# Patient Record
Sex: Male | Born: 1935
Health system: Southern US, Community
[De-identification: ages and names within clinical notes are randomized; demographics above are authoritative.]

## PROBLEM LIST (undated history)

## (undated) DIAGNOSIS — I2699 Other pulmonary embolism without acute cor pulmonale: Secondary | ICD-10-CM

## (undated) DIAGNOSIS — I10 Essential (primary) hypertension: Secondary | ICD-10-CM

## (undated) DIAGNOSIS — J189 Pneumonia, unspecified organism: Secondary | ICD-10-CM

## (undated) DIAGNOSIS — M549 Dorsalgia, unspecified: Secondary | ICD-10-CM

## (undated) DIAGNOSIS — K829 Disease of gallbladder, unspecified: Secondary | ICD-10-CM

## (undated) DIAGNOSIS — K255 Chronic or unspecified gastric ulcer with perforation: Secondary | ICD-10-CM

## (undated) DIAGNOSIS — K5903 Drug induced constipation: Secondary | ICD-10-CM

## (undated) DIAGNOSIS — Z974 Presence of external hearing-aid: Secondary | ICD-10-CM

## (undated) DIAGNOSIS — E119 Type 2 diabetes mellitus without complications: Secondary | ICD-10-CM

## (undated) DIAGNOSIS — G8929 Other chronic pain: Secondary | ICD-10-CM

## (undated) DIAGNOSIS — N289 Disorder of kidney and ureter, unspecified: Secondary | ICD-10-CM

## (undated) DIAGNOSIS — G4730 Sleep apnea, unspecified: Secondary | ICD-10-CM

## (undated) DIAGNOSIS — Z972 Presence of dental prosthetic device (complete) (partial): Secondary | ICD-10-CM

## (undated) DIAGNOSIS — F4310 Post-traumatic stress disorder, unspecified: Secondary | ICD-10-CM

## (undated) DIAGNOSIS — E7800 Pure hypercholesterolemia, unspecified: Secondary | ICD-10-CM

## (undated) DIAGNOSIS — Z973 Presence of spectacles and contact lenses: Secondary | ICD-10-CM

## (undated) DIAGNOSIS — K219 Gastro-esophageal reflux disease without esophagitis: Secondary | ICD-10-CM

## (undated) DIAGNOSIS — K08109 Complete loss of teeth, unspecified cause, unspecified class: Secondary | ICD-10-CM

## (undated) DIAGNOSIS — T402X5A Adverse effect of other opioids, initial encounter: Secondary | ICD-10-CM

## (undated) DIAGNOSIS — M1990 Unspecified osteoarthritis, unspecified site: Secondary | ICD-10-CM

## (undated) HISTORY — DX: Essential (primary) hypertension: I10

## (undated) HISTORY — DX: Chronic or unspecified gastric ulcer with perforation: K25.5

## (undated) HISTORY — PX: OTHER SURGICAL HISTORY: SHX169

## (undated) HISTORY — PX: ANKLE SURGERY: SHX546

## (undated) HISTORY — PX: COLONOSCOPY: SHX174

## (undated) HISTORY — DX: Pure hypercholesterolemia, unspecified: E78.00

## (undated) HISTORY — PX: BACK SURGERY: SHX140

## (undated) HISTORY — PX: ROTATOR CUFF REPAIR: SHX139

---
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
## 1999-07-05 ENCOUNTER — Emergency Department (HOSPITAL_COMMUNITY): Admission: EM | Admit: 1999-07-05 | Discharge: 1999-07-05 | Payer: Self-pay | Admitting: Emergency Medicine

## 2001-02-20 ENCOUNTER — Ambulatory Visit (HOSPITAL_BASED_OUTPATIENT_CLINIC_OR_DEPARTMENT_OTHER): Admission: RE | Admit: 2001-02-20 | Discharge: 2001-02-21 | Payer: Self-pay | Admitting: Orthopedic Surgery

## 2005-02-10 ENCOUNTER — Encounter: Admission: RE | Admit: 2005-02-10 | Discharge: 2005-02-10 | Payer: Self-pay | Admitting: Family Medicine

## 2005-05-03 ENCOUNTER — Encounter: Admission: RE | Admit: 2005-05-03 | Discharge: 2005-05-03 | Payer: Self-pay | Admitting: Orthopedic Surgery

## 2005-05-11 ENCOUNTER — Encounter (INDEPENDENT_AMBULATORY_CARE_PROVIDER_SITE_OTHER): Payer: Self-pay | Admitting: Specialist

## 2005-05-11 ENCOUNTER — Ambulatory Visit (HOSPITAL_COMMUNITY): Admission: RE | Admit: 2005-05-11 | Discharge: 2005-05-12 | Payer: Self-pay | Admitting: Orthopedic Surgery

## 2007-11-03 ENCOUNTER — Emergency Department (HOSPITAL_COMMUNITY): Admission: EM | Admit: 2007-11-03 | Discharge: 2007-11-03 | Payer: Self-pay | Admitting: Emergency Medicine

## 2008-11-12 ENCOUNTER — Encounter: Admission: RE | Admit: 2008-11-12 | Discharge: 2008-11-12 | Payer: Self-pay | Admitting: Orthopedic Surgery

## 2008-11-13 ENCOUNTER — Ambulatory Visit (HOSPITAL_BASED_OUTPATIENT_CLINIC_OR_DEPARTMENT_OTHER): Admission: RE | Admit: 2008-11-13 | Discharge: 2008-11-14 | Payer: Self-pay | Admitting: Orthopedic Surgery

## 2010-12-09 LAB — POCT I-STAT, CHEM 8
BUN: 39 mg/dL — ABNORMAL HIGH (ref 6–23)
Chloride: 109 mEq/L (ref 96–112)
Creatinine, Ser: 1.3 mg/dL (ref 0.4–1.5)
Glucose, Bld: 88 mg/dL (ref 70–99)
Hemoglobin: 14.6 g/dL (ref 13.0–17.0)
Sodium: 140 mEq/L (ref 135–145)
TCO2: 24 mmol/L (ref 0–100)

## 2010-12-09 LAB — BASIC METABOLIC PANEL
BUN: 32 mg/dL — ABNORMAL HIGH (ref 6–23)
CO2: 24 mEq/L (ref 19–32)
Calcium: 9.1 mg/dL (ref 8.4–10.5)
Chloride: 105 mEq/L (ref 96–112)

## 2011-01-11 NOTE — Op Note (Signed)
NAMEKNOXX, BAETZ               ACCOUNT NO.:  [id number]   MEDICAL RECORD NO.:  [id number]          PATIENT TYPE:  AMB   LOCATION:  [place]                          FACILITY:  [facility]   PHYSICIAN:  Youlanda Mighty. Sypher, M.D. DATE OF BIRTH:  06/10/1932   DATE OF PROCEDURE:  11/13/2008  DATE OF DISCHARGE:                               OPERATIVE REPORT   PREOPERATIVE DIAGNOSES:  Chronic left shoulder pain due to a number of  pathologies including acromioclavicular degenerative arthritis, adhesive  capsulitis and bursitis, labral degenerative tear, partial-thickness  rotator cuff tear involving the supraspinatus tendon and rule out  bicipital tendinopathy.   POSTOPERATIVE DIAGNOSES:  1. Confirmed labral degenerative tearing with a superior labrum      anterior and posterior I type tear.  2. A 30% superior subscapularis degenerative tear.  3. A 50% mid substance watershed partial-thickness articular-surface      tendon avulsion tear of supraspinatus.  4. Adhesive capsulitis with tenodesis of subscapularis.  5. Florid subacromial bursitis with unfavorable acromial and      acromioclavicular anatomy.   OPERATION:  1. Examination of left shoulder under anesthesia.  2. Diagnostic arthroscopy followed by arthroscopic debridement of      labrum, subscapularis, supraspinatus and tenolysis of subscapularis      and anterior capsulectomy.  3. Arthroscopic subacromial decompression with bursectomy,      coracoacromial ligament relaxation, and acromioplasty.  4. Arthroscopic resection of distal clavicle.   OPERATIONS:  Youlanda Mighty. Sypher, MD   ASSISTANT:  Marily Lente Dasnoit, PA-C   ANESTHESIA:  General endotracheal supplemented by a left interscalene  block.   SUPERVISING ANESTHESIOLOGIST:  Dr. Glennon Mac.   INDICATIONS:  Jaidan Fagot is a 77 year old gentleman referred through  the courtesy of Dr. Juanita Craver for evaluation and management of left  shoulder pain.  He is status post decompression  of the right shoulder in  2002.  Mr. Garneau has a number of coexisting pathologies including  diabetes, probable sleep apnea, chronic degenerative arthritis,  hypertension, and atherosclerotic cardiovascular disease.  He has never  been a smoker.  He had progressive left shoulder pain.  Plain films and  MRI documented AC arthropathy, unfavorable AC contour, and reactive  changes of the greater tuberosity consistent with a rotator cuff tear.  The MRI confirmed a partial-thickness rotator cuff tear, subscapularis  tendinopathy, and bicipital tendinopathy.   Due to failed respond to nonoperative measures, he is brought to the  operating room at this time anticipating diagnostic arthroscopy,  debridement, subacromial decompression, distal clavicle resection, and  possible repair of the rotator cuff.   We informed Mr. Whitted that we would correct all identified  intraarticular pathology at the time of surgery.   He understood that we might proceed with an open rotator cuff repair if  a full-thickness tear was identified.   Questions are invited and answered in detail.   Preoperatively, he was seen for an anesthesia consult by Dr. Glennon Mac.  His multiple morbidities were assessed and an interscalene block was  placed without complication.   He was brought to room #8 of the [facility], placed in the  supine position upon operating table, and under Dr. Marisue Brooklyn direct  supervision general endotracheal anesthesia was induced.  He was  carefully positioned in the beach-chair position with the aid of a torso  and head holder designed for shoulder arthroscopy.  The entire left  upper extremity and forequarter were prepped with DuraPrep and draped  with impervious arthroscopy drapes.  Examination of shoulder under  anesthesia revealed stability.  His combined elevation was 160 degrees,  external rotation 75, and internal rotation of 50.  He had mild adhesive  capsulitis, most notable  with external rotation and abduction above 90  degrees.   The arthroscope was introduced with a switching stick from an anterior  approach with the scope being placed in the posterior viewing portal.  Diagnostic arthroscopy revealed abundant granulation tissue consistent  with adhesive capsulitis and considerable adhesions of the subscapularis  tendon.  The biceps had a partial tear.  The subscapularis had a 30%  superior degenerative tear that was retracted and fibular.  The  supraspinatus had a 50% deep surface watershed tear.  This was not full  thickness.  There was no significant pathology of the infraspinatus or  teres minor.   An anterior portal was created under direct vision followed by use of a  suction shaver to debride the labrum to a stable margin.  The biceps to  a stable margin of subscapularis to stable fiber and the deep surface of  supraspinatus.  There was not a significant degree of glenohumeral  degenerative arthritis noted.  An anterior capsulotomy was performed  with the cutting cautery followed by suction shaving of the redundant  tissues and hemostasis with bipolar cautery.  This allowed much freer  motion of the subscapularis and external rotation was recovered.  The  scope was removed from the glenohumeral joint and placed in the  subacromial space.  A florid bursitis was identified.  After extensive  bursal debridement, the anatomy of the coracoacromial arch was studied.  The capsule of the [facility] joint had been violated and was prominent abrading  the rotator cuff.  The capsule was removed with the cutting cautery  followed by debridement with a suction shaver and resection of the  distal 15 mm clavicle with suction bur.  The acromion was leveled to a  type 1 morphology and the coracoacromial ligament was relaxed.  Hemostasis was achieved with bipolar cautery.  After a thorough  bursectomy, the bursal side of the cuff was inspected.  There was no  sign of  full-thickness tear.   After hemostasis was achieved, the arthroscopic probe was removed.  Mr.  Benzing was placed in a sling, awakened from general anesthesia, and  transferred to recovery room with stable signs.   Due to his multiple pathologies, we will observe him overnight on  supplemental oxygen in the [place].  There were no apparent  complications.     Youlanda Mighty Sypher, M.D.  Electronically Signed    RVS/MEDQ  D:  11/13/2008  T:  11/14/2008  Job:  [id number]   cc:   Juanita Craver, M.D.

## 2011-01-14 NOTE — Op Note (Signed)
[name], [name]               ACCOUNT NO.:  [id number]   MEDICAL RECORD NO.:  [id number]          PATIENT TYPE:  OIB   LOCATION:  [id number]                         FACILITY:  [facility]   PHYSICIAN:  Tarri Glenn, M.D.  DATE OF BIRTH:  1932-04-06   DATE OF PROCEDURE:  05/11/2005  DATE OF DISCHARGE:                                 OPERATIVE REPORT   PREOPERATIVE DIAGNOSIS:  Lateral recess stenosis and herniated nucleus  pulposus L4-5 status post two prior back surgeries with central  decompression at L4-5.   POSTERIOR DIAGNOSIS:  Lateral recess stenosis and herniated nucleus pulposus  L4-5 status post two prior back surgeries with central decompression at L4-  5.   OPERATION:  Decompression lateral recess and excision of herniated nucleus  pulposus at L4-5 left.   SURGEON:  Tarri Glenn, M.D.   ASSISTANT:  Susa Day, M.D.   ANESTHESIA:  General.   INDICATION FOR PROCEDURE:  The two prior back surgeries were performed in  1980 and 1991. More recently, he has had severe back and left leg pain. A  myelogram CT scan has demonstrated a significant deformation of the column  at L4-5 on the left associated with suspected HNP at L4-5 on the CT scan.  Accordingly, he is here for the above-mentioned surgery.   PROCEDURE:  Prophylactic antibiotic, satisfactory general anesthesia, knee-  chest position on the Foley frame. Back was prepped with DuraPrep, draped  in a sterile field. I went through the old surgical incision and, based on  the CT scan where he had had central decompression with still a little  remnant of L4 remaining, I tagged with a Kocher clamp what we thought was  the residual spinous process of L4 and put another spine clamp at the mid  portion of my incision. A lateral x-ray was taken indicating that we were  indeed at the inferior margin of the previous central decompression at L4  and that the Kocher clamp in the mid portion of the incision was at the L4-5  disk space. I placed self-retaining McCullough retractors after dissecting  soft tissue adequately to either side and, working off the remaining  residual bone at L4 with curette and double-action rongeur, we worked down  the lateral recess of bone. We were able to eventually in several areas work  through the previous decompression margin with a 2 mm Kerrison rongeur and  began decompressing the lateral recess. Also used 1/4 and 1/2 inch  osteotomes for additional bone removal. We eventually located the L5 nerve  root. The major defect was right at the level of the interspace. After  removing enough lateral bone and packed all we could really get at because  we were at the level the pedicle, were able to identify the L5 nerve root,  free it up in the foramen, and locate beneath it a large boil-type appearing  disk fragment, perhaps even the disk itself. I was able to milk this out  with Penfield-4 and micro pituitary removing a considerable amount of disk  material which left the L5 nerve root lying free and the underlying  disk  appearing to be very firm. At this point, we were satisfied that we had  thoroughly decompressed the lateral recess, the L5 nerve root, and remove  the offending disk material. The wound was irrigated well with sterile  saline. There was no unusual bleeding and no dural tear. He was given 30 mg  of Toradol IV. I closed the fascia with interrupted #1 Vicryl, as well as  the deep subcutaneous tissue, superficial subcu tissue was infiltrated with  0.5% plain Marcaine, and continued the  closure with 2-0 Vicryl superficially and staples in the skin. Betadine and  Adaptic dressing were applied. He was gently placed on his PACU bed and  taken to the recovery room in satisfactory condition with no known  complications.           ______________________________  Tarri Glenn, M.D.     JA/MEDQ  D:  05/11/2005  T:  05/12/2005  Job:  [id number]

## 2011-01-14 NOTE — Op Note (Signed)
[place]  Patient:    [name], [name]                      MRN: [id number] Proc. Date: 02/20/01 Adm. Date:  [id number] Attending:  Isaac Laud                           Operative Report  PREOPERATIVE DIAGNOSIS:  Chronic pain, right shoulder with clinical examination consistent with internal derangement and early degenerative arthritis, right glenohumeral joint with chronic stage 2 impingement and acromioclavicular arthropathy.  POSTOPERATIVE DIAGNOSIS:  Chronic pain, right shoulder with clinical examination consistent with internal derangement and early degenerative arthritis, right glenohumeral joint with chronic stage 2 impingement and acromioclavicular arthropathy.  Identification of a large anterior labral tear impinging within the joint, grade 2 and 3 chondromalacia of the humeral head, grade 2 and 3 chondromalacia of glenoid fossa and intact rotator cuff and biceps tendon.  OPERATION PERFORMED: 1. Diagnostic arthroscopy, right glenohumeral joint followed by arthroscopic    debridement of extensive superior and inferior labral tear. 2. Subacromial decompression with acromioplasty, bursectomy and coracoacromial    ligament release. 3. Arthroscopic distal clavicle resection.  SURGEON:  Youlanda Mighty. Sypher, Brooke Bonito., M.D.  ASSISTANT:  Julian Reil, P.A.  ANESTHESIA:  General orotracheal.  SUPERVISING ANESTHESIOLOGIST:  Dr. Albertina Parr.  INDICATIONS FOR PROCEDURE:  The patient is a 77 year old man who presented for evaluation of chronic right shoulder pain.  Clinical examination revealed signs of impingement, acromioclavicular arthropathy and on plain films, he was noted to have glenohumeral joint degenerative change.  An MRI of the shoulder documented degenerative change in the glenohumeral joint and AC joints, a mass in the anterior glenohumeral joint adjacent to the superior labrum and biceps tendon consistent with a  possible cyst or degenerative labral tear. Arrangements were made for diagnostic arthroscopy at this time followed by appropriate intervention.  [name] was anticipating resection of the distal clavicle.  DESCRIPTION OF PROCEDURE:  [name] was brought to the operating room and placed in supine position on the operating table.  Following induction of general orotracheal anesthesia, he was carefully positioned in a beach chair position with the aid of torso and head holder designed for shoulder arthropathy.  The entire right upper extremity and forequarter were prepped with DuraPrep and draped in impervious arthroscopy drapes.  The arthroscope was introduced after distention of the shoulder joint with 20 cc of sterile saline.  The scope was introduced atraumatically through a posterior portal with blunt technique.  Diagnostic arthroscopy revealed the aforementioned grade 2 and 3 chondromalacia of the humeral head and glenoid. There is a large anterior labral tear extending to the biceps anchor to approximately 3 oclock adjacent to the subscapularis tendon.  There was no sign of a SLAP lesion.  The long head of the biceps was intact.  The deep surfaces of the supraspinatus and infraspinatus were visualized and found to be intact.  An anterior portal was created under direct vision and the labral tear fragments debrided to a stable rim.  The glenohumeral joint surfaces were evaluated and documented photographically.  Superior and inferior recesses were normal except for mild synovitis.  The scope was removed from the glenohumeral joint and placed in the subacromial space.  Florid bursitis was noted.  After a thorough bursectomy the morphology of the acromion was easily identified.  He had a type 3 acromion that was quite  prominent.  This was leveled to a type 1 morphology with a power bur brought in laterally and posteriorly.  The [facility] joint was easily visualized and the severe  degeneration of the meniscus and the articular surface of the distal clavicle documented photographically.  The distal 15 mm of clavicle was removed arthroscopically with a suction shaver brought in anteriorly and visualization from the posterior and lateral portal.  Hemostasis was achieved with bipolar cautery followed by further bursectomy and removal of all bony debris from the subacromial space.  Bleeding was controlled in the bursa with a cautery followed by thorough lavage of the subacromial space.  The arthroscopic equipment was removed and the portals repaired with interrupted sutures of 3-0 nylon.  0.25% Marcaine was infiltrated for postoperative analgesia.  [name] was awakened from anesthesia and transferred to the recovery room with stable vital signs.  He will be admitted to the [place] for observation of his vitals signs, glucose and appropriate analgesics overnight. DD:  02/20/01 TD:  02/20/01 Job: [id number]

## 2012-11-07 ENCOUNTER — Emergency Department (HOSPITAL_COMMUNITY): Payer: Medicare Other

## 2012-11-07 ENCOUNTER — Emergency Department (HOSPITAL_COMMUNITY)
Admission: EM | Admit: 2012-11-07 | Discharge: 2012-11-07 | Disposition: A | Discharge status: Home / Self Care | Payer: Medicare Other | Source: Home / Self Care | Attending: Emergency Medicine | Admitting: Emergency Medicine

## 2012-11-07 ENCOUNTER — Encounter (HOSPITAL_COMMUNITY): Payer: Self-pay | Admitting: *Deleted

## 2012-11-07 DIAGNOSIS — M549 Dorsalgia, unspecified: Secondary | ICD-10-CM | POA: Insufficient documentation

## 2012-11-07 DIAGNOSIS — Z79899 Other long term (current) drug therapy: Secondary | ICD-10-CM | POA: Insufficient documentation

## 2012-11-07 DIAGNOSIS — M25569 Pain in unspecified knee: Secondary | ICD-10-CM | POA: Insufficient documentation

## 2012-11-07 DIAGNOSIS — E119 Type 2 diabetes mellitus without complications: Secondary | ICD-10-CM | POA: Insufficient documentation

## 2012-11-07 DIAGNOSIS — M25552 Pain in left hip: Secondary | ICD-10-CM

## 2012-11-07 DIAGNOSIS — Z7982 Long term (current) use of aspirin: Secondary | ICD-10-CM | POA: Insufficient documentation

## 2012-11-07 DIAGNOSIS — G8929 Other chronic pain: Secondary | ICD-10-CM | POA: Insufficient documentation

## 2012-11-07 HISTORY — DX: Type 2 diabetes mellitus without complications: E11.9

## 2012-11-07 MED ORDER — HYDROMORPHONE HCL PF 1 MG/ML IJ SOLN
1.0000 mg | Freq: Once | INTRAMUSCULAR | Status: AC
  Administered 2012-11-07: 1 mg via INTRAMUSCULAR
  Filled 2012-11-07: qty 1

## 2012-11-07 MED ORDER — HYDROCODONE-ACETAMINOPHEN 5-325 MG PO TABS
2.0000 | ORAL_TABLET | Freq: Once | ORAL | Status: AC
  Administered 2012-11-07: 2 via ORAL
  Filled 2012-11-07: qty 2

## 2012-11-07 NOTE — ED Notes (Signed)
The pt is c/o back and hip pain chronically with more pain in the past 7 days.

## 2012-11-07 NOTE — ED Notes (Signed)
Pt. oob to the wheel chair with assist of 1.

## 2012-11-07 NOTE — ED Notes (Signed)
Pt presents lower back pain that is chronic in nature.  Pt went to [place] in February and was found to have bulging discs L4 and L5 and given Torodol for pain.  Pt states the pain has been worse for the past week and he can no longer tolerate the pain.  Denies any difficulty with bowel or bladder.  Will continue to monitor.

## 2012-11-07 NOTE — ED Provider Notes (Signed)
History     CSN: [id number]  Arrival date & time 11/07/12  0449   First MD Initiated Contact with Patient 11/07/12 [phone]      Chief Complaint  Patient presents with  . Back Pain    (Consider location/radiation/quality/duration/timing/severity/associated sxs/prior treatment) HPI Patient presents with hip pain.  The pain has been present for a long time but has become more pronounced in the past 7 days.  There is no new fever, vomiting, no diarrhea, no Incontinence, no nV weakness or dysesthesia.  The pain is sharp, radiates down the entire left leg.  The patient is not taking any narcotics for pain control.  He has been evaluated at the [facility] for this pain, and his current scheduled to see his orthopedist in 10 days. The patient has chronic pain in his back as well. The patient also denies any chest pain, dyspnea, confusion, disorientation, falling , weakness in an extremity.  Past Medical History  Diagnosis Date  . Diabetes mellitus without complication     History reviewed. No pertinent past surgical history.  No family history on file.  History  Substance Use Topics  . Smoking status: Never Smoker   . Smokeless tobacco: Not on file  . Alcohol Use: No      Review of Systems  Constitutional:       Per HPI, otherwise negative  HENT:       Per HPI, otherwise negative  Respiratory:       Per HPI, otherwise negative  Cardiovascular:       Per HPI, otherwise negative  Gastrointestinal: Negative for vomiting.  Endocrine:       Negative aside from HPI  Genitourinary:       Neg aside from HPI   Musculoskeletal:       Per HPI, otherwise negative  Skin: Negative.   Neurological: Negative for syncope.    Allergies  Review of patient's allergies indicates no known allergies.  Home Medications   Current Outpatient Rx  Name  Route  Sig  Dispense  Refill  . Ascorbic Acid (VITAMIN C) 1000 MG tablet   Oral   Take 1,000 mg by mouth daily.         Marland Kitchen aspirin EC 325 MG  tablet   Oral   Take 325 mg by mouth daily.         Marland Kitchen dextran 70-hypromellose (TEARS RENEWED) ophthalmic solution   Both Eyes   Place 1 drop into both eyes 2 (two) times daily.         . Ergocalciferol (VITAMIN D2) 2000 UNITS TABS   Oral   Take 1 tablet by mouth daily.         Marland Kitchen galantamine (RAZADYNE ER) 16 MG 24 hr capsule   Oral   Take 16 mg by mouth daily with breakfast.         . glyBURIDE (DIABETA) 5 MG tablet   Oral   Take 5-10 mg by mouth 2 (two) times daily with a meal. Take 2 tablets in the morning and 1 tablet in the evening         . hydrochlorothiazide (HYDRODIURIL) 25 MG tablet   Oral   Take 25 mg by mouth daily.         Marland Kitchen lisinopril (PRINIVIL,ZESTRIL) 20 MG tablet   Oral   Take 20 mg by mouth daily.         . metFORMIN (GLUCOPHAGE) 500 MG tablet   Oral   Take 500 mg  by mouth 2 (two) times daily with a meal.         . methocarbamol (ROBAXIN) 750 MG tablet   Oral   Take 750 mg by mouth 4 (four) times daily as needed (muscle relaxer).         . Multiple Vitamins-Minerals (ICAPS AREDS FORMULA PO)   Oral   Take 1 tablet by mouth 2 (two) times daily.         . Omega-3 Fatty Acids (FISH OIL) 1000 MG CAPS   Oral   Take 1 capsule by mouth daily.         . pioglitazone (ACTOS) 30 MG tablet   Oral   Take 30 mg by mouth daily.         . simvastatin (ZOCOR) 40 MG tablet   Oral   Take 40 mg by mouth every evening.         . traMADol (ULTRAM) 50 MG tablet   Oral   Take 50 mg by mouth every 8 (eight) hours as needed for pain.         . traZODone (DESYREL) 100 MG tablet   Oral   Take 100 mg by mouth at bedtime.         Marland Kitchen venlafaxine XR (EFFEXOR-XR) 150 MG 24 hr capsule   Oral   Take 150 mg by mouth daily.           BP 133/81  Pulse 146  Temp(Src) 97.7 F (36.5 C) (Oral)  Resp 17  SpO2 99%  Physical Exam  Nursing note and vitals reviewed. Constitutional: He is oriented to person, place, and time. He appears  well-developed. No distress.  HENT:  Head: Normocephalic and atraumatic.  Eyes: Conjunctivae and EOM are normal.  Cardiovascular: Normal rate and regular rhythm.   Pulmonary/Chest: Effort normal. No stridor. No respiratory distress.  Abdominal: He exhibits no distension.  Musculoskeletal: He exhibits no edema.       Legs: Neurological: He is alert and oriented to person, place, and time.  Skin: Skin is warm and dry.  Psychiatric: He has a normal mood and affect.    ED Course  Procedures (including critical care time)  Labs Reviewed - No data to display Dg Hip Complete Left  11/07/2012  *RADIOLOGY REPORT*  Clinical Data: Left hip pain  LEFT HIP - COMPLETE 2+ VIEW  Comparison: None.  Findings: Mild to moderate degenerative changes of the left hip. No displaced fracture.  No dislocation.  No aggressive osseous lesions.  IMPRESSION: Mild to moderate left hip DJD.  No acute osseous finding.   Original Report Authenticated By: Carlos Levering, M.D.      No diagnosis found.  The patient has paper printout of prior radiographic studies for review.   MDM  This pleasant elderly male presents with worsening left hip pain, prior studies demonstrating degenerative joint disease.  Today's evaluation demonstrates that the patient is neurovascularly intact, and although he is in pain, absent trauma, absent fever, absent warmth or overt signs of septic joint, the patient is stable for outpatient reevaluation by orthopedics.  The patient had analgesics provided here, was discharged with a short course of narcotics.  I arranged a follow-up visit for the patient in four hours with his orthopedists.    Carmin Muskrat, MD 11/07/12 [phone]

## 2012-11-09 ENCOUNTER — Encounter (HOSPITAL_COMMUNITY): Payer: Self-pay | Admitting: Nurse Practitioner

## 2012-11-09 ENCOUNTER — Inpatient Hospital Stay (HOSPITAL_COMMUNITY): Payer: Medicare Other

## 2012-11-09 ENCOUNTER — Inpatient Hospital Stay (HOSPITAL_COMMUNITY)
Admission: EM | Admit: 2012-11-09 | Discharge: 2012-11-21 | DRG: 490 | Disposition: A | Discharge status: Skilled Nursing Facility | Payer: Medicare Other | Attending: Internal Medicine | Admitting: Internal Medicine

## 2012-11-09 ENCOUNTER — Other Ambulatory Visit: Payer: Self-pay | Admitting: Orthopedic Surgery

## 2012-11-09 DIAGNOSIS — R404 Transient alteration of awareness: Secondary | ICD-10-CM | POA: Diagnosis present

## 2012-11-09 DIAGNOSIS — M5430 Sciatica, unspecified side: Secondary | ICD-10-CM | POA: Diagnosis present

## 2012-11-09 DIAGNOSIS — N179 Acute kidney failure, unspecified: Secondary | ICD-10-CM | POA: Diagnosis not present

## 2012-11-09 DIAGNOSIS — Z7982 Long term (current) use of aspirin: Secondary | ICD-10-CM

## 2012-11-09 DIAGNOSIS — E669 Obesity, unspecified: Secondary | ICD-10-CM | POA: Diagnosis present

## 2012-11-09 DIAGNOSIS — M5417 Radiculopathy, lumbosacral region: Secondary | ICD-10-CM

## 2012-11-09 DIAGNOSIS — R141 Gas pain: Secondary | ICD-10-CM | POA: Diagnosis present

## 2012-11-09 DIAGNOSIS — K255 Chronic or unspecified gastric ulcer with perforation: Secondary | ICD-10-CM

## 2012-11-09 DIAGNOSIS — R142 Eructation: Secondary | ICD-10-CM | POA: Diagnosis present

## 2012-11-09 DIAGNOSIS — G8929 Other chronic pain: Secondary | ICD-10-CM | POA: Diagnosis present

## 2012-11-09 DIAGNOSIS — I10 Essential (primary) hypertension: Secondary | ICD-10-CM

## 2012-11-09 DIAGNOSIS — M1610 Unilateral primary osteoarthritis, unspecified hip: Secondary | ICD-10-CM | POA: Diagnosis present

## 2012-11-09 DIAGNOSIS — M47817 Spondylosis without myelopathy or radiculopathy, lumbosacral region: Secondary | ICD-10-CM | POA: Diagnosis present

## 2012-11-09 DIAGNOSIS — F4310 Post-traumatic stress disorder, unspecified: Secondary | ICD-10-CM | POA: Diagnosis present

## 2012-11-09 DIAGNOSIS — S3421XA Injury of nerve root of lumbar spine, initial encounter: Secondary | ICD-10-CM

## 2012-11-09 DIAGNOSIS — Z6841 Body Mass Index (BMI) 40.0 and over, adult: Secondary | ICD-10-CM

## 2012-11-09 DIAGNOSIS — R400 Somnolence: Secondary | ICD-10-CM

## 2012-11-09 DIAGNOSIS — M48061 Spinal stenosis, lumbar region without neurogenic claudication: Secondary | ICD-10-CM

## 2012-11-09 DIAGNOSIS — F17200 Nicotine dependence, unspecified, uncomplicated: Secondary | ICD-10-CM | POA: Diagnosis present

## 2012-11-09 DIAGNOSIS — Z9889 Other specified postprocedural states: Secondary | ICD-10-CM

## 2012-11-09 DIAGNOSIS — IMO0002 Reserved for concepts with insufficient information to code with codable children: Secondary | ICD-10-CM | POA: Diagnosis present

## 2012-11-09 DIAGNOSIS — M169 Osteoarthritis of hip, unspecified: Secondary | ICD-10-CM | POA: Diagnosis present

## 2012-11-09 DIAGNOSIS — R944 Abnormal results of kidney function studies: Secondary | ICD-10-CM | POA: Diagnosis present

## 2012-11-09 DIAGNOSIS — I959 Hypotension, unspecified: Secondary | ICD-10-CM

## 2012-11-09 DIAGNOSIS — E119 Type 2 diabetes mellitus without complications: Secondary | ICD-10-CM

## 2012-11-09 DIAGNOSIS — Z79899 Other long term (current) drug therapy: Secondary | ICD-10-CM

## 2012-11-09 DIAGNOSIS — R7989 Other specified abnormal findings of blood chemistry: Secondary | ICD-10-CM

## 2012-11-09 DIAGNOSIS — D72829 Elevated white blood cell count, unspecified: Secondary | ICD-10-CM

## 2012-11-09 DIAGNOSIS — M5432 Sciatica, left side: Secondary | ICD-10-CM

## 2012-11-09 DIAGNOSIS — M549 Dorsalgia, unspecified: Secondary | ICD-10-CM

## 2012-11-09 DIAGNOSIS — K5900 Constipation, unspecified: Secondary | ICD-10-CM

## 2012-11-09 DIAGNOSIS — M5126 Other intervertebral disc displacement, lumbar region: Principal | ICD-10-CM

## 2012-11-09 DIAGNOSIS — R739 Hyperglycemia, unspecified: Secondary | ICD-10-CM

## 2012-11-09 HISTORY — DX: Post-traumatic stress disorder, unspecified: F43.10

## 2012-11-09 LAB — POCT I-STAT, CHEM 8
HCT: 43 % (ref 39.0–52.0)
Hemoglobin: 14.6 g/dL (ref 13.0–17.0)
Potassium: 3.9 mEq/L (ref 3.5–5.1)
Sodium: 139 mEq/L (ref 135–145)
TCO2: 29 mmol/L (ref 0–100)

## 2012-11-09 LAB — GLUCOSE, CAPILLARY
Glucose-Capillary: 78 mg/dL (ref 70–99)
Glucose-Capillary: 91 mg/dL (ref 70–99)

## 2012-11-09 MED ORDER — HYDROMORPHONE HCL PF 1 MG/ML IJ SOLN
0.5000 mg | INTRAMUSCULAR | Status: DC | PRN
  Administered 2012-11-09 – 2012-11-10 (×3): 0.5 mg via INTRAVENOUS
  Filled 2012-11-09 (×3): qty 1

## 2012-11-09 MED ORDER — ENOXAPARIN SODIUM 40 MG/0.4ML ~~LOC~~ SOLN: 40.0000 mg | SUBCUTANEOUS | Status: DC

## 2012-11-09 MED ORDER — LISINOPRIL 20 MG PO TABS
20.0000 mg | ORAL_TABLET | Freq: Every day | ORAL | Status: DC
  Administered 2012-11-10 – 2012-11-15 (×6): 20 mg via ORAL
  Filled 2012-11-09 (×6): qty 1

## 2012-11-09 MED ORDER — HYDROMORPHONE HCL PF 1 MG/ML IJ SOLN: 1.0000 mg | INTRAMUSCULAR | Status: DC | PRN

## 2012-11-09 MED ORDER — GALANTAMINE HYDROBROMIDE ER 16 MG PO CP24
16.0000 mg | ORAL_CAPSULE | Freq: Every day | ORAL | Status: DC
  Filled 2012-11-09: qty 1

## 2012-11-09 MED ORDER — INSULIN ASPART 100 UNIT/ML ~~LOC~~ SOLN
0.0000 [IU] | Freq: Three times a day (TID) | SUBCUTANEOUS | Status: DC
  Administered 2012-11-10: 3 [IU] via SUBCUTANEOUS
  Administered 2012-11-11: 4 [IU] via SUBCUTANEOUS
  Administered 2012-11-11: 3 [IU] via SUBCUTANEOUS
  Administered 2012-11-11: 4 [IU] via SUBCUTANEOUS
  Administered 2012-11-12: 3 [IU] via SUBCUTANEOUS
  Administered 2012-11-12: 5 [IU] via SUBCUTANEOUS
  Administered 2012-11-12: 3 [IU] via SUBCUTANEOUS

## 2012-11-09 MED ORDER — ONDANSETRON HCL 4 MG/2ML IJ SOLN
4.0000 mg | Freq: Once | INTRAMUSCULAR | Status: AC
  Administered 2012-11-09: 4 mg via INTRAVENOUS
  Filled 2012-11-09: qty 2

## 2012-11-09 MED ORDER — HYDROMORPHONE HCL PF 1 MG/ML IJ SOLN
1.0000 mg | Freq: Once | INTRAMUSCULAR | Status: AC
  Administered 2012-11-09: 1 mg via INTRAVENOUS
  Filled 2012-11-09: qty 1

## 2012-11-09 MED ORDER — SENNA 8.6 MG PO TABS
1.0000 | ORAL_TABLET | Freq: Two times a day (BID) | ORAL | Status: DC
  Administered 2012-11-09 – 2012-11-13 (×8): 8.6 mg via ORAL
  Filled 2012-11-09 (×8): qty 1

## 2012-11-09 MED ORDER — METHOCARBAMOL 500 MG PO TABS
750.0000 mg | ORAL_TABLET | Freq: Four times a day (QID) | ORAL | Status: DC | PRN
  Administered 2012-11-09 – 2012-11-13 (×9): 750 mg via ORAL
  Filled 2012-11-09: qty 1.5
  Filled 2012-11-09 (×5): qty 1
  Filled 2012-11-09: qty 1.5
  Filled 2012-11-09: qty 2
  Filled 2012-11-09 (×3): qty 1

## 2012-11-09 MED ORDER — VENLAFAXINE HCL ER 150 MG PO CP24
150.0000 mg | ORAL_CAPSULE | Freq: Every day | ORAL | Status: DC
  Administered 2012-11-09 – 2012-11-17 (×8): 150 mg via ORAL
  Filled 2012-11-09 (×9): qty 1

## 2012-11-09 MED ORDER — HYDROCODONE-ACETAMINOPHEN 10-325 MG PO TABS
2.0000 | ORAL_TABLET | ORAL | Status: DC | PRN
  Administered 2012-11-09 – 2012-11-11 (×6): 2 via ORAL
  Filled 2012-11-09 (×6): qty 2

## 2012-11-09 MED ORDER — PIOGLITAZONE HCL 30 MG PO TABS
30.0000 mg | ORAL_TABLET | Freq: Every day | ORAL | Status: DC
  Filled 2012-11-09: qty 1

## 2012-11-09 MED ORDER — HYDROCHLOROTHIAZIDE 25 MG PO TABS
25.0000 mg | ORAL_TABLET | Freq: Every day | ORAL | Status: DC
  Administered 2012-11-10 – 2012-11-15 (×6): 25 mg via ORAL
  Filled 2012-11-09 (×6): qty 1

## 2012-11-09 MED ORDER — INSULIN ASPART 100 UNIT/ML ~~LOC~~ SOLN
0.0000 [IU] | Freq: Every day | SUBCUTANEOUS | Status: DC
  Administered 2012-11-13: 2 [IU] via SUBCUTANEOUS

## 2012-11-09 MED ORDER — GALANTAMINE HYDROBROMIDE 4 MG PO TABS
8.0000 mg | ORAL_TABLET | Freq: Two times a day (BID) | ORAL | Status: DC
  Administered 2012-11-10 – 2012-11-17 (×15): 8 mg via ORAL
  Filled 2012-11-09 (×18): qty 2

## 2012-11-09 MED ORDER — TEARS RENEWED OP SOLN: 1.0000 [drp] | Freq: Two times a day (BID) | OPHTHALMIC | Status: DC

## 2012-11-09 MED ORDER — GLYBURIDE 5 MG PO TABS
10.0000 mg | ORAL_TABLET | Freq: Every day | ORAL | Status: DC
  Filled 2012-11-09 (×2): qty 2

## 2012-11-09 MED ORDER — TRAZODONE HCL 100 MG PO TABS
100.0000 mg | ORAL_TABLET | Freq: Every day | ORAL | Status: DC
  Administered 2012-11-09 – 2012-11-16 (×8): 100 mg via ORAL
  Filled 2012-11-09 (×9): qty 1

## 2012-11-09 MED ORDER — ENOXAPARIN SODIUM 80 MG/0.8ML ~~LOC~~ SOLN
70.0000 mg | SUBCUTANEOUS | Status: DC
  Administered 2012-11-09: 70 mg via SUBCUTANEOUS
  Administered 2012-11-10 – 2012-11-11 (×2): via SUBCUTANEOUS
  Administered 2012-11-12 – 2012-11-14 (×3): 70 mg via SUBCUTANEOUS
  Filled 2012-11-09 (×7): qty 0.8

## 2012-11-09 MED ORDER — SODIUM CHLORIDE 0.9 % IV SOLN
Freq: Once | INTRAVENOUS | Status: AC
  Administered 2012-11-09: 13:00:00 via INTRAVENOUS

## 2012-11-09 MED ORDER — ASPIRIN EC 325 MG PO TBEC
325.0000 mg | DELAYED_RELEASE_TABLET | Freq: Every day | ORAL | Status: DC
  Administered 2012-11-09 – 2012-11-17 (×9): 325 mg via ORAL
  Filled 2012-11-09 (×9): qty 1

## 2012-11-09 MED ORDER — SIMVASTATIN 40 MG PO TABS
40.0000 mg | ORAL_TABLET | Freq: Every day | ORAL | Status: DC
  Administered 2012-11-09 – 2012-11-17 (×8): 40 mg via ORAL
  Filled 2012-11-09 (×9): qty 1

## 2012-11-09 MED ORDER — ONDANSETRON HCL 4 MG PO TABS: 4.0000 mg | ORAL_TABLET | Freq: Four times a day (QID) | ORAL | Status: DC | PRN

## 2012-11-09 MED ORDER — ONDANSETRON HCL 4 MG/2ML IJ SOLN
4.0000 mg | Freq: Four times a day (QID) | INTRAMUSCULAR | Status: DC | PRN
  Filled 2012-11-09 (×2): qty 2

## 2012-11-09 MED ORDER — GLYBURIDE 5 MG PO TABS: 5.0000 mg | ORAL_TABLET | Freq: Every day | ORAL | Status: DC

## 2012-11-09 MED ORDER — ONDANSETRON HCL 4 MG/2ML IJ SOLN: 4.0000 mg | Freq: Three times a day (TID) | INTRAMUSCULAR | Status: DC | PRN

## 2012-11-09 MED ORDER — POLYVINYL ALCOHOL 1.4 % OP SOLN
1.0000 [drp] | Freq: Two times a day (BID) | OPHTHALMIC | Status: DC
  Administered 2012-11-09 – 2012-11-20 (×20): 1 [drp] via OPHTHALMIC
  Filled 2012-11-09: qty 15

## 2012-11-09 MED ORDER — GLYBURIDE 5 MG PO TABS
5.0000 mg | ORAL_TABLET | Freq: Every day | ORAL | Status: DC
  Administered 2012-11-09: 5 mg via ORAL
  Filled 2012-11-09 (×2): qty 1

## 2012-11-09 NOTE — Progress Notes (Signed)
Patient admitted to [place], transferred from ED on hospital bed, tolerated well, transported by Primary Nurse, BP=>143/73,P=>76,Temp=>97.5,Sats=>95% on 2L/[place], patient c/o pain 10/10 on scale of 1-10, Dr notified of patient's arrival to unit, patient in stable condition at this time

## 2012-11-09 NOTE — ED Notes (Signed)
[id number] Expected date:<BR> Expected time:<BR> Means of arrival:<BR> Comments:<BR> 80yo-m lower back pain

## 2012-11-09 NOTE — ED Notes (Signed)
Per EMS: Lives at home with wife. Prior hx of back surgeries x 3.  Here in ER last week with back pain and was referred to ortho MD; pt will go on 11/19/12.  Past week, pain is becoming more intense each day.  Now the pain is radiating to left leg and he cannot bear weight on left leg...thus, the reason calling 911. Pain is 10/10.  Hx of high blood pressure (210/98, 78, 18), DM and back surgeries.  Taking Toradol and Hydrocodone: last taken this morning.  Wife and son are en route to be with patient.

## 2012-11-09 NOTE — ED Notes (Signed)
Spoke with Hospitalist: keep pt KVO.

## 2012-11-09 NOTE — ED Notes (Signed)
Ysidro Evert, RN transporting pt to 1520.  RN called wife to let her know room number.
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
## 2012-11-09 NOTE — ED Provider Notes (Signed)
History     CSN: [id number]  Arrival date & time 11/09/12  1150   First MD Initiated Contact with Patient 11/09/12 1206      Chief Complaint  Patient presents with  . Back Pain    Radiating to left leg and feet  . Leg Pain    Left      HPI Per EMS: Lives at home with wife. Prior hx of back surgeries x 3. Here in ER last week with back pain and was referred to ortho MD; pt will go on 11/19/12. Past week, pain is becoming more intense each day. Now the pain is radiating to left leg and he cannot bear weight on left leg...thus, the reason calling 911. Pain is 10/10. Hx of high blood pressure (210/98, 78, 18), DM and back surgeries. Taking Toradol and Hydrocodone: last taken this morning. Wife and son are en route to be with patient.  Past Medical History  Diagnosis Date  . Diabetes mellitus without complication   . Post traumatic stress disorder (PTSD)     [place] War    Past Surgical History  Procedure Laterality Date  . Back surgery      x 3    History reviewed. No pertinent family history.  History  Substance Use Topics  . Smoking status: Never Smoker   . Smokeless tobacco: Current User  . Alcohol Use: No      Review of Systems All other systems reviewed and are negative Allergies  Review of patient's allergies indicates no known allergies.  Home Medications   Current Outpatient Rx  Name  Route  Sig  Dispense  Refill  . Ascorbic Acid (VITAMIN C) 1000 MG tablet   Oral   Take 1,000 mg by mouth daily.         Marland Kitchen aspirin 325 MG EC tablet   Oral   Take 325 mg by mouth daily.         Marland Kitchen dextran 70-hypromellose (TEARS RENEWED) ophthalmic solution   Both Eyes   Place 1 drop into both eyes 2 (two) times daily.         . Ergocalciferol (VITAMIN D2) 2000 UNITS TABS   Oral   Take 1 tablet by mouth at bedtime.          Marland Kitchen galantamine (RAZADYNE ER) 16 MG 24 hr capsule   Oral   Take 16 mg by mouth daily with breakfast.         . glyBURIDE (DIABETA) 5 MG  tablet   Oral   Take 5 mg by mouth daily with breakfast. Take 2 tablets in the morning and 1 tablet in the evening         . hydrochlorothiazide (HYDRODIURIL) 25 MG tablet   Oral   Take 25 mg by mouth daily.         Marland Kitchen HYDROcodone-acetaminophen (NORCO) 10-325 MG per tablet   Oral   Take 2 tablets by mouth every 4 (four) hours as needed for pain.         Marland Kitchen ketorolac (TORADOL) 10 MG tablet   Oral   Take 10 mg by mouth every 6 (six) hours. Scheduled.         Marland Kitchen lisinopril (PRINIVIL,ZESTRIL) 20 MG tablet   Oral   Take 20 mg by mouth daily.         . metFORMIN (GLUCOPHAGE) 500 MG tablet   Oral   Take 1,000 mg by mouth 2 (two) times daily with a meal.          .  methocarbamol (ROBAXIN) 750 MG tablet   Oral   Take 750 mg by mouth 4 (four) times daily as needed (muscle relaxer).         . Multiple Vitamins-Minerals (ICAPS AREDS FORMULA PO)   Oral   Take 1 tablet by mouth 2 (two) times daily.         . Omega-3 Fatty Acids (FISH OIL) 1000 MG CAPS   Oral   Take 4 capsules by mouth daily.          . pioglitazone (ACTOS) 30 MG tablet   Oral   Take 30 mg by mouth daily.         . simvastatin (ZOCOR) 40 MG tablet   Oral   Take 40 mg by mouth every evening.         . traMADol (ULTRAM) 50 MG tablet   Oral   Take 100 mg by mouth every 6 (six) hours as needed for pain.          . traZODone (DESYREL) 100 MG tablet   Oral   Take 100 mg by mouth at bedtime.         Marland Kitchen venlafaxine XR (EFFEXOR-XR) 150 MG 24 hr capsule   Oral   Take 150 mg by mouth daily after supper.          . vitamin B-12 (CYANOCOBALAMIN) 1000 MCG tablet   Oral   Take 1,000 mcg by mouth every 30 (thirty) days.           BP 153/85  Pulse 78  Temp(Src) 98.1 F (36.7 C) (Oral)  Resp 22  SpO2 94%  Physical Exam  Nursing note and vitals reviewed. Constitutional: He is oriented to person, place, and time. He appears well-developed and well-nourished. He appears distressed (From  pain).  HENT:  Head: Normocephalic and atraumatic.  Eyes: Pupils are equal, round, and reactive to light.  Neck: Normal range of motion.  Cardiovascular: Normal rate and intact distal pulses.   Pulmonary/Chest: No respiratory distress.  Abdominal: Normal appearance. He exhibits no distension.  Musculoskeletal:       Lumbar back: He exhibits pain and spasm.       Back:  Neurological: He is alert and oriented to person, place, and time. No cranial nerve deficit.  Skin: Skin is warm and dry. No rash noted.  Psychiatric: He has a normal mood and affect. His behavior is normal.    ED Course  Procedures (including critical care time)  Labs Reviewed  POCT I-STAT, CHEM 8 - Abnormal; Notable for the following:    BUN 38 (*)    Glucose, Bld 146 (*)    All other components within normal limits   No results found.   1. Sciatica, left   2. Hypertension       MDM  Discuss case with Dr. Rolena Infante who recommended admission to medicine with or without consult if needed.       Dot Lanes, MD 11/09/12 1350

## 2012-11-09 NOTE — ED Notes (Signed)
[name] [phone] (cell for wife): please call wife when pt is moved to inpatient room.

## 2012-11-10 DIAGNOSIS — E669 Obesity, unspecified: Secondary | ICD-10-CM

## 2012-11-10 DIAGNOSIS — I10 Essential (primary) hypertension: Secondary | ICD-10-CM

## 2012-11-10 LAB — BASIC METABOLIC PANEL
CO2: 30 mEq/L (ref 19–32)
Calcium: 9.2 mg/dL (ref 8.4–10.5)
GFR calc non Af Amer: 62 mL/min — ABNORMAL LOW (ref >90)
Glucose, Bld: 62 mg/dL — ABNORMAL LOW (ref 70–99)
Potassium: 4 mEq/L (ref 3.5–5.1)
Sodium: 139 mEq/L (ref 135–145)

## 2012-11-10 LAB — CBC
Hemoglobin: 13.3 g/dL (ref 13.0–17.0)
MCH: 28.9 pg (ref 26.0–34.0)
MCV: 90 fL (ref 78.0–100.0)
Platelets: 320 10*3/uL (ref 150–400)
RBC: 4.6 MIL/uL (ref 4.22–5.81)
WBC: 10.6 10*3/uL — ABNORMAL HIGH (ref 4.0–10.5)

## 2012-11-10 LAB — HEMOGLOBIN A1C: Mean Plasma Glucose: 151 mg/dL — ABNORMAL HIGH (ref <117)

## 2012-11-10 LAB — GLUCOSE, CAPILLARY
Glucose-Capillary: 88 mg/dL (ref 70–99)
Glucose-Capillary: 137 mg/dL — ABNORMAL HIGH (ref 70–99)

## 2012-11-10 MED ORDER — METHYLPREDNISOLONE 4 MG PO KIT
4.0000 mg | PACK | Freq: Three times a day (TID) | ORAL | Status: AC
  Administered 2012-11-11 (×3): 4 mg via ORAL

## 2012-11-10 MED ORDER — DOCUSATE SODIUM 100 MG PO CAPS
200.0000 mg | ORAL_CAPSULE | Freq: Two times a day (BID) | ORAL | Status: DC
  Administered 2012-11-10 – 2012-11-12 (×5): 200 mg via ORAL
  Filled 2012-11-10 (×6): qty 2

## 2012-11-10 MED ORDER — HYDROMORPHONE HCL PF 1 MG/ML IJ SOLN
1.0000 mg | INTRAMUSCULAR | Status: DC | PRN
  Administered 2012-11-10 – 2012-11-11 (×3): 1 mg via INTRAVENOUS
  Filled 2012-11-10 (×3): qty 1

## 2012-11-10 MED ORDER — BISACODYL 10 MG RE SUPP
10.0000 mg | Freq: Once | RECTAL | Status: AC
  Administered 2012-11-10: 10 mg via RECTAL
  Filled 2012-11-10: qty 1

## 2012-11-10 MED ORDER — GLYBURIDE 5 MG PO TABS
10.0000 mg | ORAL_TABLET | Freq: Every day | ORAL | Status: DC
  Administered 2012-11-11 – 2012-11-17 (×6): 10 mg via ORAL
  Filled 2012-11-10 (×10): qty 2

## 2012-11-10 MED ORDER — GLYBURIDE 5 MG PO TABS
5.0000 mg | ORAL_TABLET | Freq: Every day | ORAL | Status: DC
  Administered 2012-11-10 – 2012-11-16 (×7): 5 mg via ORAL
  Filled 2012-11-10 (×8): qty 1

## 2012-11-10 MED ORDER — PIOGLITAZONE HCL 30 MG PO TABS
30.0000 mg | ORAL_TABLET | Freq: Every day | ORAL | Status: DC
  Administered 2012-11-11 – 2012-11-17 (×7): 30 mg via ORAL
  Filled 2012-11-10 (×7): qty 1

## 2012-11-10 MED ORDER — MAGNESIUM CITRATE PO SOLN
1.0000 | Freq: Once | ORAL | Status: AC
  Administered 2012-11-10: 1 via ORAL

## 2012-11-10 MED ORDER — METHYLPREDNISOLONE 4 MG PO KIT
8.0000 mg | PACK | Freq: Every evening | ORAL | Status: AC
  Administered 2012-11-11: 8 mg via ORAL

## 2012-11-10 MED ORDER — METHYLPREDNISOLONE 4 MG PO KIT
4.0000 mg | PACK | ORAL | Status: AC
  Administered 2012-11-10: 4 mg via ORAL

## 2012-11-10 MED ORDER — GABAPENTIN 100 MG PO CAPS
100.0000 mg | ORAL_CAPSULE | Freq: Three times a day (TID) | ORAL | Status: DC
  Administered 2012-11-10 – 2012-11-11 (×4): 100 mg via ORAL
  Filled 2012-11-10 (×6): qty 1

## 2012-11-10 MED ORDER — METHYLPREDNISOLONE 4 MG PO KIT
8.0000 mg | PACK | Freq: Every evening | ORAL | Status: AC
  Administered 2012-11-10: 8 mg via ORAL

## 2012-11-10 MED ORDER — METHYLPREDNISOLONE 4 MG PO KIT
4.0000 mg | PACK | Freq: Four times a day (QID) | ORAL | Status: AC
  Administered 2012-11-12 – 2012-11-15 (×10): 4 mg via ORAL

## 2012-11-10 MED ORDER — NAPROXEN 500 MG PO TABS
500.0000 mg | ORAL_TABLET | Freq: Two times a day (BID) | ORAL | Status: DC
  Administered 2012-11-11 – 2012-11-15 (×8): 500 mg via ORAL
  Filled 2012-11-10 (×11): qty 1

## 2012-11-10 MED ORDER — METHYLPREDNISOLONE 4 MG PO KIT
8.0000 mg | PACK | Freq: Every morning | ORAL | Status: AC
  Administered 2012-11-10: 8 mg via ORAL
  Filled 2012-11-10: qty 21

## 2012-11-10 NOTE — Progress Notes (Signed)
[facility] PROGRESS NOTE  [name] [id number] DOB: 12-14-1931 DOA: 11/09/2012 PCP: No primary provider on file.  Assessment/Plan  Acute back pain, by exam, seems consistent with sciatica.  + SLR, palpation of gluteal origin of sciatic n. Recreates pain.   -  Start gabapentin -  Start medrol dose pack -  Continue narcotics - may need social worker consult if placement needed -  PT consult pending 1. DM- hypoglycemic this AM, but at risk for hyperglycemia while on steroids.  Hold home medications today and start SSI 2. Obesity- certainly contributing to his back pain 3. H/o back surgery- (Dr. Georgana Curio)  4. Constipation- continue senna.  Add colace and give magnesium citrate 5. HTN, BP mildly elevated, likely due to pain.  Control pain and continue current BP medications  Diet:  diabetic Access:  PIV IVF:  KVO Proph:  lovenox  Code Status: full code Family Communication: spoke with patient and wife Disposition Plan: pending improvement in pain.  PT evaluation   Consultants:  none  Procedures:  XR  Antibiotics:  none   HPI/Subjective:  Continues to have severe pain the left gluteal region that radiates down left leg, worse with lifting leg up.  Unable to sleep due to pain.  Denies CP, SOB, N/V/D.  + constipation, no BM x 5 days.    Objective: Filed Vitals:   11/09/12 1634 11/09/12 2200 11/10/12 0648 11/10/12 1007  BP: 145/71 164/92 117/73 159/73  Pulse: 82 85 82 92  Temp: 97.5 F (36.4 C) 98.2 F (36.8 C) 98 F (36.7 C)   TempSrc: Oral Oral Oral   Resp: 20 20 20    Height: 5\' 11"  (1.803 m)     Weight: 135.943 kg (299 lb 11.2 oz)     SpO2: 97% 96% 92% 96%    Intake/Output Summary (Last 24 hours) at 11/10/12 1031 Last data filed at 11/10/12 0436  Gross per 24 hour  Intake      0 ml  Output    750 ml  Net   -750 ml   Filed Weights   11/09/12 1634  Weight: 135.943 kg (299 lb 11.2 oz)    Exam:   General:  Obese CM, No acute  distress  HEENT:  NCAT, MMM  Cardiovascular:  RRR, nl S1, S2 no mrg, 2+ pulses, warm extremities  Respiratory:  CTAB, no increased WOB  Abdomen:   NABS, soft, NT, obese  MSK:   Normal tone and bulk, no LEE  Neuro:  Pain with palpation of the left gluteal region and over left SI joint that causes radiation down the left leg.  Pain increased with raising the left leg, but not increased with raising of the right leg.  No pain with palpation of the lumbar spinous processes or paraspinous muscles.    Data Reviewed: Basic Metabolic Panel:  Recent Labs Lab 11/09/12 1312 11/10/12 0603  NA 139 139  K 3.9 4.0  CL 104 101  CO2  --  30  GLUCOSE 146* 62*  BUN 38* 26*  CREATININE 1.30 1.11  CALCIUM  --  9.2   Liver Function Tests: No results found for this basename: AST, ALT, ALKPHOS, BILITOT, PROT, ALBUMIN,  in the last 168 hours No results found for this basename: LIPASE, AMYLASE,  in the last 168 hours No results found for this basename: AMMONIA,  in the last 168 hours CBC:  Recent Labs Lab 11/09/12 1312 11/10/12 0603  WBC  --  10.6*  HGB 14.6 13.3  HCT 43.0 41.4  MCV  --  90.0  PLT  --  320   Cardiac Enzymes: No results found for this basename: CKTOTAL, CKMB, CKMBINDEX, TROPONINI,  in the last 168 hours BNP (last 3 results) No results found for this basename: PROBNP,  in the last 8760 hours CBG:  Recent Labs Lab 11/09/12 1714 11/09/12 2121 11/10/12 0732  GLUCAP 78 91 88    No results found for this or any previous visit (from the past 240 hour(s)).   Studies: Dg Lumbar Spine 2-3 Views  11/09/2012  *RADIOLOGY REPORT*  Clinical Data: There are low back pain and left leg pain.  LUMBAR SPINE - 2-3 VIEW  Comparison: Radiograph dated 05/11/2005  Findings: There is no fracture or bone destruction.  Slight narrowing of the L4-5 disc space.  Evidence of previous surgery at L4-5 and L5-S1.  No spondylolisthesis.  IMPRESSION: No acute abnormality of the lumbar spine.   Postsurgical changes at L4-5 and L5-S1.  Disc space narrowing at L4-5.   Original Report Authenticated By: Lorriane Shire, M.D.     Scheduled Meds: . aspirin  325 mg Oral Daily  . docusate sodium  200 mg Oral BID  . enoxaparin (LOVENOX) injection  70 mg Subcutaneous Q24H  . gabapentin  100 mg Oral TID  . galantamine  8 mg Oral BID WC  . hydrochlorothiazide  25 mg Oral Daily  . insulin aspart  0-20 Units Subcutaneous TID WC  . insulin aspart  0-5 Units Subcutaneous QHS  . lisinopril  20 mg Oral Daily  . magnesium citrate  1 Bottle Oral Once  . methylPREDNISolone  4 mg Oral PC lunch  . methylPREDNISolone  4 mg Oral PC supper  . [START ON 11/11/2012] methylPREDNISolone  4 mg Oral 3 x daily with food  . [START ON 11/12/2012] methylPREDNISolone  4 mg Oral 4X daily taper  . methylPREDNISolone  8 mg Oral AC breakfast  . methylPREDNISolone  8 mg Oral Nightly  . [START ON 11/11/2012] methylPREDNISolone  8 mg Oral Nightly  . polyvinyl alcohol  1 drop Both Eyes BID  . senna  1 tablet Oral BID  . simvastatin  40 mg Oral q1800  . traZODone  100 mg Oral QHS  . venlafaxine XR  150 mg Oral QPC supper   Continuous Infusions:   Principal Problem:   Acute back pain Active Problems:   Diabetes   History of back surgery   Obesity    Time spent: 30 min    Davyd Podgorski, [facility] Pager [phone]. If 7PM-7AM, please contact night-coverage at [email or web address], password [facility] 11/10/2012, 10:31 AM  LOS: 1 day

## 2012-11-11 ENCOUNTER — Inpatient Hospital Stay (HOSPITAL_COMMUNITY): Payer: Medicare Other

## 2012-11-11 LAB — GLUCOSE, CAPILLARY
Glucose-Capillary: 184 mg/dL — ABNORMAL HIGH (ref 70–99)
Glucose-Capillary: 150 mg/dL — ABNORMAL HIGH (ref 70–99)
Glucose-Capillary: 173 mg/dL — ABNORMAL HIGH (ref 70–99)

## 2012-11-11 LAB — BASIC METABOLIC PANEL
CO2: 31 mEq/L (ref 19–32)
Chloride: 97 mEq/L (ref 96–112)
Creatinine, Ser: 0.96 mg/dL (ref 0.50–1.35)
Potassium: 4.4 mEq/L (ref 3.5–5.1)
Sodium: 135 mEq/L (ref 135–145)

## 2012-11-11 LAB — CBC
HCT: 43.2 % (ref 39.0–52.0)
Hemoglobin: 13.9 g/dL (ref 13.0–17.0)
MCV: 89.1 fL (ref 78.0–100.0)
RBC: 4.85 MIL/uL (ref 4.22–5.81)
WBC: 12.6 10*3/uL — ABNORMAL HIGH (ref 4.0–10.5)

## 2012-11-11 MED ORDER — OXYCODONE HCL 5 MG PO TABS
15.0000 mg | ORAL_TABLET | ORAL | Status: DC | PRN
  Administered 2012-11-11 – 2012-11-14 (×7): 15 mg via ORAL
  Filled 2012-11-11 (×2): qty 3
  Filled 2012-11-11: qty 2
  Filled 2012-11-11: qty 3
  Filled 2012-11-11: qty 1
  Filled 2012-11-11: qty 2
  Filled 2012-11-11: qty 3
  Filled 2012-11-11: qty 1
  Filled 2012-11-11: qty 3

## 2012-11-11 MED ORDER — HYDROMORPHONE HCL PF 1 MG/ML IJ SOLN: 1.0000 mg | INTRAMUSCULAR | Status: DC | PRN

## 2012-11-11 MED ORDER — GADOBENATE DIMEGLUMINE 529 MG/ML IV SOLN
20.0000 mL | Freq: Once | INTRAVENOUS | Status: AC | PRN
  Administered 2012-11-11: 20 mL via INTRAVENOUS

## 2012-11-11 MED ORDER — HYDRALAZINE HCL 20 MG/ML IJ SOLN
10.0000 mg | INTRAMUSCULAR | Status: DC | PRN
  Administered 2012-11-11 – 2012-11-12 (×2): 10 mg via INTRAVENOUS
  Filled 2012-11-11 (×2): qty 1

## 2012-11-11 MED ORDER — OXYCODONE-ACETAMINOPHEN 5-325 MG PO TABS
1.0000 | ORAL_TABLET | ORAL | Status: DC | PRN
  Administered 2012-11-11 – 2012-11-16 (×10): 1 via ORAL
  Filled 2012-11-11 (×10): qty 1

## 2012-11-11 MED ORDER — GABAPENTIN 100 MG PO CAPS
200.0000 mg | ORAL_CAPSULE | Freq: Three times a day (TID) | ORAL | Status: DC
  Administered 2012-11-11 – 2012-11-12 (×3): 200 mg via ORAL
  Filled 2012-11-11 (×5): qty 2

## 2012-11-11 MED ORDER — INSULIN ASPART 100 UNIT/ML ~~LOC~~ SOLN
2.0000 [IU] | Freq: Three times a day (TID) | SUBCUTANEOUS | Status: DC
  Administered 2012-11-12 – 2012-11-13 (×3): 2 [IU] via SUBCUTANEOUS

## 2012-11-11 MED ORDER — POLYETHYLENE GLYCOL 3350 17 G PO PACK
34.0000 g | PACK | Freq: Two times a day (BID) | ORAL | Status: DC
  Administered 2012-11-11 – 2012-11-12 (×2): 34 g via ORAL
  Filled 2012-11-11 (×3): qty 2

## 2012-11-11 MED ORDER — MAGNESIUM CITRATE PO SOLN
1.0000 | Freq: Once | ORAL | Status: AC
  Administered 2012-11-11: 1 via ORAL

## 2012-11-11 MED ORDER — BISACODYL 5 MG PO TBEC
10.0000 mg | DELAYED_RELEASE_TABLET | Freq: Every day | ORAL | Status: DC
  Administered 2012-11-11 – 2012-11-13 (×3): 10 mg via ORAL
  Filled 2012-11-11: qty 1
  Filled 2012-11-11 (×2): qty 2

## 2012-11-11 MED ORDER — HYDROMORPHONE HCL PF 2 MG/ML IJ SOLN
2.0000 mg | INTRAMUSCULAR | Status: DC | PRN
  Administered 2012-11-12 – 2012-11-16 (×14): 2 mg via INTRAVENOUS
  Filled 2012-11-11 (×14): qty 1

## 2012-11-11 NOTE — Progress Notes (Signed)
PT Cancellation Note  Patient Details Name: [name] MRN: [id number] DOB: 1932/01/23   Cancelled Treatment:    Reason Eval/Treat Not Completed: Pain limiting ability to participate;Other (comment) (pt attempted sidelying to sit but couldn't tolerate movement). Pt rating pain as "13/10". Will re-attempt tomorrow.    Blondell Reveal Kistler 11/11/2012, 1:38 PM [phone]

## 2012-11-11 NOTE — Progress Notes (Signed)
[facility] PROGRESS NOTE  [name] [id number] DOB: 1932/08/24 DOA: 11/09/2012 PCP: No primary provider on file.  Assessment/Plan  Acute back pain, initially consistent with sciatica, however, today he is having severe left lumbar pain that with palpation causes pain to radiate down leg.    Pain had mostly resolved all day yesterday and recently restarted this MA -  Continue naprosyn -  Incr gabapentin -  Cont medrol dose pack -  Continue narcotics -  MRI lumbar spine >> based on results, NSGY or IR for injection -  PT consult pending  1. DM- at risk for hyperglycemia on steroids -  Restart oral DM meds -  Continue SSI -  May need additional meal time coverage 2. Obesity- certainly contributing to his back pain 3. H/o back surgery- (Dr. Georgana Curio)  Constipation- partially relieved yesterday -  Magnesium citrate again this morning -  Add bisacodyl and BID miralax -  Continue senna, colace  4. HTN, BP mildly elevated, likely due to pain.  Control pain and continue current BP medications - cont hydralazine prn  Diet:  diabetic Access:  PIV IVF:  KVO Proph:  lovenox  Code Status: full code Family Communication: spoke with patient and wife Disposition Plan: pending improvement in pain.  PT evaluation   Consultants:  none  Procedures:  XR  Antibiotics:  none   HPI/Subjective:  Pain resolved yesterday during most of day and he felt well.  Pain returned 10/.10 this AM, but now originates from left lumbar back and radiates down left leg.  Denies CP, SOB, N/V/D.  Small BM this AM.     Objective: Filed Vitals:   11/10/12 1007 11/10/12 1511 11/10/12 2200 11/11/12 0600  BP: 159/73 154/81 191/104 162/72  Pulse: 92 85 93 93  Temp:  98.2 F (36.8 C) 98.2 F (36.8 C) 98.2 F (36.8 C)  TempSrc:  Oral Oral Oral  Resp:  20 20 20   Height:      Weight:      SpO2: 96% 92% 92% 92%    Intake/Output Summary (Last 24 hours) at 11/11/12 1125 Last data  filed at 11/10/12 1800  Gross per 24 hour  Intake    240 ml  Output      0 ml  Net    240 ml   Filed Weights   11/09/12 1634  Weight: 135.943 kg (299 lb 11.2 oz)    Exam:   General:  Obese CM, No acute distress  HEENT:  NCAT, MMM  Cardiovascular:  RRR, nl S1, S2 no mrg, 2+ pulses, warm extremities  Respiratory:  CTAB, no increased WOB  Abdomen:   NABS, soft, NT, obese  MSK:   Normal tone and bulk, no LEE Neuro:  Pain with palpation of the left L3-L4 lumbar region with some palpable scar? That causes pain to radiate down the left leg.  Pain with raising of the left leg, but not the right.  Strength and sensation intact BLE.    Data Reviewed: Basic Metabolic Panel:  Recent Labs Lab 11/09/12 1312 11/10/12 0603 11/11/12 0453  NA 139 139 135  K 3.9 4.0 4.4  CL 104 101 97  CO2  --  30 31  GLUCOSE 146* 62* 206*  BUN 38* 26* 23  CREATININE 1.30 1.11 0.96  CALCIUM  --  9.2 9.3   Liver Function Tests: No results found for this basename: AST, ALT, ALKPHOS, BILITOT, PROT, ALBUMIN,  in the last 168 hours No results found for  this basename: LIPASE, AMYLASE,  in the last 168 hours No results found for this basename: AMMONIA,  in the last 168 hours CBC:  Recent Labs Lab 11/09/12 1312 11/10/12 0603 11/11/12 0453  WBC  --  10.6* 12.6*  HGB 14.6 13.3 13.9  HCT 43.0 41.4 43.2  MCV  --  90.0 89.1  PLT  --  320 356   Cardiac Enzymes: No results found for this basename: CKTOTAL, CKMB, CKMBINDEX, TROPONINI,  in the last 168 hours BNP (last 3 results) No results found for this basename: PROBNP,  in the last 8760 hours CBG:  Recent Labs Lab 11/10/12 1128 11/10/12 1658 11/10/12 2206 11/11/12 0746 11/11/12 1103  GLUCAP 90 137* 191* 184* 150*    No results found for this or any previous visit (from the past 240 hour(s)).   Studies: Dg Lumbar Spine 2-3 Views  11/09/2012  *RADIOLOGY REPORT*  Clinical Data: There are low back pain and left leg pain.  LUMBAR SPINE -  2-3 VIEW  Comparison: Radiograph dated 05/11/2005  Findings: There is no fracture or bone destruction.  Slight narrowing of the L4-5 disc space.  Evidence of previous surgery at L4-5 and L5-S1.  No spondylolisthesis.  IMPRESSION: No acute abnormality of the lumbar spine.  Postsurgical changes at L4-5 and L5-S1.  Disc space narrowing at L4-5.   Original Report Authenticated By: Lorriane Shire, M.D.     Scheduled Meds: . aspirin  325 mg Oral Daily  . bisacodyl  10 mg Oral Daily  . docusate sodium  200 mg Oral BID  . enoxaparin (LOVENOX) injection  70 mg Subcutaneous Q24H  . gabapentin  200 mg Oral TID  . galantamine  8 mg Oral BID WC  . glyBURIDE  10 mg Oral Q breakfast  . glyBURIDE  5 mg Oral QHS  . hydrochlorothiazide  25 mg Oral Daily  . insulin aspart  0-20 Units Subcutaneous TID WC  . insulin aspart  0-5 Units Subcutaneous QHS  . lisinopril  20 mg Oral Daily  . magnesium citrate  1 Bottle Oral Once  . methylPREDNISolone  4 mg Oral 3 x daily with food  . [START ON 11/12/2012] methylPREDNISolone  4 mg Oral 4X daily taper  . methylPREDNISolone  8 mg Oral Nightly  . naproxen  500 mg Oral BID WC  . pioglitazone  30 mg Oral Daily  . polyethylene glycol  34 g Oral BID  . polyvinyl alcohol  1 drop Both Eyes BID  . senna  1 tablet Oral BID  . simvastatin  40 mg Oral q1800  . traZODone  100 mg Oral QHS  . venlafaxine XR  150 mg Oral QPC supper   Continuous Infusions:   Principal Problem:   Acute back pain Active Problems:   Diabetes   History of back surgery   Obesity    Time spent: 30 min    [name], [name] Pager [phone]. If 7PM-7AM, please contact night-coverage at [email or web address], password [facility] 11/11/2012, 11:25 AM  LOS: 2 days

## 2012-11-12 DIAGNOSIS — D72829 Elevated white blood cell count, unspecified: Secondary | ICD-10-CM

## 2012-11-12 DIAGNOSIS — I10 Essential (primary) hypertension: Secondary | ICD-10-CM

## 2012-11-12 DIAGNOSIS — R739 Hyperglycemia, unspecified: Secondary | ICD-10-CM

## 2012-11-12 DIAGNOSIS — K5900 Constipation, unspecified: Secondary | ICD-10-CM

## 2012-11-12 DIAGNOSIS — M5126 Other intervertebral disc displacement, lumbar region: Principal | ICD-10-CM

## 2012-11-12 DIAGNOSIS — IMO0002 Reserved for concepts with insufficient information to code with codable children: Secondary | ICD-10-CM

## 2012-11-12 DIAGNOSIS — S3421XA Injury of nerve root of lumbar spine, initial encounter: Secondary | ICD-10-CM

## 2012-11-12 DIAGNOSIS — M5417 Radiculopathy, lumbosacral region: Secondary | ICD-10-CM

## 2012-11-12 LAB — CBC
Hemoglobin: 14.2 g/dL (ref 13.0–17.0)
MCHC: 32.1 g/dL (ref 30.0–36.0)
Platelets: 338 10*3/uL (ref 150–400)
RBC: 4.95 MIL/uL (ref 4.22–5.81)

## 2012-11-12 LAB — GLUCOSE, CAPILLARY
Glucose-Capillary: 197 mg/dL — ABNORMAL HIGH (ref 70–99)
Glucose-Capillary: 142 mg/dL — ABNORMAL HIGH (ref 70–99)

## 2012-11-12 LAB — BASIC METABOLIC PANEL
Calcium: 9.2 mg/dL (ref 8.4–10.5)
GFR calc Af Amer: 89 mL/min — ABNORMAL LOW (ref >90)
GFR calc non Af Amer: 77 mL/min — ABNORMAL LOW (ref >90)
Potassium: 4.4 mEq/L (ref 3.5–5.1)
Sodium: 137 mEq/L (ref 135–145)

## 2012-11-12 LAB — PROTIME-INR
INR: 1.07 (ref 0.00–1.49)
Prothrombin Time: 13.8 seconds (ref 11.6–15.2)

## 2012-11-12 MED ORDER — PEG 3350-KCL-NA BICARB-NACL 420 G PO SOLR
4000.0000 mL | Freq: Once | ORAL | Status: AC
  Administered 2012-11-12: 4000 mL via ORAL

## 2012-11-12 MED ORDER — GABAPENTIN 300 MG PO CAPS
300.0000 mg | ORAL_CAPSULE | Freq: Three times a day (TID) | ORAL | Status: DC
  Administered 2012-11-12 – 2012-11-17 (×15): 300 mg via ORAL
  Filled 2012-11-12 (×17): qty 1

## 2012-11-12 MED ORDER — HYDRALAZINE HCL 10 MG PO TABS
10.0000 mg | ORAL_TABLET | Freq: Three times a day (TID) | ORAL | Status: DC
  Filled 2012-11-12 (×3): qty 1

## 2012-11-12 MED ORDER — HYDRALAZINE HCL 25 MG PO TABS
25.0000 mg | ORAL_TABLET | Freq: Three times a day (TID) | ORAL | Status: DC
  Administered 2012-11-12 – 2012-11-16 (×9): 25 mg via ORAL
  Filled 2012-11-12 (×15): qty 1

## 2012-11-12 NOTE — Progress Notes (Signed)
Pt had lax. With excellent results.  Large liquid stool noted, several times.

## 2012-11-12 NOTE — Progress Notes (Signed)
OT Cancellation Note  Patient Details Name: JIANNI WIST MRN: [id number] DOB: [birth year]   Cancelled Treatment:    Reason Eval/Treat Not Completed: Other (comment) (surgical consult)  Issa Luster, Thereasa Parkin 11/12/2012, 1:10 PM

## 2012-11-12 NOTE — Plan of Care (Signed)
Problem: Phase I Progression Outcomes Goal: Other Phase I Outcomes/Goals Outcome: Progressing Ortho md saw pt plans surgery later this week.

## 2012-11-12 NOTE — Progress Notes (Signed)
PT Cancellation Note  Patient Details Name: [name] MRN: [id number] DOB: [birth year]   Cancelled Treatment:    Reason Eval/Treat Not Completed: Pain limiting ability to participate;Other (comment)for surgical ortho consult.   Claretha Cooper 11/12/2012, 10:53 AM [phone]

## 2012-11-12 NOTE — Progress Notes (Addendum)
[facility] PROGRESS NOTE  [name] [id number] DOB: 05-23-1932 DOA: 11/09/2012 PCP: No primary provider on file.  Assessment/Plan  Acute back pain, initially consistent with sciatica, however, he developed severe left lumbar pain that with palpation causing pain to radiate down leg.  MRI demonstrates protrusion of disc causing compression of the left L5 n. Root consistent with patient's pain.  Spoke with radiologist this morning who states that patient would likely not benefit from n. Root block and recommends surgical consultation.  Pain still uncontrolled despite NSAIDS, gabapentin, steroids, and oral and IV narcotics and patient is barely able to move in bed without severe pain.   -  [place] consult placed, will see this afternoon -  Continue naprosyn -  Incr gabapentin -  Cont medrol dose pack -  Continue narcotics -  PT consult pending 2/2 pain control  DM- at risk for hyperglycemia on steroids, FS rising -  Continue oral antihyperglycemics -  Continue SSI -  Meal time coverage added  Obesity- contributing to his back pain.   Constipation- partially relieved yesterday -  golytely cleanout today, then will start aggressive miralax for maintenance -  Continue senna, colace   HTN, BP very elevated, likely due to pain and steroids.   -  Add hydralazine 25mg  TID - cont hydralazine prn  Leukocytosis, likely related to medrol dose pack.  Denies  Urinary and respiratory symptoms.  Diet:  diabetic Access:  PIV IVF:  KVO Proph:  lovenox  Code Status: full code Family Communication: spoke with patient and wife Disposition Plan: pending improvement in pain.     Consultants:  none  Procedures:  XR  Antibiotics:  none   HPI/Subjective:  Pain 10/10 yesterday, got somewhat better overnight, but still unable to bear weight and now pain is 10/10 again.  Had pellet like stool early yesterday and some liquidy stool afterwards, but still feels constipated.  Denies  CP, SOB, N/V/D.   Objective: Filed Vitals:   11/11/12 0600 11/11/12 2200 11/12/12 0600 11/12/12 0700  BP: 162/72 176/86 188/74 170/97  Pulse: 93 82 91 93  Temp: 98.2 F (36.8 C) 98.7 F (37.1 C) 98.6 F (37 C)   TempSrc: Oral Oral Oral   Resp: 20 20 20    Height:      Weight:      SpO2: 92% 94% 93% 95%    Intake/Output Summary (Last 24 hours) at 11/12/12 0945 Last data filed at 11/12/12 0516  Gross per 24 hour  Intake    240 ml  Output   1025 ml  Net   -785 ml   Filed Weights   11/09/12 1634  Weight: 135.943 kg (299 lb 11.2 oz)    Exam:   General:  Obese CM, No acute distress, writhing in bed from pain  HEENT:  NCAT, MMM  Cardiovascular:  RRR, nl S1, S2 no mrg, 2+ pulses, warm extremities   Respiratory:  CTAB, no increased WOB  Abdomen:   NABS, soft, NT, obese  MSK:   Normal tone and bulk, no LEE  Neuro:  Pain with palpation of the left L3-L4 lumbar region with some palpable scar that causes pain to radiate down the left leg.  Pain with raising of the left leg, but not the right.  Strength slightly decreased with 1st toe dorsiflexion of the left foot 5-/5 compared to 5/5 right.  Sensation mildly reduced he states in the big toe also left foot, otherwise intact    Data Reviewed: Basic Metabolic Panel:  Recent Labs Lab 11/09/12 1312 11/10/12 0603 11/11/12 0453 11/12/12 0440  NA 139 139 135 137  K 3.9 4.0 4.4 4.4  CL 104 101 97 98  CO2  --  30 31 30   GLUCOSE 146* 62* 206* 186*  BUN 38* 26* 23 27*  CREATININE 1.30 1.11 0.96 0.98  CALCIUM  --  9.2 9.3 9.2   Liver Function Tests: No results found for this basename: AST, ALT, ALKPHOS, BILITOT, PROT, ALBUMIN,  in the last 168 hours No results found for this basename: LIPASE, AMYLASE,  in the last 168 hours No results found for this basename: AMMONIA,  in the last 168 hours CBC:  Recent Labs Lab 11/09/12 1312 11/10/12 0603 11/11/12 0453 11/12/12 0440  WBC  --  10.6* 12.6* 13.7*  HGB 14.6 13.3 13.9  14.2  HCT 43.0 41.4 43.2 44.3  MCV  --  90.0 89.1 89.5  PLT  --  320 356 338   Cardiac Enzymes: No results found for this basename: CKTOTAL, CKMB, CKMBINDEX, TROPONINI,  in the last 168 hours BNP (last 3 results) No results found for this basename: PROBNP,  in the last 8760 hours CBG:  Recent Labs Lab 11/10/12 2206 11/11/12 0746 11/11/12 1103 11/11/12 1634 11/11/12 2057  GLUCAP 191* 184* 150* 173* 197*    No results found for this or any previous visit (from the past 240 hour(s)).   Studies: Mr Lumbar Spine W Wo Contrast  11/11/2012  *RADIOLOGY REPORT*  Clinical Data: Left hip and leg pain.  Difficulty walking.  Left- sided low back pain.  Prior lumbar surgery.  MRI LUMBAR SPINE WITHOUT AND WITH CONTRAST  Technique:  Multiplanar and multiecho pulse sequences of the lumbar spine were obtained without and with intravenous contrast.  Contrast: 7mL MULTIHANCE GADOBENATE DIMEGLUMINE 529 MG/ML IV SOLN  Comparison: Radiographs dated 11/09/2012 and CT myelogram dated 05/03/2005  Findings: Normal conus tip is at T12-L1.  Normal paraspinal soft tissues.  T11-12 and T12-L1:  Normal discs.  Hemangioma in L1.  Slight arthritis of the facet joints.  L1-2:  Normal disc.  Small Schmorl's node in the L2 vertebral body, unchanged since 05/03/2005.  L2-3:  Hypertrophy of the ligamenta flava facet joints congenitally [name] pedicles create a fairly severe spinal stenosis.  Tiny disc bulges into the neural foramina without neural impingement.  This is more severe than on the prior CT myelogram.  L3-4:  Hypertrophy of the facet joints and ligamenta flava.  Tiny bulges of the disc into the neural foramina with no neural impingement.  Slight narrowing of the spinal canal due to [name] pedicles.  L4-5: There is a focal disc extrusion into the left lateral recess behind the body of L5 which should compress the left L5 nerve. There is some enhancement around this disc fragment.  The patient has had prior surgery at  L4-5.  L5 S1:  Annular tear at the level of the left neural foramen.  No bulging of the disc.  IMPRESSION: 1.  Focal small disc extrusion at L4-5 extending inferiorly in the left lateral recess compressing the left L5 nerve. 2.  Fairly severe spinal stenosis at L2-3 due to congenital Doni Bacha pedicles and hypertrophy of the ligamenta flava and facet joints, increased since the prior CT myelogram of 05/03/2005.   Original Report Authenticated By: Lorriane Shire, M.D.     Scheduled Meds: . aspirin  325 mg Oral Daily  . bisacodyl  10 mg Oral Daily  . docusate sodium  200 mg Oral BID  .  enoxaparin (LOVENOX) injection  70 mg Subcutaneous Q24H  . gabapentin  200 mg Oral TID  . galantamine  8 mg Oral BID WC  . glyBURIDE  10 mg Oral Q breakfast  . glyBURIDE  5 mg Oral QHS  . hydrochlorothiazide  25 mg Oral Daily  . insulin aspart  0-20 Units Subcutaneous TID WC  . insulin aspart  0-5 Units Subcutaneous QHS  . insulin aspart  2 Units Subcutaneous TID WC  . lisinopril  20 mg Oral Daily  . methylPREDNISolone  4 mg Oral 4X daily taper  . naproxen  500 mg Oral BID WC  . pioglitazone  30 mg Oral Daily  . polyethylene glycol  34 g Oral BID  . polyvinyl alcohol  1 drop Both Eyes BID  . senna  1 tablet Oral BID  . simvastatin  40 mg Oral q1800  . traZODone  100 mg Oral QHS  . venlafaxine XR  150 mg Oral QPC supper   Continuous Infusions:   Principal Problem:   Acute back pain Active Problems:   Diabetes   History of back surgery   Obesity    Time spent: 30 min    Darwin Rothlisberger, [facility] Pager [phone]. If 7PM-7AM, please contact night-coverage at [email or web address], password [facility] 11/12/2012, 9:45 AM  LOS: 3 days
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
## 2012-11-13 DIAGNOSIS — R7309 Other abnormal glucose: Secondary | ICD-10-CM

## 2012-11-13 LAB — GLUCOSE, CAPILLARY
Glucose-Capillary: 104 mg/dL — ABNORMAL HIGH (ref 70–99)
Glucose-Capillary: 113 mg/dL — ABNORMAL HIGH (ref 70–99)
Glucose-Capillary: 88 mg/dL (ref 70–99)

## 2012-11-13 LAB — CBC
MCH: 28.6 pg (ref 26.0–34.0)
MCHC: 31.9 g/dL (ref 30.0–36.0)
Platelets: 346 10*3/uL (ref 150–400)
RBC: 4.93 MIL/uL (ref 4.22–5.81)
RDW: 14.5 % (ref 11.5–15.5)

## 2012-11-13 LAB — BASIC METABOLIC PANEL
Calcium: 9.3 mg/dL (ref 8.4–10.5)
GFR calc non Af Amer: 60 mL/min — ABNORMAL LOW (ref >90)
Glucose, Bld: 145 mg/dL — ABNORMAL HIGH (ref 70–99)

## 2012-11-13 LAB — SURGICAL PCR SCREEN: MRSA, PCR: NEGATIVE

## 2012-11-13 MED ORDER — INSULIN ASPART 100 UNIT/ML ~~LOC~~ SOLN
0.0000 [IU] | Freq: Three times a day (TID) | SUBCUTANEOUS | Status: DC
  Administered 2012-11-14 – 2012-11-15 (×2): 2 [IU] via SUBCUTANEOUS
  Administered 2012-11-16: 3 [IU] via SUBCUTANEOUS
  Administered 2012-11-16: 5 [IU] via SUBCUTANEOUS
  Administered 2012-11-16: 3 [IU] via SUBCUTANEOUS
  Administered 2012-11-17: 13:00:00 via SUBCUTANEOUS
  Administered 2012-11-17: 5 [IU] via SUBCUTANEOUS
  Administered 2012-11-17: 3 [IU] via SUBCUTANEOUS
  Administered 2012-11-20: 18:00:00 via SUBCUTANEOUS
  Administered 2012-11-21 (×2): 2 [IU] via SUBCUTANEOUS

## 2012-11-13 MED ORDER — BISACODYL 5 MG PO TBEC: 10.0000 mg | DELAYED_RELEASE_TABLET | Freq: Every day | ORAL | Status: DC | PRN

## 2012-11-13 MED ORDER — DOCUSATE SODIUM 100 MG PO CAPS
200.0000 mg | ORAL_CAPSULE | Freq: Two times a day (BID) | ORAL | Status: DC
  Administered 2012-11-14 – 2012-11-17 (×7): 200 mg via ORAL
  Filled 2012-11-13 (×8): qty 2

## 2012-11-13 MED ORDER — POLYETHYLENE GLYCOL 3350 17 G PO PACK
17.0000 g | PACK | Freq: Every day | ORAL | Status: DC
  Administered 2012-11-14 – 2012-11-17 (×4): 17 g via ORAL
  Filled 2012-11-13 (×4): qty 1

## 2012-11-13 MED ORDER — LABETALOL HCL 100 MG PO TABS
100.0000 mg | ORAL_TABLET | Freq: Three times a day (TID) | ORAL | Status: DC
  Administered 2012-11-14 – 2012-11-15 (×3): 100 mg via ORAL
  Filled 2012-11-13 (×10): qty 1

## 2012-11-13 MED ORDER — POLYETHYLENE GLYCOL 3350 17 G PO PACK: 17.0000 g | PACK | Freq: Every day | ORAL | Status: DC

## 2012-11-13 NOTE — Progress Notes (Signed)
PT Cancellation Note  Patient Details Name: [name] MRN: [id number] DOB: 01-18-1932   Cancelled Treatment:    Reason Eval/Treat Not Completed: Medical issues which prohibited therapy;Other (comment)Ortho consulted , recommends decompression. PT will sign off.   Claretha Cooper 11/13/2012, 7:06 AM [phone]

## 2012-11-13 NOTE — Progress Notes (Signed)
[facility] PROGRESS NOTE  [name] [id number] DOB: 1932-02-12 DOA: 11/09/2012 PCP: No primary provider on file.  Assessment/Plan  Acute back pain, initially consistent with sciatica, however, he developed severe left lumbar pain that with palpation causing pain to radiate down leg.  MRI demonstrates protrusion of disc causing compression of the left L5 n. Root consistent with patient's pain.  Pain still uncontrolled despite NSAIDS, gabapentin, steroids, and oral and IV narcotics and patient is barely able to move in bed without severe pain.   -  [place] planning to OR on 3/19 for microdiscectomy -  Continue naprosyn -  Continue gabapentin -  Cont medrol dose pack -  Continue narcotics -  PT consult pending 2/2 pain control  DM- at risk for hyperglycemia on steroids, FS trending down and currently 88 (did not eat well today) -  Continue oral antihyperglycemics -  Decrease SSI to mod dose -   D/c Meal time coverage   Obesity- contributing to his back pain.    Constipation- relieved after golytely -  Resume stool softeners on 3/19  HTN, BP very elevated, likely due to pain and steroids.  Minimal change despite addition of hydralazine.   -  Continue hydralazine 25mg  TID, lisinopril 20mg , HCTZ 25mg  -  Add labetalol 100mg  tid (first dose evening of 3/18) to control BP perioperatively - cont hydralazine prn -  Anticipate that many of these meds can be discontinued after steroids tapered off  Leukocytosis, likely related to medrol dose pack.  Denies  Urinary and respiratory symptoms.  Diet:  diabetic Access:  PIV IVF:  KVO Proph:  lovenox  Code Status: full code Family Communication: spoke with patient and wife Disposition Plan: pending improvement in pain.  To OR 3/19.  Pt and wife state that if he needs dispo to nursing home, they would like him to go to one near [place] [employer]?)   Consultants:  none  Procedures:  XR  Antibiotics:  none   HPI/Subjective:  Pain sligbut still unable to bear weight and now pain is 10/10 again.  Feels cleaned out after golytely yesterday.  Denies CP, SOB, N/V/C.  Objective: Filed Vitals:   11/12/12 2200 11/13/12 0600 11/13/12 1300 11/13/12 1334  BP: 162/82 116/60 193/96 148/64  Pulse: 97 82 91   Temp: 97.9 F (36.6 C) 98.5 F (36.9 C) 98 F (36.7 C)   TempSrc: Oral Oral Oral   Resp: 20 20 20    Height:      Weight:      SpO2: 93% 97% 91%     Intake/Output Summary (Last 24 hours) at 11/13/12 1852 Last data filed at 11/13/12 0630  Gross per 24 hour  Intake      0 ml  Output    900 ml  Net   -900 ml   Filed Weights   11/09/12 1634  Weight: 135.943 kg (299 lb 11.2 oz)    Exam:   General:  Obese CM, No acute distress, restless in bed  HEENT:  NCAT, MMM  Cardiovascular:  RRR, nl S1, S2 no mrg, 2+ pulses, warm extremities   Respiratory:  CTAB, no increased WOB  Abdomen:   NABS, soft, NT, obese  MSK:   Normal tone and bulk, no LEE  Neuro:  Less pain with palpation of the left L3-L4 lumbar region with some palpable scar that causes pain to radiate down the left leg.  Pain with raising of the left leg to about 30 degrees,  but not the right.  Strength slightly decreased with 1st toe dorsiflexion of the left foot 5-/5 compared to 5/5 right.  Sensation mildly reduced he states in the big toe also left foot, otherwise intact.    Data Reviewed: Basic Metabolic Panel:  Recent Labs Lab 11/09/12 1312 11/10/12 0603 11/11/12 0453 11/12/12 0440 11/13/12 0440  NA 139 139 135 137 136  K 3.9 4.0 4.4 4.4 4.3  CL 104 101 97 98 98  CO2  --  30 31 30 30   GLUCOSE 146* 62* 206* 186* 145*  BUN 38* 26* 23 27* 33*  CREATININE 1.30 1.11 0.96 0.98 1.13  CALCIUM  --  9.2 9.3 9.2 9.3   Liver Function Tests: No results found for this basename: AST, ALT, ALKPHOS, BILITOT, PROT, ALBUMIN,  in the last 168  hours No results found for this basename: LIPASE, AMYLASE,  in the last 168 hours No results found for this basename: AMMONIA,  in the last 168 hours CBC:  Recent Labs Lab 11/09/12 1312 11/10/12 0603 11/11/12 0453 11/12/12 0440 11/13/12 0440  WBC  --  10.6* 12.6* 13.7* 13.7*  HGB 14.6 13.3 13.9 14.2 14.1  HCT 43.0 41.4 43.2 44.3 44.2  MCV  --  90.0 89.1 89.5 89.7  PLT  --  320 356 338 346   Cardiac Enzymes: No results found for this basename: CKTOTAL, CKMB, CKMBINDEX, TROPONINI,  in the last 168 hours BNP (last 3 results) No results found for this basename: PROBNP,  in the last 8760 hours CBG:  Recent Labs Lab 11/12/12 1724 11/12/12 2131 11/13/12 0739 11/13/12 1155 11/13/12 1701  GLUCAP 136* 125* 104* 113* 88    Recent Results (from the past 240 hour(s))  SURGICAL PCR SCREEN     Status: Abnormal   Collection Time    11/13/12  3:17 PM      Result Value Range Status   MRSA, PCR NEGATIVE  NEGATIVE Final   Staphylococcus aureus POSITIVE (*) NEGATIVE Final   Comment:            The Xpert SA Assay (FDA     approved for NASAL specimens     in patients over 32 years of age),     is one component of     a comprehensive surveillance     program.  Test performance has     been validated by [employer] for patients greater     than or equal to 94 year old.     It is not intended     to diagnose infection nor to     guide or monitor treatment.     Studies: No results found.  Scheduled Meds: . aspirin  325 mg Oral Daily  . bisacodyl  10 mg Oral Daily  . enoxaparin (LOVENOX) injection  70 mg Subcutaneous Q24H  . gabapentin  300 mg Oral TID  . galantamine  8 mg Oral BID WC  . glyBURIDE  10 mg Oral Q breakfast  . glyBURIDE  5 mg Oral QHS  . hydrALAZINE  25 mg Oral Q8H  . hydrochlorothiazide  25 mg Oral Daily  . insulin aspart  0-20 Units Subcutaneous TID WC  . insulin aspart  0-5 Units Subcutaneous QHS  . insulin aspart  2 Units Subcutaneous TID WC  .  lisinopril  20 mg Oral Daily  . methylPREDNISolone  4 mg Oral 4X daily taper  . naproxen  500 mg Oral BID WC  . pioglitazone  30 mg Oral Daily  . polyvinyl alcohol  1 drop Both Eyes BID  . senna  1 tablet Oral BID  . simvastatin  40 mg Oral q1800  . traZODone  100 mg Oral QHS  . venlafaxine XR  150 mg Oral QPC supper   Continuous Infusions:   Principal Problem:   Acute back pain Active Problems:   Diabetes   History of back surgery   Obesity   Leukocytosis, unspecified   Lumbosacral radiculopathy at L5   Herniated lumbar intervertebral disc   Hyperglycemia   Essential hypertension, benign   Unspecified constipation    Time spent: 30 min    Indalecio Malmstrom, [facility] Pager [phone]. If 7PM-7AM, please contact night-coverage at [email or web address], password [facility] 11/13/2012, 6:52 PM  LOS: 4 days

## 2012-11-13 NOTE — Progress Notes (Signed)
Patient ID: [name], male   DOB: [birth year], 79 y.o.   MRN: [id number] Mr Wigton is an old patient of mine.  Dr Gladstone Lighter saw him in consultation and called me yesterday about surgery on him.  I talked with him and his wife'  He is miserable even at rest.  He has some numbness in lt great toe and slight weakness of foot dorsiflexion of his foot and great toe.  MRI of 3/16 is reviewed--he has a large HNP at L4-5 lt.  Will plan microdiscectomy tomorrow or 3/22 based on scheduling.  All this was discussed with him and his wife.l

## 2012-11-13 NOTE — Progress Notes (Signed)
OT Cancellation Note  Patient Details Name: [name] MRN: [id number] DOB: February 14, 1932   Cancelled Treatment:     Ortho is recommending decompression.  Will sign off.    Eamonn Sermeno 11/13/2012, 7:44 AM Lesle Chris, OTR/L [phone] 11/13/2012

## 2012-11-14 LAB — GLUCOSE, CAPILLARY
Glucose-Capillary: 139 mg/dL — ABNORMAL HIGH (ref 70–99)
Glucose-Capillary: 116 mg/dL — ABNORMAL HIGH (ref 70–99)

## 2012-11-14 LAB — CBC
MCH: 29 pg (ref 26.0–34.0)
MCHC: 32.2 g/dL (ref 30.0–36.0)
RDW: 14.4 % (ref 11.5–15.5)

## 2012-11-14 LAB — BASIC METABOLIC PANEL
BUN: 37 mg/dL — ABNORMAL HIGH (ref 6–23)
Creatinine, Ser: 1.29 mg/dL (ref 0.50–1.35)
GFR calc Af Amer: 60 mL/min — ABNORMAL LOW (ref >90)
GFR calc non Af Amer: 51 mL/min — ABNORMAL LOW (ref >90)

## 2012-11-14 NOTE — Progress Notes (Signed)
[facility] PROGRESS NOTE  SHYKEIM SOLE [id number] DOB: May 31, 1932 DOA: 11/09/2012 PCP: No primary provider on file.  Assessment/Plan: Acute back pain, initially consistent with sciatica, however, he developed severe left lumbar pain that with palpation causing pain to radiate down leg. MRI demonstrates protrusion of disc causing compression of the left L5 n. Root consistent with patient's pain. Pain still uncontrolled despite NSAIDS, gabapentin, steroids, and oral and IV narcotics and patient is barely able to move in bed without severe pain.  - [place] planning to OR on 3/20 for microdiscectomy - Continue naprosyn - Continue gabapentin - Cont medrol dose pack - Continue narcotics - PT consult pending 2/2 pain control   DM- - Continue oral antihyperglycemics - SSI decreased to mod dose - D/c Meal time coverage and currently blood sugars relatively well controlled.  Obesity- contributing to his back pain.   Constipation- relieved after golytely - Continue stool softeners   HTN, BP very elevated, likely due to pain and steroids. Minimal change despite addition of hydralazine.  - Continue hydralazine 25mg  TID, lisinopril 20mg , HCTZ 25mg  -  Continue labetalol - cont hydralazine prn - Anticipate that many of these meds can be discontinued after steroids tapered off   Leukocytosis, most likely related to medrol dose pack. No source of infection identified and patient remains afebrile. - Trending down off of antibiotics.   Code Status:full Family Communication: no family at bedside.  Disposition Plan: Pending specialist recommendations.   Consultants:  Ortho  Procedures:  Refer to above  Antibiotics:  none  HPI/Subjective: No new complaints.  Patient currently states that his pain is better controlled and he is better able to move his affected leg while he is on pain medication.  Objective: Filed Vitals:   11/14/12 0927 11/14/12 1020 11/14/12 1339 11/14/12  1541  BP: 100/59  142/71 160/83  Pulse: 97  83 91  Temp:   97.7 F (36.5 C)   TempSrc:   Oral   Resp:   18   Height:      Weight:  123.514 kg (272 lb 4.8 oz)    SpO2:   93%     Intake/Output Summary (Last 24 hours) at 11/14/12 1917 Last data filed at 11/14/12 1230  Gross per 24 hour  Intake    480 ml  Output    350 ml  Net    130 ml   Filed Weights   11/09/12 1634 11/14/12 1020  Weight: 135.943 kg (299 lb 11.2 oz) 123.514 kg (272 lb 4.8 oz)    Exam:   General:  Pt in NAD, Alert and Awake  Cardiovascular: RRR, No MRG  Respiratory: CTA BL, no wheezes  Abdomen: soft, obese, NT,   Musculoskeletal: no cyanosis or clubbing.   Data Reviewed: Basic Metabolic Panel:  Recent Labs Lab 11/10/12 0603 11/11/12 0453 11/12/12 0440 11/13/12 0440 11/14/12 0440  NA 139 135 137 136 135  K 4.0 4.4 4.4 4.3 4.4  CL 101 97 98 98 96  CO2 30 31 30 30 30   GLUCOSE 62* 206* 186* 145* 130*  BUN 26* 23 27* 33* 37*  CREATININE 1.11 0.96 0.98 1.13 1.29  CALCIUM 9.2 9.3 9.2 9.3 9.3   Liver Function Tests: No results found for this basename: AST, ALT, ALKPHOS, BILITOT, PROT, ALBUMIN,  in the last 168 hours No results found for this basename: LIPASE, AMYLASE,  in the last 168 hours No results found for this basename: AMMONIA,  in the last 168 hours CBC:  Recent  Labs Lab 11/10/12 0603 11/11/12 0453 11/12/12 0440 11/13/12 0440 11/14/12 0440  WBC 10.6* 12.6* 13.7* 13.7* 11.5*  HGB 13.3 13.9 14.2 14.1 14.0  HCT 41.4 43.2 44.3 44.2 43.5  MCV 90.0 89.1 89.5 89.7 90.2  PLT 320 356 338 346 321   Cardiac Enzymes: No results found for this basename: CKTOTAL, CKMB, CKMBINDEX, TROPONINI,  in the last 168 hours BNP (last 3 results) No results found for this basename: PROBNP,  in the last 8760 hours CBG:  Recent Labs Lab 11/13/12 1701 11/13/12 2137 11/14/12 0728 11/14/12 1131 11/14/12 1636  GLUCAP 88 218* 112* 139* 116*    Recent Results (from the past 240 hour(s))  SURGICAL  PCR SCREEN     Status: Abnormal   Collection Time    11/13/12  3:17 PM      Result Value Range Status   MRSA, PCR NEGATIVE  NEGATIVE Final   Staphylococcus aureus POSITIVE (*) NEGATIVE Final   Comment:            The Xpert SA Assay (FDA     approved for NASAL specimens     in patients over 29 years of age),     is one component of     a comprehensive surveillance     program.  Test performance has     been validated by [employer] for patients greater     than or equal to 79 year old.     It is not intended     to diagnose infection nor to     guide or monitor treatment.     Studies: No results found.  Scheduled Meds: . aspirin  325 mg Oral Daily  . docusate sodium  200 mg Oral BID  . enoxaparin (LOVENOX) injection  70 mg Subcutaneous Q24H  . gabapentin  300 mg Oral TID  . galantamine  8 mg Oral BID WC  . glyBURIDE  10 mg Oral Q breakfast  . glyBURIDE  5 mg Oral QHS  . hydrALAZINE  25 mg Oral Q8H  . hydrochlorothiazide  25 mg Oral Daily  . insulin aspart  0-15 Units Subcutaneous TID WC  . insulin aspart  0-5 Units Subcutaneous QHS  . labetalol  100 mg Oral TID  . lisinopril  20 mg Oral Daily  . methylPREDNISolone  4 mg Oral 4X daily taper  . naproxen  500 mg Oral BID WC  . pioglitazone  30 mg Oral Daily  . polyethylene glycol  17 g Oral Daily  . polyvinyl alcohol  1 drop Both Eyes BID  . simvastatin  40 mg Oral q1800  . traZODone  100 mg Oral QHS  . venlafaxine XR  150 mg Oral QPC supper   Continuous Infusions:   Principal Problem:   Acute back pain Active Problems:   Diabetes   History of back surgery   Obesity   Leukocytosis, unspecified   Lumbosacral radiculopathy at L5   Herniated lumbar intervertebral disc   Hyperglycemia   Essential hypertension, benign   Unspecified constipation    Time spent: > 35 minutes    Velvet Bathe  [facility] Pager [phone]. If 7PM-7AM, please contact night-coverage at [email or web address], password  [facility] 11/14/2012, 7:17 PM  LOS: 5 days

## 2012-11-14 NOTE — Care Management Note (Signed)
  Page 1 of 1   11/14/2012     6:49:13 PM   CARE MANAGEMENT NOTE 11/14/2012  Patient:  [name], [name]   Account Number:  [id number]  Date Initiated:  11/13/2012  Documentation initiated by:  Sherrin Daisy  Subjective/Objective Assessment:   dx herniated disc     Action/Plan:   Cm spoke with patient and spouse. Pt is from home. Plans uncertain untilr after surgery done. Possibility of snf rehab.CM will f/u after pt/ot eval post surgery   Anticipated DC Date:  11/17/2012   Anticipated DC Plan:  [place]  In-house referral  Clinical Social Worker      DC Planning Services  CM consult      Choice offered to / List presented to:             Status of service:  In process, will continue to follow Medicare Important Message given?   (If response is "NO", the following Medicare IM given date fields will be blank) Date Medicare IM given:   Date Additional Medicare IM given:    Discharge Disposition:    Per UR Regulation:  Reviewed for med. necessity/level of care/duration of stay  If discussed at Gap of Stay Meetings, dates discussed:    Comments:

## 2012-11-14 NOTE — Progress Notes (Signed)
Patient ID: [name], male   DOB: 05/03/1932, 79 y.o.   MRN: [id number] Microdiscectomy at L4-5 lt planned for 3/20 pm--discussed with him and his wife.

## 2012-11-15 ENCOUNTER — Inpatient Hospital Stay (HOSPITAL_COMMUNITY): Payer: Medicare Other

## 2012-11-15 ENCOUNTER — Encounter (HOSPITAL_COMMUNITY)
Admission: EM | Disposition: A | Discharge status: Skilled Nursing Facility | Payer: Self-pay | Source: Home / Self Care | Attending: Internal Medicine

## 2012-11-15 ENCOUNTER — Inpatient Hospital Stay (HOSPITAL_COMMUNITY): Payer: Medicare Other | Admitting: Anesthesiology

## 2012-11-15 ENCOUNTER — Encounter (HOSPITAL_COMMUNITY): Payer: Self-pay | Admitting: Anesthesiology

## 2012-11-15 DIAGNOSIS — R7989 Other specified abnormal findings of blood chemistry: Secondary | ICD-10-CM

## 2012-11-15 DIAGNOSIS — D72829 Elevated white blood cell count, unspecified: Secondary | ICD-10-CM

## 2012-11-15 DIAGNOSIS — R799 Abnormal finding of blood chemistry, unspecified: Secondary | ICD-10-CM

## 2012-11-15 HISTORY — PX: LUMBAR LAMINECTOMY/DECOMPRESSION MICRODISCECTOMY: SHX5026

## 2012-11-15 LAB — CBC
Hemoglobin: 13.2 g/dL (ref 13.0–17.0)
MCHC: 32.7 g/dL (ref 30.0–36.0)
WBC: 15.2 10*3/uL — ABNORMAL HIGH (ref 4.0–10.5)

## 2012-11-15 LAB — BASIC METABOLIC PANEL
BUN: 54 mg/dL — ABNORMAL HIGH (ref 6–23)
GFR calc Af Amer: 47 mL/min — ABNORMAL LOW (ref >90)
GFR calc non Af Amer: 41 mL/min — ABNORMAL LOW (ref >90)
Potassium: 4.9 mEq/L (ref 3.5–5.1)
Sodium: 132 mEq/L — ABNORMAL LOW (ref 135–145)

## 2012-11-15 LAB — GLUCOSE, CAPILLARY
Glucose-Capillary: 109 mg/dL — ABNORMAL HIGH (ref 70–99)
Glucose-Capillary: 137 mg/dL — ABNORMAL HIGH (ref 70–99)

## 2012-11-15 SURGERY — LUMBAR LAMINECTOMY/DECOMPRESSION MICRODISCECTOMY 1 LEVEL
Anesthesia: General | Site: Back | Laterality: Left | Wound class: Clean

## 2012-11-15 MED ORDER — DEXTROSE IN LACTATED RINGERS 5 % IV SOLN
INTRAVENOUS | Status: DC
  Administered 2012-11-16 – 2012-11-17 (×2): via INTRAVENOUS

## 2012-11-15 MED ORDER — FENTANYL CITRATE 0.05 MG/ML IJ SOLN
25.0000 ug | INTRAMUSCULAR | Status: DC | PRN
  Administered 2012-11-15: 50 ug via INTRAVENOUS

## 2012-11-15 MED ORDER — GLYCOPYRROLATE 0.2 MG/ML IJ SOLN
INTRAMUSCULAR | Status: DC | PRN
  Administered 2012-11-15: 0.6 mg via INTRAVENOUS

## 2012-11-15 MED ORDER — MENTHOL 3 MG MT LOZG: 1.0000 | LOZENGE | OROMUCOSAL | Status: DC | PRN

## 2012-11-15 MED ORDER — LACTATED RINGERS IV BOLUS (SEPSIS): 500.0000 mL | Freq: Once | INTRAVENOUS | Status: DC

## 2012-11-15 MED ORDER — PROMETHAZINE HCL 25 MG/ML IJ SOLN: 6.2500 mg | INTRAMUSCULAR | Status: DC | PRN

## 2012-11-15 MED ORDER — SODIUM CHLORIDE 0.9 % IV BOLUS (SEPSIS)
500.0000 mL | Freq: Once | INTRAVENOUS | Status: AC
  Administered 2012-11-15: 500 mL via INTRAVENOUS

## 2012-11-15 MED ORDER — PHENYLEPHRINE HCL 10 MG/ML IJ SOLN
INTRAMUSCULAR | Status: DC | PRN
  Administered 2012-11-15 (×3): 120 ug via INTRAVENOUS

## 2012-11-15 MED ORDER — THROMBIN 5000 UNITS EX SOLR
CUTANEOUS | Status: DC | PRN
  Administered 2012-11-15: 10000 [IU] via TOPICAL

## 2012-11-15 MED ORDER — SODIUM CHLORIDE 0.45 % IV BOLUS: 1000.0000 mL | Freq: Once | INTRAVENOUS | Status: DC

## 2012-11-15 MED ORDER — LACTATED RINGERS IV SOLN: INTRAVENOUS | Status: DC

## 2012-11-15 MED ORDER — SODIUM CHLORIDE 0.9 % IV SOLN
INTRAVENOUS | Status: DC
  Administered 2012-11-15: 20:00:00 via INTRAVENOUS
  Administered 2012-11-16: 75 mL/h via INTRAVENOUS
  Administered 2012-11-16 – 2012-11-17 (×2): via INTRAVENOUS

## 2012-11-15 MED ORDER — PROPOFOL 10 MG/ML IV BOLUS
INTRAVENOUS | Status: DC | PRN
  Administered 2012-11-15: 100 mg via INTRAVENOUS
  Administered 2012-11-15: 30 mg via INTRAVENOUS

## 2012-11-15 MED ORDER — ONDANSETRON HCL 4 MG PO TABS: 4.0000 mg | ORAL_TABLET | Freq: Four times a day (QID) | ORAL | Status: DC | PRN

## 2012-11-15 MED ORDER — LACTATED RINGERS IV SOLN
INTRAVENOUS | Status: DC
  Administered 2012-11-15: 19:00:00 via INTRAVENOUS

## 2012-11-15 MED ORDER — SODIUM CHLORIDE 0.9 % IV SOLN: 250.0000 mL | INTRAVENOUS | Status: DC

## 2012-11-15 MED ORDER — DEXTROSE 5 % IV SOLN
3.0000 g | Freq: Once | INTRAVENOUS | Status: AC
  Administered 2012-11-15: 3 g via INTRAVENOUS
  Filled 2012-11-15: qty 3000

## 2012-11-15 MED ORDER — ONDANSETRON HCL 4 MG/2ML IJ SOLN
4.0000 mg | INTRAMUSCULAR | Status: DC | PRN
  Administered 2012-11-17 – 2012-11-19 (×2): 4 mg via INTRAVENOUS

## 2012-11-15 MED ORDER — METOCLOPRAMIDE HCL 10 MG PO TABS: 5.0000 mg | ORAL_TABLET | Freq: Three times a day (TID) | ORAL | Status: DC | PRN

## 2012-11-15 MED ORDER — METOCLOPRAMIDE HCL 5 MG/ML IJ SOLN: 5.0000 mg | Freq: Three times a day (TID) | INTRAMUSCULAR | Status: DC | PRN

## 2012-11-15 MED ORDER — ONDANSETRON HCL 4 MG/2ML IJ SOLN
INTRAMUSCULAR | Status: DC | PRN
  Administered 2012-11-15: 4 mg via INTRAVENOUS

## 2012-11-15 MED ORDER — SODIUM CHLORIDE 0.9 % IJ SOLN: 3.0000 mL | Freq: Two times a day (BID) | INTRAMUSCULAR | Status: DC

## 2012-11-15 MED ORDER — CISATRACURIUM BESYLATE (PF) 10 MG/5ML IV SOLN
INTRAVENOUS | Status: DC | PRN
  Administered 2012-11-15: 4 mg via INTRAVENOUS
  Administered 2012-11-15: 2 mg via INTRAVENOUS

## 2012-11-15 MED ORDER — EPHEDRINE SULFATE 50 MG/ML IJ SOLN
INTRAMUSCULAR | Status: DC | PRN
  Administered 2012-11-15 (×2): 5 mg via INTRAVENOUS

## 2012-11-15 MED ORDER — CEFAZOLIN SODIUM-DEXTROSE 2-3 GM-% IV SOLR
2.0000 g | Freq: Four times a day (QID) | INTRAVENOUS | Status: AC
  Administered 2012-11-16 (×2): 2 g via INTRAVENOUS
  Filled 2012-11-15 (×2): qty 50

## 2012-11-15 MED ORDER — NEOSTIGMINE METHYLSULFATE 1 MG/ML IJ SOLN
INTRAMUSCULAR | Status: DC | PRN
  Administered 2012-11-15: 4 mg via INTRAVENOUS

## 2012-11-15 MED ORDER — SODIUM CHLORIDE 0.9 % IJ SOLN: 3.0000 mL | INTRAMUSCULAR | Status: DC | PRN

## 2012-11-15 MED ORDER — PHENYLEPHRINE HCL 10 MG/ML IJ SOLN
10.0000 mg | INTRAVENOUS | Status: DC | PRN
  Administered 2012-11-15: 20 ug/min via INTRAVENOUS

## 2012-11-15 MED ORDER — ONDANSETRON HCL 4 MG/2ML IJ SOLN: 4.0000 mg | Freq: Four times a day (QID) | INTRAMUSCULAR | Status: DC | PRN

## 2012-11-15 MED ORDER — LACTATED RINGERS IV SOLN
INTRAVENOUS | Status: DC | PRN
  Administered 2012-11-15 (×2): via INTRAVENOUS

## 2012-11-15 MED ORDER — PHENOL 1.4 % MT LIQD: 1.0000 | OROMUCOSAL | Status: DC | PRN

## 2012-11-15 MED ORDER — 0.9 % SODIUM CHLORIDE (POUR BTL) OPTIME
TOPICAL | Status: DC | PRN
  Administered 2012-11-15: 1000 mL

## 2012-11-15 MED ORDER — SUCCINYLCHOLINE CHLORIDE 20 MG/ML IJ SOLN
INTRAMUSCULAR | Status: DC | PRN
  Administered 2012-11-15: 100 mg via INTRAVENOUS

## 2012-11-15 SURGICAL SUPPLY — 49 items
APL SKNCLS STERI-STRIP NONHPOA (GAUZE/BANDAGES/DRESSINGS) ×1
BAG SPEC THK2 15X12 ZIP CLS (MISCELLANEOUS)
BAG ZIPLOCK 12X15 (MISCELLANEOUS) ×1 IMPLANT
BENZOIN TINCTURE PRP APPL 2/3 (GAUZE/BANDAGES/DRESSINGS) ×2 IMPLANT
BUR EGG ELITE 4.0 (BURR) ×1 IMPLANT
CLEANER TIP ELECTROSURG 2X2 (MISCELLANEOUS) ×2 IMPLANT
CLOTH BEACON ORANGE TIMEOUT ST (SAFETY) ×2 IMPLANT
CONT SPEC 4OZ CLIKSEAL STRL BL (MISCELLANEOUS) ×2 IMPLANT
DRAIN PENROSE 18X1/4 LTX STRL (WOUND CARE) IMPLANT
DRAPE MICROSCOPE LEICA (MISCELLANEOUS) ×2 IMPLANT
DRAPE POUCH INSTRU U-SHP 10X18 (DRAPES) ×2 IMPLANT
DRAPE SURG 17X11 SM STRL (DRAPES) ×2 IMPLANT
DRSG ADAPTIC 3X8 NADH LF (GAUZE/BANDAGES/DRESSINGS) ×2 IMPLANT
DRSG EMULSION OIL 3X16 NADH (GAUZE/BANDAGES/DRESSINGS) ×1 IMPLANT
DRSG PAD ABDOMINAL 8X10 ST (GAUZE/BANDAGES/DRESSINGS) ×2 IMPLANT
DURAPREP 26ML APPLICATOR (WOUND CARE) ×2 IMPLANT
ELECT BLADE TIP CTD 4 INCH (ELECTRODE) ×2 IMPLANT
ELECT REM PT RETURN 9FT ADLT (ELECTROSURGICAL) ×2
ELECTRODE REM PT RTRN 9FT ADLT (ELECTROSURGICAL) ×1 IMPLANT
GLOVE BIO SURGEON STRL SZ8 (GLOVE) ×2 IMPLANT
GLOVE BIOGEL PI IND STRL 8 (GLOVE) IMPLANT
GLOVE BIOGEL PI INDICATOR 8 (GLOVE) ×1
GLOVE ECLIPSE 8.0 STRL XLNG CF (GLOVE) ×3 IMPLANT
GLOVE INDICATOR 8.0 STRL GRN (GLOVE) ×3 IMPLANT
GLOVE SURG SS PI 8.5 STRL IVOR (GLOVE) ×2
GLOVE SURG SS PI 8.5 STRL STRW (GLOVE) IMPLANT
GOWN STRL REIN XL XLG (GOWN DISPOSABLE) ×4 IMPLANT
KIT BASIN OR (CUSTOM PROCEDURE TRAY) ×2 IMPLANT
KIT POSITIONING SURG ANDREWS (MISCELLANEOUS) ×2 IMPLANT
MANIFOLD NEPTUNE II (INSTRUMENTS) ×2 IMPLANT
NDL SPNL 18GX3.5 QUINCKE PK (NEEDLE) ×3 IMPLANT
NEEDLE SPNL 18GX3.5 QUINCKE PK (NEEDLE) ×6 IMPLANT
NS IRRIG 1000ML POUR BTL (IV SOLUTION) ×2 IMPLANT
PATTIES SURGICAL .5 X.5 (GAUZE/BANDAGES/DRESSINGS) IMPLANT
PATTIES SURGICAL .75X.75 (GAUZE/BANDAGES/DRESSINGS) IMPLANT
PATTIES SURGICAL 1X1 (DISPOSABLE) IMPLANT
POSITIONER SURGICAL ARM (MISCELLANEOUS) ×2 IMPLANT
SPONGE GAUZE 4X4 12PLY (GAUZE/BANDAGES/DRESSINGS) ×1 IMPLANT
SPONGE LAP 4X18 X RAY DECT (DISPOSABLE) ×1 IMPLANT
SPONGE SURGIFOAM ABS GEL 100 (HEMOSTASIS) ×2 IMPLANT
STAPLER VISISTAT 35W (STAPLE) ×1 IMPLANT
SUT VIC AB 1 CT1 27 (SUTURE) ×4
SUT VIC AB 1 CT1 27XBRD ANTBC (SUTURE) ×2 IMPLANT
SUT VIC AB 2-0 CT1 27 (SUTURE) ×4
SUT VIC AB 2-0 CT1 27XBRD (SUTURE) ×2 IMPLANT
TAPE CLOTH SURG 6X10 WHT LF (GAUZE/BANDAGES/DRESSINGS) ×1 IMPLANT
TOWEL OR 17X26 10 PK STRL BLUE (TOWEL DISPOSABLE) ×4 IMPLANT
TRAY LAMINECTOMY (CUSTOM PROCEDURE TRAY) ×2 IMPLANT
WATER STERILE IRR 1500ML POUR (IV SOLUTION) ×1 IMPLANT

## 2012-11-15 NOTE — Brief Op Note (Signed)
11/09/2012 - 11/15/2012  6:35 PM  PATIENT:  [name]  79 y.o. male  PRE-OPERATIVE DIAGNOSIS:  Herniated disc  POST-OPERATIVE DIAGNOSIS:  herniated disc  PROCEDURE:  Procedure(s) with comments: MICRODISCECTOMY L4 - L5 on the LEFT  1 LEVEL (Left) - REPEAT DECOMPRESSION LAMINECTOMY L4-L5 LEFT/INSPECTION L4-L5 DISC  SURGEON:  Surgeon(s) and Role:    * Tobi Bastos, MD - Assisting    * [name], MD - Primary  PHYSICIAN ASSISTANT:   ASSISTANTS: nurse   ANESTHESIA:   general  EBL:  Total I/O In: 1900 [I.V.:1850; IV Piggyback:50] Out: 480 [Urine:430; Blood:50]  BLOOD ADMINISTERED:none  DRAINS: none   LOCAL MEDICATIONS USED:  NONE  SPECIMEN:  No Specimen  DISPOSITION OF SPECIMEN:  N/A  COUNTS:  YES  TOURNIQUET:  * No tourniquets in log *none  DICTATION: .Other Dictation: Dictation Number [id number]  PLAN OF CARE: Admit to inpatient   PATIENT DISPOSITION:  PACU - hemodynamically stable.   Delay start of Pharmacological VTE agent (>24hrs) due to surgical blood loss or risk of bleeding: yes

## 2012-11-15 NOTE — Progress Notes (Signed)
Discussed in long length of stay meeting.

## 2012-11-15 NOTE — Op Note (Signed)
Talked with Dr Tonita Cong, aware of change in care for pt and ok with this

## 2012-11-15 NOTE — Anesthesia Postprocedure Evaluation (Signed)
Anesthesia Post Note  Patient: [name]  Procedure(s) Performed: Procedure(s) (LRB): MICRODISCECTOMY L4 - L5 on the LEFT  1 LEVEL (Left)  Anesthesia type: General  Patient location: PACU  Post pain: Pain level controlled  Post assessment: Post-op Vital signs reviewed  Last Vitals:  Filed Vitals:   11/15/12 1946  BP:   Pulse:   Temp: 36.6 C  Resp:     Post vital signs: Reviewed  Level of consciousness: sedated  Complications: No apparent anesthesia complications

## 2012-11-15 NOTE — Progress Notes (Signed)
[facility] PROGRESS NOTE  [name] [id number] DOB: [birth year] DOA: 11/09/2012 PCP: No primary provider on file.  Assessment/Plan: Acute back pain, initially consistent with sciatica, however, he developed severe left lumbar pain that with palpation causing pain to radiate down leg. MRI demonstrates protrusion of disc causing compression of the left L5 n. Root consistent with patient's pain. Pain better controlled on NSAIDS, gabapentin, steroids, and oral and IV narcotics and patient. - [place] planning to OR on 3/20 for microdiscectomy - Continue naprosyn - Continue gabapentin - Cont medrol dose pack - Continue narcotics - PT has signed off   - Sensation intact at lower extremities  DM- - Continue oral antihyperglycemics - SSI decreased to mod dose - Blood sugars relatively well controlled at this juncture.  Obesity- contributing to his back pain.   Constipation- relieved after golytely - Continue stool softeners   HTN, BP very elevated, likely due to pain and steroids. Minimal change despite addition of hydralazine.  - Will hold lisinopril and hctz due to soft blood pressures this am. -  Continue labetalol - cont hydralazine prn - Anticipate that many of these meds can be discontinued after steroids tapered off   Leukocytosis, most likely related to medrol dose pack. No source of infection identified and patient remains afebrile. - Most likely due to the prednisone - appears non toxic and is afebrile  Elevated creatinine - will hold nephrotoxic agents (lisinopril and NSAID) - reassess next am.   Code Status:full Family Communication: no family at bedside.  Disposition Plan: Pending specialist recommendations.   Consultants:  Ortho  Procedures:  Refer to above  Antibiotics:  none  HPI/Subjective: No new complaints.  No acute issues reported overnight.  Objective: Filed Vitals:   11/14/12 1339 11/14/12 1541 11/14/12 2200 11/15/12 0600  BP:  142/71 160/83 144/66 96/44  Pulse: 83 91 91 87  Temp: 97.7 F (36.5 C)  97.8 F (36.6 C) 98.5 F (36.9 C)  TempSrc: Oral  Oral Oral  Resp: 18  18 18   Height:      Weight:      SpO2: 93%  94% 91%    Intake/Output Summary (Last 24 hours) at 11/15/12 1139 Last data filed at 11/15/12 0908  Gross per 24 hour  Intake    240 ml  Output   1030 ml  Net   -790 ml   Filed Weights   11/09/12 1634 11/14/12 1020  Weight: 135.943 kg (299 lb 11.2 oz) 123.514 kg (272 lb 4.8 oz)    Exam:   General:  Pt in NAD, Alert and Awake  Cardiovascular: RRR, No MRG  Respiratory: CTA BL, no wheezes  Abdomen: soft, obese, NT,   Musculoskeletal: no cyanosis or clubbing.   Data Reviewed: Basic Metabolic Panel:  Recent Labs Lab 11/11/12 0453 11/12/12 0440 11/13/12 0440 11/14/12 0440 11/15/12 0440  NA 135 137 136 135 132*  K 4.4 4.4 4.3 4.4 4.9  CL 97 98 98 96 95*  CO2 31 30 30 30 28   GLUCOSE 206* 186* 145* 130* 168*  BUN 23 27* 33* 37* 54*  CREATININE 0.96 0.98 1.13 1.29 1.56*  CALCIUM 9.3 9.2 9.3 9.3 9.0   Liver Function Tests: No results found for this basename: AST, ALT, ALKPHOS, BILITOT, PROT, ALBUMIN,  in the last 168 hours No results found for this basename: LIPASE, AMYLASE,  in the last 168 hours No results found for this basename: AMMONIA,  in the last 168 hours CBC:  Recent Labs Lab 11/11/12  [id number] 11/12/12 0440 11/13/12 0440 11/14/12 0440 11/15/12 0440  WBC 12.6* 13.7* 13.7* 11.5* 15.2*  HGB 13.9 14.2 14.1 14.0 13.2  HCT 43.2 44.3 44.2 43.5 40.4  MCV 89.1 89.5 89.7 90.2 89.4  PLT 356 338 346 321 286   Cardiac Enzymes: No results found for this basename: CKTOTAL, CKMB, CKMBINDEX, TROPONINI,  in the last 168 hours BNP (last 3 results) No results found for this basename: PROBNP,  in the last 8760 hours CBG:  Recent Labs Lab 11/14/12 0728 11/14/12 1131 11/14/12 1636 11/14/12 2112 11/15/12 0745  GLUCAP 112* 139* 116* 182* 137*    Recent Results (from the  past 240 hour(s))  SURGICAL PCR SCREEN     Status: Abnormal   Collection Time    11/13/12  3:17 PM      Result Value Range Status   MRSA, PCR NEGATIVE  NEGATIVE Final   Staphylococcus aureus POSITIVE (*) NEGATIVE Final   Comment:            The Xpert SA Assay (FDA     approved for NASAL specimens     in patients over 71 years of age),     is one component of     a comprehensive surveillance     program.  Test performance has     been validated by [employer] for patients greater     than or equal to 51 year old.     It is not intended     to diagnose infection nor to     guide or monitor treatment.     Studies: No results found.  Scheduled Meds: . aspirin  325 mg Oral Daily  . docusate sodium  200 mg Oral BID  . enoxaparin (LOVENOX) injection  70 mg Subcutaneous Q24H  . gabapentin  300 mg Oral TID  . galantamine  8 mg Oral BID WC  . glyBURIDE  10 mg Oral Q breakfast  . glyBURIDE  5 mg Oral QHS  . hydrALAZINE  25 mg Oral Q8H  . hydrochlorothiazide  25 mg Oral Daily  . insulin aspart  0-15 Units Subcutaneous TID WC  . insulin aspart  0-5 Units Subcutaneous QHS  . labetalol  100 mg Oral TID  . lisinopril  20 mg Oral Daily  . naproxen  500 mg Oral BID WC  . pioglitazone  30 mg Oral Daily  . polyethylene glycol  17 g Oral Daily  . polyvinyl alcohol  1 drop Both Eyes BID  . simvastatin  40 mg Oral q1800  . sodium chloride  500 mL Intravenous Once  . traZODone  100 mg Oral QHS  . venlafaxine XR  150 mg Oral QPC supper   Continuous Infusions: . sodium chloride      Principal Problem:   Acute back pain Active Problems:   Diabetes   History of back surgery   Obesity   Leukocytosis, unspecified   Lumbosacral radiculopathy at L5   Herniated lumbar intervertebral disc   Hyperglycemia   Essential hypertension, benign   Unspecified constipation    Time spent: > 35 minutes    Velvet Bathe  [facility] Pager [phone]. If 7PM-7AM, please contact  night-coverage at [email or web address], password [facility] 11/15/2012, 11:39 AM  LOS: 6 days

## 2012-11-15 NOTE — H&P (Signed)
  He has had no health changes since yesterday.  Will proceed with surgery as planned and cover him with antibiotics

## 2012-11-15 NOTE — Preoperative (Signed)
Beta Blockers   Reason not to administer Beta Blockers:Not Applicable 

## 2012-11-15 NOTE — Transfer of Care (Signed)
Immediate Anesthesia Transfer of Care Note  Patient: [name]  Procedure(s) Performed: Procedure(s) with comments: MICRODISCECTOMY L4 - L5 on the LEFT  1 LEVEL (Left) - REPEAT DECOMPRESSION LAMINECTOMY L4-L5 LEFT/INSPECTION L4-L5 DISC  Patient Location: PACU  Anesthesia Type:General  Level of Consciousness: awake and sedated  Airway & Oxygen Therapy: Patient Spontanous Breathing and Patient connected to face mask oxygen  Post-op Assessment: Report given to PACU RN, Post -op Vital signs reviewed and stable and Patient moving all extremities  Post vital signs: Reviewed and stable  Complications: No apparent anesthesia complications

## 2012-11-15 NOTE — Anesthesia Preprocedure Evaluation (Addendum)
Anesthesia Evaluation  Patient identified by MRN, date of birth, ID band Patient awake    Reviewed: Allergy & Precautions, H&P , NPO status , Patient's Chart, lab work & pertinent test results  Airway Mallampati: III TM Distance: >3 FB Neck ROM: Full    Dental  (+) Edentulous Upper and Edentulous Lower   Pulmonary neg pulmonary ROS,  breath sounds clear to auscultation  Pulmonary exam normal       Cardiovascular hypertension, Pt. on medications and Pt. on home beta blockers negative cardio ROS  Rhythm:Regular Rate:Normal     Neuro/Psych PSYCHIATRIC DISORDERS  Neuromuscular disease negative psych ROS   GI/Hepatic negative GI ROS, Neg liver ROS,   Endo/Other  diabetes, Well Controlled, Type 2, Oral Hypoglycemic AgentsMorbid obesity  Renal/GU negative Renal ROS  negative genitourinary   Musculoskeletal negative musculoskeletal ROS (+)   Abdominal (+) + obese,   Peds  Hematology negative hematology ROS (+)   Anesthesia Other Findings Patient appeared a bit agitated, moving about bed at time of exam. Oriented X 3.   Reproductive/Obstetrics negative OB ROS                        Anesthesia Physical Anesthesia Plan  ASA: III  Anesthesia Plan: General   Post-op Pain Management:    Induction: Intravenous  Airway Management Planned: Oral ETT  Additional Equipment:   Intra-op Plan:   Post-operative Plan: Extubation in OR  Informed Consent: I have reviewed the patients History and Physical, chart, labs and discussed the procedure including the risks, benefits and alternatives for the proposed anesthesia with the patient or authorized representative who has indicated his/her understanding and acceptance.   Dental advisory given  Plan Discussed with: CRNA  Anesthesia Plan Comments:         Anesthesia Quick Evaluation

## 2012-11-16 ENCOUNTER — Encounter (HOSPITAL_COMMUNITY): Payer: Self-pay | Admitting: Orthopedic Surgery

## 2012-11-16 DIAGNOSIS — I959 Hypotension, unspecified: Secondary | ICD-10-CM

## 2012-11-16 DIAGNOSIS — K5900 Constipation, unspecified: Secondary | ICD-10-CM

## 2012-11-16 LAB — GLUCOSE, CAPILLARY
Glucose-Capillary: 193 mg/dL — ABNORMAL HIGH (ref 70–99)
Glucose-Capillary: 209 mg/dL — ABNORMAL HIGH (ref 70–99)
Glucose-Capillary: 163 mg/dL — ABNORMAL HIGH (ref 70–99)

## 2012-11-16 LAB — CBC
HCT: 39.4 % (ref 39.0–52.0)
Hemoglobin: 12.6 g/dL — ABNORMAL LOW (ref 13.0–17.0)
MCH: 29 pg (ref 26.0–34.0)
MCHC: 32 g/dL (ref 30.0–36.0)
MCV: 90.8 fL (ref 78.0–100.0)

## 2012-11-16 LAB — BASIC METABOLIC PANEL
CO2: 29 mEq/L (ref 19–32)
Calcium: 8.8 mg/dL (ref 8.4–10.5)
GFR calc non Af Amer: 44 mL/min — ABNORMAL LOW (ref >90)
Sodium: 134 mEq/L — ABNORMAL LOW (ref 135–145)

## 2012-11-16 NOTE — Op Note (Signed)
NAMESIGMUND, MINNIEFIELD               ACCOUNT NO.:  [id number]  MEDICAL RECORD NO.:  [id number]  LOCATION:  [id number]                         FACILITY:  [facility]  PHYSICIAN:  Tarri Glenn, M.D.  DATE OF BIRTH:  [birth year]  DATE OF PROCEDURE:  11/15/2012 DATE OF DISCHARGE:                              OPERATIVE REPORT   PREOPERATIVE DIAGNOSES: 1. Recurrent HNP, L4-5, left. 2. Spondylosis of L4-5, left.  POSTOPERATIVE DIAGNOSIS:  Spondylosis with compression of L5 nerve root, L4-5 left.  OPERATION:  Re-exploration of L4-5 interspace with lateral decompression and decompression of the L5 nerve root.  SURGEON:  Tarri Glenn, M.D.  ASSISTANT:  Kipp Brood. Gladstone Lighter, M.D.  ANESTHESIA:  General.  PATHOLOGY AND JUSTIFICATION FOR PROCEDURE:  He has had 3 prior back operations.  He was admitted at this time with severe left leg pain and some numbness in his left great toe, and some slight weakness in his great toe and foot.  The MRI with gadolinium has demonstrated what appears to be recurrent disk herniation L4-5 on the left central and to the left.  Because of failure to respond to bedrest and medications, he is here for the above-mentioned surgery.  PROCEDURE:  Prophylactic antibiotics, satisfied general anesthesia, knee- chest position on the Wellington frame.  Back was prepped with DuraPrep, draped in sterile field.  A time-out performed.  Three needles were placed on the lower lumbar spine on lateral x-ray indicating the L4-5 interspace.  Based on this, I made an incision at this level, excising the prior surgical scar.  He had some residual spinous process of L3 and using that as a landmark, we began dissecting soft tissue off it and the lamina of L3 working distally.  It appeared he had basically decompressive laminectomy at L4-5 and accordingly we found the lateral margin of bone.  Working most of the time with the microscope and using curettes, 2 and 3-mm Kerrison rongeurs carefully  removed the soft tissue working to the midline but after placing the self-retaining McCullough retractors.  The inferior portion of the decompression, I was able to find the interface between bone and previous surgery, and it was a small curette, detached some of the interface soft tissue and then brought in a 2-mm Kerrison rongeur working on the lateral perimeter of the bone and patiently decompressing the spinal canal.  On the way, we took 2 localizing lateral x-rays in the operating room.  The last x-ray clearly demarcated that with a hockey-stick was into the neural foramen for the L5 nerve root.  Apparently at this time, we had thoroughly decompressed the L5 nerve root and the dura on the lateral side.  We were able to move the dura and nerve root medially and it appeared that what was visualized on the MRI even with gadolinium this mainly a combination of ligamentum flavum and reactive fibrous tissue.  The disk appeared to be flat.  From a practical standpoint, we had thoroughly decompressed the dura in the L5 nerve root and the foramen superiorly, it was also patent to hockey-stick, but we felt like we completed the operation.  We irrigated the wound well with sterile saline and placed Gelfoam soaked in thrombin  over the laminectomy defect.  I then closed the wound in layers with interrupted #1 Vicryl in the fascia and deep subcutaneous tissue, 2-0 Vicryl in the subcutaneous tissue, and staples in the skin. Betadine, Adaptic, dry sterile dressing were applied.  He was taken to the recovery room in satisfactory condition with no known complications. Estimated blood loss was 50 mL.  No blood replacement.          ______________________________ Tarri Glenn, M.D.     JA/MEDQ  D:  11/15/2012  T:  11/16/2012  Job:  [id number]

## 2012-11-16 NOTE — Plan of Care (Signed)
Problem: Phase II Progression Outcomes Goal: Progress activity as tolerated unless otherwise ordered Outcome: Not Progressing Limited by pain

## 2012-11-16 NOTE — Clinical Social Work Psychosocial (Signed)
Clinical Social Work Department BRIEF PSYCHOSOCIAL ASSESSMENT 11/16/2012  Patient:  [name], [name]     Account Number:  [id number]     Admit date:  11/09/2012  Clinical Social Worker:  Lanell Matar  Date/Time:  11/16/2012 12:06 PM  Referred by:  Physician  Date Referred:  11/16/2012 Referred for  SNF Placement   Other Referral:   Interview type:  Family Other interview type:   chart review, discussion with wife.    PSYCHOSOCIAL DATA Living Status:  WIFE Admitted from facility:   Level of care:   Primary support name:  [name] Primary support relationship to patient:  SPOUSE Degree of support available:   good    CURRENT CONCERNS Current Concerns  Post-Acute Placement   Other Concerns:    SOCIAL WORK ASSESSMENT / PLAN CSW met with wife at the bedside to check in and discuss possible need for SNF. Wife states Pt wasnt walking at home prior to admission. She feels she cannot handle Pt at home if not still walking.  CSW explained SNF process and provided SNF list. Wife most interested in [facility] as Pt's sister was there in the past and they both liked it.   Assessment/plan status:  Psychosocial Support/Ongoing Assessment of Needs Other assessment/ plan:   SNF placement   Information/referral to community resources:   SNF list provided.    PATIENT'S/FAMILY'S RESPONSE TO PLAN OF CARE: CSW to begin SNF search. Wife agrees. CSW to follow and assist with SNF.    Loren Racer, LCSW ICU/Stepdown Clinical Social Worker [facility] Cell [phone] Hours 8am-1200pm M-F

## 2012-11-16 NOTE — Evaluation (Signed)
Physical Therapy Evaluation Patient Details Name: RAYNALD PARIS MRN: [id number] DOB: 1932-08-27 Today's Date: 11/16/2012 Time: [id number] PT Time Calculation (min): 22 min  PT Assessment / Plan / Recommendation Clinical Impression  Pt s/p lumbar decompression presents with severe pain on attempts to move limiting functional mobility    PT Assessment  Patient needs continued PT services    Follow Up Recommendations  SNF    Does the patient have the potential to tolerate intense rehabilitation      Barriers to Discharge Inaccessible home environment (13 stairs to bedroom)      Equipment Recommendations       Recommendations for Other Services OT consult   Frequency Min 5X/week    Precautions / Restrictions Precautions Precautions: Back Restrictions Weight Bearing Restrictions: No   Pertinent Vitals/Pain 10/10; premedicated, RN aware      Mobility  Bed Mobility Bed Mobility: Rolling Right Rolling Right:  (rolled once with supervision but doesn't consistently follow) Details for Bed Mobility Assistance: Attempting to cue pt for safer more appropriate movement but pt with following minimally and thrashing 2* ain Transfers Transfers: Not assessed Ambulation/Gait Ambulation/Gait Assistance: Not tested (comment)    Exercises     PT Diagnosis: Difficulty walking;Acute pain  PT Problem List: Decreased strength;Decreased range of motion;Decreased activity tolerance;Decreased mobility;Pain;Obesity;Decreased knowledge of use of DME PT Treatment Interventions: DME instruction;Gait training;Stair training;Functional mobility training;Therapeutic activities;Therapeutic exercise;Patient/family education   PT Goals Acute Rehab PT Goals PT Goal Formulation: Patient unable to participate in goal setting Time For Goal Achievement: 11/27/12 Potential to Achieve Goals: Fair Pt will Roll Supine to Right Side: with supervision PT Goal: Rolling Supine to Right Side - Progress: Goal  set today Pt will Roll Supine to Left Side: with supervision PT Goal: Rolling Supine to Left Side - Progress: Goal set today Pt will go Supine/Side to Sit: with supervision PT Goal: Supine/Side to Sit - Progress: Goal set today Pt will Sit at Edge of Bed: with supervision PT Goal: Sit at Edge Of Bed - Progress: Goal set today Pt will go Sit to Supine/Side: with supervision PT Goal: Sit to Supine/Side - Progress: Goal set today Pt will go Sit to Stand: with supervision PT Goal: Sit to Stand - Progress: Goal set today Pt will go Stand to Sit: with supervision PT Goal: Stand to Sit - Progress: Goal set today Pt will Ambulate: 16 - 50 feet;with supervision;with rolling walker PT Goal: Ambulate - Progress: Goal set today Pt will Go Up / Down Stairs: Flight;with min assist;with least restrictive assistive device PT Goal: Up/Down Stairs - Progress: Goal set today  Visit Information  Last PT Received On: 11/16/12 Assistance Needed: +2 PT/OT Co-Evaluation/Treatment: Yes    Subjective Data  Subjective: My shoulder hurts, my side hurts, my legs hurt.................   Prior Functioning  Home Living Lives With: Spouse Available Help at Discharge: Family Type of Home: House Home Access: Stairs to enter [employer] of Steps: 13 Entrance Stairs-Rails: Right Home Layout: Two level Bathroom Shower/Tub: Chiropodist: [place]: Walker - rolling;Straight cane;Bedside commode/3-in-1 Communication Communication: No difficulties    Cognition  Cognition Overall Cognitive Status: Impaired Area of Impairment: Following commands;Safety/judgement Arousal/Alertness:  (eyes closed--but followed some commands) Behavior During Session:  (initially lethargic then restless due to pain) Following Commands: Follows one step commands inconsistently Safety/Judgement: Decreased awareness of safety precautions;Decreased safety judgement for tasks assessed     Extremity/Trunk Assessment Right Upper Extremity Assessment RUE ROM/Strength/Tone: Within functional levels (lifted beyond  90 (as cued).  c/o R shoulder pain with rollin) Left Upper Extremity Assessment LUE ROM/Strength/Tone: Within functional levels Right Lower Extremity Assessment RLE ROM/Strength/Tone: [facility] for tasks assessed Left Lower Extremity Assessment LLE ROM/Strength/Tone: WFL for tasks assessed   Balance    End of Session PT - End of Session Activity Tolerance: Patient limited by pain Patient left: in bed;with call bell/phone within reach;with nursing in room Nurse Communication: Patient requests pain meds;Mobility status  GP     Fernando Torry 11/16/2012, 1:16 PM

## 2012-11-16 NOTE — Progress Notes (Signed)
Patient ID: [name], male   DOB: [birth year], 79 y.o.   MRN: [id number] POD 1--early this am he had basically no pain but good bit of back pain only this pm.  I explained to his wife that the absence of his severe pre-op leg pain indicated that  His nerve had been well decompressed.  His blood pressure has been low so he has been on the stepdown unit for now.  This is being monitored by medicine. down unit

## 2012-11-16 NOTE — Progress Notes (Signed)
[facility] PROGRESS NOTE  WAYMOND STOLTENBERG [id number] DOB: 03/11/1932 DOA: 11/09/2012 PCP: No primary provider on file.  Assessment/Plan: Acute back pain, initially consistent with sciatica, however, he developed severe left lumbar pain that with palpation causing pain to radiate down leg. MRI demonstrates protrusion of disc causing compression of the left L5 n. Root consistent with patient's pain. Pain better controlled on NSAIDS, gabapentin, steroids, and oral and IV narcotics and patient. - [place] to OR on 3/20 for microdiscectomy with L4-L5 interspace with  - Pain control deferred to surgeons.  DM- - Continue oral antihyperglycemics - SSI decreased to mod dose - Blood sugars relatively well controlled at this juncture.  Obesity- contributing to his back pain.   Constipation- relieved after golytely - Continue stool softener dulcolax and colace  Hypotension  - During operation developed hypotension requiring pressors. - Will discontinue hydralazine, B blocker and lisinopril - reassess and watch in ICU throughout the course of the day - Will leave hydralazine 10 mg IV PRN for elevated Blood pressures  Leukocytosis, most likely related to medrol dose pack. No source of infection identified and patient remains afebrile. - Most likely due to the prednisone and recent operation - appears non toxic and is afebrile  Elevated creatinine - will hold nephrotoxic agents (lisinopril and NSAID) - Order bmp for today and next am.   Code Status:full Family Communication: Wife at bedside.  Disposition Plan: Pending specialist recommendations and continued improvement in condition.   Consultants:  Ortho: Dr. Shellia Carwin  Procedures:  Refer to above  Antibiotics:  none  HPI/Subjective: Patient was transferred to icu due to lower blood pressures.  At this juncture patient has no complaints.  Blood pressures reportedly improved.  Objective: Filed Vitals:   11/16/12  0315 11/16/12 0400 11/16/12 0546 11/16/12 0600  BP:  115/51 131/51   Pulse: 83 113  95  Temp:  98.6 F (37 C)    TempSrc:  Oral    Resp: 13 18  20   Height:      Weight:      SpO2: 93% 98%  96%    Intake/Output Summary (Last 24 hours) at 11/16/12 0841 Last data filed at 11/16/12 0600  Gross per 24 hour  Intake 5488.42 ml  Output   1680 ml  Net 3808.42 ml   Filed Weights   11/09/12 1634 11/14/12 1020 11/15/12 2100  Weight: 135.943 kg (299 lb 11.2 oz) 123.514 kg (272 lb 4.8 oz) 133.1 kg (293 lb 6.9 oz)    Exam:   General:  Pt in NAD, Alert and Awake  Cardiovascular: RRR, No MRG  Respiratory: CTA BL, no wheezes  Abdomen: soft, obese, NT,   Musculoskeletal: no cyanosis or clubbing.   Data Reviewed: Basic Metabolic Panel:  Recent Labs Lab 11/11/12 0453 11/12/12 0440 11/13/12 0440 11/14/12 0440 11/15/12 0440  NA 135 137 136 135 132*  K 4.4 4.4 4.3 4.4 4.9  CL 97 98 98 96 95*  CO2 31 30 30 30 28   GLUCOSE 206* 186* 145* 130* 168*  BUN 23 27* 33* 37* 54*  CREATININE 0.96 0.98 1.13 1.29 1.56*  CALCIUM 9.3 9.2 9.3 9.3 9.0   Liver Function Tests: No results found for this basename: AST, ALT, ALKPHOS, BILITOT, PROT, ALBUMIN,  in the last 168 hours No results found for this basename: LIPASE, AMYLASE,  in the last 168 hours No results found for this basename: AMMONIA,  in the last 168 hours CBC:  Recent Labs Lab 11/12/12 0440 11/13/12  [id number] 11/14/12 0440 11/15/12 0440 11/16/12 0333  WBC 13.7* 13.7* 11.5* 15.2* 17.4*  HGB 14.2 14.1 14.0 13.2 12.6*  HCT 44.3 44.2 43.5 40.4 39.4  MCV 89.5 89.7 90.2 89.4 90.8  PLT 338 346 321 286 274   Cardiac Enzymes: No results found for this basename: CKTOTAL, CKMB, CKMBINDEX, TROPONINI,  in the last 168 hours BNP (last 3 results) No results found for this basename: PROBNP,  in the last 8760 hours CBG:  Recent Labs Lab 11/15/12 0745 11/15/12 1231 11/15/12 1838 11/15/12 2128 11/16/12 0746  GLUCAP 137* 109* 118*  137* 193*    Recent Results (from the past 240 hour(s))  SURGICAL PCR SCREEN     Status: Abnormal   Collection Time    11/13/12  3:17 PM      Result Value Range Status   MRSA, PCR NEGATIVE  NEGATIVE Final   Staphylococcus aureus POSITIVE (*) NEGATIVE Final   Comment:            The Xpert SA Assay (FDA     approved for NASAL specimens     in patients over 15 years of age),     is one component of     a comprehensive surveillance     program.  Test performance has     been validated by [employer] for patients greater     than or equal to 56 year old.     It is not intended     to diagnose infection nor to     guide or monitor treatment.     Studies: Dg Spine Portable 1 View  11/15/2012  *RADIOLOGY REPORT*  Clinical Data: Lumbar laminectomy at L4-L5.  PORTABLE SPINE - 1 VIEW  Comparison: 11/15/2012  Findings: Surgical instrument is posterior to the L5 vertebral body.  Endplate degenerative changes in the lower lumbar spine. Alignment of the lumbar spine is stable.  IMPRESSION: Surgical marking along the posterior L5 vertebral body.   Original Report Authenticated By: Markus Daft, M.D.    Dg Spine Portable 1 View  11/15/2012  *RADIOLOGY REPORT*  Clinical Data: Intraoperative localization for spine surgery.  PORTABLE SPINE - 1 VIEW  Comparison: Earlier film, same date.  Findings: There is a surgical instrument marking the L4-5 level.  IMPRESSION: L4-5 marked intraoperatively.   Original Report Authenticated By: Marijo Sanes, M.D.    Dg Spine Portable 1 View  11/15/2012  *RADIOLOGY REPORT*  Clinical Data: Back pain.  PORTABLE SPINE - 1 VIEW  Comparison: 11/11/2012.  Findings: Five lumbar type vertebral bodies are present.  Three needles have been inserted from a posterior approach.  The  upper needle tip lies roughly 4 cm posterior to the spinous process of L4.    The middle needle is directed roughly parallel to the L5-S1 interspace.  The lower needle is directed towards the midbody of  S1.  IMPRESSION: Intraoperative spine radiograph as described.   Original Report Authenticated By: Rolla Flatten, M.D.     Scheduled Meds: . aspirin  325 mg Oral Daily  . docusate sodium  200 mg Oral BID  . gabapentin  300 mg Oral TID  . galantamine  8 mg Oral BID WC  . glyBURIDE  10 mg Oral Q breakfast  . glyBURIDE  5 mg Oral QHS  . hydrALAZINE  25 mg Oral Q8H  . insulin aspart  0-15 Units Subcutaneous TID WC  . insulin aspart  0-5 Units Subcutaneous QHS  . labetalol  100  mg Oral TID  . lactated ringers  500 mL Intravenous Once  . lactated ringers  500 mL Intravenous Once  . pioglitazone  30 mg Oral Daily  . polyethylene glycol  17 g Oral Daily  . polyvinyl alcohol  1 drop Both Eyes BID  . simvastatin  40 mg Oral q1800  . sodium chloride  1,000 mL Intravenous Once  . sodium chloride  3 mL Intravenous Q12H  . traZODone  100 mg Oral QHS  . venlafaxine XR  150 mg Oral QPC supper   Continuous Infusions: . sodium chloride 75 mL/hr at 11/16/12 0239  . sodium chloride    . dextrose 5% lactated ringers 100 mL/hr at 11/16/12 0044  . lactated ringers      Principal Problem:   Acute back pain Active Problems:   Diabetes   History of back surgery   Obesity   Leukocytosis, unspecified   Lumbosacral radiculopathy at L5   Herniated lumbar intervertebral disc   Hyperglycemia   Essential hypertension, benign   Unspecified constipation   Elevated serum creatinine    Time spent: > 35 minutes    Punam Broussard, [place] Pager [phone]. If 7PM-7AM, please contact night-coverage at [email or web address], password [facility] 11/16/2012, 8:41 AM  LOS: 7 days

## 2012-11-16 NOTE — Progress Notes (Signed)
CARE MANAGEMENT NOTE 11/16/2012  Patient:  [name], [name]   Account Number:  [id number]  Date Initiated:  11/13/2012  Documentation initiated by:  Sherrin Daisy  Subjective/Objective Assessment:   dx herniated disc     Action/Plan:   Cm spoke with patient and spouse. Pt is from home. Plans uncertain untilr after surgery done. Possibility of snf rehab.CM will f/u after pt/ot eval post surgery   Anticipated DC Date:  11/19/2012   Anticipated DC Plan:  SKILLED NURSING FACILITY  In-house referral  Clinical Social Worker      DC Planning Services  CM consult      Choice offered to / List presented to:             Status of service:  In process, will continue to follow Medicare Important Message given?   (If response is "NO", the following Medicare IM given date fields will be blank) Date Medicare IM given:   Date Additional Medicare IM given:    Discharge Disposition:    Per UR Regulation:  Reviewed for med. necessity/level of care/duration of stay  If discussed at [place] of Stay Meetings, dates discussed:   11/15/2012    Comments:  [id number] Rosana Hoes, RN, BSN, CCM:  CHART REVIEWED AND UPDATED.  Next chart review due on [id number]. pt had microdisctectomy-become hypotensive post op.  Did discuss hhc with the wife no known provider will give list. NO DISCHARGE NEEDS PRESENT AT THIS TIME. CASE MANAGEMENT [phone]

## 2012-11-16 NOTE — Evaluation (Signed)
Occupational Therapy Evaluation Patient Details Name: [name] MRN: [id number] DOB: 02-Feb-1932 Today's Date: 11/16/2012 Time: [id number] OT Time Calculation (min): 19 min  OT Assessment / Plan / Recommendation Clinical Impression  This 79 year old man was admitted for back pain.  He is s/p L 4-5 decompression.  Pt has had several back surgeries in the past--last one 4-5 years ago.  He will benefit from skilled OT to safely increase participation with adls and mobility to use toilet.      OT Assessment  Patient needs continued OT Services    Follow Up Recommendations  SNF    Barriers to Discharge Inaccessible home environment    Equipment Recommendations  None recommended by OT    Recommendations for Other Services    Frequency  Min 2X/week    Precautions / Restrictions Precautions Precautions: Back Restrictions Weight Bearing Restrictions: No   Pertinent Vitals/Pain 10/10 with movement.  Premedicated and pt repositioned--only tolerated on back     ADL  Transfers/Ambulation Related to ADLs: Pt was able to roll to L side.  Attempted supine to sit but pt was in too much pain:  back, legs, and R shoulder.  Pt moved on and off back, twisting:  unable to follow precautions.  Pt was premedicated, called RN for more medication ADL Comments: Pt is limited by pain:  UB total A x 1 and LB total A x 2.  Pt's UEs WFLs--had difficulty following cues to keep below 90 degrees.     OT Diagnosis: Generalized weakness  OT Problem List: Decreased strength;Decreased activity tolerance;Pain;Decreased cognition;Decreased safety awareness;Decreased knowledge of precautions;Cardiopulmonary status limiting activity OT Treatment Interventions: Self-care/ADL training;DME and/or AE instruction;Patient/family education;Therapeutic activities;Cognitive remediation/compensation   OT Goals Acute Rehab OT Goals OT Goal Formulation: Patient unable to participate in goal setting Time For Goal Achievement:  11/30/12 Potential to Achieve Goals: Good ADL Goals Pt Will Transfer to Toilet: Stand pivot transfer;3-in-1;with 2+ total assist (pt 60%) ADL Goal: Toilet Transfer - Progress: Goal set today Miscellaneous OT Goals Miscellaneous OT Goal #1: Pt will complete ub adls from supported sitting with supervision OT Goal: Miscellaneous Goal #1 - Progress: Goal set today Miscellaneous OT Goal #2: pt will complete LB adls with AE, sit to stand with A x 2 Pt 60% OT Goal: Miscellaneous Goal #2 - Progress: Goal set today Miscellaneous OT Goal #3: pt will complete bed mobility following back precautions with total A x 2 pt 60% in prep for toileting/adls OT Goal: Miscellaneous Goal #3 - Progress: Goal set today  Visit Information  Last OT Received On: 11/16/12 Assistance Needed: +2 PT/OT Co-Evaluation/Treatment: Yes    Subjective Data  Subjective: That's all I can do Patient Stated Goal: none stated.  Agreeable to OT/PT   Prior Functioning     Home Living Lives With: Spouse Available Help at Discharge: Family Type of Home: House Home Access: Stairs to enter [employer] of Steps: 13 Entrance Stairs-Rails: Right Home Layout: Two level (has only living room downstairs) Bathroom Shower/Tub: Chiropodist: [employer] Home Adaptive Equipment: Walker - rolling;Straight cane;Bedside commode/3-in-1 Communication Communication: No difficulties         Vision/Perception Vision - History Patient Visual Report:  (eyes closed a lot during session)   Cognition  Cognition Overall Cognitive Status: Impaired Area of Impairment: Following commands;Safety/judgement Arousal/Alertness:  (eyes closed--but followed some commands) Behavior During Session:  (initially lethargic then restless due to pain) Following Commands: Follows one step commands inconsistently Safety/Judgement: Decreased awareness of safety precautions;Decreased  safety judgement for tasks assessed     Extremity/Trunk Assessment Right Upper Extremity Assessment RUE ROM/Strength/Tone: Within functional levels (lifted beyond 90 (as cued).  c/o R shoulder pain with rollin) Left Upper Extremity Assessment LUE ROM/Strength/Tone: Within functional levels     Mobility Bed Mobility Bed Mobility: Rolling Right Rolling Right:  (rolled once with supervision but doesn't consistently follow)     Exercise     Balance     End of Session OT - End of Session Activity Tolerance: Patient limited by pain Patient left: in bed;with call bell/phone within reach;with nursing in room Nurse Communication: Patient requests pain meds  GO     [name] 11/16/2012, 12:53 PM Lesle Chris, OTR/L [phone] 11/16/2012

## 2012-11-17 ENCOUNTER — Inpatient Hospital Stay (HOSPITAL_COMMUNITY): Payer: Medicare Other

## 2012-11-17 ENCOUNTER — Inpatient Hospital Stay (HOSPITAL_COMMUNITY): Payer: Medicare Other | Admitting: Anesthesiology

## 2012-11-17 ENCOUNTER — Encounter (HOSPITAL_COMMUNITY): Payer: Self-pay | Admitting: Anesthesiology

## 2012-11-17 ENCOUNTER — Encounter (HOSPITAL_COMMUNITY)
Admission: EM | Disposition: A | Discharge status: Skilled Nursing Facility | Payer: Self-pay | Source: Home / Self Care | Attending: Internal Medicine

## 2012-11-17 DIAGNOSIS — K251 Acute gastric ulcer with perforation: Secondary | ICD-10-CM

## 2012-11-17 DIAGNOSIS — R400 Somnolence: Secondary | ICD-10-CM

## 2012-11-17 DIAGNOSIS — R404 Transient alteration of awareness: Secondary | ICD-10-CM

## 2012-11-17 HISTORY — PX: LAPAROSCOPY: SHX197

## 2012-11-17 LAB — BASIC METABOLIC PANEL
Chloride: 99 mEq/L (ref 96–112)
GFR calc Af Amer: 57 mL/min — ABNORMAL LOW (ref >90)
Potassium: 5 mEq/L (ref 3.5–5.1)
Sodium: 132 mEq/L — ABNORMAL LOW (ref 135–145)

## 2012-11-17 LAB — CBC
HCT: 37.1 % — ABNORMAL LOW (ref 39.0–52.0)
Platelets: 279 10*3/uL (ref 150–400)
RDW: 14.7 % (ref 11.5–15.5)
WBC: 23.7 10*3/uL — ABNORMAL HIGH (ref 4.0–10.5)

## 2012-11-17 LAB — GLUCOSE, CAPILLARY
Glucose-Capillary: 216 mg/dL — ABNORMAL HIGH (ref 70–99)
Glucose-Capillary: 180 mg/dL — ABNORMAL HIGH (ref 70–99)
Glucose-Capillary: 248 mg/dL — ABNORMAL HIGH (ref 70–99)

## 2012-11-17 SURGERY — LAPAROSCOPY, DIAGNOSTIC
Anesthesia: General | Wound class: Dirty or Infected

## 2012-11-17 MED ORDER — VANCOMYCIN HCL 10 G IV SOLR
1750.0000 mg | INTRAVENOUS | Status: DC
  Administered 2012-11-17: 1750 mg via INTRAVENOUS
  Filled 2012-11-17: qty 1750

## 2012-11-17 MED ORDER — LIDOCAINE HCL (CARDIAC) 20 MG/ML IV SOLN
INTRAVENOUS | Status: DC | PRN
  Administered 2012-11-17: 50 mg via INTRAVENOUS

## 2012-11-17 MED ORDER — LACTATED RINGERS IR SOLN
Status: DC | PRN
  Administered 2012-11-17: 3000 mL

## 2012-11-17 MED ORDER — IOHEXOL 300 MG/ML  SOLN
100.0000 mL | Freq: Once | INTRAMUSCULAR | Status: AC | PRN
  Administered 2012-11-17: 100 mL via INTRAVENOUS

## 2012-11-17 MED ORDER — TISSEEL VH 10 ML EX KIT
PACK | CUTANEOUS | Status: AC
  Filled 2012-11-17: qty 1

## 2012-11-17 MED ORDER — EPHEDRINE SULFATE 50 MG/ML IJ SOLN
INTRAMUSCULAR | Status: DC | PRN
  Administered 2012-11-17 (×2): 10 mg via INTRAVENOUS

## 2012-11-17 MED ORDER — LIDOCAINE-EPINEPHRINE 1 %-1:100000 IJ SOLN
INTRAMUSCULAR | Status: AC
  Filled 2012-11-17: qty 1

## 2012-11-17 MED ORDER — ONDANSETRON HCL 4 MG/2ML IJ SOLN
INTRAMUSCULAR | Status: DC | PRN
  Administered 2012-11-17: 4 mg via INTRAVENOUS

## 2012-11-17 MED ORDER — HYDROCODONE-ACETAMINOPHEN 10-325 MG PO TABS: 1.0000 | ORAL_TABLET | ORAL | Status: DC | PRN

## 2012-11-17 MED ORDER — SODIUM CHLORIDE 0.9 % IV SOLN
80.0000 mg | INTRAVENOUS | Status: DC
  Administered 2012-11-17: 80 mg via INTRAVENOUS
  Filled 2012-11-17: qty 80

## 2012-11-17 MED ORDER — LACTATED RINGERS IV SOLN
INTRAVENOUS | Status: DC | PRN
  Administered 2012-11-17 (×3): via INTRAVENOUS

## 2012-11-17 MED ORDER — LACTATED RINGERS IV SOLN
INTRAVENOUS | Status: DC
  Administered 2012-11-17 – 2012-11-18 (×2): via INTRAVENOUS

## 2012-11-17 MED ORDER — SODIUM CHLORIDE 0.9 % IV SOLN
8.0000 mg/h | INTRAVENOUS | Status: DC
  Administered 2012-11-17 – 2012-11-21 (×8): 8 mg/h via INTRAVENOUS
  Filled 2012-11-17 (×16): qty 80

## 2012-11-17 MED ORDER — NEOSTIGMINE METHYLSULFATE 1 MG/ML IJ SOLN
INTRAMUSCULAR | Status: DC | PRN
  Administered 2012-11-17: 4 mg via INTRAVENOUS

## 2012-11-17 MED ORDER — ROCURONIUM BROMIDE 100 MG/10ML IV SOLN
INTRAVENOUS | Status: DC | PRN
  Administered 2012-11-17: 30 mg via INTRAVENOUS
  Administered 2012-11-17 (×3): 10 mg via INTRAVENOUS

## 2012-11-17 MED ORDER — PROPOFOL 10 MG/ML IV BOLUS
INTRAVENOUS | Status: DC | PRN
  Administered 2012-11-17: 150 mg via INTRAVENOUS

## 2012-11-17 MED ORDER — PIPERACILLIN-TAZOBACTAM 3.375 G IVPB
3.3750 g | Freq: Three times a day (TID) | INTRAVENOUS | Status: DC
  Administered 2012-11-17 – 2012-11-21 (×11): 3.375 g via INTRAVENOUS
  Filled 2012-11-17 (×13): qty 50

## 2012-11-17 MED ORDER — MORPHINE SULFATE 2 MG/ML IJ SOLN
1.0000 mg | INTRAMUSCULAR | Status: DC | PRN
  Administered 2012-11-18: 2 mg via INTRAVENOUS
  Administered 2012-11-18: 4 mg via INTRAVENOUS
  Administered 2012-11-18 (×6): 2 mg via INTRAVENOUS
  Administered 2012-11-19: 4 mg via INTRAVENOUS
  Administered 2012-11-19: 2 mg via INTRAVENOUS
  Administered 2012-11-19: 4 mg via INTRAVENOUS
  Administered 2012-11-20 (×6): 2 mg via INTRAVENOUS
  Administered 2012-11-21: 4 mg via INTRAVENOUS
  Administered 2012-11-21: 2 mg via INTRAVENOUS
  Administered 2012-11-21: 4 mg via INTRAVENOUS
  Filled 2012-11-17 (×4): qty 1
  Filled 2012-11-17 (×2): qty 2
  Filled 2012-11-17: qty 1
  Filled 2012-11-17: qty 2
  Filled 2012-11-17 (×3): qty 1
  Filled 2012-11-17 (×3): qty 2
  Filled 2012-11-17: qty 1
  Filled 2012-11-17: qty 2
  Filled 2012-11-17 (×3): qty 1

## 2012-11-17 MED ORDER — SUCCINYLCHOLINE CHLORIDE 20 MG/ML IJ SOLN
INTRAMUSCULAR | Status: DC | PRN
  Administered 2012-11-17: 140 mg via INTRAVENOUS

## 2012-11-17 MED ORDER — LIDOCAINE-EPINEPHRINE 1 %-1:100000 IJ SOLN
INTRAMUSCULAR | Status: DC | PRN
  Administered 2012-11-17: 30 mL

## 2012-11-17 MED ORDER — TISSEEL VH 10 ML EX KIT
PACK | CUTANEOUS | Status: DC | PRN
  Administered 2012-11-17 (×2): 10 mL

## 2012-11-17 MED ORDER — GLYCOPYRROLATE 0.2 MG/ML IJ SOLN
INTRAMUSCULAR | Status: DC | PRN
  Administered 2012-11-17: .7 mg via INTRAVENOUS

## 2012-11-17 MED ORDER — FENTANYL CITRATE 0.05 MG/ML IJ SOLN: 25.0000 ug | INTRAMUSCULAR | Status: DC | PRN

## 2012-11-17 MED ORDER — PHENYLEPHRINE HCL 10 MG/ML IJ SOLN
INTRAMUSCULAR | Status: DC | PRN
  Administered 2012-11-17: 40 ug via INTRAVENOUS
  Administered 2012-11-17: 80 ug via INTRAVENOUS

## 2012-11-17 MED ORDER — SODIUM CHLORIDE 0.9 % IV SOLN
80.0000 mg | INTRAVENOUS | Status: DC
  Filled 2012-11-17: qty 80

## 2012-11-17 MED ORDER — PHENYLEPHRINE HCL 10 MG/ML IJ SOLN
10.0000 mg | INTRAVENOUS | Status: DC | PRN
  Administered 2012-11-17: 10 ug/min via INTRAVENOUS

## 2012-11-17 MED ORDER — BUPIVACAINE HCL (PF) 0.25 % IJ SOLN
INTRAMUSCULAR | Status: AC
  Filled 2012-11-17: qty 30

## 2012-11-17 MED ORDER — FENTANYL CITRATE 0.05 MG/ML IJ SOLN
INTRAMUSCULAR | Status: DC | PRN
  Administered 2012-11-17 (×5): 50 ug via INTRAVENOUS

## 2012-11-17 MED ORDER — LACTATED RINGERS IV SOLN: INTRAVENOUS | Status: DC

## 2012-11-17 MED ORDER — BUPIVACAINE HCL 0.25 % IJ SOLN
INTRAMUSCULAR | Status: DC | PRN
  Administered 2012-11-17: 30 mL

## 2012-11-17 MED ORDER — LACTATED RINGERS IV SOLN
INTRAVENOUS | Status: DC
  Administered 2012-11-17: 23:00:00 via INTRAVENOUS

## 2012-11-17 SURGICAL SUPPLY — 72 items
ADH SKN CLS APL DERMABOND .7 (GAUZE/BANDAGES/DRESSINGS)
APL SKNCLS STERI-STRIP NONHPOA (GAUZE/BANDAGES/DRESSINGS) ×1
APPLIER CLIP 5 13 M/L LIGAMAX5 (MISCELLANEOUS)
APPLIER CLIP ROT 10 11.4 M/L (STAPLE)
APR CLP MED LRG 11.4X10 (STAPLE)
APR CLP MED LRG 5 ANG JAW (MISCELLANEOUS)
BENZOIN TINCTURE PRP APPL 2/3 (GAUZE/BANDAGES/DRESSINGS) ×1 IMPLANT
BLADE EXTENDED COATED 6.5IN (ELECTRODE) IMPLANT
BLADE HEX COATED 2.75 (ELECTRODE) IMPLANT
BLADE SURG SZ10 CARB STEEL (BLADE) IMPLANT
CANISTER SUCTION 2500CC (MISCELLANEOUS) ×2 IMPLANT
CANNULA ENDOPATH XCEL 11M (ENDOMECHANICALS) IMPLANT
CLIP APPLIE 5 13 M/L LIGAMAX5 (MISCELLANEOUS) IMPLANT
CLIP APPLIE ROT 10 11.4 M/L (STAPLE) IMPLANT
CLOTH BEACON ORANGE TIMEOUT ST (SAFETY) ×2 IMPLANT
CLSR STERI-STRIP ANTIMIC 1/2X4 (GAUZE/BANDAGES/DRESSINGS) ×1 IMPLANT
COVER MAYO STAND STRL (DRAPES) ×2 IMPLANT
DECANTER SPIKE VIAL GLASS SM (MISCELLANEOUS) ×2 IMPLANT
DERMABOND ADVANCED (GAUZE/BANDAGES/DRESSINGS)
DERMABOND ADVANCED .7 DNX12 (GAUZE/BANDAGES/DRESSINGS) IMPLANT
DRAIN CHANNEL 19F RND (DRAIN) ×1 IMPLANT
DRAPE LAPAROSCOPIC ABDOMINAL (DRAPES) ×2 IMPLANT
DRAPE WARM FLUID 44X44 (DRAPE) IMPLANT
ELECT REM PT RETURN 9FT ADLT (ELECTROSURGICAL) ×2
ELECTRODE REM PT RTRN 9FT ADLT (ELECTROSURGICAL) ×1 IMPLANT
EVACUATOR SILICONE 100CC (DRAIN) ×1 IMPLANT
FILTER SMOKE EVAC LAPAROSHD (FILTER) IMPLANT
GLOVE BIO SURGEON STRL SZ7 (GLOVE) ×2 IMPLANT
GLOVE BIOGEL M 7.0 STRL (GLOVE) ×2 IMPLANT
GLOVE BIOGEL PI IND STRL 7.0 (GLOVE) ×1 IMPLANT
GLOVE BIOGEL PI INDICATOR 7.0 (GLOVE) ×1
GLOVE EUDERMIC 7 POWDERFREE (GLOVE) ×1 IMPLANT
GLOVE SURG SS PI 7.5 STRL IVOR (GLOVE) ×4 IMPLANT
GOWN PREVENTION PLUS LG XLONG (DISPOSABLE) ×2 IMPLANT
GOWN STRL NON-REIN LRG LVL3 (GOWN DISPOSABLE) ×2 IMPLANT
GOWN STRL REIN XL XLG (GOWN DISPOSABLE) ×4 IMPLANT
KIT BASIN OR (CUSTOM PROCEDURE TRAY) ×2 IMPLANT
LIGASURE IMPACT 36 18CM CVD LR (INSTRUMENTS) IMPLANT
NS IRRIG 1000ML POUR BTL (IV SOLUTION) ×2 IMPLANT
PENCIL BUTTON HOLSTER BLD 10FT (ELECTRODE) IMPLANT
SCALPEL HARMONIC ACE (MISCELLANEOUS) IMPLANT
SCISSORS LAP 5X45 EPIX DISP (ENDOMECHANICALS) ×1 IMPLANT
SET IRRIG TUBING LAPAROSCOPIC (IRRIGATION / IRRIGATOR) IMPLANT
SLEEVE XCEL OPT CAN 5 100 (ENDOMECHANICALS) ×1 IMPLANT
SOLUTION ANTI FOG 6CC (MISCELLANEOUS) ×2 IMPLANT
SPONGE GAUZE 4X4 12PLY (GAUZE/BANDAGES/DRESSINGS) ×2 IMPLANT
SPONGE LAP 18X18 X RAY DECT (DISPOSABLE) IMPLANT
STAPLER VISISTAT 35W (STAPLE) IMPLANT
STRIP CLOSURE SKIN 1/2X4 (GAUZE/BANDAGES/DRESSINGS) IMPLANT
SUCTION POOLE TIP (SUCTIONS) IMPLANT
SUT PDS AB 1 TP1 96 (SUTURE) IMPLANT
SUT PROLENE 2 0 KS (SUTURE) IMPLANT
SUT PROLENE 2 0 SH DA (SUTURE) IMPLANT
SUT SILK 2 0 (SUTURE)
SUT SILK 2 0 SH CR/8 (SUTURE) IMPLANT
SUT SILK 2-0 18XBRD TIE 12 (SUTURE) IMPLANT
SUT SILK 3 0 (SUTURE)
SUT SILK 3 0 SH CR/8 (SUTURE) IMPLANT
SUT SILK 3-0 18XBRD TIE 12 (SUTURE) IMPLANT
SYS LAPSCP GELPORT 120MM (MISCELLANEOUS)
SYSTEM LAPSCP GELPORT 120MM (MISCELLANEOUS) IMPLANT
TAPE CLOTH SURG 4X10 WHT LF (GAUZE/BANDAGES/DRESSINGS) ×1 IMPLANT
TOWEL OR 17X26 10 PK STRL BLUE (TOWEL DISPOSABLE) ×2 IMPLANT
TRAY FOLEY CATH 14FRSI W/METER (CATHETERS) ×2 IMPLANT
TRAY LAP CHOLE (CUSTOM PROCEDURE TRAY) ×2 IMPLANT
TROCAR BLADELESS OPT 5 75 (ENDOMECHANICALS) ×2 IMPLANT
TROCAR XCEL 12X100 BLDLESS (ENDOMECHANICALS) IMPLANT
TROCAR XCEL BLUNT TIP 100MML (ENDOMECHANICALS) IMPLANT
TROCAR XCEL NON-BLD 11X100MML (ENDOMECHANICALS) ×1 IMPLANT
TUBING INSUFFLATION 10FT LAP (TUBING) ×2 IMPLANT
YANKAUER SUCT BULB TIP 10FT TU (MISCELLANEOUS) IMPLANT
YANKAUER SUCT BULB TIP NO VENT (SUCTIONS) IMPLANT

## 2012-11-17 NOTE — Progress Notes (Addendum)
Was paged by @ ~ 1500 nurse reporting that patient has had hematemesis x 2.  Zofran administered by nursing.  May be related to stress ulcer.  Will place on protonix 80 mg IV.  Obtain CT of abdomen to further characterize and monitor for any abnormalities.  [name], [facility] PROGRESS NOTE  [name] [id number] DOB: 01-Dec-1931 DOA: 11/09/2012 PCP: No primary provider on file.  Assessment/Plan: Acute back pain MRI demonstrates protrusion of disc causing compression of the left L5 n. Root consistent with patient's pain. - [place] toook to OR on 3/20 for microdiscectomy with L4-L5 interspace with  - Pain control deferred to surgeons.  Currently patient overly sedated which will affect his participation with Physical therapy.  Discussed with ortho and plans underway to decrease pain regimen.  DM- - Continue oral antihyperglycemics - SSI decreased to mod dose and currently blood sugars well controlled on this  Obesity- contributing to his back pain.   Constipation- relieved after golytely - Continue stool softener dulcolax and colace  Hypotension  - During operation developed hypotension requiring pressors. - Will discontinue hydralazine, B blocker and lisinopril - reassess and watch in ICU throughout the course of the day - Will leave hydralazine 10 mg IV PRN for elevated Blood pressures - Once blood pressures steady within a 24 hour period will plan on transitioning out of the ICU - continue sodium chloride infusion.  Leukocytosis, most likely related to medrol dose pack. No source of infection identified and patient remains afebrile. - Most likely due to the prednisone and recent operation - Is afebrile - Will cover with vanc and zosyn at this time given that patient meets sirs criteria and has had hypotension.  Elevated creatinine - will hold nephrotoxic agents (lisinopril and NSAID) - improved with IVF's and holding nephrotoxic agents  Somnolence - At  this juncture will plan on scanning patient's head with CT scan to further evaluate - suspect this is like due to sedation with dilaudid but last dose was yesterday 3/21 ~ 6 pm per my discussion with nursing.  Code Status:full Family Communication: Wife at bedside.  Disposition Plan: Pending specialist recommendations and continued improvement in condition.   Consultants:  Ortho: Dr. Shellia Carwin  Procedures:  Refer to above  Antibiotics:  none  HPI/Subjective: Patient was transferred to icu due to lower blood pressures.  Has been very somnolent this AM.  Objective: Filed Vitals:   11/17/12 0039 11/17/12 0253 11/17/12 0331 11/17/12 0400  BP: 131/44  130/52   Pulse:    107  Temp:    98.7 F (37.1 C)  TempSrc:    Oral  Resp:    24  Height:      Weight:  136.7 kg (301 lb 5.9 oz)    SpO2:    94%    Intake/Output Summary (Last 24 hours) at 11/17/12 1005 Last data filed at 11/17/12 0600  Gross per 24 hour  Intake   3225 ml  Output    300 ml  Net   2925 ml   Filed Weights   11/14/12 1020 11/15/12 2100 11/17/12 0253  Weight: 123.514 kg (272 lb 4.8 oz) 133.1 kg (293 lb 6.9 oz) 136.7 kg (301 lb 5.9 oz)    Exam:   General:  Pt laying in bed supine, somnolent and difficult to arouse   Cardiovascular: RRR, No MRG  Respiratory: CTA BL, no wheezes  Abdomen: soft, obese, NT,   Musculoskeletal: no cyanosis or clubbing.   Data  Reviewed: Basic Metabolic Panel:  Recent Labs Lab 11/13/12 0440 11/14/12 0440 11/15/12 0440 11/16/12 0945 11/17/12 0327  NA 136 135 132* 134* 132*  K 4.3 4.4 4.9 4.7 5.0  CL 98 96 95* 98 99  CO2 30 30 28 29 26   GLUCOSE 145* 130* 168* 184* 192*  BUN 33* 37* 54* 45* 48*  CREATININE 1.13 1.29 1.56* 1.46* 1.34  CALCIUM 9.3 9.3 9.0 8.8 8.9   Liver Function Tests: No results found for this basename: AST, ALT, ALKPHOS, BILITOT, PROT, ALBUMIN,  in the last 168 hours No results found for this basename: LIPASE, AMYLASE,  in the last 168  hours No results found for this basename: AMMONIA,  in the last 168 hours CBC:  Recent Labs Lab 11/13/12 0440 11/14/12 0440 11/15/12 0440 11/16/12 0333 11/17/12 0327  WBC 13.7* 11.5* 15.2* 17.4* 23.7*  HGB 14.1 14.0 13.2 12.6* 11.8*  HCT 44.2 43.5 40.4 39.4 37.1*  MCV 89.7 90.2 89.4 90.8 90.9  PLT 346 321 286 274 279   Cardiac Enzymes: No results found for this basename: CKTOTAL, CKMB, CKMBINDEX, TROPONINI,  in the last 168 hours BNP (last 3 results) No results found for this basename: PROBNP,  in the last 8760 hours CBG:  Recent Labs Lab 11/15/12 2128 11/16/12 0746 11/16/12 1214 11/16/12 1732 11/16/12 2200  GLUCAP 137* 193* 209* 163* 131*    Recent Results (from the past 240 hour(s))  SURGICAL PCR SCREEN     Status: Abnormal   Collection Time    11/13/12  3:17 PM      Result Value Range Status   MRSA, PCR NEGATIVE  NEGATIVE Final   Staphylococcus aureus POSITIVE (*) NEGATIVE Final   Comment:            The Xpert SA Assay (FDA     approved for NASAL specimens     in patients over 54 years of age),     is one component of     a comprehensive surveillance     program.  Test performance has     been validated by [employer] for patients greater     than or equal to 51 year old.     It is not intended     to diagnose infection nor to     guide or monitor treatment.     Studies: Dg Spine Portable 1 View  11/15/2012  *RADIOLOGY REPORT*  Clinical Data: Lumbar laminectomy at L4-L5.  PORTABLE SPINE - 1 VIEW  Comparison: 11/15/2012  Findings: Surgical instrument is posterior to the L5 vertebral body.  Endplate degenerative changes in the lower lumbar spine. Alignment of the lumbar spine is stable.  IMPRESSION: Surgical marking along the posterior L5 vertebral body.   Original Report Authenticated By: Markus Daft, M.D.    Dg Spine Portable 1 View  11/15/2012  *RADIOLOGY REPORT*  Clinical Data: Intraoperative localization for spine surgery.  PORTABLE SPINE - 1 VIEW   Comparison: Earlier film, same date.  Findings: There is a surgical instrument marking the L4-5 level.  IMPRESSION: L4-5 marked intraoperatively.   Original Report Authenticated By: Marijo Sanes, M.D.    Dg Spine Portable 1 View  11/15/2012  *RADIOLOGY REPORT*  Clinical Data: Back pain.  PORTABLE SPINE - 1 VIEW  Comparison: 11/11/2012.  Findings: Five lumbar type vertebral bodies are present.  Three needles have been inserted from a posterior approach.  The  upper needle tip lies roughly 4 cm posterior to the spinous  process of L4.    The middle needle is directed roughly parallel to the L5-S1 interspace.  The lower needle is directed towards the midbody of S1.  IMPRESSION: Intraoperative spine radiograph as described.   Original Report Authenticated By: Rolla Flatten, M.D.     Scheduled Meds: . aspirin  325 mg Oral Daily  . docusate sodium  200 mg Oral BID  . gabapentin  300 mg Oral TID  . galantamine  8 mg Oral BID WC  . glyBURIDE  10 mg Oral Q breakfast  . glyBURIDE  5 mg Oral QHS  . insulin aspart  0-15 Units Subcutaneous TID WC  . insulin aspart  0-5 Units Subcutaneous QHS  . lactated ringers  500 mL Intravenous Once  . lactated ringers  500 mL Intravenous Once  . pioglitazone  30 mg Oral Daily  . polyethylene glycol  17 g Oral Daily  . polyvinyl alcohol  1 drop Both Eyes BID  . simvastatin  40 mg Oral q1800  . sodium chloride  1,000 mL Intravenous Once  . sodium chloride  3 mL Intravenous Q12H  . traZODone  100 mg Oral QHS  . venlafaxine XR  150 mg Oral QPC supper   Continuous Infusions: . sodium chloride 75 mL/hr at 11/17/12 0117  . sodium chloride    . dextrose 5% lactated ringers 100 mL/hr at 11/17/12 0117  . lactated ringers      Principal Problem:   Acute back pain Active Problems:   Diabetes   History of back surgery   Obesity   Leukocytosis, unspecified   Lumbosacral radiculopathy at L5   Herniated lumbar intervertebral disc   Hyperglycemia   Essential  hypertension, benign   Unspecified constipation   Elevated serum creatinine   Hypotension, unspecified    Time spent: > 35 minutes    Velvet Bathe  [facility] Pager [phone]. If 7PM-7AM, please contact night-coverage at [email or web address], password [email or web address] 11/17/2012, 10:05 AM  LOS: 8 days

## 2012-11-17 NOTE — Brief Op Note (Signed)
11/09/2012 - 11/17/2012  9:15 PM  PATIENT:  Doreene Eland  79 y.o. male  PRE-OPERATIVE DIAGNOSIS:  free air  POST-OPERATIVE DIAGNOSIS:  Free Air  PROCEDURE:  Procedure(s) with comments: LAPAROSCOPY DIAGNOSTIC (N/A) - Repair of gastric perforation  SURGEON:  Surgeon(s) and Role:    * [name], DO - Primary  PHYSICIAN ASSISTANT:   ASSISTANTS: [place]   ANESTHESIA:   general  EBL:  Total I/O In: 2000 [I.V.:2000] Out: 100 [Blood:100]  BLOOD ADMINISTERED:none  DRAINS: (41F) Jackson-Pratt drain(s) with closed bulb suction in the GE junction   LOCAL MEDICATIONS USED:  MARCAINE    and LIDOCAINE   SPECIMEN:  No Specimen  DISPOSITION OF SPECIMEN:  N/A  COUNTS:  YES  TOURNIQUET:  * No tourniquets in log *  DICTATION: .Other Dictation: Dictation Number [id number]  PLAN OF CARE: Admit to inpatient   PATIENT DISPOSITION:  ICU - extubated and stable.   Delay start of Pharmacological VTE agent (>24hrs) due to surgical blood loss or risk of bleeding: yes

## 2012-11-17 NOTE — Anesthesia Preprocedure Evaluation (Addendum)
Anesthesia Evaluation  Patient identified by MRN, date of birth, ID band Patient awake    Reviewed: Allergy & Precautions, H&P , NPO status , Patient's Chart, lab work & pertinent test results  Airway Mallampati: III TM Distance: >3 FB Neck ROM: full    Dental no notable dental hx. (+) Edentulous Upper and Edentulous Lower   Pulmonary neg pulmonary ROS,  breath sounds clear to auscultation  Pulmonary exam normal       Cardiovascular hypertension, Pt. on medications Rhythm:regular Rate:Normal     Neuro/Psych negative neurological ROS  negative psych ROS   GI/Hepatic negative GI ROS, Neg liver ROS,   Endo/Other  diabetes, Well Controlled, Type 2, Oral Hypoglycemic AgentsMorbid obesity  Renal/GU negative Renal ROSElevated BUN and CRT 1.46  negative genitourinary   Musculoskeletal   Abdominal (+) + obese,   Peds  Hematology negative hematology ROS (+)   Anesthesia Other Findings   Reproductive/Obstetrics negative OB ROS                          Anesthesia Physical Anesthesia Plan  ASA: III  Anesthesia Plan: General   Post-op Pain Management:    Induction: Intravenous  Airway Management Planned: Oral ETT  Additional Equipment:   Intra-op Plan:   Post-operative Plan: Extubation in OR  Informed Consent: I have reviewed the patients History and Physical, chart, labs and discussed the procedure including the risks, benefits and alternatives for the proposed anesthesia with the patient or authorized representative who has indicated his/her understanding and acceptance.   Dental Advisory Given  Plan Discussed with: CRNA and Surgeon  Anesthesia Plan Comments:         Anesthesia Quick Evaluation

## 2012-11-17 NOTE — Transfer of Care (Signed)
Immediate Anesthesia Transfer of Care Note  Patient: [name]  Procedure(s) Performed: Procedure(s) with comments: LAPAROSCOPY DIAGNOSTIC (N/A) - Repair of gastric perforation  Patient Location: PACU  Anesthesia Type:General  Level of Consciousness: sedated, patient cooperative and responds to stimulation  Airway & Oxygen Therapy: Patient Spontanous Breathing and Patient connected to face mask oxygen  Post-op Assessment: Report given to PACU RN, Post -op Vital signs reviewed and stable and Patient moving all extremities X 4  Post vital signs: Reviewed and stable  Complications: No apparent anesthesia complications

## 2012-11-17 NOTE — Progress Notes (Signed)
   Subjective: 2 Days Post-Op Procedure(s) (LRB): MICRODISCECTOMY L4 - L5 on the LEFT  1 LEVEL (Left)   Patient is resting comfortably while in the room. He appears to be quite sedated, and difficult to wake. Sat down with the wife and had a long discussion regarding the procedure and about her concerns.   Objective:   VITALS:   Filed Vitals:   11/17/12 0400  BP:   Pulse: 107  Temp: 98.7 F (37.1 C)  Resp: 24    LABS  Recent Labs  11/15/12 0440 11/16/12 0333 11/17/12 0327  HGB 13.2 12.6* 11.8*  HCT 40.4 39.4 37.1*  WBC 15.2* 17.4* 23.7*  PLT 286 274 279     Recent Labs  11/15/12 0440 11/16/12 0945 11/17/12 0327  NA 132* 134* 132*  K 4.9 4.7 5.0  BUN 54* 45* 48*  CREATININE 1.56* 1.46* 1.34  GLUCOSE 168* 184* 192*     Assessment/Plan: 2 Days Post-Op Procedure(s) (LRB): MICRODISCECTOMY L4 - L5 on the LEFT  1 LEVEL (Left)  Change pain medications, discussion had with nurse about using the least possible to control pain, but need to allow the pt to wake some. Up with therapy if able Discussed with Dr. Wendee Beavers, he would like the pt to stay in Stepdown until he has a 24 hr period of stable BP, which hasn't occurred yet.    [place] Pugh Ilayda Toda   PAC  11/17/2012, 9:49 AM

## 2012-11-17 NOTE — Patient Care Conference (Signed)
Patient vomited small amount of bright red blood x 2. Patient complaining of upper abdominal pain .Dr. Wendee Beavers notified and new orders received  will continue to monitor.

## 2012-11-17 NOTE — Progress Notes (Signed)
Physical Therapy Treatment Patient Details Name: KOHLMAN BIANCA MRN: [id number] DOB: 28-Feb-1932 Today's Date: 11/17/2012 Time: [id number] PT Time Calculation (min): 17 min  PT Assessment / Plan / Recommendation Comments on Treatment Session  Pt able to get at EOB with +2 assist, however pt VERY impulsive with all mobility and unable to follow any commands for safety.  Deferred any ambulation due to pt had been throwing up blood earlier and was to get a CT scan later.  Will check on pt tomorrow.     Follow Up Recommendations  SNF     Does the patient have the potential to tolerate intense rehabilitation     Barriers to Discharge        Equipment Recommendations  Rolling walker with 5" wheels (bari walker)    Recommendations for Other Services    Frequency Min 5X/week   Plan Discharge plan remains appropriate    Precautions / Restrictions Precautions Precautions: Back Precaution Comments: Provided max cues for back precautions, however pt unable to follow any of them.  Restrictions Weight Bearing Restrictions: No   Pertinent Vitals/Pain Unable to state pain    Mobility  Bed Mobility Bed Mobility: Rolling Right;Right Sidelying to Sit;Sit to Sidelying Right Rolling Right: 1: +2 Total assist Rolling Right: Patient Percentage: 60% Right Sidelying to Sit: 1: +2 Total assist;With rails Right Sidelying to Sit: Patient Percentage: 40% Sit to Sidelying Right: 1: +2 Total assist Sit to Sidelying Right: Patient Percentage: 60% Details for Bed Mobility Assistance: MAX cues for log rolling on right side to maintain back precautions, however pt VERY impulsive with bed mobility and required +2 for safety as pt was close to slipping off EOB.  also max cues for getting back into bed.  Transfers Transfers: Sit to Stand;Stand to Sit Sit to Stand: 1: +2 Total assist;From bed;With upper extremity assist Sit to Stand: Patient Percentage: 50% Stand to Sit: 1: +2 Total assist;To bed Stand to  Sit: Patient Percentage: 50% Details for Transfer Assistance: Pt did "mini" stands in order to scoot to [facility] once sitting at EOB.  Pt unable to perform scooting without actually standing.   Ambulation/Gait Ambulation/Gait Assistance: Not tested (comment) Ambulation/Gait Assistance Details: Deferred amb due to pt had been vomiting blood earlier and was going to CT scan soon.     Exercises     PT Diagnosis:    PT Problem List:   PT Treatment Interventions:     PT Goals Acute Rehab PT Goals PT Goal Formulation: Patient unable to participate in goal setting Time For Goal Achievement: 11/27/12 Potential to Achieve Goals: Fair Pt will Roll Supine to Right Side: with supervision PT Goal: Rolling Supine to Right Side - Progress: Progressing toward goal Pt will go Supine/Side to Sit: with supervision PT Goal: Supine/Side to Sit - Progress: Progressing toward goal Pt will Sit at Edge of Bed: with supervision PT Goal: Sit at Edge Of Bed - Progress: Progressing toward goal Pt will go Sit to Supine/Side: with supervision PT Goal: Sit to Supine/Side - Progress: Progressing toward goal Pt will go Sit to Stand: with supervision PT Goal: Sit to Stand - Progress: Progressing toward goal Pt will go Stand to Sit: with supervision PT Goal: Stand to Sit - Progress: Progressing toward goal  Visit Information  Last PT Received On: 11/17/12 Assistance Needed: +3 or more (if amb, pt very impulsive)    Subjective Data  Subjective: Pt mostly non-verbal or unable to understand.  Very "fidgety"    Cognition  Cognition Overall Cognitive Status: Impaired Area of Impairment: Following commands;Safety/judgement;Awareness of errors;Awareness of deficits;Memory Arousal/Alertness: Lethargic Behavior During Session: Restless Memory: Decreased recall of precautions Following Commands: Follows one step commands inconsistently Safety/Judgement: Decreased awareness of safety precautions;Decreased safety judgement  for tasks assessed;Impulsive Awareness of Errors: Assistance required to identify errors made Cognition - Other Comments: Pt extremely restless and impulsive during session.     Balance  Balance Balance Assessed: Yes Static Sitting Balance Static Sitting - Balance Support: Bilateral upper extremity supported;Feet supported Static Sitting - Level of Assistance: 4: Min assist;3: Mod assist;2: Max assist Static Sitting - Comment/# of Minutes: Pt required varying levels of assist to sit at EOB.  Noted pt to be very restless and kept swinging arms.    End of Session PT - End of Session Equipment Utilized During Treatment: Oxygen Activity Tolerance: Patient limited by pain;Treatment limited secondary to medical complications (Comment) Patient left: in bed;with call bell/phone within reach;with family/visitor present Nurse Communication: Mobility status (RN in room)   GP     Denice Bors 11/17/2012, 4:32 PM

## 2012-11-17 NOTE — Progress Notes (Signed)
Provided Pt and wife with bed offers.  Bernita Raisin, [place] [employer] [phone]

## 2012-11-17 NOTE — Preoperative (Signed)
Beta Blockers   Reason not to administer Beta Blockers:Not Applicable 

## 2012-11-17 NOTE — Anesthesia Postprocedure Evaluation (Signed)
  Anesthesia Post-op Note  Patient: [name]  Procedure(s) Performed: Procedure(s) (LRB): LAPAROSCOPY DIAGNOSTIC (N/A)  Patient Location: PACU  Anesthesia Type: General  Level of Consciousness: awake and alert   Airway and Oxygen Therapy: Patient Spontanous Breathing  Post-op Pain: mild  Post-op Assessment: Post-op Vital signs reviewed, Patient's Cardiovascular Status Stable, Respiratory Function Stable, Patent Airway and No signs of Nausea or vomiting  Last Vitals:  Filed Vitals:   11/17/12 2200  BP:   Pulse:   Temp: 36.7 C  Resp:     Post-op Vital Signs: stable   Complications: No apparent anesthesia complications

## 2012-11-17 NOTE — Progress Notes (Signed)
ANTIBIOTIC CONSULT NOTE - INITIAL  Pharmacy Consult for Vancomycin/Zosyn Indication: rule out sepsis  No Known Allergies  Patient Measurements: Height: 5\' 11"  (180.3 cm) Weight: 301 lb 5.9 oz (136.7 kg) IBW/kg (Calculated) : 75.3  Vital Signs: Temp: 99.1 F (37.3 C) (03/22 0800) Temp src: Oral (03/22 0800) BP: 130/52 mmHg (03/22 0331) Pulse Rate: 107 (03/22 0400) Intake/Output from previous day: 03/21 0701 - 03/22 0700 In: 3750 [I.V.:3750] Out: 500 [Urine:500] Intake/Output from this shift:    Labs:  Recent Labs  11/15/12 0440 11/16/12 0333 11/16/12 0945 11/17/12 0327  WBC 15.2* 17.4*  --  23.7*  HGB 13.2 12.6*  --  11.8*  PLT 286 274  --  279  CREATININE 1.56*  --  1.46* 1.34   Estimated Creatinine Clearance: 64.2 ml/min (by C-G formula based on Cr of 1.34). No results found for this basename: VANCOTROUGH, Corlis Leak, VANCORANDOM, GENTTROUGH, GENTPEAK, GENTRANDOM, TOBRATROUGH, TOBRAPEAK, TOBRARND, AMIKACINPEAK, AMIKACINTROU, AMIKACIN,  in the last 72 hours   Microbiology: Recent Results (from the past 720 hour(s))  SURGICAL PCR SCREEN     Status: Abnormal   Collection Time    11/13/12  3:17 PM      Result Value Range Status   MRSA, PCR NEGATIVE  NEGATIVE Final   Staphylococcus aureus POSITIVE (*) NEGATIVE Final   Comment:            The Xpert SA Assay (FDA     approved for NASAL specimens     in patients over 77 years of age),     is one component of     a comprehensive surveillance     program.  Test performance has     been validated by [employer] for patients greater     than or equal to 77 year old.     It is not intended     to diagnose infection nor to     guide or monitor treatment.    Medical History: Past Medical History  Diagnosis Date  . Diabetes mellitus without complication   . Post traumatic stress disorder (PTSD)     [place] War    Assessment: 79 YOM s/p microdiskectomy 3/20 developed leukocytosis most likely 2/2 prednisone;  however, given patient's recent hypotension will empirically cover vancomycin and zosyn for r/o sepsis.  Antibiotic dosing will be adjusted for obese patient with moderate renal insufficiency.  CrCl~46 ml/min (normalized), ~64 ml/min (CG).  Goal of Therapy:  Vancomycin trough level 15-20 mcg/ml  Plan:  Vancomycin 1750 mg IV q24h. Zosyn 3.375g IV q8h (4 hour infusion time).  Hershal Coria 11/17/2012,11:04 AM
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
## 2012-11-18 DIAGNOSIS — M48061 Spinal stenosis, lumbar region without neurogenic claudication: Secondary | ICD-10-CM

## 2012-11-18 DIAGNOSIS — K255 Chronic or unspecified gastric ulcer with perforation: Secondary | ICD-10-CM

## 2012-11-18 LAB — BASIC METABOLIC PANEL
BUN: 46 mg/dL — ABNORMAL HIGH (ref 6–23)
CO2: 26 mEq/L (ref 19–32)
Calcium: 8.9 mg/dL (ref 8.4–10.5)
Chloride: 103 mEq/L (ref 96–112)
Creatinine, Ser: 1.05 mg/dL (ref 0.50–1.35)
Glucose, Bld: 185 mg/dL — ABNORMAL HIGH (ref 70–99)

## 2012-11-18 LAB — GLUCOSE, CAPILLARY
Glucose-Capillary: 166 mg/dL — ABNORMAL HIGH (ref 70–99)
Glucose-Capillary: 190 mg/dL — ABNORMAL HIGH (ref 70–99)
Glucose-Capillary: 208 mg/dL — ABNORMAL HIGH (ref 70–99)
Glucose-Capillary: 87 mg/dL (ref 70–99)
Glucose-Capillary: 88 mg/dL (ref 70–99)

## 2012-11-18 LAB — CBC
MCHC: 33.1 g/dL (ref 30.0–36.0)
RDW: 14.8 % (ref 11.5–15.5)

## 2012-11-18 MED ORDER — LABETALOL HCL 100 MG PO TABS
100.0000 mg | ORAL_TABLET | Freq: Three times a day (TID) | ORAL | Status: DC
  Filled 2012-11-18: qty 1

## 2012-11-18 MED ORDER — DEXTROSE-NACL 5-0.9 % IV SOLN
INTRAVENOUS | Status: DC
  Administered 2012-11-18: 100 mL via INTRAVENOUS
  Administered 2012-11-18: 100 mL/h via INTRAVENOUS

## 2012-11-18 MED ORDER — BIOTENE DRY MOUTH MT LIQD
15.0000 mL | Freq: Two times a day (BID) | OROMUCOSAL | Status: DC
  Administered 2012-11-18 – 2012-11-21 (×7): 15 mL via OROMUCOSAL

## 2012-11-18 MED ORDER — CHLORHEXIDINE GLUCONATE 0.12 % MT SOLN
15.0000 mL | Freq: Two times a day (BID) | OROMUCOSAL | Status: DC
  Administered 2012-11-18 – 2012-11-21 (×5): 15 mL via OROMUCOSAL
  Filled 2012-11-18 (×5): qty 15

## 2012-11-18 MED ORDER — LABETALOL HCL 5 MG/ML IV SOLN
10.0000 mg | Freq: Once | INTRAVENOUS | Status: AC
  Administered 2012-11-18: 10 mg via INTRAVENOUS
  Filled 2012-11-18: qty 4

## 2012-11-18 NOTE — Op Note (Signed)
NAMECRISTEN, [name]               ACCOUNT NO.:  [id number]  MEDICAL RECORD NO.:  [id number]  LOCATION:  [id number]                         FACILITY:  [facility]  PHYSICIAN:  [name], MD       DATE OF BIRTH:  [birth year]  DATE OF PROCEDURE:  11/17/2012 DATE OF DISCHARGE:                              OPERATIVE REPORT   PREOPERATIVE DIAGNOSIS:  Perforated ulcer.  POSTOPERATIVE DIAGNOSIS:  Perforated ulcer.  PROCEDURE:  Diagnostic laparoscopy with laparoscopic abdominal washout and laparoscopic repair of gastric perforation and drain placement and EGD. Same.  SURGEON:  [name], MD  ASSISTANT:  [name]. [name], M.D.  ANESTHESIA:  General endotracheal anesthesia.  FLUIDS:  2.5 L crystalloid.  ESTIMATED BLOOD LOSS:  100 mL.  DRAINS:  A 19-French Blake drain placed over the perforation near the GE junction.  SPECIMENS:  None.  COMPLICATIONS:  None.  FINDINGS:  Proximal anterior gastric perforation with primary repair and Tisseel placement and drain placement.  EGD performed, which did not show any other obvious inflammatory change in the stomach, and leak test was negative, although the visualization of the mucosa was limited due to the amount of intraluminal blood.  INDICATION FOR PROCEDURE:  [name] is a 79 year old male with acute onset of abdominal pain and bloating today while he was in the hospital 2 days after spinal diskectomy.  CT scan with free air and upper abdominal tenderness, and hematemesis.  OPERATIVE DETAILS:  [name] was seen and evaluated in the intensive care unit.  Risks and benefits of procedure were discussed with the patient and his son who is power-of-attorney and his wife were present. Informed consent was obtained.  He was taken to the operating room, placed on table in supine position.  General endotracheal anesthesia was obtained and Foley catheter was placed.  His abdomen was prepped and draped in standard surgical fashion.  I made an  incision through the left rectus sheath and with Hasson technique entered the abdomen and a 12-mm balloon port was placed.  Pneumoperitoneum was obtained.  The laparoscope was introduced and he had some murky greenish-yellow fluid throughout his abdomen, which was mainly located in the upper abdomen. He had some small amount of white fibrinous exudate around the gallbladder as well, but most of this was concentrated up under the left lobe of the liver.  I placed a 5-mm left lateral trocar and a 10 mm left subcostal trocar, a right rectus 5 mm port was placed as well and Nathanson liver retractor was placed through the epigastrium and the left lobe of the liver was retracted.  The upper part of the stomach was adhered to the diaphragm and some omentum, which was in that area with the increasing amount of the fibrinous exudate and I took this down using blunt dissection.  This revealed a small probably 4-5 mm anterior gastric perforation up near the cardia of the stomach with leakage of dark gastric fluid and evidence of old blood.  After I explored the area and did not find any other areas of perforation, I laparoscopically closed the perforation with 3 interrupted 2-0 silk stitches and this stopped the leakage of the gastric fluid.  While Dr. Dalbert [name] filled the abdomen with water, I performed EGD and a well-lubricated fiberoptic endoscope was passed through the esophagus without difficulty into the stomach.  He had evidence of dark fluid, but this was some old blood. It was not a large amount of thick clot, but there was some clot in the area of the perforation and I washed out the stomach as much as I could, but this was limited as well.  I passed the scope in the duodenum, which had some of this dark fluid as well, but it appeared soft and there was no blood.  I did not see any evidence of ulcer, but again the visualization of the duodenum was fairly limited endoscopically.  I did not  see any ulcers in the pre-pyloric region or in the rest of the body of the stomach except for the area of concern.  There was no obvious tumor, but again visualization was fairly limited and the plan would be for repeat endoscopy in 4-8 weeks after the patient resolves from this acute episode to document healing of the ulcer and to rule out any malignancy.  The air was suctioned from the stomach and the scope was removed.  Then, I scrubbed back in and washed out the abdomen with several liters of sterile saline solution.  I then applied Tisseel fibrin glue over the perforation and glued it nearby tongue of omentum, which had already initially been covering the perforation, sealed this back over the sutures, and placed a 19-French Blake drain in the area as well and exited this through one of our 5 mm trocar sites in the left upper quadrant.  The drain was sutured in place with a nylon drain stitch and Nathanson liver retractor was removed under direct visualization.  The 12 mm camera port was closed with 0 Vicryl and figure-of-eight sutures in open fashion and the abdomen was re- insufflated with carbon dioxide gas and the abdominal closure was noted to be adequate.  The 11 mm left upper quadrant trocar site ended up over the rib and I did not close this and final trocars were removed after visualizing that our abdominal wall closure was adequate without any evidence of bowel injury upon closure.  The wounds were injected with 20 mL of 1% lidocaine with epinephrine and 0.25% Marcaine in a 50:50 mixture and the skin edges were approximated with 4-0 Monocryl subcuticular suture.  Skin was washed and dried and benzoin and Steri- Strips were applied.  All sponge, needle, and instrument counts were correct at the end of the case.  The patient tolerated the procedure well without apparent complications.          ______________________________ [name], MD     BL/MEDQ  D:  11/17/2012   T:  11/18/2012  Job:  [id number]

## 2012-11-18 NOTE — Progress Notes (Signed)
[facility] PROGRESS NOTE  GERGORY REALI [id number] DOB: 1932-06-27 DOA: 11/09/2012 PCP: No primary provider on file.  Assessment/Plan: Acute back pain MRI on 3/16 demonstrated protrusion of disc causing compression of the left L5 n. Root consistent with patient's pain. - [place] to OR on 3/20 for microdiscectomy with L4-L5 interspace with  - Pain control per surgery, following adjustment of medication regimen pt more alert today.   -PT to follow Perforated gastric Ulcer -s/p laparoscopic abdominal washout and laparoscopic repair of gastric perforation and drain placement and EGD per surgery on 3/22  DM- - pt NPO secondary to above and with BG down to 70 today -change IVF to D5NS, follow - will continue SSI, continue to hold off oral hypoglycemic agents and follow   Obesity- contributing to his back pain.   Constipation- relieved after golytely - Continue stool softener dulcolax and colace  Hypotension  - During operation on 3/21 developed hypotension requiring pressors. -  hydralazine, B blocker and lisinopril dc'ed on 3/22 - continue sodium chloride infusion. -resolved, BP stable so far today  Leukocytosis,  - Most likely secondary to perforated gastric ulcer, and may be prednisone and recent 3/21 surgery were factors as well - continue zosyn -wbc beginning to trend down, follow and recheck ARF -resolved -continue holding nephrotoxic agents (lisinopril and NSAID)  -on IVF  Somnolence - resolved was likely secondary to narcotics and infection Code Status:full Family Communication: Wife at bedside.  Disposition Plan: Pending ortho and surgery recommendations   Consultants:  Ortho: Dr. Shellia Carwin  Procedures:  Refer to above  Antibiotics:  none  HPI/Subjective: states he feels much better today, denies nausea, denies abdominal pain, reports his back pain is controlled Objective: Filed Vitals:   11/18/12 0600 11/18/12 0700 11/18/12 0800  11/18/12 0900  BP: 149/63 128/62 123/56 135/62  Pulse: 98 99 95 94  Temp:   99.1 F (37.3 C)   TempSrc:   Axillary   Resp: 20 20 16 16   Height:      Weight:      SpO2: 99% 99% 100% 100%    Intake/Output Summary (Last 24 hours) at 11/18/12 0916 Last data filed at 11/18/12 0900  Gross per 24 hour  Intake 5204.9 ml  Output   2045 ml  Net 3159.9 ml   Filed Weights   11/15/12 2100 11/17/12 0253 11/18/12 0000  Weight: 133.1 kg (293 lb 6.9 oz) 136.7 kg (301 lb 5.9 oz) 140.6 kg (309 lb 15.5 oz)    Exam:   General:  Pt is alert and orientedx3 with NG in place  Cardiovascular: RRR, No MRG  Respiratory: CTA BL, no wheezes  Abdomen: soft, obese, decreased BS, incisional wounds with -dressing clean and dry   Extremities: no cyanosis or clubbing.   Data Reviewed: Basic Metabolic Panel:  Recent Labs Lab 11/14/12 0440 11/15/12 0440 11/16/12 0945 11/17/12 0327 11/18/12 0349  NA 135 132* 134* 132* 135  K 4.4 4.9 4.7 5.0 4.5  CL 96 95* 98 99 103  CO2 30 28 29 26 26   GLUCOSE 130* 168* 184* 192* 185*  BUN 37* 54* 45* 48* 46*  CREATININE 1.29 1.56* 1.46* 1.34 1.05  CALCIUM 9.3 9.0 8.8 8.9 8.9   Liver Function Tests: No results found for this basename: AST, ALT, ALKPHOS, BILITOT, PROT, ALBUMIN,  in the last 168 hours No results found for this basename: LIPASE, AMYLASE,  in the last 168 hours No results found for this basename: AMMONIA,  in the last  168 hours CBC:  Recent Labs Lab 11/14/12 0440 11/15/12 0440 11/16/12 0333 11/17/12 0327 11/18/12 0349  WBC 11.5* 15.2* 17.4* 23.7* 17.9*  HGB 14.0 13.2 12.6* 11.8* 10.0*  HCT 43.5 40.4 39.4 37.1* 30.2*  MCV 90.2 89.4 90.8 90.9 89.9  PLT 321 286 274 279 269   Cardiac Enzymes: No results found for this basename: CKTOTAL, CKMB, CKMBINDEX, TROPONINI,  in the last 168 hours BNP (last 3 results) No results found for this basename: PROBNP,  in the last 8760 hours CBG:  Recent Labs Lab 11/17/12 1929 11/17/12 2151  11/18/12 0023 11/18/12 0339 11/18/12 0738  GLUCAP 190* 248* 208* 166* 88    Recent Results (from the past 240 hour(s))  SURGICAL PCR SCREEN     Status: Abnormal   Collection Time    11/13/12  3:17 PM      Result Value Range Status   MRSA, PCR NEGATIVE  NEGATIVE Final   Staphylococcus aureus POSITIVE (*) NEGATIVE Final   Comment:            The Xpert SA Assay (FDA     approved for NASAL specimens     in patients over 65 years of age),     is one component of     a comprehensive surveillance     program.  Test performance has     been validated by [employer] for patients greater     than or equal to 37 year old.     It is not intended     to diagnose infection nor to     guide or monitor treatment.     Studies: Dg Abd 1 View  11/17/2012  *RADIOLOGY REPORT*  Clinical Data: Abdominal pain post lumbar surgery.  ABDOMEN - 1 VIEW  Comparison: 11/15/2012 and earlier studies  Findings: There is gaseous distention of the stomach.  Small bowel and colon decompressed.  Skin staples project over the lower lumbar spine.  IMPRESSION:  Gaseous dilatation of the stomach.   Original Report Authenticated By: D. Wallace Going, MD    Ct Abdomen Pelvis W Contrast  11/17/2012  *RADIOLOGY REPORT*  Clinical Data: 79 year old male with hematemesis, abdominal pelvic pain and altered mental status. Recent lumbar surgery.  CT ABDOMEN AND PELVIS WITH CONTRAST  Technique:  Multidetector CT imaging of the abdomen and pelvis was performed following the standard protocol during bolus administration of intravenous contrast.  Contrast: 186mL OMNIPAQUE IOHEXOL 300 MG/ML  SOLN  Comparison: 11/17/2012 and prior radiographs.  Findings: A large amount of pneumoperitoneum is identified compatible with bowel perforation.  The source of this free air is difficult to determine but most of the free air is located within the upper abdomen. Gastric distention is noted. No other bowel abnormalities are identified.  The appendix  is normal.  Moderate bibasilar atelectasis noted. A tiny amount of ascites is present.  The liver, gallbladder, spleen, adrenal glands, pancreas and kidneys are unremarkable except for bilateral renal atrophy and a probable left renal cyst.  There is no evidence of enlarged lymph nodes, biliary dilatation or abdominal aortic aneurysm.  The bladder is unremarkable.  Postoperative changes and gas posterior to the lower lumbar spine is not unexpected given recent surgery. Degenerative disc disease and spondylosis within the lumbar spine identified.  No acute or suspicious bony abnormalities are identified.  IMPRESSION: Large amount of pneumoperitoneum compatible with bowel perforation. The source of this free air is not identified but this free air is located  primarily in the upper abdomen suggesting possible gastric perforation.  Postoperative changes and gas along the lower lumbar spine - not unexpected given recent surgery.  Moderate bibasilar atelectasis.  Tiny amount of ascites.  Critical Value/emergent results were called by telephone at the time of interpretation on 11/17/2012 at 5:25 p.m. to Dr. Wendee Beavers, who verbally acknowledged these results.   Original Report Authenticated By: Margarette Canada, M.D.     Scheduled Meds: . antiseptic oral rinse  15 mL Mouth Rinse q12n4p  . chlorhexidine  15 mL Mouth Rinse BID  . insulin aspart  0-15 Units Subcutaneous TID WC  . insulin aspart  0-5 Units Subcutaneous QHS  . piperacillin-tazobactam (ZOSYN)  IV  3.375 g Intravenous Q8H  . polyvinyl alcohol  1 drop Both Eyes BID   Continuous Infusions: . lactated ringers 125 mL/hr at 11/18/12 0600  . pantoprozole (PROTONIX) infusion 8 mg/hr (11/17/12 2349)    Principal Problem:   Acute back pain Active Problems:   Diabetes   History of back surgery   Obesity   Leukocytosis, unspecified   Lumbosacral radiculopathy at L5   Herniated lumbar intervertebral disc   Hyperglycemia   Essential hypertension, benign    Unspecified constipation   Elevated serum creatinine   Hypotension, unspecified   Somnolence    Time spent: > 35 minutes    [facility] Pager [phone]. If 7PM-7AM, please contact night-coverage at [email or web address], password [facility] 11/18/2012, 9:16 AM  LOS: 9 days

## 2012-11-18 NOTE — Progress Notes (Signed)
[name]  MRN: [id number] DOB/Age: Jul 29, 1932 79 y.o. Physician: Rada Hay Procedure: Procedure(s) (LRB): LAPAROSCOPY DIAGNOSTIC (N/A)     Subjective: No c/o back pain. PO from exploratory lap yesterday with NG tube in place  Vital Signs Temp:  [97.7 F (36.5 C)-99.1 F (37.3 C)] 99.1 F (37.3 C) (03/23 0800) Pulse Rate:  [25-114] 94 (03/23 0900) Resp:  [16-29] 16 (03/23 0900) BP: (100-167)/(50-93) 135/62 mmHg (03/23 0900) SpO2:  [64 %-100 %] 100 % (03/23 0900) Weight:  [140.6 kg (309 lb 15.5 oz)] 140.6 kg (309 lb 15.5 oz) (03/23 0000)  Lab Results  Recent Labs  11/17/12 0327 11/18/12 0349  WBC 23.7* 17.9*  HGB 11.8* 10.0*  HCT 37.1* 30.2*  PLT 279 269   BMET  Recent Labs  11/17/12 0327 11/18/12 0349  NA 132* 135  K 5.0 4.5  CL 99 103  CO2 26 26  GLUCOSE 192* 185*  BUN 48* 46*  CREATININE 1.34 1.05  CALCIUM 8.9 8.9   INR  Date Value Range Status  11/12/2012 1.07  0.00 - 1.49 Final     Exam Moves feet well and LE        Plan Ok from our standpoint to be OOB as tol. Continue other care per Surgery. Appreciate their assistance with [name], [name] for Dr.Kevin Supple 11/18/2012, 9:55 AM

## 2012-11-18 NOTE — Progress Notes (Signed)
Patient ID: [name], male   DOB: Apr 26, 1932, 79 y.o.   MRN: [id number] 1 Day Post-Op  Subjective: Feels  "fine", much better than before surgery.  Minimal abd pain  Objective: Vital signs in last 24 hours: Temp:  [97.7 F (36.5 C)-99.1 F (37.3 C)] 99 F (37.2 C) (03/23 0400) Pulse Rate:  [25-114] 98 (03/23 0600) Resp:  [16-29] 20 (03/23 0600) BP: (100-167)/(50-93) 149/63 mmHg (03/23 0600) SpO2:  [64 %-100 %] 99 % (03/23 0600) Weight:  [309 lb 15.5 oz (140.6 kg)] 309 lb 15.5 oz (140.6 kg) (03/23 0000) Last BM Date: 11/17/12  Intake/Output from previous day: 03/22 0701 - 03/23 0700 In: 5024.9 [I.V.:4425; IV Piggyback:599.9] Out: 1895 [Urine:1400; Emesis/NG output:353; Drains:42; Blood:100] Intake/Output this shift:    General appearance: alert, cooperative and moderately obese Resp: clear to auscultation bilaterally GI: normal findings: soft, non-tender and JP with serous drainage Incision/Wound: Dressings clean and dry  Lab Results:   Recent Labs  11/17/12 0327 11/18/12 0349  WBC 23.7* 17.9*  HGB 11.8* 10.0*  HCT 37.1* 30.2*  PLT 279 269   BMET  Recent Labs  11/17/12 0327 11/18/12 0349  NA 132* 135  K 5.0 4.5  CL 99 103  CO2 26 26  GLUCOSE 192* 185*  BUN 48* 46*  CREATININE 1.34 1.05  CALCIUM 8.9 8.9     Studies/Results: Dg Abd 1 View  11/17/2012  *RADIOLOGY REPORT*  Clinical Data: Abdominal pain post lumbar surgery.  ABDOMEN - 1 VIEW  Comparison: 11/15/2012 and earlier studies  Findings: There is gaseous distention of the stomach.  Small bowel and colon decompressed.  Skin staples project over the lower lumbar spine.  IMPRESSION:  Gaseous dilatation of the stomach.   Original Report Authenticated By: D. Wallace Going, MD    Ct Abdomen Pelvis W Contrast  11/17/2012  *RADIOLOGY REPORT*  Clinical Data: 79 year old male with hematemesis, abdominal pelvic pain and altered mental status. Recent lumbar surgery.  CT ABDOMEN AND PELVIS WITH CONTRAST   Technique:  Multidetector CT imaging of the abdomen and pelvis was performed following the standard protocol during bolus administration of intravenous contrast.  Contrast: 155mL OMNIPAQUE IOHEXOL 300 MG/ML  SOLN  Comparison: 11/17/2012 and prior radiographs.  Findings: A large amount of pneumoperitoneum is identified compatible with bowel perforation.  The source of this free air is difficult to determine but most of the free air is located within the upper abdomen. Gastric distention is noted. No other bowel abnormalities are identified.  The appendix is normal.  Moderate bibasilar atelectasis noted. A tiny amount of ascites is present.  The liver, gallbladder, spleen, adrenal glands, pancreas and kidneys are unremarkable except for bilateral renal atrophy and a probable left renal cyst.  There is no evidence of enlarged lymph nodes, biliary dilatation or abdominal aortic aneurysm.  The bladder is unremarkable.  Postoperative changes and gas posterior to the lower lumbar spine is not unexpected given recent surgery. Degenerative disc disease and spondylosis within the lumbar spine identified.  No acute or suspicious bony abnormalities are identified.  IMPRESSION: Large amount of pneumoperitoneum compatible with bowel perforation. The source of this free air is not identified but this free air is located primarily in the upper abdomen suggesting possible gastric perforation.  Postoperative changes and gas along the lower lumbar spine - not unexpected given recent surgery.  Moderate bibasilar atelectasis.  Tiny amount of ascites.  Critical Value/emergent results were called by telephone at the time of interpretation on 11/17/2012 at 5:25 p.m.  to Dr. Wendee Beavers, who verbally acknowledged these results.   Original Report Authenticated By: Margarette Canada, M.D.     Anti-infectives: Anti-infectives   Start     Dose/Rate Route Frequency Ordered Stop   11/17/12 1200  piperacillin-tazobactam (ZOSYN) IVPB 3.375 g     3.375  g 12.5 mL/hr over 240 Minutes Intravenous Every 8 hours 11/17/12 1025     11/17/12 1200  vancomycin (VANCOCIN) 1,750 mg in sodium chloride 0.9 % 500 mL IVPB  Status:  Discontinued     1,750 mg 250 mL/hr over 120 Minutes Intravenous Every 24 hours 11/17/12 1027 11/17/12 2327   11/16/12 0000  ceFAZolin (ANCEF) IVPB 2 g/50 mL premix     2 g 100 mL/hr over 30 Minutes Intravenous Every 6 hours 11/15/12 2116 11/16/12 0611   11/15/12 1545  ceFAZolin (ANCEF) 3 g in dextrose 5 % 50 mL IVPB     3 g 160 mL/hr over 30 Minutes Intravenous  Once 11/15/12 1542 11/15/12 1612      Assessment/Plan: s/p Procedure(s): LAPAROSCOPIC CLOSURE PERFORATED GASTRIC ULCER Doing well today Continue NG, antibiotics OOB as allowed by ortho    LOS: 9 days    Kyrstyn Greear T 11/18/2012

## 2012-11-18 NOTE — Progress Notes (Signed)
Dr. Lilyan Punt informed of Nasogastric tube putting out bloody drainage with blood clots. Irrigated to almost clear with 63ml NS for irrigation. Protonix infusion started. Will continue to closely observe.

## 2012-11-18 NOTE — Progress Notes (Addendum)
Physical Therapy Treatment Patient Details Name: [name] MRN: [id number] DOB: October 29, 1931 Today's Date: 11/18/2012 Time: [id number] PT Time Calculation (min): 37 min  PT Assessment / Plan / Recommendation Comments on Treatment Session  Note that pt is s/p ex lap yesterday and now with JP drain and NG tube placement.  Pt able to ambulate in hallway today with +2 assist for safety and line management.  Tolerated well, however did c/o fatigue quickly. Ambulated on 4L O2 with SaO2 in 90's throughout session.     Follow Up Recommendations  SNF     Does the patient have the potential to tolerate intense rehabilitation     Barriers to Discharge        Equipment Recommendations  Rolling walker with 5" wheels    Recommendations for Other Services    Frequency Min 5X/week   Plan Discharge plan remains appropriate    Precautions / Restrictions Precautions Precautions: Back Precaution Comments: Provided handout with max cues for pt to follow during mobility.  Also educated wife/family on back precautions.  Restrictions Weight Bearing Restrictions: No   Pertinent Vitals/Pain 4/10 pain in abdomen    Mobility  Bed Mobility Bed Mobility: Rolling Right;Right Sidelying to Sit;Sit to Sidelying Right Rolling Right: 4: Min assist Right Sidelying to Sit: 1: +2 Total assist;With rails Right Sidelying to Sit: Patient Percentage: 50% Details for Bed Mobility Assistance: Continue to provide cues for log rolling technique to get to EOB.  Requires assist for LEs off of bed and for trunk to attain sitting position.  Transfers Transfers: Sit to Stand;Stand to Sit Sit to Stand: 1: +2 Total assist;From bed;With upper extremity assist Sit to Stand: Patient Percentage: 60% Stand to Sit: 1: +2 Total assist;To bed Stand to Sit: Patient Percentage: 60% Details for Transfer Assistance: Assist to rise, steady and ensure controlled descent with cues for hand placement and safety.    Ambulation/Gait Ambulation/Gait Assistance: 1: +2 Total assist Ambulation/Gait: Patient Percentage: 60% Ambulation Distance (Feet): 35 Feet Assistive device: Rolling walker (bari walker) Ambulation/Gait Assistance Details: Noted pts LEs to be somewhat "wobbly" initially, however improved as he continued ambulating.  cues for maintaining position inside of RW and for upright posture.   Gait Pattern: Step-through pattern;Decreased stride length;Antalgic;Decreased weight shift to left Gait velocity: decreased    Exercises     PT Diagnosis:    PT Problem List:   PT Treatment Interventions:     PT Goals Acute Rehab PT Goals PT Goal Formulation: Patient unable to participate in goal setting Time For Goal Achievement: 11/27/12 Potential to Achieve Goals: Good Pt will Roll Supine to Right Side: with supervision PT Goal: Rolling Supine to Right Side - Progress: Progressing toward goal Pt will go Supine/Side to Sit: with supervision PT Goal: Supine/Side to Sit - Progress: Progressing toward goal Pt will Sit at Edge of Bed: with supervision PT Goal: Sit at Edge Of Bed - Progress: Met Pt will go Sit to Stand: with supervision PT Goal: Sit to Stand - Progress: Progressing toward goal Pt will go Stand to Sit: with supervision PT Goal: Stand to Sit - Progress: Progressing toward goal Pt will Ambulate: 16 - 50 feet;with supervision;with rolling walker PT Goal: Ambulate - Progress: Progressing toward goal  Visit Information  Last PT Received On: 11/18/12 Assistance Needed: +2    Subjective Data  Subjective: I feel okay (while ambulating)   Cognition  Cognition Overall Cognitive Status: Appears within functional limits for tasks assessed/performed Arousal/Alertness: Lethargic Orientation Level: Appears intact  for tasks assessed Behavior During Session: [facility] for tasks performed Cognition - Other Comments: Pt much more cognitively intact today vs yesterday.  also was not as restless.      Balance     End of Session PT - End of Session Equipment Utilized During Treatment: Oxygen Activity Tolerance: Patient limited by pain Patient left: in chair;with call bell/phone within reach;with nursing in room;with family/visitor present Nurse Communication: Mobility status   GP     Denice Bors 11/18/2012, 12:33 PM

## 2012-11-19 ENCOUNTER — Encounter (HOSPITAL_COMMUNITY): Payer: Self-pay | Admitting: General Surgery

## 2012-11-19 LAB — BASIC METABOLIC PANEL
BUN: 29 mg/dL — ABNORMAL HIGH (ref 6–23)
CO2: 29 mEq/L (ref 19–32)
Chloride: 106 mEq/L (ref 96–112)
GFR calc non Af Amer: 76 mL/min — ABNORMAL LOW (ref >90)
Glucose, Bld: 107 mg/dL — ABNORMAL HIGH (ref 70–99)
Potassium: 4.5 mEq/L (ref 3.5–5.1)

## 2012-11-19 LAB — GLUCOSE, CAPILLARY: Glucose-Capillary: 100 mg/dL — ABNORMAL HIGH (ref 70–99)

## 2012-11-19 LAB — CBC
HCT: 28.4 % — ABNORMAL LOW (ref 39.0–52.0)
Hemoglobin: 9.3 g/dL — ABNORMAL LOW (ref 13.0–17.0)
MCHC: 32.7 g/dL (ref 30.0–36.0)
RBC: 3.14 MIL/uL — ABNORMAL LOW (ref 4.22–5.81)

## 2012-11-19 MED ORDER — DEXTROSE-NACL 5-0.9 % IV SOLN
INTRAVENOUS | Status: DC
  Administered 2012-11-19 – 2012-11-20 (×3): via INTRAVENOUS
  Administered 2012-11-21: 1000 mL via INTRAVENOUS

## 2012-11-19 MED ORDER — LORAZEPAM 2 MG/ML IJ SOLN
0.5000 mg | Freq: Once | INTRAMUSCULAR | Status: AC
  Administered 2012-11-19: 0.5 mg via INTRAVENOUS
  Filled 2012-11-19: qty 1

## 2012-11-19 NOTE — Progress Notes (Signed)
Awaiting bowel function Continue NG tube for now - may have some ice chips  Imogene Burn. Georgette Dover, MD, [facility]  11/19/2012 8:26 AM

## 2012-11-19 NOTE — Progress Notes (Addendum)
[facility] PROGRESS NOTE  [name] [id number] DOB: [birth year] DOA: 11/09/2012 PCP: No primary provider on file.  Assessment/Plan: Acute back pain MRI on 3/16 demonstrated protrusion of disc causing compression of the left L5 n. Root consistent with patient's pain. - [place] to OR on 3/20 for microdiscectomy with L4-L5 interspace with  - Pain control per surgery, following adjustment of medication regimen pt more alert today.   -PT to follow Perforated gastric Ulcer -s/p laparoscopic abdominal washout and laparoscopic repair of gastric perforation and drain placement and EGD per surgery on 3/22  DM- - pt NPO secondary to above and with BG down to 70 3/23 -continue IVF to D5NS, follow - will continue SSI, continue to hold off oral hypoglycemic agents and follow -blood glucose stable so far today 3/24  Obesity- contributing to his back pain.   Constipation- relieved after golytely - Continue stool softener dulcolax and colace  Hypotension  - During operation on 3/21 developed hypotension requiring pressors. - hydralazine, B blocker and lisinopril dc'ed on 3/22 - continue sodium chloride infusion. -Episode of hypotension this a.m., but now improved, continue holding antihypertensives and monitor  Leukocytosis, - Most likely secondary to perforated gastric ulcer, and may be prednisone and recent 3/21 surgery were factors as well - continue zosyn -wbc continuing to trend down, follow and recheck ARF -resolved -continue holding nephrotoxic agents (lisinopril and NSAID)  -on IVF  Somnolence - resolved was likely secondary to narcotics and infection Code Status:full Family Communication: Wife at bedside.  Disposition Plan: transfer to step down status  Consultants:  Ortho: Dr. Shellia Carwin  Procedures:  Refer to above  Antibiotics:  none  HPI/Subjective: complaining of back and abdominal pain today-states not feeling as well today as he did  yesterday. Objective: Filed Vitals:   11/19/12 0400 11/19/12 0500 11/19/12 0700 11/19/12 0800  BP: 119/82 144/59 123/48 114/45  Pulse: 93 99 90 88  Temp: 98.5 F (36.9 C)   98.2 F (36.8 C)  TempSrc: Oral   Axillary  Resp:   19 14  Height:      Weight:  140.2 kg (309 lb 1.4 oz)    SpO2: 99% 98% 100% 99%    Intake/Output Summary (Last 24 hours) at 11/19/12 0854 Last data filed at 11/19/12 0600  Gross per 24 hour  Intake 3022.5 ml  Output   2635 ml  Net  387.5 ml   Filed Weights   11/17/12 0253 11/18/12 0000 11/19/12 0500  Weight: 136.7 kg (301 lb 5.9 oz) 140.6 kg (309 lb 15.5 oz) 140.2 kg (309 lb 1.4 oz)    Exam:   General:  Pt is alert and orientedx3 with NG in place  Cardiovascular: RRR, No MRG  Respiratory: CTA BL, no wheezes  Abdomen: soft, obese, decreased BS, incisional wounds with -dressing clean and dry, clear drainage with yellowish tint from JP   Extremities: no cyanosis or clubbing.   Data Reviewed: Basic Metabolic Panel:  Recent Labs Lab 11/15/12 0440 11/16/12 0945 11/17/12 0327 11/18/12 0349 11/19/12 0327  NA 132* 134* 132* 135 139  K 4.9 4.7 5.0 4.5 4.5  CL 95* 98 99 103 106  CO2 28 29 26 26 29   GLUCOSE 168* 184* 192* 185* 107*  BUN 54* 45* 48* 46* 29*  CREATININE 1.56* 1.46* 1.34 1.05 0.99  CALCIUM 9.0 8.8 8.9 8.9 9.0   Liver Function Tests: No results found for this basename: AST, ALT, ALKPHOS, BILITOT, PROT, ALBUMIN,  in the last 168 hours  No results found for this basename: LIPASE, AMYLASE,  in the last 168 hours No results found for this basename: AMMONIA,  in the last 168 hours CBC:  Recent Labs Lab 11/15/12 0440 11/16/12 0333 11/17/12 0327 11/18/12 0349 11/19/12 0327  WBC 15.2* 17.4* 23.7* 17.9* 11.4*  HGB 13.2 12.6* 11.8* 10.0* 9.3*  HCT 40.4 39.4 37.1* 30.2* 28.4*  MCV 89.4 90.8 90.9 89.9 90.4  PLT 286 274 279 269 276   Cardiac Enzymes: No results found for this basename: CKTOTAL, CKMB, CKMBINDEX, TROPONINI,  in the  last 168 hours BNP (last 3 results) No results found for this basename: PROBNP,  in the last 8760 hours CBG:  Recent Labs Lab 11/18/12 1552 11/18/12 1913 11/19/12 0015 11/19/12 0439 11/19/12 0734  GLUCAP 87 85 96 100* 96    Recent Results (from the past 240 hour(s))  SURGICAL PCR SCREEN     Status: Abnormal   Collection Time    11/13/12  3:17 PM      Result Value Range Status   MRSA, PCR NEGATIVE  NEGATIVE Final   Staphylococcus aureus POSITIVE (*) NEGATIVE Final   Comment:            The Xpert SA Assay (FDA     approved for NASAL specimens     in patients over 32 years of age),     is one component of     a comprehensive surveillance     program.  Test performance has     been validated by [employer] for patients greater     than or equal to 63 year old.     It is not intended     to diagnose infection nor to     guide or monitor treatment.     Studies: Dg Abd 1 View  11/17/2012  *RADIOLOGY REPORT*  Clinical Data: Abdominal pain post lumbar surgery.  ABDOMEN - 1 VIEW  Comparison: 11/15/2012 and earlier studies  Findings: There is gaseous distention of the stomach.  Small bowel and colon decompressed.  Skin staples project over the lower lumbar spine.  IMPRESSION:  Gaseous dilatation of the stomach.   Original Report Authenticated By: D. Wallace Going, MD    Ct Abdomen Pelvis W Contrast  11/17/2012  *RADIOLOGY REPORT*  Clinical Data: 79 year old male with hematemesis, abdominal pelvic pain and altered mental status. Recent lumbar surgery.  CT ABDOMEN AND PELVIS WITH CONTRAST  Technique:  Multidetector CT imaging of the abdomen and pelvis was performed following the standard protocol during bolus administration of intravenous contrast.  Contrast: 165mL OMNIPAQUE IOHEXOL 300 MG/ML  SOLN  Comparison: 11/17/2012 and prior radiographs.  Findings: A large amount of pneumoperitoneum is identified compatible with bowel perforation.  The source of this free air is difficult to  determine but most of the free air is located within the upper abdomen. Gastric distention is noted. No other bowel abnormalities are identified.  The appendix is normal.  Moderate bibasilar atelectasis noted. A tiny amount of ascites is present.  The liver, gallbladder, spleen, adrenal glands, pancreas and kidneys are unremarkable except for bilateral renal atrophy and a probable left renal cyst.  There is no evidence of enlarged lymph nodes, biliary dilatation or abdominal aortic aneurysm.  The bladder is unremarkable.  Postoperative changes and gas posterior to the lower lumbar spine is not unexpected given recent surgery. Degenerative disc disease and spondylosis within the lumbar spine identified.  No acute or suspicious bony abnormalities are identified.  IMPRESSION: Large amount of pneumoperitoneum compatible with bowel perforation. The source of this free air is not identified but this free air is located primarily in the upper abdomen suggesting possible gastric perforation.  Postoperative changes and gas along the lower lumbar spine - not unexpected given recent surgery.  Moderate bibasilar atelectasis.  Tiny amount of ascites.  Critical Value/emergent results were called by telephone at the time of interpretation on 11/17/2012 at 5:25 p.m. to Dr. Wendee Beavers, who verbally acknowledged these results.   Original Report Authenticated By: Margarette Canada, M.D.     Scheduled Meds: . antiseptic oral rinse  15 mL Mouth Rinse q12n4p  . chlorhexidine  15 mL Mouth Rinse BID  . insulin aspart  0-15 Units Subcutaneous TID WC  . insulin aspart  0-5 Units Subcutaneous QHS  . piperacillin-tazobactam (ZOSYN)  IV  3.375 g Intravenous Q8H  . polyvinyl alcohol  1 drop Both Eyes BID   Continuous Infusions: . dextrose 5 % and 0.9% NaCl 100 mL (11/18/12 2159)  . pantoprozole (PROTONIX) infusion 8 mg/hr (11/19/12 0851)    Principal Problem:   Acute back pain Active Problems:   Diabetes   History of back surgery    Obesity   Leukocytosis, unspecified   Lumbosacral radiculopathy at L5   Herniated lumbar intervertebral disc   Hyperglycemia   Essential hypertension, benign   Unspecified constipation   Elevated serum creatinine   Hypotension, unspecified   Somnolence    Time spent: 25 minutes    [facility] Pager [phone]. If 7PM-7AM, please contact night-coverage at [email or web address], password [facility] 11/19/2012, 8:54 AM  LOS: 10 days

## 2012-11-19 NOTE — Progress Notes (Signed)
Physical Therapy Treatment Patient Details Name: [name] MRN: [id number] DOB: 1932/05/07 Today's Date: 11/19/2012 Time: [id number] PT Time Calculation (min): 23 min  PT Assessment / Plan / Recommendation Comments on Treatment Session  Pt continues to progress with amb, however still has difficulty with bed mobility.  Also continue to provide MAX cues for back precautions.     Follow Up Recommendations  SNF     Does the patient have the potential to tolerate intense rehabilitation     Barriers to Discharge        Equipment Recommendations  Rolling walker with 5" wheels    Recommendations for Other Services    Frequency Min 5X/week   Plan Discharge plan remains appropriate    Precautions / Restrictions Precautions Precautions: Back Precaution Comments: Provided handout with max cues for pt to follow during mobility.  Also educated wife/family on back precautions.    Pertinent Vitals/Pain 4/10    Mobility  Bed Mobility Bed Mobility: Right Sidelying to Sit;Sit to Sidelying Right Rolling Right:  (Pt already on right side. ) Right Sidelying to Sit: 1: +2 Total assist;With rails Right Sidelying to Sit: Patient Percentage: 60% Sit to Sidelying Right: 1: +2 Total assist Sit to Sidelying Right: Patient Percentage: 50% Details for Bed Mobility Assistance: Pt able to bring LEs out of bed in sidelying, however requires assist for trunk into sitting position.  Also requires assist for trunk and LEs into bed.  MAX cues for technique and safety.  Noted pt to get very SOB with bed mobility with increased respirations.  Cues for pursed lip breathing and slow deep breathign.  Transfers Transfers: Sit to Stand;Stand to Sit Sit to Stand: 1: +2 Total assist;From bed;With upper extremity assist Sit to Stand: Patient Percentage: 60% Stand to Sit: 1: +2 Total assist;To bed Stand to Sit: Patient Percentage: 60% Details for Transfer Assistance: Assist to rise and steady.  Pt has to stand  momentarily once in standing due to some mild dizziness.  Ambulation/Gait Ambulation/Gait Assistance: 1: +2 Total assist Ambulation/Gait: Patient Percentage: 70% Ambulation Distance (Feet): 70 Feet Assistive device: Rolling walker (bari) Ambulation/Gait Assistance Details: Cues for upright posture, maintaining position inside RW and for pursed lip breathing.  Ambulated on 1L O2 with SaO2 in low 90's throughout.  Gait Pattern: Step-through pattern;Decreased stride length;Antalgic;Decreased weight shift to left Gait velocity: decreased    Exercises     PT Diagnosis:    PT Problem List:   PT Treatment Interventions:     PT Goals Acute Rehab PT Goals PT Goal Formulation: Patient unable to participate in goal setting Time For Goal Achievement: 11/27/12 Potential to Achieve Goals: Good Pt will Roll Supine to Right Side: with supervision PT Goal: Rolling Supine to Right Side - Progress: Progressing toward goal Pt will Roll Supine to Left Side: with supervision PT Goal: Rolling Supine to Left Side - Progress: Progressing toward goal Pt will go Supine/Side to Sit: with supervision PT Goal: Supine/Side to Sit - Progress: Progressing toward goal Pt will Sit at Edge of Bed: with supervision PT Goal: Sit at Edge Of Bed - Progress: Met Pt will go Sit to Supine/Side: with supervision PT Goal: Sit to Supine/Side - Progress: Progressing toward goal Pt will go Sit to Stand: with supervision PT Goal: Sit to Stand - Progress: Progressing toward goal Pt will go Stand to Sit: with supervision PT Goal: Stand to Sit - Progress: Progressing toward goal Pt will Ambulate: 16 - 50 feet;with supervision;with rolling walker PT Goal:  Ambulate - Progress: Progressing toward goal  Visit Information  Last PT Received On: 11/19/12 Assistance Needed: +2    Subjective Data  Subjective: I'll get up and walk.    Cognition  Cognition Overall Cognitive Status: Impaired Area of Impairment: Memory;Following  commands Arousal/Alertness: Lethargic Orientation Level: Appears intact for tasks assessed Behavior During Session: Restless Memory: Decreased recall of precautions Following Commands: Follows one step commands inconsistently Safety/Judgement: Decreased awareness of safety precautions;Decreased safety judgement for tasks assessed;Impulsive Safety/Judgement - Other Comments: Pt does well with amb, however is very impulsive and unsafe with bed mobility.     Balance     End of Session PT - End of Session Equipment Utilized During Treatment: Oxygen Activity Tolerance: Patient tolerated treatment well Patient left: in bed;with call bell/phone within reach;with family/visitor present Nurse Communication: Mobility status   GP     Denice Bors 11/19/2012, 4:10 PM

## 2012-11-19 NOTE — Progress Notes (Signed)
[id number] Rosana Hoes, RN, BSN, CCM:  CHART REVIEWED AND UPDATED.  Next chart review due on [id number]. NO DISCHARGE NEEDS PRESENT AT THIS TIME. CASE MANAGEMENT [phone]

## 2012-11-19 NOTE — Progress Notes (Signed)
2 Days Post-Op  Subjective: Appears fairly comfortable., incisions look good, drainage from the JP is clear.  Back and stomach are tender.  No real complaints.  Objective: Vital signs in last 24 hours: Temp:  [98.5 F (36.9 C)-99.3 F (37.4 C)] 98.5 F (36.9 C) (03/24 0400) Pulse Rate:  [90-103] 90 (03/24 0700) Resp:  [15-23] 19 (03/24 0700) BP: (86-154)/(41-85) 123/48 mmHg (03/24 0700) SpO2:  [91 %-100 %] 100 % (03/24 0700) Weight:  [309 lb 1.4 oz (140.2 kg)] 309 lb 1.4 oz (140.2 kg) (03/24 0500) Last BM Date: 11/17/12  Afebrile, BP is improving, 1 episode of hypotension, 800 ml from NG yesterday, labs are stable,   Intake/Output from previous day: 03/23 0701 - 03/24 0700 In: 3172.5 [I.V.:2975; NG/GT:60; IV Piggyback:137.5] Out: 2785 [Urine:1875; Emesis/NG output:800; Drains:110] Intake/Output this shift:    General appearance: alert, cooperative and no distress GI: NO bowels sounds, no flatus, incisions ok, drainage is clear.  Ecchymosis left side and back.  Lab Results:   Recent Labs  11/18/12 0349 11/19/12 0327  WBC 17.9* 11.4*  HGB 10.0* 9.3*  HCT 30.2* 28.4*  PLT 269 276    BMET  Recent Labs  11/18/12 0349 11/19/12 0327  NA 135 139  K 4.5 4.5  CL 103 106  CO2 26 29  GLUCOSE 185* 107*  BUN 46* 29*  CREATININE 1.05 0.99  CALCIUM 8.9 9.0   PT/INR No results found for this basename: LABPROT, INR,  in the last 72 hours  No results found for this basename: AST, ALT, ALKPHOS, BILITOT, PROT, ALBUMIN,  in the last 168 hours   Lipase  No results found for this basename: lipase     Studies/Results: Dg Abd 1 View  11/17/2012  *RADIOLOGY REPORT*  Clinical Data: Abdominal pain post lumbar surgery.  ABDOMEN - 1 VIEW  Comparison: 11/15/2012 and earlier studies  Findings: There is gaseous distention of the stomach.  Small bowel and colon decompressed.  Skin staples project over the lower lumbar spine.  IMPRESSION:  Gaseous dilatation of the stomach.   Original  Report Authenticated By: D. Wallace Going, MD    Ct Abdomen Pelvis W Contrast  11/17/2012  *RADIOLOGY REPORT*  Clinical Data: 79 year old male with hematemesis, abdominal pelvic pain and altered mental status. Recent lumbar surgery.  CT ABDOMEN AND PELVIS WITH CONTRAST  Technique:  Multidetector CT imaging of the abdomen and pelvis was performed following the standard protocol during bolus administration of intravenous contrast.  Contrast: 148mL OMNIPAQUE IOHEXOL 300 MG/ML  SOLN  Comparison: 11/17/2012 and prior radiographs.  Findings: A large amount of pneumoperitoneum is identified compatible with bowel perforation.  The source of this free air is difficult to determine but most of the free air is located within the upper abdomen. Gastric distention is noted. No other bowel abnormalities are identified.  The appendix is normal.  Moderate bibasilar atelectasis noted. A tiny amount of ascites is present.  The liver, gallbladder, spleen, adrenal glands, pancreas and kidneys are unremarkable except for bilateral renal atrophy and a probable left renal cyst.  There is no evidence of enlarged lymph nodes, biliary dilatation or abdominal aortic aneurysm.  The bladder is unremarkable.  Postoperative changes and gas posterior to the lower lumbar spine is not unexpected given recent surgery. Degenerative disc disease and spondylosis within the lumbar spine identified.  No acute or suspicious bony abnormalities are identified.  IMPRESSION: Large amount of pneumoperitoneum compatible with bowel perforation. The source of this free air is not identified but  this free air is located primarily in the upper abdomen suggesting possible gastric perforation.  Postoperative changes and gas along the lower lumbar spine - not unexpected given recent surgery.  Moderate bibasilar atelectasis.  Tiny amount of ascites.  Critical Value/emergent results were called by telephone at the time of interpretation on 11/17/2012 at 5:25 p.m. to Dr.  Wendee Beavers, who verbally acknowledged these results.   Original Report Authenticated By: Margarette Canada, M.D.     Medications: . antiseptic oral rinse  15 mL Mouth Rinse q12n4p  . chlorhexidine  15 mL Mouth Rinse BID  . insulin aspart  0-15 Units Subcutaneous TID WC  . insulin aspart  0-5 Units Subcutaneous QHS  . piperacillin-tazobactam (ZOSYN)  IV  3.375 g Intravenous Q8H  . polyvinyl alcohol  1 drop Both Eyes BID   . dextrose 5 % and 0.9% NaCl 100 mL (11/18/12 2159)  . pantoprozole (PROTONIX) infusion 8 mg/hr (11/18/12 2154)   Prior to Admission medications   Medication Sig Start Date End Date Taking? Authorizing Provider  Ascorbic Acid (VITAMIN C) 1000 MG tablet Take 1,000 mg by mouth daily.   Yes Historical Provider, MD  aspirin 325 MG EC tablet Take 325 mg by mouth daily.   Yes Historical Provider, MD  dextran 70-hypromellose (TEARS RENEWED) ophthalmic solution Place 1 drop into both eyes 2 (two) times daily.   Yes Historical Provider, MD  Ergocalciferol (VITAMIN D2) 2000 UNITS TABS Take 1 tablet by mouth at bedtime.    Yes Historical Provider, MD  galantamine (RAZADYNE ER) 16 MG 24 hr capsule Take 16 mg by mouth daily with breakfast.   Yes Historical Provider, MD  glyBURIDE (DIABETA) 5 MG tablet Take 5 mg by mouth daily with breakfast. Take 2 tablets in the morning and 1 tablet in the evening   Yes Historical Provider, MD  hydrochlorothiazide (HYDRODIURIL) 25 MG tablet Take 25 mg by mouth daily.   Yes Historical Provider, MD  HYDROcodone-acetaminophen (NORCO) 10-325 MG per tablet Take 2 tablets by mouth every 4 (four) hours as needed for pain.   Yes Historical Provider, MD  ketorolac (TORADOL) 10 MG tablet Take 10 mg by mouth every 6 (six) hours. Scheduled.   Yes Historical Provider, MD  lisinopril (PRINIVIL,ZESTRIL) 20 MG tablet Take 20 mg by mouth daily.   Yes Historical Provider, MD  metFORMIN (GLUCOPHAGE) 500 MG tablet Take 1,000 mg by mouth 2 (two) times daily with a meal.    Yes  Historical Provider, MD  methocarbamol (ROBAXIN) 750 MG tablet Take 750 mg by mouth 4 (four) times daily as needed (muscle relaxer).   Yes Historical Provider, MD  Multiple Vitamins-Minerals (ICAPS AREDS FORMULA PO) Take 1 tablet by mouth 2 (two) times daily.   Yes Historical Provider, MD  Omega-3 Fatty Acids (FISH OIL) 1000 MG CAPS Take 4 capsules by mouth daily.    Yes Historical Provider, MD  pioglitazone (ACTOS) 30 MG tablet Take 30 mg by mouth daily.   Yes Historical Provider, MD  simvastatin (ZOCOR) 40 MG tablet Take 40 mg by mouth every evening.   Yes Historical Provider, MD  traMADol (ULTRAM) 50 MG tablet Take 100 mg by mouth every 6 (six) hours as needed for pain.    Yes Historical Provider, MD  traZODone (DESYREL) 100 MG tablet Take 100 mg by mouth at bedtime.   Yes Historical Provider, MD  venlafaxine XR (EFFEXOR-XR) 150 MG 24 hr capsule Take 150 mg by mouth daily after supper.    Yes Historical Provider, MD  vitamin B-12 (CYANOCOBALAMIN) 1000 MCG tablet Take 1,000 mcg by mouth every 30 (thirty) days.   Yes Historical Provider, MD    Assessment/Plan Spondylosis with compression of L5 nerve root, L4-5 left.  S/p Re-exploration of L4-5 interspace with lateral decompression  nd decompression of the L5 nerve root. 11/16/2012, [name], MD Dressing changed by DR. Aplington this AM.  And looks good.  LAPAROSCOPY DIAGNOSTIC;  Repair of gastric perforation, drain placement,  11/17/2012, [name], DO. POD 2 AODM OBESITY Body mass index is 43.1 ARF- resolving Hypertension  PTSD ([place])  Plan;  Appears stable this AM, 1 episode of hypotension recorded this AM. I would continue NG suction for now, and keep NPO except for a few ice chips. Mobilize, and IS.  Check H pylori, and defer placement to Medicine attending.      LOS: 10 days    Quinci Gavidia 11/19/2012

## 2012-11-19 NOTE — Clinical Social Work Placement (Signed)
Clinical Social Work Department CLINICAL SOCIAL WORK PLACEMENT NOTE 11/19/2012  Patient:  [name], [name]  Account Number:  [id number] Admit date:  11/09/2012  Clinical Social Worker:  Lanell Matar  Date/time:  11/19/2012 11:57 AM  Clinical Social Work is seeking post-discharge placement for this patient at the following level of care:   SKILLED NURSING   (*CSW will update this form in Epic as items are completed)   11/16/2012  Patient/family provided with [facility] Department of Clinical Social Work's list of facilities offering this level of care within the geographic area requested by the patient (or if unable, by the patient's family).  11/16/2012  Patient/family informed of their freedom to choose among providers that offer the needed level of care, that participate in Medicare, Medicaid or managed care program needed by the patient, have an available bed and are willing to accept the patient.  11/16/2012  Patient/family informed of [facility]' ownership interest in [facility], as well as of the fact that they are under no obligation to receive care at this facility.  PASARR submitted to EDS on  PASARR number received from [facility] on   FL2 transmitted to all facilities in geographic area requested by pt/family on  11/16/2012 FL2 transmitted to all facilities within larger geographic area on   Patient informed that his/her managed care company has contracts with or will negotiate with  certain facilities, including the following:     Patient/family informed of bed offers received:  11/17/2012 Patient chooses bed at [facility] Physician recommends and patient chooses bed at    Patient to be transferred to  on   Patient to be transferred to facility by   The following physician request were entered in Epic:   Additional Comments: WIFE AND PT ACCEPT BED AT [place], LCSW ICU/Stepdown Clinical Social Worker [facility] Cell [phone] Hours 8am-1200pm M-F

## 2012-11-20 ENCOUNTER — Other Ambulatory Visit: Payer: Self-pay

## 2012-11-20 ENCOUNTER — Inpatient Hospital Stay (HOSPITAL_COMMUNITY): Payer: Medicare Other

## 2012-11-20 LAB — GLUCOSE, CAPILLARY
Glucose-Capillary: 118 mg/dL — ABNORMAL HIGH (ref 70–99)
Glucose-Capillary: 129 mg/dL — ABNORMAL HIGH (ref 70–99)

## 2012-11-20 LAB — BASIC METABOLIC PANEL
BUN: 19 mg/dL (ref 6–23)
CO2: 27 mEq/L (ref 19–32)
Calcium: 8.9 mg/dL (ref 8.4–10.5)
GFR calc non Af Amer: 80 mL/min — ABNORMAL LOW (ref >90)
Glucose, Bld: 124 mg/dL — ABNORMAL HIGH (ref 70–99)

## 2012-11-20 LAB — CBC
MCH: 29.1 pg (ref 26.0–34.0)
MCHC: 32.1 g/dL (ref 30.0–36.0)
MCV: 90.8 fL (ref 78.0–100.0)
Platelets: 293 10*3/uL (ref 150–400)
RDW: 14.5 % (ref 11.5–15.5)

## 2012-11-20 MED ORDER — IOHEXOL 300 MG/ML  SOLN
50.0000 mL | Freq: Once | INTRAMUSCULAR | Status: AC | PRN
  Administered 2012-11-20: 150 mL via ORAL

## 2012-11-20 MED ORDER — TRAZODONE HCL 50 MG PO TABS
50.0000 mg | ORAL_TABLET | Freq: Once | ORAL | Status: AC
  Administered 2012-11-20: 50 mg via ORAL
  Filled 2012-11-20: qty 1

## 2012-11-20 NOTE — Progress Notes (Signed)
ANTIBIOTIC CONSULT NOTE - FOLLOW UP  Pharmacy Consult for Zosyn Indication: r/o sepsis  No Known Allergies  Patient Measurements: Height: 5\' 11"  (180.3 cm) Weight: 310 lb 3 oz (140.7 kg) IBW/kg (Calculated) : 75.3  Vital Signs: Temp: 98.1 F (36.7 C) (03/25 0800) Temp src: Oral (03/25 0800) BP: 126/54 mmHg (03/25 0800) Pulse Rate: 73 (03/25 0800) Intake/Output from previous day: 03/24 0701 - 03/25 0700 In: 900 [I.V.:850; IV Piggyback:50] Out: [id number] [Urine:895; Emesis/NG output:150; Drains:40]  Labs:  Recent Labs  11/18/12 0349 11/19/12 0327 11/20/12 0340  WBC 17.9* 11.4* 8.0  HGB 10.0* 9.3* 9.5*  PLT 269 276 293  CREATININE 1.05 0.99 0.88   Estimated Creatinine Clearance: 99.3 ml/min (by C-G formula based on Cr of 0.88).   Anti-infectives   Start     Dose/Rate Route Frequency Ordered Stop   11/17/12 1200  piperacillin-tazobactam (ZOSYN) IVPB 3.375 g     3.375 g 12.5 mL/hr over 240 Minutes Intravenous Every 8 hours 11/17/12 1025     11/17/12 1200  vancomycin (VANCOCIN) 1,750 mg in sodium chloride 0.9 % 500 mL IVPB  Status:  Discontinued     1,750 mg 250 mL/hr over 120 Minutes Intravenous Every 24 hours 11/17/12 1027 11/17/12 2327   11/16/12 0000  ceFAZolin (ANCEF) IVPB 2 g/50 mL premix     2 g 100 mL/hr over 30 Minutes Intravenous Every 6 hours 11/15/12 2116 11/16/12 0611   11/15/12 1545  ceFAZolin (ANCEF) 3 g in dextrose 5 % 50 mL IVPB     3 g 160 mL/hr over 30 Minutes Intravenous  Once 11/15/12 1542 11/15/12 1612      Assessment: 3 YOM s/p microdiskectomy 3/20 developed leukocytosis most likely 2/2 prednisone; however, given patient's recent hypotension, empirically started on vancomycin and zosyn for r/o sepsis per pharmacy dosing. Day #4 Zosyn (Vanc d/c) and dosing remains appropriate. SCr improving, CrCl ~ 70 ml/min (normalized) WBC has decreased to WNL  Goal of Therapy:  Appropriate abx dosing, eradication of infection.   Plan:  Continue Zosyn  3.375g IV Q8H infused over 4hrs. Follow up renal function and cultures as available.  Gretta Arab PharmD, BCPS Pager [phone] 11/20/2012 11:43 AM

## 2012-11-20 NOTE — Progress Notes (Signed)
EKG was completed. Results called to [facility]. Hr stable and EKG indicated SR w/PAC.

## 2012-11-20 NOTE — Progress Notes (Signed)
Physical Therapy Treatment Patient Details Name: [name] MRN: [id number] DOB: [birth year] Today's Date: 11/20/2012 Time: [id number] PT Time Calculation (min): 21 min  PT Assessment / Plan / Recommendation Comments on Treatment Session  Pt progressing with amb, however requires assist for bed mobility.  Noted pt on 3L O2 when PT/OT arrived today.  Sats at 98% prior to amb, ambulated on 3L initially, but decreased to 1L with SaO2 at 94%.      Follow Up Recommendations  SNF     Does the patient have the potential to tolerate intense rehabilitation     Barriers to Discharge        Equipment Recommendations  Rolling walker with 5" wheels    Recommendations for Other Services    Frequency Min 5X/week   Plan Discharge plan remains appropriate    Precautions / Restrictions Precautions Precautions: Back Precaution Comments: Continue to provide education on back precautions.  Restrictions Weight Bearing Restrictions: No   Pertinent Vitals/Pain     Mobility  Bed Mobility Bed Mobility: Right Sidelying to Sit Right Sidelying to Sit: 3: Mod assist Details for Bed Mobility Assistance: Pt continues to be able to bring LEs off EOB, however requires assist for trunk to get into sitting with cues for technique and ensuring safety of lines.  Transfers Transfers: Sit to Stand;Stand to Sit Sit to Stand: 4: Min assist;With upper extremity assist;From bed Stand to Sit: 4: Min assist;With upper extremity assist;With armrests;To chair/3-in-1 Details for Transfer Assistance: Some assist to steady with cues for hand placement and safety when sitting/standing. Ambulation/Gait Ambulation/Gait Assistance: 1: +2 Total assist Ambulation/Gait: Patient Percentage: 70% Ambulation Distance (Feet): 120 Feet Assistive device: Rolling walker Ambulation/Gait Assistance Details: Cues for upright posture, maintaining position inside of RW (esp with turns) as pt tends to let RW get too far ahead of him with  amb.  Gait Pattern: Step-through pattern;Decreased stride length;Antalgic;Decreased weight shift to left Gait velocity: decreased    Exercises     PT Diagnosis:    PT Problem List:   PT Treatment Interventions:     PT Goals Acute Rehab PT Goals PT Goal Formulation: Patient unable to participate in goal setting Time For Goal Achievement: 11/27/12 Potential to Achieve Goals: Good Pt will go Supine/Side to Sit: with supervision PT Goal: Supine/Side to Sit - Progress: Progressing toward goal Pt will go Sit to Stand: with supervision PT Goal: Sit to Stand - Progress: Progressing toward goal Pt will go Stand to Sit: with supervision PT Goal: Stand to Sit - Progress: Progressing toward goal Pt will Ambulate: 16 - 50 feet;with supervision;with rolling walker PT Goal: Ambulate - Progress: Progressing toward goal  Visit Information  Last PT Received On: 11/20/12 Assistance Needed: +2 (for equipment)    Subjective Data  Subjective: Pt nodded head that he wanted to get up with therapy.    Cognition  Cognition Overall Cognitive Status: Impaired Area of Impairment: Memory;Safety/judgement;Following commands Arousal/Alertness: Lethargic Orientation Level: Appears intact for tasks assessed Behavior During Session: Lethargic Memory: Decreased recall of precautions Safety/Judgement: Decreased awareness of safety precautions;Decreased safety judgement for tasks assessed Safety/Judgement - Other Comments: Some issues with safety getting to EOB and ensuring safety of lines.  Awareness of Errors: Assistance required to identify errors made    Balance  Balance Balance Assessed: Yes Static Sitting Balance Static Sitting - Balance Support: Bilateral upper extremity supported;Feet supported Dynamic Sitting Balance Dynamic Sitting - Balance Support: No upper extremity supported;Feet supported;During functional activity Dynamic Sitting - Level of  Assistance: 5: Stand by assistance Dynamic  Sitting Balance - Compensations: Pt able to sit at EOB to don socks   End of Session PT - End of Session Equipment Utilized During Treatment: Oxygen Activity Tolerance: Patient tolerated treatment well Patient left: in chair;with call bell/phone within reach;with family/visitor present Nurse Communication: Mobility status   GP     Denice Bors 11/20/2012, 11:32 AM

## 2012-11-20 NOTE — Progress Notes (Signed)
Removed NGt from rt nare- with no difficulty.

## 2012-11-20 NOTE — Clinical Social Work Note (Signed)
CSW informed [facility] rep that Pt would like bed at that SNF when ready to d/c. CSW to follow and assist with SNF placement.   Loren Racer, LCSW ICU/Stepdown Clinical Social Worker [facility] Cell [phone] Hours 8am-1200pm M-F

## 2012-11-20 NOTE — Progress Notes (Signed)
Contacted Will, PA for instructions on route of oral solution during the UGI. Per Will, PA ok for pt to drink solution for testing. Information relayed to [place] in radiology.

## 2012-11-20 NOTE — Progress Notes (Signed)
Occupational Therapy Treatment Patient Details Name: [name] MRN: [id number] DOB: [birth year] Today's Date: 11/20/2012 Time: [id number] OT Time Calculation (min): 21 min  OT Assessment / Plan / Recommendation Comments on Treatment Session Pt improving since last OT therapy session.  Still needs mod facilitation to maintain back precautions and perform bed mobility.  Currently only able to recall 1/3 back precautions as well.  Will continue to benefit from SNF for follow-up rehab.    Follow Up Recommendations  SNF       Equipment Recommendations  None recommended by OT       Frequency Min 2X/week   Plan Discharge plan remains appropriate    Precautions / Restrictions Precautions Precautions: Back Precaution Comments: Continue to provide education on back precautions.  Restrictions Weight Bearing Restrictions: No   Pertinent Vitals/Pain HR 98-105, O2 96% or better on room air, pain not reported during session    ADL  Lower Body Dressing: Performed;Supervision/safety (to donn gripper socks with large sockaide) Where Assessed - Lower Body Dressing: Unsupported sitting Toilet Transfer: Simulated;+2 Total assistance Toilet Transfer: Patient Percentage: 70% Toilet Transfer Method: Other (comment) (ambulate with RW) Toilet Transfer Equipment:  (wide RW to bedside chair) Toileting - Clothing Manipulation and Hygiene: Simulated;+2 Total assistance Toileting - Clothing Manipulation and Hygiene: Patient Percentage: 70% Where Assessed - Camera operator Manipulation and Hygiene: Other (comment) (sit to stand from bedside chair) Transfers/Ambulation Related to ADLs: Pt able to perform mobility with min assist using the wide RW for support.  ADL Comments: Pt able to state 1/3 back precautions with mod questioning cues.  Able to utilize sockaide for donning socks and says he is familiar with the reacher because he already has one.  Still with limited endurance overall.     OT  Goals Acute Rehab OT Goals Time For Goal Achievement: 12/04/12 Potential to Achieve Goals: Good ADL Goals Pt Will Transfer to Toilet: with supervision;Extra wide 3-in-1;with DME ADL Goal: Toilet Transfer - Progress: Updated due to goal met Pt Will Perform Toileting - Clothing Manipulation: with supervision;Sitting on 3-in-1 or toilet;Standing;with adaptive equipment ADL Goal: Toileting - Clothing Manipulation - Progress: Goal set today Pt Will Perform Toileting - Hygiene: with supervision;Sit to stand from 3-in-1/toilet ADL Goal: Toileting - Hygiene - Progress: Goal set today Miscellaneous OT Goals OT Goal: Miscellaneous Goal #1 - Progress: Met Miscellaneous OT Goal #2: Pt will perform LB dressing and bathing with supervision sit to stand and AE. OT Goal: Miscellaneous Goal #2 - Progress: Updated due to goal met Miscellaneous OT Goal #3: pt will complete bed mobility following back precautions with min assist in preparation for toileting/adls OT Goal: Miscellaneous Goal #3 - Progress: Updated due to goal met  Visit Information  Last OT Received On: 11/20/12 Assistance Needed: +2    Subjective Data  Subjective: how far are we going?  I think I'm ready to turn around now. Patient Stated Goal: Pt did not state.      Cognition  Cognition Overall Cognitive Status: Impaired Area of Impairment: Memory Arousal/Alertness: Lethargic Orientation Level: Appears intact for tasks assessed Behavior During Session: [facility] for tasks performed Memory: Decreased recall of precautions Following Commands: Follows one step commands consistently Safety/Judgement: Decreased awareness of safety precautions;Decreased safety judgement for tasks assessed Safety/Judgement - Other Comments: Some issues with safety getting to EOB and ensuring safety of lines.  Awareness of Errors: Assistance required to identify errors made    Mobility  Bed Mobility Bed Mobility: Right Sidelying to Sit Right Sidelying to  Sit: 3: Mod assist Details for Bed Mobility Assistance: Pt continues to be able to bring LEs off EOB, however requires assist for trunk to get into sitting with cues for technique and ensuring safety of lines.  Transfers Transfers: Sit to Stand Sit to Stand: 4: Min assist;With upper extremity assist;From bed Stand to Sit: 4: Min assist;With upper extremity assist;With armrests;To chair/3-in-1 Details for Transfer Assistance: Pt needs cueing for hand placement with sit to stand.    Exercises      Balance Balance Balance Assessed: Yes Static Sitting Balance Static Sitting - Balance Support: Bilateral upper extremity supported;Feet supported Static Sitting - Level of Assistance: 5: Stand by assistance Dynamic Sitting Balance Dynamic Sitting - Balance Support: No upper extremity supported;Feet supported;During functional activity Dynamic Sitting - Level of Assistance: 5: Stand by assistance Dynamic Sitting Balance - Compensations: Pt able to sit at EOB to don socks    End of Session OT - End of Session Activity Tolerance: Patient limited by fatigue;Patient limited by pain Patient left: in bed;with call bell/phone within reach;with nursing in room Nurse Communication: Other (comment) (Pt requested ice chips.)     Floreine Kingdon OTR/L Pager number [phone] 11/20/2012, 12:02 PM

## 2012-11-20 NOTE — Progress Notes (Signed)
3 Days Post-Op  Subjective: Alert and no real complaints.  Objective: Vital signs in last 24 hours: Temp:  [98.2 F (36.8 C)-99.3 F (37.4 C)] 99.3 F (37.4 C) (03/25 0410) Pulse Rate:  [72-91] 76 (03/25 0645) Resp:  [14-23] 14 (03/25 0645) BP: (114-175)/(35-96) 124/84 mmHg (03/25 0645) SpO2:  [92 %-100 %] 100 % (03/25 0645) Weight:  [310 lb 3 oz (140.7 kg)] 310 lb 3 oz (140.7 kg) (03/25 0410) Last BM Date: 11/17/12  40 ml thru drain, Tm 99.1, labs OK WBC improving, 150 ml from NG, Pt reports a BM this Am.   Intake/Output from previous day: 03/24 0701 - 03/25 0700 In: 900 [I.V.:850; IV Piggyback:50] Out: [id number] [Urine:895; Emesis/NG output:150; Drains:40] Intake/Output this shift:    General appearance: alert, cooperative and no distress Resp: Rales in bases right more than left GI: soft, +BS, incisions look good.  Lab Results:   Recent Labs  11/19/12 0327 11/20/12 0340  WBC 11.4* 8.0  HGB 9.3* 9.5*  HCT 28.4* 29.6*  PLT 276 293    BMET  Recent Labs  11/19/12 0327 11/20/12 0340  NA 139 141  K 4.5 3.8  CL 106 107  CO2 29 27  GLUCOSE 107* 124*  BUN 29* 19  CREATININE 0.99 0.88  CALCIUM 9.0 8.9   PT/INR No results found for this basename: LABPROT, INR,  in the last 72 hours  No results found for this basename: AST, ALT, ALKPHOS, BILITOT, PROT, ALBUMIN,  in the last 168 hours   Lipase  No results found for this basename: lipase     Studies/Results: No results found.  Medications: . antiseptic oral rinse  15 mL Mouth Rinse q12n4p  . chlorhexidine  15 mL Mouth Rinse BID  . insulin aspart  0-15 Units Subcutaneous TID WC  . insulin aspart  0-5 Units Subcutaneous QHS  . piperacillin-tazobactam (ZOSYN)  IV  3.375 g Intravenous Q8H  . polyvinyl alcohol  1 drop Both Eyes BID    Assessment/Plan Spondylosis with compression of L5 nerve root, L4-5 left. S/p Re-exploration of L4-5 interspace with lateral decompression nd decompression of the L5 nerve  root. 11/16/2012, Justin Sinning, MD  Dressing changed by DR. Aplington this AM. And looks good.  LAPAROSCOPY DIAGNOSTIC; Repair of gastric perforation, drain placement, 11/17/2012, Justin Hook, DO.  POD 3 AODM  OBESITY Body mass index is 43.1  ARF- resolving  Hypertension  PTSD ([place])   Plan:  I would continue to ambulate/mobilize.  I will talk with Dr. Georgette Dover, and decide on UGI with gastrografin, today or tomorrow to look for leak.   LOS: 11 days    [name] 11/20/2012

## 2012-11-20 NOTE — Progress Notes (Signed)
Doing quite well Gastrografin swallow today - if no leak, will remove NG tube and start clears  Imogene Burn. Georgette Dover, MD, [facility]  11/20/2012 1:23 PM

## 2012-11-20 NOTE — Progress Notes (Signed)
[facility] PROGRESS NOTE  SAVAGE ARTRIP [id number] DOB: [birth year] DOA: 11/09/2012 PCP: No primary provider on file.  Assessment/Plan: Acute back pain MRI on 3/16 demonstrated protrusion of disc causing compression of the left L5 n. Root consistent with patient's pain. - [place] took to OR on 3/20 for microdiscectomy with L4-L5 interspace with  - Pain control per surgery.  - PT to follow  Perforated gastric Ulcer -s/p laparoscopic abdominal washout and laparoscopic repair of gastric perforation and drain placement and EGD per surgery on 3/22 - General surgery to evaluate for leak. Will f/u with their recommendations.  DM- - pt NPO secondary to perforated gastric ulcer. -continue IVF to D5NS, follow - will continue SSI, continue to hold off oral hypoglycemic agents and follow -blood glucose stable so far today 3/25  Obesity- contributing to his back pain.   Constipation- relieved after golytely - Continue stool softener dulcolax and colace  Hypotension  - During operation on 3/21 developed hypotension requiring pressors. - hydralazine, B blocker and lisinopril dc'ed on 3/22 - continue sodium chloride infusion. -No episodes of hypotension reported.  Currently improved.  Leukocytosis, - Most likely secondary to perforated gastric ulcer, and may be prednisone and recent 3/21 surgery were factors as well - On zosyn -wbc continuing to trend down and currently within normal limits.  ARF -resolved -continue holding nephrotoxic agents (lisinopril and NSAID)  -on IVF  Somnolence - resolved was likely secondary to narcotics and infection  Code Status:full Family Communication: Wife at bedside.  Disposition Plan: Once blood pressures improved and clinical status steady will transfer to floor once ok with surgeons.  Consultants:  Ortho: Dr. Shellia Carwin  General surgery: Dr. Georgette Dover  Procedures:  Refer to above  Antibiotics:  none  HPI/Subjective: Patient  States he is bruised up but that is his only complaint. No abdominal discomfort or leg pain reported today.  Objective: Filed Vitals:   11/20/12 1200 11/20/12 1400 11/20/12 1500 11/20/12 1700  BP: 165/79 156/81  126/72  Pulse: 87 90  79  Temp: 98.6 F (37 C)  98.2 F (36.8 C)   TempSrc: Oral  Oral   Resp:  21  19  Height:      Weight:      SpO2: 100% 99%  98%    Intake/Output Summary (Last 24 hours) at 11/20/12 1915 Last data filed at 11/20/12 1745  Gross per 24 hour  Intake 2288.34 ml  Output   1120 ml  Net 1168.34 ml   Filed Weights   11/18/12 0000 11/19/12 0500 11/20/12 0410  Weight: 140.6 kg (309 lb 15.5 oz) 140.2 kg (309 lb 1.4 oz) 140.7 kg (310 lb 3 oz)    Exam:   General:  Pt is alert and orientedx3 with NG in place  Cardiovascular: RRR, No MRG  Respiratory: CTA BL, no wheezes  Abdomen: soft, obese, dressing in place,  Extremities: no cyanosis or clubbing.   Data Reviewed: Basic Metabolic Panel:  Recent Labs Lab 11/16/12 0945 11/17/12 0327 11/18/12 0349 11/19/12 0327 11/20/12 0340  NA 134* 132* 135 139 141  K 4.7 5.0 4.5 4.5 3.8  CL 98 99 103 106 107  CO2 29 26 26 29 27   GLUCOSE 184* 192* 185* 107* 124*  BUN 45* 48* 46* 29* 19  CREATININE 1.46* 1.34 1.05 0.99 0.88  CALCIUM 8.8 8.9 8.9 9.0 8.9   Liver Function Tests: No results found for this basename: AST, ALT, ALKPHOS, BILITOT, PROT, ALBUMIN,  in the last 168 hours No  results found for this basename: LIPASE, AMYLASE,  in the last 168 hours No results found for this basename: AMMONIA,  in the last 168 hours CBC:  Recent Labs Lab 11/16/12 0333 11/17/12 0327 11/18/12 0349 11/19/12 0327 11/20/12 0340  WBC 17.4* 23.7* 17.9* 11.4* 8.0  HGB 12.6* 11.8* 10.0* 9.3* 9.5*  HCT 39.4 37.1* 30.2* 28.4* 29.6*  MCV 90.8 90.9 89.9 90.4 90.8  PLT 274 279 269 276 293   Cardiac Enzymes: No results found for this basename: CKTOTAL, CKMB, CKMBINDEX, TROPONINI,  in the last 168 hours BNP (last 3  results) No results found for this basename: PROBNP,  in the last 8760 hours CBG:  Recent Labs Lab 11/19/12 2350 11/20/12 0353 11/20/12 0733 11/20/12 1146 11/20/12 1553  GLUCAP 118* 109* 106* 128* 147*    Recent Results (from the past 240 hour(s))  SURGICAL PCR SCREEN     Status: Abnormal   Collection Time    11/13/12  3:17 PM      Result Value Range Status   MRSA, PCR NEGATIVE  NEGATIVE Final   Staphylococcus aureus POSITIVE (*) NEGATIVE Final   Comment:            The Xpert SA Assay (FDA     approved for NASAL specimens     in patients over 57 years of age),     is one component of     a comprehensive surveillance     program.  Test performance has     been validated by [employer] for patients greater     than or equal to 33 year old.     It is not intended     to diagnose infection nor to     guide or monitor treatment.     Studies: Dg Ugi W/water Sol Cm  11/20/2012  *RADIOLOGY REPORT*  Clinical Data:  History of previous gastric perforation.  History of previous repair.  UPPER GI SERIES WITH KUB  Technique: Limited upper GI series was performed with  120 ml of Omnipaque-300.  Fluoroscopy Time: 1.13 minutes  Comparison:  CT 11/17/2012.  Findings: On the scout images changes of degenerative spondylosis are seen.  There is an enteric tube in place with its distal portion in the fundus area.  The patient drink the contrast. Esophagus appears patent.  No aspiration occurred.  Peristalsis appeared normal.  Stomach was filled with contrast.  The patient was examined in supine, oblique, and decubitus positions.  There is no evidence of leakage of contrast from the gastrointestinal tract. The contrast in the distal stomach progressed through the pylorus into the duodenum.  IMPRESSION: I was unable to demonstrate any evidence of current  perforation or leakage of contrast from the gastrointestinal tract.   Original Report Authenticated By: Shanon Brow Call     Scheduled Meds: .  antiseptic oral rinse  15 mL Mouth Rinse q12n4p  . chlorhexidine  15 mL Mouth Rinse BID  . insulin aspart  0-15 Units Subcutaneous TID WC  . insulin aspart  0-5 Units Subcutaneous QHS  . piperacillin-tazobactam (ZOSYN)  IV  3.375 g Intravenous Q8H  . polyvinyl alcohol  1 drop Both Eyes BID   Continuous Infusions: . dextrose 5 % and 0.9% NaCl 100 mL/hr at 11/20/12 1734  . pantoprozole (PROTONIX) infusion 8 mg/hr (11/20/12 1845)    Principal Problem:   Acute back pain Active Problems:   Diabetes   History of back surgery   Obesity   Leukocytosis,  unspecified   Lumbosacral radiculopathy at L5   Herniated lumbar intervertebral disc   Hyperglycemia   Essential hypertension, benign   Unspecified constipation   Elevated serum creatinine   Hypotension, unspecified   Somnolence    Time spent: 30 minutes    Velvet Bathe  [facility] Pager [phone] If 7PM-7AM, please contact night-coverage at [email or web address], password [facility] 11/20/2012, 7:15 PM  LOS: 11 days

## 2012-11-21 ENCOUNTER — Inpatient Hospital Stay
Admission: AD | Admit: 2012-11-21 | Discharge: 2012-11-29 | Disposition: A | Discharge status: Home Health | Payer: Medicare Other | Source: Ambulatory Visit | Attending: Internal Medicine | Admitting: Internal Medicine

## 2012-11-21 DIAGNOSIS — K265 Chronic or unspecified duodenal ulcer with perforation: Secondary | ICD-10-CM | POA: Diagnosis present

## 2012-11-21 DIAGNOSIS — B9681 Helicobacter pylori [H. pylori] as the cause of diseases classified elsewhere: Secondary | ICD-10-CM | POA: Diagnosis present

## 2012-11-21 DIAGNOSIS — E119 Type 2 diabetes mellitus without complications: Secondary | ICD-10-CM

## 2012-11-21 DIAGNOSIS — M5430 Sciatica, unspecified side: Secondary | ICD-10-CM

## 2012-11-21 LAB — CBC
HCT: 31.9 % — ABNORMAL LOW (ref 39.0–52.0)
Hemoglobin: 10.3 g/dL — ABNORMAL LOW (ref 13.0–17.0)
MCV: 90.1 fL (ref 78.0–100.0)
Platelets: 343 10*3/uL (ref 150–400)
RBC: 3.54 MIL/uL — ABNORMAL LOW (ref 4.22–5.81)
WBC: 8.4 10*3/uL (ref 4.0–10.5)

## 2012-11-21 LAB — GLUCOSE, CAPILLARY
Glucose-Capillary: 109 mg/dL — ABNORMAL HIGH (ref 70–99)
Glucose-Capillary: 121 mg/dL — ABNORMAL HIGH (ref 70–99)
Glucose-Capillary: 186 mg/dL — ABNORMAL HIGH (ref 70–99)
Glucose-Capillary: 142 mg/dL — ABNORMAL HIGH (ref 70–99)

## 2012-11-21 MED ORDER — LORAZEPAM 2 MG/ML IJ SOLN
INTRAMUSCULAR | Status: AC
  Administered 2012-11-21: 1 mg via INTRAVENOUS
  Filled 2012-11-21: qty 1

## 2012-11-21 MED ORDER — HYDRALAZINE HCL 20 MG/ML IJ SOLN: 10.0000 mg | Freq: Four times a day (QID) | INTRAMUSCULAR | Status: DC | PRN

## 2012-11-21 MED ORDER — FUROSEMIDE 10 MG/ML IJ SOLN: 20.0000 mg | Freq: Every day | INTRAMUSCULAR | Status: DC

## 2012-11-21 MED ORDER — FUROSEMIDE 10 MG/ML IJ SOLN
INTRAMUSCULAR | Status: AC
  Filled 2012-11-21: qty 4

## 2012-11-21 MED ORDER — PANTOPRAZOLE SODIUM 40 MG IV SOLR: 40.0000 mg | Freq: Two times a day (BID) | INTRAVENOUS | Status: DC

## 2012-11-21 MED ORDER — FUROSEMIDE 20 MG PO TABS: 20.0000 mg | ORAL_TABLET | Freq: Two times a day (BID) | ORAL | Status: DC

## 2012-11-21 MED ORDER — FUROSEMIDE 10 MG/ML IJ SOLN
20.0000 mg | Freq: Two times a day (BID) | INTRAMUSCULAR | Status: DC
  Administered 2012-11-21 (×2): 20 mg via INTRAVENOUS
  Filled 2012-11-21: qty 2

## 2012-11-21 MED ORDER — METOPROLOL TARTRATE 1 MG/ML IV SOLN
INTRAVENOUS | Status: AC
  Filled 2012-11-21: qty 5

## 2012-11-21 MED ORDER — MORPHINE SULFATE 2 MG/ML IJ SOLN: 1.0000 mg | INTRAMUSCULAR | Status: DC | PRN

## 2012-11-21 MED ORDER — LORAZEPAM 2 MG/ML IJ SOLN: 1.0000 mg | Freq: Once | INTRAMUSCULAR | Status: AC

## 2012-11-21 MED ORDER — INSULIN ASPART 100 UNIT/ML ~~LOC~~ SOLN
0.0000 [IU] | Freq: Three times a day (TID) | SUBCUTANEOUS | Status: DC
Start: 2012-11-21 — End: 2016-07-29

## 2012-11-21 MED ORDER — MORPHINE SULFATE 4 MG/ML IJ SOLN
INTRAMUSCULAR | Status: AC
  Administered 2012-11-21: 4 mg
  Filled 2012-11-21: qty 1

## 2012-11-21 MED ORDER — METOPROLOL TARTRATE 1 MG/ML IV SOLN
5.0000 mg | Freq: Once | INTRAVENOUS | Status: AC
  Administered 2012-11-21: 5 mg via INTRAVENOUS

## 2012-11-21 NOTE — Progress Notes (Addendum)
Report given to [facility] RN with [facility] transport. Pt set up for transfer to [facility], left at 2020. VSS upon exit.

## 2012-11-21 NOTE — Progress Notes (Signed)
Imogene Burn. Georgette Dover, MD, [facility]  11/21/2012 1:39 PM

## 2012-11-21 NOTE — Progress Notes (Signed)
[facility] PROGRESS NOTE  PRESTEN BEH [id number] DOB: 01/27/1932 DOA: 11/09/2012 PCP: No primary provider on file.  Assessment/Plan: Herniated disc: /back pain MRI on 3/16 demonstrated protrusion of disc causing compression of the left L5 n. Root consistent with patient's pain. - s/p OR on 3/20 for microdiscectomy with L4-L5  with decompression laminectomy - PT following  Perforated gastric Ulcer -s/p laparoscopic abdominal washout and laparoscopic repair of gastric perforation and drain placement and EGD per surgery on 3/22 - s/p upper Gi series 3/25, demonstrated no leak, started clears last pm -per [facility]  HTN: malignant with volume overload -cut down IVF -lasix 20mg  Q12 -IV hydralazine PRN  DM- - pt NPO secondary to perforated gastric ulcer. - cut down iVF - will continue SSI, continue to hold off oral hypoglycemic agents and follow - blood glucose stable   Obesity - contributing to his back pain.   ARF -resolved -continue holding nephrotoxic agents (lisinopril and NSAID)   Somnolence - resolved was likely secondary to narcotics and infection  Code Status:full Family Communication: d/w pt only at bedside Disposition Plan: Tx to floor once BP better  Consultants:  Ortho: Dr. Shellia Carwin  General surgery: Dr. Georgette Dover  Procedures:  Refer to above  Antibiotics:  Zosyn 3/22  HPI/Subjective: Back pain bearable, no SOB, drank apple juice last pm  Objective: Filed Vitals:   11/20/12 2000 11/21/12 0000 11/21/12 0400 11/21/12 0452  BP: 150/66 178/94  179/82  Pulse: 79 86  83  Temp: 98 F (36.7 C) 98.1 F (36.7 C) 98.5 F (36.9 C)   TempSrc: Oral Oral Oral   Resp: 22 23    Height:      Weight:      SpO2: 99% 98%  96%    Intake/Output Summary (Last 24 hours) at 11/21/12 0725 Last data filed at 11/21/12 0451  Gross per 24 hour  Intake 3738.34 ml  Output   1160 ml  Net 2578.34 ml   Filed Weights   11/18/12 0000 11/19/12 0500 11/20/12  0410  Weight: 140.6 kg (309 lb 15.5 oz) 140.2 kg (309 lb 1.4 oz) 140.7 kg (310 lb 3 oz)    Exam:   General:  Pt is alert and orientedx3   Cardiovascular: RRR, No MRG  Respiratory: faint basilar crackles  Abdomen: soft, obese, dressing in place,  Extremities: no cyanosis or clubbing.   Data Reviewed: Basic Metabolic Panel:  Recent Labs Lab 11/16/12 0945 11/17/12 0327 11/18/12 0349 11/19/12 0327 11/20/12 0340  NA 134* 132* 135 139 141  K 4.7 5.0 4.5 4.5 3.8  CL 98 99 103 106 107  CO2 29 26 26 29 27   GLUCOSE 184* 192* 185* 107* 124*  BUN 45* 48* 46* 29* 19  CREATININE 1.46* 1.34 1.05 0.99 0.88  CALCIUM 8.8 8.9 8.9 9.0 8.9   Liver Function Tests: No results found for this basename: AST, ALT, ALKPHOS, BILITOT, PROT, ALBUMIN,  in the last 168 hours No results found for this basename: LIPASE, AMYLASE,  in the last 168 hours No results found for this basename: AMMONIA,  in the last 168 hours CBC:  Recent Labs Lab 11/17/12 0327 11/18/12 0349 11/19/12 0327 11/20/12 0340 11/21/12 0335  WBC 23.7* 17.9* 11.4* 8.0 8.4  HGB 11.8* 10.0* 9.3* 9.5* 10.3*  HCT 37.1* 30.2* 28.4* 29.6* 31.9*  MCV 90.9 89.9 90.4 90.8 90.1  PLT 279 269 276 293 343   Cardiac Enzymes: No results found for this basename: CKTOTAL, CKMB, CKMBINDEX, TROPONINI,  in the  last 168 hours BNP (last 3 results) No results found for this basename: PROBNP,  in the last 8760 hours CBG:  Recent Labs Lab 11/20/12 0733 11/20/12 1146 11/20/12 1553 11/20/12 2016 11/20/12 2226  GLUCAP 106* 128* 147* 129* 126*    Recent Results (from the past 240 hour(s))  SURGICAL PCR SCREEN     Status: Abnormal   Collection Time    11/13/12  3:17 PM      Result Value Range Status   MRSA, PCR NEGATIVE  NEGATIVE Final   Staphylococcus aureus POSITIVE (*) NEGATIVE Final   Comment:            The Xpert SA Assay (FDA     approved for NASAL specimens     in patients over 77 years of age),     is one component of     a  comprehensive surveillance     program.  Test performance has     been validated by [employer] for patients greater     than or equal to 47 year old.     It is not intended     to diagnose infection nor to     guide or monitor treatment.     Studies: Dg Ugi W/water Sol Cm  11/20/2012  *RADIOLOGY REPORT*  Clinical Data:  History of previous gastric perforation.  History of previous repair.  UPPER GI SERIES WITH KUB  Technique: Limited upper GI series was performed with  120 ml of Omnipaque-300.  Fluoroscopy Time: 1.13 minutes  Comparison:  CT 11/17/2012.  Findings: On the scout images changes of degenerative spondylosis are seen.  There is an enteric tube in place with its distal portion in the fundus area.  The patient drink the contrast. Esophagus appears patent.  No aspiration occurred.  Peristalsis appeared normal.  Stomach was filled with contrast.  The patient was examined in supine, oblique, and decubitus positions.  There is no evidence of leakage of contrast from the gastrointestinal tract. The contrast in the distal stomach progressed through the pylorus into the duodenum.  IMPRESSION: I was unable to demonstrate any evidence of current  perforation or leakage of contrast from the gastrointestinal tract.   Original Report Authenticated By: Shanon Brow Call     Scheduled Meds: . antiseptic oral rinse  15 mL Mouth Rinse q12n4p  . chlorhexidine  15 mL Mouth Rinse BID  . furosemide  20 mg Intravenous BID  . insulin aspart  0-15 Units Subcutaneous TID WC  . insulin aspart  0-5 Units Subcutaneous QHS  . piperacillin-tazobactam (ZOSYN)  IV  3.375 g Intravenous Q8H  . polyvinyl alcohol  1 drop Both Eyes BID   Continuous Infusions: . dextrose 5 % and 0.9% NaCl 1,000 mL (11/21/12 0224)  . pantoprozole (PROTONIX) infusion 8 mg/hr (11/21/12 [id number])    Principal Problem:   Acute back pain Active Problems:   Diabetes   History of back surgery   Obesity   Leukocytosis, unspecified    Lumbosacral radiculopathy at L5   Herniated lumbar intervertebral disc   Hyperglycemia   Essential hypertension, benign   Unspecified constipation   Elevated serum creatinine   Hypotension, unspecified   Somnolence    Time spent: 30 minutes    Bessie,Yessenia Maillet  [facility] Pager [phone] If 7PM-7AM, please contact night-coverage at [email or web address], password [facility] 11/21/2012, 7:25 AM  LOS: 12 days

## 2012-11-21 NOTE — Discharge Summary (Signed)
Physician Discharge Summary  MINATO DELANUEZ [id number] DOB: [birth year] DOA: 11/09/2012  PCP: No primary provider on file.  Admit date: 11/09/2012 Discharge date: 11/21/2012  Time spent: 45 minutes  Recommendations for Outpatient Follow-up:  1. [facility], Dr.Layton in 1-2 weeks 2. Dr.Scott Marlou Sa, Orthopedics in 2 weeks  Discharge Diagnoses:    Herniated L4L5 disc  Severe low back pain  Perforated gastric ulcer   Diabetes   History of back surgery   Obesity   Leukocytosis, unspecified   Lumbosacral radiculopathy at L5   Herniated lumbar intervertebral disc   Hyperglycemia   Essential hypertension, benign   Unspecified constipation   Elevated serum creatinine   Hypotension, unspecified   Somnolence   Discharge Condition: stable  Diet recommendation: full liquid diet, advance slowly as tolerated  Filed Weights   11/18/12 0000 11/19/12 0500 11/20/12 0410  Weight: 140.6 kg (309 lb 15.5 oz) 140.2 kg (309 lb 1.4 oz) 140.7 kg (310 lb 3 oz)    History of present illness:  [name] is a 79 y.o. male who has a history of back surgery x 3 (one done in 04/2005-Decompression lateral recess and excision of herniated nucleus pulposus at L4-5 left.) Jeffery Bean.  He has been having progressive back pain for several months. [facility]- found to have bulging disc at L4/L5.  He then presented to the ER on 3/12 with back and hip pain. X ray then found mild to moderate left hip DJD. He was d/c'd to follow up with ortho on 3/24. He presents back to the ER today with increasing pain. Pain starts in lower back and radiates down his left leg to above his ankle. ER doctor spoke with orthopedic Dr. Rolena Infante who did not think any intervention was needed for his hip due to his age.  No BM x 4 days. Pain is a 10/10- pain medications help but patient says he is unable to walk at home due to the pain. His wife has been caring for him but is unable to do so as there  are 13 steps in their house.    Hospital Course:   1. Herniated disc: /back pain:  MRI on 3/16 demonstrated protrusion of disc causing compression of the left L5 n.root consistent with patient's pain, this was uncontrolled despite aggressive medical management and finally underwent surgery.  - s/p OR on 3/20 for microdiscectomy with L4-L5 with decompression laminectomy  - PT following, needs ongoing physical rehabilitation  2. Perforated gastric Ulcer  -s/p laparoscopic abdominal washout and laparoscopic repair of gastric perforation and drain placement and EGD per surgery on 3/22  - s/p upper Gi series 3/25, demonstrated no leak, started clears last pm and advanced to full liquids per [facility] -per [facility]   3. HTN: malignant with volume overload  -cut down IVF  -lasix 20mg  Q12  -IV hydralazine PRN   4. DM- - starting PO  - cut down iVF  - will continue SSI, continue to hold off oral hypoglycemic agents and follow - blood glucose stable   5. ARF  -resolved  -continue holding nephrotoxic agents (lisinopril and NSAID)   6. Somnolence  - resolved was likely secondary to narcotics and infection   Code Status:full   Procedures: 1. PROCEDURE: Dr.James Applington, Orthopedics, on 11/15/12 MICRODISCECTOMY L4 - L5 on the LEFT 1 LEVEL (Left) - REPEAT DECOMPRESSION LAMINECTOMY L4-L5 LEFT/INSPECTION L4-L5 DISC  2. PROCEDURE: Diagnostic laparoscopy with laparoscopic abdominal washout  and laparoscopic repair  of gastric perforation and drain placement and  EGD. By Melanie Crazier, 11/17/12   Consultations:  [facility]-Dr.LAyton  ORtho Dr.Applington  Discharge Exam: Filed Vitals:   11/21/12 0452 11/21/12 0747 11/21/12 0800 11/21/12 0900  BP: 179/82 145/77  162/145  Pulse: 83 84  86  Temp:   98.5 F (36.9 C)   TempSrc:   Oral   Resp:  21  23  Height:      Weight:      SpO2: 96% 98%  96%    General: AAOx3 Cardiovascular: S1S2/RRR Respiratory: faint basilar crackles  Discharge  Instructions  Discharge Orders   Future Orders Complete By Expires     Discharge instructions  As directed     Comments:      Full liquid diet, advance slowly as tolerated    Increase activity slowly  As directed         Medication List    STOP taking these medications       aspirin 325 MG EC tablet     Fish Oil 1000 MG Caps     glyBURIDE 5 MG tablet  Commonly known as:  DIABETA     ICAPS AREDS FORMULA PO     ketorolac 10 MG tablet  Commonly known as:  TORADOL     lisinopril 20 MG tablet  Commonly known as:  PRINIVIL,ZESTRIL     metFORMIN 500 MG tablet  Commonly known as:  GLUCOPHAGE     methocarbamol 750 MG tablet  Commonly known as:  ROBAXIN     pioglitazone 30 MG tablet  Commonly known as:  ACTOS     traMADol 50 MG tablet  Commonly known as:  ULTRAM      TAKE these medications       dextran 70-hypromellose ophthalmic solution  Place 1 drop into both eyes 2 (two) times daily.     furosemide 20 MG tablet  Commonly known as:  LASIX  Take 1 tablet (20 mg total) by mouth 2 (two) times daily.     galantamine 16 MG 24 hr capsule  Commonly known as:  RAZADYNE ER  Take 16 mg by mouth daily with breakfast.     hydrochlorothiazide 25 MG tablet  Commonly known as:  HYDRODIURIL  Take 25 mg by mouth daily.     HYDROcodone-acetaminophen 10-325 MG per tablet  Commonly known as:  NORCO  Take 2 tablets by mouth every 4 (four) hours as needed for pain.     insulin aspart 100 UNIT/ML injection  Commonly known as:  novoLOG  Inject 0-15 Units into the skin 3 (three) times daily with meals. Per sensitive sliding scale     morphine 2 MG/ML injection  Inject 0.5 mLs (1 mg total) into the vein every 3 (three) hours as needed.     pantoprazole 40 MG injection  Commonly known as:  PROTONIX  Inject 40 mg into the vein every 12 (twelve) hours.     simvastatin 40 MG tablet  Commonly known as:  ZOCOR  Take 40 mg by mouth every evening.     traZODone 100 MG tablet   Commonly known as:  DESYREL  Take 100 mg by mouth at bedtime.     venlafaxine XR 150 MG 24 hr capsule  Commonly known as:  EFFEXOR-XR  Take 150 mg by mouth daily after supper.     vitamin B-12 1000 MCG tablet  Commonly known as:  CYANOCOBALAMIN  Take 1,000 mcg by mouth every 30 (thirty) days.  vitamin C 1000 MG tablet  Take 1,000 mg by mouth daily.     Vitamin D2 2000 UNITS Tabs  Take 1 tablet by mouth at bedtime.           Follow-up Information   Follow up with Meredith Pel, MD. Schedule an appointment as soon as possible for a visit in 2 weeks. (As needed)    Contact information:   [place] [address] [phone]       Follow up with LAYTON, Clio, DO. Schedule an appointment as soon as possible for a visit in 2 weeks. (after discharge from [facility] if you have a problem.  They can call us if there is an issue while you are in [facility].)    Contact information:   [address] [place] [address] [phone]        The results of significant diagnostics from this hospitalization (including imaging, microbiology, ancillary and laboratory) are listed below for reference.    Significant Diagnostic Studies: Dg Lumbar Spine 2-3 Views  11/09/2012  *RADIOLOGY REPORT*  Clinical Data: There are low back pain and left leg pain.  LUMBAR SPINE - 2-3 VIEW  Comparison: Radiograph dated 05/11/2005  Findings: There is no fracture or bone destruction.  Slight narrowing of the L4-5 disc space.  Evidence of previous surgery at L4-5 and L5-S1.  No spondylolisthesis.  IMPRESSION: No acute abnormality of the lumbar spine.  Postsurgical changes at L4-5 and L5-S1.  Disc space narrowing at L4-5.   Original Report Authenticated By: Lorriane Shire, M.D.    Dg Hip Complete Left  11/07/2012  *RADIOLOGY REPORT*  Clinical Data: Left hip pain  LEFT HIP - COMPLETE 2+ VIEW  Comparison: None.  Findings: Mild to moderate  degenerative changes of the left hip. No displaced fracture.  No dislocation.  No aggressive osseous lesions.  IMPRESSION: Mild to moderate left hip DJD.  No acute osseous finding.   Original Report Authenticated By: Carlos Levering, M.D.    Dg Abd 1 View  11/17/2012  *RADIOLOGY REPORT*  Clinical Data: Abdominal pain post lumbar surgery.  ABDOMEN - 1 VIEW  Comparison: 11/15/2012 and earlier studies  Findings: There is gaseous distention of the stomach.  Small bowel and colon decompressed.  Skin staples project over the lower lumbar spine.  IMPRESSION:  Gaseous dilatation of the stomach.   Original Report Authenticated By: D. Wallace Going, MD    Mr Lumbar Spine W Wo Contrast  11/11/2012  *RADIOLOGY REPORT*  Clinical Data: Left hip and leg pain.  Difficulty walking.  Left- sided low back pain.  Prior lumbar surgery.  MRI LUMBAR SPINE WITHOUT AND WITH CONTRAST  Technique:  Multiplanar and multiecho pulse sequences of the lumbar spine were obtained without and with intravenous contrast.  Contrast: 47mL MULTIHANCE GADOBENATE DIMEGLUMINE 529 MG/ML IV SOLN  Comparison: Radiographs dated 11/09/2012 and CT myelogram dated 05/03/2005  Findings: Normal conus tip is at T12-L1.  Normal paraspinal soft tissues.  T11-12 and T12-L1:  Normal discs.  Hemangioma in L1.  Slight arthritis of the facet joints.  L1-2:  Normal disc.  Small Schmorl's node in the L2 vertebral body, unchanged since 05/03/2005.  L2-3:  Hypertrophy of the ligamenta flava facet joints congenitally short pedicles create a fairly severe spinal stenosis.  Tiny disc bulges into the neural foramina without neural impingement.  This is more severe than on the prior CT myelogram.  L3-4:  Hypertrophy of the facet joints  and ligamenta flava.  Tiny bulges of the disc into the neural foramina with no neural impingement.  Slight narrowing of the spinal canal due to short pedicles.  L4-5: There is a focal disc extrusion into the left lateral recess behind the body of  L5 which should compress the left L5 nerve. There is some enhancement around this disc fragment.  The patient has had prior surgery at L4-5.  L5 S1:  Annular tear at the level of the left neural foramen.  No bulging of the disc.  IMPRESSION: 1.  Focal small disc extrusion at L4-5 extending inferiorly in the left lateral recess compressing the left L5 nerve. 2.  Fairly severe spinal stenosis at L2-3 due to congenital short pedicles and hypertrophy of the ligamenta flava and facet joints, increased since the prior CT myelogram of 05/03/2005.   Original Report Authenticated By: Lorriane Shire, M.D.    Ct Abdomen Pelvis W Contrast  11/17/2012  *RADIOLOGY REPORT*  Clinical Data: 79 year old male with hematemesis, abdominal pelvic pain and altered mental status. Recent lumbar surgery.  CT ABDOMEN AND PELVIS WITH CONTRAST  Technique:  Multidetector CT imaging of the abdomen and pelvis was performed following the standard protocol during bolus administration of intravenous contrast.  Contrast: 13mL OMNIPAQUE IOHEXOL 300 MG/ML  SOLN  Comparison: 11/17/2012 and prior radiographs.  Findings: A large amount of pneumoperitoneum is identified compatible with bowel perforation.  The source of this free air is difficult to determine but most of the free air is located within the upper abdomen. Gastric distention is noted. No other bowel abnormalities are identified.  The appendix is normal.  Moderate bibasilar atelectasis noted. A tiny amount of ascites is present.  The liver, gallbladder, spleen, adrenal glands, pancreas and kidneys are unremarkable except for bilateral renal atrophy and a probable left renal cyst.  There is no evidence of enlarged lymph nodes, biliary dilatation or abdominal aortic aneurysm.  The bladder is unremarkable.  Postoperative changes and gas posterior to the lower lumbar spine is not unexpected given recent surgery. Degenerative disc disease and spondylosis within the lumbar spine identified.  No  acute or suspicious bony abnormalities are identified.  IMPRESSION: Large amount of pneumoperitoneum compatible with bowel perforation. The source of this free air is not identified but this free air is located primarily in the upper abdomen suggesting possible gastric perforation.  Postoperative changes and gas along the lower lumbar spine - not unexpected given recent surgery.  Moderate bibasilar atelectasis.  Tiny amount of ascites.  Critical Value/emergent results were called by telephone at the time of interpretation on 11/17/2012 at 5:25 p.m. to Dr. Wendee Beavers, who verbally acknowledged these results.   Original Report Authenticated By: Margarette Canada, M.D.    Dg Spine Portable 1 View  11/15/2012  *RADIOLOGY REPORT*  Clinical Data: Lumbar laminectomy at L4-L5.  PORTABLE SPINE - 1 VIEW  Comparison: 11/15/2012  Findings: Surgical instrument is posterior to the L5 vertebral body.  Endplate degenerative changes in the lower lumbar spine. Alignment of the lumbar spine is stable.  IMPRESSION: Surgical marking along the posterior L5 vertebral body.   Original Report Authenticated By: Markus Daft, M.D.    Dg Spine Portable 1 View  11/15/2012  *RADIOLOGY REPORT*  Clinical Data: Intraoperative localization for spine surgery.  PORTABLE SPINE - 1 VIEW  Comparison: Earlier film, same date.  Findings: There is a surgical instrument marking the L4-5 level.  IMPRESSION: L4-5 marked intraoperatively.   Original Report Authenticated By: Marijo Sanes, M.D.  Dg Spine Portable 1 View  11/15/2012  *RADIOLOGY REPORT*  Clinical Data: Back pain.  PORTABLE SPINE - 1 VIEW  Comparison: 11/11/2012.  Findings: Five lumbar type vertebral bodies are present.  Three needles have been inserted from a posterior approach.  The  upper needle tip lies roughly 4 cm posterior to the spinous process of L4.    The middle needle is directed roughly parallel to the L5-S1 interspace.  The lower needle is directed towards the midbody of S1.  IMPRESSION:  Intraoperative spine radiograph as described.   Original Report Authenticated By: Rolla Flatten, M.D.    Dg Duanne Limerick W/water Sol Cm  11/20/2012  *RADIOLOGY REPORT*  Clinical Data:  History of previous gastric perforation.  History of previous repair.  UPPER GI SERIES WITH KUB  Technique: Limited upper GI series was performed with  120 ml of Omnipaque-300.  Fluoroscopy Time: 1.13 minutes  Comparison:  CT 11/17/2012.  Findings: On the scout images changes of degenerative spondylosis are seen.  There is an enteric tube in place with its distal portion in the fundus area.  The patient drink the contrast. Esophagus appears patent.  No aspiration occurred.  Peristalsis appeared normal.  Stomach was filled with contrast.  The patient was examined in supine, oblique, and decubitus positions.  There is no evidence of leakage of contrast from the gastrointestinal tract. The contrast in the distal stomach progressed through the pylorus into the duodenum.  IMPRESSION: I was unable to demonstrate any evidence of current  perforation or leakage of contrast from the gastrointestinal tract.   Original Report Authenticated By: Shanon Brow Call     Microbiology: Recent Results (from the past 240 hour(s))  SURGICAL PCR SCREEN     Status: Abnormal   Collection Time    11/13/12  3:17 PM      Result Value Range Status   MRSA, PCR NEGATIVE  NEGATIVE Final   Staphylococcus aureus POSITIVE (*) NEGATIVE Final   Comment:            The Xpert SA Assay (FDA     approved for NASAL specimens     in patients over 102 years of age),     is one component of     a comprehensive surveillance     program.  Test performance has     been validated by [employer] for patients greater     than or equal to 79 year old.     It is not intended     to diagnose infection nor to     guide or monitor treatment.     Labs: Basic Metabolic Panel:  Recent Labs Lab 11/16/12 0945 11/17/12 0327 11/18/12 0349 11/19/12 0327 11/20/12 0340  NA  134* 132* 135 139 141  K 4.7 5.0 4.5 4.5 3.8  CL 98 99 103 106 107  CO2 29 26 26 29 27   GLUCOSE 184* 192* 185* 107* 124*  BUN 45* 48* 46* 29* 19  CREATININE 1.46* 1.34 1.05 0.99 0.88  CALCIUM 8.8 8.9 8.9 9.0 8.9   Liver Function Tests: No results found for this basename: AST, ALT, ALKPHOS, BILITOT, PROT, ALBUMIN,  in the last 168 hours No results found for this basename: LIPASE, AMYLASE,  in the last 168 hours No results found for this basename: AMMONIA,  in the last 168 hours CBC:  Recent Labs Lab 11/17/12 0327 11/18/12 0349 11/19/12 0327 11/20/12 0340 11/21/12 0335  WBC 23.7* 17.9* 11.4* 8.0 8.4  HGB 11.8* 10.0* 9.3* 9.5* 10.3*  HCT 37.1* 30.2* 28.4* 29.6* 31.9*  MCV 90.9 89.9 90.4 90.8 90.1  PLT 279 269 276 293 343   Cardiac Enzymes: No results found for this basename: CKTOTAL, CKMB, CKMBINDEX, TROPONINI,  in the last 168 hours BNP: BNP (last 3 results) No results found for this basename: PROBNP,  in the last 8760 hours CBG:  Recent Labs Lab 11/20/12 1553 11/20/12 2016 11/20/12 2226 11/21/12 0735 11/21/12 1122  GLUCAP 147* 129* 126* 109* 121*       Signed:  Coda,Naudia Crosley  [facility] 11/21/2012, 12:22 PM

## 2012-11-21 NOTE — Progress Notes (Addendum)
4 Days Post-Op  Subjective: Major complaint is his back and trouble sleeping.  Had some apple juice last PM, 1st liquid since surgery.  Objective: Vital signs in last 24 hours: Temp:  [98 F (36.7 C)-98.6 F (37 C)] 98.5 F (36.9 C) (03/26 0400) Pulse Rate:  [79-100] 84 (03/26 0747) Resp:  [19-23] 21 (03/26 0747) BP: (116-179)/(52-94) 145/77 mmHg (03/26 0747) SpO2:  [95 %-100 %] 98 % (03/26 0747) Last BM Date: 11/20/12 35 ml thru the drain. Afebrile, BP still up some labs stable. Clears Intake/Output from previous day: 03/25 0701 - 03/26 0700 In: 4558.3 [I.V.:4278.3; NG/GT:30; IV Piggyback:250] Out: 1160 [Urine:800; Emesis/NG output:325; Drains:35] Intake/Output this shift: Total I/O In: -  Out: 100 [Urine:100]  General appearance: alert, cooperative and no distress Resp: clear to auscultation bilaterally and few rales each base. GI: soft, + BS, minimal drainage from JP, incisions look great.  Lab Results:   Recent Labs  11/20/12 0340 11/21/12 0335  WBC 8.0 8.4  HGB 9.5* 10.3*  HCT 29.6* 31.9*  PLT 293 343    BMET  Recent Labs  11/19/12 0327 11/20/12 0340  NA 139 141  K 4.5 3.8  CL 106 107  CO2 29 27  GLUCOSE 107* 124*  BUN 29* 19  CREATININE 0.99 0.88  CALCIUM 9.0 8.9   PT/INR No results found for this basename: LABPROT, INR,  in the last 72 hours  No results found for this basename: AST, ALT, ALKPHOS, BILITOT, PROT, ALBUMIN,  in the last 168 hours   Lipase  No results found for this basename: lipase     Studies/Results: Dg Ugi W/water Sol Cm  11/20/2012  *RADIOLOGY REPORT*  Clinical Data:  History of previous gastric perforation.  History of previous repair.  UPPER GI SERIES WITH KUB  Technique: Limited upper GI series was performed with  120 ml of Omnipaque-300.  Fluoroscopy Time: 1.13 minutes  Comparison:  CT 11/17/2012.  Findings: On the scout images changes of degenerative spondylosis are seen.  There is an enteric tube in place with its  distal portion in the fundus area.  The patient drink the contrast. Esophagus appears patent.  No aspiration occurred.  Peristalsis appeared normal.  Stomach was filled with contrast.  The patient was examined in supine, oblique, and decubitus positions.  There is no evidence of leakage of contrast from the gastrointestinal tract. The contrast in the distal stomach progressed through the pylorus into the duodenum.  IMPRESSION: I was unable to demonstrate any evidence of current  perforation or leakage of contrast from the gastrointestinal tract.   Original Report Authenticated By: Shanon Brow Call     Medications: . antiseptic oral rinse  15 mL Mouth Rinse q12n4p  . chlorhexidine  15 mL Mouth Rinse BID  . furosemide  20 mg Intravenous BID  . insulin aspart  0-15 Units Subcutaneous TID WC  . insulin aspart  0-5 Units Subcutaneous QHS  . piperacillin-tazobactam (ZOSYN)  IV  3.375 g Intravenous Q8H  . polyvinyl alcohol  1 drop Both Eyes BID    Assessment/Plan Spondylosis with compression of L5 nerve root, L4-5 left. S/p Re-exploration of L4-5 interspace with lateral decompression nd decompression of the L5 nerve root. 11/16/2012, [name], MD  Dressing changed by DR. Aplington this AM. And looks good.  LAPAROSCOPY DIAGNOSTIC; Repair of gastric perforation, drain placement, 11/17/2012, [name], DO.  POD 3  AODM  OBESITY Body mass index is 43.1  ARF- resolving  Hypertension  PTSD ([place])  PLan;   I will leave him on clears for now, but if he does well you can advance him to full liquids at lunch or supper.  He is to go to [facility] today.  I will put follow up in [facility]. They can call our office to see in [facility] if needed.  We  Would leave drain in until he is taking a regular diet, with no change in drainage.  LOS: 12 days    [name] 11/21/2012

## 2012-11-21 NOTE — Progress Notes (Signed)
Patient had episode of resp distress. Patient desat to 83% nurse at bedside assist patient to side of bed in sitting position, started nonrebreather per charge nurse assistance and patient sats improved . Patient having difficulty with bed positioning removed icu bed and  Patient in reg bed. More comfortable now on  at [place] will wean through night. sats are 98%

## 2012-11-21 NOTE — Progress Notes (Signed)
PT Cancellation Note  Patient Details Name: [name] MRN: [id number] DOB: [birth year]   Cancelled Treatment:    Reason Eval/Treat Not Completed: Medical issues which prohibited therapy Checked with pt at 28 AM and pt did not want to attempt OOB. This PM, appears pt is being discharged. If not dc'd, will f/u in AM.   Claretha Cooper 11/21/2012, 4:13 PM Tresa Endo PT [phone]

## 2012-11-21 NOTE — Progress Notes (Signed)
Would leave him on liquids for now.  Liquids x 1 week. Will remove drain tomorrow, if no more output. Disposition per primary team.  Imogene Burn. Georgette Dover, MD, [facility]  11/21/2012 12:36 PM

## 2012-11-21 NOTE — Plan of Care (Signed)
Problem: Phase I Progression Outcomes Goal: Voiding-avoid urinary catheter unless indicated Outcome: Completed/Met Date Met:  11/21/12 Patient is uses urinal

## 2012-11-22 ENCOUNTER — Other Ambulatory Visit (HOSPITAL_COMMUNITY): Payer: Medicare Other

## 2012-11-22 LAB — CK: Total CK: 135 U/L (ref 7–232)

## 2012-11-22 LAB — COMPREHENSIVE METABOLIC PANEL
ALT: 55 U/L — ABNORMAL HIGH (ref 0–53)
AST: 44 U/L — ABNORMAL HIGH (ref 0–37)
Albumin: 2.5 g/dL — ABNORMAL LOW (ref 3.5–5.2)
CO2: 29 mEq/L (ref 19–32)
Calcium: 9.1 mg/dL (ref 8.4–10.5)
Chloride: 95 mEq/L — ABNORMAL LOW (ref 96–112)
GFR calc non Af Amer: 54 mL/min — ABNORMAL LOW (ref >90)
Sodium: 139 mEq/L (ref 135–145)
Total Bilirubin: 0.6 mg/dL (ref 0.3–1.2)

## 2012-11-22 LAB — URINALYSIS, ROUTINE W REFLEX MICROSCOPIC
Leukocytes, UA: NEGATIVE
Nitrite: NEGATIVE
Specific Gravity, Urine: 1.026 (ref 1.005–1.030)
pH: 5.5 (ref 5.0–8.0)

## 2012-11-22 LAB — CBC WITH DIFFERENTIAL/PLATELET
Basophils Absolute: 0 10*3/uL (ref 0.0–0.1)
Basophils Relative: 0 % (ref 0–1)
Lymphocytes Relative: 11 % — ABNORMAL LOW (ref 12–46)
MCHC: 32.3 g/dL (ref 30.0–36.0)
Neutro Abs: 11.4 10*3/uL — ABNORMAL HIGH (ref 1.7–7.7)
Neutrophils Relative %: 75 % (ref 43–77)
Platelets: 463 10*3/uL — ABNORMAL HIGH (ref 150–400)
RDW: 14.1 % (ref 11.5–15.5)
WBC: 15.2 10*3/uL — ABNORMAL HIGH (ref 4.0–10.5)

## 2012-11-22 LAB — HEMOGLOBIN A1C
Hgb A1c MFr Bld: 6.8 % — ABNORMAL HIGH (ref <5.7)
Hgb A1c MFr Bld: 6.8 % — ABNORMAL HIGH (ref <5.7)
Mean Plasma Glucose: 148 mg/dL — ABNORMAL HIGH (ref <117)
Mean Plasma Glucose: 148 mg/dL — ABNORMAL HIGH (ref <117)

## 2012-11-22 LAB — PRO B NATRIURETIC PEPTIDE: Pro B Natriuretic peptide (BNP): 2557 pg/mL — ABNORMAL HIGH (ref 0–450)

## 2012-11-22 LAB — URINE MICROSCOPIC-ADD ON

## 2012-11-23 LAB — TSH: TSH: 1.76 u[IU]/mL (ref 0.350–4.500)

## 2012-11-23 NOTE — Progress Notes (Signed)
  Subjective: Pt doing well, having no severe pain.  Drain increased output since ambulating more.  They sent it for culture, but it appears serous.  >264mL output.  Tolerating diet, ambulating well.    Objective: PE: Gen:  Alert, NAD, pleasant Abd: Soft, NT/ND, +BS, no HSM, incisions C/D/I, drain with serous drainage   Lab Results:   Recent Labs  11/21/12 0335 11/22/12 1734  WBC 8.4 15.2*  HGB 10.3* 12.4*  HCT 31.9* 38.4*  PLT 343 463*   BMET  Recent Labs  11/22/12 1734  NA 139  K 3.2*  CL 95*  CO2 29  GLUCOSE 162*  BUN 15  CREATININE 1.23  CALCIUM 9.1   PT/INR No results found for this basename: LABPROT, INR,  in the last 72 hours CMP     Component Value Date/Time   NA 139 11/22/2012 1734   K 3.2* 11/22/2012 1734   CL 95* 11/22/2012 1734   CO2 29 11/22/2012 1734   GLUCOSE 162* 11/22/2012 1734   BUN 15 11/22/2012 1734   CREATININE 1.23 11/22/2012 1734   CALCIUM 9.1 11/22/2012 1734   PROT 6.8 11/22/2012 1734   ALBUMIN 2.5* 11/22/2012 1734   AST 44* 11/22/2012 1734   ALT 55* 11/22/2012 1734   ALKPHOS 108 11/22/2012 1734   BILITOT 0.6 11/22/2012 1734   GFRNONAA 54* 11/22/2012 1734   GFRAA 63* 11/22/2012 1734   Lipase  No results found for this basename: lipase       Studies/Results: Dg Chest Portable 1 View  11/22/2012  *RADIOLOGY REPORT*  Clinical Data: Respiratory failure  PORTABLE CHEST - 1 VIEW  Comparison: 11/12/2008  Findings:  Examination is degraded secondary to patient body habitus and portable technique.  Grossly unchanged enlarged cardiac silhouette and mediastinal contours with apparent difference is due to decreased lung volumes and AP projection.  Pulmonary vasculature is indistinct with cephalization of flow.  Suspected small bilateral pleural effusions with worsening bibasilar opacities, right greater than left.  No definite pneumothorax.  Grossly unchanged bones.  IMPRESSION: Degraded, hypoventilated examination with findings suggestive of edema and  small bilateral effusions and bibasilar opacities, right greater than left, atelectasis versus infiltrate.  Further evaluation with a PA and lateral chest radiograph may be obtained as clinically indicated.   Original Report Authenticated By: Jake Seats, MD      Assessment/Plan POD #6 s/p LAPAROSCOPY DIAGNOSTIC; Repair of gastric perforation, drain placement, 11/17/2012, [name], DO.  1.  Continue drain since >243mL out yesterday, talked to RN about more accurate measurement overnight so we can better evaluate tomorrow whether it can come out or not.  Will see tomorrow to check drain status.  Spondylosis with compression of L5 nerve root, L4-5 left. S/p Re-exploration of L4-5 interspace with lateral decompression nd decompression of the L5 nerve root. 11/16/2012, [name], MD  AODM  OBESITY Body mass index is 43.1  ARF Hypertension  PTSD ([place])      LOS: 2 days    Coralie Keens 11/23/2012, 9:35 AM Pager: [phone]

## 2012-11-23 NOTE — Progress Notes (Signed)
Seen together Patient examined and I agree with the assessment and plan  Georganna Skeans, MD, MPH, FACS Pager: [phone]  11/23/2012 4:06 PM

## 2012-11-24 LAB — BASIC METABOLIC PANEL
CO2: 34 mEq/L — ABNORMAL HIGH (ref 19–32)
GFR calc non Af Amer: 51 mL/min — ABNORMAL LOW (ref >90)
Glucose, Bld: 170 mg/dL — ABNORMAL HIGH (ref 70–99)
Potassium: 3.6 mEq/L (ref 3.5–5.1)
Sodium: 139 mEq/L (ref 135–145)

## 2012-11-24 LAB — PROCALCITONIN: Procalcitonin: <0.1 ng/mL

## 2012-11-24 LAB — WOUND CULTURE: Culture: NO GROWTH

## 2012-11-24 LAB — URINE CULTURE

## 2012-11-24 LAB — MAGNESIUM: Magnesium: 1.3 mg/dL — ABNORMAL LOW (ref 1.5–2.5)

## 2012-11-26 DIAGNOSIS — B9681 Helicobacter pylori [H. pylori] as the cause of diseases classified elsewhere: Secondary | ICD-10-CM | POA: Diagnosis present

## 2012-11-26 DIAGNOSIS — K2970 Gastritis, unspecified, without bleeding: Secondary | ICD-10-CM | POA: Diagnosis present

## 2012-11-26 DIAGNOSIS — K265 Chronic or unspecified duodenal ulcer with perforation: Secondary | ICD-10-CM | POA: Diagnosis present

## 2012-11-27 LAB — BASIC METABOLIC PANEL
CO2: 31 mEq/L (ref 19–32)
Chloride: 97 mEq/L (ref 96–112)
Creatinine, Ser: 1.35 mg/dL (ref 0.50–1.35)
GFR calc Af Amer: 56 mL/min — ABNORMAL LOW (ref >90)
Potassium: 3.1 mEq/L — ABNORMAL LOW (ref 3.5–5.1)
Sodium: 138 mEq/L (ref 135–145)

## 2012-11-27 LAB — CBC
MCV: 87.9 fL (ref 78.0–100.0)
Platelets: 466 10*3/uL — ABNORMAL HIGH (ref 150–400)
RBC: 3.87 MIL/uL — ABNORMAL LOW (ref 4.22–5.81)
RDW: 14.8 % (ref 11.5–15.5)
WBC: 11.7 10*3/uL — ABNORMAL HIGH (ref 4.0–10.5)

## 2012-11-27 LAB — MAGNESIUM: Magnesium: 1.5 mg/dL (ref 1.5–2.5)

## 2012-11-27 LAB — PRO B NATRIURETIC PEPTIDE: Pro B Natriuretic peptide (BNP): 144.5 pg/mL (ref 0–450)

## 2012-11-27 NOTE — Progress Notes (Signed)
Patient ID: [name], male   DOB: 1931/11/22, 79 y.o.   MRN: [id number] Pt. Awake. Ambulating in hall. No leg pain. Very little back pain.  BM's ok, voiding. Plan for staples to be removed by Nsg. Staff.  Plan to see in our office in 3-4 weeks.  Call [phone] for appt. Min drainage from dist. Wound. Orthopaedically stable.

## 2012-11-27 NOTE — Progress Notes (Signed)
  Echocardiogram 2D Echocardiogram has been performed.  [name] 11/27/2012, 2:19 PM

## 2012-11-28 LAB — CULTURE, BLOOD (ROUTINE X 2): Culture: NO GROWTH

## 2012-11-28 LAB — MAGNESIUM: Magnesium: 1.7 mg/dL (ref 1.5–2.5)

## 2012-11-29 LAB — BASIC METABOLIC PANEL
BUN: 31 mg/dL — ABNORMAL HIGH (ref 6–23)
Chloride: 95 mEq/L — ABNORMAL LOW (ref 96–112)
Creatinine, Ser: 1.67 mg/dL — ABNORMAL HIGH (ref 0.50–1.35)
GFR calc non Af Amer: 38 mL/min — ABNORMAL LOW (ref >90)
Glucose, Bld: 203 mg/dL — ABNORMAL HIGH (ref 70–99)
Potassium: 4.2 mEq/L (ref 3.5–5.1)

## 2012-11-29 LAB — CBC WITH DIFFERENTIAL/PLATELET
Basophils Absolute: 0 10*3/uL (ref 0.0–0.1)
Eosinophils Relative: 3 % (ref 0–5)
Lymphocytes Relative: 17 % (ref 12–46)
Lymphs Abs: 2.1 10*3/uL (ref 0.7–4.0)
Neutro Abs: 8.2 10*3/uL — ABNORMAL HIGH (ref 1.7–7.7)
Neutrophils Relative %: 69 % (ref 43–77)
Platelets: 438 10*3/uL — ABNORMAL HIGH (ref 150–400)
RBC: 3.54 MIL/uL — ABNORMAL LOW (ref 4.22–5.81)
RDW: 15.1 % (ref 11.5–15.5)
WBC: 11.9 10*3/uL — ABNORMAL HIGH (ref 4.0–10.5)

## 2012-11-29 LAB — COMPREHENSIVE METABOLIC PANEL
ALT: 21 U/L (ref 0–53)
AST: 20 U/L (ref 0–37)
Alkaline Phosphatase: 77 U/L (ref 39–117)
CO2: 27 mEq/L (ref 19–32)
Calcium: 8.7 mg/dL (ref 8.4–10.5)
GFR calc Af Amer: 38 mL/min — ABNORMAL LOW (ref >90)
GFR calc non Af Amer: 33 mL/min — ABNORMAL LOW (ref >90)
Glucose, Bld: 167 mg/dL — ABNORMAL HIGH (ref 70–99)
Potassium: 3.8 mEq/L (ref 3.5–5.1)
Sodium: 135 mEq/L (ref 135–145)

## 2012-12-20 ENCOUNTER — Encounter (INDEPENDENT_AMBULATORY_CARE_PROVIDER_SITE_OTHER): Payer: Self-pay | Admitting: General Surgery

## 2012-12-20 ENCOUNTER — Ambulatory Visit (INDEPENDENT_AMBULATORY_CARE_PROVIDER_SITE_OTHER): Payer: Medicare Other | Admitting: General Surgery

## 2012-12-20 VITALS — BP 138/70 | HR 80 | Resp 16 | Ht 71.0 in | Wt 286.0 lb

## 2012-12-20 DIAGNOSIS — Z4889 Encounter for other specified surgical aftercare: Secondary | ICD-10-CM

## 2012-12-20 DIAGNOSIS — Z5189 Encounter for other specified aftercare: Secondary | ICD-10-CM

## 2012-12-20 MED ORDER — PANTOPRAZOLE SODIUM 40 MG PO TBEC: 40.0000 mg | DELAYED_RELEASE_TABLET | Freq: Every day | ORAL | Status: DC

## 2012-12-20 MED ORDER — DOCUSATE SODIUM 100 MG PO CAPS: 100.0000 mg | ORAL_CAPSULE | Freq: Two times a day (BID) | ORAL | Status: DC | PRN

## 2012-12-20 NOTE — Progress Notes (Signed)
Subjective:     Patient ID: [name], male   DOB: [birth year], 79 y.o.   MRN: [id number]  HPI This patient follows up one month status post gastric perforation and laparoscopic repair of and drainage. He says that he has been doing very well and has not had any complaints with regard to his stomach. He says that he is not taking any antacids medications but has stopped taking his Toradol. His back has been doing well as well and he really has no complaints. He has been tolerating regular diet but has avoided any spicy foods  Review of Systems     Objective:   Physical Exam No distress and nontoxic-appearing sitting comfortably in a chair His abdomen is soft and nontender exam incisions are healing nicely without sign of infection    Assessment:     Status post arthroscopic repair of perforated gastric ulcer He is doing very well from this procedure and seems to have recovered fully. He is not taking any antacids medications and I recommended that he be on Protonix daily and continue to avoid NSAIDs. I also recommended that he undergo upper endoscopy in another 4 weeks to rule out any gastric malignancy.     Plan:     Protonix 40 mg daily and upper endoscopy. I will see him back in about one month after his endoscopy.  Otherwise he can increase his diet as tolerated.

## 2012-12-21 ENCOUNTER — Encounter: Payer: Self-pay | Admitting: Gastroenterology

## 2012-12-21 ENCOUNTER — Telehealth (INDEPENDENT_AMBULATORY_CARE_PROVIDER_SITE_OTHER): Payer: Self-pay | Admitting: General Surgery

## 2012-12-21 NOTE — Telephone Encounter (Signed)
Left message for patient he has appt with Dr Dalene Carrow group  On 01/14/13 @ 9am

## 2013-01-14 ENCOUNTER — Encounter: Payer: Self-pay | Admitting: Gastroenterology

## 2013-01-14 ENCOUNTER — Ambulatory Visit (INDEPENDENT_AMBULATORY_CARE_PROVIDER_SITE_OTHER): Payer: Medicare Other | Admitting: Gastroenterology

## 2013-01-14 VITALS — BP 138/76 | HR 72 | Ht 71.0 in | Wt 278.0 lb

## 2013-01-14 DIAGNOSIS — K259 Gastric ulcer, unspecified as acute or chronic, without hemorrhage or perforation: Secondary | ICD-10-CM
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
## 2013-01-14 NOTE — Patient Instructions (Addendum)
You will be set up for an upper endoscopy (LEC, moderate sedation) for your recent gastric ulcer.                                               We are excited to introduce MyChart, a new best-in-class service that provides you online access to important information in your electronic medical record. We want to make it easier for you to view your health information - all in one secure location - when and where you need it. We expect MyChart will enhance the quality of care and service we provide.  When you register for MyChart, you can:    View your test results.    Request appointments and receive appointment reminders via email.    Request medication renewals.    View your medical history, allergies, medications and immunizations.    Communicate with your physician's office through a password-protected site.    Conveniently print information such as your medication lists.  To find out if MyChart is right for you, please talk to a member of our clinical staff today. We will gladly answer your questions about this free health and wellness tool.  If you are age 77 or older and want a member of your family to have access to your record, you must provide written consent by completing a proxy form available at our office. Please speak to our clinical staff about guidelines regarding accounts for patients younger than age 30.  As you activate your MyChart account and need any technical assistance, please call the MyChart technical support line at [phone]-CHART [phone]) or email your question to [email or web address]. If you email your question(s), please include your name, a return phone number and the best time to reach you.  If you have non-urgent health-related questions, you can send a message to our office through [place] at [email or web address]. If you have a medical emergency, call 911.  Thank you for using MyChart as your new health and wellness resource!   MyChart  licensed from [employer],  1999-2010. Patents Pending.

## 2013-02-19 ENCOUNTER — Encounter: Payer: Self-pay | Admitting: Gastroenterology

## 2013-02-19 ENCOUNTER — Ambulatory Visit (AMBULATORY_SURGERY_CENTER): Payer: Medicare Other | Admitting: Gastroenterology

## 2013-02-19 VITALS — BP 130/82 | HR 79 | Temp 96.9°F | Resp 17 | Ht 71.0 in | Wt 278.0 lb

## 2013-02-19 DIAGNOSIS — K2990 Gastroduodenitis, unspecified, without bleeding: Secondary | ICD-10-CM

## 2013-02-19 DIAGNOSIS — K2960 Other gastritis without bleeding: Secondary | ICD-10-CM

## 2013-02-19 DIAGNOSIS — K259 Gastric ulcer, unspecified as acute or chronic, without hemorrhage or perforation: Secondary | ICD-10-CM

## 2013-02-19 LAB — GLUCOSE, CAPILLARY
Glucose-Capillary: 238 mg/dL — ABNORMAL HIGH (ref 70–99)
Glucose-Capillary: 234 mg/dL — ABNORMAL HIGH (ref 70–99)

## 2013-02-19 MED ORDER — SODIUM CHLORIDE 0.9 % IV SOLN: 500.0000 mL | INTRAVENOUS | Status: DC

## 2013-02-19 NOTE — Progress Notes (Signed)
Patient did not experience any of the following events: a burn prior to discharge; a fall within the facility; wrong site/side/patient/procedure/implant event; or a hospital transfer or hospital admission upon discharge from the facility. (G8907) Patient did not have preoperative order for IV antibiotic SSI prophylaxis. (G8918)  

## 2013-02-19 NOTE — Op Note (Signed)
[facility]  [address] [place], [id number]   ENDOSCOPY PROCEDURE REPORT  PATIENT: [name], Justin.  MR#: [id number] BIRTHDATE: [birth year] , 80  yrs. old GENDER: Male ENDOSCOPIST: Milus Banister, MD REFERRED BY:  [name], D.O. PROCEDURE DATE:  02/19/2013 PROCEDURE:  EGD w/ biopsy ASA CLASS:     Class III INDICATIONS:  Perforated gastric Ulcer 10/2012 Dr.  Lilyan Punt: s/p laparoscopic abdominal washout and laparoscopic repair of gastric perforation and drain placement and EGD per surgery on 11/17/2012: He presented with with acute onset of abdominal pain and bloating today while he was in the hospital 2 days after spinal diskectomy. CT scan with free air and upper abdominal tenderness, and hematemesis.Marland Kitchen MEDICATIONS: Fentanyl 50 mcg IV, Versed 4 mg IV, and These medications were titrated to patient response per physician's verbal order TOPICAL ANESTHETIC: Cetacaine Spray  DESCRIPTION OF PROCEDURE: After the risks benefits and alternatives of the procedure were thoroughly explained, informed consent was obtained.  The LB [id number] endoscope was introduced through the mouth and advanced to the second portion of the duodenum. Without limitations.  The instrument was slowly withdrawn as the mucosa was fully examined.    There was moderate, pan-gastritis.  This was biopsied and sent to pathology.  No luminal evidence of previous gastric ulcer perforation.  The examination was otherwise normal.  Retroflexed views revealed no abnormalities.     The scope was then withdrawn from the patient and the procedure completed.  COMPLICATIONS: There were no complications. ENDOSCOPIC IMPRESSION: There was moderate, pan-gastritis.  This was biopsied and sent to pathology.  No luminal evidence of previous gastric ulcer perforation.  The examination was otherwise normal.  RECOMMENDATIONS: Continue to avoid NSAID type pain medicines.  If biopsies show H. pylori,  you will be started on appropriate antibiotics.   eSigned:  Milus Banister, MD 02/19/2013 11:48 AM

## 2013-02-19 NOTE — Patient Instructions (Addendum)
Discharge instructions given with verbal understanding. Handout on gastritis given. Resume previous medications. YOU HAD AN ENDOSCOPIC PROCEDURE TODAY AT [facility]: Refer to the procedure report that was given to you for any specific questions about what was found during the examination.  If the procedure report does not answer your questions, please call your gastroenterologist to clarify.  If you requested that your care partner not be given the details of your procedure findings, then the procedure report has been included in a sealed envelope for you to review at your convenience later.  YOU SHOULD EXPECT: Some feelings of bloating in the abdomen. Passage of more gas than usual.  Walking can help get rid of the air that was put into your GI tract during the procedure and reduce the bloating. If you had a lower endoscopy (such as a colonoscopy or flexible sigmoidoscopy) you may notice spotting of blood in your stool or on the toilet paper. If you underwent a bowel prep for your procedure, then you may not have a normal bowel movement for a few days.  DIET: Your first meal following the procedure should be a light meal and then it is ok to progress to your normal diet.  A half-sandwich or bowl of soup is an example of a good first meal.  Heavy or fried foods are harder to digest and may make you feel nauseous or bloated.  Likewise meals heavy in dairy and vegetables can cause extra gas to form and this can also increase the bloating.  Drink plenty of fluids but you should avoid alcoholic beverages for 24 hours.  ACTIVITY: Your care partner should take you home directly after the procedure.  You should plan to take it easy, moving slowly for the rest of the day.  You can resume normal activity the day after the procedure however you should NOT DRIVE or use heavy machinery for 24 hours (because of the sedation medicines used during the test).    SYMPTOMS TO REPORT IMMEDIATELY: A  gastroenterologist can be reached at any hour.  During normal business hours, 8:30 AM to 5:00 PM Monday through Friday, call [phone].  After hours and on weekends, please call the GI answering service at [phone] who will take a message and have the physician on call contact you.   Following upper endoscopy (EGD)  Vomiting of blood or coffee ground material  New chest pain or pain under the shoulder blades  Painful or persistently difficult swallowing  New shortness of breath  Fever of 100F or higher  Black, tarry-looking stools  FOLLOW UP: If any biopsies were taken you will be contacted by phone or by letter within the next 1-3 weeks.  Call your gastroenterologist if you have not heard about the biopsies in 3 weeks.  Our staff will call the home number listed on your records the next business day following your procedure to check on you and address any questions or concerns that you may have at that time regarding the information given to you following your procedure. This is a courtesy call and so if there is no answer at the home number and we have not heard from you through the emergency physician on call, we will assume that you have returned to your regular daily activities without incident.  SIGNATURES/CONFIDENTIALITY: You and/or your care partner have signed paperwork which will be entered into your electronic medical record.  These signatures attest to the fact that that the information above on  your After Visit Summary has been reviewed and is understood.  Full responsibility of the confidentiality of this discharge information lies with you and/or your care-partner. 

## 2013-02-20 ENCOUNTER — Telehealth: Payer: Self-pay

## 2013-02-20 NOTE — Telephone Encounter (Signed)
  Follow up Call-  Call back number 02/19/2013  Post procedure Call Back phone  # [phone]  Permission to leave phone message Yes     Patient questions:  Do you have a fever, pain , or abdominal swelling? no Pain Score  0 *  Have you tolerated food without any problems? yes  Have you been able to return to your normal activities? yes  Do you have any questions about your discharge instructions: Diet   no Medications  no Follow up visit  no  Do you have questions or concerns about your Care? no  Actions: * If pain score is 4 or above: No action needed, pain <4.

## 2013-02-26 ENCOUNTER — Encounter: Payer: Self-pay | Admitting: Gastroenterology

## 2014-01-15 ENCOUNTER — Encounter (HOSPITAL_BASED_OUTPATIENT_CLINIC_OR_DEPARTMENT_OTHER): Payer: Self-pay | Admitting: *Deleted

## 2014-01-15 NOTE — Progress Notes (Signed)
01/15/14 1518  OBSTRUCTIVE SLEEP APNEA  Have you ever been diagnosed with sleep apnea through a sleep study? No  Do you snore loudly (loud enough to be heard through closed doors)?  0  Do you often feel tired, fatigued, or sleepy during the daytime? 0  Has anyone observed you stop breathing during your sleep? 0  Do you have, or are you being treated for high blood pressure? 1  BMI more than 35 kg/m2? 1  Age over 78 years old? 1  Neck circumference greater than 40 cm/16 inches? 1  Gender: 1  Obstructive Sleep Apnea Score 5  Score 4 or greater  Results sent to PCP

## 2014-01-15 NOTE — Progress Notes (Signed)
Pt denies cardiac problems-will come in for bmet=ekg-

## 2014-01-16 ENCOUNTER — Other Ambulatory Visit: Payer: Self-pay

## 2014-01-16 ENCOUNTER — Encounter (HOSPITAL_BASED_OUTPATIENT_CLINIC_OR_DEPARTMENT_OTHER)
Admission: RE | Admit: 2014-01-16 | Discharge: 2014-01-16 | Disposition: A | Discharge status: Home / Self Care | Payer: Medicare Other | Source: Ambulatory Visit | Attending: Orthopedic Surgery | Admitting: Orthopedic Surgery

## 2014-01-16 DIAGNOSIS — Z01810 Encounter for preprocedural cardiovascular examination: Secondary | ICD-10-CM | POA: Insufficient documentation

## 2014-01-16 LAB — BASIC METABOLIC PANEL
BUN: 47 mg/dL — ABNORMAL HIGH (ref 6–23)
CO2: 26 meq/L (ref 19–32)
Calcium: 8.9 mg/dL (ref 8.4–10.5)
Chloride: 103 mEq/L (ref 96–112)
Creatinine, Ser: 1.8 mg/dL — ABNORMAL HIGH (ref 0.50–1.35)
GFR calc Af Amer: 40 mL/min — ABNORMAL LOW (ref >90)
GFR calc non Af Amer: 34 mL/min — ABNORMAL LOW (ref >90)
GLUCOSE: 207 mg/dL — AB (ref 70–99)
Potassium: 5 mEq/L (ref 3.7–5.3)
Sodium: 141 mEq/L (ref 137–147)

## 2014-01-16 NOTE — Progress Notes (Signed)
Bun /creat elevated-has been-documented elevated creat in hx since 3/14-reviewed with dr Dian Situ

## 2014-01-17 ENCOUNTER — Other Ambulatory Visit: Payer: Self-pay | Admitting: Orthopedic Surgery
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
## 2014-01-21 ENCOUNTER — Ambulatory Visit (HOSPITAL_BASED_OUTPATIENT_CLINIC_OR_DEPARTMENT_OTHER)
Admission: RE | Admit: 2014-01-21 | Discharge: 2014-01-21 | Disposition: A | Discharge status: Home / Self Care | Payer: Medicare Other | Source: Ambulatory Visit | Attending: Orthopedic Surgery | Admitting: Orthopedic Surgery

## 2014-01-21 ENCOUNTER — Encounter (HOSPITAL_BASED_OUTPATIENT_CLINIC_OR_DEPARTMENT_OTHER): Payer: Medicare Other | Admitting: Anesthesiology

## 2014-01-21 ENCOUNTER — Ambulatory Visit (HOSPITAL_BASED_OUTPATIENT_CLINIC_OR_DEPARTMENT_OTHER): Payer: Medicare Other | Admitting: Anesthesiology

## 2014-01-21 ENCOUNTER — Encounter (HOSPITAL_BASED_OUTPATIENT_CLINIC_OR_DEPARTMENT_OTHER)
Admission: RE | Disposition: A | Discharge status: Home / Self Care | Payer: Self-pay | Source: Ambulatory Visit | Attending: Orthopedic Surgery

## 2014-01-21 ENCOUNTER — Encounter (HOSPITAL_BASED_OUTPATIENT_CLINIC_OR_DEPARTMENT_OTHER): Payer: Self-pay | Admitting: Orthopedic Surgery

## 2014-01-21 DIAGNOSIS — M6530 Trigger finger, unspecified finger: Secondary | ICD-10-CM | POA: Insufficient documentation

## 2014-01-21 DIAGNOSIS — K219 Gastro-esophageal reflux disease without esophagitis: Secondary | ICD-10-CM | POA: Insufficient documentation

## 2014-01-21 DIAGNOSIS — I10 Essential (primary) hypertension: Secondary | ICD-10-CM | POA: Insufficient documentation

## 2014-01-21 DIAGNOSIS — E669 Obesity, unspecified: Secondary | ICD-10-CM | POA: Insufficient documentation

## 2014-01-21 DIAGNOSIS — E7800 Pure hypercholesterolemia, unspecified: Secondary | ICD-10-CM | POA: Insufficient documentation

## 2014-01-21 DIAGNOSIS — Z6841 Body Mass Index (BMI) 40.0 and over, adult: Secondary | ICD-10-CM | POA: Insufficient documentation

## 2014-01-21 DIAGNOSIS — E119 Type 2 diabetes mellitus without complications: Secondary | ICD-10-CM | POA: Insufficient documentation

## 2014-01-21 HISTORY — PX: TRIGGER FINGER RELEASE: SHX641

## 2014-01-21 HISTORY — DX: Presence of external hearing-aid: Z97.4

## 2014-01-21 HISTORY — DX: Dorsalgia, unspecified: M54.9

## 2014-01-21 HISTORY — DX: Presence of dental prosthetic device (complete) (partial): Z97.2

## 2014-01-21 HISTORY — DX: Gastro-esophageal reflux disease without esophagitis: K21.9

## 2014-01-21 HISTORY — DX: Other chronic pain: G89.29

## 2014-01-21 HISTORY — DX: Unspecified osteoarthritis, unspecified site: M19.90

## 2014-01-21 HISTORY — DX: Complete loss of teeth, unspecified cause, unspecified class: K08.109

## 2014-01-21 HISTORY — DX: Presence of spectacles and contact lenses: Z97.3

## 2014-01-21 LAB — GLUCOSE, CAPILLARY
GLUCOSE-CAPILLARY: 94 mg/dL (ref 70–99)
Glucose-Capillary: 117 mg/dL — ABNORMAL HIGH (ref 70–99)

## 2014-01-21 LAB — POCT HEMOGLOBIN-HEMACUE: Hemoglobin: 14.3 g/dL (ref 13.0–17.0)

## 2014-01-21 SURGERY — RELEASE, A1 PULLEY, FOR TRIGGER FINGER
Anesthesia: Monitor Anesthesia Care | Site: Finger | Laterality: Left

## 2014-01-21 MED ORDER — OXYCODONE HCL ER 10 MG PO T12A: 10.0000 mg | EXTENDED_RELEASE_TABLET | Freq: Two times a day (BID) | ORAL | Status: DC

## 2014-01-21 MED ORDER — MIDAZOLAM HCL 5 MG/5ML IJ SOLN
INTRAMUSCULAR | Status: DC | PRN
  Administered 2014-01-21: 2 mg via INTRAVENOUS

## 2014-01-21 MED ORDER — LIDOCAINE HCL 2 % IJ SOLN
INTRAMUSCULAR | Status: DC | PRN
  Administered 2014-01-21: 2.5 mL

## 2014-01-21 MED ORDER — FENTANYL CITRATE 0.05 MG/ML IJ SOLN
INTRAMUSCULAR | Status: DC | PRN
  Administered 2014-01-21: 25 ug via INTRAVENOUS

## 2014-01-21 MED ORDER — MIDAZOLAM HCL 2 MG/2ML IJ SOLN
INTRAMUSCULAR | Status: AC
  Filled 2014-01-21: qty 2

## 2014-01-21 MED ORDER — OXYCODONE HCL 5 MG/5ML PO SOLN: 5.0000 mg | Freq: Once | ORAL | Status: DC | PRN

## 2014-01-21 MED ORDER — FENTANYL CITRATE 0.05 MG/ML IJ SOLN: 50.0000 ug | INTRAMUSCULAR | Status: DC | PRN

## 2014-01-21 MED ORDER — LIDOCAINE HCL 1 % IJ SOLN
INTRAMUSCULAR | Status: DC | PRN
  Administered 2014-01-21: 20 mg via INTRADERMAL

## 2014-01-21 MED ORDER — PROPOFOL 10 MG/ML IV BOLUS
INTRAVENOUS | Status: DC | PRN
  Administered 2014-01-21 (×2): 30 mg via INTRAVENOUS

## 2014-01-21 MED ORDER — CHLORHEXIDINE GLUCONATE 4 % EX LIQD
60.0000 mL | Freq: Once | CUTANEOUS | Status: DC
Start: 2014-01-21 — End: 2014-01-21

## 2014-01-21 MED ORDER — LIDOCAINE HCL 2 % IJ SOLN
INTRAMUSCULAR | Status: AC
  Filled 2014-01-21: qty 20

## 2014-01-21 MED ORDER — OXYCODONE HCL 5 MG PO TABS: 5.0000 mg | ORAL_TABLET | Freq: Once | ORAL | Status: DC | PRN

## 2014-01-21 MED ORDER — LACTATED RINGERS IV SOLN
INTRAVENOUS | Status: DC
  Administered 2014-01-21: 09:00:00 via INTRAVENOUS

## 2014-01-21 MED ORDER — METHYLPREDNISOLONE ACETATE 40 MG/ML IJ SUSP
INTRAMUSCULAR | Status: AC
  Filled 2014-01-21: qty 1

## 2014-01-21 MED ORDER — FENTANYL CITRATE 0.05 MG/ML IJ SOLN
INTRAMUSCULAR | Status: AC
  Filled 2014-01-21: qty 4

## 2014-01-21 MED ORDER — ONDANSETRON HCL 4 MG/2ML IJ SOLN
INTRAMUSCULAR | Status: DC | PRN
  Administered 2014-01-21: 4 mg via INTRAVENOUS

## 2014-01-21 MED ORDER — HYDROMORPHONE HCL PF 1 MG/ML IJ SOLN: 0.2500 mg | INTRAMUSCULAR | Status: DC | PRN

## 2014-01-21 MED ORDER — MIDAZOLAM HCL 2 MG/2ML IJ SOLN: 1.0000 mg | INTRAMUSCULAR | Status: DC | PRN

## 2014-01-21 SURGICAL SUPPLY — 42 items
BANDAGE ADH SHEER 1  50/CT (GAUZE/BANDAGES/DRESSINGS) IMPLANT
BANDAGE COBAN STERILE 2 (GAUZE/BANDAGES/DRESSINGS) ×3 IMPLANT
BLADE 15 SAFETY STRL DISP (BLADE) ×3 IMPLANT
BNDG CMPR 9X4 STRL LF SNTH (GAUZE/BANDAGES/DRESSINGS) ×1
BNDG COHESIVE 3X5 TAN STRL LF (GAUZE/BANDAGES/DRESSINGS) ×2 IMPLANT
BNDG ESMARK 4X9 LF (GAUZE/BANDAGES/DRESSINGS) ×2 IMPLANT
BRUSH SCRUB EZ PLAIN DRY (MISCELLANEOUS) ×3 IMPLANT
CLOSURE WOUND 1/2 X4 (GAUZE/BANDAGES/DRESSINGS) ×1
CORDS BIPOLAR (ELECTRODE) IMPLANT
COVER MAYO STAND STRL (DRAPES) ×3 IMPLANT
COVER TABLE BACK 60X90 (DRAPES) ×3 IMPLANT
CUFF TOURNIQUET SINGLE 18IN (TOURNIQUET CUFF) IMPLANT
DECANTER SPIKE VIAL GLASS SM (MISCELLANEOUS) ×2 IMPLANT
DRAPE EXTREMITY T 121X128X90 (DRAPE) ×3 IMPLANT
DRAPE SURG 17X23 STRL (DRAPES) ×3 IMPLANT
GAUZE SPONGE 4X4 12PLY STRL (GAUZE/BANDAGES/DRESSINGS) ×3 IMPLANT
GLOVE BIO SURGEON STRL SZ 6.5 (GLOVE) ×1 IMPLANT
GLOVE BIO SURGEONS STRL SZ 6.5 (GLOVE) ×1
GLOVE BIOGEL M STRL SZ7.5 (GLOVE) ×3 IMPLANT
GLOVE BIOGEL PI IND STRL 7.0 (GLOVE) IMPLANT
GLOVE BIOGEL PI INDICATOR 7.0 (GLOVE) ×2
GLOVE ORTHO TXT STRL SZ7.5 (GLOVE) ×3 IMPLANT
GOWN STRL REUS W/ TWL LRG LVL3 (GOWN DISPOSABLE) ×1 IMPLANT
GOWN STRL REUS W/ TWL XL LVL3 (GOWN DISPOSABLE) ×2 IMPLANT
GOWN STRL REUS W/TWL LRG LVL3 (GOWN DISPOSABLE) ×3
GOWN STRL REUS W/TWL XL LVL3 (GOWN DISPOSABLE) ×6
NEEDLE 27GAX1X1/2 (NEEDLE) IMPLANT
PACK BASIN DAY SURGERY FS (CUSTOM PROCEDURE TRAY) ×3 IMPLANT
PADDING CAST ABS 4INX4YD NS (CAST SUPPLIES) ×2
PADDING CAST ABS COTTON 4X4 ST (CAST SUPPLIES) ×1 IMPLANT
SPONGE GAUZE 4X4 12PLY STER LF (GAUZE/BANDAGES/DRESSINGS) ×2 IMPLANT
STOCKINETTE 4X48 STRL (DRAPES) ×3 IMPLANT
STRIP CLOSURE SKIN 1/2X4 (GAUZE/BANDAGES/DRESSINGS) ×1 IMPLANT
SUT ETHILON 5 0 P 3 18 (SUTURE) ×2
SUT NYLON ETHILON 5-0 P-3 1X18 (SUTURE) IMPLANT
SUT PROLENE 3 0 PS 2 (SUTURE) IMPLANT
SUT PROLENE 4 0 P 3 18 (SUTURE) IMPLANT
SYR 3ML 23GX1 SAFETY (SYRINGE) IMPLANT
SYR CONTROL 10ML LL (SYRINGE) ×2 IMPLANT
TOWEL OR 17X24 6PK STRL BLUE (TOWEL DISPOSABLE) ×3 IMPLANT
TRAY DSU PREP LF (CUSTOM PROCEDURE TRAY) ×3 IMPLANT
UNDERPAD 30X30 INCONTINENT (UNDERPADS AND DIAPERS) ×3 IMPLANT

## 2014-01-21 NOTE — Anesthesia Postprocedure Evaluation (Signed)
  Anesthesia Post-op Note  Patient: [name]  Procedure(s) Performed: Procedure(s): RELEASE A PULLEY LEFT RING FRING (Left)  Patient Location: PACU  Anesthesia Type: MAC  Level of Consciousness: awake and alert   Airway and Oxygen Therapy: Patient Spontanous Breathing  Post-op Pain: none  Post-op Assessment: Post-op Vital signs reviewed, Patient's Cardiovascular Status Stable and Respiratory Function Stable  Post-op Vital Signs: Reviewed  Filed Vitals:   01/21/14 1015  BP: 103/58  Pulse: 64  Temp:   Resp: 22    Complications: No apparent anesthesia complications

## 2014-01-21 NOTE — Transfer of Care (Signed)
Immediate Anesthesia Transfer of Care Note  Patient: [name]  Procedure(s) Performed: Procedure(s): RELEASE A PULLEY LEFT RING FRING (Left)  Patient Location: PACU  Anesthesia Type:MAC  Level of Consciousness: awake, alert , oriented and patient cooperative  Airway & Oxygen Therapy: Patient Spontanous Breathing and Patient connected to face mask oxygen  Post-op Assessment: Report given to PACU RN and Post -op Vital signs reviewed and stable  Post vital signs: Reviewed and stable  Complications: No apparent anesthesia complications

## 2014-01-21 NOTE — Anesthesia Preprocedure Evaluation (Signed)
Anesthesia Evaluation  Patient identified by MRN, date of birth, ID band Patient awake    Reviewed: Allergy & Precautions, H&P , NPO status , Patient's Chart, lab work & pertinent test results, reviewed documented beta blocker date and time   Airway Mallampati: II TM Distance: >3 FB Neck ROM: Full    Dental no notable dental hx. (+) Lower Dentures, Upper Dentures, Dental Advisory Given   Pulmonary neg pulmonary ROS,  breath sounds clear to auscultation  Pulmonary exam normal       Cardiovascular hypertension, On Medications and On Home Beta Blockers Rhythm:Regular Rate:Normal     Neuro/Psych PTSDnegative neurological ROS     GI/Hepatic Neg liver ROS, GERD-  Medicated and Controlled,  Endo/Other  diabetes, Well Controlled, Type 1, Insulin DependentMorbid obesity  Renal/GU negative Renal ROS  negative genitourinary   Musculoskeletal   Abdominal   Peds  Hematology negative hematology ROS (+)   Anesthesia Other Findings   Reproductive/Obstetrics negative OB ROS                           Anesthesia Physical Anesthesia Plan  ASA: III  Anesthesia Plan: MAC   Post-op Pain Management:    Induction: Intravenous  Airway Management Planned: Simple Face Mask  Additional Equipment:   Intra-op Plan:   Post-operative Plan:   Informed Consent: I have reviewed the patients History and Physical, chart, labs and discussed the procedure including the risks, benefits and alternatives for the proposed anesthesia with the patient or authorized representative who has indicated his/her understanding and acceptance.   Dental advisory given  Plan Discussed with: CRNA  Anesthesia Plan Comments:         Anesthesia Quick Evaluation

## 2014-01-21 NOTE — Brief Op Note (Signed)
01/21/2014  10:00 AM  PATIENT:  [name]  80 y.o. male  PRE-OPERATIVE DIAGNOSIS:  STENOSING TENOSYNOVITIS LEFT RING FINGER  POST-OPERATIVE DIAGNOSIS:  STENOSING TENOSYNOVITIS LEFT RING FINGER  PROCEDURE:  Procedure(s): RELEASE A PULLEY LEFT RING FRING (Left)  SURGEON:  Surgeon(s) and Role:    * [name]., MD - Primary  PHYSICIAN ASSISTANT:   ASSISTANTS: nurse  ANESTHESIA:   MAC  EBL:  Total I/O In: 700 [I.V.:700] Out: -   BLOOD ADMINISTERED:none  DRAINS: none   LOCAL MEDICATIONS USED:  LIDOCAINE   SPECIMEN:  No Specimen  DISPOSITION OF SPECIMEN:  N/A  COUNTS:  YES  TOURNIQUET:   Total Tourniquet Time Documented: Upper Arm (Left) - 10 minutes Total: Upper Arm (Left) - 10 minutes   DICTATION: .Other Dictation: Dictation Number [id number]  PLAN OF CARE: Discharge to home after PACU  PATIENT DISPOSITION:  PACU - hemodynamically stable.   Delay start of Pharmacological VTE agent (>24hrs) due to surgical blood loss or risk of bleeding: not applicable

## 2014-01-21 NOTE — Anesthesia Procedure Notes (Signed)
Procedure Name: MAC Date/Time: 01/21/2014 9:35 AM Performed by: Marrianne Mood Pre-anesthesia Checklist: Patient identified, Timeout performed, Emergency Drugs available, Suction available and Patient being monitored Oxygen Delivery Method: Simple face mask Comments: Spontaneous respirations maintained throughout

## 2014-01-21 NOTE — Op Note (Signed)
NAME:  [name], [name]               ACCOUNT NO.:  [id number]  MEDICAL RECORD NO.:  [id number]  LOCATION:                                 FACILITY:  PHYSICIAN:  Justin Mighty. Griselda Tosh, M.D.      DATE OF BIRTH:  DATE OF PROCEDURE:  01/21/2014 DATE OF DISCHARGE:                              OPERATIVE REPORT   PREOPERATIVE DIAGNOSES:  Locking stenosing tenosynovitis of left ring finger with background medical problems of diabetes, significant osteoarthritis, hypertension, and obesity.  POSTOPERATIVE DIAGNOSES:  Locking stenosing tenosynovitis of left ring finger with background medical problems of diabetes, significant osteoarthritis, hypertension, and obesity.  OPERATIONS: 1. Release of left ring finger A1 pulley. 2. Debridement of superficialis tendon.  OPERATING SURGEON:  Justin Mighty. Rogue Rafalski, M.D.  ASSISTANT:  Nurse.  ANESTHESIA:  2% lidocaine flexor sheath block and palm block, left hand, supplemented by IV sedation.  SUPERVISING ANESTHESIOLOGIST:  Soledad Gerlach, MD.  INDICATIONS:  [name] is a 80 year old [place] veteran, who has a history of significant stenosing tenosynovitis of his left ring and prior history of stenosing tenosynovitis of his left long finger.  He is status post prior release of left long finger A1 pulley.  [name] has type 2 diabetes.  He has failed nonoperative measures including steroid injection.  Due to failure to respond, he was brought to the operating room at this time for release of his left ring finger A1 pulley.  Preoperatively in the holding area, he was reminded of the potential risks and benefits of surgery.  The procedure aftercare, potential risks and benefits were detailed.  Questions were invited and answered.  His left ring finger and hand were marked per protocol with a marking pen as the proper surgical site.  Questions were invited and answered in detail.  DESCRIPTION OF PROCEDURE:  [name] was brought to  room 6 of the [facility] and placed supine position on the operating table. Following light sedation, the left hand and finger were prepped with Betadine and 2.5 mL of 2% lidocaine without epinephrine were infiltrated into the flexor sheath of the left ring finger and around the path of the intended incision near the distal palmar crease.  After 5 minutes, excellent anesthesia was achieved.  The left hand and arm were then prepped with Betadine soap and solution, sterilely draped. A pneumatic tourniquet was applied to the proximal left brachium.  Following exsanguination of the left arm with Esmarch bandage, arterial tourniquet inflated to 220 mmHg.  Following routine surgical time-out, procedure commenced with placement of [name] hand in a lead hand to control finger position.  An oblique incision was fashioned directly over the palpably thickened A1 pulley.  There was noted to be a myxoid cyst on the pulley that was excised with a rongeur.  The A1 pulley and a small A0 pulley were released.  The superficialis profundus tendons were delivered.  The superficialis tendon was partially necrotic with about 15% necrosis of the fibers just proximal to the A1 pulley.  This was not at the site of steroid injection, but rather significantly proximally.  The tendon was delivered and cleared of necrotic tissue with micro  rongeur and scissors dissection.  The profundus tendon was delivered and found to be normal.  [name] then demonstrated full active range of motion of his finger without any residual triggering.  The wound was inspected for bleeding points which were electrocauterized with bipolar current followed by repair of the skin with intradermal 3-0 Prolene suture.  A compressive dressing was applied with Steri-Strips, sterile gauze, and Coban.  There were no intraoperative complications.     [name], M.D.     RVS/MEDQ  D:  01/21/2014  T:   01/21/2014  Job:  [id number]

## 2014-01-21 NOTE — Op Note (Signed)
[id number] 

## 2014-01-21 NOTE — Discharge Instructions (Addendum)

## 2014-01-23 ENCOUNTER — Encounter (HOSPITAL_BASED_OUTPATIENT_CLINIC_OR_DEPARTMENT_OTHER): Payer: Self-pay | Admitting: Orthopedic Surgery

## 2014-10-24 DIAGNOSIS — J40 Bronchitis, not specified as acute or chronic: Secondary | ICD-10-CM | POA: Diagnosis not present

## 2014-12-06 DIAGNOSIS — R197 Diarrhea, unspecified: Secondary | ICD-10-CM | POA: Diagnosis not present

## 2014-12-06 DIAGNOSIS — K921 Melena: Secondary | ICD-10-CM | POA: Diagnosis not present

## 2014-12-22 DIAGNOSIS — Z794 Long term (current) use of insulin: Secondary | ICD-10-CM | POA: Diagnosis not present

## 2014-12-22 DIAGNOSIS — E780 Pure hypercholesterolemia: Secondary | ICD-10-CM | POA: Diagnosis not present

## 2014-12-22 DIAGNOSIS — H6091 Unspecified otitis externa, right ear: Secondary | ICD-10-CM | POA: Diagnosis not present

## 2014-12-22 DIAGNOSIS — E1142 Type 2 diabetes mellitus with diabetic polyneuropathy: Secondary | ICD-10-CM | POA: Diagnosis not present

## 2014-12-22 DIAGNOSIS — E538 Deficiency of other specified B group vitamins: Secondary | ICD-10-CM | POA: Diagnosis not present

## 2014-12-22 DIAGNOSIS — E1149 Type 2 diabetes mellitus with other diabetic neurological complication: Secondary | ICD-10-CM | POA: Diagnosis not present

## 2014-12-22 DIAGNOSIS — I10 Essential (primary) hypertension: Secondary | ICD-10-CM | POA: Diagnosis not present

## 2014-12-22 DIAGNOSIS — K219 Gastro-esophageal reflux disease without esophagitis: Secondary | ICD-10-CM | POA: Diagnosis not present

## 2014-12-22 DIAGNOSIS — E559 Vitamin D deficiency, unspecified: Secondary | ICD-10-CM | POA: Diagnosis not present

## 2014-12-22 DIAGNOSIS — Z6841 Body Mass Index (BMI) 40.0 and over, adult: Secondary | ICD-10-CM | POA: Diagnosis not present

## 2015-01-31 ENCOUNTER — Emergency Department (HOSPITAL_COMMUNITY)
Admission: EM | Admit: 2015-01-31 | Discharge: 2015-01-31 | Disposition: A | Discharge status: Home / Self Care | Payer: Medicare Other | Attending: Emergency Medicine | Admitting: Emergency Medicine

## 2015-01-31 ENCOUNTER — Emergency Department (HOSPITAL_COMMUNITY): Payer: Medicare Other

## 2015-01-31 ENCOUNTER — Encounter (HOSPITAL_COMMUNITY): Payer: Self-pay | Admitting: Emergency Medicine

## 2015-01-31 DIAGNOSIS — G8929 Other chronic pain: Secondary | ICD-10-CM | POA: Insufficient documentation

## 2015-01-31 DIAGNOSIS — M1990 Unspecified osteoarthritis, unspecified site: Secondary | ICD-10-CM | POA: Insufficient documentation

## 2015-01-31 DIAGNOSIS — K219 Gastro-esophageal reflux disease without esophagitis: Secondary | ICD-10-CM | POA: Diagnosis not present

## 2015-01-31 DIAGNOSIS — W010XXA Fall on same level from slipping, tripping and stumbling without subsequent striking against object, initial encounter: Secondary | ICD-10-CM | POA: Insufficient documentation

## 2015-01-31 DIAGNOSIS — Z794 Long term (current) use of insulin: Secondary | ICD-10-CM | POA: Diagnosis not present

## 2015-01-31 DIAGNOSIS — S0990XA Unspecified injury of head, initial encounter: Secondary | ICD-10-CM | POA: Diagnosis not present

## 2015-01-31 DIAGNOSIS — E119 Type 2 diabetes mellitus without complications: Secondary | ICD-10-CM | POA: Insufficient documentation

## 2015-01-31 DIAGNOSIS — E780 Pure hypercholesterolemia: Secondary | ICD-10-CM | POA: Diagnosis not present

## 2015-01-31 DIAGNOSIS — Z974 Presence of external hearing-aid: Secondary | ICD-10-CM | POA: Insufficient documentation

## 2015-01-31 DIAGNOSIS — F4310 Post-traumatic stress disorder, unspecified: Secondary | ICD-10-CM | POA: Diagnosis not present

## 2015-01-31 DIAGNOSIS — Z79899 Other long term (current) drug therapy: Secondary | ICD-10-CM | POA: Insufficient documentation

## 2015-01-31 DIAGNOSIS — Y998 Other external cause status: Secondary | ICD-10-CM | POA: Diagnosis not present

## 2015-01-31 DIAGNOSIS — Y9289 Other specified places as the place of occurrence of the external cause: Secondary | ICD-10-CM | POA: Insufficient documentation

## 2015-01-31 DIAGNOSIS — S8392XA Sprain of unspecified site of left knee, initial encounter: Secondary | ICD-10-CM | POA: Insufficient documentation

## 2015-01-31 DIAGNOSIS — Y9389 Activity, other specified: Secondary | ICD-10-CM | POA: Insufficient documentation

## 2015-01-31 DIAGNOSIS — S8992XA Unspecified injury of left lower leg, initial encounter: Secondary | ICD-10-CM | POA: Diagnosis not present

## 2015-01-31 MED ORDER — OXYCODONE-ACETAMINOPHEN 5-325 MG PO TABS
1.0000 | ORAL_TABLET | Freq: Once | ORAL | Status: DC
  Filled 2015-01-31: qty 1

## 2015-01-31 MED ORDER — FENTANYL CITRATE (PF) 100 MCG/2ML IJ SOLN
50.0000 ug | Freq: Once | INTRAMUSCULAR | Status: AC
  Administered 2015-01-31: 50 ug via INTRAMUSCULAR
  Filled 2015-01-31: qty 2
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
## 2015-01-31 NOTE — ED Notes (Signed)
Pt reports falling tonight, reports pain to L knee.  Pt reports frequent falls due to chronic back pain, being followed by PCP for this.

## 2015-01-31 NOTE — ED Notes (Signed)
This RN ambulated with pt with a walker. Pt had steady gait, although he states that his pain has not improved. Pt walks with a limp, favoring the left side.

## 2015-01-31 NOTE — Discharge Instructions (Signed)
X-ray today is normal. Keep your knee elevated when at home, ice several times a day.  Follow up with orthopedics specialist.    Knee Pain The knee is the complex joint between your thigh and your lower leg. It is made up of bones, tendons, ligaments, and cartilage. The bones that make up the knee are:  The femur in the thigh.  The tibia and fibula in the lower leg.  The patella or kneecap riding in the groove on the lower femur. CAUSES  Knee pain is a common complaint with many causes. A few of these causes are:  Injury, such as:  A ruptured ligament or tendon injury.  Torn cartilage.  Medical conditions, such as:  Gout  Arthritis  Infections  Overuse, over training, or overdoing a physical activity. Knee pain can be minor or severe. Knee pain can accompany debilitating injury. Minor knee problems often respond well to self-care measures or get well on their own. More serious injuries may need medical intervention or even surgery. SYMPTOMS The knee is complex. Symptoms of knee problems can vary widely. Some of the problems are:  Pain with movement and weight bearing.  Swelling and tenderness.  Buckling of the knee.  Inability to straighten or extend your knee.  Your knee locks and you cannot straighten it.  Warmth and redness with pain and fever.  Deformity or dislocation of the kneecap. DIAGNOSIS  Determining what is wrong may be very straight forward such as when there is an injury. It can also be challenging because of the complexity of the knee. Tests to make a diagnosis may include:  Your caregiver taking a history and doing a physical exam.  Routine X-rays can be used to rule out other problems. X-rays will not reveal a cartilage tear. Some injuries of the knee can be diagnosed by:  Arthroscopy a surgical technique by which a small video camera is inserted through tiny incisions on the sides of the knee. This procedure is used to examine and repair internal  knee joint problems. Tiny instruments can be used during arthroscopy to repair the torn knee cartilage (meniscus).  Arthrography is a radiology technique. A contrast liquid is directly injected into the knee joint. Internal structures of the knee joint then become visible on X-ray film.  An MRI scan is a non X-ray radiology procedure in which magnetic fields and a computer produce two- or three-dimensional images of the inside of the knee. Cartilage tears are often visible using an MRI scanner. MRI scans have largely replaced arthrography in diagnosing cartilage tears of the knee.  Blood work.  Examination of the fluid that helps to lubricate the knee joint (synovial fluid). This is done by taking a sample out using a needle and a syringe. TREATMENT The treatment of knee problems depends on the cause. Some of these treatments are:  Depending on the injury, proper casting, splinting, surgery, or physical therapy care will be needed.  Give yourself adequate recovery time. Do not overuse your joints. If you begin to get sore during workout routines, back off. Slow down or do fewer repetitions.  For repetitive activities such as cycling or running, maintain your strength and nutrition.  Alternate muscle groups. For example, if you are a weight lifter, work the upper body on one day and the lower body the next.  Either tight or weak muscles do not give the proper support for your knee. Tight or weak muscles do not absorb the stress placed on the knee joint.  Keep the muscles surrounding the knee strong.  Take care of mechanical problems.  If you have flat feet, orthotics or special shoes may help. See your caregiver if you need help.  Arch supports, sometimes with wedges on the inner or outer aspect of the heel, can help. These can shift pressure away from the side of the knee most bothered by osteoarthritis.  A brace called an "unloader" brace also may be used to help ease the pressure on the  most arthritic side of the knee.  If your caregiver has prescribed crutches, braces, wraps or ice, use as directed. The acronym for this is PRICE. This means protection, rest, ice, compression, and elevation.  Nonsteroidal anti-inflammatory drugs (NSAIDs), can help relieve pain. But if taken immediately after an injury, they may actually increase swelling. Take NSAIDs with food in your stomach. Stop them if you develop stomach problems. Do not take these if you have a history of ulcers, stomach pain, or bleeding from the bowel. Do not take without your caregiver's approval if you have problems with fluid retention, heart failure, or kidney problems.  For ongoing knee problems, physical therapy may be helpful.  Glucosamine and chondroitin are over-the-counter dietary supplements. Both may help relieve the pain of osteoarthritis in the knee. These medicines are different from the usual anti-inflammatory drugs. Glucosamine may decrease the rate of cartilage destruction.  Injections of a corticosteroid drug into your knee joint may help reduce the symptoms of an arthritis flare-up. They may provide pain relief that lasts a few months. You may have to wait a few months between injections. The injections do have a small increased risk of infection, water retention, and elevated blood sugar levels.  Hyaluronic acid injected into damaged joints may ease pain and provide lubrication. These injections may work by reducing inflammation. A series of shots may give relief for as long as 6 months.  Topical painkillers. Applying certain ointments to your skin may help relieve the pain and stiffness of osteoarthritis. Ask your pharmacist for suggestions. Many over the-counter products are approved for temporary relief of arthritis pain.  In some countries, doctors often prescribe topical NSAIDs for relief of chronic conditions such as arthritis and tendinitis. A review of treatment with NSAID creams found that they  worked as well as oral medications but without the serious side effects. PREVENTION  Maintain a healthy weight. Extra pounds put more strain on your joints.  Get strong, stay limber. Weak muscles are a common cause of knee injuries. Stretching is important. Include flexibility exercises in your workouts.  Be smart about exercise. If you have osteoarthritis, chronic knee pain or recurring injuries, you may need to change the way you exercise. This does not mean you have to stop being active. If your knees ache after jogging or playing basketball, consider switching to swimming, water aerobics, or other low-impact activities, at least for a few days a week. Sometimes limiting high-impact activities will provide relief.  Make sure your shoes fit well. Choose footwear that is right for your sport.  Protect your knees. Use the proper gear for knee-sensitive activities. Use kneepads when playing volleyball or laying carpet. Buckle your seat belt every time you drive. Most shattered kneecaps occur in car accidents.  Rest when you are tired. SEEK MEDICAL CARE IF:  You have knee pain that is continual and does not seem to be getting better.  SEEK IMMEDIATE MEDICAL CARE IF:  Your knee joint feels hot to the touch and you have a  high fever. MAKE SURE YOU:   Understand these instructions.  Will watch your condition.  Will get help right away if you are not doing well or get worse. Document Released: 06/12/2007 Document Revised: 11/07/2011 Document Reviewed: 06/12/2007 [facility] Patient Information 2015 [place], [place]. This information is not intended to replace advice given to you by your health care provider. Make sure you discuss any questions you have with your health care provider.

## 2015-02-02 DIAGNOSIS — M25562 Pain in left knee: Secondary | ICD-10-CM | POA: Diagnosis not present

## 2015-02-02 DIAGNOSIS — M11262 Other chondrocalcinosis, left knee: Secondary | ICD-10-CM | POA: Diagnosis not present

## 2015-02-12 ENCOUNTER — Encounter (HOSPITAL_BASED_OUTPATIENT_CLINIC_OR_DEPARTMENT_OTHER): Payer: Self-pay | Admitting: *Deleted

## 2015-02-12 ENCOUNTER — Emergency Department (HOSPITAL_BASED_OUTPATIENT_CLINIC_OR_DEPARTMENT_OTHER): Payer: Medicare Other

## 2015-02-12 ENCOUNTER — Emergency Department (HOSPITAL_BASED_OUTPATIENT_CLINIC_OR_DEPARTMENT_OTHER)
Admission: EM | Admit: 2015-02-12 | Discharge: 2015-02-12 | Disposition: A | Discharge status: Home / Self Care | Payer: Medicare Other | Attending: Emergency Medicine | Admitting: Emergency Medicine

## 2015-02-12 DIAGNOSIS — E119 Type 2 diabetes mellitus without complications: Secondary | ICD-10-CM | POA: Insufficient documentation

## 2015-02-12 DIAGNOSIS — S92001A Unspecified fracture of right calcaneus, initial encounter for closed fracture: Secondary | ICD-10-CM | POA: Diagnosis not present

## 2015-02-12 DIAGNOSIS — Z8659 Personal history of other mental and behavioral disorders: Secondary | ICD-10-CM | POA: Insufficient documentation

## 2015-02-12 DIAGNOSIS — E780 Pure hypercholesterolemia: Secondary | ICD-10-CM | POA: Insufficient documentation

## 2015-02-12 DIAGNOSIS — Y9389 Activity, other specified: Secondary | ICD-10-CM | POA: Diagnosis not present

## 2015-02-12 DIAGNOSIS — M1990 Unspecified osteoarthritis, unspecified site: Secondary | ICD-10-CM | POA: Insufficient documentation

## 2015-02-12 DIAGNOSIS — Z79899 Other long term (current) drug therapy: Secondary | ICD-10-CM | POA: Diagnosis not present

## 2015-02-12 DIAGNOSIS — G8929 Other chronic pain: Secondary | ICD-10-CM | POA: Insufficient documentation

## 2015-02-12 DIAGNOSIS — K219 Gastro-esophageal reflux disease without esophagitis: Secondary | ICD-10-CM | POA: Diagnosis not present

## 2015-02-12 DIAGNOSIS — W19XXXA Unspecified fall, initial encounter: Secondary | ICD-10-CM

## 2015-02-12 DIAGNOSIS — Y9289 Other specified places as the place of occurrence of the external cause: Secondary | ICD-10-CM | POA: Insufficient documentation

## 2015-02-12 DIAGNOSIS — S92009A Unspecified fracture of unspecified calcaneus, initial encounter for closed fracture: Secondary | ICD-10-CM

## 2015-02-12 DIAGNOSIS — T07XXXA Unspecified multiple injuries, initial encounter: Secondary | ICD-10-CM

## 2015-02-12 DIAGNOSIS — Y998 Other external cause status: Secondary | ICD-10-CM | POA: Insufficient documentation

## 2015-02-12 DIAGNOSIS — S99911A Unspecified injury of right ankle, initial encounter: Secondary | ICD-10-CM | POA: Diagnosis present

## 2015-02-12 DIAGNOSIS — S50311A Abrasion of right elbow, initial encounter: Secondary | ICD-10-CM | POA: Diagnosis not present

## 2015-02-12 DIAGNOSIS — W010XXA Fall on same level from slipping, tripping and stumbling without subsequent striking against object, initial encounter: Secondary | ICD-10-CM | POA: Diagnosis not present

## 2015-02-12 DIAGNOSIS — Z794 Long term (current) use of insulin: Secondary | ICD-10-CM | POA: Insufficient documentation

## 2015-02-12 MED ORDER — TETANUS-DIPHTH-ACELL PERTUSSIS 5-2.5-18.5 LF-MCG/0.5 IM SUSP
0.5000 mL | Freq: Once | INTRAMUSCULAR | Status: AC
  Administered 2015-02-12: 0.5 mL via INTRAMUSCULAR
  Filled 2015-02-12: qty 0.5

## 2015-02-12 NOTE — ED Notes (Signed)
Pt tripped and fell around 4: this pm  Denies loc,  C/o rt ankle pain  Denies any other inj
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
## 2015-02-12 NOTE — Discharge Instructions (Signed)
Please call Dr. Nona Dell office first thing in the morning to be seen tomorrow or Monday. Keep heel elevated and use cold therapy. Use walker when you need to get to bathroom.

## 2015-02-12 NOTE — ED Notes (Signed)
Fall tonight. He tripped. Injury to his right ankle.

## 2015-02-13 DIAGNOSIS — S92044D Nondisplaced other fracture of tuberosity of right calcaneus, subsequent encounter for fracture with routine healing: Secondary | ICD-10-CM | POA: Diagnosis not present

## 2015-02-16 ENCOUNTER — Other Ambulatory Visit: Payer: Self-pay | Admitting: Orthopedic Surgery

## 2015-02-18 ENCOUNTER — Encounter (HOSPITAL_COMMUNITY): Payer: Self-pay | Admitting: *Deleted

## 2015-02-18 MED ORDER — DEXTROSE 5 % IV SOLN
3.0000 g | INTRAVENOUS | Status: AC
  Administered 2015-02-19: 3 g via INTRAVENOUS
  Filled 2015-02-18: qty 3000

## 2015-02-18 MED ORDER — SODIUM CHLORIDE 0.9 % IV SOLN: INTRAVENOUS | Status: DC

## 2015-02-18 NOTE — Progress Notes (Signed)
Pt denies any cardiac history. Denies any recent chest pain or sob. He denies being treated for HTN, found it listed in his problem list. He doesn't know why he is on Carvedilol and Imdur. Pt is diabetic and states his blood sugar usually runs between 85 and 130. States it was 72 this AM.

## 2015-02-19 ENCOUNTER — Encounter (HOSPITAL_COMMUNITY)
Admission: RE | Disposition: A | Discharge status: Skilled Nursing Facility | Payer: Self-pay | Source: Ambulatory Visit | Attending: Orthopedic Surgery

## 2015-02-19 ENCOUNTER — Inpatient Hospital Stay (HOSPITAL_COMMUNITY): Payer: Medicare Other | Admitting: Certified Registered Nurse Anesthetist

## 2015-02-19 ENCOUNTER — Encounter (HOSPITAL_COMMUNITY): Payer: Self-pay

## 2015-02-19 ENCOUNTER — Inpatient Hospital Stay (HOSPITAL_COMMUNITY)
Admission: RE | Admit: 2015-02-19 | Discharge: 2015-02-22 | DRG: 504 | Disposition: A | Discharge status: Skilled Nursing Facility | Payer: Medicare Other | Source: Ambulatory Visit | Attending: Orthopedic Surgery | Admitting: Orthopedic Surgery

## 2015-02-19 DIAGNOSIS — M1990 Unspecified osteoarthritis, unspecified site: Secondary | ICD-10-CM | POA: Diagnosis present

## 2015-02-19 DIAGNOSIS — S82841A Displaced bimalleolar fracture of right lower leg, initial encounter for closed fracture: Secondary | ICD-10-CM | POA: Diagnosis present

## 2015-02-19 DIAGNOSIS — F4310 Post-traumatic stress disorder, unspecified: Secondary | ICD-10-CM | POA: Diagnosis present

## 2015-02-19 DIAGNOSIS — R279 Unspecified lack of coordination: Secondary | ICD-10-CM | POA: Diagnosis not present

## 2015-02-19 DIAGNOSIS — Z9119 Patient's noncompliance with other medical treatment and regimen: Secondary | ICD-10-CM | POA: Diagnosis present

## 2015-02-19 DIAGNOSIS — S92001A Unspecified fracture of right calcaneus, initial encounter for closed fracture: Principal | ICD-10-CM | POA: Diagnosis present

## 2015-02-19 DIAGNOSIS — Z9181 History of falling: Secondary | ICD-10-CM | POA: Diagnosis not present

## 2015-02-19 DIAGNOSIS — E119 Type 2 diabetes mellitus without complications: Secondary | ICD-10-CM | POA: Diagnosis not present

## 2015-02-19 DIAGNOSIS — K269 Duodenal ulcer, unspecified as acute or chronic, without hemorrhage or perforation: Secondary | ICD-10-CM | POA: Diagnosis not present

## 2015-02-19 DIAGNOSIS — Z4789 Encounter for other orthopedic aftercare: Secondary | ICD-10-CM | POA: Diagnosis not present

## 2015-02-19 DIAGNOSIS — E780 Pure hypercholesterolemia: Secondary | ICD-10-CM | POA: Diagnosis present

## 2015-02-19 DIAGNOSIS — I10 Essential (primary) hypertension: Secondary | ICD-10-CM | POA: Diagnosis not present

## 2015-02-19 DIAGNOSIS — M25571 Pain in right ankle and joints of right foot: Secondary | ICD-10-CM | POA: Diagnosis not present

## 2015-02-19 DIAGNOSIS — K219 Gastro-esophageal reflux disease without esophagitis: Secondary | ICD-10-CM | POA: Diagnosis not present

## 2015-02-19 DIAGNOSIS — M21271 Flexion deformity, right ankle and toes: Secondary | ICD-10-CM | POA: Diagnosis not present

## 2015-02-19 DIAGNOSIS — S92011A Displaced fracture of body of right calcaneus, initial encounter for closed fracture: Secondary | ICD-10-CM | POA: Diagnosis not present

## 2015-02-19 DIAGNOSIS — S92201B Fracture of unspecified tarsal bone(s) of right foot, initial encounter for open fracture: Secondary | ICD-10-CM | POA: Diagnosis not present

## 2015-02-19 DIAGNOSIS — E669 Obesity, unspecified: Secondary | ICD-10-CM | POA: Diagnosis present

## 2015-02-19 DIAGNOSIS — W1849XA Other slipping, tripping and stumbling without falling, initial encounter: Secondary | ICD-10-CM | POA: Diagnosis not present

## 2015-02-19 DIAGNOSIS — M5417 Radiculopathy, lumbosacral region: Secondary | ICD-10-CM | POA: Diagnosis not present

## 2015-02-19 DIAGNOSIS — S82843A Displaced bimalleolar fracture of unspecified lower leg, initial encounter for closed fracture: Secondary | ICD-10-CM | POA: Diagnosis present

## 2015-02-19 DIAGNOSIS — F17220 Nicotine dependence, chewing tobacco, uncomplicated: Secondary | ICD-10-CM | POA: Diagnosis present

## 2015-02-19 DIAGNOSIS — Z6841 Body Mass Index (BMI) 40.0 and over, adult: Secondary | ICD-10-CM

## 2015-02-19 DIAGNOSIS — B9681 Helicobacter pylori [H. pylori] as the cause of diseases classified elsewhere: Secondary | ICD-10-CM | POA: Diagnosis not present

## 2015-02-19 DIAGNOSIS — Y9289 Other specified places as the place of occurrence of the external cause: Secondary | ICD-10-CM | POA: Diagnosis not present

## 2015-02-19 DIAGNOSIS — W010XXA Fall on same level from slipping, tripping and stumbling without subsequent striking against object, initial encounter: Secondary | ICD-10-CM | POA: Diagnosis present

## 2015-02-19 DIAGNOSIS — S82844D Nondisplaced bimalleolar fracture of right lower leg, subsequent encounter for closed fracture with routine healing: Secondary | ICD-10-CM | POA: Diagnosis not present

## 2015-02-19 DIAGNOSIS — S92001D Unspecified fracture of right calcaneus, subsequent encounter for fracture with routine healing: Secondary | ICD-10-CM | POA: Diagnosis not present

## 2015-02-19 DIAGNOSIS — M6281 Muscle weakness (generalized): Secondary | ICD-10-CM | POA: Diagnosis not present

## 2015-02-19 HISTORY — DX: Drug induced constipation: K59.03

## 2015-02-19 HISTORY — PX: ORIF CALCANEOUS FRACTURE: SHX5030

## 2015-02-19 HISTORY — DX: Adverse effect of other opioids, initial encounter: T40.2X5A

## 2015-02-19 HISTORY — DX: Pneumonia, unspecified organism: J18.9

## 2015-02-19 HISTORY — DX: Sleep apnea, unspecified: G47.30

## 2015-02-19 LAB — BASIC METABOLIC PANEL
Anion gap: 8 (ref 5–15)
BUN: 27 mg/dL — AB (ref 6–20)
CALCIUM: 8.8 mg/dL — AB (ref 8.9–10.3)
CO2: 27 mmol/L (ref 22–32)
CREATININE: 1.38 mg/dL — AB (ref 0.61–1.24)
Chloride: 101 mmol/L (ref 101–111)
GFR calc Af Amer: 54 mL/min — ABNORMAL LOW (ref >60)
GFR calc non Af Amer: 47 mL/min — ABNORMAL LOW (ref >60)
GLUCOSE: 153 mg/dL — AB (ref 65–99)
Potassium: 4.7 mmol/L (ref 3.5–5.1)
Sodium: 136 mmol/L (ref 135–145)

## 2015-02-19 LAB — CBC
HCT: 39.8 % (ref 39.0–52.0)
Hemoglobin: 13.1 g/dL (ref 13.0–17.0)
MCH: 29.6 pg (ref 26.0–34.0)
MCHC: 32.9 g/dL (ref 30.0–36.0)
MCV: 90 fL (ref 78.0–100.0)
PLATELETS: 286 10*3/uL (ref 150–400)
RBC: 4.42 MIL/uL (ref 4.22–5.81)
RDW: 13.3 % (ref 11.5–15.5)
WBC: 19.2 10*3/uL — ABNORMAL HIGH (ref 4.0–10.5)

## 2015-02-19 LAB — GLUCOSE, CAPILLARY
Glucose-Capillary: 157 mg/dL — ABNORMAL HIGH (ref 65–99)
Glucose-Capillary: 119 mg/dL — ABNORMAL HIGH (ref 65–99)
Glucose-Capillary: 123 mg/dL — ABNORMAL HIGH (ref 65–99)
Glucose-Capillary: 130 mg/dL — ABNORMAL HIGH (ref 65–99)
Glucose-Capillary: 106 mg/dL — ABNORMAL HIGH (ref 65–99)

## 2015-02-19 SURGERY — OPEN REDUCTION INTERNAL FIXATION (ORIF) CALCANEOUS FRACTURE
Anesthesia: General | Site: Ankle | Laterality: Right

## 2015-02-19 MED ORDER — FENTANYL CITRATE (PF) 250 MCG/5ML IJ SOLN
INTRAMUSCULAR | Status: AC
  Filled 2015-02-19: qty 5

## 2015-02-19 MED ORDER — VITAMIN D 1000 UNITS PO TABS
2000.0000 [IU] | ORAL_TABLET | Freq: Every day | ORAL | Status: DC
  Administered 2015-02-19 – 2015-02-21 (×3): 2000 [IU] via ORAL
  Filled 2015-02-19 (×3): qty 2

## 2015-02-19 MED ORDER — LISINOPRIL 20 MG PO TABS
20.0000 mg | ORAL_TABLET | Freq: Every day | ORAL | Status: DC
  Administered 2015-02-20 – 2015-02-22 (×3): 20 mg via ORAL
  Filled 2015-02-19 (×3): qty 1

## 2015-02-19 MED ORDER — MAGNESIUM OXIDE 400 MG PO TABS
400.0000 mg | ORAL_TABLET | Freq: Two times a day (BID) | ORAL | Status: DC
  Administered 2015-02-19 – 2015-02-22 (×7): 400 mg via ORAL
  Filled 2015-02-19 (×15): qty 1

## 2015-02-19 MED ORDER — ENOXAPARIN SODIUM 40 MG/0.4ML ~~LOC~~ SOLN
40.0000 mg | SUBCUTANEOUS | Status: DC
  Administered 2015-02-20 – 2015-02-22 (×3): 40 mg via SUBCUTANEOUS
  Filled 2015-02-19 (×3): qty 0.4

## 2015-02-19 MED ORDER — SUCCINYLCHOLINE CHLORIDE 200 MG/10ML IV SOSY
PREFILLED_SYRINGE | INTRAVENOUS | Status: DC | PRN
  Administered 2015-02-19: 120 mg via INTRAVENOUS

## 2015-02-19 MED ORDER — PHENYLEPHRINE HCL 10 MG/ML IJ SOLN
INTRAMUSCULAR | Status: DC | PRN
  Administered 2015-02-19: 80 ug via INTRAVENOUS
  Administered 2015-02-19 (×2): 120 ug via INTRAVENOUS
  Administered 2015-02-19: 80 ug via INTRAVENOUS
  Administered 2015-02-19: 120 ug via INTRAVENOUS
  Administered 2015-02-19: 80 ug via INTRAVENOUS

## 2015-02-19 MED ORDER — ACETAMINOPHEN 10 MG/ML IV SOLN
1000.0000 mg | Freq: Once | INTRAVENOUS | Status: AC
  Administered 2015-02-19: 1000 mg via INTRAVENOUS

## 2015-02-19 MED ORDER — FENTANYL CITRATE (PF) 100 MCG/2ML IJ SOLN
INTRAMUSCULAR | Status: DC | PRN
  Administered 2015-02-19: 50 ug via INTRAVENOUS
  Administered 2015-02-19 (×2): 25 ug via INTRAVENOUS
  Administered 2015-02-19: 50 ug via INTRAVENOUS

## 2015-02-19 MED ORDER — HYDROMORPHONE HCL 1 MG/ML IJ SOLN
0.2500 mg | INTRAMUSCULAR | Status: DC | PRN
  Administered 2015-02-19 (×4): 0.5 mg via INTRAVENOUS

## 2015-02-19 MED ORDER — PANTOPRAZOLE SODIUM 40 MG PO TBEC
40.0000 mg | DELAYED_RELEASE_TABLET | Freq: Every day | ORAL | Status: DC
  Administered 2015-02-19 – 2015-02-22 (×4): 40 mg via ORAL
  Filled 2015-02-19 (×4): qty 1

## 2015-02-19 MED ORDER — GABAPENTIN 300 MG PO CAPS
300.0000 mg | ORAL_CAPSULE | Freq: Every day | ORAL | Status: DC
  Administered 2015-02-20 – 2015-02-22 (×3): 300 mg via ORAL
  Filled 2015-02-19 (×5): qty 1

## 2015-02-19 MED ORDER — ISOSORBIDE MONONITRATE ER 30 MG PO TB24
30.0000 mg | ORAL_TABLET | Freq: Every day | ORAL | Status: DC
  Administered 2015-02-20 – 2015-02-22 (×3): 30 mg via ORAL
  Filled 2015-02-19 (×3): qty 1

## 2015-02-19 MED ORDER — VITAMIN B-12 1000 MCG PO TABS
500.0000 ug | ORAL_TABLET | Freq: Two times a day (BID) | ORAL | Status: DC
  Administered 2015-02-19 – 2015-02-22 (×7): 500 ug via ORAL
  Filled 2015-02-19 (×7): qty 1

## 2015-02-19 MED ORDER — HYDROCODONE-ACETAMINOPHEN 5-325 MG PO TABS
1.0000 | ORAL_TABLET | ORAL | Status: DC | PRN
  Administered 2015-02-19: 1 via ORAL
  Administered 2015-02-20: 2 via ORAL
  Administered 2015-02-21: 1 via ORAL
  Filled 2015-02-19 (×2): qty 1
  Filled 2015-02-19: qty 2

## 2015-02-19 MED ORDER — INSULIN GLARGINE 100 UNIT/ML ~~LOC~~ SOLN
42.0000 [IU] | Freq: Every day | SUBCUTANEOUS | Status: DC
  Administered 2015-02-19: 42 [IU] via SUBCUTANEOUS
  Filled 2015-02-19 (×4): qty 0.42

## 2015-02-19 MED ORDER — TEARS RENEWED OP SOLN: 1.0000 [drp] | Freq: Two times a day (BID) | OPHTHALMIC | Status: DC

## 2015-02-19 MED ORDER — BUPIVACAINE-EPINEPHRINE 0.5% -1:200000 IJ SOLN
INTRAMUSCULAR | Status: DC | PRN
  Administered 2015-02-19: 17 mL

## 2015-02-19 MED ORDER — LACTATED RINGERS IV SOLN
INTRAVENOUS | Status: DC | PRN
  Administered 2015-02-19 (×2): via INTRAVENOUS

## 2015-02-19 MED ORDER — METOCLOPRAMIDE HCL 5 MG/ML IJ SOLN: 5.0000 mg | Freq: Three times a day (TID) | INTRAMUSCULAR | Status: DC | PRN

## 2015-02-19 MED ORDER — GALANTAMINE HYDROBROMIDE ER 8 MG PO CP24
16.0000 mg | ORAL_CAPSULE | Freq: Every day | ORAL | Status: DC
  Administered 2015-02-20 – 2015-02-22 (×3): 16 mg via ORAL
  Filled 2015-02-19 (×4): qty 2

## 2015-02-19 MED ORDER — VENLAFAXINE HCL ER 150 MG PO CP24
150.0000 mg | ORAL_CAPSULE | Freq: Every day | ORAL | Status: DC
  Administered 2015-02-19 – 2015-02-21 (×3): 150 mg via ORAL
  Filled 2015-02-19 (×3): qty 1

## 2015-02-19 MED ORDER — ACETAMINOPHEN 650 MG RE SUPP: 650.0000 mg | Freq: Four times a day (QID) | RECTAL | Status: DC | PRN

## 2015-02-19 MED ORDER — ONDANSETRON HCL 4 MG/2ML IJ SOLN
INTRAMUSCULAR | Status: DC | PRN
  Administered 2015-02-19: 4 mg via INTRAVENOUS

## 2015-02-19 MED ORDER — ONDANSETRON HCL 4 MG/2ML IJ SOLN: 4.0000 mg | Freq: Four times a day (QID) | INTRAMUSCULAR | Status: DC | PRN

## 2015-02-19 MED ORDER — POLYVINYL ALCOHOL 1.4 % OP SOLN
1.0000 [drp] | Freq: Two times a day (BID) | OPHTHALMIC | Status: DC
  Administered 2015-02-19 – 2015-02-22 (×6): 1 [drp] via OPHTHALMIC
  Filled 2015-02-19: qty 15

## 2015-02-19 MED ORDER — CHLORHEXIDINE GLUCONATE 4 % EX LIQD: 60.0000 mL | Freq: Once | CUTANEOUS | Status: DC

## 2015-02-19 MED ORDER — CARVEDILOL 3.125 MG PO TABS
3.1250 mg | ORAL_TABLET | Freq: Two times a day (BID) | ORAL | Status: DC
  Administered 2015-02-19 – 2015-02-22 (×6): 3.125 mg via ORAL
  Filled 2015-02-19 (×6): qty 1

## 2015-02-19 MED ORDER — SODIUM CHLORIDE 0.9 % IV SOLN
INTRAVENOUS | Status: DC
  Administered 2015-02-19: 13:00:00 via INTRAVENOUS

## 2015-02-19 MED ORDER — DOCUSATE SODIUM 100 MG PO CAPS
100.0000 mg | ORAL_CAPSULE | Freq: Two times a day (BID) | ORAL | Status: DC
  Administered 2015-02-19 – 2015-02-22 (×7): 100 mg via ORAL
  Filled 2015-02-19 (×7): qty 1

## 2015-02-19 MED ORDER — METOCLOPRAMIDE HCL 5 MG PO TABS: 5.0000 mg | ORAL_TABLET | Freq: Three times a day (TID) | ORAL | Status: DC | PRN

## 2015-02-19 MED ORDER — INSULIN ASPART 100 UNIT/ML ~~LOC~~ SOLN: 0.0000 [IU] | Freq: Three times a day (TID) | SUBCUTANEOUS | Status: DC | PRN

## 2015-02-19 MED ORDER — SENNA 8.6 MG PO TABS
1.0000 | ORAL_TABLET | Freq: Two times a day (BID) | ORAL | Status: DC
  Administered 2015-02-19 – 2015-02-22 (×7): 8.6 mg via ORAL
  Filled 2015-02-19 (×7): qty 1

## 2015-02-19 MED ORDER — ONDANSETRON HCL 4 MG PO TABS: 4.0000 mg | ORAL_TABLET | Freq: Four times a day (QID) | ORAL | Status: DC | PRN

## 2015-02-19 MED ORDER — OXYCODONE HCL ER 10 MG PO T12A
10.0000 mg | EXTENDED_RELEASE_TABLET | Freq: Two times a day (BID) | ORAL | Status: DC
  Administered 2015-02-19 – 2015-02-21 (×6): 10 mg via ORAL
  Filled 2015-02-19 (×7): qty 1

## 2015-02-19 MED ORDER — FUROSEMIDE 20 MG PO TABS
20.0000 mg | ORAL_TABLET | Freq: Two times a day (BID) | ORAL | Status: DC
  Administered 2015-02-19 – 2015-02-22 (×6): 20 mg via ORAL
  Filled 2015-02-19 (×6): qty 1

## 2015-02-19 MED ORDER — LIDOCAINE HCL (CARDIAC) 20 MG/ML IV SOLN
INTRAVENOUS | Status: DC | PRN
  Administered 2015-02-19: 100 mg via INTRAVENOUS

## 2015-02-19 MED ORDER — INSULIN ASPART 100 UNIT/ML ~~LOC~~ SOLN
0.0000 [IU] | Freq: Three times a day (TID) | SUBCUTANEOUS | Status: DC
  Administered 2015-02-19 – 2015-02-21 (×2): 3 [IU] via SUBCUTANEOUS
  Administered 2015-02-21: 4 [IU] via SUBCUTANEOUS
  Administered 2015-02-21 – 2015-02-22 (×2): 3 [IU] via SUBCUTANEOUS
  Administered 2015-02-22: 4 [IU] via SUBCUTANEOUS

## 2015-02-19 MED ORDER — SIMVASTATIN 40 MG PO TABS
40.0000 mg | ORAL_TABLET | Freq: Every evening | ORAL | Status: DC
  Administered 2015-02-19 – 2015-02-21 (×3): 40 mg via ORAL
  Filled 2015-02-19 (×3): qty 1

## 2015-02-19 MED ORDER — PHENYLEPHRINE HCL 10 MG/ML IJ SOLN
10.0000 mg | INTRAVENOUS | Status: DC | PRN
  Administered 2015-02-19: 25 ug/min via INTRAVENOUS

## 2015-02-19 MED ORDER — HYDROMORPHONE HCL 1 MG/ML IJ SOLN
INTRAMUSCULAR | Status: AC
  Filled 2015-02-19: qty 1

## 2015-02-19 MED ORDER — PROPOFOL 10 MG/ML IV BOLUS
INTRAVENOUS | Status: DC | PRN
  Administered 2015-02-19: 150 mg via INTRAVENOUS

## 2015-02-19 MED ORDER — ACETAMINOPHEN 325 MG PO TABS: 650.0000 mg | ORAL_TABLET | Freq: Four times a day (QID) | ORAL | Status: DC | PRN

## 2015-02-19 MED ORDER — ACETAMINOPHEN 10 MG/ML IV SOLN
INTRAVENOUS | Status: AC
  Filled 2015-02-19: qty 100

## 2015-02-19 MED ORDER — 0.9 % SODIUM CHLORIDE (POUR BTL) OPTIME
TOPICAL | Status: DC | PRN
  Administered 2015-02-19: 1000 mL

## 2015-02-19 MED ORDER — GABAPENTIN 600 MG PO TABS
600.0000 mg | ORAL_TABLET | Freq: Every day | ORAL | Status: DC
  Administered 2015-02-19 – 2015-02-21 (×3): 600 mg via ORAL
  Filled 2015-02-19 (×4): qty 1

## 2015-02-19 MED ORDER — BUPIVACAINE-EPINEPHRINE (PF) 0.5% -1:200000 IJ SOLN
INTRAMUSCULAR | Status: AC
  Filled 2015-02-19: qty 30

## 2015-02-19 SURGICAL SUPPLY — 62 items
BANDAGE ESMARK 6X9 LF (GAUZE/BANDAGES/DRESSINGS) ×1 IMPLANT
BLADE SURG 10 STRL SS (BLADE) ×3 IMPLANT
BNDG CMPR 9X6 STRL LF SNTH (GAUZE/BANDAGES/DRESSINGS) ×1
BNDG COHESIVE 4X5 TAN STRL (GAUZE/BANDAGES/DRESSINGS) ×3 IMPLANT
BNDG COHESIVE 6X5 TAN STRL LF (GAUZE/BANDAGES/DRESSINGS) ×3 IMPLANT
BNDG ESMARK 6X9 LF (GAUZE/BANDAGES/DRESSINGS) ×3
CANISTER SUCT 3000ML PPV (MISCELLANEOUS) ×3 IMPLANT
CHLORAPREP W/TINT 26ML (MISCELLANEOUS) ×3 IMPLANT
COVER SURGICAL LIGHT HANDLE (MISCELLANEOUS) ×3 IMPLANT
CUFF TOURNIQUET SINGLE 34IN LL (TOURNIQUET CUFF) ×3 IMPLANT
CUFF TOURNIQUET SINGLE 44IN (TOURNIQUET CUFF) IMPLANT
DRAPE OEC MINIVIEW 54X84 (DRAPES) ×3 IMPLANT
DRAPE U-SHAPE 47X51 STRL (DRAPES) ×3 IMPLANT
DRSG ADAPTIC 3X8 NADH LF (GAUZE/BANDAGES/DRESSINGS) IMPLANT
DRSG MEPITEL 3X4 ME34 (GAUZE/BANDAGES/DRESSINGS) ×2 IMPLANT
DRSG PAD ABDOMINAL 8X10 ST (GAUZE/BANDAGES/DRESSINGS) ×6 IMPLANT
ELECT REM PT RETURN 9FT ADLT (ELECTROSURGICAL) ×3
ELECTRODE REM PT RTRN 9FT ADLT (ELECTROSURGICAL) ×1 IMPLANT
GAUZE SPONGE 4X4 12PLY STRL (GAUZE/BANDAGES/DRESSINGS) IMPLANT
GLOVE BIO SURGEON STRL SZ7 (GLOVE) ×2 IMPLANT
GLOVE BIO SURGEON STRL SZ8 (GLOVE) ×3 IMPLANT
GLOVE BIOGEL PI IND STRL 7.5 (GLOVE) ×2 IMPLANT
GLOVE BIOGEL PI IND STRL 8 (GLOVE) ×1 IMPLANT
GLOVE BIOGEL PI INDICATOR 7.5 (GLOVE) ×4
GLOVE BIOGEL PI INDICATOR 8 (GLOVE) ×2
GOWN STRL REUS W/ TWL LRG LVL3 (GOWN DISPOSABLE) ×2 IMPLANT
GOWN STRL REUS W/ TWL XL LVL3 (GOWN DISPOSABLE) ×1 IMPLANT
GOWN STRL REUS W/TWL LRG LVL3 (GOWN DISPOSABLE) ×6
GOWN STRL REUS W/TWL XL LVL3 (GOWN DISPOSABLE) ×3
KIT BASIN OR (CUSTOM PROCEDURE TRAY) ×3 IMPLANT
KIT ROOM TURNOVER OR (KITS) ×3 IMPLANT
NEEDLE 22X1 1/2 (OR ONLY) (NEEDLE) ×4 IMPLANT
NS IRRIG 1000ML POUR BTL (IV SOLUTION) ×3 IMPLANT
PACK ORTHO EXTREMITY (CUSTOM PROCEDURE TRAY) ×3 IMPLANT
PAD ABD 8X10 STRL (GAUZE/BANDAGES/DRESSINGS) ×4 IMPLANT
PAD ARMBOARD 7.5X6 YLW CONV (MISCELLANEOUS) ×6 IMPLANT
PAD CAST 4YDX4 CTTN HI CHSV (CAST SUPPLIES) ×2 IMPLANT
PADDING CAST COTTON 4X4 STRL (CAST SUPPLIES) ×6
PADDING CAST COTTON 6X4 STRL (CAST SUPPLIES) ×2 IMPLANT
PIN THREADED GUIDE ACE (PIN) ×4 IMPLANT
SCREW CANN 8.0 50MM (Screw) ×2 IMPLANT
SCREW CANN 8.0 55MM (Screw) ×2 IMPLANT
SOAP 2 % CHG 4 OZ (WOUND CARE) ×3 IMPLANT
SPONGE GAUZE 4X4 12PLY STER LF (GAUZE/BANDAGES/DRESSINGS) ×2 IMPLANT
SPONGE LAP 18X18 X RAY DECT (DISPOSABLE) ×3 IMPLANT
STOCKINETTE IMPERVIOUS 9X36 MD (GAUZE/BANDAGES/DRESSINGS) ×3 IMPLANT
SUCTION FRAZIER TIP 10 FR DISP (SUCTIONS) ×1 IMPLANT
SUT ETHILON 4 0 PS 2 18 (SUTURE) ×4 IMPLANT
SUT PROLENE 3 0 PS 2 (SUTURE) ×1 IMPLANT
SUT VIC AB 0 CT1 18XCR BRD 8 (SUTURE) ×2 IMPLANT
SUT VIC AB 0 CT1 8-18 (SUTURE)
SUT VIC AB 2-0 CT1 27 (SUTURE) ×3
SUT VIC AB 2-0 CT1 TAPERPNT 27 (SUTURE) ×2 IMPLANT
SUT VIC AB 3-0 PS2 18 (SUTURE) ×3
SUT VIC AB 3-0 PS2 18XBRD (SUTURE) ×1 IMPLANT
SYR CONTROL 10ML LL (SYRINGE) ×2 IMPLANT
TOWEL OR 17X24 6PK STRL BLUE (TOWEL DISPOSABLE) ×3 IMPLANT
TOWEL OR 17X26 10 PK STRL BLUE (TOWEL DISPOSABLE) ×3 IMPLANT
TUBE CONNECTING 12'X1/4 (SUCTIONS)
TUBE CONNECTING 12X1/4 (SUCTIONS) ×1 IMPLANT
WASHER 8.0 (Washer) ×4 IMPLANT
WATER STERILE IRR 1000ML POUR (IV SOLUTION) ×3 IMPLANT

## 2015-02-19 NOTE — Progress Notes (Signed)
Informed Dr. Ola Spurr re: temp.
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
## 2015-02-19 NOTE — Anesthesia Preprocedure Evaluation (Addendum)
Anesthesia Evaluation  Patient identified by MRN, date of birth, ID band Patient awake    Reviewed: Allergy & Precautions, H&P , NPO status , Patient's Chart, lab work & pertinent test results, reviewed documented beta blocker date and time   Airway Mallampati: III  TM Distance: >3 FB Neck ROM: Full    Dental no notable dental hx. (+) Edentulous Upper, Edentulous Lower, Dental Advisory Given   Pulmonary sleep apnea ,  breath sounds clear to auscultation  Pulmonary exam normal       Cardiovascular hypertension, Pt. on medications and Pt. on home beta blockers Rhythm:Regular Rate:Normal     Neuro/Psych PSYCHIATRIC DISORDERS negative neurological ROS     GI/Hepatic Neg liver ROS, PUD,   Endo/Other  diabetes, Type 1, Insulin Dependent  Renal/GU negative Renal ROS  negative genitourinary   Musculoskeletal  (+) Arthritis -, Osteoarthritis,    Abdominal   Peds  Hematology negative hematology ROS (+)   Anesthesia Other Findings   Reproductive/Obstetrics negative OB ROS                           Anesthesia Physical Anesthesia Plan  ASA: III  Anesthesia Plan: General   Post-op Pain Management:    Induction: Intravenous  Airway Management Planned: Oral ETT  Additional Equipment:   Intra-op Plan:   Post-operative Plan: Extubation in OR  Informed Consent: I have reviewed the patients History and Physical, chart, labs and discussed the procedure including the risks, benefits and alternatives for the proposed anesthesia with the patient or authorized representative who has indicated his/her understanding and acceptance.   Dental advisory given  Plan Discussed with: CRNA  Anesthesia Plan Comments:       Anesthesia Quick Evaluation

## 2015-02-19 NOTE — Transfer of Care (Signed)
Immediate Anesthesia Transfer of Care Note  Patient: [name]  Procedure(s) Performed: Procedure(s): OPEN REDUCTION INTERNAL FIXATION (ORIF) RIGHT CALCANEOUS FRACTURE (Right)  Patient Location: PACU  Anesthesia Type:General  Level of Consciousness: awake, alert , oriented and patient cooperative  Airway & Oxygen Therapy: Patient Spontanous Breathing and Patient connected to face mask oxygen  Post-op Assessment: Report given to RN, Post -op Vital signs reviewed and stable, Patient moving all extremities and Patient moving all extremities X 4  Post vital signs: Reviewed and stable  Last Vitals:  Filed Vitals:   02/19/15 0646  BP:   Pulse:   Temp: 37.5 C  Resp:     Complications: No apparent anesthesia complications
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
## 2015-02-19 NOTE — Anesthesia Postprocedure Evaluation (Signed)
  Anesthesia Post-op Note  Patient: [name]  Procedure(s) Performed: Procedure(s): OPEN REDUCTION INTERNAL FIXATION (ORIF) RIGHT CALCANEOUS FRACTURE (Right)  Patient Location: PACU  Anesthesia Type:General  Level of Consciousness: awake and alert   Airway and Oxygen Therapy: Patient Spontanous Breathing  Post-op Pain: moderate  Post-op Assessment: Post-op Vital signs reviewed, Patient's Cardiovascular Status Stable and Respiratory Function Stable  Post-op Vital Signs: Reviewed  Filed Vitals:   02/19/15 1000  BP:   Pulse:   Temp: 37.1 C  Resp:     Complications: No apparent anesthesia complications

## 2015-02-19 NOTE — Anesthesia Procedure Notes (Signed)
Procedure Name: Intubation Date/Time: 02/19/2015 7:34 AM Performed by: Shirlyn Goltz Pre-anesthesia Checklist: Patient identified, Emergency Drugs available, Suction available and Patient being monitored Patient Re-evaluated:Patient Re-evaluated prior to inductionOxygen Delivery Method: Circle system utilized Preoxygenation: Pre-oxygenation with 100% oxygen Ventilation: Mask ventilation without difficulty and Oral airway inserted - appropriate to patient size Laryngoscope Size: Mac and 4 Grade View: Grade I Tube type: Oral Tube size: 7.0 mm Number of attempts: 1 Airway Equipment and Method: Stylet Placement Confirmation: ETT inserted through vocal cords under direct vision,  positive ETCO2 and breath sounds checked- equal and bilateral Secured at: 21 cm Tube secured with: Tape Dental Injury: Teeth and Oropharynx as per pre-operative assessment

## 2015-02-19 NOTE — Brief Op Note (Signed)
02/19/2015  8:21 AM  PATIENT:  [name]  81 y.o. male  PRE-OPERATIVE DIAGNOSIS:  RIGHT CALCANEUS FRACTURE   POST-OPERATIVE DIAGNOSIS:  Same  Procedure(s): 1.  OPEN REDUCTION INTERNAL FIXATION (ORIF) RIGHT CALCANEOUS FRACTURE 2.  Lateral and Harris heel xrays of the right foot  SURGEON:  [name], MD  ASSISTANT: Mechele Claude, PA-C  ANESTHESIA:   General  EBL:  minimal   TOURNIQUET:  n/a  COMPLICATIONS:  None apparent  DISPOSITION:  Extubated, awake and stable to recovery.  DICTATION ID:  [id number]

## 2015-02-19 NOTE — Progress Notes (Signed)
Utilization review complete. Sukhmani Fetherolf RN CCM Case Mgmt phone [phone] 

## 2015-02-19 NOTE — Op Note (Signed)
NAMEFINNLY, BRIETZKE NO.:  [id number]  MEDICAL RECORD NO.:  [id number]  LOCATION:  [facility]                         FACILITY:  [facility]  PHYSICIAN:  [name], MD        DATE OF BIRTH:  01-11-1932  DATE OF PROCEDURE:  02/19/2015 DATE OF DISCHARGE:                              OPERATIVE REPORT   PREOPERATIVE DIAGNOSIS:  Right calcaneus fracture.  POSTOPERATIVE DIAGNOSIS:  Right calcaneus fracture.  PROCEDURE: 1. Open reduction and internal fixation of right calcaneus fracture. 2. Lateral and Harris heel radiographs of the right foot.  SURGEON:  [name], MD  ASSISTANT:  Mechele Claude, PA-C  ANESTHESIA:  General.  ESTIMATED BLOOD LOSS:  Minimal.  TOURNIQUET TIME:  Zero.  COMPLICATIONS:  None apparent.  DISPOSITION:  Extubated, awake and stable to recovery.  INDICATIONS FOR PROCEDURE:  The patient is an 81 year old male with past medical history of diabetes.  He injured his right foot when he tripped on a curb.  He was found in the emergency department to have a displaced tongue-type fracture of the calcaneus.  He presents now for operative treatment of this displaced unstable fracture.  He understands the risks and benefits, the alternative treatment options, and elects surgical treatment.  He specifically understands risks of bleeding, infection, nerve damage, blood clots, need for additional surgery, continued pain, nonunion, amputation, and death.  PROCEDURE IN DETAIL:  After preoperative consent was obtained and the correct operative site was identified, the patient was brought to the operating room and placed supine on the operating table.  General anesthesia was induced.  Preoperative antibiotics were administered. Surgical time-out was taken.  Right lower extremity was prepped and draped in standard sterile fashion with tourniquet around the thigh.  A stab incision was made just anterior to the Achilles, the lateral aspect of the hindfoot.   Blunt dissection was carried down through the subcutaneous tissues to the superior aspect of the tuberosity.  A K-wire for the old [employer], 8.0 cannulated screw set was selected.  It was drilled into the superior aspect of the calcaneus and across the fracture site.  Lateral and Harris heel radiographs confirmed appropriate position of this guide pin.  An 8 mm x 55 mm partially threaded screw with a washer was then inserted over the guide pin.  This was noted to reduce the fracture appropriately.  The screw was noted to have adequate purchase.  Second stab incision was then made on the medial aspect of the hindfoot just anterior to the Achilles.  Again blunt dissection was carried down through the subcutaneous tissue to the calcaneus.  A second guide pin was inserted across the fracture site and into the plantar aspect of the calcaneus.  Lateral and Harris heel radiographs confirmed appropriate position of the guide pin.  A 50 mm x 8 mm cannulated screw was then inserted.  It was noted to have excellent purchase and further compressed the fracture site.  The lateral screw was then re-tightened.  Lateral and Harris heel radiographs were obtained showing appropriate reduction of the fracture and appropriate position and length of both screws.  Both wounds were then irrigated and closed with nylon.  Sterile  dressings were applied followed by well- padded short-leg splint.  The patient was then awakened from anesthesia and transported to the recovery room in stable condition.  FOLLOWUP PLAN:  The patient will be nonweightbearing on the right lower extremity.  He will be admitted for physical therapy and will need skilled nursing facility placement for his convalescence.  RADIOGRAPHS:  Lateral and Harris heel radiographs of the right foot were obtained intraoperatively today.  These show interval reduction and fixation of the tongue-type calcaneus fracture.  No other acute injuries noted.   Fracture is appropriately aligned.  Hardware is appropriately positioned and of the appropriate length.  Mechele Claude, PA-C was present and scrubbed for the duration of the case.  His assistance was essential in positioning the patient, prepping and draping, gaining and maintaining exposure, performing the operation, closing and dressing the wounds, and applying the splint.     [name], MD     JH/MEDQ  D:  02/19/2015  T:  02/19/2015  Job:  [id number]

## 2015-02-20 ENCOUNTER — Encounter (HOSPITAL_COMMUNITY): Payer: Self-pay | Admitting: Orthopedic Surgery

## 2015-02-20 DIAGNOSIS — S82843A Displaced bimalleolar fracture of unspecified lower leg, initial encounter for closed fracture: Secondary | ICD-10-CM | POA: Diagnosis present

## 2015-02-20 DIAGNOSIS — S82841A Displaced bimalleolar fracture of right lower leg, initial encounter for closed fracture: Secondary | ICD-10-CM | POA: Diagnosis present

## 2015-02-20 LAB — GLUCOSE, CAPILLARY
GLUCOSE-CAPILLARY: 83 mg/dL (ref 65–99)
Glucose-Capillary: 80 mg/dL (ref 65–99)
Glucose-Capillary: 115 mg/dL — ABNORMAL HIGH (ref 65–99)
Glucose-Capillary: 118 mg/dL — ABNORMAL HIGH (ref 65–99)

## 2015-02-20 MED ORDER — DOCUSATE SODIUM 100 MG PO CAPS: 100.0000 mg | ORAL_CAPSULE | Freq: Two times a day (BID) | ORAL | Status: DC

## 2015-02-20 MED ORDER — OXYCODONE HCL 5 MG PO TABS: 5.0000 mg | ORAL_TABLET | ORAL | Status: DC | PRN

## 2015-02-20 MED ORDER — ASPIRIN EC 325 MG PO TBEC: 325.0000 mg | DELAYED_RELEASE_TABLET | Freq: Every day | ORAL | Status: DC

## 2015-02-20 MED ORDER — SENNA 8.6 MG PO TABS
2.0000 | ORAL_TABLET | Freq: Two times a day (BID) | ORAL | Status: DC
Start: 2015-02-20 — End: 2016-02-11

## 2015-02-20 NOTE — Clinical Social Work Note (Signed)
Clinical Social Work Assessment  Patient Details  Name: [name] MRN: [id number] Date of Birth: Jun 06, 1932  Date of referral:  02/20/15               Reason for consult:  Discharge Planning, Facility Placement                Permission sought to share information with:  Family Supports, Customer service manager Permission granted to share information::  Yes, Verbal Permission Granted  Name::     [place]::  [facility]  Relationship::  Wife  Contact Information:  [phone]  Housing/Transportation Living arrangements for the past 2 months:  Single Family Home Source of Information:  Spouse Patient Interpreter Needed:  None Criminal Activity/Legal Involvement Pertinent to Current Situation/Hospitalization:  No - Comment as needed Significant Relationships:  Spouse Lives with:  Spouse Do you feel safe going back to the place where you live?  No (High fall risk) Need for family participation in patient care:  Yes (Comment) (Per RN, patient with some confusion at this time. Requiring safety sitter at bedside.)  Care giving concerns:  Patient's wife expressed concern regarding discharge plan. Patient's wife aware of PT recommendation, but is unfamiliar with SNF placement. CSW offered support to patient's wife.    Social Worker assessment / plan:  CSW received referral for possible SNF placement at time of discharge. CSW met with patient's wife at bedside to discuss discharge disposition. Patient's wife understanding and agreeable to PT recommendation of SNF placement. Patient's wife does not currently have preference for SNF, but is requesting a facility in close proximity to patient's house. CSW to continue to follow and assist with discharge planning needs.  Employment status:  Retired Forensic scientist:  Medicare PT Recommendations:  [place] / Referral to community resources:  [place]  Patient/Family's Response to care:  Patient's wife understanding and agreeable to CSW plan of care.  Patient/Family's Understanding of and Emotional Response to Diagnosis, Current Treatment, and Prognosis:  Patient's wife understanding and agreeable to CSW plan of care.  Emotional Assessment Appearance:  Appears stated age Attitude/Demeanor/Rapport:  Other (CSW spoke with patient's wife at bedside.) Affect (typically observed):  Other (CSW spoke with patient's wife at bedside.) Orientation:  Oriented to Self, Oriented to Place, Oriented to  Time, Oriented to Situation, Fluctuating Orientation (Suspected and/or reported Sundowners) (Per RN, patient able to answer questions appropriately but is having behaviors that demonstrate confusing (throwing food on floor, throwing urine on floor)) Alcohol / Substance use:  Not Applicable Psych involvement (Current and /or in the community):  No (Comment) (Not appropriate on this admission.)  Discharge Needs  Concerns to be addressed:  No discharge needs identified Readmission within the last 30 days:  No Current discharge risk:  None Barriers to Discharge:  No Barriers Identified   Caroline Sauger, LCSW 02/20/2015, 10:20 AM  [phone]

## 2015-02-20 NOTE — Discharge Instructions (Signed)
John Hewitt, MD °[facility] ° °Please read the following information regarding your care after surgery. ° °Medications  °You only need a prescription for the narcotic pain medicine (ex. oxycodone, Percocet, Norco).  All of the other medicines listed below are available over the counter. °X acetominophen (Tylenol) 650 mg every 4-6 hours as you need for minor pain °X oxycodone as prescribed for moderate to severe pain °?  ° °Narcotic pain medicine (ex. oxycodone, Percocet, Vicodin) will cause constipation.  To prevent this problem, take the following medicines while you are taking any pain medicine. °X docusate sodium (Colace) 100 mg twice a day X senna (Senokot) 2 tablets twice a day ° °X To help prevent blood clots, take an aspirin (325 mg) once a day for a month after surgery.  You should also get up every hour while you are awake to move around.   ° °Weight Bearing °X Do not bear any weight on the operated leg or foot. ° °Cast / Splint / Dressing °X Keep your splint or cast clean and dry.  Don’t put anything (coat hanger, pencil, etc) down inside of it.  If it gets damp, use a hair dryer on the cool setting to dry it.  If it gets soaked, call the office to schedule an appointment for a cast change. °  ° °After your dressing, cast or splint is removed; you may shower, but do not soak or scrub the wound.  Allow the water to run over it, and then gently pat it dry. ° °Swelling °It is normal for you to have swelling where you had surgery.  To reduce swelling and pain, keep your toes above your nose for at least 3 days after surgery.  It may be necessary to keep your foot or leg elevated for several weeks.  If it hurts, it should be elevated. ° °Follow Up °Call my office at [phone] when you are discharged from the hospital or surgery center to schedule an appointment to be seen two weeks after surgery. ° °Call my office at [phone] if you develop a fever >101.5° F, nausea, vomiting, bleeding from  the surgical site or severe pain.   ° ° °
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
## 2015-02-20 NOTE — Discharge Summary (Signed)
Physician Discharge Summary  Patient ID: [name] MRN: [id number] DOB/AGE: 1931/11/23 81 y.o.  Admit date: 02/19/2015 Discharge date: 02/20/2015  Admission Diagnoses: Right calcaneous fracture  Closed displaced bimalleolar fracture of right ankle Diabetes Obesity Lumbosacral radiculopathy at L5 Hyperglycemia HTN Constipation Duodenal ulcer, perforated s/p repair/omental patch H-pylori gastritis  Discharge Diagnoses:  Active Problems:   Closed displaced bimalleolar fracture of right ankle   Bimalleolar ankle fracture Same as above S/P ORIF R ankle fracture   Discharged Condition: stable  Hospital Course: Patient sustained a fall and reported to the ED 6/16 with c/o of R ankle pain.  CT of R foot was obtained and revealed unstable R calcaneous fracture.  Patient was then referred to the care of Dr. Doran Durand.  Patient was brought to the OR on 02/19/15 and ORIF of R calcaneous fracture was successfully performed.  Patient was then transferred to the floor where OT, PT, and social work was consulted.  He has tolerated procedure and inpatient stay without complication.  He's d/c'd now to SNF for continued care and PT/OT.  Continue NWB on R LE.  Consults: orthopedic surgery and PT, OT, and social work  Significant Diagnostic Studies: radiology: CT scan: revealed unstable R calcaneous fracture  Treatments: surgery: As above  Discharge Exam: Blood pressure 152/68, pulse 88, temperature 100 F (37.8 C), temperature source Oral, resp. rate 21, height 5\' 11"  (1.803 m), weight 136.079 kg (300 lb), SpO2 97 %. Obese male in NAD, O&A x3, non-labored respirations, EOMs intact, distal pulses 2+, posterior splint is C/D/I, sensation intact to light touch of toes.  Able to DF and PF his toes. Exhibits TKE and KF to 120 degrees.  No lymphadenopathy, streaking, or signs of infection.   Disposition: To SNF  Discharge Instructions    Call MD / Call 911    Complete by:  As directed   If you  experience chest pain or shortness of breath, CALL 911 and be transported to the hospital emergency room.  If you develope a fever above 101 F, pus (white drainage) or increased drainage or redness at the wound, or calf pain, call your surgeon's office.     Constipation Prevention    Complete by:  As directed   Drink plenty of fluids.  Prune juice may be helpful.  You may use a stool softener, such as Colace (over the counter) 100 mg twice a day.  Use MiraLax (over the counter) for constipation as needed.     Diet - low sodium heart healthy    Complete by:  As directed      Non weight bearing    Complete by:  As directed   Laterality:  right  Extremity:  Lower            Medication List    STOP taking these medications        methocarbamol 750 MG tablet  Commonly known as:  ROBAXIN      TAKE these medications        aspirin EC 325 MG tablet  Take 1 tablet (325 mg total) by mouth daily.     carvedilol 3.125 MG tablet  Commonly known as:  COREG  Take 3.125 mg by mouth 2 (two) times daily with a meal.     dextran 70-hypromellose ophthalmic solution  Place 1 drop into both eyes 2 (two) times daily.     docusate sodium 100 MG capsule  Commonly known as:  COLACE  Take 1 capsule (100 mg  total) by mouth 2 (two) times daily. While taking narcotic pain medicine.     furosemide 20 MG tablet  Commonly known as:  LASIX  Take 1 tablet (20 mg total) by mouth 2 (two) times daily.     gabapentin 300 MG capsule  Commonly known as:  NEURONTIN  Take 300-600 mg by mouth 2 (two) times daily. 1 capsule in the morning, and 2 in the evening     galantamine 16 MG 24 hr capsule  Commonly known as:  RAZADYNE ER  Take 16 mg by mouth daily with breakfast.     insulin aspart 100 UNIT/ML injection  Commonly known as:  novoLOG  Inject 0-15 Units into the skin 3 (three) times daily with meals. Per sensitive sliding scale     insulin glargine 100 UNIT/ML injection  Commonly known as:  LANTUS   Inject 42 Units into the skin at bedtime.     isosorbide mononitrate 30 MG 24 hr tablet  Commonly known as:  IMDUR  Take 30 mg by mouth daily.     lisinopril 20 MG tablet  Commonly known as:  PRINIVIL,ZESTRIL  Take 20 mg by mouth daily.     magnesium oxide 400 MG tablet  Commonly known as:  MAG-OX  Take 400 mg by mouth 2 (two) times daily.     oxyCODONE 5 MG immediate release tablet  Commonly known as:  ROXICODONE  Take 1-2 tablets (5-10 mg total) by mouth every 4 (four) hours as needed for moderate pain or severe pain.     pantoprazole 40 MG tablet  Commonly known as:  PROTONIX  Take 1 tablet (40 mg total) by mouth daily.     PRESERVISION AREDS 2 PO  Take 1 capsule by mouth 2 (two) times daily.     senna 8.6 MG Tabs tablet  Commonly known as:  SENOKOT  Take 2 tablets (17.2 mg total) by mouth 2 (two) times daily.     simvastatin 40 MG tablet  Commonly known as:  ZOCOR  Take 40 mg by mouth every evening.     venlafaxine XR 150 MG 24 hr capsule  Commonly known as:  EFFEXOR-XR  Take 150 mg by mouth daily after supper.     vitamin B-12 1000 MCG tablet  Commonly known as:  CYANOCOBALAMIN  Take 500 mcg by mouth 2 (two) times daily.     Vitamin D2 2000 UNITS Tabs  Take 1 tablet by mouth at bedtime.         Signed Mechele Claude, PA-C, [facility] Office:  [phone]

## 2015-02-20 NOTE — Clinical Social Work Placement (Signed)
   CLINICAL SOCIAL WORK PLACEMENT  NOTE  Date:  02/20/2015  Patient Details  Name: [name] MRN: [id number] Date of Birth: September 19, 1931  Clinical Social Work is seeking post-discharge placement for this patient at the [place] level of care (*CSW will initial, date and re-position this form in  chart as items are completed):  Yes   Patient/family provided with [place] Work Department's list of facilities offering this level of care within the geographic area requested by the patient (or if unable, by the patient's family).  Yes   Patient/family informed of their freedom to choose among providers that offer the needed level of care, that participate in Medicare, Medicaid or managed care program needed by the patient, have an available bed and are willing to accept the patient.  Yes   Patient/family informed of [facility]'s ownership interest in [facility] and [facility], as well as of the fact that they are under no obligation to receive care at these facilities.  PASRR submitted to EDS on 02/20/15     PASRR number received on 02/20/15     Existing PASRR number confirmed on  (n/a)     FL2 transmitted to all facilities in geographic area requested by pt/family on 02/20/15     FL2 transmitted to all facilities within larger geographic area on  (n/a)     Patient informed that his/her managed care company has contracts with or will negotiate with certain facilities, including the following:   (yes, [facility])         Patient/family informed of bed offers received.  Patient chooses bed at       Physician recommends and patient chooses bed at      Patient to be transferred to   on  .  Patient to be transferred to facility by       Patient family notified on   of transfer.  Name of family member notified:        PHYSICIAN Please sign FL2     Additional Comment:     _______________________________________________ Caroline Sauger, LCSW 02/20/2015, 10:24 AM [phone]

## 2015-02-20 NOTE — Progress Notes (Signed)
Subjective: 1 Day Post-Op Procedure(s) (LRB): OPEN REDUCTION INTERNAL FIXATION (ORIF) RIGHT CALCANEOUS FRACTURE (Right)  Patient reports pain as mild to moderate.  Denies fever, chills, N/V.  States he is able to wiggle toes and has sensation in his R LE.  Nurse states that he has been non-compliant with staying in bed, NWB on R LE, and has pulled out his IV.  Nurses have made several trips to his room last night and this morning in order to ensure his safety and compliance.  Objective:   VITALS:  Temp:  [98.1 F (36.7 C)-100.4 F (38 C)] 100 F (37.8 C) (06/24 0530) Pulse Rate:  [64-88] 88 (06/24 0530) Resp:  [13-21] 21 (06/24 0530) BP: (92-152)/(43-68) 152/68 mmHg (06/24 0530) SpO2:  [92 %-98 %] 97 % (06/24 0530)  Neurologically intact Neurovascular intact Sensation intact distally Intact pulses distally Dorsiflexion/Plantar flexion intact Dressing is C/D/I   LABS  Recent Labs  02/19/15 0642  HGB 13.1  WBC 19.2*  PLT 286    Recent Labs  02/19/15 0642  NA 136  K 4.7  CL 101  CO2 27  BUN 27*  CREATININE 1.38*  GLUCOSE 153*   No results for input(s): LABPT, INR in the last 72 hours.   Assessment/Plan: 1 Day Post-Op Procedure(s) (LRB): OPEN REDUCTION INTERNAL FIXATION (ORIF) RIGHT CALCANEOUS FRACTURE (Right)  Up with therapy Plan for future discharge to a SNF  Bedside sitter for patient to ensure compliance and patient safety.  Mechele Claude, PA-C, [facility] [employer] Office:  [phone]
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
## 2015-02-21 LAB — GLUCOSE, CAPILLARY
Glucose-Capillary: 123 mg/dL — ABNORMAL HIGH (ref 65–99)
Glucose-Capillary: 129 mg/dL — ABNORMAL HIGH (ref 65–99)
Glucose-Capillary: 151 mg/dL — ABNORMAL HIGH (ref 65–99)
Glucose-Capillary: 142 mg/dL — ABNORMAL HIGH (ref 65–99)

## 2015-02-21 NOTE — Progress Notes (Signed)
CSW spoke with wife and dauther in law at the bedside.   CSW provided bed offers to Pt and Pt's family will visit facilities and give bed choice today for d/c.    [facility]  2S, 8M,3S, 5N, 6N [phone]

## 2015-02-21 NOTE — Progress Notes (Signed)
    Subjective: 2 Days Post-Op Procedure(s) (LRB): OPEN REDUCTION INTERNAL FIXATION (ORIF) RIGHT CALCANEOUS FRACTURE (Right) Patient reports pain as moderate.   Denies CP or SOB.  Voiding without difficulty. Positive flatus. Objective: Vital signs in last 24 hours: Temp:  [98.4 F (36.9 C)-98.6 F (37 C)] 98.6 F (37 C) (06/25 0528) Pulse Rate:  [65-77] 77 (06/25 0528) Resp:  [20] 20 (06/25 0528) BP: (109-122)/(62-64) 122/64 mmHg (06/25 0528) SpO2:  [93 %-95 %] 93 % (06/25 0528)  Intake/Output from previous day: 06/24 0701 - 06/25 0700 In: 480 [P.O.:480] Out: 2975 [Urine:2975] Intake/Output this shift:    Labs:  Recent Labs  02/19/15 0642  HGB 13.1    Recent Labs  02/19/15 0642  WBC 19.2*  RBC 4.42  HCT 39.8  PLT 286    Recent Labs  02/19/15 0642  NA 136  K 4.7  CL 101  CO2 27  BUN 27*  CREATININE 1.38*  GLUCOSE 153*  CALCIUM 8.8*   No results for input(s): LABPT, INR in the last 72 hours.  Physical Exam: ABD soft Compartment soft splint in good condition  Assessment/Plan: 2 Days Post-Op Procedure(s) (LRB): OPEN REDUCTION INTERNAL FIXATION (ORIF) RIGHT CALCANEOUS FRACTURE (Right) Advance diet Up with therapy Discharge to SNF when bed ready  Averlee Swartz D for Dr. Melina Schools [facility] [phone] 02/21/2015, 8:34 AM

## 2015-02-22 DIAGNOSIS — E669 Obesity, unspecified: Secondary | ICD-10-CM | POA: Diagnosis not present

## 2015-02-22 DIAGNOSIS — M5417 Radiculopathy, lumbosacral region: Secondary | ICD-10-CM | POA: Diagnosis not present

## 2015-02-22 DIAGNOSIS — Z9181 History of falling: Secondary | ICD-10-CM | POA: Diagnosis not present

## 2015-02-22 DIAGNOSIS — S82844D Nondisplaced bimalleolar fracture of right lower leg, subsequent encounter for closed fracture with routine healing: Secondary | ICD-10-CM | POA: Diagnosis not present

## 2015-02-22 DIAGNOSIS — S92001A Unspecified fracture of right calcaneus, initial encounter for closed fracture: Secondary | ICD-10-CM | POA: Diagnosis not present

## 2015-02-22 DIAGNOSIS — K5900 Constipation, unspecified: Secondary | ICD-10-CM | POA: Diagnosis not present

## 2015-02-22 DIAGNOSIS — M519 Unspecified thoracic, thoracolumbar and lumbosacral intervertebral disc disorder: Secondary | ICD-10-CM | POA: Diagnosis not present

## 2015-02-22 DIAGNOSIS — S92044D Nondisplaced other fracture of tuberosity of right calcaneus, subsequent encounter for fracture with routine healing: Secondary | ICD-10-CM | POA: Diagnosis not present

## 2015-02-22 DIAGNOSIS — Z4789 Encounter for other orthopedic aftercare: Secondary | ICD-10-CM | POA: Diagnosis not present

## 2015-02-22 DIAGNOSIS — R279 Unspecified lack of coordination: Secondary | ICD-10-CM | POA: Diagnosis not present

## 2015-02-22 DIAGNOSIS — B9681 Helicobacter pylori [H. pylori] as the cause of diseases classified elsewhere: Secondary | ICD-10-CM | POA: Diagnosis not present

## 2015-02-22 DIAGNOSIS — F0390 Unspecified dementia without behavioral disturbance: Secondary | ICD-10-CM | POA: Diagnosis not present

## 2015-02-22 DIAGNOSIS — E1140 Type 2 diabetes mellitus with diabetic neuropathy, unspecified: Secondary | ICD-10-CM | POA: Diagnosis not present

## 2015-02-22 DIAGNOSIS — M21271 Flexion deformity, right ankle and toes: Secondary | ICD-10-CM | POA: Diagnosis not present

## 2015-02-22 DIAGNOSIS — K269 Duodenal ulcer, unspecified as acute or chronic, without hemorrhage or perforation: Secondary | ICD-10-CM | POA: Diagnosis not present

## 2015-02-22 DIAGNOSIS — R739 Hyperglycemia, unspecified: Secondary | ICD-10-CM | POA: Diagnosis not present

## 2015-02-22 DIAGNOSIS — M6281 Muscle weakness (generalized): Secondary | ICD-10-CM | POA: Diagnosis not present

## 2015-02-22 DIAGNOSIS — S92001D Unspecified fracture of right calcaneus, subsequent encounter for fracture with routine healing: Secondary | ICD-10-CM | POA: Diagnosis not present

## 2015-02-22 DIAGNOSIS — I10 Essential (primary) hypertension: Secondary | ICD-10-CM | POA: Diagnosis not present

## 2015-02-22 DIAGNOSIS — E119 Type 2 diabetes mellitus without complications: Secondary | ICD-10-CM | POA: Diagnosis not present

## 2015-02-22 LAB — GLUCOSE, CAPILLARY
GLUCOSE-CAPILLARY: 161 mg/dL — AB (ref 65–99)
Glucose-Capillary: 140 mg/dL — ABNORMAL HIGH (ref 65–99)

## 2015-02-22 NOTE — Progress Notes (Signed)
Pt to be d/c today to [facility].   Pt and family agreeable. Confirmed plans with facility.  Plan transfer via EMS.     [facility]  2S, 47M,3S, 5N, 6N [phone]

## 2015-02-22 NOTE — Clinical Social Work Placement (Signed)
   CLINICAL SOCIAL WORK PLACEMENT  NOTE  Date:  02/22/2015  Patient Details  Name: [name] MRN: [id number] Date of Birth: 04-Jun-1932  Clinical Social Work is seeking post-discharge placement for this patient at the [place] level of care (*CSW will initial, date and re-position this form in  chart as items are completed):  Yes   Patient/family provided with [place] Work Department's list of facilities offering this level of care within the geographic area requested by the patient (or if unable, by the patient's family).  Yes   Patient/family informed of their freedom to choose among providers that offer the needed level of care, that participate in Medicare, Medicaid or managed care program needed by the patient, have an available bed and are willing to accept the patient.  Yes   Patient/family informed of [facility]'s ownership interest in [facility] and [facility], as well as of the fact that they are under no obligation to receive care at these facilities.  PASRR submitted to EDS on 02/20/15     PASRR number received on 02/20/15     Existing PASRR number confirmed on  (n/a)     FL2 transmitted to all facilities in geographic area requested by pt/family on 02/20/15     FL2 transmitted to all facilities within larger geographic area on  (n/a)     Patient informed that his/her managed care company has contracts with or will negotiate with certain facilities, including the following:   (yes, [facility])     Yes   Patient/family informed of bed offers received.  Patient chooses bed at [facility]     Physician recommends and patient chooses bed at      Patient to be transferred to [facility] on 02/22/15.  Patient to be transferred to facility by [facility]     Patient family notified on 02/22/15 of transfer.  Name of family member notified:  Pt notified family     PHYSICIAN Please sign FL2      Additional Comment:    _______________________________________________ Pete Pelt 02/22/2015, 12:07 PM

## 2015-02-22 NOTE — Progress Notes (Signed)
   Subjective: 3 Days Post-Op Procedure(s) (LRB): OPEN REDUCTION INTERNAL FIXATION (ORIF) RIGHT CALCANEOUS FRACTURE (Right) Patient reports pain as mild.   Patient seen in rounds with Dr. Wynelle Link. Patient is well, and has had no acute complaints or problems. No issues overnight. Reports that he is ready for DC to SNF today.    Objective: Vital signs in last 24 hours: Temp:  [98.1 F (36.7 C)-98.3 F (36.8 C)] 98.1 F (36.7 C) (06/26 0628) Pulse Rate:  [80-86] 82 (06/26 0628) Resp:  [18-20] 18 (06/26 0628) BP: (99-136)/(52-82) 136/77 mmHg (06/26 0628) SpO2:  [95 %-98 %] 95 % (06/26 0628)  Intake/Output from previous day:  Intake/Output Summary (Last 24 hours) at 02/22/15 0752 Last data filed at 02/21/15 0939  Gross per 24 hour  Intake    240 ml  Output      0 ml  Net    240 ml     EXAM General - Patient is Alert and Oriented Extremity - Neurologically intact Dressing/Incision - clean, dry Motor Function - intact, moving foot and toes well on exam.   Past Medical History  Diagnosis Date  . Diabetes mellitus without complication   . Post traumatic stress disorder (PTSD)     [place] War  . Hypercholesteremia   . Gastric perforation   . Wears hearing aid     both ears  . Full dentures   . Wears glasses   . Arthritis   . GERD (gastroesophageal reflux disease)   . Chronic back pain   . Sleep apnea     does not  use cpap  . Pneumonia     1960's  . Constipation due to opioid therapy   . Hypertension     Pt is not aware that he is treated for HTN, found it listed in "Problem list"    Assessment/Plan: 3 Days Post-Op Procedure(s) (LRB): OPEN REDUCTION INTERNAL FIXATION (ORIF) RIGHT CALCANEOUS FRACTURE (Right) Active Problems:   Closed displaced bimalleolar fracture of right ankle   Bimalleolar ankle fracture  Estimated body mass index is 41.86 kg/(m^2) as calculated from the following:   Height as of this encounter: 5\' 11"  (1.803 m).   Weight as of this  encounter: 136.079 kg (300 lb). Advance diet Up with therapy D/C IV fluids Discharge to SNF  DVT Prophylaxis - Lovenox  Doing well. DC to SNF today. NWB right LE.  Ardeen Jourdain,  PA-C Orthopaedic Surgery 02/22/2015, 7:52 AM

## 2015-02-27 DIAGNOSIS — K5900 Constipation, unspecified: Secondary | ICD-10-CM | POA: Diagnosis not present

## 2015-02-27 DIAGNOSIS — M519 Unspecified thoracic, thoracolumbar and lumbosacral intervertebral disc disorder: Secondary | ICD-10-CM | POA: Diagnosis not present

## 2015-02-27 DIAGNOSIS — E1140 Type 2 diabetes mellitus with diabetic neuropathy, unspecified: Secondary | ICD-10-CM | POA: Diagnosis not present

## 2015-02-27 DIAGNOSIS — F0390 Unspecified dementia without behavioral disturbance: Secondary | ICD-10-CM | POA: Diagnosis not present

## 2015-02-27 DIAGNOSIS — R739 Hyperglycemia, unspecified: Secondary | ICD-10-CM | POA: Diagnosis not present

## 2015-02-27 DIAGNOSIS — S92001A Unspecified fracture of right calcaneus, initial encounter for closed fracture: Secondary | ICD-10-CM | POA: Diagnosis not present

## 2015-02-27 DIAGNOSIS — I10 Essential (primary) hypertension: Secondary | ICD-10-CM | POA: Diagnosis not present

## 2015-03-05 DIAGNOSIS — Z4789 Encounter for other orthopedic aftercare: Secondary | ICD-10-CM | POA: Diagnosis not present

## 2015-03-05 DIAGNOSIS — S92044D Nondisplaced other fracture of tuberosity of right calcaneus, subsequent encounter for fracture with routine healing: Secondary | ICD-10-CM | POA: Diagnosis not present

## 2015-03-18 ENCOUNTER — Ambulatory Visit: Payer: Medicare Other | Admitting: Podiatry

## 2015-04-03 DIAGNOSIS — L600 Ingrowing nail: Secondary | ICD-10-CM | POA: Diagnosis not present

## 2015-04-03 DIAGNOSIS — S92044D Nondisplaced other fracture of tuberosity of right calcaneus, subsequent encounter for fracture with routine healing: Secondary | ICD-10-CM | POA: Diagnosis not present

## 2015-04-10 ENCOUNTER — Telehealth: Payer: Self-pay | Admitting: *Deleted

## 2015-04-10 NOTE — Telephone Encounter (Signed)
Pt states he needs to change his 1000am appt, he has an appt at the [place].

## 2015-04-20 ENCOUNTER — Ambulatory Visit: Payer: Medicare Other | Admitting: Podiatry

## 2015-04-21 ENCOUNTER — Encounter: Payer: Self-pay | Admitting: Podiatry

## 2015-04-21 ENCOUNTER — Ambulatory Visit (INDEPENDENT_AMBULATORY_CARE_PROVIDER_SITE_OTHER): Payer: Medicare Other | Admitting: Podiatry

## 2015-04-21 VITALS — BP 148/65 | HR 78 | Resp 16

## 2015-04-21 DIAGNOSIS — L600 Ingrowing nail: Secondary | ICD-10-CM

## 2015-04-21 NOTE — Progress Notes (Signed)
   Subjective:    Patient ID: [name], male    DOB: [birth year], 81 y.o.   MRN: [id number]  HPI Pt presents with bilateral hallux ingrown nails   Review of Systems  All other systems reviewed and are negative.      Objective:   Physical Exam        Assessment & Plan:

## 2015-04-21 NOTE — Patient Instructions (Signed)

## 2015-04-22 ENCOUNTER — Telehealth: Payer: Self-pay | Admitting: *Deleted

## 2015-04-22 NOTE — Telephone Encounter (Signed)
Called patient at [phone] (Home #) to check to see how they were doing from their ingrown toenail procedure that was performed on 04/21/15. Pt stated, "Hurt a little bit during the night, but feels fine today".

## 2015-04-24 NOTE — Progress Notes (Signed)
Subjective:     Patient ID: [name], male   DOB: 1932/04/01, 81 y.o.   MRN: [id number]  HPI patient presents with ingrown toenails of both big toes medial border and states he can no longer cut them trim them and even when cut by the doctor it does not help   Review of Systems     Objective:   Physical Exam Neurovascular status was found to be intact with adequate PT and DP pulses and good digital perfusion noted to the digits. Patient has normal cap fill time and hair growth and has severely incurvated medial borders of each hallux nail that are tender when pressed    Assessment:     Ingrown toenail deformity hallux bilateral    Plan:     Reviewed condition and discussed treatment options. Patient wants it fixed and I explained risk of surgery and he is willing to accept risk and today I infiltrated each hallux 60 mg Xylocaine Marcaine mixture remove the borders exposed matrix and applied phenol 3 applications 30 seconds to each border followed by alcohol lavage and sterile dressing. Gave instructions on soaks and reappoint

## 2015-05-01 DIAGNOSIS — S92044D Nondisplaced other fracture of tuberosity of right calcaneus, subsequent encounter for fracture with routine healing: Secondary | ICD-10-CM | POA: Diagnosis not present

## 2015-06-22 DIAGNOSIS — G4700 Insomnia, unspecified: Secondary | ICD-10-CM | POA: Diagnosis not present

## 2015-06-22 DIAGNOSIS — E1149 Type 2 diabetes mellitus with other diabetic neurological complication: Secondary | ICD-10-CM | POA: Diagnosis not present

## 2015-06-22 DIAGNOSIS — E7800 Pure hypercholesterolemia, unspecified: Secondary | ICD-10-CM | POA: Diagnosis not present

## 2015-06-22 DIAGNOSIS — I10 Essential (primary) hypertension: Secondary | ICD-10-CM | POA: Diagnosis not present

## 2015-06-22 DIAGNOSIS — E1142 Type 2 diabetes mellitus with diabetic polyneuropathy: Secondary | ICD-10-CM | POA: Diagnosis not present

## 2015-06-22 DIAGNOSIS — E559 Vitamin D deficiency, unspecified: Secondary | ICD-10-CM | POA: Diagnosis not present

## 2015-06-22 DIAGNOSIS — Z23 Encounter for immunization: Secondary | ICD-10-CM | POA: Diagnosis not present

## 2015-06-22 DIAGNOSIS — E538 Deficiency of other specified B group vitamins: Secondary | ICD-10-CM | POA: Diagnosis not present

## 2015-06-22 DIAGNOSIS — K219 Gastro-esophageal reflux disease without esophagitis: Secondary | ICD-10-CM | POA: Diagnosis not present

## 2015-06-30 ENCOUNTER — Encounter: Payer: Self-pay | Admitting: Podiatry

## 2015-06-30 ENCOUNTER — Ambulatory Visit (INDEPENDENT_AMBULATORY_CARE_PROVIDER_SITE_OTHER): Payer: Medicare Other | Admitting: Podiatry

## 2015-06-30 VITALS — BP 128/60 | HR 86 | Resp 16

## 2015-06-30 DIAGNOSIS — M779 Enthesopathy, unspecified: Secondary | ICD-10-CM | POA: Diagnosis not present

## 2015-07-03 DIAGNOSIS — M25561 Pain in right knee: Secondary | ICD-10-CM | POA: Diagnosis not present

## 2015-07-03 DIAGNOSIS — S92044D Nondisplaced other fracture of tuberosity of right calcaneus, subsequent encounter for fracture with routine healing: Secondary | ICD-10-CM | POA: Diagnosis not present

## 2015-07-03 NOTE — Progress Notes (Signed)
Subjective:     Patient ID: [name], male   DOB: [birth year], 81 y.o.   MRN: [id number]  HPI  Patient presents with wife stating that he needs new diabetic shoes and he needs consistent care of his feet   Review of Systems     Objective:   Physical Exam  neurovascular status diminished but unchanged from previous visit with lesion formation digital deformities and cavus foot structural deformity along with brace left secondary to anterior tibial dysfunction    Assessment:      patient noted to have damaged foot structure and bracing    Plan:      advised patient on the continuation of conservative care and I'm sending him to [facility] was been making his braces successfully. We will continue local care is needed and I educated him on this

## 2015-08-07 DIAGNOSIS — M1711 Unilateral primary osteoarthritis, right knee: Secondary | ICD-10-CM | POA: Diagnosis not present

## 2015-08-07 DIAGNOSIS — M25561 Pain in right knee: Secondary | ICD-10-CM | POA: Diagnosis not present

## 2015-08-07 DIAGNOSIS — M25461 Effusion, right knee: Secondary | ICD-10-CM | POA: Diagnosis not present

## 2015-10-11 LAB — HM DIABETES EYE EXAM

## 2015-11-04 DIAGNOSIS — G894 Chronic pain syndrome: Secondary | ICD-10-CM | POA: Diagnosis not present

## 2015-11-04 DIAGNOSIS — G4709 Other insomnia: Secondary | ICD-10-CM | POA: Diagnosis not present

## 2015-11-04 DIAGNOSIS — E1140 Type 2 diabetes mellitus with diabetic neuropathy, unspecified: Secondary | ICD-10-CM | POA: Diagnosis not present

## 2015-11-04 DIAGNOSIS — G4733 Obstructive sleep apnea (adult) (pediatric): Secondary | ICD-10-CM | POA: Diagnosis not present

## 2015-11-04 DIAGNOSIS — F4310 Post-traumatic stress disorder, unspecified: Secondary | ICD-10-CM | POA: Diagnosis not present

## 2015-11-04 DIAGNOSIS — R2689 Other abnormalities of gait and mobility: Secondary | ICD-10-CM | POA: Diagnosis not present

## 2015-11-04 DIAGNOSIS — E1149 Type 2 diabetes mellitus with other diabetic neurological complication: Secondary | ICD-10-CM | POA: Diagnosis not present

## 2015-11-20 ENCOUNTER — Emergency Department (HOSPITAL_COMMUNITY): Payer: Medicare Other

## 2015-11-20 ENCOUNTER — Emergency Department (HOSPITAL_COMMUNITY)
Admission: EM | Admit: 2015-11-20 | Discharge: 2015-11-20 | Disposition: A | Discharge status: Home / Self Care | Payer: Medicare Other | Attending: Emergency Medicine | Admitting: Emergency Medicine

## 2015-11-20 ENCOUNTER — Encounter (HOSPITAL_COMMUNITY): Payer: Self-pay

## 2015-11-20 DIAGNOSIS — S93402A Sprain of unspecified ligament of left ankle, initial encounter: Secondary | ICD-10-CM | POA: Diagnosis not present

## 2015-11-20 DIAGNOSIS — M1990 Unspecified osteoarthritis, unspecified site: Secondary | ICD-10-CM | POA: Insufficient documentation

## 2015-11-20 DIAGNOSIS — Z7982 Long term (current) use of aspirin: Secondary | ICD-10-CM | POA: Insufficient documentation

## 2015-11-20 DIAGNOSIS — E119 Type 2 diabetes mellitus without complications: Secondary | ICD-10-CM | POA: Diagnosis not present

## 2015-11-20 DIAGNOSIS — W19XXXA Unspecified fall, initial encounter: Secondary | ICD-10-CM

## 2015-11-20 DIAGNOSIS — M25562 Pain in left knee: Secondary | ICD-10-CM | POA: Diagnosis not present

## 2015-11-20 DIAGNOSIS — Z794 Long term (current) use of insulin: Secondary | ICD-10-CM | POA: Diagnosis not present

## 2015-11-20 DIAGNOSIS — T402X5A Adverse effect of other opioids, initial encounter: Secondary | ICD-10-CM | POA: Diagnosis not present

## 2015-11-20 DIAGNOSIS — Z79899 Other long term (current) drug therapy: Secondary | ICD-10-CM | POA: Diagnosis not present

## 2015-11-20 DIAGNOSIS — G8929 Other chronic pain: Secondary | ICD-10-CM | POA: Insufficient documentation

## 2015-11-20 DIAGNOSIS — Z8701 Personal history of pneumonia (recurrent): Secondary | ICD-10-CM | POA: Insufficient documentation

## 2015-11-20 DIAGNOSIS — E7800 Pure hypercholesterolemia, unspecified: Secondary | ICD-10-CM | POA: Diagnosis not present

## 2015-11-20 DIAGNOSIS — K219 Gastro-esophageal reflux disease without esophagitis: Secondary | ICD-10-CM | POA: Insufficient documentation

## 2015-11-20 DIAGNOSIS — Y998 Other external cause status: Secondary | ICD-10-CM | POA: Insufficient documentation

## 2015-11-20 DIAGNOSIS — Y9289 Other specified places as the place of occurrence of the external cause: Secondary | ICD-10-CM | POA: Insufficient documentation

## 2015-11-20 DIAGNOSIS — Y9389 Activity, other specified: Secondary | ICD-10-CM | POA: Insufficient documentation

## 2015-11-20 DIAGNOSIS — S8002XA Contusion of left knee, initial encounter: Secondary | ICD-10-CM | POA: Insufficient documentation

## 2015-11-20 DIAGNOSIS — F4310 Post-traumatic stress disorder, unspecified: Secondary | ICD-10-CM | POA: Insufficient documentation

## 2015-11-20 DIAGNOSIS — I10 Essential (primary) hypertension: Secondary | ICD-10-CM | POA: Diagnosis not present

## 2015-11-20 DIAGNOSIS — W108XXA Fall (on) (from) other stairs and steps, initial encounter: Secondary | ICD-10-CM | POA: Insufficient documentation

## 2015-11-20 DIAGNOSIS — K5903 Drug induced constipation: Secondary | ICD-10-CM | POA: Diagnosis not present

## 2015-11-20 DIAGNOSIS — S99912A Unspecified injury of left ankle, initial encounter: Secondary | ICD-10-CM | POA: Diagnosis not present

## 2015-11-20 DIAGNOSIS — M7989 Other specified soft tissue disorders: Secondary | ICD-10-CM | POA: Diagnosis not present

## 2015-11-20 DIAGNOSIS — S93492A Sprain of other ligament of left ankle, initial encounter: Secondary | ICD-10-CM | POA: Diagnosis not present

## 2015-11-20 NOTE — ED Notes (Signed)
Pt states he does not want any medication for pain at this time.

## 2015-11-20 NOTE — ED Notes (Signed)
Anti-slip stockings applied to pt. Pt ambulated with a walker approx 5 feet. Pt refused to walk further stating he wants to leave. No noted difficulty with the ambulation. Provider notified of pt's request.

## 2015-11-20 NOTE — ED Notes (Signed)
Ice pack not applied due to pt having peripheral vascular disease. +1 pedal pulses noted bilaterally.

## 2015-11-20 NOTE — ED Notes (Signed)
Pt present with NAD. Pt states legs gave out. ? Falling down approx 3 steps. No LOC. Denies neck or back pain. C/o of left ankle and left knee pain

## 2015-11-20 NOTE — ED Notes (Signed)
ACE wrap applied to pt's L ankle and L knee.

## 2015-11-20 NOTE — Discharge Instructions (Signed)
Continue your pain medications at home. Please be careful ambulating. Keep your leg elevated. Ice several times a day. Please follow up with orthopedics specialist for recheck, physical therapy, and further treatment of your knee and ankle.    Ankle Sprain An ankle sprain is an injury to the strong, fibrous tissues (ligaments) that hold the bones of your ankle joint together.  CAUSES An ankle sprain is usually caused by a fall or by twisting your ankle. Ankle sprains most commonly occur when you step on the outer edge of your foot, and your ankle turns inward. People who participate in sports are more prone to these types of injuries.  SYMPTOMS   Pain in your ankle. The pain may be present at rest or only when you are trying to stand or walk.  Swelling.  Bruising. Bruising may develop immediately or within 1 to 2 days after your injury.  Difficulty standing or walking, particularly when turning corners or changing directions. DIAGNOSIS  Your caregiver will ask you details about your injury and perform a physical exam of your ankle to determine if you have an ankle sprain. During the physical exam, your caregiver will press on and apply pressure to specific areas of your foot and ankle. Your caregiver will try to move your ankle in certain ways. An X-ray exam may be done to be sure a bone was not broken or a ligament did not separate from one of the bones in your ankle (avulsion fracture).  TREATMENT  Certain types of braces can help stabilize your ankle. Your caregiver can make a recommendation for this. Your caregiver may recommend the use of medicine for pain. If your sprain is severe, your caregiver may refer you to a surgeon who helps to restore function to parts of your skeletal system (orthopedist) or a physical therapist. [place] ice to your injury for 1-2 days or as directed by your caregiver. Applying ice helps to reduce inflammation and pain.  Put ice in a  plastic bag.  Place a towel between your skin and the bag.  Leave the ice on for 15-20 minutes at a time, every 2 hours while you are awake.  Only take over-the-counter or prescription medicines for pain, discomfort, or fever as directed by your caregiver.  Elevate your injured ankle above the level of your heart as much as possible for 2-3 days.  If your caregiver recommends crutches, use them as instructed. Gradually put weight on the affected ankle. Continue to use crutches or a cane until you can walk without feeling pain in your ankle.  If you have a plaster splint, wear the splint as directed by your caregiver. Do not rest it on anything harder than a pillow for the first 24 hours. Do not put weight on it. Do not get it wet. You may take it off to take a shower or bath.  You may have been given an elastic bandage to wear around your ankle to provide support. If the elastic bandage is too tight (you have numbness or tingling in your foot or your foot becomes cold and blue), adjust the bandage to make it comfortable.  If you have an air splint, you may blow more air into it or let air out to make it more comfortable. You may take your splint off at night and before taking a shower or bath. Wiggle your toes in the splint several times per day to decrease swelling. SEEK MEDICAL CARE IF:  You have rapidly increasing bruising or swelling.  Your toes feel extremely cold or you lose feeling in your foot.  Your pain is not relieved with medicine. SEEK IMMEDIATE MEDICAL CARE IF:  Your toes are numb or blue.  You have severe pain that is increasing. MAKE SURE YOU:   Understand these instructions.  Will watch your condition.  Will get help right away if you are not doing well or get worse.   This information is not intended to replace advice given to you by your health care provider. Make sure you discuss any questions you have with your health care provider.   Document Released:  08/15/2005 Document Revised: 09/05/2014 Document Reviewed: 08/27/2011 Elsevier Interactive Patient Education 2016 Elsevier Inc.  Knee Pain Knee pain is a very common symptom and can have many causes. Knee pain often goes away when you follow your health care provider's instructions for relieving pain and discomfort at home. However, knee pain can develop into a condition that needs treatment. Some conditions may include:  Arthritis caused by wear and tear (osteoarthritis).  Arthritis caused by swelling and irritation (rheumatoid arthritis or gout).  A cyst or growth in your knee.  An infection in your knee joint.  An injury that will not heal.  Damage, swelling, or irritation of the tissues that support your knee (torn ligaments or tendinitis). If your knee pain continues, additional tests may be ordered to diagnose your condition. Tests may include X-rays or other imaging studies of your knee. You may also need to have fluid removed from your knee. Treatment for ongoing knee pain depends on the cause, but treatment may include:  Medicines to relieve pain or swelling.  Steroid injections in your knee.  Physical therapy.  Surgery. HOME CARE INSTRUCTIONS  Take medicines only as directed by your health care provider.  Rest your knee and keep it raised (elevated) while you are resting.  Do not do things that cause or worsen pain.  Avoid high-impact activities or exercises, such as running, jumping rope, or doing jumping jacks.  Apply ice to the knee area:  Put ice in a plastic bag.  Place a towel between your skin and the bag.  Leave the ice on for 20 minutes, 2-3 times a day.  Ask your health care provider if you should wear an elastic knee support.  Keep a pillow under your knee when you sleep.  Lose weight if you are overweight. Extra weight can put pressure on your knee.  Do not use any tobacco products, including cigarettes, chewing tobacco, or electronic  cigarettes. If you need help quitting, ask your health care provider. Smoking may slow the healing of any bone and joint problems that you may have. SEEK MEDICAL CARE IF:  Your knee pain continues, changes, or gets worse.  You have a fever along with knee pain.  Your knee buckles or locks up.  Your knee becomes more swollen. SEEK IMMEDIATE MEDICAL CARE IF:   Your knee joint feels hot to the touch.  You have chest pain or trouble breathing.   This information is not intended to replace advice given to you by your health care provider. Make sure you discuss any questions you have with your health care provider.   Document Released: 06/12/2007 Document Revised: 09/05/2014 Document Reviewed: 03/31/2014 Elsevier Interactive Patient Education 2016 [employer].  Respiratory failure is when your lungs are not working well and your breathing (respiratory) system fails. When respiratory failure occurs, it is difficult for your  lungs to get enough oxygen, get rid of carbon dioxide, or both. Respiratory failure can be life threatening.  Respiratory failure can be acute or chronic. Acute respiratory failure is sudden, severe, and requires emergency medical treatment. Chronic respiratory failure is less severe, happens over time, and requires ongoing treatment.  WHAT ARE THE CAUSES OF ACUTE RESPIRATORY FAILURE?  Any problem affecting the heart or lungs can cause acute respiratory failure. Some of these causes include the following:  Chronic bronchitis and emphysema (COPD).   Blood clot going to a lung (pulmonary embolism).   Having water in the lungs caused by heart failure, lung injury, or infection (pulmonary edema).   Collapsed lung (pneumothorax).   Pneumonia.   Pulmonary fibrosis.   Obesity.   Asthma.   Heart failure.   Any type of trauma to the chest that can make breathing difficult.   Nerve or muscle diseases making chest movements difficult. HOW WILL MY ACUTE  RESPIRATORY FAILURE BE TREATED?  Treatment of acute respiratory failure depends on the cause of the respiratory failure. Usually, you will stay in the intensive care unit so your breathing can be watched closely. Treatment can include the following:  Oxygen. Oxygen can be delivered through the following:  Nasal cannula. This is small tubing that goes in your nose to give you oxygen.  Face mask. A face mask covers your nose and mouth to give you oxygen.  Medicine. Different medicines can be given to help with breathing. These can include:  Nebulizers. Nebulizers deliver medicines to open the air passages (bronchodilators). These medicines help to open or relax the airways in the lungs so you can breathe better. They can also help loosen mucus from your lungs.  Diuretics. Diuretic medicines can help you breathe better by getting rid of extra water in your body.  Steroids. Steroid medicines can help decrease swelling (inflammation) in your lungs.  Antibiotics.  Chest tube. If you have a collapsed lung (pneumothorax), a chest tube is placed to help reinflate the lung.  Noninvasive positive pressure ventilation (NPPV). This is a tight-fitting mask that goes over your nose and mouth. The mask has tubing that is attached to a machine. The machine blows air into the tubing, which helps to keep the tiny air sacs (alveoli) in your lungs open. This machine allows you to breathe on your own.  Ventilator. A ventilator is a breathing machine. When on a ventilator, a breathing tube is put into the lungs. A ventilator is used when you can no longer breathe well enough on your own. You may have low oxygen levels or high carbon dioxide (CO2) levels in your blood. When you are on a ventilator, sedation and pain medicines are given to make you sleep so your lungs can heal. SEEK IMMEDIATE MEDICAL CARE IF:  You have shortness of breath (dyspnea) with or without activity.  You have rapid breathing  (tachypnea).  You are wheezing.  You are unable to say more than a few words without having to catch your breath.  You find it very difficult to function normally.  You have a fast heart rate.  You have a bluish color to your finger or toe nail beds.  You have confusion or drowsiness or both.   This information is not intended to replace advice given to you by your health care provider. Make sure you discuss any questions you have with your health care provider.   Document Released: 08/20/2013 Document Revised: 05/06/2015 Document Reviewed: 08/20/2013 Elsevier Interactive Patient Education  2016 [place] Prevention in the Home  Falls can cause injuries and can affect people from all age groups. There are many simple things that you can do to make your home safe and to help prevent falls. WHAT CAN I DO ON THE OUTSIDE OF MY HOME?  Regularly repair the edges of walkways and driveways and fix any cracks.  Remove high doorway thresholds.  Trim any shrubbery on the main path into your home.  Use bright outdoor lighting.  Clear walkways of debris and clutter, including tools and rocks.  Regularly check that handrails are securely fastened and in good repair. Both sides of any steps should have handrails.  Install guardrails along the edges of any raised decks or porches.  Have leaves, snow, and ice cleared regularly.  Use sand or salt on walkways during winter months.  In the garage, clean up any spills right away, including grease or oil spills. WHAT CAN I DO IN THE BATHROOM?  Use night lights.  Install grab bars by the toilet and in the tub and shower. Do not use towel bars as grab bars.  Use non-skid mats or decals on the floor of the tub or shower.  If you need to sit down while you are in the shower, use a plastic, non-slip stool.[place]  Keep the floor dry. Immediately clean up any water that spills on the floor.  Remove soap buildup in the tub or shower on  a regular basis.  Attach bath mats securely with double-sided non-slip rug tape.  Remove throw rugs and other tripping hazards from the floor. WHAT CAN I DO IN THE BEDROOM?  Use night lights.  Make sure that a bedside light is easy to reach.  Do not use oversized bedding that drapes onto the floor.  Have a firm chair that has side arms to use for getting dressed.  Remove throw rugs and other tripping hazards from the floor. WHAT CAN I DO IN THE KITCHEN?   Clean up any spills right away.  Avoid walking on wet floors.  Place frequently used items in easy-to-reach places.  If you need to reach for something above you, use a sturdy step stool that has a grab bar.  Keep electrical cables out of the way.  Do not use floor polish or wax that makes floors slippery. If you have to use wax, make sure that it is non-skid floor wax.  Remove throw rugs and other tripping hazards from the floor. WHAT CAN I DO IN THE STAIRWAYS?  Do not leave any items on the stairs.  Make sure that there are handrails on both sides of the stairs. Fix handrails that are broken or loose. Make sure that handrails are as long as the stairways.  Check any carpeting to make sure that it is firmly attached to the stairs. Fix any carpet that is loose or worn.  Avoid having throw rugs at the top or bottom of stairways, or secure the rugs with carpet tape to prevent them from moving.  Make sure that you have a light switch at the top of the stairs and the bottom of the stairs. If you do not have them, have them installed. WHAT ARE SOME OTHER FALL PREVENTION TIPS?  Wear closed-toe shoes that fit well and support your feet. Wear shoes that have rubber soles or low heels.  When you use a stepladder, make sure that it is completely opened and that the sides are firmly locked. Have someone  hold the ladder while you are using it. Do not climb a closed stepladder.  Add color or contrast paint or tape to grab bars and  handrails in your home. Place contrasting color strips on the first and last steps.  Use mobility aids as needed, such as canes, walkers, scooters, and crutches.  Turn on lights if it is dark. Replace any light bulbs that burn out.  Set up furniture so that there are clear paths. Keep the furniture in the same spot.  Fix any uneven floor surfaces.  Choose a carpet design that does not hide the edge of steps of a stairway.  Be aware of any and all pets.  Review your medicines with your healthcare provider. Some medicines can cause dizziness or changes in blood pressure, which increase your risk of falling. Talk with your health care provider about other ways that you can decrease your risk of falls. This may include working with a physical therapist or trainer to improve your strength, balance, and endurance.   This information is not intended to replace advice given to you by your health care provider. Make sure you discuss any questions you have with your health care provider.   Document Released: 08/05/2002 Document Revised: 12/30/2014 Document Reviewed: 09/19/2014 Elsevier Interactive Patient Education [employer].

## 2015-11-20 NOTE — Progress Notes (Addendum)
82 yr old medicare pt with mechanical fall this am coming down stairs in split level home on his way per pt to let his dog out. Reports his living room and entry to home is on the first level On the first level is no bedroom or bathroom.  All bedrooms and bathrooms are on the second level.  Son at bedside  CM Consulted by ED NP/PA to see if there are possible resources for pt  Cm spoke with pt and son Pt with hearing aid in left ear Pt informed Cm his left leg went backwards coming down carpeted stairs as he was using the stair rails Pt alert and oriented to person, place, time, situation. Pt has wife that is hospitalized at [facility] and wants to visit her prior to leaving [facility]. Son confirms he is the POA Cm explained pt is alert and oriented and POA status not active unless pt is not oriented.   Pt was previously at [facility] and had home health services after d/c but refuses the offer of HHPT to check home for safety at this time Reports having DME at home to include a motorized scooter, walker Pt plan he states is for he and his wife once d/c from [place] to get a one level apartment to prevent having to climb stairs and injury to him or her. 

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
## 2015-11-23 DIAGNOSIS — M25562 Pain in left knee: Secondary | ICD-10-CM | POA: Diagnosis not present

## 2015-11-23 DIAGNOSIS — E119 Type 2 diabetes mellitus without complications: Secondary | ICD-10-CM | POA: Diagnosis not present

## 2015-11-23 DIAGNOSIS — I10 Essential (primary) hypertension: Secondary | ICD-10-CM | POA: Diagnosis not present

## 2015-11-23 DIAGNOSIS — S93402S Sprain of unspecified ligament of left ankle, sequela: Secondary | ICD-10-CM | POA: Diagnosis not present

## 2015-11-23 DIAGNOSIS — M1990 Unspecified osteoarthritis, unspecified site: Secondary | ICD-10-CM | POA: Diagnosis not present

## 2015-11-23 DIAGNOSIS — M6281 Muscle weakness (generalized): Secondary | ICD-10-CM | POA: Diagnosis not present

## 2015-11-24 ENCOUNTER — Institutional Professional Consult (permissible substitution): Payer: TRICARE For Life (TFL) | Admitting: Neurology

## 2015-11-24 DIAGNOSIS — E559 Vitamin D deficiency, unspecified: Secondary | ICD-10-CM | POA: Diagnosis not present

## 2015-11-24 DIAGNOSIS — M6281 Muscle weakness (generalized): Secondary | ICD-10-CM | POA: Diagnosis not present

## 2015-11-24 DIAGNOSIS — Z79899 Other long term (current) drug therapy: Secondary | ICD-10-CM | POA: Diagnosis not present

## 2015-11-24 DIAGNOSIS — S93402S Sprain of unspecified ligament of left ankle, sequela: Secondary | ICD-10-CM | POA: Diagnosis not present

## 2015-11-24 DIAGNOSIS — M25562 Pain in left knee: Secondary | ICD-10-CM | POA: Diagnosis not present

## 2015-11-24 DIAGNOSIS — E785 Hyperlipidemia, unspecified: Secondary | ICD-10-CM | POA: Diagnosis not present

## 2015-11-24 DIAGNOSIS — D649 Anemia, unspecified: Secondary | ICD-10-CM | POA: Diagnosis not present

## 2015-11-24 DIAGNOSIS — E039 Hypothyroidism, unspecified: Secondary | ICD-10-CM | POA: Diagnosis not present

## 2015-11-24 DIAGNOSIS — E119 Type 2 diabetes mellitus without complications: Secondary | ICD-10-CM | POA: Diagnosis not present

## 2015-11-24 DIAGNOSIS — I10 Essential (primary) hypertension: Secondary | ICD-10-CM | POA: Diagnosis not present

## 2015-11-25 DIAGNOSIS — S93402S Sprain of unspecified ligament of left ankle, sequela: Secondary | ICD-10-CM | POA: Diagnosis not present

## 2015-11-25 DIAGNOSIS — E119 Type 2 diabetes mellitus without complications: Secondary | ICD-10-CM | POA: Diagnosis not present

## 2015-11-25 DIAGNOSIS — I10 Essential (primary) hypertension: Secondary | ICD-10-CM | POA: Diagnosis not present

## 2015-11-25 DIAGNOSIS — M25562 Pain in left knee: Secondary | ICD-10-CM | POA: Diagnosis not present

## 2015-11-25 DIAGNOSIS — M1990 Unspecified osteoarthritis, unspecified site: Secondary | ICD-10-CM | POA: Diagnosis not present

## 2015-11-25 DIAGNOSIS — M6281 Muscle weakness (generalized): Secondary | ICD-10-CM | POA: Diagnosis not present

## 2015-11-26 DIAGNOSIS — S93402S Sprain of unspecified ligament of left ankle, sequela: Secondary | ICD-10-CM | POA: Diagnosis not present

## 2015-11-26 DIAGNOSIS — M6281 Muscle weakness (generalized): Secondary | ICD-10-CM | POA: Diagnosis not present

## 2015-11-26 DIAGNOSIS — M25562 Pain in left knee: Secondary | ICD-10-CM | POA: Diagnosis not present

## 2015-11-27 DIAGNOSIS — R2689 Other abnormalities of gait and mobility: Secondary | ICD-10-CM | POA: Diagnosis not present

## 2015-11-27 DIAGNOSIS — R531 Weakness: Secondary | ICD-10-CM | POA: Diagnosis not present

## 2015-11-27 DIAGNOSIS — S93402S Sprain of unspecified ligament of left ankle, sequela: Secondary | ICD-10-CM | POA: Diagnosis not present

## 2015-11-27 DIAGNOSIS — F329 Major depressive disorder, single episode, unspecified: Secondary | ICD-10-CM | POA: Diagnosis not present

## 2015-11-27 DIAGNOSIS — I10 Essential (primary) hypertension: Secondary | ICD-10-CM | POA: Diagnosis not present

## 2015-11-27 DIAGNOSIS — E1140 Type 2 diabetes mellitus with diabetic neuropathy, unspecified: Secondary | ICD-10-CM | POA: Diagnosis not present

## 2015-11-27 DIAGNOSIS — M6281 Muscle weakness (generalized): Secondary | ICD-10-CM | POA: Diagnosis not present

## 2015-11-27 DIAGNOSIS — F0391 Unspecified dementia with behavioral disturbance: Secondary | ICD-10-CM | POA: Diagnosis not present

## 2015-11-27 DIAGNOSIS — M25562 Pain in left knee: Secondary | ICD-10-CM | POA: Diagnosis not present

## 2015-11-27 DIAGNOSIS — S93409A Sprain of unspecified ligament of unspecified ankle, initial encounter: Secondary | ICD-10-CM | POA: Diagnosis not present

## 2015-11-30 ENCOUNTER — Encounter: Payer: Self-pay | Admitting: Neurology

## 2015-11-30 ENCOUNTER — Ambulatory Visit (INDEPENDENT_AMBULATORY_CARE_PROVIDER_SITE_OTHER): Payer: Medicare Other | Admitting: Neurology

## 2015-11-30 VITALS — BP 120/68 | HR 68 | Resp 20 | Ht 69.0 in | Wt 276.0 lb

## 2015-11-30 DIAGNOSIS — M25562 Pain in left knee: Secondary | ICD-10-CM | POA: Diagnosis not present

## 2015-11-30 DIAGNOSIS — F4312 Post-traumatic stress disorder, chronic: Secondary | ICD-10-CM | POA: Insufficient documentation

## 2015-11-30 DIAGNOSIS — G894 Chronic pain syndrome: Secondary | ICD-10-CM | POA: Diagnosis not present

## 2015-11-30 DIAGNOSIS — R351 Nocturia: Secondary | ICD-10-CM | POA: Diagnosis not present

## 2015-11-30 DIAGNOSIS — G4733 Obstructive sleep apnea (adult) (pediatric): Secondary | ICD-10-CM

## 2015-11-30 DIAGNOSIS — F4310 Post-traumatic stress disorder, unspecified: Secondary | ICD-10-CM | POA: Insufficient documentation

## 2015-11-30 DIAGNOSIS — Z9989 Dependence on other enabling machines and devices: Principal | ICD-10-CM

## 2015-11-30 DIAGNOSIS — M6281 Muscle weakness (generalized): Secondary | ICD-10-CM | POA: Diagnosis not present

## 2015-11-30 DIAGNOSIS — S93402S Sprain of unspecified ligament of left ankle, sequela: Secondary | ICD-10-CM | POA: Diagnosis not present

## 2015-11-30 MED ORDER — QUETIAPINE FUMARATE 25 MG PO TABS: 25.0000 mg | ORAL_TABLET | Freq: Every day | ORAL | Status: DC

## 2015-11-30 NOTE — Patient Instructions (Addendum)
Sleep Studies A sleep study (polysomnogram) is a series of tests done while you are sleeping. It can show how well you sleep. This can help your health care provider diagnose a sleep disorder and show how severe your sleep disorder is. A sleep study may lead to treatment that will help you sleep better and prevent other medical problems caused by poor sleep. If you have a sleep disorder, you may also be at risk for:   Sleep-related accidents.  High blood pressure.  Heart disease.  Stroke.  Other medical conditions. Sleep disorders are common. Your health care provider may suspect a sleep disorder if you:  Have loud snoring most nights.  Have brief periods when you stop breathing at night.  Feel sleepy on most days.  Fall asleep suddenly during the day.  Have trouble falling asleep or staying asleep.  Feel like you need to move your legs when trying to fall asleep.  Have dreams that seem very real shortly after falling asleep.  Feel like you cannot move when you first wake up. WHICH TESTS WILL I NEED TO HAVE?  Most sleep studies last all night and include these tests:  Recordings of your brain activity.  Recordings of your eye movements.  Recording of your heart rate and rhythm.  Blood pressure readings.  Readings of the amount of oxygen in your blood.  Measurements of your chest and belly movement as you breathe during sleep. If you have signs of the sleep disorder called sleep apnea during your test, you may get a mask to wear for the second half of the night.   The mask provides continuous positive airway pressure (CPAP). This may improve sleep apnea significantly.  You will then have all tests done again with the mask in place to see if your measurements and recordings change. HOW ARE SLEEP STUDIES DONE? Most sleep studies are done over one full night of sleep.   You will arrive at the study center in the evening and can go home in the morning.  Bring your  pajamas and toothbrush.  Do not have caffeine on the day of your sleep study.  Your health care provider will let you know if you need to stop taking any of your regular medicines before the test. To do the tests included in a polysomnogram, you will have:  Round, sticky patches with sensors attached to recording wires (electrodes) placed on your scalp, face, chest, and limbs.  Wires from all the electrodes and sensors run from your bed to a computer. The wires can be taken off and put back on if you need to get out of bed to go to the bathroom.  A sensor placed over your nose to measure airflow.  A finger clip put on one finger to measure your blood oxygen level.  A belt around your belly and a belt around your chest to measure breathing movements. WHERE ARE SLEEP STUDIES DONE?  Sleep studies are done at sleep centers. A sleep center may be inside a hospital, office, or clinic.  The room where you have the study may look like a hospital room or a hotel room. The health care providers doing the study may come in and out of the room during the study. Most of the time, they will be in another room monitoring your test.  HOW IS INFORMATION FROM SLEEP STUDIES HELPFUL? A polysomnogram can be used along with your medical history and a physical exam to diagnose conditions, such as:  Sleep   apnea.  Restless legs syndrome.  Sleep-related seizure disorders.  Sleep-related movement disorders. A medical doctor who specializes in sleep will evaluate your sleep study. The specialist will share the results with your primary health care provider. Treatments based on your sleep study may include:  Improving your sleep habits (sleep hygiene).  Wearing a CPAP mask.  Wearing an oral device at night to improve breathing and reduce snoring.  Taking medicine for:  Restless legs syndrome.  Sleep-related seizure disorder.  Sleep-related movement disorder.   This information is not intended to  replace advice given to you by your health care provider. Make sure you discuss any questions you have with your health care provider.   Document Released: 02/19/2003 Document Revised: 09/05/2014 Document Reviewed: 10/21/2013 Elsevier Interactive Patient Education 2016 [employer] Quetiapine tablets What is this medicine? QUETIAPINE (kwe TYE a peen) is an antipsychotic. It is used to treat schizophrenia and bipolar disorder, also known as manic-depression. This medicine may be used for other purposes; ask your health care provider or pharmacist if you have questions. What should I tell my health care provider before I take this medicine? They need to know if you have any of these conditions: -brain tumor or head injury -breast cancer -cataracts -diabetes -difficulty swallowing -heart disease -kidney disease -liver disease -low blood counts, like low white cell, platelet, or red cell counts -low blood pressure or dizziness when standing up -Parkinson's disease -previous heart attack -seizures -suicidal thoughts, plans, or attempt by you or a family member -thyroid disease -an unusual or allergic reaction to quetiapine, other medicines, foods, dyes, or preservatives -pregnant or trying to get pregnant -breast-feeding How should I use this medicine? Take this medicine by mouth. Swallow it with a drink of water. Follow the directions on the prescription label. If it upsets your stomach you can take it with food. Take your medicine at regular intervals. Do not take it more often than directed. Do not stop taking except on the advice of your doctor or health care professional. A special MedGuide will be given to you by the pharmacist with each prescription and refill. Be sure to read this information carefully each time. Talk to your pediatrician regarding the use of this medicine in children. While this drug may be prescribed for children as young as 10 years for selected conditions,  precautions do apply. Patients over age 9 years may have a stronger reaction to this medicine and need smaller doses. Overdosage: If you think you have taken too much of this medicine contact a poison control center or emergency room at once. NOTE: This medicine is only for you. Do not share this medicine with others. What if I miss a dose? If you miss a dose, take it as soon as you can. If it is almost time for your next dose, take only that dose. Do not take double or extra doses. What may interact with this medicine? Do not take this medicine with any of the following medications: -certain medicines for fungal infections like fluconazole, itraconazole, ketoconazole, posaconazole, voriconazole -cisapride -dofetilide -dronedarone -droperidol -grepafloxacin -halofantrine -phenothiazines like chlorpromazine, mesoridazine, thioridazine -pimozide -sparfloxacin -ziprasidone This medicine may also interact with the following medications: -alcohol -antiviral medicines for HIV or AIDS -certain medicines for blood pressure -certain medicines for diabetes -certain medicines for seizures like carbamazepine, phenobarbital, phenytoin -cimetidine -erythromycin -other medicines that prolong the QT interval (cause an abnormal heart rhythm) -rifampin -steroid medicines like prednisone or cortisone This list may not describe all possible interactions. Give  your health care provider a list of all the medicines, herbs, non-prescription drugs, or dietary supplements you use. Also tell them if you smoke, drink alcohol, or use illegal drugs. Some items may interact with your medicine. What should I watch for while using this medicine? Visit your doctor or health care professional for regular checks on your progress. It may be several weeks before you see the full effects of this medicine. Your health care provider may suggest that you have your eyes examined prior to starting this medicine, and every 6  months thereafter. If you have been taking this medicine regularly for some time, do not suddenly stop taking it. You must gradually reduce the dose or your symptoms may get worse. Ask your doctor or health care professional for advice. Patients and their families should watch out for worsening depression or thoughts of suicide. Also watch out for sudden or severe changes in feelings such as feeling anxious, agitated, panicky, irritable, hostile, aggressive, impulsive, severely restless, overly excited and hyperactive, or not being able to sleep. If this happens, especially at the beginning of antidepressant treatment or after a change in dose, call your health care professional. Dennis Bast may get dizzy or drowsy. Do not drive, use machinery, or do anything that needs mental alertness until you know how this medicine affects you. Do not stand or sit up quickly, especially if you are an older patient. This reduces the risk of dizzy or fainting spells. Alcohol can increase dizziness and drowsiness. Avoid alcoholic drinks. Do not treat yourself for colds, diarrhea or allergies. Ask your doctor or health care professional for advice, some ingredients may increase possible side effects. This medicine can reduce the response of your body to heat or cold. Dress warm in cold weather and stay hydrated in hot weather. If possible, avoid extreme temperatures like saunas, hot tubs, very hot or cold showers, or activities that can cause dehydration such as vigorous exercise. What side effects may I notice from receiving this medicine? Side effects that you should report to your doctor or health care professional as soon as possible: -allergic reactions like skin rash, itching or hives, swelling of the face, lips, or tongue -difficulty swallowing -fast or irregular heartbeat -fever or chills, sore throat -fever with rash, swollen lymph nodes, or swelling of the face -increased hunger or thirst -increased  urination -problems with balance, talking, walking -seizures -stiff muscles -suicidal thoughts or other mood changes -uncontrollable head, mouth, neck, arm, or leg movements -unusually weak or tired Side effects that usually do not require medical attention (report to your doctor or health care professional if they continue or are bothersome): -change in sex drive or performance -constipation -drowsy or dizzy -dry mouth -stomach upset -weight gain This list may not describe all possible side effects. Call your doctor for medical advice about side effects. You may report side effects to FDA at 1-800-FDA-1088. Where should I keep my medicine? Keep out of the reach of children. Store at room temperature between 15 and 30 degrees C (59 and 86 degrees F). Throw away any unused medicine after the expiration date. NOTE: This sheet is a summary. It may not cover all possible information. If you have questions about this medicine, talk to your doctor, pharmacist, or health care provider.   Posttraumatic Stress Disorder Posttraumatic stress disorder (PTSD) is a mental disorder. It occurs after a traumatic event in your life. The traumatic events that cause PTSD are outside the range of normal human experience. Examples of these  events include war, automobile accidents, natural disasters, rape, domestic violence, and violent crimes. Most people who experience these types of events are able to heal on their own. Those who do not heal develop PTSD. PTSD can happen to anyone at any age. However, people with a history of childhood abuse are at increased risk for developing PTSD.  SYMPTOMS  The traumatic event that causes PTSD must be a threat to life, cause serious injury, or involve sexual violence. The traumatic event is usually experienced directly by the person who develops PTSD. Sometimes PTSD occurs in people who witness traumas that occur to others or who hear about a trauma that occurs to a close  family member or friend. The following behaviors are characteristic of people with PTSD:  People with PTSD re-experience the traumatic event in one or more of the following ways (intrusion symptoms):  Recurrent, unwanted distressing memories while awake.  Recurrent distressing dreams.  Sensations similar to those felt when the event originally occurred (flashbacks).   Intense or prolonged emotional distress, triggered by reminders of the trauma. This may include fear, horror, intense sadness, or anger.  Marked physical reactions, triggered by reminders of the trauma. This may include racing heart, shortness of breath, sweating, and shaking.  People with PTSD avoid thoughts, conversations, people, or activities that remind them of the traumatic event (avoidance symptoms).  People with PTSD have negative changes in their thinking and mood after the traumatic event. These changes include:  Inability to remember one or more significant aspects of the traumatic event (memory gaps).  Exaggerated negative perceptions about themselves or others, such as believing that they are bad people or that no one can be trusted.  Unrealistic assignment of blame to themselves or others for the traumatic event.  Persistent negative emotional state, such as fear, horror, anger, sadness, guilt, or shame.  Markedly decreased interest or participation in significant activities.  A loss of connection with other people.  Inability to experience positive emotions, such as happiness or love.  People with PTSD are more sensitive to their environment and react more easily than others (hyperarousal-overreactivity symptoms). These symptoms include:  Irritability, with angry outbursts toward other people or objects. The outbursts are easily triggered and may be verbal or physical.  Careless or self-destructive behavior. This may include reckless driving or drug use.  A feeling of being on edge, with increased  alertness (hypervigilance).  Exaggerated reactions to stimuli, such as being easily startled.   Difficulty concentrating.  Difficulty sleeping. PTSD symptoms may start soon after a frightening event or months or years later. They last at least 1 month or longer and can affect one or more areas of functioning, such as social or occupational functioning.  DIAGNOSIS  PTSD is diagnosed through an assessment by a mental health professional. Dennis Bast will be asked questions about the traumatic events in your life. You will also be asked about how these events have changed your thoughts, mood, behavior, and ability to function on a daily basis. You may be asked about your use of alcohol or drugs, which can make PTSD symptoms worse. TREATMENT  Unlike many mental disorders, which require lifelong management, PTSD is a curable condition. The goal of PTSD treatment is to neutralize the negative effects of the traumatic event on daily functioning, not erase the memory of the event. The following treatments may be prescribed to reach this goal:  Medicines. Certain medicines can reduce some PTSD symptoms. Intrusion symptoms and hyperarousal-overactivity symptoms respond best to medicines.  Counseling (talk therapy). Talk therapy with a mental health professional who is experienced in treating PTSD can help. Talk therapy can provide education, emotional support, and coping skills. Certain types of talk therapy that specifically target the traumatic events are the most effective treatment for PTSD:  Prolonged exposure therapy, which involves remembering and processing the traumatic event with a therapist in a safe environment until it no longer creates a negative emotional response.  Eye movement desensitization and reprocessing therapy, which involves the use of repetitive physical stimulation of the senses that alternates between the right and left sides of the body. It is believed that this therapy facilitates  communication between the two sides of the brain. This communication helps the mind to integrate the fragmented memories of the traumatic event into a whole story that makes sense and no longer creates a negative emotional response. Most people with PTSD benefit from a combination of these treatments.    This information is not intended to replace advice given to you by your health care provider. Make sure you discuss any questions you have with your health care provider.   Document Released: 05/10/2001 Document Revised: 09/05/2014 Document Reviewed: 11/01/2012 Elsevier Interactive Patient Education 2016 [employer].   Please remember to try to maintain good sleep hygiene, which means: Keep a regular sleep and wake schedule, try not to exercise or have a meal within 2 hours of your bedtime, try to keep your bedroom conducive for sleep, that is, cool and dark, without light distractors such as an illuminated alarm clock, and refrain from watching TV right before sleep or in the middle of the night and do not keep the TV or radio on during the night. Also, try not to use or play on electronic devices at bedtime, such as your cell phone, tablet PC or laptop. If you like to read at bedtime on an electronic device, try to dim the background light as much as possible. Do not eat in the middle of the night.   We will request a sleep study.    We will look for leg twitching and snoring or sleep apnea.   For chronic insomnia, you are best followed by a psychiatrist and/or sleep psychologist.   We will call you with the sleep study results and make a follow up appointment if needed.
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
## 2015-12-01 DIAGNOSIS — M25562 Pain in left knee: Secondary | ICD-10-CM | POA: Diagnosis not present

## 2015-12-01 DIAGNOSIS — M6281 Muscle weakness (generalized): Secondary | ICD-10-CM | POA: Diagnosis not present

## 2015-12-01 DIAGNOSIS — R52 Pain, unspecified: Secondary | ICD-10-CM | POA: Diagnosis not present

## 2015-12-01 DIAGNOSIS — S93402S Sprain of unspecified ligament of left ankle, sequela: Secondary | ICD-10-CM | POA: Diagnosis not present

## 2015-12-01 DIAGNOSIS — M1990 Unspecified osteoarthritis, unspecified site: Secondary | ICD-10-CM | POA: Diagnosis not present

## 2015-12-01 DIAGNOSIS — E119 Type 2 diabetes mellitus without complications: Secondary | ICD-10-CM | POA: Diagnosis not present

## 2015-12-01 DIAGNOSIS — I10 Essential (primary) hypertension: Secondary | ICD-10-CM | POA: Diagnosis not present

## 2015-12-02 ENCOUNTER — Encounter: Payer: Self-pay | Admitting: *Deleted

## 2015-12-02 ENCOUNTER — Telehealth: Payer: Self-pay | Admitting: Neurology

## 2015-12-02 DIAGNOSIS — M25562 Pain in left knee: Secondary | ICD-10-CM | POA: Diagnosis not present

## 2015-12-02 DIAGNOSIS — S93402S Sprain of unspecified ligament of left ankle, sequela: Secondary | ICD-10-CM | POA: Diagnosis not present

## 2015-12-02 DIAGNOSIS — M6281 Muscle weakness (generalized): Secondary | ICD-10-CM | POA: Diagnosis not present

## 2015-12-02 NOTE — Telephone Encounter (Signed)
This patient has orders for NPSG and a Split.  Which one do I need to process?

## 2015-12-02 NOTE — Telephone Encounter (Signed)
Alert on, I reviewed my notes. I really want a parasomnia montage and split-night study in 1. I will be happy with just a few extra EEGs in addition to the usual standard EEG but would like the tech to make sure that we have good video and audio.

## 2015-12-03 DIAGNOSIS — M25562 Pain in left knee: Secondary | ICD-10-CM | POA: Diagnosis not present

## 2015-12-03 DIAGNOSIS — M6281 Muscle weakness (generalized): Secondary | ICD-10-CM | POA: Diagnosis not present

## 2015-12-03 DIAGNOSIS — S93402S Sprain of unspecified ligament of left ankle, sequela: Secondary | ICD-10-CM | POA: Diagnosis not present

## 2015-12-04 DIAGNOSIS — M79672 Pain in left foot: Secondary | ICD-10-CM | POA: Diagnosis not present

## 2015-12-04 DIAGNOSIS — B351 Tinea unguium: Secondary | ICD-10-CM | POA: Diagnosis not present

## 2015-12-04 DIAGNOSIS — E119 Type 2 diabetes mellitus without complications: Secondary | ICD-10-CM | POA: Diagnosis not present

## 2015-12-04 DIAGNOSIS — S93402S Sprain of unspecified ligament of left ankle, sequela: Secondary | ICD-10-CM | POA: Diagnosis not present

## 2015-12-04 DIAGNOSIS — M6281 Muscle weakness (generalized): Secondary | ICD-10-CM | POA: Diagnosis not present

## 2015-12-04 DIAGNOSIS — M25562 Pain in left knee: Secondary | ICD-10-CM | POA: Diagnosis not present

## 2015-12-04 DIAGNOSIS — M79671 Pain in right foot: Secondary | ICD-10-CM | POA: Diagnosis not present

## 2015-12-15 DIAGNOSIS — E1140 Type 2 diabetes mellitus with diabetic neuropathy, unspecified: Secondary | ICD-10-CM | POA: Diagnosis not present

## 2015-12-15 DIAGNOSIS — E1142 Type 2 diabetes mellitus with diabetic polyneuropathy: Secondary | ICD-10-CM | POA: Diagnosis not present

## 2015-12-15 DIAGNOSIS — E7800 Pure hypercholesterolemia, unspecified: Secondary | ICD-10-CM | POA: Diagnosis not present

## 2015-12-15 DIAGNOSIS — E538 Deficiency of other specified B group vitamins: Secondary | ICD-10-CM | POA: Diagnosis not present

## 2015-12-15 DIAGNOSIS — K219 Gastro-esophageal reflux disease without esophagitis: Secondary | ICD-10-CM | POA: Diagnosis not present

## 2015-12-15 DIAGNOSIS — I10 Essential (primary) hypertension: Secondary | ICD-10-CM | POA: Diagnosis not present

## 2015-12-15 DIAGNOSIS — E559 Vitamin D deficiency, unspecified: Secondary | ICD-10-CM | POA: Diagnosis not present

## 2015-12-15 DIAGNOSIS — R2689 Other abnormalities of gait and mobility: Secondary | ICD-10-CM | POA: Diagnosis not present

## 2015-12-15 DIAGNOSIS — E1149 Type 2 diabetes mellitus with other diabetic neurological complication: Secondary | ICD-10-CM | POA: Diagnosis not present

## 2015-12-15 LAB — LIPID PANEL
Cholesterol: 172 mg/dL (ref 0–200)
HDL: 45 mg/dL (ref 35–70)
LDL Cholesterol: 111 mg/dL
LDl/HDL Ratio: 3.8
Triglycerides: 129 mg/dL (ref 40–160)

## 2015-12-15 LAB — HEMOGLOBIN A1C: HEMOGLOBIN A1C: 7.7

## 2015-12-21 LAB — CBC AND DIFFERENTIAL
HEMATOCRIT: 42 % (ref 41–53)
HEMOGLOBIN: 13.6 g/dL (ref 13.5–17.5)
Neutrophils Absolute: 7 /uL
PLATELETS: 274 10*3/uL (ref 150–399)
WBC: 11 10*3/mL

## 2015-12-23 ENCOUNTER — Ambulatory Visit (INDEPENDENT_AMBULATORY_CARE_PROVIDER_SITE_OTHER): Payer: Medicare Other | Admitting: Neurology

## 2015-12-23 DIAGNOSIS — R351 Nocturia: Secondary | ICD-10-CM

## 2015-12-23 DIAGNOSIS — G4733 Obstructive sleep apnea (adult) (pediatric): Secondary | ICD-10-CM | POA: Diagnosis not present

## 2015-12-23 DIAGNOSIS — F4312 Post-traumatic stress disorder, chronic: Secondary | ICD-10-CM

## 2015-12-23 DIAGNOSIS — G894 Chronic pain syndrome: Secondary | ICD-10-CM

## 2015-12-23 DIAGNOSIS — Z9989 Dependence on other enabling machines and devices: Principal | ICD-10-CM

## 2015-12-29 ENCOUNTER — Telehealth: Payer: Self-pay

## 2015-12-29 DIAGNOSIS — G4733 Obstructive sleep apnea (adult) (pediatric): Secondary | ICD-10-CM

## 2015-12-29 NOTE — Telephone Encounter (Signed)
Spoke to pt and advised him of his sleep study results. I advised him that his study reveals osa and treatment is advised. PAP therapy is indicated and Dr. Brett Fairy recommends undergoing a cpap titration study. Pt is agreeable to the cpap titration. I advised him that our sleep lab will give him a call to set it up. Pt verbalized understanding. I advised pt to avoid caffeine containing beverages and chocolate. Pt verbalized understanding.

## 2016-01-12 ENCOUNTER — Telehealth: Payer: Self-pay | Admitting: Neurology

## 2016-01-12 ENCOUNTER — Other Ambulatory Visit: Payer: Self-pay

## 2016-01-12 MED ORDER — QUETIAPINE FUMARATE 25 MG PO TABS: ORAL_TABLET | ORAL | Status: DC

## 2016-01-12 NOTE — Telephone Encounter (Signed)
Patient said he needed a refill on Quetiapine 25 MG. He also said he wanted it sent to [facility]. He gets a discount with them. The best number to contact the patient is [phone]

## 2016-01-12 NOTE — Telephone Encounter (Signed)
I will allow him 50 mg of Seroquel at night. I do not have influence on [facility]'s decision-making process. We saw the patient on 12-29-15 in the order for a sleep study was placed, we are still waiting on insurance authorization.CD

## 2016-01-12 NOTE — Telephone Encounter (Signed)
See other telephone note from 01/12/16.

## 2016-01-12 NOTE — Telephone Encounter (Signed)
I spoke to pt and advised him that Dr. Brett Fairy increased his seroquel to 50 mg at night (take 2 tablets of the 25mg  at bedtime). Pt verbalized understanding. Pt was advised that the RX for seroquel was sent to [facility]. Pt verbalized understanding.

## 2016-01-12 NOTE — Telephone Encounter (Signed)
Pt came to clinic this morning requesting a refill on his seroquel be sent to [facility]. He cannot pick up another refill as prescribed at [facility] because his insurance will not cover it. His insurance is [employer]. I advised pt that I would send the seroquel refill request to Dr. Brett Fairy and if she approves it, it will be sent to [facility]. Pt verbalized understanding. Pt is also asking for an increase in dosage in the seroquel. He states that it still takes him 2 hours to go to sleep. He is currently taking seroquel 25 mg at bedtime.  Pt is also wanting to schedule his cpap titration. Pt claimed it has been over a month since it was ordered and no one has called him. I advised him that I spoke to him on 12/29/2015 and advised him that the order was placed for the sleep study on 12/29/2015 so it has only been 2 weeks and our sleep lab is most likely waiting on insurance authorization. I personally escorted pt to the sleep lab and the sleep lab schedulers are working on getting him scheduled.

## 2016-01-12 NOTE — Telephone Encounter (Signed)
Patient called back and was given message about Rx seroquel being sent to [facility].

## 2016-01-12 NOTE — Telephone Encounter (Signed)
I called pt to advise him that Dr. Brett Fairy increased his seroquel and that the RX was sent to [facility]. No answer, no VM set up. Will try again later.

## 2016-01-14 ENCOUNTER — Ambulatory Visit (INDEPENDENT_AMBULATORY_CARE_PROVIDER_SITE_OTHER): Payer: Medicare Other | Admitting: Neurology

## 2016-01-14 DIAGNOSIS — G4733 Obstructive sleep apnea (adult) (pediatric): Secondary | ICD-10-CM | POA: Diagnosis not present

## 2016-01-21 ENCOUNTER — Telehealth: Payer: Self-pay

## 2016-01-21 DIAGNOSIS — G4730 Sleep apnea, unspecified: Secondary | ICD-10-CM

## 2016-01-21 NOTE — Telephone Encounter (Signed)
I spoke to patient and he is aware of sleep study results and recommendations. He is willing to proceed with CPAP treatment. I have sent copy of report to PCP. Made f/u appt for 8/1.

## 2016-02-04 ENCOUNTER — Telehealth: Payer: Self-pay | Admitting: Neurology

## 2016-02-04 NOTE — Telephone Encounter (Signed)
Order printed and faxed to [facility]. Received a receipt of confirmation.

## 2016-02-04 NOTE — Telephone Encounter (Signed)
Heather with [facility] is calling to get an order for a CPAP for the patient. She needs to have the correct pressure. Please fax to [phone].

## 2016-02-11 ENCOUNTER — Inpatient Hospital Stay (HOSPITAL_COMMUNITY)
Admission: EM | Admit: 2016-02-11 | Discharge: 2016-02-15 | DRG: 690 | Disposition: A | Discharge status: Home / Self Care | Payer: Medicare Other | Attending: Internal Medicine | Admitting: Internal Medicine

## 2016-02-11 ENCOUNTER — Emergency Department (HOSPITAL_COMMUNITY): Payer: Medicare Other

## 2016-02-11 DIAGNOSIS — N183 Chronic kidney disease, stage 3 unspecified: Secondary | ICD-10-CM | POA: Diagnosis present

## 2016-02-11 DIAGNOSIS — R0902 Hypoxemia: Secondary | ICD-10-CM | POA: Diagnosis present

## 2016-02-11 DIAGNOSIS — K838 Other specified diseases of biliary tract: Secondary | ICD-10-CM | POA: Diagnosis not present

## 2016-02-11 DIAGNOSIS — G4733 Obstructive sleep apnea (adult) (pediatric): Secondary | ICD-10-CM | POA: Diagnosis present

## 2016-02-11 DIAGNOSIS — E875 Hyperkalemia: Secondary | ICD-10-CM | POA: Diagnosis present

## 2016-02-11 DIAGNOSIS — E7800 Pure hypercholesterolemia, unspecified: Secondary | ICD-10-CM | POA: Diagnosis present

## 2016-02-11 DIAGNOSIS — W19XXXA Unspecified fall, initial encounter: Secondary | ICD-10-CM | POA: Diagnosis present

## 2016-02-11 DIAGNOSIS — Z794 Long term (current) use of insulin: Secondary | ICD-10-CM

## 2016-02-11 DIAGNOSIS — F4310 Post-traumatic stress disorder, unspecified: Secondary | ICD-10-CM | POA: Diagnosis present

## 2016-02-11 DIAGNOSIS — E860 Dehydration: Secondary | ICD-10-CM | POA: Diagnosis present

## 2016-02-11 DIAGNOSIS — T402X5A Adverse effect of other opioids, initial encounter: Secondary | ICD-10-CM | POA: Diagnosis present

## 2016-02-11 DIAGNOSIS — E669 Obesity, unspecified: Secondary | ICD-10-CM | POA: Diagnosis present

## 2016-02-11 DIAGNOSIS — I10 Essential (primary) hypertension: Secondary | ICD-10-CM | POA: Diagnosis present

## 2016-02-11 DIAGNOSIS — K8020 Calculus of gallbladder without cholecystitis without obstruction: Secondary | ICD-10-CM | POA: Diagnosis not present

## 2016-02-11 DIAGNOSIS — I129 Hypertensive chronic kidney disease with stage 1 through stage 4 chronic kidney disease, or unspecified chronic kidney disease: Secondary | ICD-10-CM | POA: Diagnosis present

## 2016-02-11 DIAGNOSIS — R296 Repeated falls: Secondary | ICD-10-CM | POA: Diagnosis present

## 2016-02-11 DIAGNOSIS — E1121 Type 2 diabetes mellitus with diabetic nephropathy: Secondary | ICD-10-CM | POA: Diagnosis present

## 2016-02-11 DIAGNOSIS — N281 Cyst of kidney, acquired: Secondary | ICD-10-CM | POA: Diagnosis not present

## 2016-02-11 DIAGNOSIS — R339 Retention of urine, unspecified: Secondary | ICD-10-CM | POA: Diagnosis not present

## 2016-02-11 DIAGNOSIS — E11649 Type 2 diabetes mellitus with hypoglycemia without coma: Secondary | ICD-10-CM | POA: Diagnosis present

## 2016-02-11 DIAGNOSIS — E1122 Type 2 diabetes mellitus with diabetic chronic kidney disease: Secondary | ICD-10-CM

## 2016-02-11 DIAGNOSIS — Z9989 Dependence on other enabling machines and devices: Secondary | ICD-10-CM

## 2016-02-11 DIAGNOSIS — R0602 Shortness of breath: Secondary | ICD-10-CM | POA: Diagnosis not present

## 2016-02-11 DIAGNOSIS — R600 Localized edema: Secondary | ICD-10-CM | POA: Diagnosis present

## 2016-02-11 DIAGNOSIS — K219 Gastro-esophageal reflux disease without esophagitis: Secondary | ICD-10-CM | POA: Diagnosis present

## 2016-02-11 DIAGNOSIS — Z6841 Body Mass Index (BMI) 40.0 and over, adult: Secondary | ICD-10-CM | POA: Diagnosis not present

## 2016-02-11 DIAGNOSIS — R6510 Systemic inflammatory response syndrome (SIRS) of non-infectious origin without acute organ dysfunction: Secondary | ICD-10-CM | POA: Diagnosis present

## 2016-02-11 DIAGNOSIS — T1490 Injury, unspecified: Secondary | ICD-10-CM | POA: Diagnosis not present

## 2016-02-11 DIAGNOSIS — K5903 Drug induced constipation: Secondary | ICD-10-CM | POA: Diagnosis present

## 2016-02-11 DIAGNOSIS — N390 Urinary tract infection, site not specified: Secondary | ICD-10-CM | POA: Diagnosis not present

## 2016-02-11 DIAGNOSIS — E119 Type 2 diabetes mellitus without complications: Secondary | ICD-10-CM | POA: Insufficient documentation

## 2016-02-11 DIAGNOSIS — N10 Acute tubulo-interstitial nephritis: Principal | ICD-10-CM | POA: Diagnosis present

## 2016-02-11 LAB — CBC
HEMATOCRIT: 38.9 % — AB (ref 39.0–52.0)
HEMOGLOBIN: 12.5 g/dL — AB (ref 13.0–17.0)
MCH: 28.9 pg (ref 26.0–34.0)
MCHC: 32.1 g/dL (ref 30.0–36.0)
MCV: 89.8 fL (ref 78.0–100.0)
Platelets: 229 10*3/uL (ref 150–400)
RBC: 4.33 MIL/uL (ref 4.22–5.81)
RDW: 13.3 % (ref 11.5–15.5)
WBC: 25.4 10*3/uL — AB (ref 4.0–10.5)

## 2016-02-11 LAB — URINE MICROSCOPIC-ADD ON

## 2016-02-11 LAB — COMPREHENSIVE METABOLIC PANEL
ALT: 18 U/L (ref 17–63)
AST: 27 U/L (ref 15–41)
Albumin: 3.1 g/dL — ABNORMAL LOW (ref 3.5–5.0)
Alkaline Phosphatase: 87 U/L (ref 38–126)
Anion gap: 10 (ref 5–15)
BUN: 31 mg/dL — AB (ref 6–20)
CHLORIDE: 102 mmol/L (ref 101–111)
CO2: 23 mmol/L (ref 22–32)
Calcium: 8.9 mg/dL (ref 8.9–10.3)
Creatinine, Ser: 1.63 mg/dL — ABNORMAL HIGH (ref 0.61–1.24)
GFR calc Af Amer: 44 mL/min — ABNORMAL LOW (ref >60)
GFR, EST NON AFRICAN AMERICAN: 38 mL/min — AB (ref >60)
Glucose, Bld: 214 mg/dL — ABNORMAL HIGH (ref 65–99)
POTASSIUM: 4.4 mmol/L (ref 3.5–5.1)
Sodium: 135 mmol/L (ref 135–145)
Total Bilirubin: 1.2 mg/dL (ref 0.3–1.2)
Total Protein: 6.4 g/dL — ABNORMAL LOW (ref 6.5–8.1)

## 2016-02-11 LAB — URINALYSIS, ROUTINE W REFLEX MICROSCOPIC
Bilirubin Urine: NEGATIVE
Glucose, UA: NEGATIVE mg/dL
Ketones, ur: 40 mg/dL — AB
NITRITE: POSITIVE — AB
Protein, ur: 100 mg/dL — AB
SPECIFIC GRAVITY, URINE: 1.024 (ref 1.005–1.030)
pH: 5.5 (ref 5.0–8.0)

## 2016-02-11 LAB — LACTIC ACID, PLASMA: LACTIC ACID, VENOUS: 1.6 mmol/L (ref 0.5–2.0)

## 2016-02-11 LAB — BRAIN NATRIURETIC PEPTIDE: B NATRIURETIC PEPTIDE 5: 131.7 pg/mL — AB (ref 0.0–100.0)

## 2016-02-11 LAB — I-STAT TROPONIN, ED: Troponin i, poc: 0 ng/mL (ref 0.00–0.08)

## 2016-02-11 MED ORDER — DEXTROSE 5 % IV SOLN
2.0000 g | Freq: Once | INTRAVENOUS | Status: DC
  Filled 2016-02-11: qty 2

## 2016-02-11 MED ORDER — SODIUM CHLORIDE 0.9 % IV BOLUS (SEPSIS)
1000.0000 mL | Freq: Once | INTRAVENOUS | Status: AC
  Administered 2016-02-11: 1000 mL via INTRAVENOUS

## 2016-02-11 MED ORDER — QUETIAPINE FUMARATE 25 MG PO TABS
50.0000 mg | ORAL_TABLET | Freq: Every day | ORAL | Status: DC
  Administered 2016-02-12 – 2016-02-14 (×4): 50 mg via ORAL
  Filled 2016-02-11 (×4): qty 2

## 2016-02-11 MED ORDER — DEXTROSE 5 % IV SOLN
1.0000 g | INTRAVENOUS | Status: DC
  Administered 2016-02-11: 1 g via INTRAVENOUS
  Filled 2016-02-11 (×2): qty 10

## 2016-02-11 MED ORDER — METHOCARBAMOL 500 MG PO TABS: 750.0000 mg | ORAL_TABLET | Freq: Four times a day (QID) | ORAL | Status: DC | PRN

## 2016-02-11 MED ORDER — INSULIN GLARGINE 100 UNIT/ML ~~LOC~~ SOLN
30.0000 [IU] | Freq: Every day | SUBCUTANEOUS | Status: DC
  Administered 2016-02-12: 30 [IU] via SUBCUTANEOUS
  Filled 2016-02-11 (×2): qty 0.3

## 2016-02-11 MED ORDER — LISINOPRIL 20 MG PO TABS
20.0000 mg | ORAL_TABLET | Freq: Every day | ORAL | Status: DC
  Administered 2016-02-12: 20 mg via ORAL
  Filled 2016-02-11: qty 1

## 2016-02-11 MED ORDER — DOCUSATE SODIUM 100 MG PO CAPS
100.0000 mg | ORAL_CAPSULE | Freq: Two times a day (BID) | ORAL | Status: DC
  Administered 2016-02-12 – 2016-02-15 (×8): 100 mg via ORAL
  Filled 2016-02-11 (×8): qty 1

## 2016-02-11 MED ORDER — ACETAMINOPHEN 500 MG PO TABS
500.0000 mg | ORAL_TABLET | Freq: Once | ORAL | Status: AC
  Administered 2016-02-11: 500 mg via ORAL
  Filled 2016-02-11: qty 1

## 2016-02-11 MED ORDER — POLYVINYL ALCOHOL 1.4 % OP SOLN
1.0000 [drp] | Freq: Two times a day (BID) | OPHTHALMIC | Status: DC
  Administered 2016-02-12 – 2016-02-15 (×7): 1 [drp] via OPHTHALMIC
  Filled 2016-02-11 (×2): qty 15

## 2016-02-11 MED ORDER — ISOSORBIDE MONONITRATE ER 30 MG PO TB24
30.0000 mg | ORAL_TABLET | Freq: Every day | ORAL | Status: DC
  Administered 2016-02-12: 30 mg via ORAL
  Filled 2016-02-11: qty 1

## 2016-02-11 MED ORDER — TOBRAMYCIN-DEXAMETHASONE 0.3-0.1 % OP SUSP
1.0000 [drp] | OPHTHALMIC | Status: DC
  Administered 2016-02-13 – 2016-02-15 (×10): 1 [drp] via OPHTHALMIC
  Filled 2016-02-11 (×2): qty 2.5

## 2016-02-11 MED ORDER — CARVEDILOL 3.125 MG PO TABS
3.1250 mg | ORAL_TABLET | Freq: Two times a day (BID) | ORAL | Status: DC
  Administered 2016-02-12: 3.125 mg via ORAL
  Filled 2016-02-11: qty 1

## 2016-02-11 MED ORDER — IOPAMIDOL (ISOVUE-370) INJECTION 76%
INTRAVENOUS | Status: AC
  Filled 2016-02-11: qty 100

## 2016-02-11 MED ORDER — SIMVASTATIN 40 MG PO TABS
40.0000 mg | ORAL_TABLET | Freq: Every evening | ORAL | Status: DC
  Administered 2016-02-12 – 2016-02-14 (×3): 40 mg via ORAL
  Filled 2016-02-11 (×3): qty 1

## 2016-02-11 MED ORDER — VITAMIN D 1000 UNITS PO TABS
2000.0000 [IU] | ORAL_TABLET | Freq: Every day | ORAL | Status: DC
  Administered 2016-02-12 – 2016-02-14 (×4): 2000 [IU] via ORAL
  Filled 2016-02-11 (×4): qty 2

## 2016-02-11 MED ORDER — INSULIN ASPART 100 UNIT/ML ~~LOC~~ SOLN
0.0000 [IU] | Freq: Three times a day (TID) | SUBCUTANEOUS | Status: DC
Start: 2016-02-12 — End: 2016-02-13
  Administered 2016-02-12: 5 [IU] via SUBCUTANEOUS
  Administered 2016-02-12: 2 [IU] via SUBCUTANEOUS
  Administered 2016-02-12: 3 [IU] via SUBCUTANEOUS
  Administered 2016-02-13: 2 [IU] via SUBCUTANEOUS

## 2016-02-11 MED ORDER — ENOXAPARIN SODIUM 40 MG/0.4ML ~~LOC~~ SOLN
40.0000 mg | SUBCUTANEOUS | Status: DC
  Administered 2016-02-12 – 2016-02-15 (×4): 40 mg via SUBCUTANEOUS
  Filled 2016-02-11 (×4): qty 0.4

## 2016-02-11 MED ORDER — MAGNESIUM OXIDE 400 (241.3 MG) MG PO TABS
400.0000 mg | ORAL_TABLET | Freq: Two times a day (BID) | ORAL | Status: DC
  Administered 2016-02-12 – 2016-02-15 (×8): 400 mg via ORAL
  Filled 2016-02-11 (×8): qty 1

## 2016-02-11 MED ORDER — PANTOPRAZOLE SODIUM 40 MG PO TBEC
40.0000 mg | DELAYED_RELEASE_TABLET | Freq: Two times a day (BID) | ORAL | Status: DC
  Administered 2016-02-12 – 2016-02-15 (×8): 40 mg via ORAL
  Filled 2016-02-11 (×8): qty 1

## 2016-02-11 MED ORDER — ACETAMINOPHEN 325 MG PO TABS: 650.0000 mg | ORAL_TABLET | Freq: Four times a day (QID) | ORAL | Status: DC | PRN

## 2016-02-11 MED ORDER — GALANTAMINE HYDROBROMIDE ER 8 MG PO CP24
16.0000 mg | ORAL_CAPSULE | Freq: Every day | ORAL | Status: DC
  Administered 2016-02-12 – 2016-02-15 (×4): 16 mg via ORAL
  Filled 2016-02-11 (×5): qty 2

## 2016-02-11 NOTE — ED Notes (Signed)
Pt in via [facility] per report pt reports urinary retention x 4 days, pt reports x 5 times falls over the last 2 days, without hitting head & -LOC, pt uses walker at home, pt reports increased SOB at rest worse with exertion, pt reports bil lower extremity edema & abd distention, pt reports 20 lb wt gain over the last 3 mths, A&O x4, pt takes Lasix & Carvedilol per EMS, denies CP
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
## 2016-02-11 NOTE — ED Notes (Signed)
Patient transported to CT 
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
## 2016-02-12 ENCOUNTER — Encounter (HOSPITAL_COMMUNITY): Payer: Self-pay

## 2016-02-12 ENCOUNTER — Inpatient Hospital Stay (HOSPITAL_COMMUNITY): Payer: Medicare Other

## 2016-02-12 LAB — BASIC METABOLIC PANEL
Anion gap: 7 (ref 5–15)
BUN: 30 mg/dL — AB (ref 6–20)
CALCIUM: 8.3 mg/dL — AB (ref 8.9–10.3)
CO2: 24 mmol/L (ref 22–32)
CREATININE: 1.54 mg/dL — AB (ref 0.61–1.24)
Chloride: 106 mmol/L (ref 101–111)
GFR, EST AFRICAN AMERICAN: 47 mL/min — AB (ref >60)
GFR, EST NON AFRICAN AMERICAN: 41 mL/min — AB (ref >60)
Glucose, Bld: 228 mg/dL — ABNORMAL HIGH (ref 65–99)
Potassium: 4 mmol/L (ref 3.5–5.1)
SODIUM: 137 mmol/L (ref 135–145)

## 2016-02-12 LAB — GLUCOSE, CAPILLARY
GLUCOSE-CAPILLARY: 175 mg/dL — AB (ref 65–99)
Glucose-Capillary: 227 mg/dL — ABNORMAL HIGH (ref 65–99)
Glucose-Capillary: 208 mg/dL — ABNORMAL HIGH (ref 65–99)
Glucose-Capillary: 139 mg/dL — ABNORMAL HIGH (ref 65–99)
Glucose-Capillary: 147 mg/dL — ABNORMAL HIGH (ref 65–99)

## 2016-02-12 LAB — CBC
HCT: 36.8 % — ABNORMAL LOW (ref 39.0–52.0)
Hemoglobin: 11.6 g/dL — ABNORMAL LOW (ref 13.0–17.0)
MCH: 28.5 pg (ref 26.0–34.0)
MCHC: 31.5 g/dL (ref 30.0–36.0)
MCV: 90.4 fL (ref 78.0–100.0)
PLATELETS: 193 10*3/uL (ref 150–400)
RBC: 4.07 MIL/uL — ABNORMAL LOW (ref 4.22–5.81)
RDW: 13.5 % (ref 11.5–15.5)
WBC: 24.7 10*3/uL — ABNORMAL HIGH (ref 4.0–10.5)

## 2016-02-12 LAB — LACTIC ACID, PLASMA: LACTIC ACID, VENOUS: 2 mmol/L (ref 0.5–2.0)

## 2016-02-12 MED ORDER — SODIUM CHLORIDE 0.9 % IV BOLUS (SEPSIS)
500.0000 mL | Freq: Once | INTRAVENOUS | Status: AC
  Administered 2016-02-12: 500 mL via INTRAVENOUS

## 2016-02-12 MED ORDER — POLYETHYLENE GLYCOL 3350 17 G PO PACK
17.0000 g | PACK | Freq: Every day | ORAL | Status: DC
  Administered 2016-02-13 – 2016-02-14 (×2): 17 g via ORAL
  Filled 2016-02-12 (×3): qty 1

## 2016-02-12 MED ORDER — INSULIN GLARGINE 100 UNIT/ML ~~LOC~~ SOLN
15.0000 [IU] | Freq: Every day | SUBCUTANEOUS | Status: DC
  Administered 2016-02-13 – 2016-02-14 (×3): 15 [IU] via SUBCUTANEOUS
  Filled 2016-02-12 (×4): qty 0.15

## 2016-02-12 MED ORDER — SODIUM CHLORIDE 0.9 % IV SOLN: INTRAVENOUS | Status: AC

## 2016-02-12 MED ORDER — PIPERACILLIN-TAZOBACTAM 3.375 G IVPB
3.3750 g | Freq: Three times a day (TID) | INTRAVENOUS | Status: DC
  Administered 2016-02-12 – 2016-02-15 (×9): 3.375 g via INTRAVENOUS
  Filled 2016-02-12 (×11): qty 50

## 2016-02-12 MED ORDER — SODIUM CHLORIDE 0.9 % IV SOLN
INTRAVENOUS | Status: AC
  Administered 2016-02-12: 01:00:00 via INTRAVENOUS

## 2016-02-12 NOTE — Progress Notes (Signed)
Attempted to get report from ED RN.  RN stated he will call back.

## 2016-02-12 NOTE — Care Management Note (Addendum)
Case Management Note  Patient Details  Name: JHACE RADHAKRISHNAN MRN: [id number] Date of Birth: 07/08/1932  Subjective/Objective:                 Patient admitted from home with wife. Has walker. UTI, IV Abx, Foley in for retention. Patient requiring O2, non dependent at home.   Action/Plan:  Watch for O2 at DC  Addendum 02-15-16 Patient declined [place]. Patient satting well on RA in chair. Voiding trial underway, started on Flomax yesterday. Anticipate DC to home with wife today.   Expected Discharge Date:                  Expected Discharge Plan:     In-House Referral:     Discharge planning Services  CM Consult  Post Acute Care Choice:    Choice offered to:     DME Arranged:    DME Agency:     HH Arranged:    HH Agency:     Status of Service:  In process, will continue to follow  Medicare Important Message Given:    Date Medicare IM Given:    Medicare IM give by:    Date Additional Medicare IM Given:    Additional Medicare Important Message give by:     If discussed at [place] of Stay Meetings, dates discussed:    Additional Comments:  Carles Collet, RN 02/12/2016, 3:14 PM

## 2016-02-12 NOTE — Progress Notes (Addendum)
PROGRESS NOTE    [name]  [id number] DOB: [birth year] DOA: 02/11/2016 PCP: Stephens Shire, MD (Confirm with patient/family/NH records and if not entered, this HAS to be entered at [facility] point of entry. "No PCP" if truly none.)   Brief Narrative: (Start on day 1 of progress note - keep it brief and live) Urinary tract infection, 82 yo male with T2dm and htn. Complained of urinary retention but  only 30 to 40 cc in the bladder when foley was placed. Started on antibiotic therapy, follow on renal US.   Assessment & Plan:   Principal Problem:   UTI (lower urinary tract infection) Active Problems:   Essential hypertension, benign   OSA on CPAP   CKD stage 3 due to type 2 diabetes mellitus ([place])  1. Cardiovascular. Will continue antibiotic therapy with Zosyn IV, follow on cell count, temperature curve and cultures. Hydration with saline. Will hold on carvedilol and isosorbide due to risk of hypotension, blood pressure systolic 97 to [id number]. Will bolus ns if needed to keep MAP 65.   2, Pulmonary. Will continue to monitor oxymetry, continue 02 per [place] to target 02 sat above 92%. CT personally reviewed lung windows no significant infiltrates, chest film personally reviewed with no infiltrates  3, Nephrology. Renal function with cr at 1,54 from 1,63, will follow on renal panel in am, avoid hypotension or nephrotoxic medications. Will continue saline at 75 cc/hr and bolus as needed.  4, Neurology. Patient awake and alert, will continue neuro checks per unit protocol.    5, GU. Urine infection, will follow on renal US negative for obstructive uropathy. Plan for voiding trial before discharge.  6. GI. Pneumobilia on gallbladder, will continue on antibiotic therapy, patient with no abdominal pain, no nausea or vomiting, abdomen soft and non tender, will get dedicated stat US abdomen to follow on these effect. For now will up escalate antibiotic therapy to ZOsyn. Will plan to call surgery if  abnormal [place] or signs of cholecystitis .   7. Endocrinology,. Will reduce dose of insulin due to risk of hypoglycemia. Continue insulin sliding scale.   Patient is at moderate risk for worsening infection.     DVT prophylaxis: (Lovenox/Heparin/SCD's/anticoagulated/None (if comfort care) Code Status: (Full/Partial - specify details) Family Communication: (Specify name, relationship & date discussed. NO "discussed with patient") Disposition Plan: (specify when and where you expect patient to be discharged). Include barriers to DC in this tab.   Consultants:     Procedures: (Don't include imaging studies which can be auto populated. Include things that cannot be auto populated i.e. Echo, Carotid and venous dopplers, Foley, Bipap, HD, tubes/drains, wound vac, central lines etc)    Antimicrobials: (specify start and planned stop date. Auto populated tables are space occupying and do not give end dates)  Zosyn   Subjective: Patient feeling better, mild discomfort at the site of the foley catheter, no nausea or vomiting, no chest pain or dyspnea, tolerating po well. No significant abdominal pain.    Objective: Filed Vitals:   02/12/16 0112 02/12/16 0114 02/12/16 0134 02/12/16 0615  BP:  192/156 113/67 113/58  Pulse:  126 96 92  Temp: 98.8 F (37.1 C) 98.8 F (37.1 C)  98.3 F (36.8 C)  TempSrc: Oral   Oral  Resp:  18  20  Height: 5\' 11"  (1.803 m)     Weight: 134.3 kg (296 lb 1.2 oz)     SpO2:  100%  95%    Intake/Output Summary (Last 24  hours) at 02/12/16 1149 Last data filed at 02/12/16 1100  Gross per 24 hour  Intake 1006.67 ml  Output    350 ml  Net 656.67 ml   Filed Weights   02/12/16 0112  Weight: 134.3 kg (296 lb 1.2 oz)    Examination:  General exam: not in pain, positive mild dyspnea E ENT: mild conjunctival pallor, oral mucosa moist. Respiratory system: Respiratory effort normal. Mild decrease breath sounds at bases with poor inspiratory effort.    Cardiovascular system: S1 & S2 heard, RRR. No JVD, murmurs, rubs, gallops or clicks. No pedal edema. Gastrointestinal system: Abdomen is nondistended, soft and nontender. No organomegaly or masses felt. Normal bowel sounds heard. Central nervous system: Alert and oriented. No focal neurological deficits. Extremities: Symmetric 5 x 5 power. Skin: No rashes, lesions or ulcers Psychiatry: Judgement and insight appear normal. Mood & affect appropriate.     Data Reviewed: I have personally reviewed following labs and imaging studies  CBC:  Recent Labs Lab 02/11/16 2013 02/12/16 0532  WBC 25.4* 24.7*  HGB 12.5* 11.6*  HCT 38.9* 36.8*  MCV 89.8 90.4  PLT 229 0000000   Basic Metabolic Panel:  Recent Labs Lab 02/11/16 2013 02/12/16 0532  NA 135 137  K 4.4 4.0  CL 102 106  CO2 23 24  GLUCOSE 214* 228*  BUN 31* 30*  CREATININE 1.63* 1.54*  CALCIUM 8.9 8.3*   GFR: Estimated Creatinine Clearance: 52.6 mL/min (by C-G formula based on Cr of 1.54). Liver Function Tests:  Recent Labs Lab 02/11/16 2013  AST 27  ALT 18  ALKPHOS 87  BILITOT 1.2  PROT 6.4*  ALBUMIN 3.1*   No results for input(s): LIPASE, AMYLASE in the last 168 hours. No results for input(s): AMMONIA in the last 168 hours. Coagulation Profile: No results for input(s): INR, PROTIME in the last 168 hours. Cardiac Enzymes: No results for input(s): CKTOTAL, CKMB, CKMBINDEX, TROPONINI in the last 168 hours. BNP (last 3 results) No results for input(s): PROBNP in the last 8760 hours. HbA1C: No results for input(s): HGBA1C in the last 72 hours. CBG:  Recent Labs Lab 02/12/16 0134 02/12/16 0753  GLUCAP 227* 208*   Lipid Profile: No results for input(s): CHOL, HDL, LDLCALC, TRIG, CHOLHDL, LDLDIRECT in the last 72 hours. Thyroid Function Tests: No results for input(s): TSH, T4TOTAL, FREET4, T3FREE, THYROIDAB in the last 72 hours. Anemia Panel: No results for input(s): VITAMINB12, FOLATE, FERRITIN, TIBC,  IRON, RETICCTPCT in the last 72 hours. Sepsis Labs:  Recent Labs Lab 02/11/16 2013 02/11/16 2245  LATICACIDVEN 1.6 2.0    No results found for this or any previous visit (from the past 240 hour(s)).       Radiology Studies: Dg Chest 2 View  02/11/2016  CLINICAL DATA:  Several recent falls.  Shortness of breath EXAM: CHEST  2 VIEW COMPARISON:  November 22, 2012 FINDINGS: There is no edema or consolidation. Heart is upper normal in size with pulmonary vascularity within normal limits. No adenopathy. No bone lesions. IMPRESSION: No edema or consolidation. Electronically Signed   By: Lowella Grip III M.D.   On: 02/11/2016 20:28   Ct Angio Chest Pe W Or Wo Contrast  02/11/2016  CLINICAL DATA:  Shortness of breath at rest but worse with exertion. History of diabetes and pneumonia. EXAM: CT ANGIOGRAPHY CHEST WITH CONTRAST TECHNIQUE: Multidetector CT imaging of the chest was performed using the standard protocol during bolus administration of intravenous contrast. Multiplanar CT image reconstructions and MIPs were obtained  to evaluate the vascular anatomy. CONTRAST:  100 mL Isovue 370 COMPARISON:  None. FINDINGS: Technically adequate study with moderately good opacification of the central and proximal segmental pulmonary arteries. No filling defects demonstrated in the visualize central vessels suggesting no significant central pulmonary embolus. Peripheral vessels are not well evaluated due to technique. Normal heart size. Normal caliber thoracic aorta. Calcification in the aorta and coronary arteries. No aortic dissection. Great vessel origins are patent. No significant lymphadenopathy in the chest. Esophagus is decompressed. Evaluation of lungs is limited due to respiratory motion. Atelectasis in the lung bases. No gross consolidation. No pleural effusions. No pneumothorax. Airways appear patent. Included portions of the upper abdominal organs demonstrate pneumobilia. This is likely postoperative.  Correlate for history of sphincterotomy. If there is no history of surgery, infection or fistula could also cause this finding. The visualized gallbladder is normal except for pneumobilia. Fatty infiltration of the pancreas. Degenerative changes in the spine. No destructive bone lesions. Review of the MIP images confirms the above findings. IMPRESSION: No evidence of central pulmonary embolus. No evidence of active pulmonary disease. Nonspecific pneumobilia, likely postoperative. Electronically Signed   By: Lucienne Capers M.D.   On: 02/11/2016 23:17        Scheduled Meds: . sodium chloride   Intravenous STAT  . carvedilol  3.125 mg Oral BID WC  . cefTRIAXone (ROCEPHIN)  IV  1 g Intravenous Q24H  . cholecalciferol  2,000 Units Oral QHS  . docusate sodium  100 mg Oral BID  . enoxaparin (LOVENOX) injection  40 mg Subcutaneous Q24H  . galantamine  16 mg Oral Q breakfast  . insulin aspart  0-15 Units Subcutaneous TID WC  . insulin glargine  30 Units Subcutaneous QHS  . isosorbide mononitrate  30 mg Oral Daily  . lisinopril  20 mg Oral Daily  . magnesium oxide  400 mg Oral BID  . pantoprazole  40 mg Oral BID  . polyvinyl alcohol  1 drop Both Eyes BID  . QUEtiapine  50 mg Oral QHS  . simvastatin  40 mg Oral QPM  . tobramycin-dexamethasone  1 drop Both Eyes Q4H while awake   Continuous Infusions:    LOS: 1 day        Jesus Nevills Gerome Apley, MD [facility] Pager [phone]  If 7PM-7AM, please contact night-coverage [email or web address] Password [facility] 02/12/2016, 11:49 AM

## 2016-02-12 NOTE — Procedures (Signed)
Placed patient on CPAP for the night.  Patient is tolerating well at this time. 

## 2016-02-12 NOTE — ED Notes (Signed)
Report attempted 

## 2016-02-12 NOTE — ED Notes (Signed)
Report attempted for second time.

## 2016-02-12 NOTE — Progress Notes (Signed)
Pharmacy Antibiotic Note  [name] is a 82 y.o. male admitted on 02/11/2016 with intr-abdominal infection.  Pharmacy has been consulted for Zosyn dosing.  Plan: Zosyn 3.375g IV q8h (4 hour infusion).  Pharmacy to sign off  Height: 5\' 11"  (180.3 cm) Weight: 296 lb 1.2 oz (134.3 kg) IBW/kg (Calculated) : 75.3  Temp (24hrs), Avg:99.2 F (37.3 C), Min:98.3 F (36.8 C), Max:100.8 F (38.2 C)   Recent Labs Lab 02/11/16 2013 02/11/16 2245 02/12/16 0532  WBC 25.4*  --  24.7*  CREATININE 1.63*  --  1.54*  LATICACIDVEN 1.6 2.0  --     Estimated Creatinine Clearance: 52.6 mL/min (by C-G formula based on Cr of 1.54).    No Known Allergies   Thank you  Anette Guarneri, PharmD [phone] 02/12/2016 5:21 PM

## 2016-02-12 NOTE — Progress Notes (Signed)
Received report from ED RN.

## 2016-02-12 NOTE — Progress Notes (Signed)
Inpatient Diabetes Program Recommendations  AACE/ADA: New Consensus Statement on Inpatient Glycemic Control (2015)  Target Ranges:  Prepandial:   less than 140 mg/dL      Peak postprandial:   less than 180 mg/dL (1-2 hours)      Critically ill patients:  140 - 180 mg/dL  Results for [name], [name] (MRN [id number]) as of 02/12/2016 10:13  Ref. Range 02/12/2016 01:34 02/12/2016 07:53  Glucose-Capillary Latest Ref Range: 65-99 mg/dL 227 (H) 208 (H)    Review of Glycemic Control  Diabetes history: DM2 Outpatient Diabetes medications:  Lantus 42 units QHS, Novolog 0-15 units TID with meals Current orders for Inpatient glycemic control: Lantus 30 units QHS, Novolog 0-15 units TID with meals  Inpatient Diabetes Program Recommendations: Insulin - Basal: Please consider increasing Lantus to 34 units QHS (based on 134 kg x 0.25 units). Correction (SSI): Please consider ordering Novolog bedtime correction scale.  Thanks, Barnie Alderman, RN, MSN, CDE Diabetes Coordinator Inpatient Diabetes Program [phone] (Team Pager from [place] to [place]) [phone] ([place] office) [phone] [facility] office) [phone] [facility] office)

## 2016-02-13 ENCOUNTER — Inpatient Hospital Stay (HOSPITAL_COMMUNITY): Payer: Medicare Other

## 2016-02-13 DIAGNOSIS — K838 Other specified diseases of biliary tract: Secondary | ICD-10-CM | POA: Diagnosis present

## 2016-02-13 DIAGNOSIS — N10 Acute tubulo-interstitial nephritis: Principal | ICD-10-CM

## 2016-02-13 LAB — CBC WITH DIFFERENTIAL/PLATELET
Basophils Absolute: 0 10*3/uL (ref 0.0–0.1)
Basophils Relative: 0 %
EOS PCT: 2 %
Eosinophils Absolute: 0.4 10*3/uL (ref 0.0–0.7)
HEMATOCRIT: 35 % — AB (ref 39.0–52.0)
HEMOGLOBIN: 11.2 g/dL — AB (ref 13.0–17.0)
LYMPHS ABS: 1.9 10*3/uL (ref 0.7–4.0)
Lymphocytes Relative: 9 %
MCH: 29.2 pg (ref 26.0–34.0)
MCHC: 32 g/dL (ref 30.0–36.0)
MCV: 91.4 fL (ref 78.0–100.0)
MONOS PCT: 11 %
Monocytes Absolute: 2.3 10*3/uL — ABNORMAL HIGH (ref 0.1–1.0)
Neutro Abs: 16 10*3/uL — ABNORMAL HIGH (ref 1.7–7.7)
Neutrophils Relative %: 78 %
Platelets: ADEQUATE 10*3/uL (ref 150–400)
RBC: 3.83 MIL/uL — AB (ref 4.22–5.81)
RDW: 13.5 % (ref 11.5–15.5)
WBC Morphology: INCREASED
WBC: 20.6 10*3/uL — AB (ref 4.0–10.5)

## 2016-02-13 LAB — BASIC METABOLIC PANEL
ANION GAP: 9 (ref 5–15)
BUN: 36 mg/dL — ABNORMAL HIGH (ref 6–20)
CO2: 21 mmol/L — ABNORMAL LOW (ref 22–32)
Calcium: 8.2 mg/dL — ABNORMAL LOW (ref 8.9–10.3)
Chloride: 107 mmol/L (ref 101–111)
Creatinine, Ser: 1.29 mg/dL — ABNORMAL HIGH (ref 0.61–1.24)
GFR, EST AFRICAN AMERICAN: 58 mL/min — AB (ref >60)
GFR, EST NON AFRICAN AMERICAN: 50 mL/min — AB (ref >60)
GLUCOSE: 123 mg/dL — AB (ref 65–99)
POTASSIUM: 5.3 mmol/L — AB (ref 3.5–5.1)
Sodium: 137 mmol/L (ref 135–145)

## 2016-02-13 LAB — GLUCOSE, CAPILLARY
GLUCOSE-CAPILLARY: 179 mg/dL — AB (ref 65–99)
Glucose-Capillary: 117 mg/dL — ABNORMAL HIGH (ref 65–99)
Glucose-Capillary: 128 mg/dL — ABNORMAL HIGH (ref 65–99)
Glucose-Capillary: 129 mg/dL — ABNORMAL HIGH (ref 65–99)

## 2016-02-13 LAB — POTASSIUM: POTASSIUM: 3.8 mmol/L (ref 3.5–5.1)

## 2016-02-13 MED ORDER — INSULIN ASPART 100 UNIT/ML ~~LOC~~ SOLN
0.0000 [IU] | Freq: Three times a day (TID) | SUBCUTANEOUS | Status: DC
  Administered 2016-02-13 – 2016-02-14 (×3): 2 [IU] via SUBCUTANEOUS
  Administered 2016-02-14 – 2016-02-15 (×2): 3 [IU] via SUBCUTANEOUS

## 2016-02-13 MED ORDER — INSULIN GLARGINE 100 UNIT/ML ~~LOC~~ SOLN: 15.0000 [IU] | Freq: Every day | SUBCUTANEOUS | Status: DC

## 2016-02-13 MED ORDER — SODIUM CHLORIDE 0.9 % IV SOLN
INTRAVENOUS | Status: DC
  Administered 2016-02-13: 18:00:00 via INTRAVENOUS
  Administered 2016-02-14 – 2016-02-15 (×2): 1000 mL via INTRAVENOUS

## 2016-02-13 MED ORDER — OXYCODONE-ACETAMINOPHEN 5-325 MG PO TABS
1.0000 | ORAL_TABLET | ORAL | Status: DC | PRN
  Administered 2016-02-14: 1 via ORAL
  Filled 2016-02-13: qty 1

## 2016-02-13 NOTE — Progress Notes (Signed)
PROGRESS NOTE    [name]  [id number] DOB: 1931/10/18 DOA: 02/11/2016 PCP: Stephens Shire, MD    Brief Narrative:  82 year old gentleman history of diabetes well controlled, hypertension, obstructive sleep apnea on C Pap presented to the ED with a four-day history of urinary retention, dysuria and left flank tenderness. Urinalysis worrisome for UTI. Patient also noted to have pneumobilia on CT chest and on abdominal ultrasound. General surgery consulted and at this time no surgical intervention needed. Patient has been pancultured and patient on empiric IV Zosyn being treated for an acute pyelonephritis/UTI.   Assessment & Plan:   Principal Problem:   Acute pyelonephritis: Probable Active Problems:   UTI (lower urinary tract infection)   Pneumobilia   Essential hypertension, benign   OSA on CPAP   CKD stage 3 due to type 2 diabetes mellitus ([place])   #1 probable acute pyelonephritis/UTI Patient presented with complaints of dysuria, left flank tenderness and noted to have a significant leukocytosis with a white count of 24.7. Urinalysis consistent with a UTI. Check urine cultures. Blood cultures pending. Continue empiric IV Zosyn. IV fluids. Pain management. Supportive care.  #2 pneumobilia Per CT chest and also noted on abdominal ultrasound. Patient is currently afebrile. Patient however with a significant leukocytosis. Patient denies any abdominal pain or right upper quadrant pain. Patient has been seen in consultation by general surgery who feel at this time patient does not have any evidence of cholecystitis and no surgical intervention is needed at this time. Supportive care.  #3 hypertension Stable. BP meds on hold as patient's blood pressure was borderline over the past couple of days. I have #4 obstructive sleep apnea C Pap daily at bedtime.  #5 hyperkalemia Repeat potassium levels.  #6 chronic kidney disease stage III Stable.  #7 type 2 diabetes Hemoglobin A1c  was 6.8 in 2014. Will check a hemoglobin A1c. Place one half home dose Lantus at this time. Sliding scale insulin.   DVT prophylaxis: Lovenox Code Status: Full Family Communication: Updated patient and wife at bedside. Disposition Plan: Home when medically stable.   Consultants:   General surgery: Dr. Rosendo Gros 02/13/2016  Procedures:   Renal ultrasound 02/12/2016  Abdominal ultrasound 02/13/2016  Chest x-ray 02/11/2016  CT angiogram chest 02/11/2016  Antimicrobials:   IV Zosyn 02/12/2016  IV Rocephin 02/11/2016>>>> 02/12/2016   Subjective: Patient denies any shortness of breath. No chest pain. No abdominal pain. Patient states he is hungry. Patient complaining of dysuria and left flank pain.  Objective: Filed Vitals:   02/12/16 2252 02/13/16 0322 02/13/16 0335 02/13/16 0424  BP:  223/164 125/60 117/62  Pulse: 81 92 89 89  Temp:      TempSrc:      Resp: 20 18 18 20   Height:      Weight:      SpO2: 93% 88% 94% 99%    Intake/Output Summary (Last 24 hours) at 02/13/16 1246 Last data filed at 02/13/16 1044  Gross per 24 hour  Intake 773.75 ml  Output   1425 ml  Net -651.25 ml   Filed Weights   02/12/16 0112  Weight: 134.3 kg (296 lb 1.2 oz)    Examination:  General exam: Appears calm and comfortable  Respiratory system: Clear to auscultation. Respiratory effort normal. Cardiovascular system: S1 & S2 heard, RRR. No JVD, murmurs, rubs, gallops or clicks. No pedal edema. Gastrointestinal system: Abdomen is obese, nondistended, soft and nontender. No organomegaly or masses felt. Normal bowel sounds heard. Positive left CVA tenderness Central  nervous system: Alert and oriented. No focal neurological deficits. Extremities: Symmetric 5 x 5 power. Skin: No rashes, lesions or ulcers Psychiatry: Judgement and insight appear normal. Mood & affect appropriate.     Data Reviewed: I have personally reviewed following labs and imaging studies  CBC:  Recent  Labs Lab 02/11/16 2013 02/12/16 0532 02/13/16 0550  WBC 25.4* 24.7* 20.6*  NEUTROABS  --   --  16.0*  HGB 12.5* 11.6* 11.2*  HCT 38.9* 36.8* 35.0*  MCV 89.8 90.4 91.4  PLT 229 193 PLATELET CLUMPS NOTED ON SMEAR, COUNT APPEARS ADEQUATE   Basic Metabolic Panel:  Recent Labs Lab 02/11/16 2013 02/12/16 0532 02/13/16 0550 02/13/16 0908  NA 135 137 137  --   K 4.4 4.0 5.3* 3.8  CL 102 106 107  --   CO2 23 24 21*  --   GLUCOSE 214* 228* 123*  --   BUN 31* 30* 36*  --   CREATININE 1.63* 1.54* 1.29*  --   CALCIUM 8.9 8.3* 8.2*  --    GFR: Estimated Creatinine Clearance: 62.8 mL/min (by C-G formula based on Cr of 1.29). Liver Function Tests:  Recent Labs Lab 02/11/16 2013  AST 27  ALT 18  ALKPHOS 87  BILITOT 1.2  PROT 6.4*  ALBUMIN 3.1*   No results for input(s): LIPASE, AMYLASE in the last 168 hours. No results for input(s): AMMONIA in the last 168 hours. Coagulation Profile: No results for input(s): INR, PROTIME in the last 168 hours. Cardiac Enzymes: No results for input(s): CKTOTAL, CKMB, CKMBINDEX, TROPONINI in the last 168 hours. BNP (last 3 results) No results for input(s): PROBNP in the last 8760 hours. HbA1C: No results for input(s): HGBA1C in the last 72 hours. CBG:  Recent Labs Lab 02/12/16 1229 02/12/16 1724 02/12/16 2233 02/13/16 0744 02/13/16 1152  GLUCAP 175* 139* 147* 117* 128*   Lipid Profile: No results for input(s): CHOL, HDL, LDLCALC, TRIG, CHOLHDL, LDLDIRECT in the last 72 hours. Thyroid Function Tests: No results for input(s): TSH, T4TOTAL, FREET4, T3FREE, THYROIDAB in the last 72 hours. Anemia Panel: No results for input(s): VITAMINB12, FOLATE, FERRITIN, TIBC, IRON, RETICCTPCT in the last 72 hours. Sepsis Labs:  Recent Labs Lab 02/11/16 2013 02/11/16 2245  LATICACIDVEN 1.6 2.0    Recent Results (from the past 240 hour(s))  Blood Culture (routine x 2)     Status: None (Preliminary result)   Collection Time: 02/11/16  7:58 PM   Result Value Ref Range Status   Specimen Description BLOOD RIGHT ANTECUBITAL  Final   Special Requests BOTTLES DRAWN AEROBIC AND ANAEROBIC 5CC  Final   Culture NO GROWTH < 24 HOURS  Final   Report Status PENDING  Incomplete  Blood Culture (routine x 2)     Status: None (Preliminary result)   Collection Time: 02/11/16  8:13 PM  Result Value Ref Range Status   Specimen Description BLOOD RIGHT HAND  Final   Special Requests BOTTLES DRAWN AEROBIC AND ANAEROBIC 5CC  Final   Culture NO GROWTH < 24 HOURS  Final   Report Status PENDING  Incomplete         Radiology Studies: Dg Chest 2 View  02/11/2016  CLINICAL DATA:  Several recent falls.  Shortness of breath EXAM: CHEST  2 VIEW COMPARISON:  November 22, 2012 FINDINGS: There is no edema or consolidation. Heart is upper normal in size with pulmonary vascularity within normal limits. No adenopathy. No bone lesions. IMPRESSION: No edema or consolidation. Electronically Signed  By: Lowella Grip III M.D.   On: 02/11/2016 20:28   Ct Angio Chest Pe W Or Wo Contrast  02/11/2016  CLINICAL DATA:  Shortness of breath at rest but worse with exertion. History of diabetes and pneumonia. EXAM: CT ANGIOGRAPHY CHEST WITH CONTRAST TECHNIQUE: Multidetector CT imaging of the chest was performed using the standard protocol during bolus administration of intravenous contrast. Multiplanar CT image reconstructions and MIPs were obtained to evaluate the vascular anatomy. CONTRAST:  100 mL Isovue 370 COMPARISON:  None. FINDINGS: Technically adequate study with moderately good opacification of the central and proximal segmental pulmonary arteries. No filling defects demonstrated in the visualize central vessels suggesting no significant central pulmonary embolus. Peripheral vessels are not well evaluated due to technique. Normal heart size. Normal caliber thoracic aorta. Calcification in the aorta and coronary arteries. No aortic dissection. Great vessel origins are  patent. No significant lymphadenopathy in the chest. Esophagus is decompressed. Evaluation of lungs is limited due to respiratory motion. Atelectasis in the lung bases. No gross consolidation. No pleural effusions. No pneumothorax. Airways appear patent. Included portions of the upper abdominal organs demonstrate pneumobilia. This is likely postoperative. Correlate for history of sphincterotomy. If there is no history of surgery, infection or fistula could also cause this finding. The visualized gallbladder is normal except for pneumobilia. Fatty infiltration of the pancreas. Degenerative changes in the spine. No destructive bone lesions. Review of the MIP images confirms the above findings. IMPRESSION: No evidence of central pulmonary embolus. No evidence of active pulmonary disease. Nonspecific pneumobilia, likely postoperative. Electronically Signed   By: Lucienne Capers M.D.   On: 02/11/2016 23:17   US Renal  02/12/2016  CLINICAL DATA:  urinary retention, diabetes EXAM: RENAL / URINARY TRACT ULTRASOUND COMPLETE COMPARISON:  None. FINDINGS: Right Kidney: Length: 12.9 cm. There is cortical thinning probable due to atrophy Echogenicity within normal limits. No mass or hydronephrosis visualized. Left Kidney: Length: 12.9 cm. Cortical thinning probable due to atrophy. Echogenicity within normal limits. No hydronephrosis. There is a cyst in lower pole measures 1.3 x 1.4 cm. Bladder: Urinary bladder is decompressed by Foley catheter. IMPRESSION: 1. No hydronephrosis. Bilateral renal cortical thinning probable due to atrophy. Cyst in lower pole of the left kidney measures 1.3 x 1.4 cm. Foley catheter within decompressed urinary bladder. Electronically Signed   By: Lahoma Crocker M.D.   On: 02/12/2016 16:44   US Abdomen Limited  02/13/2016  CLINICAL DATA:  Pneumobilia noted on chest CTA. EXAM: US ABDOMEN LIMITED - RIGHT UPPER QUADRANT COMPARISON:  Chest CT 02/11/2016. FINDINGS: Gallbladder: Physiologically distended.  Sludge and stones within the gallbladder, largest stone 12 mm. No gallbladder wall thickening, wall thickness 2.4 mm. Mobile echogenicity noted on sending clips likely air in the gallbladder lumen. No sonographic Murphy sign noted by sonographer. Common bile duct: Diameter: 5 mm. Liver: Pneumobilia on prior CT is not well seen sonographically. No focal lesion identified. Borderline increased in parenchymal echogenicity. Normal directional flow in the main portal vein. IMPRESSION: 1. Pneumobilia suspected in the gallbladder. There is gallbladder sludge and stones, no wall thickening or findings of acute cholecystitis. 2. Pneumobilia in liver on CT not well visualized sonographically. Electronically Signed   By: Jeb Levering M.D.   On: 02/13/2016 01:33        Scheduled Meds: . cholecalciferol  2,000 Units Oral QHS  . docusate sodium  100 mg Oral BID  . enoxaparin (LOVENOX) injection  40 mg Subcutaneous Q24H  . galantamine  16 mg Oral Q  breakfast  . insulin aspart  0-15 Units Subcutaneous TID WC  . insulin glargine  15 Units Subcutaneous QHS  . magnesium oxide  400 mg Oral BID  . pantoprazole  40 mg Oral BID  . piperacillin-tazobactam (ZOSYN)  IV  3.375 g Intravenous Q8H  . polyethylene glycol  17 g Oral Daily  . polyvinyl alcohol  1 drop Both Eyes BID  . QUEtiapine  50 mg Oral QHS  . simvastatin  40 mg Oral QPM  . tobramycin-dexamethasone  1 drop Both Eyes Q4H while awake   Continuous Infusions:    LOS: 2 days    Time spent: 70 mins    Lateefah Mallery, MD [facility] Pager [phone]  If 7PM-7AM, please contact night-coverage [email or web address] Password [facility] 02/13/2016, 12:46 PM

## 2016-02-13 NOTE — Progress Notes (Signed)
Received call from MD wanting to know why Abdominal US had not been done.  The Nurse handing off report told me he was to be NPO after midnight for the procedure. I called [place] and he had the [place] approximately 0100
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
## 2016-02-13 NOTE — Evaluation (Signed)
Physical Therapy Evaluation Patient Details Name: [name] MRN: [id number] DOB: Jul 01, 1932 Today's Date: 02/13/2016   History of Present Illness  Pt is a 82 y/o M who was brought in by EMS secondary to falling at home and dehyrdration per pt and his wife. Pt was worked up in the ED and was found to have UTI. Pt was also seen to have pneumobilia on [place]. Pt with no nausea or emesis or RUQ abd pain.   Clinical Impression  Pt admitted with above diagnosis. Pt currently with functional limitations due to the deficits listed below (see PT Problem List). Pt refused to get OOB as he states "I have to get rid of the infection first."  Will follow acutely to address mobility as pt allows.  Pt states he does not want to go back to a SNF for therapy as "their food is awful."  Pt states he will do fine once the infection is gone.  Pt desats to 84-87% on RA with talking and with rolling inbed.   Pt states that nurse removed O2 earlier.  At rest his sats are staying up above 90%.  Pt will benefit from skilled PT to increase their independence and safety with mobility to allow discharge to the venue listed below.      Follow Up Recommendations Home health PT;Supervision/Assistance - 24 hour (pt refuses SNF as he went before)    Equipment Recommendations  None recommended by PT    Recommendations for Other Services       Precautions / Restrictions Precautions Precautions: Fall Restrictions Weight Bearing Restrictions: No      Mobility  Bed Mobility Overal bed mobility: Needs Assistance Bed Mobility: Rolling;Supine to Sit Rolling: Min assist         General bed mobility comments: Pt was able to roll with cues and assist.  Pt refused to get to EOB. Pt states he has to get rid of infection first.   Transfers                    Ambulation/Gait                Stairs            Wheelchair Mobility    Modified Rankin (Stroke Patients Only)       Balance                                             Pertinent Vitals/Pain Pain Assessment: 0-10 Pain Score: 4  Pain Location: bil knees Pain Descriptors / Indicators: Aching Pain Intervention(s): Limited activity within patient's tolerance;Monitored during session;Repositioned  Desats with rolling and talking.  Rebounds quickly to >90% on RA with rest.    Home Living   Living Arrangements: Spouse/significant other Available Help at Discharge: Family;Available PRN/intermittently (wife can't physically assist) Type of Home: House Home Access: Stairs to enter Entrance Stairs-Rails: Right Entrance Stairs-Number of Steps: 13 Home Layout: Two level Home Equipment: Bedside commode;Cane - single point;Walker - 2 wheels;Walker - 4 wheels;Grab bars - tub/shower;Shower seat;Wheelchair - Education officer, community - power Additional Comments: wife can't asist at home.    Prior Function Level of Independence: Independent with assistive device(s)         Comments: used cane or rollator out of house, held to [employer] and couches in house     Hand Dominance  Extremity/Trunk Assessment   Upper Extremity Assessment: Defer to OT evaluation           Lower Extremity Assessment: Overall WFL for tasks assessed         Communication   Communication: HOH  Cognition Arousal/Alertness: Awake/alert Behavior During Therapy: Anxious Overall Cognitive Status: History of cognitive impairments - at baseline                      General Comments      Exercises General Exercises - Upper Extremity Shoulder Flexion: AROM;Both;5 reps;Supine Elbow Flexion: AROM;Both;5 reps;Supine General Exercises - Lower Extremity Ankle Circles/Pumps: AROM;Both;10 reps;Supine Heel Slides: AROM;Both;5 reps;Supine Straight Leg Raises: AROM;Both;5 reps;Supine      Assessment/Plan    PT Assessment Patient needs continued PT services  PT Diagnosis Generalized weakness   PT Problem List  Decreased activity tolerance;Decreased balance;Decreased mobility;Decreased knowledge of use of DME;Decreased safety awareness;Cardiopulmonary status limiting activity;Decreased knowledge of precautions;Obesity  PT Treatment Interventions DME instruction;Gait training;Functional mobility training;Therapeutic exercise;Therapeutic activities;Balance training;Patient/family education;Stair training   PT Goals (Current goals can be found in the Care Plan section) Acute Rehab PT Goals Patient Stated Goal: to go home PT Goal Formulation: With patient Time For Goal Achievement: 02/27/16 Potential to Achieve Goals: Good    Frequency Min 3X/week   Barriers to discharge Decreased caregiver support (wife can only provide supervision)      Co-evaluation               End of Session Equipment Utilized During Treatment: Gait belt;Oxygen Activity Tolerance: Patient limited by fatigue Patient left: in bed;with call bell/phone within reach;with family/visitor present;with bed alarm set Nurse Communication: Mobility status         Time: [id number] PT Time Calculation (min) (ACUTE ONLY): 11 min   Charges:   PT Evaluation $PT Eval Low Complexity: 1 Procedure     PT G CodesDenice Paradise 02/24/16, 2:18 PM  Akbar Sacra [facility] Acute Rehabilitation [phone] (pager)
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
## 2016-02-14 DIAGNOSIS — R6510 Systemic inflammatory response syndrome (SIRS) of non-infectious origin without acute organ dysfunction: Secondary | ICD-10-CM

## 2016-02-14 DIAGNOSIS — E876 Hypokalemia: Secondary | ICD-10-CM

## 2016-02-14 LAB — COMPREHENSIVE METABOLIC PANEL
ALT: 29 U/L (ref 17–63)
ANION GAP: 7 (ref 5–15)
AST: 33 U/L (ref 15–41)
Albumin: 2.3 g/dL — ABNORMAL LOW (ref 3.5–5.0)
Alkaline Phosphatase: 110 U/L (ref 38–126)
BUN: 23 mg/dL — ABNORMAL HIGH (ref 6–20)
CHLORIDE: 107 mmol/L (ref 101–111)
CO2: 26 mmol/L (ref 22–32)
CREATININE: 1.08 mg/dL (ref 0.61–1.24)
Calcium: 8.3 mg/dL — ABNORMAL LOW (ref 8.9–10.3)
Glucose, Bld: 138 mg/dL — ABNORMAL HIGH (ref 65–99)
POTASSIUM: 4.2 mmol/L (ref 3.5–5.1)
SODIUM: 140 mmol/L (ref 135–145)
Total Bilirubin: 0.5 mg/dL (ref 0.3–1.2)
Total Protein: 5.5 g/dL — ABNORMAL LOW (ref 6.5–8.1)

## 2016-02-14 LAB — CBC WITH DIFFERENTIAL/PLATELET
Basophils Absolute: 0 10*3/uL (ref 0.0–0.1)
Basophils Relative: 0 %
EOS ABS: 0.6 10*3/uL (ref 0.0–0.7)
Eosinophils Relative: 5 %
HEMATOCRIT: 34.9 % — AB (ref 39.0–52.0)
HEMOGLOBIN: 11.2 g/dL — AB (ref 13.0–17.0)
LYMPHS ABS: 1.5 10*3/uL (ref 0.7–4.0)
Lymphocytes Relative: 13 %
MCH: 28.9 pg (ref 26.0–34.0)
MCHC: 32.1 g/dL (ref 30.0–36.0)
MCV: 89.9 fL (ref 78.0–100.0)
Monocytes Absolute: 1.4 10*3/uL — ABNORMAL HIGH (ref 0.1–1.0)
Monocytes Relative: 11 %
NEUTROS ABS: 8.4 10*3/uL — AB (ref 1.7–7.7)
NEUTROS PCT: 71 %
Platelets: 226 10*3/uL (ref 150–400)
RBC: 3.88 MIL/uL — AB (ref 4.22–5.81)
RDW: 13.3 % (ref 11.5–15.5)
WBC: 11.9 10*3/uL — AB (ref 4.0–10.5)

## 2016-02-14 LAB — URINE CULTURE: CULTURE: NO GROWTH

## 2016-02-14 LAB — GLUCOSE, CAPILLARY
GLUCOSE-CAPILLARY: 122 mg/dL — AB (ref 65–99)
GLUCOSE-CAPILLARY: 123 mg/dL — AB (ref 65–99)
GLUCOSE-CAPILLARY: 152 mg/dL — AB (ref 65–99)
Glucose-Capillary: 142 mg/dL — ABNORMAL HIGH (ref 65–99)

## 2016-02-14 MED ORDER — PHENAZOPYRIDINE HCL 100 MG PO TABS
100.0000 mg | ORAL_TABLET | Freq: Three times a day (TID) | ORAL | Status: DC
  Administered 2016-02-14 – 2016-02-15 (×4): 100 mg via ORAL
  Filled 2016-02-14 (×4): qty 1

## 2016-02-14 MED ORDER — TAMSULOSIN HCL 0.4 MG PO CAPS
0.4000 mg | ORAL_CAPSULE | Freq: Every day | ORAL | Status: DC
  Administered 2016-02-14: 0.4 mg via ORAL
  Filled 2016-02-14: qty 1

## 2016-02-14 NOTE — Progress Notes (Signed)
PROGRESS NOTE    [name]  [id number] DOB: 01/11/1932 DOA: 02/11/2016 PCP: Stephens Shire, MD    Brief Narrative:  82 year old gentleman history of diabetes well controlled, hypertension, obstructive sleep apnea on C Pap presented to the ED with a four-day history of urinary retention, dysuria and left flank tenderness. Urinalysis worrisome for UTI. Patient also noted to have pneumobilia on CT chest and on abdominal ultrasound. General surgery consulted and at this time no surgical intervention needed. Patient has been pancultured and patient on empiric IV Zosyn being treated for an acute pyelonephritis/UTI.   Assessment & Plan:   Principal Problem:   Acute pyelonephritis: Probable Active Problems:   Essential hypertension, benign   OSA on CPAP   UTI (lower urinary tract infection)   CKD stage 3 due to type 2 diabetes mellitus ([place])   Pneumobilia   #1 SIRS due to pyelonephritis/UTI Patient presented with complaints of dysuria, left flank tenderness and noted to have a significant leukocytosis with a white count of 24.7. Some chills and subjective fever at home as per wife reports  Urinalysis consistent with a UTI.  Urine Culture pending  Blood cultures without growth. Continue empiric IV Zosyn, IV fluids and will start pyridium  Continue Supportive care.  #2 pneumobilia Per CT chest and also noted on abdominal ultrasound.  Patient is currently afebrile and responding to IV antibiotics WBC's essentially WNL and with identified source.  Patient denies right upper quadrant pain.  Patient was seen in consultation by general surgery who felt at this time patient does not have any evidence of cholecystitis and no surgical intervention was indicated.   #3 hypertension Stable and well controlled w/o meds currently Will continue low sodium diet Continue holding BP meds for now  #4 hyperkalemia repleted and WNL Will monitor  #5 Acute on chronic kidney disease stage  III Due to dehydration and UTI most likely Improved/resolved and back to baseline now Will monitor   #6 type 2 diabetes Hemoglobin A1c was 6.8 in 2014. Will follow hemoglobin A1c. Continue Lantus and Sliding scale insulin. Lantus dose adjusted for better control (still less than home dose)  #7 obstructive sleep apnea Continue CPAP daily at bedtime.  DVT prophylaxis: Lovenox Code Status: Full Family Communication: Updated patient and wife at bedside. Disposition Plan: Home when medically stable.   Consultants:   General surgery: Dr. Rosendo Gros 02/13/2016  Procedures:   Renal ultrasound 02/12/2016  Abdominal ultrasound 02/13/2016  Chest x-ray 02/11/2016  CT angiogram chest 02/11/2016  Antimicrobials:   IV Zosyn 02/12/2016  IV Rocephin 02/11/2016>>>> 02/12/2016   Subjective: Patient denies CP, nausea, vomiting and shortness of breath. Reports significant dysuria and still with left flank pain.  Objective: Filed Vitals:   02/13/16 0424 02/13/16 1326 02/13/16 2150 02/14/16 0516  BP: 117/62 120/55 140/84 124/56  Pulse: 89 90 93 85  Temp:  98.4 F (36.9 C) 98.4 F (36.9 C) 98.3 F (36.8 C)  TempSrc:  Oral Oral Oral  Resp: 20 20 20 20   Height:      Weight:      SpO2: 99% 94% 95% 100%    Intake/Output Summary (Last 24 hours) at 02/14/16 1337 Last data filed at 02/14/16 1116  Gross per 24 hour  Intake   1350 ml  Output   2450 ml  Net  -1100 ml   Filed Weights   02/12/16 0112  Weight: 134.3 kg (296 lb 1.2 oz)    Examination:  General exam: Appears calm and comfortable; afebrile, no  CP or complaints of SOB. Reports significant dysuria Respiratory system: Clear to auscultation. Respiratory effort normal. Cardiovascular system: S1 & S2 heard, RRR. No JVD, murmurs, rubs, gallops or clicks. No pedal edema. Gastrointestinal system: Abdomen is obese, nondistended, soft and w/o guarding. No organomegaly or masses felt. Normal bowel sounds heard. Still with left  CVA tenderness Central nervous system: Alert and oriented. No focal neurological deficits. Skin: No rashes, lesions or ulcers Psychiatry: Judgement and insight appear normal. Mood & affect appropriate.     Data Reviewed: I have personally reviewed following labs and imaging studies  CBC:  Recent Labs Lab 02/11/16 2013 02/12/16 0532 02/13/16 0550 02/14/16 0359  WBC 25.4* 24.7* 20.6* 11.9*  NEUTROABS  --   --  16.0* 8.4*  HGB 12.5* 11.6* 11.2* 11.2*  HCT 38.9* 36.8* 35.0* 34.9*  MCV 89.8 90.4 91.4 89.9  PLT 229 193 PLATELET CLUMPS NOTED ON SMEAR, COUNT APPEARS ADEQUATE [id number]   Basic Metabolic Panel:  Recent Labs Lab 02/11/16 2013 02/12/16 0532 02/13/16 0550 02/13/16 0908 02/14/16 0359  NA 135 137 137  --  140  K 4.4 4.0 5.3* 3.8 4.2  CL 102 106 107  --  107  CO2 23 24 21*  --  26  GLUCOSE 214* 228* 123*  --  138*  BUN 31* 30* 36*  --  23*  CREATININE 1.63* 1.54* 1.29*  --  1.08  CALCIUM 8.9 8.3* 8.2*  --  8.3*   GFR: Estimated Creatinine Clearance: 75 mL/min (by C-G formula based on Cr of 1.08).   Liver Function Tests:  Recent Labs Lab 02/11/16 2013 02/14/16 0359  AST 27 33  ALT 18 29  ALKPHOS 87 110  BILITOT 1.2 0.5  PROT 6.4* 5.5*  ALBUMIN 3.1* 2.3*   CBG:  Recent Labs Lab 02/13/16 1152 02/13/16 1633 02/13/16 2147 02/14/16 0805 02/14/16 1241  GLUCAP 128* 129* 179* 122* 123*   Sepsis Labs:  Recent Labs Lab 02/11/16 2013 02/11/16 2245  LATICACIDVEN 1.6 2.0    Recent Results (from the past 240 hour(s))  Blood Culture (routine x 2)     Status: None (Preliminary result)   Collection Time: 02/11/16  7:58 PM  Result Value Ref Range Status   Specimen Description BLOOD RIGHT ANTECUBITAL  Final   Special Requests BOTTLES DRAWN AEROBIC AND ANAEROBIC 5CC  Final   Culture NO GROWTH 2 DAYS  Final   Report Status PENDING  Incomplete  Blood Culture (routine x 2)     Status: None (Preliminary result)   Collection Time: 02/11/16  8:13 PM  Result  Value Ref Range Status   Specimen Description BLOOD RIGHT HAND  Final   Special Requests BOTTLES DRAWN AEROBIC AND ANAEROBIC 5CC  Final   Culture NO GROWTH 2 DAYS  Final   Report Status PENDING  Incomplete  Urine culture     Status: None   Collection Time: 02/13/16  9:37 AM  Result Value Ref Range Status   Specimen Description URINE, RANDOM  Final   Special Requests NONE  Final   Culture NO GROWTH  Final   Report Status 02/14/2016 FINAL  Final         Radiology Studies: US Renal  02/12/2016  CLINICAL DATA:  urinary retention, diabetes EXAM: RENAL / URINARY TRACT ULTRASOUND COMPLETE COMPARISON:  None. FINDINGS: Right Kidney: Length: 12.9 cm. There is cortical thinning probable due to atrophy Echogenicity within normal limits. No mass or hydronephrosis visualized. Left Kidney: Length: 12.9 cm. Cortical thinning probable due to  atrophy. Echogenicity within normal limits. No hydronephrosis. There is a cyst in lower pole measures 1.3 x 1.4 cm. Bladder: Urinary bladder is decompressed by Foley catheter. IMPRESSION: 1. No hydronephrosis. Bilateral renal cortical thinning probable due to atrophy. Cyst in lower pole of the left kidney measures 1.3 x 1.4 cm. Foley catheter within decompressed urinary bladder. Electronically Signed   By: Lahoma Crocker M.D.   On: 02/12/2016 16:44   US Abdomen Limited  02/13/2016  CLINICAL DATA:  Pneumobilia noted on chest CTA. EXAM: US ABDOMEN LIMITED - RIGHT UPPER QUADRANT COMPARISON:  Chest CT 02/11/2016. FINDINGS: Gallbladder: Physiologically distended. Sludge and stones within the gallbladder, largest stone 12 mm. No gallbladder wall thickening, wall thickness 2.4 mm. Mobile echogenicity noted on sending clips likely air in the gallbladder lumen. No sonographic Murphy sign noted by sonographer. Common bile duct: Diameter: 5 mm. Liver: Pneumobilia on prior CT is not well seen sonographically. No focal lesion identified. Borderline increased in parenchymal echogenicity.  Normal directional flow in the main portal vein. IMPRESSION: 1. Pneumobilia suspected in the gallbladder. There is gallbladder sludge and stones, no wall thickening or findings of acute cholecystitis. 2. Pneumobilia in liver on CT not well visualized sonographically. Electronically Signed   By: Jeb Levering M.D.   On: 02/13/2016 01:33        Scheduled Meds: . cholecalciferol  2,000 Units Oral QHS  . docusate sodium  100 mg Oral BID  . enoxaparin (LOVENOX) injection  40 mg Subcutaneous Q24H  . galantamine  16 mg Oral Q breakfast  . insulin aspart  0-15 Units Subcutaneous TID WC  . insulin glargine  15 Units Subcutaneous QHS  . magnesium oxide  400 mg Oral BID  . pantoprazole  40 mg Oral BID  . phenazopyridine  100 mg Oral TID WC  . piperacillin-tazobactam (ZOSYN)  IV  3.375 g Intravenous Q8H  . polyethylene glycol  17 g Oral Daily  . polyvinyl alcohol  1 drop Both Eyes BID  . QUEtiapine  50 mg Oral QHS  . simvastatin  40 mg Oral QPM  . tamsulosin  0.4 mg Oral QPC supper  . tobramycin-dexamethasone  1 drop Both Eyes Q4H while awake   Continuous Infusions: . sodium chloride 1,000 mL (02/14/16 0204)     LOS: 3 days    Time spent: 30 mins    Barton Dubois, MD [facility] Pager [phone]  If 7PM-7AM, please contact night-coverage [email or web address] Password [email or web address] 02/14/2016, 1:37 PM

## 2016-02-15 DIAGNOSIS — E119 Type 2 diabetes mellitus without complications: Secondary | ICD-10-CM | POA: Insufficient documentation

## 2016-02-15 DIAGNOSIS — E1121 Type 2 diabetes mellitus with diabetic nephropathy: Secondary | ICD-10-CM

## 2016-02-15 DIAGNOSIS — R339 Retention of urine, unspecified: Secondary | ICD-10-CM | POA: Insufficient documentation

## 2016-02-15 LAB — CBC
HEMATOCRIT: 36.5 % — AB (ref 39.0–52.0)
HEMOGLOBIN: 11.1 g/dL — AB (ref 13.0–17.0)
MCH: 27.8 pg (ref 26.0–34.0)
MCHC: 30.4 g/dL (ref 30.0–36.0)
MCV: 91.5 fL (ref 78.0–100.0)
Platelets: 250 10*3/uL (ref 150–400)
RBC: 3.99 MIL/uL — AB (ref 4.22–5.81)
RDW: 13.1 % (ref 11.5–15.5)
WBC: 9.9 10*3/uL (ref 4.0–10.5)

## 2016-02-15 LAB — BASIC METABOLIC PANEL
Anion gap: 3 — ABNORMAL LOW (ref 5–15)
BUN: 17 mg/dL (ref 6–20)
CO2: 29 mmol/L (ref 22–32)
Calcium: 8.3 mg/dL — ABNORMAL LOW (ref 8.9–10.3)
Chloride: 109 mmol/L (ref 101–111)
Creatinine, Ser: 1.13 mg/dL (ref 0.61–1.24)
GFR calc Af Amer: >60 mL/min (ref >60)
GFR, EST NON AFRICAN AMERICAN: 59 mL/min — AB (ref >60)
GLUCOSE: 100 mg/dL — AB (ref 65–99)
POTASSIUM: 4.9 mmol/L (ref 3.5–5.1)
Sodium: 141 mmol/L (ref 135–145)

## 2016-02-15 LAB — HEMOGLOBIN A1C
HEMOGLOBIN A1C: 8 % — AB (ref 4.8–5.6)
MEAN PLASMA GLUCOSE: 183 mg/dL

## 2016-02-15 LAB — GLUCOSE, CAPILLARY
GLUCOSE-CAPILLARY: 85 mg/dL (ref 65–99)
GLUCOSE-CAPILLARY: 165 mg/dL — AB (ref 65–99)

## 2016-02-15 MED ORDER — AMOXICILLIN-POT CLAVULANATE 875-125 MG PO TABS: 1.0000 | ORAL_TABLET | Freq: Two times a day (BID) | ORAL | Status: DC

## 2016-02-15 MED ORDER — AMOXICILLIN-POT CLAVULANATE 875-125 MG PO TABS: 1.0000 | ORAL_TABLET | Freq: Two times a day (BID) | ORAL | Status: AC

## 2016-02-15 MED ORDER — PHENAZOPYRIDINE HCL 100 MG PO TABS: 100.0000 mg | ORAL_TABLET | Freq: Three times a day (TID) | ORAL | Status: DC

## 2016-02-15 MED ORDER — TAMSULOSIN HCL 0.4 MG PO CAPS: 0.4000 mg | ORAL_CAPSULE | Freq: Every day | ORAL | Status: DC

## 2016-02-15 NOTE — Clinical Documentation Improvement (Signed)
Hospitalist  Can the diagnosis of Sepsis be further specified? Please document in the medical record if Sepsis was present on admission.   Sepsis - specify causative organism if known  Determine if there is Severe Sepsis (Sepsis with organ dysfunction - specify), Septic Shock if present  Identify etiology of Sepsis - Device, Implant, Graft, Infusion, Abortion  Specify organ dysfunction - Respiratory Failure, Encephalopathy, Acute Kidney Failure, Pneumonia, UTI, Other (specify), Unable to Clinically Determine}  Other  Clinically Undetermined  Document any associated diagnoses/conditions.   Supporting Information: Sepsis secondary to UTI per 6/16 ED provider notes. 6/15: Lactic acid: 1.6.   Please exercise your independent, professional judgment when responding. A specific answer is not anticipated or expected.   Thank You,  [place] [phone]

## 2016-02-15 NOTE — Discharge Summary (Signed)
Physician Discharge Summary  [name] [id number] DOB: Jan 21, 1932 DOA: 02/11/2016  PCP: Stephens Shire, MD  Admit date: 02/11/2016 Discharge date: 02/15/2016  Time spent: 35 minutes  Recommendations for Outpatient Follow-up:  Reassess BMET to follow electrolytes and renal function Please reassess BP and adjust antihypertensive regimen as needed Close follow up to his CBG's and further adjustment to hypoglycemic regimen as required   Discharge Diagnoses:  SIRS Acute pyelonephritis: Presumed Potential prostatitis  Essential hypertension, benign OSA on CPAP Acute on chronic CKD stage 3 due to type 2 diabetes mellitus ([place]) Pneumobilia Type 2 diabetes with nephropathy  Obesity   Discharge Condition: stable and improved. Discharge home with instructions to follow up with PCP in 10 days.   Diet recommendation: heart healthy and modified carb diet  Filed Weights   02/12/16 0112  Weight: 134.3 kg (296 lb 1.2 oz)    History of present illness:  82 y.o. male with medical history significant of DM, HTN, OSA, patient presents to the ED with c/o 4 day history of "urinary retention". He has 5 falls over the last 2 days, no LOC or head injury. Uses walker at home. Patient has increased SOB at rest worse with exertion. Worsening bilateral lower extremity "edema" and abdominal distention. He reports 20 lb weight gain over past 3 months. No chest pain.  His "urinary retention" is associated with feeling of needing to go to bathroom but being unable to go or "leaking" only a small amount of urine throughout the day.  Hospital Course:  #1 SIRS due to pyelonephritis/UTI Patient presented with complaints of dysuria, left flank tenderness and noted to have a significant leukocytosis with a white count of 24.7. Some chills and subjective fever at home as per wife reports.  Urinalysis consistent with a UTI and patient with dysuria. Other concerns given urinary retention was potential  prostatitis.   Urine Culture w/o growth, but taken after abx's given Blood cultures without growth. Will discharge on augmentin and treat for 10 days; also discharge on pyridium  Advise to keep himself well hydrated   #2 pneumobilia Per CT chest and also noted on abdominal ultrasound.  Patient is currently afebrile and responding to IV antibiotics WBC's essentially WNL and with identified source.  Patient denies right upper quadrant pain.  Patient was seen in consultation by general surgery who felt at this time patient does not have any evidence of cholecystitis and no surgical intervention was indicated.   #3 hypertension Stable  Advise to follow low sodium diet Resume home antihypertensive regimen at discharge  #4 hypokalemia repleted and WNL Will recommend BMET at follow up to assess electrolytes trend  #5 Acute on chronic kidney disease stage III Due to dehydration, urinary retention and UTI most likely Improved/resolved and back to baseline now Able to void after foley removed Discharge on flomax and instructed to arrange follow up with urology service for formal prostate evaluation and to determine if he will need long term therapy with flomax.  #6 type 2 diabetes Hemoglobin A1c was 8.0 Continue Lantus and Sliding scale insulin. Advise to follow low sugar diet and to follow up with PCP for further adjustment as needed on his hypoglycemic regimen.  #7 Obstructive sleep apnea Continue CPAP daily at bedtime.  #8 obesity Body mass index is 41.31 kg/(m^2). Advise to follow low calorie diet and to increase exercise   Procedures:  Renal ultrasound 02/12/2016  Abdominal ultrasound 02/13/2016  Chest x-ray 02/11/2016  CT angiogram chest 02/11/2016  Consultations:  General surgery: Dr. Rosendo Gros 02/13/2016  Discharge Exam: Filed Vitals:   02/15/16 0519 02/15/16 1349  BP: 132/56 129/58  Pulse: 75 87  Temp: 98.3 F (36.8 C) 97.9 F (36.6 C)  Resp: 20 18    General exam: Appears calm and comfortable; afebrile, no CP or complaints of SOB. Reports significant dysuria Respiratory system: Clear to auscultation. Respiratory effort normal. Cardiovascular system: S1 & S2 heard, RRR. No JVD, murmurs, rubs, gallops or clicks. No pedal edema. Gastrointestinal system: Abdomen is obese, nondistended, soft and w/o guarding. No organomegaly or masses felt. Normal bowel sounds heard. Still with left CVA tenderness Central nervous system: Alert and oriented. No focal neurological deficits. Skin: No rashes, lesions or ulcers Psychiatry: Judgement and insight appear normal. Mood & affect appropriate.    Discharge Instructions   Discharge Instructions    Diet - low sodium heart healthy    Complete by:  As directed      Discharge instructions    Complete by:  As directed   Keep yourself well hydrated Take medications as prescribed Follow up with PCP in 10 days Follow heart healthy diet          Current Discharge Medication List    START taking these medications   Details  amoxicillin-clavulanate (AUGMENTIN) 875-125 MG tablet Take 1 tablet by mouth 2 (two) times daily. Qty: 14 tablet, Refills: 0    phenazopyridine (PYRIDIUM) 100 MG tablet Take 1 tablet (100 mg total) by mouth 3 (three) times daily with meals. Qty: 21 tablet, Refills: 0    tamsulosin (FLOMAX) 0.4 MG CAPS capsule Take 1 capsule (0.4 mg total) by mouth daily after supper. Qty: 30 capsule, Refills: 0      CONTINUE these medications which have NOT CHANGED   Details  carvedilol (COREG) 3.125 MG tablet Take 3.125 mg by mouth 2 (two) times daily with a meal.    dextran 70-hypromellose (TEARS RENEWED) ophthalmic solution Place 1 drop into both eyes 2 (two) times daily.    docusate sodium (COLACE) 100 MG capsule Take 1 capsule (100 mg total) by mouth 2 (two) times daily. While taking narcotic pain medicine. Qty: 30 capsule, Refills: 0    Ergocalciferol (VITAMIN D2) 2000 UNITS TABS  Take 1 tablet by mouth at bedtime.     galantamine (RAZADYNE ER) 16 MG 24 hr capsule Take 16 mg by mouth daily with breakfast.    insulin aspart (NOVOLOG) 100 UNIT/ML injection Inject 0-15 Units into the skin 3 (three) times daily with meals. Per sensitive sliding scale Qty: 1 vial, Refills: 0    insulin glargine (LANTUS) 100 UNIT/ML injection Inject 42 Units into the skin at bedtime.    isosorbide mononitrate (IMDUR) 30 MG 24 hr tablet Take 30 mg by mouth daily.    lisinopril (PRINIVIL,ZESTRIL) 20 MG tablet Take 20 mg by mouth daily.     magnesium oxide (MAG-OX) 400 MG tablet Take 400 mg by mouth 2 (two) times daily.    methocarbamol (ROBAXIN) 750 MG tablet Take 1 tablet by mouth every 6 (six) hours as needed for muscle spasms.     Multiple Vitamins-Minerals (PRESERVISION AREDS 2 PO) Take 1 capsule by mouth 2 (two) times daily.    pantoprazole (PROTONIX) 40 MG tablet Take 1 tablet (40 mg total) by mouth daily. Qty: 90 tablet, Refills: 1    QUEtiapine (SEROQUEL) 25 MG tablet Use 2 tab at night for sleep induction. Qty: 180 tablet, Refills: 1    simvastatin (ZOCOR) 40 MG tablet Take 40  mg by mouth every evening.    tobramycin-dexamethasone (TOBRADEX) ophthalmic solution Apply to eye.    vitamin B-12 (CYANOCOBALAMIN) 1000 MCG tablet Take 500 mcg by mouth 2 (two) times daily.        No Known Allergies Follow-up Information    Follow up with BURNETT,BRENT A, MD. Schedule an appointment as soon as possible for a visit in 10 days.   Specialty:  Family Medicine   Contact information:   [address] [place] [address] [phone]       Follow up with [facility].   Why:  call office for appontment in the next 2 weeks   Contact information:   [facility] [phone]      The results of significant diagnostics from this hospitalization (including imaging, microbiology, ancillary and laboratory)  are listed below for reference.    Significant Diagnostic Studies: Dg Chest 2 View  02/11/2016  CLINICAL DATA:  Several recent falls.  Shortness of breath EXAM: CHEST  2 VIEW COMPARISON:  November 22, 2012 FINDINGS: There is no edema or consolidation. Heart is upper normal in size with pulmonary vascularity within normal limits. No adenopathy. No bone lesions. IMPRESSION: No edema or consolidation. Electronically Signed   By: Lowella Grip III M.D.   On: 02/11/2016 20:28   Ct Angio Chest Pe W Or Wo Contrast  02/11/2016  CLINICAL DATA:  Shortness of breath at rest but worse with exertion. History of diabetes and pneumonia. EXAM: CT ANGIOGRAPHY CHEST WITH CONTRAST TECHNIQUE: Multidetector CT imaging of the chest was performed using the standard protocol during bolus administration of intravenous contrast. Multiplanar CT image reconstructions and MIPs were obtained to evaluate the vascular anatomy. CONTRAST:  100 mL Isovue 370 COMPARISON:  None. FINDINGS: Technically adequate study with moderately good opacification of the central and proximal segmental pulmonary arteries. No filling defects demonstrated in the visualize central vessels suggesting no significant central pulmonary embolus. Peripheral vessels are not well evaluated due to technique. Normal heart size. Normal caliber thoracic aorta. Calcification in the aorta and coronary arteries. No aortic dissection. Great vessel origins are patent. No significant lymphadenopathy in the chest. Esophagus is decompressed. Evaluation of lungs is limited due to respiratory motion. Atelectasis in the lung bases. No gross consolidation. No pleural effusions. No pneumothorax. Airways appear patent. Included portions of the upper abdominal organs demonstrate pneumobilia. This is likely postoperative. Correlate for history of sphincterotomy. If there is no history of surgery, infection or fistula could also cause this finding. The visualized gallbladder is normal except  for pneumobilia. Fatty infiltration of the pancreas. Degenerative changes in the spine. No destructive bone lesions. Review of the MIP images confirms the above findings. IMPRESSION: No evidence of central pulmonary embolus. No evidence of active pulmonary disease. Nonspecific pneumobilia, likely postoperative. Electronically Signed   By: Lucienne Capers M.D.   On: 02/11/2016 23:17   US Renal  02/12/2016  CLINICAL DATA:  urinary retention, diabetes EXAM: RENAL / URINARY TRACT ULTRASOUND COMPLETE COMPARISON:  None. FINDINGS: Right Kidney: Length: 12.9 cm. There is cortical thinning probable due to atrophy Echogenicity within normal limits. No mass or hydronephrosis visualized. Left Kidney: Length: 12.9 cm. Cortical thinning probable due to atrophy. Echogenicity within normal limits. No hydronephrosis. There is a cyst in lower pole measures 1.3 x 1.4 cm. Bladder: Urinary bladder is decompressed by Foley catheter. IMPRESSION: 1. No hydronephrosis. Bilateral renal cortical thinning probable due to atrophy. Cyst in  lower pole of the left kidney measures 1.3 x 1.4 cm. Foley catheter within decompressed urinary bladder. Electronically Signed   By: Lahoma Crocker M.D.   On: 02/12/2016 16:44   US Abdomen Limited  02/13/2016  CLINICAL DATA:  Pneumobilia noted on chest CTA. EXAM: US ABDOMEN LIMITED - RIGHT UPPER QUADRANT COMPARISON:  Chest CT 02/11/2016. FINDINGS: Gallbladder: Physiologically distended. Sludge and stones within the gallbladder, largest stone 12 mm. No gallbladder wall thickening, wall thickness 2.4 mm. Mobile echogenicity noted on sending clips likely air in the gallbladder lumen. No sonographic Murphy sign noted by sonographer. Common bile duct: Diameter: 5 mm. Liver: Pneumobilia on prior CT is not well seen sonographically. No focal lesion identified. Borderline increased in parenchymal echogenicity. Normal directional flow in the main portal vein. IMPRESSION: 1. Pneumobilia suspected in the gallbladder.  There is gallbladder sludge and stones, no wall thickening or findings of acute cholecystitis. 2. Pneumobilia in liver on CT not well visualized sonographically. Electronically Signed   By: Jeb Levering M.D.   On: 02/13/2016 01:33    Microbiology: Recent Results (from the past 240 hour(s))  Blood Culture (routine x 2)     Status: None (Preliminary result)   Collection Time: 02/11/16  7:58 PM  Result Value Ref Range Status   Specimen Description BLOOD RIGHT ANTECUBITAL  Final   Special Requests BOTTLES DRAWN AEROBIC AND ANAEROBIC 5CC  Final   Culture NO GROWTH 3 DAYS  Final   Report Status PENDING  Incomplete  Blood Culture (routine x 2)     Status: None (Preliminary result)   Collection Time: 02/11/16  8:13 PM  Result Value Ref Range Status   Specimen Description BLOOD RIGHT HAND  Final   Special Requests BOTTLES DRAWN AEROBIC AND ANAEROBIC 5CC  Final   Culture NO GROWTH 3 DAYS  Final   Report Status PENDING  Incomplete  Urine culture     Status: None   Collection Time: 02/13/16  9:37 AM  Result Value Ref Range Status   Specimen Description URINE, RANDOM  Final   Special Requests NONE  Final   Culture NO GROWTH  Final   Report Status 02/14/2016 FINAL  Final     Labs: Basic Metabolic Panel:  Recent Labs Lab 02/11/16 2013 02/12/16 0532 02/13/16 0550 02/13/16 0908 02/14/16 0359 02/15/16 0642  NA 135 137 137  --  140 141  K 4.4 4.0 5.3* 3.8 4.2 4.9  CL 102 106 107  --  107 109  CO2 23 24 21*  --  26 29  GLUCOSE 214* 228* 123*  --  138* 100*  BUN 31* 30* 36*  --  23* 17  CREATININE 1.63* 1.54* 1.29*  --  1.08 1.13  CALCIUM 8.9 8.3* 8.2*  --  8.3* 8.3*   Liver Function Tests:  Recent Labs Lab 02/11/16 2013 02/14/16 0359  AST 27 33  ALT 18 29  ALKPHOS 87 110  BILITOT 1.2 0.5  PROT 6.4* 5.5*  ALBUMIN 3.1* 2.3*   CBC:  Recent Labs Lab 02/11/16 2013 02/12/16 0532 02/13/16 0550 02/14/16 0359 02/15/16 0642  WBC 25.4* 24.7* 20.6* 11.9* 9.9  NEUTROABS  --    --  16.0* 8.4*  --   HGB 12.5* 11.6* 11.2* 11.2* 11.1*  HCT 38.9* 36.8* 35.0* 34.9* 36.5*  MCV 89.8 90.4 91.4 89.9 91.5  PLT 229 193 PLATELET CLUMPS NOTED ON SMEAR, COUNT APPEARS ADEQUATE 226 250   BNP (last 3 results)  Recent Labs  02/11/16 2033  BNP  131.7*   CBG:  Recent Labs Lab 02/14/16 1241 02/14/16 1728 02/14/16 2211 02/15/16 0741 02/15/16 1203  GLUCAP 123* 152* 142* 85 165*    Signed:  Barton Dubois MD.  [facility] 02/15/2016, 3:14 PM

## 2016-02-15 NOTE — Care Management Important Message (Signed)
Important Message  Patient Details  Name: [name] MRN: [id number] Date of Birth: August 23, 1932   Medicare Important Message Given:  Yes    Nathen May 02/15/2016, 11:39 AM

## 2016-02-15 NOTE — Progress Notes (Signed)
Nsg Discharge Note  Admit Date:  02/11/2016 Discharge date: 02/15/2016   Doreene Eland to be D/C'd Home per MD order.  AVS completed.  Copy for chart, and copy for patient signed, and dated. Patient/caregiver able to verbalize understanding.  Discharge Medication:   Medication List    TAKE these medications        amoxicillin-clavulanate 875-125 MG tablet  Commonly known as:  AUGMENTIN  Take 1 tablet by mouth 2 (two) times daily.     carvedilol 3.125 MG tablet  Commonly known as:  COREG  Take 3.125 mg by mouth 2 (two) times daily with a meal.     dextran 70-hypromellose ophthalmic solution  Place 1 drop into both eyes 2 (two) times daily.     docusate sodium 100 MG capsule  Commonly known as:  COLACE  Take 1 capsule (100 mg total) by mouth 2 (two) times daily. While taking narcotic pain medicine.     galantamine 16 MG 24 hr capsule  Commonly known as:  RAZADYNE ER  Take 16 mg by mouth daily with breakfast.     insulin aspart 100 UNIT/ML injection  Commonly known as:  novoLOG  Inject 0-15 Units into the skin 3 (three) times daily with meals. Per sensitive sliding scale     insulin glargine 100 UNIT/ML injection  Commonly known as:  LANTUS  Inject 42 Units into the skin at bedtime.     isosorbide mononitrate 30 MG 24 hr tablet  Commonly known as:  IMDUR  Take 30 mg by mouth daily.     lisinopril 20 MG tablet  Commonly known as:  PRINIVIL,ZESTRIL  Take 20 mg by mouth daily.     magnesium oxide 400 MG tablet  Commonly known as:  MAG-OX  Take 400 mg by mouth 2 (two) times daily.     methocarbamol 750 MG tablet  Commonly known as:  ROBAXIN  Take 1 tablet by mouth every 6 (six) hours as needed for muscle spasms.     pantoprazole 40 MG tablet  Commonly known as:  PROTONIX  Take 1 tablet (40 mg total) by mouth daily.     phenazopyridine 100 MG tablet  Commonly known as:  PYRIDIUM  Take 1 tablet (100 mg total) by mouth 3 (three) times daily with meals.     PRESERVISION AREDS 2 PO  Take 1 capsule by mouth 2 (two) times daily.     QUEtiapine 25 MG tablet  Commonly known as:  SEROQUEL  Use 2 tab at night for sleep induction.     simvastatin 40 MG tablet  Commonly known as:  ZOCOR  Take 40 mg by mouth every evening.     tamsulosin 0.4 MG Caps capsule  Commonly known as:  FLOMAX  Take 1 capsule (0.4 mg total) by mouth daily after supper.     tobramycin-dexamethasone ophthalmic solution  Commonly known as:  TOBRADEX  Apply to eye.     vitamin B-12 1000 MCG tablet  Commonly known as:  CYANOCOBALAMIN  Take 500 mcg by mouth 2 (two) times daily.     Vitamin D2 2000 units Tabs  Take 1 tablet by mouth at bedtime.        Discharge Assessment: Filed Vitals:   02/15/16 0519 02/15/16 1349  BP: 132/56 129/58  Pulse: 75 87  Temp: 98.3 F (36.8 C) 97.9 F (36.6 C)  Resp: 20 18   Skin clean, dry and intact without evidence of skin break down, no evidence of skin  tears noted. IV catheter discontinued intact. Site without signs and symptoms of complications - no redness or edema noted at insertion site, patient denies c/o pain - only slight tenderness at site.  Dressing with slight pressure applied.  D/c Instructions-Education: Discharge instructions given to patient/family with verbalized understanding. D/c education completed with patient/family including follow up instructions, medication list, d/c activities limitations if indicated, with other d/c instructions as indicated by MD - patient able to verbalize understanding, all questions fully answered. Patient instructed to return to ED, call 911, or call MD for any changes in condition.  Patient escorted via [place], and D/C home via private auto.  Dayle Points, RN 02/15/2016 3:27 PM

## 2016-02-15 NOTE — Progress Notes (Addendum)
PT Cancellation Note  Patient Details Name: [name] MRN: [id number] DOB: Sep 10, 1931   Cancelled Treatment:    Reason Eval/Treat Not Completed: Patient declined, no reason specified Pt declined due to pain in L knee and R foot. Pt stated "i'm already doing it" referring to therapy. Pt given max encouragement to participate and educated on importance of maintaining mobility during hospital stay. Pt up in recliner with assist from NT this am.   Salina April, PTA Pager: [phone]   02/15/2016, 9:26 AM

## 2016-02-15 NOTE — Progress Notes (Signed)
Pt. Refuses  Times two to do a walking 02 sat. Pt. Saturations on room air at rest are 94%-97%. Pt. Has been educated.

## 2016-02-16 LAB — CULTURE, BLOOD (ROUTINE X 2)
CULTURE: NO GROWTH
CULTURE: NO GROWTH

## 2016-02-16 MED ORDER — IOPAMIDOL (ISOVUE-370) INJECTION 76%
100.0000 mL | Freq: Once | INTRAVENOUS | Status: AC | PRN
  Administered 2016-02-11: 100 mL via INTRAVENOUS

## 2016-02-22 DIAGNOSIS — E1149 Type 2 diabetes mellitus with other diabetic neurological complication: Secondary | ICD-10-CM | POA: Diagnosis not present

## 2016-02-22 DIAGNOSIS — I10 Essential (primary) hypertension: Secondary | ICD-10-CM | POA: Diagnosis not present

## 2016-02-22 DIAGNOSIS — E1140 Type 2 diabetes mellitus with diabetic neuropathy, unspecified: Secondary | ICD-10-CM | POA: Diagnosis not present

## 2016-02-22 DIAGNOSIS — R339 Retention of urine, unspecified: Secondary | ICD-10-CM | POA: Diagnosis not present

## 2016-02-22 DIAGNOSIS — N10 Acute tubulo-interstitial nephritis: Secondary | ICD-10-CM | POA: Diagnosis not present

## 2016-02-23 LAB — BASIC METABOLIC PANEL
BUN: 27 mg/dL — AB (ref 4–21)
Creatinine: 1 mg/dL (ref <=1.3)
Glucose: 201 mg/dL
POTASSIUM: 5.5 mmol/L — AB (ref 3.4–5.3)
Sodium: 138 mmol/L (ref 137–147)

## 2016-03-04 DIAGNOSIS — N3941 Urge incontinence: Secondary | ICD-10-CM | POA: Diagnosis not present

## 2016-03-04 DIAGNOSIS — R3914 Feeling of incomplete bladder emptying: Secondary | ICD-10-CM | POA: Diagnosis not present

## 2016-03-29 ENCOUNTER — Ambulatory Visit: Payer: Self-pay | Admitting: Neurology

## 2016-03-30 ENCOUNTER — Encounter: Payer: Self-pay | Admitting: Neurology

## 2016-04-14 DIAGNOSIS — M25562 Pain in left knee: Secondary | ICD-10-CM | POA: Diagnosis not present

## 2016-04-14 DIAGNOSIS — G5601 Carpal tunnel syndrome, right upper limb: Secondary | ICD-10-CM | POA: Diagnosis not present

## 2016-04-14 DIAGNOSIS — G5602 Carpal tunnel syndrome, left upper limb: Secondary | ICD-10-CM | POA: Diagnosis not present

## 2016-04-18 ENCOUNTER — Other Ambulatory Visit: Payer: Self-pay | Admitting: Neurology

## 2016-04-18 DIAGNOSIS — R200 Anesthesia of skin: Secondary | ICD-10-CM | POA: Diagnosis not present

## 2016-04-20 DIAGNOSIS — G5601 Carpal tunnel syndrome, right upper limb: Secondary | ICD-10-CM | POA: Diagnosis not present

## 2016-04-20 DIAGNOSIS — G5602 Carpal tunnel syndrome, left upper limb: Secondary | ICD-10-CM | POA: Diagnosis not present

## 2016-04-26 ENCOUNTER — Encounter: Payer: Self-pay | Admitting: Neurology

## 2016-04-26 ENCOUNTER — Ambulatory Visit (INDEPENDENT_AMBULATORY_CARE_PROVIDER_SITE_OTHER): Payer: Medicare Other | Admitting: Neurology

## 2016-04-26 VITALS — BP 156/80 | HR 68 | Resp 20

## 2016-04-26 DIAGNOSIS — F4310 Post-traumatic stress disorder, unspecified: Secondary | ICD-10-CM

## 2016-04-26 DIAGNOSIS — G4733 Obstructive sleep apnea (adult) (pediatric): Secondary | ICD-10-CM

## 2016-04-26 DIAGNOSIS — Z9989 Dependence on other enabling machines and devices: Principal | ICD-10-CM

## 2016-04-26 MED ORDER — QUETIAPINE FUMARATE 25 MG PO TABS: 25.0000 mg | ORAL_TABLET | Freq: Every day | ORAL | 0 refills | Status: DC
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
## 2016-04-27 ENCOUNTER — Encounter: Payer: Self-pay | Admitting: General Practice

## 2016-05-04 DIAGNOSIS — G5601 Carpal tunnel syndrome, right upper limb: Secondary | ICD-10-CM | POA: Diagnosis not present

## 2016-05-04 DIAGNOSIS — M25562 Pain in left knee: Secondary | ICD-10-CM | POA: Diagnosis not present

## 2016-05-04 DIAGNOSIS — G5602 Carpal tunnel syndrome, left upper limb: Secondary | ICD-10-CM | POA: Diagnosis not present

## 2016-05-17 DIAGNOSIS — M1712 Unilateral primary osteoarthritis, left knee: Secondary | ICD-10-CM | POA: Diagnosis not present

## 2016-05-21 DIAGNOSIS — M25562 Pain in left knee: Secondary | ICD-10-CM | POA: Diagnosis not present

## 2016-05-23 ENCOUNTER — Encounter (HOSPITAL_COMMUNITY): Payer: Self-pay | Admitting: Emergency Medicine

## 2016-05-23 ENCOUNTER — Emergency Department (HOSPITAL_COMMUNITY): Payer: Medicare Other

## 2016-05-23 ENCOUNTER — Emergency Department (HOSPITAL_COMMUNITY)
Admission: EM | Admit: 2016-05-23 | Discharge: 2016-05-24 | Disposition: A | Discharge status: Home / Self Care | Payer: Medicare Other | Attending: Emergency Medicine | Admitting: Emergency Medicine

## 2016-05-23 DIAGNOSIS — N183 Chronic kidney disease, stage 3 (moderate): Secondary | ICD-10-CM | POA: Insufficient documentation

## 2016-05-23 DIAGNOSIS — E1165 Type 2 diabetes mellitus with hyperglycemia: Secondary | ICD-10-CM | POA: Diagnosis not present

## 2016-05-23 DIAGNOSIS — R404 Transient alteration of awareness: Secondary | ICD-10-CM | POA: Diagnosis not present

## 2016-05-23 DIAGNOSIS — R079 Chest pain, unspecified: Secondary | ICD-10-CM | POA: Diagnosis not present

## 2016-05-23 DIAGNOSIS — Z79899 Other long term (current) drug therapy: Secondary | ICD-10-CM | POA: Insufficient documentation

## 2016-05-23 DIAGNOSIS — R42 Dizziness and giddiness: Secondary | ICD-10-CM

## 2016-05-23 DIAGNOSIS — Z794 Long term (current) use of insulin: Secondary | ICD-10-CM | POA: Insufficient documentation

## 2016-05-23 DIAGNOSIS — E1121 Type 2 diabetes mellitus with diabetic nephropathy: Secondary | ICD-10-CM | POA: Diagnosis not present

## 2016-05-23 DIAGNOSIS — I129 Hypertensive chronic kidney disease with stage 1 through stage 4 chronic kidney disease, or unspecified chronic kidney disease: Secondary | ICD-10-CM | POA: Insufficient documentation

## 2016-05-23 DIAGNOSIS — E1122 Type 2 diabetes mellitus with diabetic chronic kidney disease: Secondary | ICD-10-CM | POA: Insufficient documentation

## 2016-05-23 LAB — CBC WITH DIFFERENTIAL/PLATELET
BASOS PCT: 0 %
Basophils Absolute: 0 10*3/uL (ref 0.0–0.1)
EOS PCT: 4 %
Eosinophils Absolute: 0.6 10*3/uL (ref 0.0–0.7)
HCT: 42.5 % (ref 39.0–52.0)
Hemoglobin: 13.5 g/dL (ref 13.0–17.0)
LYMPHS ABS: 2 10*3/uL (ref 0.7–4.0)
Lymphocytes Relative: 14 %
MCH: 28.7 pg (ref 26.0–34.0)
MCHC: 31.8 g/dL (ref 30.0–36.0)
MCV: 90.4 fL (ref 78.0–100.0)
MONO ABS: 1.2 10*3/uL — AB (ref 0.1–1.0)
Monocytes Relative: 8 %
Neutro Abs: 10.7 10*3/uL — ABNORMAL HIGH (ref 1.7–7.7)
Neutrophils Relative %: 74 %
PLATELETS: 259 10*3/uL (ref 150–400)
RBC: 4.7 MIL/uL (ref 4.22–5.81)
RDW: 14 % (ref 11.5–15.5)
WBC: 14.5 10*3/uL — ABNORMAL HIGH (ref 4.0–10.5)

## 2016-05-23 LAB — BASIC METABOLIC PANEL
Anion gap: 10 (ref 5–15)
BUN: 34 mg/dL — AB (ref 6–20)
CALCIUM: 9 mg/dL (ref 8.9–10.3)
CO2: 24 mmol/L (ref 22–32)
Chloride: 106 mmol/L (ref 101–111)
Creatinine, Ser: 1.4 mg/dL — ABNORMAL HIGH (ref 0.61–1.24)
GFR calc Af Amer: 52 mL/min — ABNORMAL LOW (ref >60)
GFR, EST NON AFRICAN AMERICAN: 45 mL/min — AB (ref >60)
GLUCOSE: 335 mg/dL — AB (ref 65–99)
POTASSIUM: 5.1 mmol/L (ref 3.5–5.1)
Sodium: 140 mmol/L (ref 135–145)

## 2016-05-23 LAB — URINALYSIS, ROUTINE W REFLEX MICROSCOPIC
Bilirubin Urine: NEGATIVE
GLUCOSE, UA: 500 mg/dL — AB
Hgb urine dipstick: NEGATIVE
KETONES UR: NEGATIVE mg/dL
LEUKOCYTES UA: NEGATIVE
Nitrite: NEGATIVE
PH: 5.5 (ref 5.0–8.0)
Protein, ur: NEGATIVE mg/dL
SPECIFIC GRAVITY, URINE: 1.029 (ref 1.005–1.030)

## 2016-05-23 LAB — I-STAT TROPONIN, ED: Troponin i, poc: 0 ng/mL (ref 0.00–0.08)

## 2016-05-23 LAB — MAGNESIUM: Magnesium: 2 mg/dL (ref 1.7–2.4)

## 2016-05-23 MED ORDER — SODIUM CHLORIDE 0.9 % IV BOLUS (SEPSIS)
1000.0000 mL | Freq: Once | INTRAVENOUS | Status: AC
  Administered 2016-05-23: 1000 mL via INTRAVENOUS

## 2016-05-23 NOTE — ED Triage Notes (Signed)
Patient arrived to Ed via [facility]. EMS reports:  Patient from home. Lives with spouse. Patient reports approx 1900 that he was lyingi n bed watching television, and he experienced dizziness with feeling that the room was spinning, along with generalized weakness. Episode lasted approx 30 min.  Reported chest pain which lasted -3 minutes last night, but none since that time. Lungs clear.  VSS. BP 122/80, pulse 100,resp 14, 93-94% on room air. CBG 279. Hx - Diabetes. Takes insulin (short & long). Has taken it after supper this evening.
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
## 2016-05-24 DIAGNOSIS — M1712 Unilateral primary osteoarthritis, left knee: Secondary | ICD-10-CM | POA: Diagnosis not present

## 2016-05-31 DIAGNOSIS — G5602 Carpal tunnel syndrome, left upper limb: Secondary | ICD-10-CM | POA: Diagnosis not present

## 2016-05-31 DIAGNOSIS — G5601 Carpal tunnel syndrome, right upper limb: Secondary | ICD-10-CM | POA: Diagnosis not present

## 2016-05-31 DIAGNOSIS — M1712 Unilateral primary osteoarthritis, left knee: Secondary | ICD-10-CM | POA: Diagnosis not present

## 2016-06-01 ENCOUNTER — Other Ambulatory Visit: Payer: Self-pay | Admitting: Orthopedic Surgery

## 2016-06-04 ENCOUNTER — Emergency Department (HOSPITAL_COMMUNITY): Payer: Medicare Other

## 2016-06-04 ENCOUNTER — Encounter (HOSPITAL_COMMUNITY): Payer: Self-pay | Admitting: Emergency Medicine

## 2016-06-04 ENCOUNTER — Inpatient Hospital Stay (HOSPITAL_COMMUNITY)
Admission: EM | Admit: 2016-06-04 | Discharge: 2016-06-08 | DRG: 563 | Disposition: A | Discharge status: Skilled Nursing Facility | Payer: Medicare Other | Attending: Family Medicine | Admitting: Family Medicine

## 2016-06-04 DIAGNOSIS — Z79899 Other long term (current) drug therapy: Secondary | ICD-10-CM

## 2016-06-04 DIAGNOSIS — S82841D Displaced bimalleolar fracture of right lower leg, subsequent encounter for closed fracture with routine healing: Secondary | ICD-10-CM | POA: Diagnosis not present

## 2016-06-04 DIAGNOSIS — F329 Major depressive disorder, single episode, unspecified: Secondary | ICD-10-CM | POA: Diagnosis present

## 2016-06-04 DIAGNOSIS — E1121 Type 2 diabetes mellitus with diabetic nephropathy: Secondary | ICD-10-CM | POA: Diagnosis present

## 2016-06-04 DIAGNOSIS — S82891A Other fracture of right lower leg, initial encounter for closed fracture: Secondary | ICD-10-CM | POA: Diagnosis not present

## 2016-06-04 DIAGNOSIS — R2689 Other abnormalities of gait and mobility: Secondary | ICD-10-CM | POA: Diagnosis not present

## 2016-06-04 DIAGNOSIS — G894 Chronic pain syndrome: Secondary | ICD-10-CM | POA: Diagnosis present

## 2016-06-04 DIAGNOSIS — F17210 Nicotine dependence, cigarettes, uncomplicated: Secondary | ICD-10-CM | POA: Diagnosis present

## 2016-06-04 DIAGNOSIS — E1165 Type 2 diabetes mellitus with hyperglycemia: Secondary | ICD-10-CM | POA: Diagnosis present

## 2016-06-04 DIAGNOSIS — S82891S Other fracture of right lower leg, sequela: Secondary | ICD-10-CM | POA: Diagnosis not present

## 2016-06-04 DIAGNOSIS — S82424A Nondisplaced transverse fracture of shaft of right fibula, initial encounter for closed fracture: Secondary | ICD-10-CM | POA: Diagnosis present

## 2016-06-04 DIAGNOSIS — E8889 Other specified metabolic disorders: Secondary | ICD-10-CM | POA: Diagnosis present

## 2016-06-04 DIAGNOSIS — S82841A Displaced bimalleolar fracture of right lower leg, initial encounter for closed fracture: Principal | ICD-10-CM | POA: Diagnosis present

## 2016-06-04 DIAGNOSIS — D62 Acute posthemorrhagic anemia: Secondary | ICD-10-CM | POA: Diagnosis not present

## 2016-06-04 DIAGNOSIS — R7989 Other specified abnormal findings of blood chemistry: Secondary | ICD-10-CM | POA: Diagnosis present

## 2016-06-04 DIAGNOSIS — S82899A Other fracture of unspecified lower leg, initial encounter for closed fracture: Secondary | ICD-10-CM | POA: Diagnosis present

## 2016-06-04 DIAGNOSIS — M549 Dorsalgia, unspecified: Secondary | ICD-10-CM | POA: Diagnosis present

## 2016-06-04 DIAGNOSIS — Z6841 Body Mass Index (BMI) 40.0 and over, adult: Secondary | ICD-10-CM

## 2016-06-04 DIAGNOSIS — M25571 Pain in right ankle and joints of right foot: Secondary | ICD-10-CM | POA: Diagnosis not present

## 2016-06-04 DIAGNOSIS — Z794 Long term (current) use of insulin: Secondary | ICD-10-CM | POA: Diagnosis not present

## 2016-06-04 DIAGNOSIS — G4733 Obstructive sleep apnea (adult) (pediatric): Secondary | ICD-10-CM | POA: Diagnosis present

## 2016-06-04 DIAGNOSIS — W1830XA Fall on same level, unspecified, initial encounter: Secondary | ICD-10-CM | POA: Diagnosis present

## 2016-06-04 DIAGNOSIS — S92909A Unspecified fracture of unspecified foot, initial encounter for closed fracture: Secondary | ICD-10-CM | POA: Diagnosis not present

## 2016-06-04 DIAGNOSIS — I129 Hypertensive chronic kidney disease with stage 1 through stage 4 chronic kidney disease, or unspecified chronic kidney disease: Secondary | ICD-10-CM | POA: Diagnosis present

## 2016-06-04 DIAGNOSIS — R9389 Abnormal findings on diagnostic imaging of other specified body structures: Secondary | ICD-10-CM

## 2016-06-04 DIAGNOSIS — Z66 Do not resuscitate: Secondary | ICD-10-CM | POA: Diagnosis present

## 2016-06-04 DIAGNOSIS — F4310 Post-traumatic stress disorder, unspecified: Secondary | ICD-10-CM | POA: Diagnosis present

## 2016-06-04 DIAGNOSIS — E1142 Type 2 diabetes mellitus with diabetic polyneuropathy: Secondary | ICD-10-CM | POA: Diagnosis present

## 2016-06-04 DIAGNOSIS — I10 Essential (primary) hypertension: Secondary | ICD-10-CM | POA: Diagnosis not present

## 2016-06-04 DIAGNOSIS — E1122 Type 2 diabetes mellitus with diabetic chronic kidney disease: Secondary | ICD-10-CM | POA: Diagnosis present

## 2016-06-04 DIAGNOSIS — E119 Type 2 diabetes mellitus without complications: Secondary | ICD-10-CM | POA: Diagnosis present

## 2016-06-04 DIAGNOSIS — R0602 Shortness of breath: Secondary | ICD-10-CM | POA: Diagnosis not present

## 2016-06-04 DIAGNOSIS — E785 Hyperlipidemia, unspecified: Secondary | ICD-10-CM | POA: Diagnosis present

## 2016-06-04 DIAGNOSIS — N183 Chronic kidney disease, stage 3 (moderate): Secondary | ICD-10-CM | POA: Diagnosis present

## 2016-06-04 DIAGNOSIS — Z9989 Dependence on other enabling machines and devices: Secondary | ICD-10-CM

## 2016-06-04 DIAGNOSIS — M810 Age-related osteoporosis without current pathological fracture: Secondary | ICD-10-CM | POA: Diagnosis present

## 2016-06-04 DIAGNOSIS — S93431A Sprain of tibiofibular ligament of right ankle, initial encounter: Secondary | ICD-10-CM | POA: Diagnosis not present

## 2016-06-04 DIAGNOSIS — M1990 Unspecified osteoarthritis, unspecified site: Secondary | ICD-10-CM | POA: Diagnosis present

## 2016-06-04 DIAGNOSIS — K219 Gastro-esophageal reflux disease without esophagitis: Secondary | ICD-10-CM | POA: Diagnosis present

## 2016-06-04 DIAGNOSIS — R079 Chest pain, unspecified: Secondary | ICD-10-CM | POA: Diagnosis not present

## 2016-06-04 DIAGNOSIS — E7800 Pure hypercholesterolemia, unspecified: Secondary | ICD-10-CM | POA: Diagnosis present

## 2016-06-04 DIAGNOSIS — M6281 Muscle weakness (generalized): Secondary | ICD-10-CM | POA: Diagnosis not present

## 2016-06-04 DIAGNOSIS — S82899D Other fracture of unspecified lower leg, subsequent encounter for closed fracture with routine healing: Secondary | ICD-10-CM | POA: Diagnosis not present

## 2016-06-04 DIAGNOSIS — S8251XA Displaced fracture of medial malleolus of right tibia, initial encounter for closed fracture: Secondary | ICD-10-CM | POA: Diagnosis not present

## 2016-06-04 LAB — CBC WITH DIFFERENTIAL/PLATELET
BASOS PCT: 0 %
Basophils Absolute: 0 10*3/uL (ref 0.0–0.1)
EOS PCT: 3 %
Eosinophils Absolute: 0.6 10*3/uL (ref 0.0–0.7)
HEMATOCRIT: 41.3 % (ref 39.0–52.0)
HEMOGLOBIN: 13.6 g/dL (ref 13.0–17.0)
LYMPHS ABS: 2.7 10*3/uL (ref 0.7–4.0)
LYMPHS PCT: 14 %
MCH: 29 pg (ref 26.0–34.0)
MCHC: 32.9 g/dL (ref 30.0–36.0)
MCV: 88.1 fL (ref 78.0–100.0)
MONOS PCT: 10 %
Monocytes Absolute: 2 10*3/uL — ABNORMAL HIGH (ref 0.1–1.0)
NEUTROS ABS: 14.3 10*3/uL — AB (ref 1.7–7.7)
Neutrophils Relative %: 73 %
Platelets: 283 10*3/uL (ref 150–400)
RBC: 4.69 MIL/uL (ref 4.22–5.81)
RDW: 13.9 % (ref 11.5–15.5)
WBC: 19.6 10*3/uL — ABNORMAL HIGH (ref 4.0–10.5)

## 2016-06-04 LAB — COMPREHENSIVE METABOLIC PANEL
ALBUMIN: 3.6 g/dL (ref 3.5–5.0)
ALK PHOS: 80 U/L (ref 38–126)
ALT: 15 U/L — ABNORMAL LOW (ref 17–63)
ANION GAP: 7 (ref 5–15)
AST: 20 U/L (ref 15–41)
BILIRUBIN TOTAL: 0.5 mg/dL (ref 0.3–1.2)
BUN: 31 mg/dL — ABNORMAL HIGH (ref 6–20)
CALCIUM: 9.1 mg/dL (ref 8.9–10.3)
CO2: 26 mmol/L (ref 22–32)
Chloride: 104 mmol/L (ref 101–111)
Creatinine, Ser: 1.2 mg/dL (ref 0.61–1.24)
GFR, EST NON AFRICAN AMERICAN: 54 mL/min — AB (ref >60)
GLUCOSE: 194 mg/dL — AB (ref 65–99)
POTASSIUM: 4.7 mmol/L (ref 3.5–5.1)
Sodium: 137 mmol/L (ref 135–145)
TOTAL PROTEIN: 6.9 g/dL (ref 6.5–8.1)

## 2016-06-04 LAB — GLUCOSE, CAPILLARY: Glucose-Capillary: 117 mg/dL — ABNORMAL HIGH (ref 65–99)

## 2016-06-04 MED ORDER — METHOCARBAMOL 500 MG PO TABS
750.0000 mg | ORAL_TABLET | Freq: Four times a day (QID) | ORAL | Status: DC | PRN
  Administered 2016-06-05 – 2016-06-06 (×4): 750 mg via ORAL
  Filled 2016-06-04 (×5): qty 2

## 2016-06-04 MED ORDER — INSULIN ASPART 100 UNIT/ML ~~LOC~~ SOLN
0.0000 [IU] | Freq: Every day | SUBCUTANEOUS | Status: DC
  Administered 2016-06-05: 2 [IU] via SUBCUTANEOUS

## 2016-06-04 MED ORDER — ACETAMINOPHEN 325 MG PO TABS
650.0000 mg | ORAL_TABLET | Freq: Four times a day (QID) | ORAL | Status: DC | PRN
  Administered 2016-06-05: 650 mg via ORAL
  Filled 2016-06-04: qty 2

## 2016-06-04 MED ORDER — FENTANYL CITRATE (PF) 100 MCG/2ML IJ SOLN
50.0000 ug | Freq: Once | INTRAMUSCULAR | Status: AC
  Administered 2016-06-04: 50 ug via INTRAVENOUS
  Filled 2016-06-04: qty 2

## 2016-06-04 MED ORDER — POLYETHYLENE GLYCOL 3350 17 G PO PACK: 17.0000 g | PACK | Freq: Every day | ORAL | Status: DC | PRN

## 2016-06-04 MED ORDER — SIMVASTATIN 20 MG PO TABS
40.0000 mg | ORAL_TABLET | Freq: Every evening | ORAL | Status: DC
  Administered 2016-06-04 – 2016-06-07 (×4): 40 mg via ORAL
  Filled 2016-06-04 (×4): qty 2

## 2016-06-04 MED ORDER — SENNA 8.6 MG PO TABS
1.0000 | ORAL_TABLET | Freq: Two times a day (BID) | ORAL | Status: DC
Start: 2016-06-04 — End: 2016-06-08
  Administered 2016-06-04 – 2016-06-08 (×8): 8.6 mg via ORAL
  Filled 2016-06-04 (×8): qty 1

## 2016-06-04 MED ORDER — ONDANSETRON HCL 4 MG PO TABS: 4.0000 mg | ORAL_TABLET | Freq: Four times a day (QID) | ORAL | Status: DC | PRN

## 2016-06-04 MED ORDER — INSULIN GLARGINE 100 UNIT/ML ~~LOC~~ SOLN: 42.0000 [IU] | Freq: Every day | SUBCUTANEOUS | Status: DC

## 2016-06-04 MED ORDER — ONDANSETRON HCL 4 MG/2ML IJ SOLN: 4.0000 mg | Freq: Four times a day (QID) | INTRAMUSCULAR | Status: DC | PRN

## 2016-06-04 MED ORDER — BISACODYL 10 MG RE SUPP: 10.0000 mg | Freq: Every day | RECTAL | Status: DC | PRN

## 2016-06-04 MED ORDER — OXYCODONE HCL 5 MG PO TABS
5.0000 mg | ORAL_TABLET | ORAL | Status: DC | PRN
  Administered 2016-06-05 (×2): 5 mg via ORAL
  Filled 2016-06-04 (×3): qty 1

## 2016-06-04 MED ORDER — INSULIN ASPART 100 UNIT/ML ~~LOC~~ SOLN
0.0000 [IU] | Freq: Three times a day (TID) | SUBCUTANEOUS | Status: DC
  Administered 2016-06-05: 2 [IU] via SUBCUTANEOUS
  Administered 2016-06-05: 3 [IU] via SUBCUTANEOUS
  Administered 2016-06-06 – 2016-06-07 (×2): 2 [IU] via SUBCUTANEOUS
  Administered 2016-06-07: 3 [IU] via SUBCUTANEOUS
  Administered 2016-06-08: 2 [IU] via SUBCUTANEOUS

## 2016-06-04 MED ORDER — ACETAMINOPHEN 650 MG RE SUPP: 650.0000 mg | Freq: Four times a day (QID) | RECTAL | Status: DC | PRN

## 2016-06-04 MED ORDER — SODIUM CHLORIDE 0.9 % IV SOLN
INTRAVENOUS | Status: AC
  Administered 2016-06-04: 23:00:00 via INTRAVENOUS

## 2016-06-04 MED ORDER — QUETIAPINE FUMARATE 50 MG PO TABS
50.0000 mg | ORAL_TABLET | Freq: Every day | ORAL | Status: DC
  Administered 2016-06-04 – 2016-06-07 (×4): 50 mg via ORAL
  Filled 2016-06-04 (×4): qty 1

## 2016-06-04 MED ORDER — OXYCODONE-ACETAMINOPHEN 5-325 MG PO TABS
1.0000 | ORAL_TABLET | Freq: Once | ORAL | Status: AC
  Administered 2016-06-04: 1 via ORAL
  Filled 2016-06-04: qty 1

## 2016-06-04 MED ORDER — DOCUSATE SODIUM 100 MG PO CAPS
100.0000 mg | ORAL_CAPSULE | Freq: Two times a day (BID) | ORAL | Status: DC
  Administered 2016-06-04 – 2016-06-06 (×4): 100 mg via ORAL
  Filled 2016-06-04 (×4): qty 1

## 2016-06-04 MED ORDER — INSULIN GLARGINE 100 UNIT/ML ~~LOC~~ SOLN
30.0000 [IU] | Freq: Every day | SUBCUTANEOUS | Status: DC
  Administered 2016-06-04 – 2016-06-07 (×4): 30 [IU] via SUBCUTANEOUS
  Filled 2016-06-04 (×5): qty 0.3

## 2016-06-04 MED ORDER — CARVEDILOL 3.125 MG PO TABS
3.1250 mg | ORAL_TABLET | Freq: Two times a day (BID) | ORAL | Status: DC
  Administered 2016-06-04 – 2016-06-08 (×8): 3.125 mg via ORAL
  Filled 2016-06-04 (×8): qty 1

## 2016-06-04 MED ORDER — MORPHINE SULFATE (PF) 2 MG/ML IV SOLN
0.5000 mg | INTRAVENOUS | Status: DC | PRN
  Administered 2016-06-04 – 2016-06-06 (×7): 0.5 mg via INTRAVENOUS
  Filled 2016-06-04 (×7): qty 1

## 2016-06-04 MED ORDER — ISOSORBIDE MONONITRATE ER 30 MG PO TB24
30.0000 mg | ORAL_TABLET | Freq: Every day | ORAL | Status: DC
  Administered 2016-06-05 – 2016-06-07 (×3): 30 mg via ORAL
  Filled 2016-06-04 (×3): qty 1

## 2016-06-04 NOTE — ED Triage Notes (Signed)
Per EMS pt complaint of right ankle pain and swelling post fall. Pt lost balance with walker. Denies neck/back pain. Pedal pulses present bilaterally. Evident swelling noted to feet bilaterally; worse on right.
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
## 2016-06-04 NOTE — Progress Notes (Signed)
Patient arrived on the unit at approximately 1915. He is alert and verbally responsive. Has a soft splint to right leg. CNS intact. No complaints voiced at this time.

## 2016-06-04 NOTE — ED Notes (Signed)
Delay getting urine sample, sample was thrown away. RN Lilia Pro aware.

## 2016-06-04 NOTE — ED Notes (Signed)
Patient transported to X-ray 

## 2016-06-04 NOTE — ED Notes (Signed)
EKG ordered by Nyoka Cowden. EKG to be completed prior to transport.
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
## 2016-06-05 ENCOUNTER — Inpatient Hospital Stay (HOSPITAL_COMMUNITY): Payer: Medicare Other

## 2016-06-05 LAB — ABO/RH: ABO/RH(D): A NEG

## 2016-06-05 LAB — URINALYSIS, ROUTINE W REFLEX MICROSCOPIC
Bilirubin Urine: NEGATIVE
GLUCOSE, UA: NEGATIVE mg/dL
Hgb urine dipstick: NEGATIVE
Ketones, ur: NEGATIVE mg/dL
NITRITE: NEGATIVE
PH: 5.5 (ref 5.0–8.0)
Protein, ur: NEGATIVE mg/dL
SPECIFIC GRAVITY, URINE: 1.03 (ref 1.005–1.030)

## 2016-06-05 LAB — TYPE AND SCREEN
ABO/RH(D): A NEG
Antibody Screen: NEGATIVE

## 2016-06-05 LAB — COMPREHENSIVE METABOLIC PANEL
ALK PHOS: 84 U/L (ref 38–126)
ALT: 14 U/L — AB (ref 17–63)
AST: 18 U/L (ref 15–41)
Albumin: 3.7 g/dL (ref 3.5–5.0)
Anion gap: 6 (ref 5–15)
BUN: 27 mg/dL — ABNORMAL HIGH (ref 6–20)
CALCIUM: 9.1 mg/dL (ref 8.9–10.3)
CO2: 29 mmol/L (ref 22–32)
CREATININE: 1.22 mg/dL (ref 0.61–1.24)
Chloride: 105 mmol/L (ref 101–111)
GFR, EST NON AFRICAN AMERICAN: 53 mL/min — AB (ref >60)
Glucose, Bld: 128 mg/dL — ABNORMAL HIGH (ref 65–99)
Potassium: 4.6 mmol/L (ref 3.5–5.1)
SODIUM: 140 mmol/L (ref 135–145)
Total Bilirubin: 0.5 mg/dL (ref 0.3–1.2)
Total Protein: 6.8 g/dL (ref 6.5–8.1)

## 2016-06-05 LAB — CBC
HCT: 40.6 % (ref 39.0–52.0)
Hemoglobin: 13.1 g/dL (ref 13.0–17.0)
MCH: 28.7 pg (ref 26.0–34.0)
MCHC: 32.3 g/dL (ref 30.0–36.0)
MCV: 88.8 fL (ref 78.0–100.0)
PLATELETS: 270 10*3/uL (ref 150–400)
RBC: 4.57 MIL/uL (ref 4.22–5.81)
RDW: 14.1 % (ref 11.5–15.5)
WBC: 12.9 10*3/uL — ABNORMAL HIGH (ref 4.0–10.5)

## 2016-06-05 LAB — URINE MICROSCOPIC-ADD ON: RBC / HPF: NONE SEEN RBC/hpf (ref 0–5)

## 2016-06-05 LAB — GLUCOSE, CAPILLARY
GLUCOSE-CAPILLARY: 98 mg/dL (ref 65–99)
GLUCOSE-CAPILLARY: 203 mg/dL — AB (ref 65–99)
Glucose-Capillary: 149 mg/dL — ABNORMAL HIGH (ref 65–99)
Glucose-Capillary: 164 mg/dL — ABNORMAL HIGH (ref 65–99)

## 2016-06-05 LAB — MAGNESIUM: Magnesium: 1.8 mg/dL (ref 1.7–2.4)

## 2016-06-05 LAB — TSH: TSH: 1.252 u[IU]/mL (ref 0.350–4.500)

## 2016-06-05 LAB — PHOSPHORUS: Phosphorus: 3.2 mg/dL (ref 2.5–4.6)

## 2016-06-05 MED ORDER — OXYCODONE HCL 5 MG PO TABS
10.0000 mg | ORAL_TABLET | ORAL | Status: DC | PRN
  Administered 2016-06-05 – 2016-06-06 (×3): 10 mg via ORAL
  Filled 2016-06-05 (×3): qty 2

## 2016-06-05 NOTE — Progress Notes (Signed)
PT Cancellation Note  Patient Details Name: [name] MRN: [id number] DOB: Mar 05, 1932   Cancelled Treatment:    Reason Eval/Treat Not Completed: Pain limiting ability to participate. Patient and wife report that nhe is to have surgery but not sure when. Will follow along.   Claretha Cooper 06/05/2016, 9:27 AM Tresa Endo PT [phone]

## 2016-06-05 NOTE — Progress Notes (Signed)
OT Cancellation Note  Patient Details Name: [name] MRN: [id number] DOB: 1932/03/21   Cancelled Treatment:    Reason Eval/Treat Not Completed: Other (comment).  Pt is in too much pain; plan is for sx per pt/wife. Will check back after this  Khamiya Varin 06/05/2016, 10:08 AM  Lesle Chris, OTR/L [phone] 06/05/2016

## 2016-06-05 NOTE — Progress Notes (Signed)
[facility] PROGRESS NOTE  [name] JAS:[id number] DOB: [birth year] DOA: 06/04/2016  PCP: Stephens Shire, MD  Brief History/Interval Summary: 82 year old Caucasian male with a past medical history of obstructive sleep apnea, hypertension, chronic kidney disease stage III, diabetes resented after a mechanical fall which resulted in right ankle pain and swelling. Patient was found to have bimalleolar fracture. Patient was not able to ambulate. He was hospitalized for further management.  Reason for Visit: Ankle fracture  Consultants: Orthopedics  Procedures: None yet  Antibiotics: None  Subjective/Interval History: Patient states that occasionally he has severe pain in his right ankle. Currently, he is comfortable. He denies any nausea, vomiting. No chest pain or shortness of breath.  ROS: No nausea or vomiting  Objective:  Vital Signs  Vitals:   06/04/16 1816 06/04/16 1845 06/04/16 1925 06/05/16 0458  BP: 120/70  111/67 123/70  Pulse: 85  96 96  Resp: 15  16 16   Temp: 98 F (36.7 C)  98.3 F (36.8 C) 99.1 F (37.3 C)  TempSrc: Oral  Oral Oral  SpO2: 96%  98% 95%  Weight:  121.6 kg (268 lb)    Height:  5\' 11"  (1.803 m)      Intake/Output Summary (Last 24 hours) at 06/05/16 0959 Last data filed at 06/05/16 0900  Gross per 24 hour  Intake              740 ml  Output              700 ml  Net               40 ml   Filed Weights   06/04/16 1845  Weight: 121.6 kg (268 lb)    General appearance: alert, cooperative, appears stated age and no distress Resp: clear to auscultation bilaterally Cardio: regular rate and rhythm, S1, S2 normal, no murmur, click, rub or gallop GI: soft, non-tender; bowel sounds normal; no masses,  no organomegaly Extremities: Right lower extremities covered in a wrap Neurologic: Awake and alert. No focal neurological deficits.  Lab Results:  Data Reviewed: I have personally reviewed following labs and imaging  studies  CBC:  Recent Labs Lab 06/04/16 1706 06/05/16 0448  WBC 19.6* 12.9*  NEUTROABS 14.3*  --   HGB 13.6 13.1  HCT 41.3 40.6  MCV 88.1 88.8  PLT 283 419    Basic Metabolic Panel:  Recent Labs Lab 06/04/16 1706 06/05/16 0448  NA 137 140  K 4.7 4.6  CL 104 105  CO2 26 29  GLUCOSE 194* 128*  BUN 31* 27*  CREATININE 1.20 1.22  CALCIUM 9.1 9.1  MG  --  1.8  PHOS  --  3.2    GFR: Estimated Creatinine Clearance: 61.9 mL/min (by C-G formula based on SCr of 1.22 mg/dL).  Liver Function Tests:  Recent Labs Lab 06/04/16 1706 06/05/16 0448  AST 20 18  ALT 15* 14*  ALKPHOS 80 84  BILITOT 0.5 0.5  PROT 6.9 6.8  ALBUMIN 3.6 3.7    CBG:  Recent Labs Lab 06/04/16 2242 06/05/16 0716  GLUCAP 117* 98   Thyroid Function Tests:  Recent Labs  06/05/16 0448  TSH 1.252     Radiology Studies: Dg Chest 2 View  Result Date: 06/04/2016 CLINICAL DATA:  Initial evaluation for acute chest pain. History of diabetes, hypertension. EXAM: CHEST  2 VIEW COMPARISON:  Prior radiograph from 05/23/2016. FINDINGS: Examination is somewhat technically limited by lordotic angulation. Patient is also rotated to  the right. Allowing for positioning, cardiac and mediastinal silhouettes grossly stable. Lungs are mildly hypoinflated. Mild bibasilar atelectatic changes. No definite focal infiltrates. No pulmonary edema or pleural effusion. There is question of a small pleural edge at the left lung apex, favored to be artifactual on this exam due to technique, although a small apical pneumothorax is not entirely excluded. No acute osseous abnormality. Multilevel degenerative changes noted within the visualized spine. IMPRESSION: 1. Question small left apical pneumothorax as above, favored to be artifactual on this exam due to technique (lordotic nature of the film and rotation of the patient). A repeat AP/PA radiograph with improved positioning is recommended for further evaluation. 2. Shallow  lung inflation with mild bibasilar atelectasis. No other active cardiopulmonary disease. Electronically Signed   By: Jeannine Boga M.D.   On: 06/04/2016 17:33   Dg Ankle Complete Right  Result Date: 06/04/2016 CLINICAL DATA:  Fall this a.m. while putting pants on and injured right ankle. States his foot "bent underneath him". H/o right distal fibula fracture x approx. 20 years ago per patient. Also h/o right ORIF calcaneous surgery last year per chart. EXAM: RIGHT ANKLE - COMPLETE 3+ VIEW COMPARISON:  CT, 02/12/2015 FINDINGS: There is acute fracture of the medial malleolus, obliquely across its base, minimally displaced medially by 2-3 mm. No comminution. Hip fracture line projects along the posterior margin of the distal fibula adjacent to the second proximal screw of the lateral fibular fixation plate. This fracture line was not present on the prior CT. No other evidence of a fracture. The ankle mortise is normally spaced and aligned. Two screws have been inserted across the calcaneal tuberosity. There is diffuse soft tissue swelling. IMPRESSION: 1. Acute fracture of the medial malleolus with 2-3 mm of medial displacement. 2. Possible acute nondisplaced fracture of the distal fibula, at the level of the upper fixation plate. 3. No other evidence of a new acute fracture.  No dislocation. Electronically Signed   By: Lajean Manes M.D.   On: 06/04/2016 15:09   Dg Chest Port 1 View  Result Date: 06/05/2016 CLINICAL DATA:  Short of breath today. Follow-up for abnormal chest radiograph. EXAM: PORTABLE CHEST 1 VIEW COMPARISON:  06/04/2016 FINDINGS: Tiny left apical pneumothorax suggested on the previous exam is not evident on the current study. Cardiac silhouette is mildly enlarged. No convincing mediastinal or hilar masses. Lung volumes remain low.  Lungs are clear. IMPRESSION: 1. No evidence of a pneumothorax on the current exam. 2. No acute cardiopulmonary disease. Electronically Signed   By: Lajean Manes M.D.   On: 06/05/2016 09:30     Medications:  Scheduled: . carvedilol  3.125 mg Oral BID WC  . docusate sodium  100 mg Oral BID  . insulin aspart  0-15 Units Subcutaneous TID WC  . insulin aspart  0-5 Units Subcutaneous QHS  . insulin glargine  30 Units Subcutaneous QHS  . isosorbide mononitrate  30 mg Oral Daily  . QUEtiapine  50 mg Oral QHS  . senna  1 tablet Oral BID  . simvastatin  40 mg Oral QPM   Continuous:  MWN:[id number] **OR** acetaminophen, bisacodyl, methocarbamol, morphine injection, ondansetron **OR** ondansetron (ZOFRAN) IV, oxyCODONE, polyethylene glycol  Assessment/Plan:  Active Problems:   Diabetes ([place])   Essential hypertension, benign   OSA on CPAP   Chronic pain syndrome   Morbid obesity due to excess calories ([place])   CKD stage 3 due to type 2 diabetes mellitus ([place])   Type 2 diabetes with nephropathy (  [place])   Fracture, ankle   Ankle fracture    Right ankle fracture Await orthopedic input. Pain control for now.  Chronic kidney disease stage III secondary to diabetes. Creatinine appears to be stable. Continue to monitor urine output. Repeat labs every day.  History of essential hypertension. Continue with home medications. Monitor blood pressures closely. Seems to be reasonably well controlled.  Morbid obesity due to excess calories. BMI is 41. Would benefit from nutritional consult eventually.  History of type 2 diabetes with nephropathy Lantus dose was decreased due to possible surgery. Continue sliding scale insulin coverage.  History of obstructive sleep apnea. CPAP  Leukocytosis Patient with WBC of 19.6 at the time of admission. 12.9 this morning. He is afebrile. No obvious evidence for infection. Check UA. Could be reactive due to stress. Continue to monitor closely.  Abnormal chest x-ray. X-ray done at the time of admission, raised concern for pneumothorax. However, it was felt to be artifactual. X-ray repeated this morning  and does not show any evidence for pneumothorax. No further testing needed at this time.  DVT Prophylaxis: SCDs. Definitive prophylaxis, depending on if he needs surgical intervention.    Code Status: DO NOT RESUSCITATE  Family Communication: Discussed with the patient  Disposition Plan: Await orthopedic input.    LOS: 1 day   [facility] Pager [phone] 06/05/2016, 9:59 AM  If 7PM-7AM, please contact night-coverage at [email or web address], password [facility]

## 2016-06-05 NOTE — Progress Notes (Signed)
Spoke with pt regarding nocturnal cpap.  Pt declined cpap tonight stating that he has tried our machine in a past stay and could not wear it.  Pt states he can have his wife bring his home machine in tomorrow night if needed.  Pt was advised that RT is available all night should he change his mind.

## 2016-06-05 NOTE — Progress Notes (Signed)
Pt requesting more pain meds. Reports oxy ir 5 mg is not helping and that he usually takes 15 mg at home prn. Dr. [place] Pink made aware. New order given for 10 mg every 4 hours prn. Madelin Headings.
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
## 2016-06-06 ENCOUNTER — Inpatient Hospital Stay (HOSPITAL_COMMUNITY): Payer: Medicare Other

## 2016-06-06 DIAGNOSIS — S82891S Other fracture of right lower leg, sequela: Secondary | ICD-10-CM

## 2016-06-06 LAB — CBC
HEMATOCRIT: 37.3 % — AB (ref 39.0–52.0)
Hemoglobin: 11.9 g/dL — ABNORMAL LOW (ref 13.0–17.0)
MCH: 28.6 pg (ref 26.0–34.0)
MCHC: 31.9 g/dL (ref 30.0–36.0)
MCV: 89.7 fL (ref 78.0–100.0)
PLATELETS: 250 10*3/uL (ref 150–400)
RBC: 4.16 MIL/uL — ABNORMAL LOW (ref 4.22–5.81)
RDW: 14.2 % (ref 11.5–15.5)
WBC: 13.1 10*3/uL — AB (ref 4.0–10.5)

## 2016-06-06 LAB — GLUCOSE, CAPILLARY
GLUCOSE-CAPILLARY: 91 mg/dL (ref 65–99)
GLUCOSE-CAPILLARY: 130 mg/dL — AB (ref 65–99)
GLUCOSE-CAPILLARY: 112 mg/dL — AB (ref 65–99)
Glucose-Capillary: 196 mg/dL — ABNORMAL HIGH (ref 65–99)

## 2016-06-06 LAB — BASIC METABOLIC PANEL
ANION GAP: 7 (ref 5–15)
BUN: 30 mg/dL — ABNORMAL HIGH (ref 6–20)
CALCIUM: 8.7 mg/dL — AB (ref 8.9–10.3)
CO2: 28 mmol/L (ref 22–32)
CREATININE: 1.36 mg/dL — AB (ref 0.61–1.24)
Chloride: 105 mmol/L (ref 101–111)
GFR, EST AFRICAN AMERICAN: 54 mL/min — AB (ref >60)
GFR, EST NON AFRICAN AMERICAN: 47 mL/min — AB (ref >60)
Glucose, Bld: 124 mg/dL — ABNORMAL HIGH (ref 65–99)
Potassium: 4.3 mmol/L (ref 3.5–5.1)
SODIUM: 140 mmol/L (ref 135–145)

## 2016-06-06 LAB — HEMOGLOBIN A1C
HEMOGLOBIN A1C: 8.4 % — AB (ref 4.8–5.6)
Mean Plasma Glucose: 194 mg/dL

## 2016-06-06 MED ORDER — HYDROMORPHONE HCL 1 MG/ML IJ SOLN
1.0000 mg | INTRAMUSCULAR | Status: DC | PRN
  Administered 2016-06-06 – 2016-06-08 (×5): 1 mg via INTRAVENOUS
  Filled 2016-06-06 (×5): qty 1

## 2016-06-06 MED ORDER — BISACODYL 10 MG RE SUPP: 10.0000 mg | Freq: Once | RECTAL | Status: DC

## 2016-06-06 NOTE — Progress Notes (Addendum)
Patient ID: [name], male   DOB: 13-Apr-1932, 82 y.o.   MRN: [id number]   PROGRESS NOTE    [name]  ZHY:[id number] DOB: [birth year] DOA: 06/04/2016  PCP: Stephens Shire, MD   Brief Narrative:  82 year old male with a past medical history significant for obstructive sleep apnea, hypertension, chronic kidney disease stage III, diabetes who presented to [facility] long hospital status post mechanical fall and has subsequently sustained right ankle fracture. Orthopedic surgery has seen the patient in consultation.  Assessment & Plan:   Active Problems: Mechanical fall / acute fracture of the medial malleolus with displacement - Patient sustained acute fracture of the medial malleolus with 2-3 mm medial displacement and possible acute nondisplaced fracture of the distal fibula; status post mechanical fall - Appreciate orthopedic surgery consult and recommendations - Continue pain management efforts    Uncontrolled diabetes mellitus with diabetic nephropathy with long-term insulin use - A1c on this admission is 8.4 indicating poor glycemic control - Patient is currently on Lantus 30 units at bedtime along with sliding scale insulin - CBGs in past 12 hours: 164, 203, 91    Essential hypertension, benign - Continue carvedilol 3.125 mg twice daily, isosorbide mononitrate 30 mg daily    Dyslipidemia associated without 2 diabetes mellitus - Continue simvastatin 40 mg at bedtime    OSA on CPAP - Stable respiratory status    Morbid obesity due to excess calories (HCC) - Body mass index is 37.38 kg/m - Counseled on nutrition, diet    CKD stage 3 due to type 2 diabetes mellitus (HCC) - Baseline creatinine 1.4 about 2 weeks ago - Creatinine 1.3 this morning, within baseline range    Depression - Continue Seroquel    Leukocytosis - Unclear etiology - No evidence of acute infectious process - Continue to monitor CBC daily   DVT prophylaxis: SCDs bilaterally Code Status:  DNR/DNI  Family Communication: Patient's wife at the bedside Disposition Plan: Plan for possible surgery either today or tomorrow   Consultants:   Orthopedic surgery  PT  Procedures:   None  Antimicrobials:   None    Subjective: Pain is 10/10 in his right leg.  Objective: Vitals:   06/05/16 0458 06/05/16 1426 06/05/16 2028 06/06/16 0508  BP: 123/70 113/64 135/72 137/69  Pulse: 96 96 97 92  Resp: 16 16 16 18   Temp: 99.1 F (37.3 C) 97.6 F (36.4 C) 99 F (37.2 C) 99.6 F (37.6 C)  TempSrc: Oral Oral Oral Oral  SpO2: 95% 96% 96% 93%  Weight:      Height:        Intake/Output Summary (Last 24 hours) at 06/06/16 0955 Last data filed at 06/06/16 0700  Gross per 24 hour  Intake              840 ml  Output             1850 ml  Net            -1010 ml   Filed Weights   06/04/16 1845  Weight: 121.6 kg (268 lb)    Examination:  General exam: Appears calm and comfortable  Respiratory system: Clear to auscultation. Respiratory effort normal. Cardiovascular system: S1 & S2 heard, RRR. No JVD, murmurs Gastrointestinal system: Abdomen is nondistended, soft and nontender. No organomegaly or masses felt. Normal bowel sounds heard. Central nervous system: Alert and oriented. No focal neurological deficits. Extremities: Right leg with ace dressing Skin: No rashes, lesions or ulcers Psychiatry: Judgement  and insight appear normal. Mood & affect appropriate.   Data Reviewed: I have personally reviewed following labs and imaging studies  CBC:  Recent Labs Lab 06/04/16 1706 06/05/16 0448 06/06/16 0404  WBC 19.6* 12.9* 13.1*  NEUTROABS 14.3*  --   --   HGB 13.6 13.1 11.9*  HCT 41.3 40.6 37.3*  MCV 88.1 88.8 89.7  PLT 283 270 696   Basic Metabolic Panel:  Recent Labs Lab 06/04/16 1706 06/05/16 0448 06/06/16 0404  NA 137 140 140  K 4.7 4.6 4.3  CL 104 105 105  CO2 26 29 28   GLUCOSE 194* 128* 124*  BUN 31* 27* 30*  CREATININE 1.20 1.22 1.36*  CALCIUM  9.1 9.1 8.7*  MG  --  1.8  --   PHOS  --  3.2  --    GFR: Estimated Creatinine Clearance: 55.6 mL/min (by C-G formula based on SCr of 1.36 mg/dL (H)). Liver Function Tests:  Recent Labs Lab 06/04/16 1706 06/05/16 0448  AST 20 18  ALT 15* 14*  ALKPHOS 80 84  BILITOT 0.5 0.5  PROT 6.9 6.8  ALBUMIN 3.6 3.7   No results for input(s): LIPASE, AMYLASE in the last 168 hours. No results for input(s): AMMONIA in the last 168 hours. Coagulation Profile: No results for input(s): INR, PROTIME in the last 168 hours. Cardiac Enzymes: No results for input(s): CKTOTAL, CKMB, CKMBINDEX, TROPONINI in the last 168 hours. BNP (last 3 results) No results for input(s): PROBNP in the last 8760 hours. HbA1C:  Recent Labs  06/05/16 0448  HGBA1C 8.4*   CBG:  Recent Labs Lab 06/05/16 0716 06/05/16 1249 06/05/16 1617 06/05/16 2109 06/06/16 0738  GLUCAP 98 149* 164* 203* 91   Lipid Profile: No results for input(s): CHOL, HDL, LDLCALC, TRIG, CHOLHDL, LDLDIRECT in the last 72 hours. Thyroid Function Tests:  Recent Labs  06/05/16 0448  TSH 1.252   Anemia Panel: No results for input(s): VITAMINB12, FOLATE, FERRITIN, TIBC, IRON, RETICCTPCT in the last 72 hours. Urine analysis:    Component Value Date/Time   COLORURINE AMBER (A) 06/05/2016 1008   APPEARANCEUR CLEAR 06/05/2016 1008   LABSPEC 1.030 06/05/2016 1008   PHURINE 5.5 06/05/2016 1008   GLUCOSEU NEGATIVE 06/05/2016 1008   HGBUR NEGATIVE 06/05/2016 1008   BILIRUBINUR NEGATIVE 06/05/2016 1008   KETONESUR NEGATIVE 06/05/2016 1008   PROTEINUR NEGATIVE 06/05/2016 1008   UROBILINOGEN 1.0 11/22/2012 1633   NITRITE NEGATIVE 06/05/2016 1008   LEUKOCYTESUR TRACE (A) 06/05/2016 1008   Sepsis Labs: @LABRCNTIP (procalcitonin:4,lacticidven:4)   )No results found for this or any previous visit (from the past 240 hour(s)).    Radiology Studies: Dg Chest 2 View Result Date: 06/04/2016 1. Question small left apical pneumothorax as  above, favored to be artifactual on this exam due to technique (lordotic nature of the film and rotation of the patient). A repeat AP/PA radiograph with improved positioning is recommended for further evaluation. 2. Shallow lung inflation with mild bibasilar atelectasis. No other active cardiopulmonary disease. Electronically Signed   By: Jeannine Boga M.D.   On: 06/04/2016 17:33   Dg Ankle Complete Right Result Date: 06/04/2016  1. Acute fracture of the medial malleolus with 2-3 mm of medial displacement. 2. Possible acute nondisplaced fracture of the distal fibula, at the level of the upper fixation plate. 3. No other evidence of a new acute fracture.  No dislocation. Electronically Signed   By: Lajean Manes M.D.   On: 06/04/2016 15:09   Dg Chest [facility] 1 View Result  Date: 06/05/2016 1. No evidence of a pneumothorax on the current exam. 2. No acute cardiopulmonary disease. Electronically Signed   By: Lajean Manes M.D.   On: 06/05/2016 09:30     Scheduled Meds: . bisacodyl  10 mg Rectal Once  . carvedilol  3.125 mg Oral BID WC  . insulin aspart  0-15 Units Subcutaneous TID WC  . insulin aspart  0-5 Units Subcutaneous QHS  . insulin glargine  30 Units Subcutaneous QHS  . isosorbide mononitrate  30 mg Oral Daily  . QUEtiapine  50 mg Oral QHS  . senna  1 tablet Oral BID  . simvastatin  40 mg Oral QPM   Continuous Infusions:    LOS: 2 days    Time spent: 25 minutes  Greater than 50% of the time spent on counseling and coordinating the care.   [name], MD [facility] Pager [phone]  If 7PM-7AM, please contact night-coverage [email or web address] Password [email or web address] 06/06/2016, 9:55 AM
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
## 2016-06-06 NOTE — Progress Notes (Signed)
Report received from Blaine Hamper, RN. No change in previous assessment.

## 2016-06-06 NOTE — Progress Notes (Signed)
PT Cancellation Note  Patient Details Name: [name] MRN: [id number] DOB: [birth year]   Cancelled Treatment:     PT initially deferred pending ortho input for possible surgery Mon/Tues.  This pm ortho changing recommendation to surgery in ~ 10 days.  PT eval attempted but pt being taken for CT.  Will follow in am.   Mikeisha Lemonds 06/06/2016, 4:55 PM

## 2016-06-06 NOTE — Progress Notes (Signed)
OT Cancellation Note  Patient Details Name: JACHIN COURY MRN: [id number] DOB: 24-Jan-1932   Cancelled Treatment:    Reason Eval/Treat Not Completed: Other (comment)  Noted surgery today or tomorrow- will perform OT eval post surgery.  Will await new order  Kari Baars, [place]  Payton Mccallum D 06/06/2016, 12:39 PM

## 2016-06-06 NOTE — Progress Notes (Signed)
Patient refuses to wear cpap tonight. Explained to patient that if he should change his mind to let his RN know so she can call us.

## 2016-06-07 LAB — URINE CULTURE

## 2016-06-07 LAB — CBC
HEMATOCRIT: 34.3 % — AB (ref 39.0–52.0)
HEMOGLOBIN: 11.2 g/dL — AB (ref 13.0–17.0)
MCH: 28.1 pg (ref 26.0–34.0)
MCHC: 32.7 g/dL (ref 30.0–36.0)
MCV: 86.2 fL (ref 78.0–100.0)
Platelets: 236 10*3/uL (ref 150–400)
RBC: 3.98 MIL/uL — AB (ref 4.22–5.81)
RDW: 13.7 % (ref 11.5–15.5)
WBC: 11.3 10*3/uL — AB (ref 4.0–10.5)

## 2016-06-07 LAB — BASIC METABOLIC PANEL
ANION GAP: 6 (ref 5–15)
BUN: 37 mg/dL — ABNORMAL HIGH (ref 6–20)
CALCIUM: 8.8 mg/dL — AB (ref 8.9–10.3)
CHLORIDE: 103 mmol/L (ref 101–111)
CO2: 27 mmol/L (ref 22–32)
Creatinine, Ser: 1.38 mg/dL — ABNORMAL HIGH (ref 0.61–1.24)
GFR calc non Af Amer: 46 mL/min — ABNORMAL LOW (ref >60)
GFR, EST AFRICAN AMERICAN: 53 mL/min — AB (ref >60)
GLUCOSE: 169 mg/dL — AB (ref 65–99)
POTASSIUM: 4.6 mmol/L (ref 3.5–5.1)
Sodium: 136 mmol/L (ref 135–145)

## 2016-06-07 LAB — GLUCOSE, CAPILLARY
GLUCOSE-CAPILLARY: 163 mg/dL — AB (ref 65–99)
GLUCOSE-CAPILLARY: 166 mg/dL — AB (ref 65–99)
Glucose-Capillary: 141 mg/dL — ABNORMAL HIGH (ref 65–99)

## 2016-06-07 MED ORDER — HYDROMORPHONE HCL 2 MG PO TABS
2.0000 mg | ORAL_TABLET | ORAL | Status: DC | PRN
  Administered 2016-06-07 – 2016-06-08 (×4): 2 mg via ORAL
  Filled 2016-06-07 (×4): qty 1

## 2016-06-07 NOTE — Progress Notes (Signed)
OT Cancellation Note  Patient Details Name: [name] MRN: [id number] DOB: [birth year]   Cancelled Treatment:    Reason Eval/Treat Not Completed: Other (comment).  Note plan is SNF initially with ORIF in 10 days per last MD note. Will defer OT to next venue of care.  Kadeja Granada 06/07/2016, 7:23 AM  Lesle Chris, OTR/L [phone] 06/07/2016

## 2016-06-07 NOTE — NC FL2 (Signed)
[place] LEVEL OF CARE SCREENING TOOL     IDENTIFICATION  Patient Name: [name] Birthdate: [birth year] Sex: male Admission Date (Current Location): 06/04/2016  [facility] and [place] Number:  Herbalist and Address:  [facility],  [place] [address], [place]      Provider Number: [id number]  Attending Physician Name and Address:  [name], MD  Relative Name and Phone Number:       Current Level of Care: Hospital Recommended Level of Care: [place] Prior Approval Number:    Date Approved/Denied:   PASRR Number:    Discharge Plan: SNF    Current Diagnoses: Patient Active Problem List   Diagnosis Date Noted  . Fracture, ankle 06/04/2016  . Ankle fracture 06/04/2016  . Urinary retention   . Type 2 diabetes with nephropathy ([place])   . Pneumobilia 02/13/2016  . Acute pyelonephritis: Probable 02/13/2016  . UTI (lower urinary tract infection) 02/11/2016  . CKD stage 3 due to type 2 diabetes mellitus ([place]) 02/11/2016  . OSA on CPAP 11/30/2015  . Chronic post-traumatic stress disorder (PTSD) 11/30/2015  . Chronic pain syndrome 11/30/2015  . Morbid obesity due to excess calories ([place]) 11/30/2015  . Nocturia more than twice per night 11/30/2015  . Closed displaced bimalleolar fracture of right ankle 02/20/2015  . Bimalleolar ankle fracture 02/20/2015  . Duodenal ulcer, perforated s/p repair/omental patch 11/26/2012  . Helicobacter pylori gastritis 11/26/2012  . Lumbosacral radiculopathy at L5 11/12/2012  . Herniated lumbar intervertebral disc 11/12/2012  . Hyperglycemia 11/12/2012  . Essential hypertension, benign 11/12/2012  . Unspecified constipation 11/12/2012  . Diabetes ([place]) 11/09/2012  . History of back surgery 11/09/2012  . Obesity 11/09/2012    Orientation RESPIRATION BLADDER Height & Weight     Self, Time, Situation, Place  Normal Continent Weight: 268 lb (121.6 kg) Height:  5\' 11"  (180.3  cm)  BEHAVIORAL SYMPTOMS/MOOD NEUROLOGICAL BOWEL NUTRITION STATUS      Continent Diet (carb modified)  AMBULATORY STATUS COMMUNICATION OF NEEDS Skin   Extensive Assist Verbally Normal                       Personal Care Assistance Level of Assistance  Bathing, Dressing Bathing Assistance: Maximum assistance   Dressing Assistance: Maximum assistance     Functional Limitations Info  Sight, Hearing Sight Info: Impaired Hearing Info: Impaired (hearing aids in both ears)      SPECIAL CARE FACTORS FREQUENCY  PT (By licensed PT), OT (By licensed OT)     PT Frequency: 5 times week OT Frequency: 5 times week            Contractures      Additional Factors Info  Code Status, Allergies Code Status Info: DNR Allergies Info: no known allergies           Current Medications (06/07/2016):  This is the current hospital active medication list Current Facility-Administered Medications  Medication Dose Route Frequency Provider Last Rate Last Dose  . acetaminophen (TYLENOL) tablet 650 mg  650 mg Oral Q6H PRN Toy Baker, MD   650 mg at 06/05/16 9326   Or  . acetaminophen (TYLENOL) suppository 650 mg  650 mg Rectal Q6H PRN Toy Baker, MD      . bisacodyl (DULCOLAX) suppository 10 mg  10 mg Rectal Once [name], MD      . carvedilol (COREG) tablet 3.125 mg  3.125 mg Oral BID WC Anastassia  Doutova, MD   3.125 mg at 06/07/16 0845  . HYDROmorphone (DILAUDID) injection 1 mg  1 mg Intravenous Q3H PRN [name], MD   1 mg at 06/07/16 1208  . HYDROmorphone (DILAUDID) tablet 2 mg  2 mg Oral Q3H PRN [name], MD   2 mg at 06/07/16 1001  . insulin aspart (novoLOG) injection 0-15 Units  0-15 Units Subcutaneous TID WC Toy Baker, MD   2 Units at 06/07/16 0844  . insulin aspart (novoLOG) injection 0-5 Units  0-5 Units Subcutaneous QHS Toy Baker, MD   2 Units at 06/05/16 2117  . insulin glargine (LANTUS) injection 30 Units  30 Units Subcutaneous  QHS Toy Baker, MD   30 Units at 06/06/16 2153  . isosorbide mononitrate (IMDUR) 24 hr tablet 30 mg  30 mg Oral Daily Toy Baker, MD   30 mg at 06/07/16 1000  . methocarbamol (ROBAXIN) tablet 750 mg  750 mg Oral Q6H PRN Toy Baker, MD   750 mg at 06/06/16 0528  . ondansetron (ZOFRAN) tablet 4 mg  4 mg Oral Q6H PRN Toy Baker, MD       Or  . ondansetron (ZOFRAN) injection 4 mg  4 mg Intravenous Q6H PRN Toy Baker, MD      . polyethylene glycol (MIRALAX / GLYCOLAX) packet 17 g  17 g Oral Daily PRN Toy Baker, MD      . QUEtiapine (SEROQUEL) tablet 50 mg  50 mg Oral QHS Toy Baker, MD   50 mg at 06/06/16 2153  . senna (SENOKOT) tablet 8.6 mg  1 tablet Oral BID Toy Baker, MD   8.6 mg at 06/07/16 0845  . simvastatin (ZOCOR) tablet 40 mg  40 mg Oral QPM Toy Baker, MD   40 mg at 06/06/16 1739     Discharge Medications: Please see discharge summary for a list of discharge medications.  Relevant Imaging Results:  Relevant Lab Results:   Additional Information [id number]  Carlean Jews, LCSW

## 2016-06-07 NOTE — Progress Notes (Signed)
Patient ID: [name], male   DOB: 1932-04-27, 82 y.o.   MRN: [id number]   PROGRESS NOTE    KASPIAN MUCCIO  ZDG:[id number] DOB: Mar 27, 1932 DOA: 06/04/2016  PCP: Stephens Shire, MD   Brief Narrative:  82 year old male with a past medical history significant for obstructive sleep apnea, hypertension, chronic kidney disease stage III, diabetes who presented to [facility] long hospital status post mechanical fall and has subsequently sustained right ankle fracture. Orthopedic surgery has seen the patient in consultation.  Assessment & Plan:   Active Problems: Mechanical fall / acute fracture of the medial malleolus with displacement - Patient sustained acute fracture of the medial malleolus with 2-3 mm medial displacement and possible acute nondisplaced fracture of the distal fibula; status post mechanical fall - Per orthopedic surgery, recommendation for discharge to skilled nursing facility and delay ORIF due to swelling in about 10 days  - NWBR on right leg  - Patient is in significant amount of pain. Oxycodone is really not helping with pain relief. We will try low-dose Dilaudid by mouth 2 mg every 3 hours as needed for moderate pain and he still has Dilaudid 1 mg IV every 3 hours as needed for severe pain    Uncontrolled diabetes mellitus with diabetic nephropathy with long-term insulin use - A1c on this admission is 8.4 indicating poor glycemic control - Patient is currently on Lantus 30 units at bedtime along with sliding scale insulin - CBGs in past 12 hours: 112, 196, 141    Essential hypertension, benign - Continue carvedilol 3.125 mg twice daily, isosorbide mononitrate 30 mg daily    Dyslipidemia associated without 2 diabetes mellitus - Continue simvastatin 40 mg at bedtime    OSA on CPAP - Stable respiratory status    Morbid obesity due to excess calories (HCC) - Body mass index is 37.38 kg/m - Counseled on nutrition, diet    CKD stage 3 due to type 2 diabetes mellitus  (HCC) - Baseline creatinine 1.4 about 2 weeks ago - Creatinine within baseline range    Depression - Continue Seroquel    Leukocytosis - Unclear etiology - No evidence of acute infectious process   DVT prophylaxis: SCDs bilaterally Code Status: DNR/DNI  Family Communication: Patient's wife at the bedside Disposition Plan: Appreciate SW assisting with discharge plan, not sure that pt will be discharge prior to surgery as he is in quite a lot of pain with even minimal movement and he is already on dilaudid PO and IV and still reports uncontrolled pain.    Consultants:   Orthopedic surgery  PT  Procedures:   None  Antimicrobials:   None    Subjective: Pain is 10/10 in his right leg.  Objective: Vitals:   06/06/16 0508 06/06/16 1424 06/06/16 2150 06/07/16 0504  BP: 137/69 (!) 106/53 (!) 117/52 112/61  Pulse: 92 95 95 89  Resp: 18 16 16 16   Temp: 99.6 F (37.6 C) 98.3 F (36.8 C) 99 F (37.2 C) 98.5 F (36.9 C)  TempSrc: Oral Oral Oral Oral  SpO2: 93% 95% 93% 92%  Weight:      Height:        Intake/Output Summary (Last 24 hours) at 06/07/16 0949 Last data filed at 06/07/16 0737  Gross per 24 hour  Intake              800 ml  Output             1500 ml  Net             -  700 ml   Filed Weights   06/04/16 1845  Weight: 121.6 kg (268 lb)    Examination:  General exam: No distress Respiratory system: No wheezing, no rhonchi  Cardiovascular system: S1 & S2 heard, Rate controlled  Gastrointestinal system: Abdomen is obese, non tender, (+) BS Central nervous system: No focal neurological deficits. Extremities: Right leg swelling, dressing in place  Skin: warm and dry  Psychiatry: Normal mood and behavior   Data Reviewed: I have personally reviewed following labs and imaging studies  CBC:  Recent Labs Lab 06/04/16 1706 06/05/16 0448 06/06/16 0404 06/07/16 0438  WBC 19.6* 12.9* 13.1* 11.3*  NEUTROABS 14.3*  --   --   --   HGB 13.6 13.1 11.9*  11.2*  HCT 41.3 40.6 37.3* 34.3*  MCV 88.1 88.8 89.7 86.2  PLT 283 270 250 563   Basic Metabolic Panel:  Recent Labs Lab 06/04/16 1706 06/05/16 0448 06/06/16 0404 06/07/16 0438  NA 137 140 140 136  K 4.7 4.6 4.3 4.6  CL 104 105 105 103  CO2 26 29 28 27   GLUCOSE 194* 128* 124* 169*  BUN 31* 27* 30* 37*  CREATININE 1.20 1.22 1.36* 1.38*  CALCIUM 9.1 9.1 8.7* 8.8*  MG  --  1.8  --   --   PHOS  --  3.2  --   --    GFR: Estimated Creatinine Clearance: 54.8 mL/min (by C-G formula based on SCr of 1.38 mg/dL (H)). Liver Function Tests:  Recent Labs Lab 06/04/16 1706 06/05/16 0448  AST 20 18  ALT 15* 14*  ALKPHOS 80 84  BILITOT 0.5 0.5  PROT 6.9 6.8  ALBUMIN 3.6 3.7   No results for input(s): LIPASE, AMYLASE in the last 168 hours. No results for input(s): AMMONIA in the last 168 hours. Coagulation Profile: No results for input(s): INR, PROTIME in the last 168 hours. Cardiac Enzymes: No results for input(s): CKTOTAL, CKMB, CKMBINDEX, TROPONINI in the last 168 hours. BNP (last 3 results) No results for input(s): PROBNP in the last 8760 hours. HbA1C:  Recent Labs  06/05/16 0448  HGBA1C 8.4*   CBG:  Recent Labs Lab 06/06/16 0738 06/06/16 1233 06/06/16 1642 06/06/16 2152 06/07/16 0719  GLUCAP 91 130* 112* 196* 141*   Lipid Profile: No results for input(s): CHOL, HDL, LDLCALC, TRIG, CHOLHDL, LDLDIRECT in the last 72 hours. Thyroid Function Tests:  Recent Labs  06/05/16 0448  TSH 1.252   Anemia Panel: No results for input(s): VITAMINB12, FOLATE, FERRITIN, TIBC, IRON, RETICCTPCT in the last 72 hours. Urine analysis:    Component Value Date/Time   COLORURINE AMBER (A) 06/05/2016 1008   APPEARANCEUR CLEAR 06/05/2016 1008   LABSPEC 1.030 06/05/2016 1008   PHURINE 5.5 06/05/2016 1008   GLUCOSEU NEGATIVE 06/05/2016 1008   HGBUR NEGATIVE 06/05/2016 1008   BILIRUBINUR NEGATIVE 06/05/2016 1008   KETONESUR NEGATIVE 06/05/2016 1008   PROTEINUR NEGATIVE  06/05/2016 1008   UROBILINOGEN 1.0 11/22/2012 1633   NITRITE NEGATIVE 06/05/2016 1008   LEUKOCYTESUR TRACE (A) 06/05/2016 1008   Sepsis Labs: @LABRCNTIP (procalcitonin:4,lacticidven:4)   ) Recent Results (from the past 240 hour(s))  Urine culture     Status: Abnormal   Collection Time: 06/05/16 10:08 AM  Result Value Ref Range Status   Specimen Description URINE, RANDOM  Final   Special Requests NONE  Final   Culture MULTIPLE SPECIES PRESENT, SUGGEST RECOLLECTION (A)  Final   Report Status 06/07/2016 FINAL  Final      Radiology Studies: Dg Chest 2  View Result Date: 06/04/2016 1. Question small left apical pneumothorax as above, favored to be artifactual on this exam due to technique (lordotic nature of the film and rotation of the patient). A repeat AP/PA radiograph with improved positioning is recommended for further evaluation. 2. Shallow lung inflation with mild bibasilar atelectasis. No other active cardiopulmonary disease. Electronically Signed   By: Jeannine Boga M.D.   On: 06/04/2016 17:33   Dg Ankle Complete Right Result Date: 06/04/2016  1. Acute fracture of the medial malleolus with 2-3 mm of medial displacement. 2. Possible acute nondisplaced fracture of the distal fibula, at the level of the upper fixation plate. 3. No other evidence of a new acute fracture.  No dislocation. Electronically Signed   By: Lajean Manes M.D.   On: 06/04/2016 15:09   Dg Chest Port 1 View Result Date: 06/05/2016 1. No evidence of a pneumothorax on the current exam. 2. No acute cardiopulmonary disease. Electronically Signed   By: Lajean Manes M.D.   On: 06/05/2016 09:30     Scheduled Meds: . bisacodyl  10 mg Rectal Once  . carvedilol  3.125 mg Oral BID WC  . insulin aspart  0-15 Units Subcutaneous TID WC  . insulin aspart  0-5 Units Subcutaneous QHS  . insulin glargine  30 Units Subcutaneous QHS  . isosorbide mononitrate  30 mg Oral Daily  . QUEtiapine  50 mg Oral QHS  . senna  1  tablet Oral BID  . simvastatin  40 mg Oral QPM   Continuous Infusions:    LOS: 3 days    Time spent: 25 minutes  Greater than 50% of the time spent on counseling and coordinating the care.   [name], MD [facility] Pager [phone]  If 7PM-7AM, please contact night-coverage [email or web address] Password [email or web address] 06/07/2016, 9:49 AM

## 2016-06-07 NOTE — Discharge Instructions (Signed)
Orthopaedic discharge instructions  Nonweightbearing right leg Ice and elevate right leg Elevate leg above heart level as much as possible

## 2016-06-07 NOTE — Care Management Note (Signed)
Case Management Note  Patient Details  Name: [name] MRN: [id number] Date of Birth: [birth year]  Subjective/Objective:                  Mechanical fall / acute fracture of the medial malleolus with displacement Action/Plan: Discharge planning Expected Discharge Date:  06/06/16               Expected Discharge Plan:  [place]  In-House Referral:     Discharge planning Services  CM Consult  Post Acute Care Choice:    Choice offered to:     DME Arranged:    DME Agency:     HH Arranged:    [place] Agency:     Status of Service:  Completed, signed off  If discussed at [employer] of Stay Meetings, dates discussed:    Additional Comments: CM notes pt to go back to SNF to await surgery in approx 10 days (for swelling to subside).  CM has called CSW and placed consult. Dellie Catholic, RN 06/07/2016, 1:25 PM

## 2016-06-07 NOTE — Progress Notes (Signed)
PT Cancellation Note  Patient Details Name: [name] MRN: [id number] DOB: 09-15-1931   Cancelled Treatment:    Reason Eval/Treat Not Completed: Pain limiting ability to participate . Patient adamantly declines siting the pain is too great.   Claretha Cooper 06/07/2016, 4:25 PM Tresa Endo PT [phone]

## 2016-06-07 NOTE — Clinical Social Work Note (Signed)
CSW met with patient and his wife at bedside.  Patient and his wife want [facility] for rehab choice.  CSW sent information through the [place].  Possible discharge tomorrow.  Dede Query, [place] Worker - Weekend Coverage cell #: [phone]

## 2016-06-08 DIAGNOSIS — E1121 Type 2 diabetes mellitus with diabetic nephropathy: Secondary | ICD-10-CM | POA: Diagnosis present

## 2016-06-08 DIAGNOSIS — Z9989 Dependence on other enabling machines and devices: Secondary | ICD-10-CM | POA: Diagnosis not present

## 2016-06-08 DIAGNOSIS — E1122 Type 2 diabetes mellitus with diabetic chronic kidney disease: Secondary | ICD-10-CM | POA: Diagnosis not present

## 2016-06-08 DIAGNOSIS — S82899D Other fracture of unspecified lower leg, subsequent encounter for closed fracture with routine healing: Secondary | ICD-10-CM | POA: Diagnosis not present

## 2016-06-08 DIAGNOSIS — K4020 Bilateral inguinal hernia, without obstruction or gangrene, not specified as recurrent: Secondary | ICD-10-CM | POA: Diagnosis not present

## 2016-06-08 DIAGNOSIS — R2689 Other abnormalities of gait and mobility: Secondary | ICD-10-CM | POA: Diagnosis not present

## 2016-06-08 DIAGNOSIS — F322 Major depressive disorder, single episode, severe without psychotic features: Secondary | ICD-10-CM | POA: Diagnosis not present

## 2016-06-08 DIAGNOSIS — A4181 Sepsis due to Enterococcus: Secondary | ICD-10-CM | POA: Diagnosis not present

## 2016-06-08 DIAGNOSIS — N183 Chronic kidney disease, stage 3 (moderate): Secondary | ICD-10-CM | POA: Diagnosis not present

## 2016-06-08 DIAGNOSIS — M25571 Pain in right ankle and joints of right foot: Secondary | ICD-10-CM | POA: Diagnosis not present

## 2016-06-08 DIAGNOSIS — S82891A Other fracture of right lower leg, initial encounter for closed fracture: Secondary | ICD-10-CM

## 2016-06-08 DIAGNOSIS — R4182 Altered mental status, unspecified: Secondary | ICD-10-CM | POA: Diagnosis not present

## 2016-06-08 DIAGNOSIS — J189 Pneumonia, unspecified organism: Secondary | ICD-10-CM | POA: Diagnosis not present

## 2016-06-08 DIAGNOSIS — G9341 Metabolic encephalopathy: Secondary | ICD-10-CM | POA: Diagnosis not present

## 2016-06-08 DIAGNOSIS — W06XXXA Fall from bed, initial encounter: Secondary | ICD-10-CM | POA: Diagnosis not present

## 2016-06-08 DIAGNOSIS — G894 Chronic pain syndrome: Secondary | ICD-10-CM | POA: Diagnosis not present

## 2016-06-08 DIAGNOSIS — I129 Hypertensive chronic kidney disease with stage 1 through stage 4 chronic kidney disease, or unspecified chronic kidney disease: Secondary | ICD-10-CM | POA: Diagnosis present

## 2016-06-08 DIAGNOSIS — F0390 Unspecified dementia without behavioral disturbance: Secondary | ICD-10-CM | POA: Diagnosis not present

## 2016-06-08 DIAGNOSIS — A419 Sepsis, unspecified organism: Secondary | ICD-10-CM | POA: Diagnosis not present

## 2016-06-08 DIAGNOSIS — K219 Gastro-esophageal reflux disease without esophagitis: Secondary | ICD-10-CM | POA: Diagnosis present

## 2016-06-08 DIAGNOSIS — S82841A Displaced bimalleolar fracture of right lower leg, initial encounter for closed fracture: Secondary | ICD-10-CM | POA: Diagnosis not present

## 2016-06-08 DIAGNOSIS — F4310 Post-traumatic stress disorder, unspecified: Secondary | ICD-10-CM | POA: Diagnosis present

## 2016-06-08 DIAGNOSIS — I248 Other forms of acute ischemic heart disease: Secondary | ICD-10-CM | POA: Diagnosis not present

## 2016-06-08 DIAGNOSIS — G4733 Obstructive sleep apnea (adult) (pediatric): Secondary | ICD-10-CM | POA: Diagnosis present

## 2016-06-08 DIAGNOSIS — Z794 Long term (current) use of insulin: Secondary | ICD-10-CM | POA: Diagnosis not present

## 2016-06-08 DIAGNOSIS — G5601 Carpal tunnel syndrome, right upper limb: Secondary | ICD-10-CM | POA: Diagnosis not present

## 2016-06-08 DIAGNOSIS — S82841D Displaced bimalleolar fracture of right lower leg, subsequent encounter for closed fracture with routine healing: Secondary | ICD-10-CM | POA: Diagnosis not present

## 2016-06-08 DIAGNOSIS — I82401 Acute embolism and thrombosis of unspecified deep veins of right lower extremity: Secondary | ICD-10-CM | POA: Diagnosis not present

## 2016-06-08 DIAGNOSIS — Z6841 Body Mass Index (BMI) 40.0 and over, adult: Secondary | ICD-10-CM | POA: Diagnosis not present

## 2016-06-08 DIAGNOSIS — S8251XD Displaced fracture of medial malleolus of right tibia, subsequent encounter for closed fracture with routine healing: Secondary | ICD-10-CM | POA: Diagnosis not present

## 2016-06-08 DIAGNOSIS — E860 Dehydration: Secondary | ICD-10-CM | POA: Diagnosis present

## 2016-06-08 DIAGNOSIS — E1140 Type 2 diabetes mellitus with diabetic neuropathy, unspecified: Secondary | ICD-10-CM | POA: Diagnosis not present

## 2016-06-08 DIAGNOSIS — S92909A Unspecified fracture of unspecified foot, initial encounter for closed fracture: Secondary | ICD-10-CM | POA: Diagnosis not present

## 2016-06-08 DIAGNOSIS — S82401D Unspecified fracture of shaft of right fibula, subsequent encounter for closed fracture with routine healing: Secondary | ICD-10-CM | POA: Diagnosis not present

## 2016-06-08 DIAGNOSIS — A491 Streptococcal infection, unspecified site: Secondary | ICD-10-CM | POA: Diagnosis not present

## 2016-06-08 DIAGNOSIS — K810 Acute cholecystitis: Secondary | ICD-10-CM | POA: Diagnosis not present

## 2016-06-08 DIAGNOSIS — F17220 Nicotine dependence, chewing tobacco, uncomplicated: Secondary | ICD-10-CM | POA: Diagnosis present

## 2016-06-08 DIAGNOSIS — R509 Fever, unspecified: Secondary | ICD-10-CM | POA: Diagnosis not present

## 2016-06-08 DIAGNOSIS — W19XXXD Unspecified fall, subsequent encounter: Secondary | ICD-10-CM | POA: Diagnosis not present

## 2016-06-08 DIAGNOSIS — I2699 Other pulmonary embolism without acute cor pulmonale: Secondary | ICD-10-CM | POA: Diagnosis not present

## 2016-06-08 DIAGNOSIS — E7800 Pure hypercholesterolemia, unspecified: Secondary | ICD-10-CM | POA: Diagnosis present

## 2016-06-08 DIAGNOSIS — I10 Essential (primary) hypertension: Secondary | ICD-10-CM | POA: Diagnosis not present

## 2016-06-08 DIAGNOSIS — Y92230 Patient room in hospital as the place of occurrence of the external cause: Secondary | ICD-10-CM | POA: Diagnosis not present

## 2016-06-08 DIAGNOSIS — G8929 Other chronic pain: Secondary | ICD-10-CM | POA: Diagnosis present

## 2016-06-08 DIAGNOSIS — R05 Cough: Secondary | ICD-10-CM | POA: Diagnosis not present

## 2016-06-08 DIAGNOSIS — E871 Hypo-osmolality and hyponatremia: Secondary | ICD-10-CM | POA: Diagnosis not present

## 2016-06-08 DIAGNOSIS — G5602 Carpal tunnel syndrome, left upper limb: Secondary | ICD-10-CM | POA: Diagnosis not present

## 2016-06-08 DIAGNOSIS — M6281 Muscle weakness (generalized): Secondary | ICD-10-CM | POA: Diagnosis not present

## 2016-06-08 DIAGNOSIS — R7989 Other specified abnormal findings of blood chemistry: Secondary | ICD-10-CM | POA: Diagnosis not present

## 2016-06-08 DIAGNOSIS — E872 Acidosis: Secondary | ICD-10-CM | POA: Diagnosis not present

## 2016-06-08 DIAGNOSIS — M1712 Unilateral primary osteoarthritis, left knee: Secondary | ICD-10-CM | POA: Diagnosis not present

## 2016-06-08 DIAGNOSIS — E119 Type 2 diabetes mellitus without complications: Secondary | ICD-10-CM | POA: Diagnosis not present

## 2016-06-08 DIAGNOSIS — J9601 Acute respiratory failure with hypoxia: Secondary | ICD-10-CM | POA: Diagnosis not present

## 2016-06-08 LAB — BASIC METABOLIC PANEL
Anion gap: 9 (ref 5–15)
BUN: 32 mg/dL — ABNORMAL HIGH (ref 6–20)
CHLORIDE: 101 mmol/L (ref 101–111)
CO2: 29 mmol/L (ref 22–32)
CREATININE: 1.25 mg/dL — AB (ref 0.61–1.24)
Calcium: 8.9 mg/dL (ref 8.9–10.3)
GFR calc non Af Amer: 52 mL/min — ABNORMAL LOW (ref >60)
Glucose, Bld: 153 mg/dL — ABNORMAL HIGH (ref 65–99)
POTASSIUM: 4.7 mmol/L (ref 3.5–5.1)
SODIUM: 139 mmol/L (ref 135–145)

## 2016-06-08 LAB — CBC
HEMATOCRIT: 35.4 % — AB (ref 39.0–52.0)
HEMOGLOBIN: 11.6 g/dL — AB (ref 13.0–17.0)
MCH: 29 pg (ref 26.0–34.0)
MCHC: 32.8 g/dL (ref 30.0–36.0)
MCV: 88.5 fL (ref 78.0–100.0)
Platelets: 259 10*3/uL (ref 150–400)
RBC: 4 MIL/uL — AB (ref 4.22–5.81)
RDW: 13.8 % (ref 11.5–15.5)
WBC: 11.5 10*3/uL — ABNORMAL HIGH (ref 4.0–10.5)

## 2016-06-08 LAB — GLUCOSE, CAPILLARY
GLUCOSE-CAPILLARY: 147 mg/dL — AB (ref 65–99)
Glucose-Capillary: 121 mg/dL — ABNORMAL HIGH (ref 65–99)

## 2016-06-08 MED ORDER — INSULIN GLARGINE 100 UNIT/ML ~~LOC~~ SOLN: 30.0000 [IU] | Freq: Every day | SUBCUTANEOUS | 11 refills | Status: DC

## 2016-06-08 MED ORDER — HYDROMORPHONE HCL 2 MG PO TABS: 2.0000 mg | ORAL_TABLET | ORAL | 0 refills | Status: DC | PRN

## 2016-06-08 NOTE — Progress Notes (Signed)
Attempted to call report left on hold... Bethann Punches RN

## 2016-06-08 NOTE — Clinical Social Work Note (Signed)
CSW called [facility] for patient transport to [facility]. Patient going into room 113  .Dede Query, LCSW [facility] Clinical Social Worker - cell #: [phone]

## 2016-06-08 NOTE — Discharge Summary (Addendum)
Physician Discharge Summary  [name] AQT:[id number] DOB: Jan 07, 1932 DOA: 06/04/2016  PCP: Stephens Shire, MD  Admit date: 06/04/2016 Discharge date: 06/08/2016  Time spent: > 35 minutes  Recommendations for Outpatient Follow-up:  Please ensure patient follows up with orthopedic surgeon as directed by ortho team in house.   Discharge Diagnoses:  Active Problems:   Diabetes ([place])   Essential hypertension, benign   OSA on CPAP   Chronic pain syndrome   Morbid obesity due to excess calories ([place])   CKD stage 3 due to type 2 diabetes mellitus ([place])   Type 2 diabetes with nephropathy (HCC)   Fracture, ankle   Ankle fracture   Discharge Condition: Stable  Diet recommendation: Diabetic diet  Filed Weights   06/04/16 1845  Weight: 121.6 kg (268 lb)    History of present illness:  82 year old male with a past medical history significant for obstructive sleep apnea, hypertension, chronic kidney disease stage III, diabetes who presented to [facility] long hospital status post mechanical fall and has subsequently sustained right ankle fracture. Orthopedic surgery has seen the patient in consultation.  Hospital Course:  Mechanical fall / acute fracture of the medial malleolus with displacement - Patient sustained acute fracture of the medial malleolus with 2-3 mm medial displacement and possible acute nondisplaced fracture of the distal fibula; status post mechanical fall - Per orthopedic surgery, recommendation for discharge to skilled nursing facility and delay ORIF due to swelling they will reassess patient in about 10 days. Please call their office to assure follow-up appointment date and time. - NWBR on right leg  - Discharged on oral Dilaudid    Uncontrolled diabetes mellitus with diabetic nephropathy with long-term insulin use -We'll discharge on Lantus and short-acting insulin - Diabetic diet - Recommend continued monitoring of blood sugars and adjustment of hypoglycemic  agents by primary care physician at facility    Essential hypertension, benign - Continue carvedilol 3.125 mg twice daily, isosorbide mononitrate 30 mg daily - Hold lisinopril on discharge secondary to elevated serum creatinine and low normal blood pressures.    Dyslipidemia associated without 2 diabetes mellitus - Continue simvastatin 40 mg at bedtime    OSA on CPAP - Stable respiratory status    Morbid obesity due to excess calories (HCC) - Body mass index is 37.38 kg/m - Counseled on nutrition, diet    CKD stage 3 due to type 2 diabetes mellitus (HCC) - Baseline creatinine 1.4 about 2 weeks ago - Creatinine within baseline range    Depression - Continue Seroquel    Leukocytosis - Unclear etiology - No evidence of acute infectious process, suspect secondary to recent fracture   Procedures:  None  Consultations:  Orthopedic surgery  Discharge Exam: Vitals:   06/07/16 2214 06/08/16 0629  BP: 128/64 101/86  Pulse: 85 91  Resp: 20 20  Temp: 99.2 F (37.3 C) 97.5 F (36.4 C)    General: Patient in no acute distress, alert and awake Cardiovascular: Regular rate and rhythm, no murmurs Respiratory: No increased work of breathing, no wheezes  Discharge Instructions   Discharge Instructions    Call MD for:  redness, tenderness, or signs of infection (pain, swelling, redness, odor or green/yellow discharge around incision site)    Complete by:  As directed    Call MD for:  severe uncontrolled pain    Complete by:  As directed    Call MD for:  temperature >100.4    Complete by:  As directed    Diet -  low sodium heart healthy    Complete by:  As directed    Increase activity slowly    Complete by:  As directed      Current Discharge Medication List    START taking these medications   Details  HYDROmorphone (DILAUDID) 2 MG tablet Take 1 tablet (2 mg total) by mouth every 3 (three) hours as needed for severe pain. Qty: 30 tablet, Refills: 0       CONTINUE these medications which have CHANGED   Details  insulin glargine (LANTUS) 100 UNIT/ML injection Inject 0.3 mLs (30 Units total) into the skin at bedtime. Qty: 10 mL, Refills: 11      CONTINUE these medications which have NOT CHANGED   Details  carvedilol (COREG) 3.125 MG tablet Take 3.125 mg by mouth 2 (two) times daily with a meal.    dextran 70-hypromellose (TEARS RENEWED) ophthalmic solution Place 1 drop into both eyes 2 (two) times daily.    Ergocalciferol (VITAMIN D2) 2000 UNITS TABS Take 2,000 Units by mouth 2 (two) times daily.     galantamine (RAZADYNE ER) 16 MG 24 hr capsule Take 16 mg by mouth daily with breakfast.    insulin aspart (NOVOLOG) 100 UNIT/ML injection Inject 0-15 Units into the skin 3 (three) times daily with meals. Per sensitive sliding scale Qty: 1 vial, Refills: 0    isosorbide mononitrate (IMDUR) 30 MG 24 hr tablet Take 30 mg by mouth every morning.     magnesium oxide (MAG-OX) 400 MG tablet Take 400 mg by mouth 2 (two) times daily.    methocarbamol (ROBAXIN) 750 MG tablet Take 1 tablet by mouth every 6 (six) hours as needed for muscle spasms.     Multiple Vitamins-Minerals (PRESERVISION AREDS 2 PO) Take 1 capsule by mouth 2 (two) times daily.    pantoprazole (PROTONIX) 40 MG tablet Take 1 tablet (40 mg total) by mouth daily. Qty: 90 tablet, Refills: 1    QUEtiapine (SEROQUEL) 50 MG tablet Take 50 mg by mouth at bedtime.    simvastatin (ZOCOR) 40 MG tablet Take 40 mg by mouth every evening.    vitamin B-12 (CYANOCOBALAMIN) 1000 MCG tablet Take 500 mcg by mouth 2 (two) times daily.     docusate sodium (COLACE) 100 MG capsule Take 1 capsule (100 mg total) by mouth 2 (two) times daily. While taking narcotic pain medicine. Qty: 30 capsule, Refills: 0    tamsulosin (FLOMAX) 0.4 MG CAPS capsule Take 1 capsule (0.4 mg total) by mouth daily after supper. Qty: 30 capsule, Refills: 0      STOP taking these medications     lisinopril  (PRINIVIL,ZESTRIL) 20 MG tablet      phenazopyridine (PYRIDIUM) 100 MG tablet        No Known Allergies Follow-up Information    HEWITT, JOHN, MD. Schedule an appointment as soon as possible for a visit in 3 day(s).   Specialty:  Orthopedic Surgery Why:  make front desk aware that this is a pre-op evaluation to check soft tissues before surgery  Contact information: [address] [place] [address] [place] [address] [phone]            The results of significant diagnostics from this hospitalization (including imaging, microbiology, ancillary and laboratory) are listed below for reference.    Significant Diagnostic Studies: Dg Chest 2 View  Result Date: 06/04/2016 CLINICAL DATA:  Initial evaluation for acute chest pain. History of diabetes, hypertension. EXAM: CHEST  2 VIEW COMPARISON:  Prior radiograph from 05/23/2016.  FINDINGS: Examination is somewhat technically limited by lordotic angulation. Patient is also rotated to the right. Allowing for positioning, cardiac and mediastinal silhouettes grossly stable. Lungs are mildly hypoinflated. Mild bibasilar atelectatic changes. No definite focal infiltrates. No pulmonary edema or pleural effusion. There is question of a small pleural edge at the left lung apex, favored to be artifactual on this exam due to technique, although a small apical pneumothorax is not entirely excluded. No acute osseous abnormality. Multilevel degenerative changes noted within the visualized spine. IMPRESSION: 1. Question small left apical pneumothorax as above, favored to be artifactual on this exam due to technique (lordotic nature of the film and rotation of the patient). A repeat AP/PA radiograph with improved positioning is recommended for further evaluation. 2. Shallow lung inflation with mild bibasilar atelectasis. No other active cardiopulmonary disease. Electronically Signed   By: Jeannine Boga M.D.   On: 06/04/2016 17:33   Dg Chest 2  View  Result Date: 05/23/2016 CLINICAL DATA:  Left chest pain for 1 day EXAM: CHEST  2 VIEW COMPARISON:  February 11, 2016 FINDINGS: The heart size and mediastinal contours are within normal limits. The lung volumes are low. There is probable minimal atelectasis of bilateral lung bases. There is no focal pneumonia, pulmonary edema, or pleural effusion. The visualized skeletal structures are stable. IMPRESSION: Probable minimal atelectasis of bilateral lung bases. Otherwise no acute abnormality. Electronically Signed   By: Abelardo Diesel M.D.   On: 05/23/2016 21:36   Dg Ankle Complete Right  Result Date: 06/04/2016 CLINICAL DATA:  Fall this a.m. while putting pants on and injured right ankle. States his foot "bent underneath him". H/o right distal fibula fracture x approx. 20 years ago per patient. Also h/o right ORIF calcaneous surgery last year per chart. EXAM: RIGHT ANKLE - COMPLETE 3+ VIEW COMPARISON:  CT, 02/12/2015 FINDINGS: There is acute fracture of the medial malleolus, obliquely across its base, minimally displaced medially by 2-3 mm. No comminution. Hip fracture line projects along the posterior margin of the distal fibula adjacent to the second proximal screw of the lateral fibular fixation plate. This fracture line was not present on the prior CT. No other evidence of a fracture. The ankle mortise is normally spaced and aligned. Two screws have been inserted across the calcaneal tuberosity. There is diffuse soft tissue swelling. IMPRESSION: 1. Acute fracture of the medial malleolus with 2-3 mm of medial displacement. 2. Possible acute nondisplaced fracture of the distal fibula, at the level of the upper fixation plate. 3. No other evidence of a new acute fracture.  No dislocation. Electronically Signed   By: Lajean Manes M.D.   On: 06/04/2016 15:09   Ct Ankle Right Wo Contrast  Result Date: 06/06/2016 CLINICAL DATA:  Status post fall 06/04/2016 with of right ankle fracture. Pain. History of open  reduction internal fixation of ankle fracture 20 years ago. EXAM: CT OF THE RIGHT ANKLE WITHOUT CONTRAST TECHNIQUE: Multidetector CT imaging of the right ankle was performed according to the standard protocol. Multiplanar CT image reconstructions were also generated. COMPARISON:  None. FINDINGS: Bones/Joint/Cartilage Interfragmentary screw and plate and screw fixation of a remote distal fibular fracture is identified. The patient has an acute fracture of the fibula. The fracture originates in the anterior cortex just below the most superior screw and extends distally to approximately the level of the interfragmentary screw. The fracture has a transverse component between the superior most screws. This fracture is essentially nondisplaced. There is an acute fracture of the anterior,  lateral tibial plafond. The fracture fragment measures 1.9 cm AP by approximately 1 cm transverse at the plafond by 2.4 cm craniocaudal. This fracture fragment is laterally displaced up to 1.1 cm. There is also fracture of the medial malleolus with approximately 0.6 cm lateral displacement of the fracture fragment. Finally, a nondisplaced fracture of the lateral corner of the posterior malleolus includes a fragment which measures 1.2 cm transverse by 0.9 cm AP by up to 1.1 cm craniocaudal. No other fracture is identified. Two screws in the calcaneus are likely from prior healed calcaneal osteotomy. Bones are somewhat osteopenic. Ligaments Suboptimally assessed by CT. Muscles and Tendons No tendon entrapment is identified. Imaged musculature appears atrophic. Soft tissues Swelling and hematoma are present about the patient's ankle. IMPRESSION: Fractures of the distal fibula, medial malleolus, lateral corner of the posterior malleolus and anterior corner of the lateral plafond as described above. Electronically Signed   By: Inge Rise M.D.   On: 06/06/2016 19:30   Dg Chest Port 1 View  Result Date: 06/05/2016 CLINICAL DATA:  Short  of breath today. Follow-up for abnormal chest radiograph. EXAM: PORTABLE CHEST 1 VIEW COMPARISON:  06/04/2016 FINDINGS: Tiny left apical pneumothorax suggested on the previous exam is not evident on the current study. Cardiac silhouette is mildly enlarged. No convincing mediastinal or hilar masses. Lung volumes remain low.  Lungs are clear. IMPRESSION: 1. No evidence of a pneumothorax on the current exam. 2. No acute cardiopulmonary disease. Electronically Signed   By: Lajean Manes M.D.   On: 06/05/2016 09:30    Microbiology: Recent Results (from the past 240 hour(s))  Urine culture     Status: Abnormal   Collection Time: 06/05/16 10:08 AM  Result Value Ref Range Status   Specimen Description URINE, RANDOM  Final   Special Requests NONE  Final   Culture MULTIPLE SPECIES PRESENT, SUGGEST RECOLLECTION (A)  Final   Report Status 06/07/2016 FINAL  Final     Labs: Basic Metabolic Panel:  Recent Labs Lab 06/04/16 1706 06/05/16 0448 06/06/16 0404 06/07/16 0438 06/08/16 0451  NA 137 140 140 136 139  K 4.7 4.6 4.3 4.6 4.7  CL 104 105 105 103 101  CO2 26 29 28 27 29   GLUCOSE 194* 128* 124* 169* 153*  BUN 31* 27* 30* 37* 32*  CREATININE 1.20 1.22 1.36* 1.38* 1.25*  CALCIUM 9.1 9.1 8.7* 8.8* 8.9  MG  --  1.8  --   --   --   PHOS  --  3.2  --   --   --    Liver Function Tests:  Recent Labs Lab 06/04/16 1706 06/05/16 0448  AST 20 18  ALT 15* 14*  ALKPHOS 80 84  BILITOT 0.5 0.5  PROT 6.9 6.8  ALBUMIN 3.6 3.7   No results for input(s): LIPASE, AMYLASE in the last 168 hours. No results for input(s): AMMONIA in the last 168 hours. CBC:  Recent Labs Lab 06/04/16 1706 06/05/16 0448 06/06/16 0404 06/07/16 0438 06/08/16 0451  WBC 19.6* 12.9* 13.1* 11.3* 11.5*  NEUTROABS 14.3*  --   --   --   --   HGB 13.6 13.1 11.9* 11.2* 11.6*  HCT 41.3 40.6 37.3* 34.3* 35.4*  MCV 88.1 88.8 89.7 86.2 88.5  PLT 283 270 250 236 259   Cardiac Enzymes: No results for input(s): CKTOTAL,  CKMB, CKMBINDEX, TROPONINI in the last 168 hours. BNP: BNP (last 3 results)  Recent Labs  02/11/16 2033  BNP 131.7*  ProBNP (last 3 results) No results for input(s): PROBNP in the last 8760 hours.  CBG:  Recent Labs Lab 06/07/16 0719 06/07/16 1707 06/07/16 2213 06/08/16 0743 06/08/16 1207  GLUCAP 141* 163* 166* 121* 147*    Signed:  Velvet Bathe MD.  [facility] 06/08/2016, 12:23 PM

## 2016-06-08 NOTE — Clinical Social Work Note (Signed)
Patient is accepted at [facility] for rehab.  Discharge today.  Patient's wife will meet with facility at 1:30 to sign paperwork.  Patient may discharge after that time.  Dede Query, [place] Worker - cell #: [phone]

## 2016-06-08 NOTE — Care Management Important Message (Signed)
Important Message  Patient Details  Name: [name] MRN: [id number] Date of Birth: 1931-12-02   Medicare Important Message Given:  Yes    Camillo Flaming 06/08/2016, 9:58 AMImportant Message  Patient Details  Name: [name] MRN: [id number] Date of Birth: 09-17-1931   Medicare Important Message Given:  Yes    Camillo Flaming 06/08/2016, 9:58 AM

## 2016-06-08 NOTE — NC FL2 (Signed)
[place] LEVEL OF CARE SCREENING TOOL     IDENTIFICATION  Patient Name: [name] Birthdate: May 16, 1932 Sex: male Admission Date (Current Location): 06/04/2016  [facility] and [place] Number:  Herbalist and Address:  [facility],  [place] [address] [place]      Provider Number: [id number]  Attending Physician Name and Address:  Velvet Bathe, MD  Relative Name and Phone Number:       Current Level of Care: Hospital Recommended Level of Care: [place] Prior Approval Number:    Date Approved/Denied:   PASRR Number: [id number]  Discharge Plan: SNF    Current Diagnoses: Patient Active Problem List   Diagnosis Date Noted  . Fracture, ankle 06/04/2016  . Ankle fracture 06/04/2016  . Urinary retention   . Type 2 diabetes with nephropathy ([place])   . Pneumobilia 02/13/2016  . Acute pyelonephritis: Probable 02/13/2016  . UTI (lower urinary tract infection) 02/11/2016  . CKD stage 3 due to type 2 diabetes mellitus ([place]) 02/11/2016  . OSA on CPAP 11/30/2015  . Chronic post-traumatic stress disorder (PTSD) 11/30/2015  . Chronic pain syndrome 11/30/2015  . Morbid obesity due to excess calories ([place]) 11/30/2015  . Nocturia more than twice per night 11/30/2015  . Closed displaced bimalleolar fracture of right ankle 02/20/2015  . Bimalleolar ankle fracture 02/20/2015  . Duodenal ulcer, perforated s/p repair/omental patch 11/26/2012  . Helicobacter pylori gastritis 11/26/2012  . Lumbosacral radiculopathy at L5 11/12/2012  . Herniated lumbar intervertebral disc 11/12/2012  . Hyperglycemia 11/12/2012  . Essential hypertension, benign 11/12/2012  . Unspecified constipation 11/12/2012  . Diabetes ([place]) 11/09/2012  . History of back surgery 11/09/2012  . Obesity 11/09/2012    Orientation RESPIRATION BLADDER Height & Weight     Self, Time, Situation, Place  Normal Continent Weight: 268 lb (121.6 kg) Height:  5'  11" (180.3 cm)  BEHAVIORAL SYMPTOMS/MOOD NEUROLOGICAL BOWEL NUTRITION STATUS      Continent Diet (carb modified)  AMBULATORY STATUS COMMUNICATION OF NEEDS Skin   Extensive Assist Verbally Normal                       Personal Care Assistance Level of Assistance  Bathing, Dressing Bathing Assistance: Maximum assistance   Dressing Assistance: Maximum assistance     Functional Limitations Info  Sight, Hearing Sight Info: Impaired Hearing Info: Impaired (hearing aids in both ears)      SPECIAL CARE FACTORS FREQUENCY  PT (By licensed PT), OT (By licensed OT)     PT Frequency: 5 times week OT Frequency: 5 times week            Contractures      Additional Factors Info  Code Status, Allergies Code Status Info: DNR Allergies Info: no known allergies           Current Medications (06/08/2016):  This is the current hospital active medication list Current Facility-Administered Medications  Medication Dose Route Frequency Provider Last Rate Last Dose  . acetaminophen (TYLENOL) tablet 650 mg  650 mg Oral Q6H PRN Toy Baker, MD   650 mg at 06/05/16 0609   Or  . acetaminophen (TYLENOL) suppository 650 mg  650 mg Rectal Q6H PRN Toy Baker, MD      . bisacodyl (DULCOLAX) suppository 10 mg  10 mg Rectal Once [name], MD      . carvedilol (COREG) tablet 3.125 mg  3.125 mg Oral BID WC Toy Baker, MD  3.125 mg at 06/08/16 0759  . HYDROmorphone (DILAUDID) injection 1 mg  1 mg Intravenous Q3H PRN [name], MD   1 mg at 06/08/16 0759  . HYDROmorphone (DILAUDID) tablet 2 mg  2 mg Oral Q3H PRN [name], MD   2 mg at 06/08/16 0636  . insulin aspart (novoLOG) injection 0-15 Units  0-15 Units Subcutaneous TID WC Toy Baker, MD   3 Units at 06/07/16 1812  . insulin aspart (novoLOG) injection 0-5 Units  0-5 Units Subcutaneous QHS Toy Baker, MD   2 Units at 06/05/16 2117  . insulin glargine (LANTUS) injection 30 Units  30 Units  Subcutaneous QHS Toy Baker, MD   30 Units at 06/07/16 2226  . isosorbide mononitrate (IMDUR) 24 hr tablet 30 mg  30 mg Oral Daily Toy Baker, MD   30 mg at 06/07/16 1000  . methocarbamol (ROBAXIN) tablet 750 mg  750 mg Oral Q6H PRN Toy Baker, MD   750 mg at 06/06/16 0528  . ondansetron (ZOFRAN) tablet 4 mg  4 mg Oral Q6H PRN Toy Baker, MD       Or  . ondansetron (ZOFRAN) injection 4 mg  4 mg Intravenous Q6H PRN Toy Baker, MD      . polyethylene glycol (MIRALAX / GLYCOLAX) packet 17 g  17 g Oral Daily PRN Toy Baker, MD      . QUEtiapine (SEROQUEL) tablet 50 mg  50 mg Oral QHS Toy Baker, MD   50 mg at 06/07/16 2226  . senna (SENOKOT) tablet 8.6 mg  1 tablet Oral BID Toy Baker, MD   8.6 mg at 06/08/16 0758  . simvastatin (ZOCOR) tablet 40 mg  40 mg Oral QPM Toy Baker, MD   40 mg at 06/07/16 1811     Discharge Medications: Please see discharge summary for a list of discharge medications.  Relevant Imaging Results:  Relevant Lab Results:   Additional Information [id number]  Carlean Jews, LCSW

## 2016-06-08 NOTE — Progress Notes (Signed)
Orthopaedic follow up   Pt is to follow up with Dr. Doran Durand with [facility] in the next 3-5 days. Pt is established there. This was noted in follow up section of the discharge tab. Thanks  Jari Pigg, PA-C [facility] [phone] (P) 06/08/2016 2:08 PM

## 2016-06-09 DIAGNOSIS — W19XXXD Unspecified fall, subsequent encounter: Secondary | ICD-10-CM | POA: Diagnosis not present

## 2016-06-09 DIAGNOSIS — S8251XD Displaced fracture of medial malleolus of right tibia, subsequent encounter for closed fracture with routine healing: Secondary | ICD-10-CM | POA: Diagnosis not present

## 2016-06-09 DIAGNOSIS — E119 Type 2 diabetes mellitus without complications: Secondary | ICD-10-CM | POA: Diagnosis not present

## 2016-06-09 DIAGNOSIS — N183 Chronic kidney disease, stage 3 (moderate): Secondary | ICD-10-CM | POA: Diagnosis not present

## 2016-06-10 DIAGNOSIS — G4733 Obstructive sleep apnea (adult) (pediatric): Secondary | ICD-10-CM | POA: Diagnosis not present

## 2016-06-10 DIAGNOSIS — I10 Essential (primary) hypertension: Secondary | ICD-10-CM | POA: Diagnosis not present

## 2016-06-10 DIAGNOSIS — E1140 Type 2 diabetes mellitus with diabetic neuropathy, unspecified: Secondary | ICD-10-CM | POA: Diagnosis not present

## 2016-06-10 DIAGNOSIS — F322 Major depressive disorder, single episode, severe without psychotic features: Secondary | ICD-10-CM | POA: Diagnosis not present

## 2016-06-10 DIAGNOSIS — N183 Chronic kidney disease, stage 3 (moderate): Secondary | ICD-10-CM | POA: Diagnosis not present

## 2016-06-10 DIAGNOSIS — S82891A Other fracture of right lower leg, initial encounter for closed fracture: Secondary | ICD-10-CM | POA: Diagnosis not present

## 2016-06-14 DIAGNOSIS — G5601 Carpal tunnel syndrome, right upper limb: Secondary | ICD-10-CM | POA: Diagnosis not present

## 2016-06-14 DIAGNOSIS — M1712 Unilateral primary osteoarthritis, left knee: Secondary | ICD-10-CM | POA: Diagnosis not present

## 2016-06-14 DIAGNOSIS — M25571 Pain in right ankle and joints of right foot: Secondary | ICD-10-CM | POA: Diagnosis not present

## 2016-06-14 DIAGNOSIS — G5602 Carpal tunnel syndrome, left upper limb: Secondary | ICD-10-CM | POA: Diagnosis not present

## 2016-06-17 DIAGNOSIS — S82841A Displaced bimalleolar fracture of right lower leg, initial encounter for closed fracture: Secondary | ICD-10-CM | POA: Diagnosis not present
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
## 2016-06-22 ENCOUNTER — Encounter (HOSPITAL_BASED_OUTPATIENT_CLINIC_OR_DEPARTMENT_OTHER): Payer: Self-pay

## 2016-06-22 ENCOUNTER — Ambulatory Visit (HOSPITAL_BASED_OUTPATIENT_CLINIC_OR_DEPARTMENT_OTHER): Admit: 2016-06-22 | Payer: Medicare Other | Admitting: Orthopedic Surgery

## 2016-06-22 SURGERY — BILATERAL CARPAL TUNNEL RELEASE
Anesthesia: Choice | Laterality: Bilateral

## 2016-06-30 DIAGNOSIS — W19XXXD Unspecified fall, subsequent encounter: Secondary | ICD-10-CM | POA: Diagnosis not present

## 2016-06-30 DIAGNOSIS — N183 Chronic kidney disease, stage 3 (moderate): Secondary | ICD-10-CM | POA: Diagnosis not present

## 2016-06-30 DIAGNOSIS — E119 Type 2 diabetes mellitus without complications: Secondary | ICD-10-CM | POA: Diagnosis not present

## 2016-06-30 DIAGNOSIS — S8251XD Displaced fracture of medial malleolus of right tibia, subsequent encounter for closed fracture with routine healing: Secondary | ICD-10-CM | POA: Diagnosis not present

## 2016-07-04 DIAGNOSIS — S82841D Displaced bimalleolar fracture of right lower leg, subsequent encounter for closed fracture with routine healing: Secondary | ICD-10-CM | POA: Diagnosis not present

## 2016-07-15 DIAGNOSIS — N183 Chronic kidney disease, stage 3 (moderate): Secondary | ICD-10-CM | POA: Diagnosis not present

## 2016-07-15 DIAGNOSIS — F322 Major depressive disorder, single episode, severe without psychotic features: Secondary | ICD-10-CM | POA: Diagnosis not present

## 2016-07-15 DIAGNOSIS — F0390 Unspecified dementia without behavioral disturbance: Secondary | ICD-10-CM | POA: Diagnosis not present

## 2016-07-15 DIAGNOSIS — E1140 Type 2 diabetes mellitus with diabetic neuropathy, unspecified: Secondary | ICD-10-CM | POA: Diagnosis not present

## 2016-07-15 DIAGNOSIS — S82891A Other fracture of right lower leg, initial encounter for closed fracture: Secondary | ICD-10-CM | POA: Diagnosis not present

## 2016-07-15 DIAGNOSIS — I10 Essential (primary) hypertension: Secondary | ICD-10-CM | POA: Diagnosis not present

## 2016-07-20 ENCOUNTER — Emergency Department (HOSPITAL_BASED_OUTPATIENT_CLINIC_OR_DEPARTMENT_OTHER): Payer: Medicare Other

## 2016-07-20 ENCOUNTER — Inpatient Hospital Stay (HOSPITAL_BASED_OUTPATIENT_CLINIC_OR_DEPARTMENT_OTHER)
Admission: EM | Admit: 2016-07-20 | Discharge: 2016-07-27 | DRG: 871 | Disposition: A | Discharge status: Skilled Nursing Facility | Payer: Medicare Other | Attending: Internal Medicine | Admitting: Internal Medicine

## 2016-07-20 ENCOUNTER — Encounter (HOSPITAL_BASED_OUTPATIENT_CLINIC_OR_DEPARTMENT_OTHER): Payer: Self-pay

## 2016-07-20 DIAGNOSIS — J189 Pneumonia, unspecified organism: Secondary | ICD-10-CM | POA: Diagnosis not present

## 2016-07-20 DIAGNOSIS — E876 Hypokalemia: Secondary | ICD-10-CM | POA: Diagnosis not present

## 2016-07-20 DIAGNOSIS — N183 Chronic kidney disease, stage 3 (moderate): Secondary | ICD-10-CM | POA: Diagnosis present

## 2016-07-20 DIAGNOSIS — R6520 Severe sepsis without septic shock: Secondary | ICD-10-CM | POA: Diagnosis present

## 2016-07-20 DIAGNOSIS — Z1611 Resistance to penicillins: Secondary | ICD-10-CM | POA: Diagnosis present

## 2016-07-20 DIAGNOSIS — R509 Fever, unspecified: Secondary | ICD-10-CM | POA: Diagnosis not present

## 2016-07-20 DIAGNOSIS — S82831A Other fracture of upper and lower end of right fibula, initial encounter for closed fracture: Secondary | ICD-10-CM | POA: Diagnosis not present

## 2016-07-20 DIAGNOSIS — I129 Hypertensive chronic kidney disease with stage 1 through stage 4 chronic kidney disease, or unspecified chronic kidney disease: Secondary | ICD-10-CM | POA: Diagnosis present

## 2016-07-20 DIAGNOSIS — E860 Dehydration: Secondary | ICD-10-CM | POA: Diagnosis present

## 2016-07-20 DIAGNOSIS — I248 Other forms of acute ischemic heart disease: Secondary | ICD-10-CM | POA: Diagnosis present

## 2016-07-20 DIAGNOSIS — R140 Abdominal distension (gaseous): Secondary | ICD-10-CM | POA: Diagnosis not present

## 2016-07-20 DIAGNOSIS — E872 Acidosis: Secondary | ICD-10-CM | POA: Diagnosis not present

## 2016-07-20 DIAGNOSIS — Z6841 Body Mass Index (BMI) 40.0 and over, adult: Secondary | ICD-10-CM | POA: Diagnosis not present

## 2016-07-20 DIAGNOSIS — E119 Type 2 diabetes mellitus without complications: Secondary | ICD-10-CM | POA: Diagnosis present

## 2016-07-20 DIAGNOSIS — W06XXXA Fall from bed, initial encounter: Secondary | ICD-10-CM | POA: Diagnosis not present

## 2016-07-20 DIAGNOSIS — S92909A Unspecified fracture of unspecified foot, initial encounter for closed fracture: Secondary | ICD-10-CM | POA: Diagnosis not present

## 2016-07-20 DIAGNOSIS — S82401D Unspecified fracture of shaft of right fibula, subsequent encounter for closed fracture with routine healing: Secondary | ICD-10-CM | POA: Diagnosis not present

## 2016-07-20 DIAGNOSIS — E7800 Pure hypercholesterolemia, unspecified: Secondary | ICD-10-CM | POA: Diagnosis present

## 2016-07-20 DIAGNOSIS — M62838 Other muscle spasm: Secondary | ICD-10-CM | POA: Diagnosis present

## 2016-07-20 DIAGNOSIS — Z86711 Personal history of pulmonary embolism: Secondary | ICD-10-CM | POA: Diagnosis present

## 2016-07-20 DIAGNOSIS — E871 Hypo-osmolality and hyponatremia: Secondary | ICD-10-CM | POA: Diagnosis not present

## 2016-07-20 DIAGNOSIS — A491 Streptococcal infection, unspecified site: Secondary | ICD-10-CM | POA: Diagnosis not present

## 2016-07-20 DIAGNOSIS — I2699 Other pulmonary embolism without acute cor pulmonale: Secondary | ICD-10-CM | POA: Diagnosis present

## 2016-07-20 DIAGNOSIS — R05 Cough: Secondary | ICD-10-CM | POA: Diagnosis not present

## 2016-07-20 DIAGNOSIS — I82401 Acute embolism and thrombosis of unspecified deep veins of right lower extremity: Secondary | ICD-10-CM | POA: Diagnosis present

## 2016-07-20 DIAGNOSIS — R7989 Other specified abnormal findings of blood chemistry: Secondary | ICD-10-CM

## 2016-07-20 DIAGNOSIS — G8929 Other chronic pain: Secondary | ICD-10-CM | POA: Diagnosis present

## 2016-07-20 DIAGNOSIS — S8251XA Displaced fracture of medial malleolus of right tibia, initial encounter for closed fracture: Secondary | ICD-10-CM | POA: Diagnosis not present

## 2016-07-20 DIAGNOSIS — A4181 Sepsis due to Enterococcus: Principal | ICD-10-CM | POA: Diagnosis present

## 2016-07-20 DIAGNOSIS — S8251XD Displaced fracture of medial malleolus of right tibia, subsequent encounter for closed fracture with routine healing: Secondary | ICD-10-CM | POA: Diagnosis not present

## 2016-07-20 DIAGNOSIS — K219 Gastro-esophageal reflux disease without esophagitis: Secondary | ICD-10-CM | POA: Diagnosis present

## 2016-07-20 DIAGNOSIS — Z4789 Encounter for other orthopedic aftercare: Secondary | ICD-10-CM | POA: Diagnosis not present

## 2016-07-20 DIAGNOSIS — S82401A Unspecified fracture of shaft of right fibula, initial encounter for closed fracture: Secondary | ICD-10-CM | POA: Diagnosis present

## 2016-07-20 DIAGNOSIS — F4310 Post-traumatic stress disorder, unspecified: Secondary | ICD-10-CM | POA: Diagnosis present

## 2016-07-20 DIAGNOSIS — R778 Other specified abnormalities of plasma proteins: Secondary | ICD-10-CM

## 2016-07-20 DIAGNOSIS — G9341 Metabolic encephalopathy: Secondary | ICD-10-CM | POA: Diagnosis present

## 2016-07-20 DIAGNOSIS — Z794 Long term (current) use of insulin: Secondary | ICD-10-CM

## 2016-07-20 DIAGNOSIS — R1084 Generalized abdominal pain: Secondary | ICD-10-CM

## 2016-07-20 DIAGNOSIS — Z8711 Personal history of peptic ulcer disease: Secondary | ICD-10-CM

## 2016-07-20 DIAGNOSIS — G4733 Obstructive sleep apnea (adult) (pediatric): Secondary | ICD-10-CM | POA: Diagnosis not present

## 2016-07-20 DIAGNOSIS — E1121 Type 2 diabetes mellitus with diabetic nephropathy: Secondary | ICD-10-CM | POA: Diagnosis present

## 2016-07-20 DIAGNOSIS — T148XXA Other injury of unspecified body region, initial encounter: Secondary | ICD-10-CM

## 2016-07-20 DIAGNOSIS — E669 Obesity, unspecified: Secondary | ICD-10-CM | POA: Diagnosis present

## 2016-07-20 DIAGNOSIS — A419 Sepsis, unspecified organism: Secondary | ICD-10-CM

## 2016-07-20 DIAGNOSIS — W19XXXD Unspecified fall, subsequent encounter: Secondary | ICD-10-CM | POA: Diagnosis not present

## 2016-07-20 DIAGNOSIS — L8990 Pressure ulcer of unspecified site, unspecified stage: Secondary | ICD-10-CM | POA: Diagnosis present

## 2016-07-20 DIAGNOSIS — N400 Enlarged prostate without lower urinary tract symptoms: Secondary | ICD-10-CM | POA: Diagnosis present

## 2016-07-20 DIAGNOSIS — K810 Acute cholecystitis: Secondary | ICD-10-CM | POA: Diagnosis not present

## 2016-07-20 DIAGNOSIS — K4020 Bilateral inguinal hernia, without obstruction or gangrene, not specified as recurrent: Secondary | ICD-10-CM | POA: Diagnosis not present

## 2016-07-20 DIAGNOSIS — K5900 Constipation, unspecified: Secondary | ICD-10-CM | POA: Diagnosis not present

## 2016-07-20 DIAGNOSIS — J9601 Acute respiratory failure with hypoxia: Secondary | ICD-10-CM | POA: Diagnosis not present

## 2016-07-20 DIAGNOSIS — R4182 Altered mental status, unspecified: Secondary | ICD-10-CM | POA: Diagnosis not present

## 2016-07-20 DIAGNOSIS — Y92230 Patient room in hospital as the place of occurrence of the external cause: Secondary | ICD-10-CM | POA: Diagnosis not present

## 2016-07-20 DIAGNOSIS — Z9989 Dependence on other enabling machines and devices: Secondary | ICD-10-CM

## 2016-07-20 DIAGNOSIS — R0602 Shortness of breath: Secondary | ICD-10-CM | POA: Diagnosis not present

## 2016-07-20 DIAGNOSIS — F17220 Nicotine dependence, chewing tobacco, uncomplicated: Secondary | ICD-10-CM | POA: Diagnosis present

## 2016-07-20 DIAGNOSIS — R062 Wheezing: Secondary | ICD-10-CM

## 2016-07-20 DIAGNOSIS — S82839S Other fracture of upper and lower end of unspecified fibula, sequela: Secondary | ICD-10-CM | POA: Diagnosis not present

## 2016-07-20 DIAGNOSIS — M549 Dorsalgia, unspecified: Secondary | ICD-10-CM | POA: Diagnosis present

## 2016-07-20 DIAGNOSIS — I10 Essential (primary) hypertension: Secondary | ICD-10-CM | POA: Diagnosis not present

## 2016-07-20 DIAGNOSIS — S82831D Other fracture of upper and lower end of right fibula, subsequent encounter for closed fracture with routine healing: Secondary | ICD-10-CM | POA: Diagnosis not present

## 2016-07-20 DIAGNOSIS — E1122 Type 2 diabetes mellitus with diabetic chronic kidney disease: Secondary | ICD-10-CM | POA: Diagnosis not present

## 2016-07-20 DIAGNOSIS — R0902 Hypoxemia: Secondary | ICD-10-CM

## 2016-07-20 DIAGNOSIS — S82839D Other fracture of upper and lower end of unspecified fibula, subsequent encounter for closed fracture with routine healing: Secondary | ICD-10-CM | POA: Diagnosis not present

## 2016-07-20 LAB — I-STAT VENOUS BLOOD GAS, ED
ACID-BASE EXCESS: 3 mmol/L — AB (ref 0.0–2.0)
BICARBONATE: 28.2 mmol/L — AB (ref 20.0–28.0)
O2 Saturation: 69 %
PH VEN: 7.413 (ref 7.250–7.430)
TCO2: 29 mmol/L (ref 0–100)
pCO2, Ven: 44.2 mmHg (ref 44.0–60.0)
pO2, Ven: 36 mmHg (ref 32.0–45.0)

## 2016-07-20 LAB — COMPREHENSIVE METABOLIC PANEL
ALBUMIN: 3.8 g/dL (ref 3.5–5.0)
ALT: 15 U/L — ABNORMAL LOW (ref 17–63)
ANION GAP: 13 (ref 5–15)
AST: 24 U/L (ref 15–41)
Alkaline Phosphatase: 94 U/L (ref 38–126)
BUN: 21 mg/dL — ABNORMAL HIGH (ref 6–20)
CALCIUM: 9.1 mg/dL (ref 8.9–10.3)
CO2: 23 mmol/L (ref 22–32)
Chloride: 97 mmol/L — ABNORMAL LOW (ref 101–111)
Creatinine, Ser: 1.58 mg/dL — ABNORMAL HIGH (ref 0.61–1.24)
GFR calc non Af Amer: 39 mL/min — ABNORMAL LOW (ref >60)
GFR, EST AFRICAN AMERICAN: 45 mL/min — AB (ref >60)
GLUCOSE: 255 mg/dL — AB (ref 65–99)
POTASSIUM: 4.1 mmol/L (ref 3.5–5.1)
SODIUM: 133 mmol/L — AB (ref 135–145)
TOTAL PROTEIN: 7.4 g/dL (ref 6.5–8.1)
Total Bilirubin: 0.7 mg/dL (ref 0.3–1.2)

## 2016-07-20 LAB — CBC
HEMATOCRIT: 44 % (ref 39.0–52.0)
HEMOGLOBIN: 14.5 g/dL (ref 13.0–17.0)
MCH: 28.5 pg (ref 26.0–34.0)
MCHC: 33 g/dL (ref 30.0–36.0)
MCV: 86.6 fL (ref 78.0–100.0)
Platelets: 308 10*3/uL (ref 150–400)
RBC: 5.08 MIL/uL (ref 4.22–5.81)
RDW: 14 % (ref 11.5–15.5)
WBC: 30.1 10*3/uL — ABNORMAL HIGH (ref 4.0–10.5)

## 2016-07-20 LAB — CBG MONITORING, ED: GLUCOSE-CAPILLARY: 217 mg/dL — AB (ref 65–99)

## 2016-07-20 LAB — TROPONIN I: TROPONIN I: 0.26 ng/mL — AB (ref <0.03)

## 2016-07-20 LAB — URINALYSIS, ROUTINE W REFLEX MICROSCOPIC
Glucose, UA: 250 mg/dL — AB
Hgb urine dipstick: NEGATIVE
Ketones, ur: 15 mg/dL — AB
Leukocytes, UA: NEGATIVE
NITRITE: NEGATIVE
PH: 5.5 (ref 5.0–8.0)
Protein, ur: 30 mg/dL — AB
SPECIFIC GRAVITY, URINE: 1.026 (ref 1.005–1.030)

## 2016-07-20 LAB — BRAIN NATRIURETIC PEPTIDE: B NATRIURETIC PEPTIDE 5: 137.6 pg/mL — AB (ref 0.0–100.0)

## 2016-07-20 LAB — URINE MICROSCOPIC-ADD ON

## 2016-07-20 LAB — I-STAT CG4 LACTIC ACID, ED: Lactic Acid, Venous: 3.36 mmol/L (ref 0.5–1.9)

## 2016-07-20 LAB — MRSA PCR SCREENING: MRSA by PCR: NEGATIVE

## 2016-07-20 LAB — GLUCOSE, CAPILLARY: Glucose-Capillary: 216 mg/dL — ABNORMAL HIGH (ref 65–99)

## 2016-07-20 LAB — LACTIC ACID, PLASMA: Lactic Acid, Venous: 1.4 mmol/L (ref 0.5–1.9)

## 2016-07-20 MED ORDER — SODIUM CHLORIDE 0.9% FLUSH
3.0000 mL | Freq: Two times a day (BID) | INTRAVENOUS | Status: DC
  Administered 2016-07-20 – 2016-07-25 (×6): 3 mL via INTRAVENOUS

## 2016-07-20 MED ORDER — VANCOMYCIN HCL IN DEXTROSE 1-5 GM/200ML-% IV SOLN
INTRAVENOUS | Status: AC
  Filled 2016-07-20: qty 400

## 2016-07-20 MED ORDER — CEFEPIME HCL 1 G IJ SOLR
INTRAMUSCULAR | Status: AC
  Filled 2016-07-20: qty 1

## 2016-07-20 MED ORDER — ONDANSETRON HCL 4 MG/2ML IJ SOLN: 4.0000 mg | Freq: Four times a day (QID) | INTRAMUSCULAR | Status: DC | PRN

## 2016-07-20 MED ORDER — INFLUENZA VAC SPLIT QUAD 0.5 ML IM SUSY: 0.5000 mL | PREFILLED_SYRINGE | INTRAMUSCULAR | Status: DC

## 2016-07-20 MED ORDER — FENTANYL CITRATE (PF) 100 MCG/2ML IJ SOLN
50.0000 ug | Freq: Once | INTRAMUSCULAR | Status: AC
  Administered 2016-07-20: 50 ug via INTRAVENOUS
  Filled 2016-07-20: qty 2

## 2016-07-20 MED ORDER — VANCOMYCIN HCL 10 G IV SOLR
15.0000 mg/kg | Freq: Once | INTRAVENOUS | Status: DC
  Filled 2016-07-20: qty 1769

## 2016-07-20 MED ORDER — ORAL CARE MOUTH RINSE
15.0000 mL | Freq: Two times a day (BID) | OROMUCOSAL | Status: DC
  Administered 2016-07-22 – 2016-07-27 (×7): 15 mL via OROMUCOSAL

## 2016-07-20 MED ORDER — IOPAMIDOL (ISOVUE-370) INJECTION 76%
100.0000 mL | Freq: Once | INTRAVENOUS | Status: AC | PRN
  Administered 2016-07-20: 80 mL via INTRAVENOUS

## 2016-07-20 MED ORDER — DEXTROSE 5 % IV SOLN
1.0000 g | Freq: Once | INTRAVENOUS | Status: AC
  Administered 2016-07-20: 1 g via INTRAVENOUS

## 2016-07-20 MED ORDER — HYDROMORPHONE HCL 1 MG/ML IJ SOLN
1.0000 mg | INTRAMUSCULAR | Status: DC | PRN
  Administered 2016-07-20: 1 mg via INTRAVENOUS
  Administered 2016-07-21 (×2): 1.5 mg via INTRAVENOUS
  Administered 2016-07-21 (×3): 1 mg via INTRAVENOUS
  Administered 2016-07-22 (×2): 1.5 mg via INTRAVENOUS
  Administered 2016-07-22 – 2016-07-27 (×5): 1 mg via INTRAVENOUS
  Administered 2016-07-27: 0.5 mg via INTRAVENOUS
  Filled 2016-07-20 (×2): qty 2
  Filled 2016-07-20 (×4): qty 1
  Filled 2016-07-20: qty 2
  Filled 2016-07-20 (×3): qty 1
  Filled 2016-07-20 (×3): qty 2
  Filled 2016-07-20 (×4): qty 1

## 2016-07-20 MED ORDER — ACETAMINOPHEN 325 MG PO TABS
650.0000 mg | ORAL_TABLET | Freq: Four times a day (QID) | ORAL | Status: DC | PRN
  Administered 2016-07-24 – 2016-07-25 (×2): 650 mg via ORAL
  Filled 2016-07-20 (×2): qty 2

## 2016-07-20 MED ORDER — INSULIN ASPART 100 UNIT/ML ~~LOC~~ SOLN
0.0000 [IU] | SUBCUTANEOUS | Status: DC
  Administered 2016-07-20: 5 [IU] via SUBCUTANEOUS
  Administered 2016-07-21: 2 [IU] via SUBCUTANEOUS
  Administered 2016-07-21: 3 [IU] via SUBCUTANEOUS
  Administered 2016-07-21 (×3): 2 [IU] via SUBCUTANEOUS
  Administered 2016-07-21 (×2): 3 [IU] via SUBCUTANEOUS
  Administered 2016-07-23 – 2016-07-25 (×9): 2 [IU] via SUBCUTANEOUS
  Administered 2016-07-26: 3 [IU] via SUBCUTANEOUS
  Administered 2016-07-26: 2 [IU] via SUBCUTANEOUS

## 2016-07-20 MED ORDER — SODIUM CHLORIDE 0.9 % IV BOLUS (SEPSIS)
1000.0000 mL | Freq: Once | INTRAVENOUS | Status: AC
  Administered 2016-07-20: 1000 mL via INTRAVENOUS

## 2016-07-20 MED ORDER — ONDANSETRON HCL 4 MG PO TABS: 4.0000 mg | ORAL_TABLET | Freq: Four times a day (QID) | ORAL | Status: DC | PRN

## 2016-07-20 MED ORDER — PIPERACILLIN-TAZOBACTAM 3.375 G IVPB
3.3750 g | Freq: Three times a day (TID) | INTRAVENOUS | Status: DC
  Administered 2016-07-21 – 2016-07-27 (×20): 3.375 g via INTRAVENOUS
  Filled 2016-07-20 (×19): qty 50

## 2016-07-20 MED ORDER — HEPARIN BOLUS VIA INFUSION
4000.0000 [IU] | Freq: Once | INTRAVENOUS | Status: AC
  Administered 2016-07-20: 4000 [IU] via INTRAVENOUS

## 2016-07-20 MED ORDER — ACETAMINOPHEN 650 MG RE SUPP: 650.0000 mg | Freq: Four times a day (QID) | RECTAL | Status: DC | PRN

## 2016-07-20 MED ORDER — PIPERACILLIN-TAZOBACTAM 3.375 G IVPB 30 MIN
3.3750 g | Freq: Once | INTRAVENOUS | Status: AC
  Administered 2016-07-20: 3.375 g via INTRAVENOUS
  Filled 2016-07-20 (×2): qty 50

## 2016-07-20 MED ORDER — SODIUM CHLORIDE 0.9 % IV SOLN
INTRAVENOUS | Status: DC
  Administered 2016-07-20 – 2016-07-22 (×6): via INTRAVENOUS

## 2016-07-20 MED ORDER — LACTATED RINGERS IV SOLN
INTRAVENOUS | Status: DC
  Administered 2016-07-20: 17:00:00 via INTRAVENOUS

## 2016-07-20 MED ORDER — HEPARIN (PORCINE) IN NACL 100-0.45 UNIT/ML-% IJ SOLN
1750.0000 [IU]/h | INTRAMUSCULAR | Status: DC
  Administered 2016-07-20 – 2016-07-21 (×2): 1600 [IU]/h via INTRAVENOUS
  Filled 2016-07-20 (×3): qty 250

## 2016-07-20 MED ORDER — VANCOMYCIN HCL IN DEXTROSE 1-5 GM/200ML-% IV SOLN
1000.0000 mg | INTRAVENOUS | Status: AC
  Administered 2016-07-20: 1000 mg via INTRAVENOUS

## 2016-07-20 MED ORDER — NALOXONE HCL 0.4 MG/ML IJ SOLN: 0.4000 mg | Freq: Once | INTRAMUSCULAR | Status: DC
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
## 2016-07-20 NOTE — Progress Notes (Signed)
Pharmacy Antibiotic Note  MICHALL NOFFKE is a 82 y.o. male admitted on 07/20/2016 with 1-2 days of abdominal pain.  Pharmacy has been consulted for zosyn for intra-abdominal dosing.  Pt received one dose of vancomycin, zosyn and cefepime earlier today.   Plan: Zosyn 3.375g IV Q8H infused over 4hrs. Follow cultures, renal function, clinical course  Height: 5\' 11"  (180.3 cm) Weight: 287 lb 14.7 oz (130.6 kg) IBW/kg (Calculated) : 75.3  Temp (24hrs), Avg:99.4 F (37.4 C), Min:98.3 F (36.8 C), Max:101.8 F (38.8 C)   Recent Labs Lab 07/20/16 1126 07/20/16 1148  WBC 30.1*  --   CREATININE 1.58*  --   LATICACIDVEN  --  3.36*    Estimated Creatinine Clearance: 49.7 mL/min (by C-G formula based on SCr of 1.58 mg/dL (H)).    No Known Allergies  Antimicrobials this admission: 11/22 zosyn >> 11/22 vanc x 1 11/22 cefepime x1  Microbiology results: 11/22 BCx: sent 11/22 MRSA PCR: neg  Thank you for allowing pharmacy to be a part of this patient's care.  Dolly Rias RPh 07/20/2016, 8:20 PM Pager [phone]

## 2016-07-20 NOTE — Progress Notes (Signed)
Called by Dr Dayna Barker at [facility] ED.  82 Y/O male with HTN, CKD stage 3,DM  Recent hospitalization (6 week back)  for ankle fracture, sent to SNF with cast was found to be weak and lethargic today.  in the ED, he was septic with hypotension,  temp of 101F, WBC of 30k , elevated troponin of 0.26 and lactic acid of >3.   CT angio chest showed possible left lower lobe PE. Had abdominal discomfort on exam showing ? Emphysematous cholecystitis.  Received IV fluid bolus, IV Cefepime x1 and IV zosyn x1. Mentation clearing. EKG unremarkable  [facility] Dr Redmond Pulling consulted by ED re: gallbladder findings. Recommended that surgery would consult once admitted.  mental status clearing and BP improved with IV bolus and abx.  accepted to SDU bed at [facility]. ED to start iV heparin.  blood cx sent. UA unremarkable.  requested to initiate sepsis pathway and send flu PCR.

## 2016-07-20 NOTE — ED Notes (Signed)
Pt nurse Megan and Dr. Dayna Barker alerted to pt critical troponin of 0.26

## 2016-07-20 NOTE — ED Triage Notes (Addendum)
Patient reports he was trying to get out of bed to his motorized wheelchair but was unable to get to it and he slid down to the floor.  He is from [facility] and was suppose to be discharged home today from rehab but was found in the floor naked.  Patient reports he didn't fall but slide into the floor and reports he was naked because he sleeps that way because its easier to void at night.  Patient was given pain medication after the slip/fall, now patient is hard to arouse and understand.  Oxygen levels on room air are in the low 80's.  Wife noticed drainage from eyes.  Patient denies any other symptoms.  Denies cough, fever, urinary symptoms.
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
## 2016-07-20 NOTE — ED Notes (Signed)
CBG 217. Jinny Blossom, RN notified.
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
## 2016-07-20 NOTE — ED Notes (Signed)
Patient transported to CT 

## 2016-07-20 NOTE — Progress Notes (Signed)
ANTICOAGULATION CONSULT NOTE - Initial Consult  Pharmacy Consult for heparin Indication: pulmonary embolus  No Known Allergies  Patient Measurements: Height: 5\' 11"  (180.3 cm) Weight: 260 lb (117.9 kg) IBW/kg (Calculated) : 75.3 Heparin Dosing Weight: 101.3  Vital Signs: Temp: 101.8 F (38.8 C) (11/22 1201) Temp Source: Rectal (11/22 1201) BP: 104/61 (11/22 1329) Pulse Rate: 95 (11/22 1329)  Labs:  Recent Labs  07/20/16 1126  HGB 14.5  HCT 44.0  PLT 308  CREATININE 1.58*  TROPONINI 0.26*    Estimated Creatinine Clearance: 47.1 mL/min (by C-G formula based on SCr of 1.58 mg/dL (H)).   Medical History: Past Medical History:  Diagnosis Date  . Arthritis   . Chronic back pain   . Constipation due to opioid therapy   . Diabetes mellitus without complication ([place])   . Full dentures   . Gastric perforation ([place])   . GERD (gastroesophageal reflux disease)   . Hypercholesteremia   . Hypertension    Pt is not aware that he is treated for HTN, found it listed in "Problem list"  . Pneumonia    1960's  . Post traumatic stress disorder (PTSD)    [place] War  . Sleep apnea    does not  use cpap  . Wears glasses   . Wears hearing aid    both ears     Assessment: 82 yo M at [facility] for rehab due to be discharged today. PMH HTN, CKD stage 3, DM, hospital 6 weeks ago w/ ankle fx, sent to SNF with cast.  At [facility] with hypotension, temp 101, WBC 30, elevated troponin, lactic acid > 3. CT angio possible LLL PE.  Pharmacy consulted to dose heparin. Wt 117.9 kg, HDW 101.3 kg. Creat 1.59. CBC WNL.  PTA meds locked but no mention of anticoagulants - assist called to unlock PTA med chart. On recent admission was on LMWH 40 qday from 10/7 - 06/07/2016.   Goal of Therapy:  Heparin level 0.3-0.7 units/ml Monitor platelets by anticoagulation protocol: Yes   Plan:  Give 4000 units bolus x 1 Start heparin infusion at 1600 units/hr Check anti-Xa level in 8 hours  and daily while on heparin Continue to monitor H&H and platelets  Eudelia Bunch, Pharm.D. [phone] 07/20/2016 4:49 PM

## 2016-07-20 NOTE — ED Notes (Signed)
Critical lactic acid 3.36 reported to [place], [place]

## 2016-07-20 NOTE — Progress Notes (Signed)
Pt has refused CPAP for the night.  RT to monitor and assess as needed.  

## 2016-07-21 ENCOUNTER — Inpatient Hospital Stay (HOSPITAL_COMMUNITY): Payer: Medicare Other

## 2016-07-21 ENCOUNTER — Encounter (HOSPITAL_COMMUNITY): Payer: Self-pay | Admitting: Interventional Radiology

## 2016-07-21 DIAGNOSIS — N183 Chronic kidney disease, stage 3 (moderate): Secondary | ICD-10-CM

## 2016-07-21 DIAGNOSIS — E1122 Type 2 diabetes mellitus with diabetic chronic kidney disease: Secondary | ICD-10-CM

## 2016-07-21 DIAGNOSIS — R0602 Shortness of breath: Secondary | ICD-10-CM

## 2016-07-21 DIAGNOSIS — Z9989 Dependence on other enabling machines and devices: Secondary | ICD-10-CM

## 2016-07-21 DIAGNOSIS — K810 Acute cholecystitis: Secondary | ICD-10-CM

## 2016-07-21 DIAGNOSIS — G4733 Obstructive sleep apnea (adult) (pediatric): Secondary | ICD-10-CM

## 2016-07-21 DIAGNOSIS — I2699 Other pulmonary embolism without acute cor pulmonale: Secondary | ICD-10-CM

## 2016-07-21 HISTORY — PX: IR GENERIC HISTORICAL: IMG1180011

## 2016-07-21 LAB — CBC
HCT: 38 % — ABNORMAL LOW (ref 39.0–52.0)
HEMOGLOBIN: 12.2 g/dL — AB (ref 13.0–17.0)
MCH: 28.2 pg (ref 26.0–34.0)
MCHC: 32.1 g/dL (ref 30.0–36.0)
MCV: 87.8 fL (ref 78.0–100.0)
Platelets: 255 10*3/uL (ref 150–400)
RBC: 4.33 MIL/uL (ref 4.22–5.81)
RDW: 14 % (ref 11.5–15.5)
WBC: 28.5 10*3/uL — ABNORMAL HIGH (ref 4.0–10.5)

## 2016-07-21 LAB — COMPREHENSIVE METABOLIC PANEL
ALBUMIN: 2.8 g/dL — AB (ref 3.5–5.0)
ALK PHOS: 71 U/L (ref 38–126)
ALT: 13 U/L — ABNORMAL LOW (ref 17–63)
ANION GAP: 6 (ref 5–15)
AST: 17 U/L (ref 15–41)
BILIRUBIN TOTAL: 1 mg/dL (ref 0.3–1.2)
BUN: 27 mg/dL — AB (ref 6–20)
CALCIUM: 8.2 mg/dL — AB (ref 8.9–10.3)
CO2: 27 mmol/L (ref 22–32)
Chloride: 102 mmol/L (ref 101–111)
Creatinine, Ser: 1.56 mg/dL — ABNORMAL HIGH (ref 0.61–1.24)
GFR calc Af Amer: 46 mL/min — ABNORMAL LOW (ref >60)
GFR calc non Af Amer: 40 mL/min — ABNORMAL LOW (ref >60)
GLUCOSE: 186 mg/dL — AB (ref 65–99)
Potassium: 4 mmol/L (ref 3.5–5.1)
Sodium: 135 mmol/L (ref 135–145)
TOTAL PROTEIN: 6 g/dL — AB (ref 6.5–8.1)

## 2016-07-21 LAB — GLUCOSE, CAPILLARY
GLUCOSE-CAPILLARY: 128 mg/dL — AB (ref 65–99)
GLUCOSE-CAPILLARY: 145 mg/dL — AB (ref 65–99)
GLUCOSE-CAPILLARY: 151 mg/dL — AB (ref 65–99)
Glucose-Capillary: 131 mg/dL — ABNORMAL HIGH (ref 65–99)
Glucose-Capillary: 134 mg/dL — ABNORMAL HIGH (ref 65–99)
Glucose-Capillary: 153 mg/dL — ABNORMAL HIGH (ref 65–99)
Glucose-Capillary: 169 mg/dL — ABNORMAL HIGH (ref 65–99)

## 2016-07-21 LAB — APTT: aPTT: 138 seconds — ABNORMAL HIGH (ref 24–36)

## 2016-07-21 LAB — HEPARIN LEVEL (UNFRACTIONATED)
HEPARIN UNFRACTIONATED: 0.27 [IU]/mL — AB (ref 0.30–0.70)
Heparin Unfractionated: 0.4 IU/mL (ref 0.30–0.70)
Heparin Unfractionated: 0.28 IU/mL — ABNORMAL LOW (ref 0.30–0.70)

## 2016-07-21 LAB — PROTIME-INR
INR: 1.42
PROTHROMBIN TIME: 17.4 s — AB (ref 11.4–15.2)

## 2016-07-21 MED ORDER — HEPARIN (PORCINE) IN NACL 100-0.45 UNIT/ML-% IJ SOLN: 2000.0000 [IU]/h | INTRAMUSCULAR | Status: DC

## 2016-07-21 MED ORDER — FENTANYL CITRATE (PF) 100 MCG/2ML IJ SOLN
50.0000 ug | Freq: Once | INTRAMUSCULAR | Status: AC
Start: 2016-07-21 — End: 2016-07-21
  Administered 2016-07-21: 50 ug via INTRAVENOUS

## 2016-07-21 MED ORDER — MIDAZOLAM HCL 2 MG/2ML IJ SOLN
INTRAMUSCULAR | Status: AC
  Filled 2016-07-21: qty 2

## 2016-07-21 MED ORDER — HYDROMORPHONE HCL 2 MG/ML IJ SOLN: 1.5000 mg | Freq: Once | INTRAMUSCULAR | Status: DC

## 2016-07-21 MED ORDER — DOCUSATE SODIUM 100 MG PO CAPS
100.0000 mg | ORAL_CAPSULE | Freq: Two times a day (BID) | ORAL | Status: DC
  Administered 2016-07-21 – 2016-07-27 (×12): 100 mg via ORAL
  Filled 2016-07-21 (×12): qty 1

## 2016-07-21 MED ORDER — HEPARIN (PORCINE) IN NACL 100-0.45 UNIT/ML-% IJ SOLN
2800.0000 [IU]/h | INTRAMUSCULAR | Status: DC
  Administered 2016-07-21: 2000 [IU]/h via INTRAVENOUS
  Administered 2016-07-22: 2100 [IU]/h via INTRAVENOUS
  Administered 2016-07-23 (×2): 2800 [IU]/h via INTRAVENOUS
  Administered 2016-07-23: 2400 [IU]/h via INTRAVENOUS
  Administered 2016-07-24 (×3): 2800 [IU]/h via INTRAVENOUS
  Filled 2016-07-21 (×9): qty 250

## 2016-07-21 MED ORDER — PANTOPRAZOLE SODIUM 40 MG PO TBEC
40.0000 mg | DELAYED_RELEASE_TABLET | Freq: Every day | ORAL | Status: DC
  Administered 2016-07-21 – 2016-07-27 (×7): 40 mg via ORAL
  Filled 2016-07-21 (×7): qty 1

## 2016-07-21 MED ORDER — HYDROMORPHONE HCL 1 MG/ML IJ SOLN
1.0000 mg | Freq: Once | INTRAMUSCULAR | Status: AC
  Administered 2016-07-21: 1 mg via INTRAVENOUS

## 2016-07-21 MED ORDER — MIDAZOLAM HCL 2 MG/2ML IJ SOLN
1.0000 mg | Freq: Once | INTRAMUSCULAR | Status: AC
  Administered 2016-07-21: 1 mg via INTRAVENOUS

## 2016-07-21 MED ORDER — FENTANYL CITRATE (PF) 100 MCG/2ML IJ SOLN
INTRAMUSCULAR | Status: AC
  Filled 2016-07-21: qty 2

## 2016-07-21 MED ORDER — METHOCARBAMOL 500 MG PO TABS
750.0000 mg | ORAL_TABLET | Freq: Three times a day (TID) | ORAL | Status: DC
  Administered 2016-07-21 – 2016-07-27 (×20): 750 mg via ORAL
  Filled 2016-07-21 (×21): qty 2

## 2016-07-21 MED ORDER — TAMSULOSIN HCL 0.4 MG PO CAPS
0.4000 mg | ORAL_CAPSULE | Freq: Every day | ORAL | Status: DC
  Administered 2016-07-21 – 2016-07-26 (×6): 0.4 mg via ORAL
  Filled 2016-07-21 (×6): qty 1

## 2016-07-21 MED ORDER — MIDAZOLAM HCL 2 MG/2ML IJ SOLN
1.0000 mg | Freq: Once | INTRAMUSCULAR | Status: AC
Start: 2016-07-21 — End: 2016-07-21
  Administered 2016-07-21: 10:00:00 via INTRAVENOUS

## 2016-07-21 MED ORDER — HYDROMORPHONE HCL 1 MG/ML IJ SOLN
1.5000 mg | Freq: Once | INTRAMUSCULAR | Status: AC
  Administered 2016-07-21: 1.5 mg via INTRAVENOUS

## 2016-07-21 MED ORDER — INSULIN DETEMIR 100 UNIT/ML ~~LOC~~ SOLN
20.0000 [IU] | Freq: Every day | SUBCUTANEOUS | Status: DC
  Administered 2016-07-21 – 2016-07-26 (×6): 20 [IU] via SUBCUTANEOUS
  Filled 2016-07-21 (×7): qty 0.2

## 2016-07-21 MED ORDER — IOPAMIDOL (ISOVUE-300) INJECTION 61%: 10.0000 mL | Freq: Once | INTRAVENOUS | Status: DC | PRN

## 2016-07-21 MED ORDER — METOPROLOL TARTRATE 5 MG/5ML IV SOLN
2.5000 mg | Freq: Once | INTRAVENOUS | Status: AC
  Administered 2016-07-21: 2.5 mg via INTRAVENOUS
  Filled 2016-07-21: qty 5

## 2016-07-21 MED ORDER — IOPAMIDOL (ISOVUE-300) INJECTION 61%
INTRAVENOUS | Status: AC
  Administered 2016-07-21: 10 mL
  Filled 2016-07-21: qty 50

## 2016-07-21 MED ORDER — LIDOCAINE HCL 1 % IJ SOLN
INTRAMUSCULAR | Status: AC
  Filled 2016-07-21: qty 20

## 2016-07-21 NOTE — Progress Notes (Signed)
Pt transported to IR at [facility] by RN, Kennis Carina, and transportation staff. Pt accompanied in IR by RN, Buren Kos. Pt alert and orient, VSS, transported on 2L [place].

## 2016-07-21 NOTE — Progress Notes (Signed)
Since back from IR procedure, pt has been more fidgety and restless then he was prior to procedure. PRN pain medication administered, with no relief. Pt ST with rate of 105, oxygen saturation of 92% on 2LNC, and BP WDL. MD, Dr. Charlies Silvers, paged regarding patient's increased restlessness and fidgeting. Will continue to monitor.

## 2016-07-21 NOTE — Procedures (Signed)
Acute cholecystitis  S/p perc cholecystostomy  No comp Stable EBL 0 Full report in PACS

## 2016-07-21 NOTE — Progress Notes (Signed)
VASCULAR LAB PRELIMINARY  PRELIMINARY  PRELIMINARY  PRELIMINARY  Bilateral lower extremity venous duplex completed.    Preliminary report:  There is no obvious evidence of DVT or SVT noted in the visualized veins in the bilateral lower extremities.   Marcella Charlson, RVT 07/21/2016, 11:32 AM

## 2016-07-21 NOTE — Progress Notes (Signed)
Off floor in IR Agree with perc cholecystostomy tube given severity of GB infection If alert, approp after procedure and not n/v - can have clears  Leighton Ruff. Redmond Pulling, MD, FACS General, Bariatric, & Minimally Invasive Surgery [facility], [place]

## 2016-07-21 NOTE — Progress Notes (Signed)
ANTICOAGULATION CONSULT NOTE - Follow Up Consult  Pharmacy Consult for Heparin Indication: pulmonary embolus  No Known Allergies  Patient Measurements: Height: 5\' 11"  (180.3 cm) Weight: 287 lb 14.7 oz (130.6 kg) IBW/kg (Calculated) : 75.3 HEPARIN DW (KG): 105.1   Vital Signs: Temp: 99.6 F (37.6 C) (11/23 0417) Temp Source: Oral (11/23 0417) BP: 119/59 (11/23 0500)  Labs:  Recent Labs  07/20/16 1126 07/20/16 2039 07/21/16 0100 07/21/16 0537  HGB 14.5  --  12.2*  --   HCT 44.0  --  38.0*  --   PLT 308  --  255  --   APTT  --   --  138*  --   LABPROT  --   --  17.4*  --   INR  --   --  1.42  --   HEPARINUNFRC  --   --  0.40 0.27*  CREATININE 1.Justin*  --  1.56*  --   TROPONINI 0.26* <0.03  --   --     Estimated Creatinine Clearance: 50.3 mL/min (by C-G formula based on SCr of 1.56 mg/dL (H)).   Medications:  Infusions:  . sodium chloride 150 mL/hr at 07/21/16 0600  . heparin 1,600 Units/hr (07/21/16 0569)    Assessment: 82 yo [name] at [facility] for rehab due to be discharged today. PMH HTN, CKD stage 3, DM, hospital 6 weeks ago w/ ankle fx, sent to SNF with cast.  At [facility] with hypotension, temp 101, WBC 30, elevated troponin, lactic acid > 3. CT angio possible LLL PE.  Pharmacy consulted to dose heparin No anticoagulants PTA.  07/21/2016:  Heparin level now subtherapeutic on 1600 units/hr  CBC wnl.  No bleeding reported by RN  IV site continues to "kink"; no other infusion related issues per RN  Noted plan for percutaneous cholecystostomy tube placement by IR.  Goal of Therapy:  Heparin level 0.3-0.7 units/ml Monitor platelets by anticoagulation protocol: Yes   Plan:  Increase heparin drip to 1750 units/hr Recheck 8h heparin level Daily heparin level & CBC F/U peri-procedure heparin plans  Netta Cedars, PharmD, BCPS Pager: [phone] 07/21/2016,7:41 AM

## 2016-07-21 NOTE — Progress Notes (Signed)
Pt has declined the use of CPAP at this time.  RT to monitor and assess as needed. 

## 2016-07-21 NOTE — Progress Notes (Addendum)
1520. Dr. Charlies Silvers paged regarding pt HR of 135-138 bpm and urine output of 200 mL for duration of shift. Orders received. Will continue to monitor.

## 2016-07-21 NOTE — Progress Notes (Addendum)
Patient ID: [name], male   DOB: Dec 02, 1931, 82 y.o.   MRN: [id number]  PROGRESS NOTE    [name]  VJK:[id number] DOB: 08-12-1932 DOA: 07/20/2016  PCP: Stephens Shire, MD   Brief Narrative:   82 year old male with past medical history significant for obstructive sleep apnea on Cipro, hypertension, chronic kidney disease stage III, insulin-dependent diabetes mellitus, posttraumatic stress disorder, chronic back pain on oral Dilaudid who presented to hospital with abdominal pain and fever. The patient was recently admitted 10/7-10/11 for non-operative mgmt of right ankle fracture and discharged to SNF. Over past couple of days his abdominal pain just progressively got worse and he had associated nausea and fever. In addition, patient appear more confused as per family member report.   In ED, patient was febrile with Tmax 101.45F, HR in 90's, RR 20, oxygen saturation in low 90s on 2 L nasal cannula oxygen support. Blood pressure was 106/56. Blood work showed white blood cell count of 30, lactate 3.36, mild elevation in troponin 0.26. Urinalysis was without pyuria or hematuria. Chest x-ray showed small lung volumes. CT head showed no acute intracranial findings. CT angiogram of the chest and abdomen showed left small pulmonary artery embolism as well as emphysematous cholecystitis versus fistula. Patient was given vancomycin, Zosyn on the admission for sepsis and IV fluids and started on heparin drip. He was seen by surgery in consultation who recommended percutaneous cholecystostomy tube placement by IR.  Assessment & Plan:   Principal Problem:   Sepsis secondary to emphysematous cholecystitis / Leukocytosis / abdominal pain, nausea - Sepsis criteria met on the admission with fever, tachycardia, tachypnea, hypoxia, leukocytosis and lactic acidosis - Source of infection is emphysematous cholecystitis - Patient was seen by surgery in consultation and recommendation is for percutaneous  cholecystostomy tube placement by IR which will take place today  - Continue Zosyn  - Lactic acid normalized - Continue to monitor in step down unit   Active Problems:   Acute metabolic encephalopathy - Secondary to sepsis - Continue to monitor mental status     Obesity due to excess calories - Body mass index is 40.16 kg/m. - Nutrition consulted     Diabetes mellitus with diabetic nephropathy with long-term insulin use  - Continue Levemir 20 units at bedtime along with sliding scale insulin     OSA on CPAP - Stable respiratory status    CKD stage 3 (HCC) - Cr 1.5 on admission, slightly above baseline value of 1.25 about one month ago - Elevated creatinine likely secondary to sepsis    Acute pulmonary embolism (HCC) - Started on heparin drip    DVT prophylaxis: Heparin drip (will be stopped temp for perc tube placement) Code Status: full code  Family Communication: no family at the bedside this am Disposition Plan: remains in SDU   Consultants:   Surgery   Interventional radiology  Procedures:   None  at this time but plan for perc cholecystostomy tube for today   Antimicrobials:   Zosyn 07/20/2016 -->  Vancomycin 1 dose given in ED    Subjective: No overnight events.   Objective: Vitals:   07/21/16 0500 07/21/16 0800 07/21/16 0900 07/21/16 0956  BP: (!) 119/59 (!) 118/55 109/84 (!) 127/54  Pulse:      Resp: '17 16 18 ' (!) 22  Temp:  99.4 F (37.4 C)    TempSrc:  Oral    SpO2: (!) 89% 95% 93% 96%  Weight:      Height:  Intake/Output Summary (Last 24 hours) at 07/21/16 1025 Last data filed at 07/21/16 0900  Gross per 24 hour  Intake           4409.1 ml  Output              275 ml  Net           4134.1 ml   Filed Weights   07/20/16 1100 07/20/16 1840  Weight: 117.9 kg (260 lb) 130.6 kg (287 lb 14.7 oz)    Examination:  General exam: Appears calm and comfortable  Respiratory system: Clear to auscultation. Respiratory effort  normal. Cardiovascular system: S1 & S2 heard, RRR. No JVD, murmurs, rubs, gallops or clicks. No pedal edema. Gastrointestinal system: Abdomen is nondistended, soft and nontender. No organomegaly or masses felt. Normal bowel sounds heard. Central nervous system: Disoriented. No focal neurological deficits. Extremities: Symmetric 5 x 5 power. Skin: No rashes, lesions or ulcers Psychiatry: Mood & affect appropriate.   Data Reviewed: I have personally reviewed following labs and imaging studies  CBC:  Recent Labs Lab 07/20/16 1126 07/21/16 0100  WBC 30.1* 28.5*  HGB 14.5 12.2*  HCT 44.0 38.0*  MCV 86.6 87.8  PLT 308 960   Basic Metabolic Panel:  Recent Labs Lab 07/20/16 1126 07/21/16 0100  NA 133* 135  K 4.1 4.0  CL 97* 102  CO2 23 27  GLUCOSE 255* 186*  BUN 21* 27*  CREATININE 1.58* 1.56*  CALCIUM 9.1 8.2*   GFR: Estimated Creatinine Clearance: 50.3 mL/min (by C-G formula based on SCr of 1.56 mg/dL (H)). Liver Function Tests:  Recent Labs Lab 07/20/16 1126 07/21/16 0100  AST 24 17  ALT 15* 13*  ALKPHOS 94 71  BILITOT 0.7 1.0  PROT 7.4 6.0*  ALBUMIN 3.8 2.8*   No results for input(s): LIPASE, AMYLASE in the last 168 hours. No results for input(s): AMMONIA in the last 168 hours. Coagulation Profile:  Recent Labs Lab 07/21/16 0100  INR 1.42   Cardiac Enzymes:  Recent Labs Lab 07/20/16 1126 07/20/16 2039  TROPONINI 0.26* <0.03   BNP (last 3 results) No results for input(s): PROBNP in the last 8760 hours. HbA1C: No results for input(s): HGBA1C in the last 72 hours. CBG:  Recent Labs Lab 07/20/16 1122 07/20/16 2052 07/21/16 0020 07/21/16 0359 07/21/16 0818  GLUCAP 217* 216* 169* 153* 151*   Lipid Profile: No results for input(s): CHOL, HDL, LDLCALC, TRIG, CHOLHDL, LDLDIRECT in the last 72 hours. Thyroid Function Tests: No results for input(s): TSH, T4TOTAL, FREET4, T3FREE, THYROIDAB in the last 72 hours. Anemia Panel: No results for  input(s): VITAMINB12, FOLATE, FERRITIN, TIBC, IRON, RETICCTPCT in the last 72 hours. Urine analysis:    Component Value Date/Time   COLORURINE AMBER (A) 07/20/2016 1151   APPEARANCEUR CLEAR 07/20/2016 1151   LABSPEC 1.026 07/20/2016 1151   PHURINE 5.5 07/20/2016 1151   GLUCOSEU 250 (A) 07/20/2016 1151   HGBUR NEGATIVE 07/20/2016 1151   BILIRUBINUR SMALL (A) 07/20/2016 1151   KETONESUR 15 (A) 07/20/2016 1151   PROTEINUR 30 (A) 07/20/2016 1151   UROBILINOGEN 1.0 11/22/2012 1633   NITRITE NEGATIVE 07/20/2016 1151   LEUKOCYTESUR NEGATIVE 07/20/2016 1151   Sepsis Labs: [email or web address]' (procalcitonin:4,lacticidven:4)   MRSA PCR Screening     Status: None   Collection Time: 07/20/16  5:46 PM  Result Value Ref Range Status   MRSA by PCR NEGATIVE NEGATIVE Final      Radiology Studies: Dg Chest 2 View Result Date: 07/20/2016 Low lung volumes  with bibasilar atelectasis. Bibasilar pneumonia cannot be excluded. Small left pleural effusion.   Ct Head Wo Contrast Result Date: 07/20/2016 No acute abnormality.   Ct Angio Chest Pe W And/or Wo Contrast Result Date: 07/20/2016 Filling defect at the bifurcation of the LEFT lower lobe pulmonary artery consistent with a small age-indeterminate pulmonary embolus. Subsegmental atelectasis in BILATERAL lower lobes. Distended gallbladder with thickened wall, significant pericholecystic infiltrative changes, air within the gallbladder, and question minimal gas within the gallbladder wall. These findings could represent emphysematous cholecystitis or could be related to prior erosion of a gallstone from the gallbladder neck to the duodenum. Aortic atherosclerosis and mild coronary arterial calcification. BILATERAL inguinal hernias containing fat.   Ct Abdomen Pelvis W Contrast Result Date: 07/20/2016 Filling defect at the bifurcation of the LEFT lower lobe pulmonary artery consistent with a small age-indeterminate pulmonary embolus. Subsegmental atelectasis  in BILATERAL lower lobes. Distended gallbladder with thickened wall, significant pericholecystic infiltrative changes, air within the gallbladder, and question minimal gas within the gallbladder wall. These findings could represent emphysematous cholecystitis or could be related to prior erosion of a gallstone from the gallbladder neck to the duodenum. Aortic atherosclerosis and mild coronary arterial calcification. BILATERAL inguinal hernias containing fat.     Scheduled Meds: . docusate sodium  100 mg Oral BID        . insulin aspart  0-15 Units Subcutaneous Q4H  . insulin detemir  20 Units Subcutaneous QHS  . methocarbamol  750 mg Oral TID  . pantoprazole  40 mg Oral Daily  . piperacillin-tazobac  3.375 g Intravenous Q8H  . tamsulosin  0.4 mg Oral QPC supper   Continuous Infusions: . sodium chloride 150 mL/hr at 07/21/16 0919  . heparin Stopped (07/21/16 0840)     LOS: 1 day    Time spent: 25 minutes  Greater than 50% of the time spent on counseling and coordinating the care.   [name], MD [facility] Pager [phone]  If 7PM-7AM, please contact night-coverage [email or web address] Password [facility] 07/21/2016, 10:25 AM

## 2016-07-21 NOTE — Progress Notes (Signed)
Pharmacy - IV Heparin  Assessment:    Please see note from Netta Cedars, PharmD earlier today for full details.  Briefly, 82 y.o. male on IV heparin for new suspected PE. Most recent heparin level subtherapeutic at 0.27   8 hr repeat heparin level still subtherapeutic and unchanged at 0.28 on 1750 units/hr  Patient underwent placement of cholecystostomy tube this AM, heparin off for approx 2 hrs; restarted by IR without any report of complications.   Per RN this evening, site looks fine and no bloody drainage. No visible issues with infusion line, and heparin was not turned off at any point other than for drain placement.  Plan:   Increase heparin IV to 2000 units/hr (will be slightly more aggressive d/t indication and body habitus)  Recheck level in 8 hr  Reuel Boom, PharmD, BCPS Pager: [phone] 07/21/2016, 3:02 PM

## 2016-07-21 NOTE — Progress Notes (Signed)
ANTICOAGULATION CONSULT NOTE - Follow Up Consult  Pharmacy Consult for Heparin Indication: pulmonary embolus  No Known Allergies  Patient Measurements: Height: 5\' 11"  (180.3 cm) Weight: 287 lb 14.7 oz (130.6 kg) IBW/kg (Calculated) : 75.3 Heparin Dosing Weight:   Vital Signs: Temp: 99.6 F (37.6 C) (11/23 0417) Temp Source: Oral (11/23 0417) BP: 101/59 (11/23 0411)  Labs:  Recent Labs  07/20/16 1126 07/20/16 2039 07/21/16 0100  HGB 14.5  --  12.2*  HCT 44.0  --  38.0*  PLT 308  --  255  APTT  --   --  138*  LABPROT  --   --  17.4*  INR  --   --  1.42  HEPARINUNFRC  --   --  0.40  CREATININE 1.58*  --  1.56*  TROPONINI 0.26* <0.03  --     Estimated Creatinine Clearance: 50.3 mL/min (by C-G formula based on SCr of 1.56 mg/dL (H)).   Medications:  Infusions:  . sodium chloride 150 mL/hr at 07/21/16 0304  . heparin 1,600 Units/hr (07/21/16 0000)    Assessment: Patient with heparin level at goal.  No heparin issues noted.  Goal of Therapy:  Heparin level 0.3-0.7 units/ml Monitor platelets by anticoagulation protocol: Yes   Plan:  Continue heparin drip at current rate Recheck level with 11/23 AM labs  [name], [name] 07/21/2016,4:56 AM

## 2016-07-22 ENCOUNTER — Inpatient Hospital Stay (HOSPITAL_COMMUNITY): Payer: Medicare Other

## 2016-07-22 DIAGNOSIS — I10 Essential (primary) hypertension: Secondary | ICD-10-CM

## 2016-07-22 LAB — BASIC METABOLIC PANEL
ANION GAP: 5 (ref 5–15)
BUN: 29 mg/dL — AB (ref 6–20)
CALCIUM: 8.2 mg/dL — AB (ref 8.9–10.3)
CO2: 26 mmol/L (ref 22–32)
Chloride: 109 mmol/L (ref 101–111)
Creatinine, Ser: 1.48 mg/dL — ABNORMAL HIGH (ref 0.61–1.24)
GFR calc Af Amer: 49 mL/min — ABNORMAL LOW (ref >60)
GFR, EST NON AFRICAN AMERICAN: 42 mL/min — AB (ref >60)
GLUCOSE: 141 mg/dL — AB (ref 65–99)
Potassium: 4.1 mmol/L (ref 3.5–5.1)
SODIUM: 140 mmol/L (ref 135–145)

## 2016-07-22 LAB — CBC
HCT: 36.7 % — ABNORMAL LOW (ref 39.0–52.0)
Hemoglobin: 11.7 g/dL — ABNORMAL LOW (ref 13.0–17.0)
MCH: 28.3 pg (ref 26.0–34.0)
MCHC: 31.9 g/dL (ref 30.0–36.0)
MCV: 88.6 fL (ref 78.0–100.0)
PLATELETS: 256 10*3/uL (ref 150–400)
RBC: 4.14 MIL/uL — ABNORMAL LOW (ref 4.22–5.81)
RDW: 13.9 % (ref 11.5–15.5)
WBC: 20.9 10*3/uL — AB (ref 4.0–10.5)

## 2016-07-22 LAB — GLUCOSE, CAPILLARY
GLUCOSE-CAPILLARY: 108 mg/dL — AB (ref 65–99)
GLUCOSE-CAPILLARY: 111 mg/dL — AB (ref 65–99)
GLUCOSE-CAPILLARY: 125 mg/dL — AB (ref 65–99)
GLUCOSE-CAPILLARY: 112 mg/dL — AB (ref 65–99)
Glucose-Capillary: 104 mg/dL — ABNORMAL HIGH (ref 65–99)

## 2016-07-22 LAB — HEPARIN LEVEL (UNFRACTIONATED)
Heparin Unfractionated: 0.31 IU/mL (ref 0.30–0.70)
Heparin Unfractionated: 0.22 IU/mL — ABNORMAL LOW (ref 0.30–0.70)

## 2016-07-22 MED ORDER — PREMIER PROTEIN SHAKE
11.0000 [oz_av] | Freq: Every day | ORAL | Status: DC
  Administered 2016-07-22 – 2016-07-27 (×5): 11 [oz_av] via ORAL
  Filled 2016-07-22 (×5): qty 325.31

## 2016-07-22 MED ORDER — LORAZEPAM 2 MG/ML IJ SOLN
2.0000 mg | INTRAMUSCULAR | Status: DC | PRN
  Administered 2016-07-23: 3 mg via INTRAVENOUS
  Administered 2016-07-23: 2 mg via INTRAVENOUS
  Filled 2016-07-22: qty 1
  Filled 2016-07-22: qty 2

## 2016-07-22 MED ORDER — ALBUTEROL SULFATE (2.5 MG/3ML) 0.083% IN NEBU
2.5000 mg | INHALATION_SOLUTION | Freq: Four times a day (QID) | RESPIRATORY_TRACT | Status: DC | PRN
  Administered 2016-07-22 – 2016-07-26 (×5): 2.5 mg via RESPIRATORY_TRACT
  Filled 2016-07-22 (×5): qty 3

## 2016-07-22 NOTE — Progress Notes (Addendum)
Patient ID: [name], male   DOB: [birth year], 82 y.o.   MRN: [id number]  PROGRESS NOTE    [name]  KXF:[id number] DOB: [birth year] DOA: 07/20/2016  PCP: Stephens Shire, MD   Brief Narrative:   82 year old male with past medical history significant for obstructive sleep apnea on Cipro, hypertension, chronic kidney disease stage III, insulin-dependent diabetes mellitus, posttraumatic stress disorder, chronic back pain on oral Dilaudid who presented to hospital with abdominal pain and fever. The patient was recently admitted 10/7-10/11 for non-operative mgmt of right ankle fracture and discharged to SNF. Over past couple of days his abdominal pain just progressively got worse and he had associated nausea and fever. In addition, patient appear more confused as per family member report.   In ED, patient was febrile with Tmax 101.74F, HR in 90's, RR 20, oxygen saturation in low 90s on 2 L nasal cannula oxygen support. Blood pressure was 106/56. Blood work showed white blood cell count of 30, lactate 3.36, mild elevation in troponin 0.26. Urinalysis was without pyuria or hematuria. Chest x-ray showed small lung volumes. CT head showed no acute intracranial findings. CT angiogram of the chest and abdomen showed left small pulmonary artery embolism as well as emphysematous cholecystitis versus fistula. Patient was given vancomycin, Zosyn on the admission for sepsis and IV fluids and started on heparin drip. He was seen by surgery in consultation who recommended percutaneous cholecystostomy tube placement by IR which was done 11/23.  Assessment & Plan:   Principal Problem:   Sepsis secondary to emphysematous cholecystitis / Leukocytosis / abdominal pain, nausea - Sepsis criteria met on the admission with fever, tachycardia, tachypnea, hypoxia, leukocytosis and lactic acidosis. Source of infection is emphysematous cholecystitis - Surgery recommended percutaneous cholecystostomy tube placement by  IR which was done 07/21/2016 - Continue zosyn for now - WBC count improving  - Lactic acid normalized - Continue to monitor in step down unit   Active Problems:   Acute metabolic encephalopathy - Secondary to sepsis - Still disoriented  - Continue to monitor mental status    Acute respiratory failure with hypoxia - Stable resp status but slight hypoxia earlier this morning at 2 AM 88%. - We will cut down IV fluids to 50 mL an hour - Obtain chest x-ray     Obesity due to excess calories - Body mass index is 40.16 kg/m. - Nutrition consulted     Diabetes mellitus with diabetic nephropathy with long-term insulin use  - Continue Levemir 20 units at bedtime along with sliding scale insulin  - CBG's in past 24 hours: 128, 104, 108    OSA on CPAP - Stable respiratory status    CKD stage 3 (HCC) - Cr 1.5 on admission, slightly above baseline value of 1.25 about one month ago - Elevated creatinine likely secondary to sepsis - Cr improving, 1.5 --> 1.4    Acute pulmonary embolism (HCC) - Continue heparin drip    DVT prophylaxis: Heparin drip  Code Status: full code  Family Communication: no family at the bedside this am Disposition Plan: remains in SDU   Consultants:   Surgery   Interventional radiology  Procedures:   Perc cholecystostomy 07/21/2016  LE doppler 11/23 - no DVT in right or left LE  Antimicrobials:   Zosyn 07/20/2016 -->  Vancomycin 1 dose given in ED    Subjective: No overnight events.   Objective: Vitals:   07/22/16 0200 07/22/16 0400 07/22/16 0600 07/22/16 0800  BP: (!) 107/43 (!) 116/37 Marland Kitchen)  114/43 (!) 90/52  Pulse:      Resp:  17 (!) 26 (!) 24  Temp:    99.7 F (37.6 C)  TempSrc:    Oral  SpO2: (!) 88% 96% 97% 100%  Weight:      Height:        Intake/Output Summary (Last 24 hours) at 07/22/16 1004 Last data filed at 07/22/16 0915  Gross per 24 hour  Intake          3983.78 ml  Output              660 ml  Net           3323.78 ml   Filed Weights   07/20/16 1100 07/20/16 1840  Weight: 117.9 kg (260 lb) 130.6 kg (287 lb 14.7 oz)    Examination:  General exam: Appears calm and comfortable, no distress  Respiratory system: Slight wheezing in upper lung lobes, no rhonchi  Cardiovascular system: S1 & S2 heard, Rate controlled  Gastrointestinal system: (+) BS, non tender abd  Central nervous system: Disoriented otherwise nonfocal  Extremities: Cast right leg, no edema LLE Skin: warm and dry Psychiatry: no agitation or restlessness .   Data Reviewed: I have personally reviewed following labs and imaging studies  CBC:  Recent Labs Lab 07/20/16 1126 07/21/16 0100 07/22/16 0305  WBC 30.1* 28.5* 20.9*  HGB 14.5 12.2* 11.7*  HCT 44.0 38.0* 36.7*  MCV 86.6 87.8 88.6  PLT 308 255 943   Basic Metabolic Panel:  Recent Labs Lab 07/20/16 1126 07/21/16 0100 07/22/16 0305  NA 133* 135 140  K 4.1 4.0 4.1  CL 97* 102 109  CO2 '23 27 26  ' GLUCOSE 255* 186* 141*  BUN 21* 27* 29*  CREATININE 1.58* 1.56* 1.48*  CALCIUM 9.1 8.2* 8.2*   GFR: Estimated Creatinine Clearance: 53 mL/min (by C-G formula based on SCr of 1.48 mg/dL (H)). Liver Function Tests:  Recent Labs Lab 07/20/16 1126 07/21/16 0100  AST 24 17  ALT 15* 13*  ALKPHOS 94 71  BILITOT 0.7 1.0  PROT 7.4 6.0*  ALBUMIN 3.8 2.8*   No results for input(s): LIPASE, AMYLASE in the last 168 hours. No results for input(s): AMMONIA in the last 168 hours. Coagulation Profile:  Recent Labs Lab 07/21/16 0100  INR 1.42   Cardiac Enzymes:  Recent Labs Lab 07/20/16 1126 07/20/16 2039  TROPONINI 0.26* <0.03   BNP (last 3 results) No results for input(s): PROBNP in the last 8760 hours. HbA1C: No results for input(s): HGBA1C in the last 72 hours. CBG:  Recent Labs Lab 07/21/16 1655 07/21/16 1949 07/21/16 2343 07/22/16 0457 07/22/16 0722  GLUCAP 134* 145* 128* 104* 108*   Lipid Profile: No results for input(s): CHOL, HDL,  LDLCALC, TRIG, CHOLHDL, LDLDIRECT in the last 72 hours. Thyroid Function Tests: No results for input(s): TSH, T4TOTAL, FREET4, T3FREE, THYROIDAB in the last 72 hours. Anemia Panel: No results for input(s): VITAMINB12, FOLATE, FERRITIN, TIBC, IRON, RETICCTPCT in the last 72 hours. Urine analysis:    Component Value Date/Time   COLORURINE AMBER (A) 07/20/2016 1151   APPEARANCEUR CLEAR 07/20/2016 1151   LABSPEC 1.026 07/20/2016 1151   PHURINE 5.5 07/20/2016 1151   GLUCOSEU 250 (A) 07/20/2016 1151   HGBUR NEGATIVE 07/20/2016 1151   BILIRUBINUR SMALL (A) 07/20/2016 1151   KETONESUR 15 (A) 07/20/2016 1151   PROTEINUR 30 (A) 07/20/2016 1151   UROBILINOGEN 1.0 11/22/2012 1633   NITRITE NEGATIVE 07/20/2016 1151   LEUKOCYTESUR NEGATIVE 07/20/2016  1151   Sepsis Labs: '@[email or web address]' (procalcitonin:4,lacticidven:4)   MRSA PCR Screening     Status: None   Collection Time: 07/20/16  5:46 PM  Result Value Ref Range Status   MRSA by PCR NEGATIVE NEGATIVE Final      Radiology Studies: Dg Chest 2 View Result Date: 07/20/2016 Low lung volumes with bibasilar atelectasis. Bibasilar pneumonia cannot be excluded. Small left pleural effusion.   Ct Head Wo Contrast Result Date: 07/20/2016 No acute abnormality.   Ct Angio Chest Pe W And/or Wo Contrast Result Date: 07/20/2016 Filling defect at the bifurcation of the LEFT lower lobe pulmonary artery consistent with a small age-indeterminate pulmonary embolus. Subsegmental atelectasis in BILATERAL lower lobes. Distended gallbladder with thickened wall, significant pericholecystic infiltrative changes, air within the gallbladder, and question minimal gas within the gallbladder wall. These findings could represent emphysematous cholecystitis or could be related to prior erosion of a gallstone from the gallbladder neck to the duodenum. Aortic atherosclerosis and mild coronary arterial calcification. BILATERAL inguinal hernias containing fat.   Ct Abdomen  Pelvis W Contrast Result Date: 07/20/2016 Filling defect at the bifurcation of the LEFT lower lobe pulmonary artery consistent with a small age-indeterminate pulmonary embolus. Subsegmental atelectasis in BILATERAL lower lobes. Distended gallbladder with thickened wall, significant pericholecystic infiltrative changes, air within the gallbladder, and question minimal gas within the gallbladder wall. These findings could represent emphysematous cholecystitis or could be related to prior erosion of a gallstone from the gallbladder neck to the duodenum. Aortic atherosclerosis and mild coronary arterial calcification. BILATERAL inguinal hernias containing fat.     Scheduled Meds: . docusate sodium  100 mg Oral BID        . insulin aspart  0-15 Units Subcutaneous Q4H  . insulin detemir  20 Units Subcutaneous QHS  . methocarbamol  750 mg Oral TID  . pantoprazole  40 mg Oral Daily  . piperacillin-tazobac  3.375 g Intravenous Q8H  . tamsulosin  0.4 mg Oral QPC supper   Continuous Infusions: . sodium chloride 50 mL/hr at 07/22/16 0800  . heparin 2,100 Units/hr (07/22/16 0428)     LOS: 2 days    Time spent: 25 minutes  Greater than 50% of the time spent on counseling and coordinating the care.   [name], MD [facility] Pager [phone]  If 7PM-7AM, please contact night-coverage [email or web address] Password [email or web address] 07/22/2016, 10:04 AM

## 2016-07-22 NOTE — Care Management Note (Signed)
Case Management Note  Patient Details  Name: [name] MRN: [id number] Date of Birth: 15-Apr-1932  Subjective/Objective:            cholelithiasis  With sepsis    Action/Plan: home Date:  July 22, 2016 Chart reviewed for concurrent status and case management needs. Will continue to follow patient progress. Discharge Planning: following for needs Expected discharge date: [id number] Velva Harman, BSN, [place], [place]  Expected Discharge Date:                  Expected Discharge Plan:  Home/Self Care  In-House Referral:     Discharge planning Services     Post Acute Care Choice:    Choice offered to:     DME Arranged:    DME Agency:     HH Arranged:    [place] Agency:     Status of Service:  In process, will continue to follow  If discussed at Long Length of Stay Meetings, dates discussed:    Additional Comments:  Leeroy Cha, RN 07/22/2016, 9:52 AM

## 2016-07-22 NOTE — Progress Notes (Signed)
ANTICOAGULATION CONSULT NOTE - Follow Up Consult  Pharmacy Consult for Heparin Indication: pulmonary embolus  No Known Allergies  Patient Measurements: Height: 5\' 11"  (180.3 cm) Weight: 287 lb 14.7 oz (130.6 kg) IBW/kg (Calculated) : 75.3 Heparin Dosing Weight:   Vital Signs: Temp: 97.5 F (36.4 C) (11/24 0000) Temp Source: Axillary (11/24 0000) BP: 107/43 (11/24 0200)  Labs:  Recent Labs  07/20/16 1126 07/20/16 2039  07/21/16 0100 07/21/16 0537 07/21/16 1900 07/22/16 0305  HGB 14.5  --   --  12.2*  --   --  11.7*  HCT 44.0  --   --  38.0*  --   --  36.7*  PLT 308  --   --  255  --   --  256  APTT  --   --   --  138*  --   --   --   LABPROT  --   --   --  17.4*  --   --   --   INR  --   --   --  1.42  --   --   --   HEPARINUNFRC  --   --   < > 0.40 0.27* 0.28* 0.31  CREATININE 1.58*  --   --  1.56*  --   --  1.48*  TROPONINI 0.26* <0.03  --   --   --   --   --   < > = values in this interval not displayed.  Estimated Creatinine Clearance: 53 mL/min (by C-G formula based on SCr of 1.48 mg/dL (H)).   Medications:  Infusions:  . sodium chloride 150 mL/hr at 07/22/16 0200  . heparin 2,000 Units/hr (07/21/16 2205)    Assessment: Patient with heparin level at goal. However at lower end of goal.  No heparin issues per RN.  Goal of Therapy:  Heparin level 0.3-0.7 units/ml Monitor platelets by anticoagulation protocol: Yes   Plan:  Increase heparin to 2100 units/hr Recheck level at [address], [place] 07/22/2016,4:29 AM

## 2016-07-22 NOTE — Progress Notes (Signed)
Pt's family here asking what pt was doing earlier when he was so agitated.  Pt's wife states that pt is normally fidgiity and restless, moves all the time.  Asked if pt had a history of ETOH use and they said he stopped drinking years ago.  Pt still fidgity while family in room.

## 2016-07-22 NOTE — Progress Notes (Signed)
Pharmacy Antibiotic Note  [name] is a 82 y.o. male admitted on 07/20/2016 with 1-2 days of abdominal pain.  Pharmacy has been consulted for zosyn for intra-abdominal dosing.    07/22/2016:  Day#3 abx  Afebrile  Leukocytosis improving  Renal function improving; CrCl >77ml/min  CXR + RUL PNA  Plan: Continue Zosyn 3.375g IV Q8H infused over 4hrs. No further dose adjustments anticipated- pharmacy will s/o.  Follow peripherally.   Height: 5\' 11"  (180.3 cm) Weight: 287 lb 14.7 oz (130.6 kg) IBW/kg (Calculated) : 75.3  Temp (24hrs), Avg:98.6 F (37 C), Min:97.5 F (36.4 C), Max:99.7 F (37.6 C)   Recent Labs Lab 07/20/16 1126 07/20/16 1148 07/20/16 2046 07/21/16 0100 07/22/16 0305  WBC 30.1*  --   --  28.5* 20.9*  CREATININE 1.58*  --   --  1.56* 1.48*  LATICACIDVEN  --  3.36* 1.4  --   --     Estimated Creatinine Clearance: 53 mL/min (by C-G formula based on SCr of 1.48 mg/dL (H)).    No Known Allergies  Antimicrobials this admission: 11/22 zosyn >> 11/22 vanc x 1 11/22 cefepime x1  Microbiology results: 11/22 BCx: sent 11/22 MRSA PCR: neg  Thank you for allowing pharmacy to be a part of this patient's care.  Netta Cedars, PharmD, BCPS Pager: [phone] 07/22/2016, 12:07 PM

## 2016-07-22 NOTE — Progress Notes (Signed)
ANTICOAGULATION CONSULT NOTE - Follow Up Consult  Pharmacy Consult for Heparin Indication: pulmonary embolus  No Known Allergies  Patient Measurements: Height: 5\' 11"  (180.3 cm) Weight: 287 lb 14.7 oz (130.6 kg) IBW/kg (Calculated) : 75.3 HEPARIN DW (KG): 105.1   Vital Signs: Temp: 97.9 F (36.6 C) (11/24 1200) Temp Source: Oral (11/24 1200) BP: 127/73 (11/24 1200)  Labs:  Recent Labs  07/20/16 1126 07/20/16 2039 07/21/16 0100  07/21/16 1900 07/22/16 0305 07/22/16 1342  HGB 14.5  --  12.2*  --   --  11.7*  --   HCT 44.0  --  38.0*  --   --  36.7*  --   PLT 308  --  255  --   --  256  --   APTT  --   --  138*  --   --   --   --   LABPROT  --   --  17.4*  --   --   --   --   INR  --   --  1.42  --   --   --   --   HEPARINUNFRC  --   --  0.40  < > 0.28* 0.31 0.22*  CREATININE 1.58*  --  1.56*  --   --  1.48*  --   TROPONINI 0.26* <0.03  --   --   --   --   --   < > = values in this interval not displayed.  Estimated Creatinine Clearance: 53 mL/min (by C-G formula based on SCr of 1.48 mg/dL (H)).   Medications:  Infusions:  . sodium chloride 50 mL/hr at 07/22/16 0800  . heparin 2,100 Units/hr (07/22/16 1128)    Assessment: 81 yo M at [facility] for rehab due to be discharged today. PMH HTN, CKD stage 3, DM, hospital 6 weeks ago w/ ankle fx, sent to SNF with cast.  At [facility] with hypotension, temp 101, WBC 30, elevated troponin, lactic acid > 3. CT angio possible LLL PE.  Pharmacy consulted to dose heparin No anticoagulants PTA.  07/22/2016:  Heparin level subtherapeutic on 2100 units/hr  CBC wnl.  No bleeding reported by RN  IV site continues to "kink"; no other infusion related issues per RN  Goal of Therapy:  Heparin level 0.3-0.7 units/ml Monitor platelets by anticoagulation protocol: Yes   Plan:  Increase heparin drip to 2400 units/hr Recheck 8h heparin level Daily heparin level & CBC F/U long-term anticoagulation plans  Netta Cedars, PharmD, BCPS Pager: [phone] 07/22/2016,2:15 PM

## 2016-07-22 NOTE — Progress Notes (Signed)
Subjective: Less pain. Not much appetite  Objective: Vital signs in last 24 hours: Temp:  [97.5 F (36.4 C)-99.7 F (37.6 C)] 97.9 F (36.6 C) (11/24 1200) Resp:  [14-26] 21 (11/24 1200) BP: (90-148)/(37-73) 127/73 (11/24 1200) SpO2:  [84 %-100 %] 92 % (11/24 1200) Last BM Date: 07/19/16  Intake/Output from previous day: 11/23 0701 - 11/24 0700 In: 4202.9 [P.O.:100; I.V.:3952.9; IV Piggyback:150] Out: 760 [Urine:550; Drains:210] Intake/Output this shift: Total I/O In: 676 [I.V.:626; IV Piggyback:50] Out: 450 [Urine:450]  Awake, alert,  Soft, obese, mild TTP in RUQ; gb drain intact  Lab Results:   Recent Labs  07/21/16 0100 07/22/16 0305  WBC 28.5* 20.9*  HGB 12.2* 11.7*  HCT 38.0* 36.7*  PLT 255 256   BMET  Recent Labs  07/21/16 0100 07/22/16 0305  NA 135 140  K 4.0 4.1  CL 102 109  CO2 27 26  GLUCOSE 186* 141*  BUN 27* 29*  CREATININE 1.56* 1.48*  CALCIUM 8.2* 8.2*   PT/INR  Recent Labs  07/21/16 0100  LABPROT 17.4*  INR 1.42   ABG  Recent Labs  07/20/16 1148  HCO3 28.2*    Studies/Results: Dg Chest 2 View  Result Date: 07/20/2016 CLINICAL DATA:  Cough. EXAM: CHEST  2 VIEW COMPARISON:  06/05/2016. FINDINGS: Mediastinum and hilar structures are normal. Heart size normal. Low lung volumes with bibasilar atelectasis. Bibasilar pneumonia cannot be excluded. Small left pleural effusion. No pneumothorax. IMPRESSION: Low lung volumes with bibasilar atelectasis. Bibasilar pneumonia cannot be excluded. Small left pleural effusion. Electronically Signed   By: Marcello Moores  Register   On: 07/20/2016 12:34   Ct Head Wo Contrast  Result Date: 07/20/2016 CLINICAL DATA:  Altered mental status today.  Initial encounter. EXAM: CT HEAD WITHOUT CONTRAST TECHNIQUE: Contiguous axial images were obtained from the base of the skull through the vertex without intravenous contrast. COMPARISON:  None. FINDINGS: Brain: There is some chronic microvascular ischemic  change. No evidence of acute abnormality including infarct, hemorrhage, mass lesion, mass effect, midline shift or abnormal extra-axial fluid collection. No hydrocephalus or pneumocephalus. Vascular: Atherosclerosis noted. Skull: Intact. Sinuses/Orbits: Negative. Other: None. IMPRESSION: No acute abnormality. Electronically Signed   By: Inge Rise M.D.   On: 07/20/2016 14:53   Ct Angio Chest Pe W And/or Wo Contrast  Result Date: 07/20/2016 CLINICAL DATA:  Recent ankle fracture, hospitalized, cast on RIGHT leg, today not breathing RIGHT, altered mental status, abdominal pain since yesterday, history diabetes mellitus, hypertension, stage III chronic kidney disease EXAM: CT ANGIOGRAPHY CHEST CT ABDOMEN AND PELVIS WITH CONTRAST TECHNIQUE: Multidetector CT imaging of the chest was performed using the standard protocol during bolus administration of intravenous contrast. Multiplanar CT image reconstructions and MIPs were obtained to evaluate the vascular anatomy. Multidetector CT imaging of the abdomen and pelvis was performed using the standard protocol during bolus administration of intravenous contrast. CONTRAST:  80 mL of Isovue 370 IV.  No oral contrast. COMPARISON:  CTA chest 02/11/2016, CT abdomen pelvis 11/17/2012 FINDINGS: CTA CHEST FINDINGS Cardiovascular: Atherosclerotic calcifications aorta and coronary arteries. Aorta normal caliber without aneurysm or dissection. Dilated central pulmonary arteries question pulmonary arterial hypertension. Single small filling defect identified in within the LEFT lower lobe pulmonary artery at its bifurcation compatible with a small age-indeterminate pulmonary embolus. No additional pulmonary emboli identified. No pericardial effusion. Mediastinum/Nodes: Base of cervical region unremarkable. No definite esophageal abnormalities. No thoracic adenopathy. Lungs/Pleura: Subsegmental atelectasis in BILATERAL lower lobes greater on RIGHT. No pulmonary infiltrate,  pleural effusion, or pneumothorax. Musculoskeletal:  Unremarkable Review of the MIP images confirms the above findings. CT ABDOMEN and PELVIS FINDINGS Hepatobiliary: Normal appearing liver. No biliary dilatation or biliary air. Thickened gallbladder wall with extensive pericholecystic infiltrative changes. Air within gallbladder lumen both at the fundus and at the neck/cystic duct extending to adjacent to the duodenal wall. Additional foci of gas are seen at the lower gallbladder segment, question intraluminal versus within wall. This could either represent emphysematous cholecystitis or prior erosion of a gallstone from the gallbladder neck into the duodenum. No gallstone identified within bowel nor evidence of gallstone ileus. Pancreas: Atrophic pancreas without mass Spleen: Normal appearance Adrenals/Urinary Tract: Tiny exophytic cyst at the inferomedial LEFT kidney 1.6 x 1.3 cm image 52. Adrenal glands, kidneys, and ureters otherwise normal appearance. Bladder decompressed. Unremarkable prostate gland. Stomach/Bowel: Mild edema adjacent to the wall of the second portion of the duodenum extending to the proximal third duodenum with only minimal wall thickening; this is felt to be secondary to the adjacent gallbladder process rather than a primary duodenal process. Normal appendix. Scattered distal colonic diverticulosis. Stomach and bowel loops otherwise normal appearance. Vascular/Lymphatic: Atherosclerotic calcifications aorta and iliac arteries, including at the proximal SMA and celiac artery as well as the renal artery origins. No significant adenopathy Reproductive: N/A Other: No free air or free fluid. Small BILATERAL inguinal hernias containing fat. Musculoskeletal: Osseous structures unremarkable. Review of the MIP images confirms the above findings. IMPRESSION: Filling defect at the bifurcation of the LEFT lower lobe pulmonary artery consistent with a small age-indeterminate pulmonary embolus.  Subsegmental atelectasis in BILATERAL lower lobes. Distended gallbladder with thickened wall, significant pericholecystic infiltrative changes, air within the gallbladder, and question minimal gas within the gallbladder wall. These findings could represent emphysematous cholecystitis or could be related to prior erosion of a gallstone from the gallbladder neck to the duodenum. Aortic atherosclerosis and mild coronary arterial calcification. BILATERAL inguinal hernias containing fat. Findings called to Dr. Dayna Barker On 07/20/2016 at 1533 hours. Electronically Signed   By: Lavonia Dana M.D.   On: 07/20/2016 15:34   Ct Abdomen Pelvis W Contrast  Result Date: 07/20/2016 CLINICAL DATA:  Recent ankle fracture, hospitalized, cast on RIGHT leg, today not breathing RIGHT, altered mental status, abdominal pain since yesterday, history diabetes mellitus, hypertension, stage III chronic kidney disease EXAM: CT ANGIOGRAPHY CHEST CT ABDOMEN AND PELVIS WITH CONTRAST TECHNIQUE: Multidetector CT imaging of the chest was performed using the standard protocol during bolus administration of intravenous contrast. Multiplanar CT image reconstructions and MIPs were obtained to evaluate the vascular anatomy. Multidetector CT imaging of the abdomen and pelvis was performed using the standard protocol during bolus administration of intravenous contrast. CONTRAST:  80 mL of Isovue 370 IV.  No oral contrast. COMPARISON:  CTA chest 02/11/2016, CT abdomen pelvis 11/17/2012 FINDINGS: CTA CHEST FINDINGS Cardiovascular: Atherosclerotic calcifications aorta and coronary arteries. Aorta normal caliber without aneurysm or dissection. Dilated central pulmonary arteries question pulmonary arterial hypertension. Single small filling defect identified in within the LEFT lower lobe pulmonary artery at its bifurcation compatible with a small age-indeterminate pulmonary embolus. No additional pulmonary emboli identified. No pericardial effusion.  Mediastinum/Nodes: Base of cervical region unremarkable. No definite esophageal abnormalities. No thoracic adenopathy. Lungs/Pleura: Subsegmental atelectasis in BILATERAL lower lobes greater on RIGHT. No pulmonary infiltrate, pleural effusion, or pneumothorax. Musculoskeletal: Unremarkable Review of the MIP images confirms the above findings. CT ABDOMEN and PELVIS FINDINGS Hepatobiliary: Normal appearing liver. No biliary dilatation or biliary air. Thickened gallbladder wall with extensive pericholecystic infiltrative changes. Air within gallbladder  lumen both at the fundus and at the neck/cystic duct extending to adjacent to the duodenal wall. Additional foci of gas are seen at the lower gallbladder segment, question intraluminal versus within wall. This could either represent emphysematous cholecystitis or prior erosion of a gallstone from the gallbladder neck into the duodenum. No gallstone identified within bowel nor evidence of gallstone ileus. Pancreas: Atrophic pancreas without mass Spleen: Normal appearance Adrenals/Urinary Tract: Tiny exophytic cyst at the inferomedial LEFT kidney 1.6 x 1.3 cm image 52. Adrenal glands, kidneys, and ureters otherwise normal appearance. Bladder decompressed. Unremarkable prostate gland. Stomach/Bowel: Mild edema adjacent to the wall of the second portion of the duodenum extending to the proximal third duodenum with only minimal wall thickening; this is felt to be secondary to the adjacent gallbladder process rather than a primary duodenal process. Normal appendix. Scattered distal colonic diverticulosis. Stomach and bowel loops otherwise normal appearance. Vascular/Lymphatic: Atherosclerotic calcifications aorta and iliac arteries, including at the proximal SMA and celiac artery as well as the renal artery origins. No significant adenopathy Reproductive: N/A Other: No free air or free fluid. Small BILATERAL inguinal hernias containing fat. Musculoskeletal: Osseous structures  unremarkable. Review of the MIP images confirms the above findings. IMPRESSION: Filling defect at the bifurcation of the LEFT lower lobe pulmonary artery consistent with a small age-indeterminate pulmonary embolus. Subsegmental atelectasis in BILATERAL lower lobes. Distended gallbladder with thickened wall, significant pericholecystic infiltrative changes, air within the gallbladder, and question minimal gas within the gallbladder wall. These findings could represent emphysematous cholecystitis or could be related to prior erosion of a gallstone from the gallbladder neck to the duodenum. Aortic atherosclerosis and mild coronary arterial calcification. BILATERAL inguinal hernias containing fat. Findings called to Dr. Dayna Barker On 07/20/2016 at 1533 hours. Electronically Signed   By: Lavonia Dana M.D.   On: 07/20/2016 15:34   Ir Perc Cholecystostomy  Result Date: 07/21/2016 INDICATION: ACUTE CHOLECYSTITIS EXAM: CHOLECYSTOSTOMY MEDICATIONS: ZOSYN 3.375 G; The antibiotic was administered within an appropriate time frame prior to the initiation of the procedure. ANESTHESIA/SEDATION: Moderate (conscious) sedation was employed during this procedure. A total of Versed 2.0 mg and Fentanyl 50 mcg was administered intravenously. Moderate Sedation Time: 15 minutes. The patient's level of consciousness and vital signs were monitored continuously by radiology nursing throughout the procedure under my direct supervision. FLUOROSCOPY TIME:  Fluoroscopy Time:  36 seconds (36 mGy). COMPLICATIONS: None immediate. PROCEDURE: Informed written consent was obtained from the patient after a thorough discussion of the procedural risks, benefits and alternatives. All questions were addressed. Maximal Sterile Barrier Technique was utilized including caps, mask, sterile gowns, sterile gloves, sterile drape, hand hygiene and skin antiseptic. A timeout was performed prior to the initiation of the procedure. Previous imaging reviewed.  Preliminary ultrasound performed. Gallbladder localized in the right upper quadrant in the mid axillary line through a lower intercostal space. Under sterile conditions and local anesthesia, ultrasound percutaneous transhepatic needle access performed of the distended gallbladder. Needle position confirmed with ultrasound. There was return of bile. Guidewire inserted followed by Accustick dilator set. Amplatz guidewire advanced into the gallbladder. Tract dilatation performed to insert a 10 [place] drain. Retention loop formed in the gallbladder. Position confirmed with fluoroscopy. Images obtained for documentation. Catheter secured with a Prolene suture and sterile dressing. Catheter connected to external gravity drainage bag. IMPRESSION: Successful ultrasound and fluoroscopic 10 French percutaneous cholecystostomy as above. Electronically Signed   By: Jerilynn Mages.  Shick M.D.   On: 07/21/2016 11:33   Dg Chest [facility] 1 View  Result  Date: 07/22/2016 CLINICAL DATA:  Cough and wheezing EXAM: PORTABLE CHEST 1 VIEW COMPARISON:  Chest radiograph and chest CT July 20, 2016 FINDINGS: There is new patchy airspace consolidation in the right lower lobe. Lungs elsewhere are clear. Heart is borderline enlarged with pulmonary vascularity within normal limits. No adenopathy. There is degenerative change in each shoulder. There is atherosclerotic calcification in the aorta. IMPRESSION: Patchy airspace disease consistent with a degree of pneumonia in the right upper lobe. Lungs elsewhere clear. Heart borderline enlarged. Aortic atherosclerosis. Electronically Signed   By: Lowella Grip III M.D.   On: 07/22/2016 10:49    Anti-infectives: Anti-infectives    Start     Dose/Rate Route Frequency Ordered Stop   07/21/16 0000  piperacillin-tazobactam (ZOSYN) IVPB 3.375 g     3.375 g 12.5 mL/hr over 240 Minutes Intravenous Every 8 hours 07/20/16 2021     07/20/16 1545  piperacillin-tazobactam (ZOSYN) IVPB 3.375 g     3.375  g 100 mL/hr over 30 Minutes Intravenous  Once 07/20/16 1535 07/20/16 1702   07/20/16 1311  vancomycin (VANCOCIN) 1-5 GM/200ML-% IVPB    Comments:  Woodward Ku   : cabinet override      07/20/16 1311 07/21/16 0114   07/20/16 1245  vancomycin (VANCOCIN) IVPB 1000 mg/200 mL premix     1,000 mg 200 mL/hr over 60 Minutes Intravenous Every 1 hr x 2 07/20/16 1234 07/20/16 1444   07/20/16 1232  ceFEPIme (MAXIPIME) 1 g injection    Comments:  Eulogio Ditch   : cabinet override      07/20/16 1232 07/21/16 0044   07/20/16 1230  vancomycin (VANCOCIN) 1,769 mg in sodium chloride 0.9 % 500 mL IVPB  Status:  Discontinued     15 mg/kg  117.9 kg 250 mL/hr over 120 Minutes Intravenous  Once 07/20/16 1228 07/20/16 1234   07/20/16 1230  ceFEPIme (MAXIPIME) 1 g in dextrose 5 % 50 mL IVPB     1 g 100 mL/hr over 30 Minutes Intravenous  Once 07/20/16 1228 07/20/16 1305      Assessment/Plan: s/p * No surgery found * Severe cholecystitis s/p perc cholecystostomy tube 11/23  F/u cx Diet as tolerated. Expect appetite to be off some, nutritional shakes Cont iv abx Cont drain   Leighton Ruff. Redmond Pulling, MD, FACS General, Bariatric, & Minimally Invasive Surgery [facility], [place]   LOS: 2 days    Gayland Curry 07/22/2016

## 2016-07-22 NOTE — Progress Notes (Signed)
Patient ID: [name], male   DOB: [birth year], 82 y.o.   MRN: [id number]    Referring Physician(s): Dr. Greer Pickerel  Supervising Physician: Corrie Mckusick  Patient Status: [facility] - In-pt  Chief Complaint: cholecystitis  Subjective: Pt is confused.  Says his pain is better though.  Didn't realize he had a drain in place.  Allergies: Patient has no known allergies.  Medications: Prior to Admission medications   Medication Sig Start Date End Date Taking? Authorizing Provider  carvedilol (COREG) 3.125 MG tablet Take 3.125 mg by mouth 2 (two) times daily with a meal.   Yes Historical Provider, MD  cyanocobalamin 500 MCG tablet Take 500 mcg by mouth 2 (two) times daily.   Yes Historical Provider, MD  docusate sodium (COLACE) 100 MG capsule Take 1 capsule (100 mg total) by mouth 2 (two) times daily. While taking narcotic pain medicine. Patient taking differently: Take 100 mg by mouth 2 (two) times daily.  02/20/15  Yes Corky Sing, PA-C  Ergocalciferol (VITAMIN D2) 2000 UNITS TABS Take 2,000 Units by mouth 2 (two) times daily.    Yes Historical Provider, MD  galantamine (RAZADYNE ER) 16 MG 24 hr capsule Take 16 mg by mouth daily with breakfast.   Yes Historical Provider, MD  HYDROmorphone (DILAUDID) 2 MG tablet Take 1 tablet (2 mg total) by mouth every 3 (three) hours as needed for severe pain. 06/08/16  Yes Velvet Bathe, MD  Hypromellose (GENTEAL MILD TO MODERATE) 0.3 % SOLN Apply 1 drop to eye 2 (two) times daily.   Yes Historical Provider, MD  insulin aspart (NOVOLOG) 100 UNIT/ML injection Inject 0-15 Units into the skin 3 (three) times daily with meals. Per sensitive sliding scale Patient taking differently: Inject 0-15 Units into the skin 3 (three) times daily with meals as needed for high blood sugar. Less than 60=Call MD 101-150=3 units 151-200=4 201-250=9 301-350=12 Over 350= 15 Over 400=Call MD 11/21/12  Yes Domenic Polite, MD  insulin detemir (LEVEMIR) 100 UNIT/ML  injection Inject 42 Units into the skin at bedtime.   Yes Historical Provider, MD  isosorbide mononitrate (IMDUR) 30 MG 24 hr tablet Take 30 mg by mouth every morning.    Yes Historical Provider, MD  magnesium oxide (MAG-OX) 400 MG tablet Take 400 mg by mouth 2 (two) times daily.   Yes Historical Provider, MD  methocarbamol (ROBAXIN) 750 MG tablet Take 1 tablet by mouth every 6 (six) hours as needed for muscle spasms.  11/21/15  Yes Historical Provider, MD  Multiple Vitamins-Minerals (PRESERVISION AREDS 2 PO) Take 1 capsule by mouth 2 (two) times daily.   Yes Historical Provider, MD  pantoprazole (PROTONIX) 40 MG tablet Take 1 tablet (40 mg total) by mouth daily. Patient taking differently: Take 40 mg by mouth 2 (two) times daily.  12/20/12  Yes [name], DO  polyethylene glycol (MIRALAX / GLYCOLAX) packet Take 17 g by mouth daily.   Yes Historical Provider, MD  QUEtiapine (SEROQUEL) 50 MG tablet Take 50 mg by mouth at bedtime.   Yes Historical Provider, MD  simvastatin (ZOCOR) 40 MG tablet Take 40 mg by mouth every evening.   Yes Historical Provider, MD  tamsulosin (FLOMAX) 0.4 MG CAPS capsule Take 1 capsule (0.4 mg total) by mouth daily after supper. 02/15/16  Yes Barton Dubois, MD    Vital Signs: BP 135/71   Pulse 89   Temp 97.9 F (36.6 C) (Oral)   Resp 20   Ht 5\' 11"  (1.803 m)   Wt  287 lb 14.7 oz (130.6 kg)   SpO2 92%   BMI 40.16 kg/m   Physical Exam: Abd: soft, still mildly tender in RUQ, drain in place with bilious output, +BS  Imaging: Dg Chest 2 View  Result Date: 07/20/2016 CLINICAL DATA:  Cough. EXAM: CHEST  2 VIEW COMPARISON:  06/05/2016. FINDINGS: Mediastinum and hilar structures are normal. Heart size normal. Low lung volumes with bibasilar atelectasis. Bibasilar pneumonia cannot be excluded. Small left pleural effusion. No pneumothorax. IMPRESSION: Low lung volumes with bibasilar atelectasis. Bibasilar pneumonia cannot be excluded. Small left pleural effusion.  Electronically Signed   By: Marcello Moores  Register   On: 07/20/2016 12:34   Ct Head Wo Contrast  Result Date: 07/20/2016 CLINICAL DATA:  Altered mental status today.  Initial encounter. EXAM: CT HEAD WITHOUT CONTRAST TECHNIQUE: Contiguous axial images were obtained from the base of the skull through the vertex without intravenous contrast. COMPARISON:  None. FINDINGS: Brain: There is some chronic microvascular ischemic change. No evidence of acute abnormality including infarct, hemorrhage, mass lesion, mass effect, midline shift or abnormal extra-axial fluid collection. No hydrocephalus or pneumocephalus. Vascular: Atherosclerosis noted. Skull: Intact. Sinuses/Orbits: Negative. Other: None. IMPRESSION: No acute abnormality. Electronically Signed   By: Inge Rise M.D.   On: 07/20/2016 14:53   Ct Angio Chest Pe W And/or Wo Contrast  Result Date: 07/20/2016 CLINICAL DATA:  Recent ankle fracture, hospitalized, cast on RIGHT leg, today not breathing RIGHT, altered mental status, abdominal pain since yesterday, history diabetes mellitus, hypertension, stage III chronic kidney disease EXAM: CT ANGIOGRAPHY CHEST CT ABDOMEN AND PELVIS WITH CONTRAST TECHNIQUE: Multidetector CT imaging of the chest was performed using the standard protocol during bolus administration of intravenous contrast. Multiplanar CT image reconstructions and MIPs were obtained to evaluate the vascular anatomy. Multidetector CT imaging of the abdomen and pelvis was performed using the standard protocol during bolus administration of intravenous contrast. CONTRAST:  80 mL of Isovue 370 IV.  No oral contrast. COMPARISON:  CTA chest 02/11/2016, CT abdomen pelvis 11/17/2012 FINDINGS: CTA CHEST FINDINGS Cardiovascular: Atherosclerotic calcifications aorta and coronary arteries. Aorta normal caliber without aneurysm or dissection. Dilated central pulmonary arteries question pulmonary arterial hypertension. Single small filling defect identified in  within the LEFT lower lobe pulmonary artery at its bifurcation compatible with a small age-indeterminate pulmonary embolus. No additional pulmonary emboli identified. No pericardial effusion. Mediastinum/Nodes: Base of cervical region unremarkable. No definite esophageal abnormalities. No thoracic adenopathy. Lungs/Pleura: Subsegmental atelectasis in BILATERAL lower lobes greater on RIGHT. No pulmonary infiltrate, pleural effusion, or pneumothorax. Musculoskeletal: Unremarkable Review of the MIP images confirms the above findings. CT ABDOMEN and PELVIS FINDINGS Hepatobiliary: Normal appearing liver. No biliary dilatation or biliary air. Thickened gallbladder wall with extensive pericholecystic infiltrative changes. Air within gallbladder lumen both at the fundus and at the neck/cystic duct extending to adjacent to the duodenal wall. Additional foci of gas are seen at the lower gallbladder segment, question intraluminal versus within wall. This could either represent emphysematous cholecystitis or prior erosion of a gallstone from the gallbladder neck into the duodenum. No gallstone identified within bowel nor evidence of gallstone ileus. Pancreas: Atrophic pancreas without mass Spleen: Normal appearance Adrenals/Urinary Tract: Tiny exophytic cyst at the inferomedial LEFT kidney 1.6 x 1.3 cm image 52. Adrenal glands, kidneys, and ureters otherwise normal appearance. Bladder decompressed. Unremarkable prostate gland. Stomach/Bowel: Mild edema adjacent to the wall of the second portion of the duodenum extending to the proximal third duodenum with only minimal wall thickening; this is felt to be secondary  to the adjacent gallbladder process rather than a primary duodenal process. Normal appendix. Scattered distal colonic diverticulosis. Stomach and bowel loops otherwise normal appearance. Vascular/Lymphatic: Atherosclerotic calcifications aorta and iliac arteries, including at the proximal SMA and celiac artery as well  as the renal artery origins. No significant adenopathy Reproductive: N/A Other: No free air or free fluid. Small BILATERAL inguinal hernias containing fat. Musculoskeletal: Osseous structures unremarkable. Review of the MIP images confirms the above findings. IMPRESSION: Filling defect at the bifurcation of the LEFT lower lobe pulmonary artery consistent with a small age-indeterminate pulmonary embolus. Subsegmental atelectasis in BILATERAL lower lobes. Distended gallbladder with thickened wall, significant pericholecystic infiltrative changes, air within the gallbladder, and question minimal gas within the gallbladder wall. These findings could represent emphysematous cholecystitis or could be related to prior erosion of a gallstone from the gallbladder neck to the duodenum. Aortic atherosclerosis and mild coronary arterial calcification. BILATERAL inguinal hernias containing fat. Findings called to Dr. Dayna Barker On 07/20/2016 at 1533 hours. Electronically Signed   By: Lavonia Dana M.D.   On: 07/20/2016 15:34   Ct Abdomen Pelvis W Contrast  Result Date: 07/20/2016 CLINICAL DATA:  Recent ankle fracture, hospitalized, cast on RIGHT leg, today not breathing RIGHT, altered mental status, abdominal pain since yesterday, history diabetes mellitus, hypertension, stage III chronic kidney disease EXAM: CT ANGIOGRAPHY CHEST CT ABDOMEN AND PELVIS WITH CONTRAST TECHNIQUE: Multidetector CT imaging of the chest was performed using the standard protocol during bolus administration of intravenous contrast. Multiplanar CT image reconstructions and MIPs were obtained to evaluate the vascular anatomy. Multidetector CT imaging of the abdomen and pelvis was performed using the standard protocol during bolus administration of intravenous contrast. CONTRAST:  80 mL of Isovue 370 IV.  No oral contrast. COMPARISON:  CTA chest 02/11/2016, CT abdomen pelvis 11/17/2012 FINDINGS: CTA CHEST FINDINGS Cardiovascular: Atherosclerotic  calcifications aorta and coronary arteries. Aorta normal caliber without aneurysm or dissection. Dilated central pulmonary arteries question pulmonary arterial hypertension. Single small filling defect identified in within the LEFT lower lobe pulmonary artery at its bifurcation compatible with a small age-indeterminate pulmonary embolus. No additional pulmonary emboli identified. No pericardial effusion. Mediastinum/Nodes: Base of cervical region unremarkable. No definite esophageal abnormalities. No thoracic adenopathy. Lungs/Pleura: Subsegmental atelectasis in BILATERAL lower lobes greater on RIGHT. No pulmonary infiltrate, pleural effusion, or pneumothorax. Musculoskeletal: Unremarkable Review of the MIP images confirms the above findings. CT ABDOMEN and PELVIS FINDINGS Hepatobiliary: Normal appearing liver. No biliary dilatation or biliary air. Thickened gallbladder wall with extensive pericholecystic infiltrative changes. Air within gallbladder lumen both at the fundus and at the neck/cystic duct extending to adjacent to the duodenal wall. Additional foci of gas are seen at the lower gallbladder segment, question intraluminal versus within wall. This could either represent emphysematous cholecystitis or prior erosion of a gallstone from the gallbladder neck into the duodenum. No gallstone identified within bowel nor evidence of gallstone ileus. Pancreas: Atrophic pancreas without mass Spleen: Normal appearance Adrenals/Urinary Tract: Tiny exophytic cyst at the inferomedial LEFT kidney 1.6 x 1.3 cm image 52. Adrenal glands, kidneys, and ureters otherwise normal appearance. Bladder decompressed. Unremarkable prostate gland. Stomach/Bowel: Mild edema adjacent to the wall of the second portion of the duodenum extending to the proximal third duodenum with only minimal wall thickening; this is felt to be secondary to the adjacent gallbladder process rather than a primary duodenal process. Normal appendix. Scattered  distal colonic diverticulosis. Stomach and bowel loops otherwise normal appearance. Vascular/Lymphatic: Atherosclerotic calcifications aorta and iliac arteries, including at the proximal  SMA and celiac artery as well as the renal artery origins. No significant adenopathy Reproductive: N/A Other: No free air or free fluid. Small BILATERAL inguinal hernias containing fat. Musculoskeletal: Osseous structures unremarkable. Review of the MIP images confirms the above findings. IMPRESSION: Filling defect at the bifurcation of the LEFT lower lobe pulmonary artery consistent with a small age-indeterminate pulmonary embolus. Subsegmental atelectasis in BILATERAL lower lobes. Distended gallbladder with thickened wall, significant pericholecystic infiltrative changes, air within the gallbladder, and question minimal gas within the gallbladder wall. These findings could represent emphysematous cholecystitis or could be related to prior erosion of a gallstone from the gallbladder neck to the duodenum. Aortic atherosclerosis and mild coronary arterial calcification. BILATERAL inguinal hernias containing fat. Findings called to Dr. Dayna Barker On 07/20/2016 at 1533 hours. Electronically Signed   By: Lavonia Dana M.D.   On: 07/20/2016 15:34   Ir Perc Cholecystostomy  Result Date: 07/21/2016 INDICATION: ACUTE CHOLECYSTITIS EXAM: CHOLECYSTOSTOMY MEDICATIONS: ZOSYN 3.375 G; The antibiotic was administered within an appropriate time frame prior to the initiation of the procedure. ANESTHESIA/SEDATION: Moderate (conscious) sedation was employed during this procedure. A total of Versed 2.0 mg and Fentanyl 50 mcg was administered intravenously. Moderate Sedation Time: 15 minutes. The patient's level of consciousness and vital signs were monitored continuously by radiology nursing throughout the procedure under my direct supervision. FLUOROSCOPY TIME:  Fluoroscopy Time:  36 seconds (36 mGy). COMPLICATIONS: None immediate. PROCEDURE: Informed  written consent was obtained from the patient after a thorough discussion of the procedural risks, benefits and alternatives. All questions were addressed. Maximal Sterile Barrier Technique was utilized including caps, mask, sterile gowns, sterile gloves, sterile drape, hand hygiene and skin antiseptic. A timeout was performed prior to the initiation of the procedure. Previous imaging reviewed. Preliminary ultrasound performed. Gallbladder localized in the right upper quadrant in the mid axillary line through a lower intercostal space. Under sterile conditions and local anesthesia, ultrasound percutaneous transhepatic needle access performed of the distended gallbladder. Needle position confirmed with ultrasound. There was return of bile. Guidewire inserted followed by Accustick dilator set. Amplatz guidewire advanced into the gallbladder. Tract dilatation performed to insert a 10 [place] drain. Retention loop formed in the gallbladder. Position confirmed with fluoroscopy. Images obtained for documentation. Catheter secured with a Prolene suture and sterile dressing. Catheter connected to external gravity drainage bag. IMPRESSION: Successful ultrasound and fluoroscopic 10 French percutaneous cholecystostomy as above. Electronically Signed   By: Jerilynn Mages.  Shick M.D.   On: 07/21/2016 11:33   Dg Chest Port 1 View  Result Date: 07/22/2016 CLINICAL DATA:  Cough and wheezing EXAM: PORTABLE CHEST 1 VIEW COMPARISON:  Chest radiograph and chest CT July 20, 2016 FINDINGS: There is new patchy airspace consolidation in the right lower lobe. Lungs elsewhere are clear. Heart is borderline enlarged with pulmonary vascularity within normal limits. No adenopathy. There is degenerative change in each shoulder. There is atherosclerotic calcification in the aorta. IMPRESSION: Patchy airspace disease consistent with a degree of pneumonia in the right upper lobe. Lungs elsewhere clear. Heart borderline enlarged. Aortic atherosclerosis.  Electronically Signed   By: Lowella Grip III M.D.   On: 07/22/2016 10:49    Labs:  CBC:  Recent Labs  06/08/16 0451 07/20/16 1126 07/21/16 0100 07/22/16 0305  WBC 11.5* 30.1* 28.5* 20.9*  HGB 11.6* 14.5 12.2* 11.7*  HCT 35.4* 44.0 38.0* 36.7*  PLT 259 308 255 256    COAGS:  Recent Labs  07/21/16 0100  INR 1.42  APTT 138*    BMP:  Recent Labs  06/08/16 0451 07/20/16 1126 07/21/16 0100 07/22/16 0305  NA 139 133* 135 140  K 4.7 4.1 4.0 4.1  CL 101 97* 102 109  CO2 29 23 27 26   GLUCOSE 153* 255* 186* 141*  BUN 32* 21* 27* 29*  CALCIUM 8.9 9.1 8.2* 8.2*  CREATININE 1.25* 1.58* 1.56* 1.48*  GFRNONAA 52* 39* 40* 42*  GFRAA >60 45* 46* 49*    LIVER FUNCTION TESTS:  Recent Labs  06/04/16 1706 06/05/16 0448 07/20/16 1126 07/21/16 0100  BILITOT 0.5 0.5 0.7 1.0  AST 20 18 24 17   ALT 15* 14* 15* 13*  ALKPHOS 80 84 94 71  PROT 6.9 6.8 7.4 6.0*  ALBUMIN 3.6 3.7 3.8 2.8*    Assessment and Plan: 1. Acute cholecystitis, s/p perc drain on 11/23, Shick -cont with perc chole drain.   -pain improved -drain will stay in for 6-8 weeks, then plans for surgical intervention will be determined at that time per general surgery.  Electronically Signed: Henreitta Cea 07/22/2016, 3:21 PM   I spent a total of 15 Minutes at the the patient's bedside AND on the patient's hospital floor or unit, greater than 50% of which was counseling/coordinating care for acute cholecystitis

## 2016-07-22 NOTE — Clinical Social Work Note (Signed)
Clinical Social Work Assessment  Patient Details  Name: [name] MRN: [id number] Date of Birth: 15-Jul-1932  Date of referral:  07/22/16               Reason for consult:  Discharge Planning                Permission sought to share information with:  Facility Art therapist granted to share information::  Yes, Verbal Permission Granted  Name::        Agency::     Relationship::     Contact Information:     Housing/Transportation Living arrangements for the past 2 months:  [place] of Information:  Spouse [name]) Patient Interpreter Needed:  None Criminal Activity/Legal Involvement Pertinent to Current Situation/Hospitalization:    Significant Relationships:  Spouse Engineer, manufacturing) Lives with:  Spouse Do you feel safe going back to the place where you live?    Need for family participation in patient care:  Yes (Comment)  Care giving concerns:  CSW received consult to speak with patient regarding current discharge plans.     Social Worker assessment / plan:  CSW contacted patients spouse, [name], regarding current discharge plans. Patient was recently admitted for a broken ankle where he went to [facility] for short-term rehab. Patients spouse is unsure of discharge plan and is unsure is skilled nursing would be best at the moment. Patient spouse stated " I am taking this day by day". CSW will continue to update.   Employment status:  Retired Forensic scientist:  Medicare PT Recommendations:  Not assessed at this time Information / Referral to community resources:     Patient/Family's Response to care:  Patients spouse appreciated CSW.   Patient/Family's Understanding of and Emotional Response to Diagnosis, Current Treatment, and Prognosis:  Unknown at this time.   Emotional Assessment Appearance:    Attitude/Demeanor/Rapport:  Unable to Assess Affect (typically observed):  Unable to Assess Orientation:  Oriented to Self, Oriented  to Place Alcohol / Substance use:    Psych involvement (Current and /or in the community):  No (Comment)  Discharge Needs  Concerns to be addressed:  Discharge Planning Concerns Readmission within the last 30 days:  Yes Current discharge risk:  None Barriers to Discharge:  No Barriers Identified   Justin Anna, LCSW 07/22/2016, 8:44 PM

## 2016-07-22 NOTE — Progress Notes (Signed)
Initial Nutrition Assessment  DOCUMENTATION CODES:   Morbid obesity  INTERVENTION:  - Will order Premier Protein once/day, this supplement provides 160 kcal and 30 grams of protein. - Continue to encourage PO intakes of meals and supplement. - RD will monitor for additional nutrition-related needs.  NUTRITION DIAGNOSIS:   Inadequate oral intake related to acute illness, nausea as evidenced by per patient/family report.   GOAL:   Patient will meet greater than or equal to 90% of their needs  MONITOR:   PO intake, Weight trends, Labs, Skin, I & O's  REASON FOR ASSESSMENT:   Consult Assessment of nutrition requirement/status  ASSESSMENT:   82 y.o. male with a past medical history significant for MO, OSA on CPAP, HTN, CKD III, IDDM, PTSD and chronic back pain on oral Dilaudid who presents with abdominal pain for 2 days and fever. The patient was recently admitted 10/7-10/11 for non-operative management of right ankle fracture, and discharged to SNF. In SNF, he has been working with PT and progressing well and was actually to go home today (11/22). However, over the last three days, the patient has developed a vague, diffuse, moderate intensity abdominal discomfort, and last night, this pain became severe.  It was constant "all over" and associated with nausea.   Pt seen for consult. BMI indicates morbid obesity. Pt was NPO 11/22 at 2003 until diet was advanced to Regular this AM at 0918. Pt consumed ~50% of breakfast which consisted of eggs, bacon, toast, and black coffee. Family was present with pt earlier this AM but left 45-60 minutes ago and pt resting at this time; he seemed to have some slight confusion while talking with family this AM. Will order Premier Protein to help pt in meeting estimated protein needs and will continue to monitor for additional nutrition-related needs.   Physical assessment shows no muscle or fat wasting at this time; mild edema present to extremities. Per  chart review, pt has lost 9 lbs (3% body weight) in the past 5 months which is not significant for time frame.   Medications reviewed; 100 mg Colace BID, sliding scale Novolog, 20 units Levemir at bedtime, PRN Zofran, 40 mg oral Protonix/day. Labs reviewed; CBGs: 104 and 108 mg/dL this AM, BUN: 29 mg/dL, creatinine: 1.48 mg/dL, Ca: 8.2 mg/dL, GFR: 42 mL/min.   IVF: NS @ 150 mL/hr.       Diet Order:  Diet regular Room service appropriate? Yes; Fluid consistency: Thin  Skin:  Wound (see comment) (Stage 1 sacral pressure injury)  Last BM:  11/21  Height:   Ht Readings from Last 1 Encounters:  07/20/16 5\' 11"  (1.803 m)    Weight:   Wt Readings from Last 1 Encounters:  07/20/16 287 lb 14.7 oz (130.6 kg)    Ideal Body Weight:  78.18 kg  BMI:  Body mass index is 40.16 kg/m.  Estimated Nutritional Needs:   Kcal:  1440-1700 (11-13 kcal/kg)  Protein:  90-100 grams (~1.2 grams/kg IBW)  Fluid:  1.6-1.8 L/day  EDUCATION NEEDS:   No education needs identified at this time    Jarome Matin, MS, RD, LDN, CNSC Inpatient Clinical Dietitian Pager # [phone] After hours/weekend pager # [phone]

## 2016-07-23 DIAGNOSIS — G9340 Encephalopathy, unspecified: Secondary | ICD-10-CM

## 2016-07-23 LAB — CBC
HEMATOCRIT: 38.9 % — AB (ref 39.0–52.0)
Hemoglobin: 12 g/dL — ABNORMAL LOW (ref 13.0–17.0)
MCH: 27.8 pg (ref 26.0–34.0)
MCHC: 30.8 g/dL (ref 30.0–36.0)
MCV: 90 fL (ref 78.0–100.0)
Platelets: 293 10*3/uL (ref 150–400)
RBC: 4.32 MIL/uL (ref 4.22–5.81)
RDW: 14.2 % (ref 11.5–15.5)
WBC: 16.4 10*3/uL — ABNORMAL HIGH (ref 4.0–10.5)

## 2016-07-23 LAB — GLUCOSE, CAPILLARY
GLUCOSE-CAPILLARY: 101 mg/dL — AB (ref 65–99)
GLUCOSE-CAPILLARY: 116 mg/dL — AB (ref 65–99)
GLUCOSE-CAPILLARY: 118 mg/dL — AB (ref 65–99)
GLUCOSE-CAPILLARY: 124 mg/dL — AB (ref 65–99)
GLUCOSE-CAPILLARY: 134 mg/dL — AB (ref 65–99)
Glucose-Capillary: 145 mg/dL — ABNORMAL HIGH (ref 65–99)
Glucose-Capillary: 148 mg/dL — ABNORMAL HIGH (ref 65–99)

## 2016-07-23 LAB — BASIC METABOLIC PANEL
ANION GAP: 8 (ref 5–15)
BUN: 29 mg/dL — ABNORMAL HIGH (ref 6–20)
CHLORIDE: 107 mmol/L (ref 101–111)
CO2: 26 mmol/L (ref 22–32)
Calcium: 8.5 mg/dL — ABNORMAL LOW (ref 8.9–10.3)
Creatinine, Ser: 1.27 mg/dL — ABNORMAL HIGH (ref 0.61–1.24)
GFR calc non Af Amer: 51 mL/min — ABNORMAL LOW (ref >60)
GFR, EST AFRICAN AMERICAN: 59 mL/min — AB (ref >60)
Glucose, Bld: 123 mg/dL — ABNORMAL HIGH (ref 65–99)
POTASSIUM: 3.7 mmol/L (ref 3.5–5.1)
SODIUM: 141 mmol/L (ref 135–145)

## 2016-07-23 LAB — HEPARIN LEVEL (UNFRACTIONATED)
HEPARIN UNFRACTIONATED: 0.42 [IU]/mL (ref 0.30–0.70)
Heparin Unfractionated: <0.1 IU/mL — ABNORMAL LOW (ref 0.30–0.70)
Heparin Unfractionated: 0.16 IU/mL — ABNORMAL LOW (ref 0.30–0.70)

## 2016-07-23 LAB — HEMOGLOBIN AND HEMATOCRIT, BLOOD
HCT: 37.6 % — ABNORMAL LOW (ref 39.0–52.0)
HCT: 37.3 % — ABNORMAL LOW (ref 39.0–52.0)
Hemoglobin: 11.8 g/dL — ABNORMAL LOW (ref 13.0–17.0)
Hemoglobin: 11.8 g/dL — ABNORMAL LOW (ref 13.0–17.0)

## 2016-07-23 MED ORDER — HEPARIN BOLUS VIA INFUSION
2000.0000 [IU] | Freq: Once | INTRAVENOUS | Status: AC
  Administered 2016-07-23: 2000 [IU] via INTRAVENOUS
  Filled 2016-07-23: qty 2000

## 2016-07-23 MED ORDER — FUROSEMIDE 10 MG/ML IJ SOLN
20.0000 mg | Freq: Once | INTRAMUSCULAR | Status: AC
  Administered 2016-07-23: 20 mg via INTRAVENOUS
  Filled 2016-07-23: qty 2

## 2016-07-23 NOTE — Progress Notes (Signed)
Subjective: [place] out of bed last night. Confused. States abd is still sore. Can't remember if had n/v  Objective: Vital signs in last 24 hours: Temp:  [97.5 F (36.4 C)-98.6 F (37 C)] 98.6 F (37 C) (11/25 0800) Resp:  [14-22] 17 (11/25 0600) BP: (115-154)/(54-75) 154/74 (11/25 0600) SpO2:  [65 %-100 %] 99 % (11/25 0600) Last BM Date: 07/19/16  Intake/Output from previous day: 11/24 0701 - 11/25 0700 In: 1552.2 [I.V.:1402.2; IV Piggyback:150] Out: 695 [Urine:600; Drains:95] Intake/Output this shift: No intake/output data recorded.  Awake, not oriented Obese, soft, some RUQ TTP; drain -bilious.   Lab Results:   Recent Labs  07/22/16 0305 07/23/16 0351 07/23/16 0848  WBC 20.9* 16.4*  --   HGB 11.7* 12.0* 11.8*  HCT 36.7* 38.9* 37.6*  PLT 256 293  --    BMET  Recent Labs  07/22/16 0305 07/23/16 0351  NA 140 141  K 4.1 3.7  CL 109 107  CO2 26 26  GLUCOSE 141* 123*  BUN 29* 29*  CREATININE 1.48* 1.27*  CALCIUM 8.2* 8.5*   PT/INR  Recent Labs  07/21/16 0100  LABPROT 17.4*  INR 1.42   ABG  Recent Labs  07/20/16 1148  HCO3 28.2*    Studies/Results: Ir Perc Cholecystostomy  Result Date: 07/21/2016 INDICATION: ACUTE CHOLECYSTITIS EXAM: CHOLECYSTOSTOMY MEDICATIONS: ZOSYN 3.375 G; The antibiotic was administered within an appropriate time frame prior to the initiation of the procedure. ANESTHESIA/SEDATION: Moderate (conscious) sedation was employed during this procedure. A total of Versed 2.0 mg and Fentanyl 50 mcg was administered intravenously. Moderate Sedation Time: 15 minutes. The patient's level of consciousness and vital signs were monitored continuously by radiology nursing throughout the procedure under my direct supervision. FLUOROSCOPY TIME:  Fluoroscopy Time:  36 seconds (36 mGy). COMPLICATIONS: None immediate. PROCEDURE: Informed written consent was obtained from the patient after a thorough discussion of the procedural risks, benefits and  alternatives. All questions were addressed. Maximal Sterile Barrier Technique was utilized including caps, mask, sterile gowns, sterile gloves, sterile drape, hand hygiene and skin antiseptic. A timeout was performed prior to the initiation of the procedure. Previous imaging reviewed. Preliminary ultrasound performed. Gallbladder localized in the right upper quadrant in the mid axillary line through a lower intercostal space. Under sterile conditions and local anesthesia, ultrasound percutaneous transhepatic needle access performed of the distended gallbladder. Needle position confirmed with ultrasound. There was return of bile. Guidewire inserted followed by Accustick dilator set. Amplatz guidewire advanced into the gallbladder. Tract dilatation performed to insert a 10 [place] drain. Retention loop formed in the gallbladder. Position confirmed with fluoroscopy. Images obtained for documentation. Catheter secured with a Prolene suture and sterile dressing. Catheter connected to external gravity drainage bag. IMPRESSION: Successful ultrasound and fluoroscopic 10 French percutaneous cholecystostomy as above. Electronically Signed   By: Jerilynn Mages.  Shick M.D.   On: 07/21/2016 11:33   Dg Chest Port 1 View  Result Date: 07/22/2016 CLINICAL DATA:  Cough and wheezing EXAM: PORTABLE CHEST 1 VIEW COMPARISON:  Chest radiograph and chest CT July 20, 2016 FINDINGS: There is new patchy airspace consolidation in the right lower lobe. Lungs elsewhere are clear. Heart is borderline enlarged with pulmonary vascularity within normal limits. No adenopathy. There is degenerative change in each shoulder. There is atherosclerotic calcification in the aorta. IMPRESSION: Patchy airspace disease consistent with a degree of pneumonia in the right upper lobe. Lungs elsewhere clear. Heart borderline enlarged. Aortic atherosclerosis. Electronically Signed   By: Lowella Grip III M.D.   On:  07/22/2016 10:49     Anti-infectives: Anti-infectives    Start     Dose/Rate Route Frequency Ordered Stop   07/21/16 0000  piperacillin-tazobactam (ZOSYN) IVPB 3.375 g     3.375 g 12.5 mL/hr over 240 Minutes Intravenous Every 8 hours 07/20/16 2021     07/20/16 1545  piperacillin-tazobactam (ZOSYN) IVPB 3.375 g     3.375 g 100 mL/hr over 30 Minutes Intravenous  Once 07/20/16 1535 07/20/16 1702   07/20/16 1311  vancomycin (VANCOCIN) 1-5 GM/200ML-% IVPB    Comments:  Woodward Ku   : cabinet override      07/20/16 1311 07/21/16 0114   07/20/16 1245  vancomycin (VANCOCIN) IVPB 1000 mg/200 mL premix     1,000 mg 200 mL/hr over 60 Minutes Intravenous Every 1 hr x 2 07/20/16 1234 07/20/16 1444   07/20/16 1232  ceFEPIme (MAXIPIME) 1 g injection    Comments:  Eulogio Ditch   : cabinet override      07/20/16 1232 07/21/16 0044   07/20/16 1230  vancomycin (VANCOCIN) 1,769 mg in sodium chloride 0.9 % 500 mL IVPB  Status:  Discontinued     15 mg/kg  117.9 kg 250 mL/hr over 120 Minutes Intravenous  Once 07/20/16 1228 07/20/16 1234   07/20/16 1230  ceFEPIme (MAXIPIME) 1 g in dextrose 5 % 50 mL IVPB     1 g 100 mL/hr over 30 Minutes Intravenous  Once 07/20/16 1228 07/20/16 1305      Assessment/Plan: s/p * No surgery found * Severe cholecystitis s/p perc cholecystostomy tube 11/23  Cont iv abx No fluid sent for cx so I ordered cx from bag fluid Cont drain Consider calorie counts Will see again on Monday Call with questions  Leighton Ruff. Redmond Pulling, MD, FACS General, Bariatric, & Minimally Invasive Surgery [facility], [place]   LOS: 3 days    Gayland Curry 07/23/2016

## 2016-07-23 NOTE — Progress Notes (Addendum)
Patient ID: [name], male   DOB: [birth year], 82 y.o.   MRN: [id number]  PROGRESS NOTE    [name]  IRC:[id number] DOB: 22-Sep-1931 DOA: 07/20/2016  PCP: Stephens Shire, MD   Brief Narrative:   82 year old male with past medical history significant for obstructive sleep apnea on Cipro, hypertension, chronic kidney disease stage III, insulin-dependent diabetes mellitus, posttraumatic stress disorder, chronic back pain on oral Dilaudid who presented to hospital with abdominal pain and fever. The patient was recently admitted 10/7-10/11 for non-operative mgmt of right ankle fracture and discharged to SNF. Over past couple of days his abdominal pain just progressively got worse and he had associated nausea and fever. In addition, patient appear more confused as per family member report.   In ED, patient was febrile with Tmax 101.11F, HR in 90's, RR 20, oxygen saturation in low 90s on 2 L nasal cannula oxygen support. Blood pressure was 106/56. Blood work showed white blood cell count of 30, lactate 3.36, mild elevation in troponin 0.26. Urinalysis was without pyuria or hematuria. Chest x-ray showed small lung volumes. CT head showed no acute intracranial findings. CT angiogram of the chest and abdomen showed left small pulmonary artery embolism as well as emphysematous cholecystitis versus fistula. Patient was given vancomycin, Zosyn on the admission for sepsis and IV fluids and started on heparin drip. He was seen by surgery in consultation who recommended percutaneous cholecystostomy tube placement by IR which was done 11/23.  Assessment & Plan:   Principal Problem:   Sepsis secondary to emphysematous cholecystitis / Leukocytosis / abdominal pain, nausea - Sepsis criteria met on the admission with fever, tachycardia, tachypnea, hypoxia, leukocytosis and lactic acidosis. Source of infection is emphysematous cholecystitis - Surgery recommended percutaneous cholecystostomy tube placement by  IR which was done 07/21/2016 - Continue zosyn for now - WBC count improving  - Lactic acid normalized - Continue to monitor in step down unit   Active Problems:   Acute metabolic encephalopathy - Secondary to sepsis - No significant changes in mental status as a matter of fact more agitated overnight so mittens placed    Acute respiratory failure with hypoxia / right upper lung lobe pneumonia, unspecified organism - Stable respiratory status. Chest x-ray with possible pneumonia in the right upper lung lobe - Patient already on Zosyn    Mild troponin elevation - Likely demand ischemia from sepsis and pneumonia - No apparent chest pain  - Only one troponin level 0.26 - Sinus rhythm on 12-lead EKG     Morbid obesity due to excess calories - Body mass index is 40.16 kg/m. - Nutrition consulted     Diabetes mellitus with diabetic nephropathy with long-term insulin use  - Continue Levemir 20 units at bedtime along with sliding scale insulin     OSA on CPAP - Stable respiratory status at this time - Chest x-ray yesterday showed patchy airspace disease consistent with a degree of pneumonia in right upper lobe but lungs otherwise clear     CKD stage 3 (HCC) - Cr 1.5 on admission, slightly above baseline value of 1.25 about one month ago - Elevated creatinine likely secondary to sepsis - Cr improving, 1.5 --> 1.4 --> 1.2    Acute pulmonary embolism (HCC) - Continue heparin drip    Right lleg cast - Apparently the plan was to take it off 12/4 [place] [facility] (on outpt basis)    DVT prophylaxis: Heparin drip  Code Status: full code  Family Communication: wfie at the  bedside this am Disposition Plan: remains in SDU   Consultants:   Surgery   Interventional radiology  Procedures:   Perc cholecystostomy 07/21/2016  LE doppler 11/23 - no DVT in right or left LE  Antimicrobials:   Zosyn 07/20/2016 -->  Vancomycin 1 dose given in ED    Subjective: Restless overnight  so mittens placed.  Objective: Vitals:   07/23/16 0400 07/23/16 0420 07/23/16 0600 07/23/16 0800  BP:  134/73 (!) 154/74 137/62  Pulse:      Resp:  '19 17 17  ' Temp: 98.4 F (36.9 C)   98.6 F (37 C)  TempSrc: Oral   Oral  SpO2:  95% 99% 97%  Weight:      Height:        Intake/Output Summary (Last 24 hours) at 07/23/16 1108 Last data filed at 07/23/16 1100  Gross per 24 hour  Intake          1065.42 ml  Output              695 ml  Net           370.42 ml   Filed Weights   07/20/16 1100 07/20/16 1840  Weight: 117.9 kg (260 lb) 130.6 kg (287 lb 14.7 oz)    Examination:  General exam: Now comfortable, has mittens on Respiratory system: Still wheezing but slightly better since yesterday, no rhonchi Cardiovascular system: S1 & S2 heard, Rate controlled  Gastrointestinal system: (+) BS, distended but not tender; drain in place, slight area of redness right below the drain but no visible rash Central nervous system: Sleeping this morning, arouses when called his name but otherwise falls back quickly asleep Extremities: Cast right leg, no edema LLE Skin: No ulcers or lesions Psychiatry: No agitation, calm  Data Reviewed: I have personally reviewed following labs and imaging studies  CBC:  Recent Labs Lab 07/20/16 1126 07/21/16 0100 07/22/16 0305 07/23/16 0351 07/23/16 0848  WBC 30.1* 28.5* 20.9* 16.4*  --   HGB 14.5 12.2* 11.7* 12.0* 11.8*  HCT 44.0 38.0* 36.7* 38.9* 37.6*  MCV 86.6 87.8 88.6 90.0  --   PLT 308 255 256 293  --    Basic Metabolic Panel:  Recent Labs Lab 07/20/16 1126 07/21/16 0100 07/22/16 0305 07/23/16 0351  NA 133* 135 140 141  K 4.1 4.0 4.1 3.7  CL 97* 102 109 107  CO2 '23 27 26 26  ' GLUCOSE 255* 186* 141* 123*  BUN 21* 27* 29* 29*  CREATININE 1.58* 1.56* 1.48* 1.27*  CALCIUM 9.1 8.2* 8.2* 8.5*   GFR: Estimated Creatinine Clearance: 61.8 mL/min (by C-G formula based on SCr of 1.27 mg/dL (H)). Liver Function Tests:  Recent Labs Lab  07/20/16 1126 07/21/16 0100  AST 24 17  ALT 15* 13*  ALKPHOS 94 71  BILITOT 0.7 1.0  PROT 7.4 6.0*  ALBUMIN 3.8 2.8*   No results for input(s): LIPASE, AMYLASE in the last 168 hours. No results for input(s): AMMONIA in the last 168 hours. Coagulation Profile:  Recent Labs Lab 07/21/16 0100  INR 1.42   Cardiac Enzymes:  Recent Labs Lab 07/20/16 1126 07/20/16 2039  TROPONINI 0.26* <0.03   BNP (last 3 results) No results for input(s): PROBNP in the last 8760 hours. HbA1C: No results for input(s): HGBA1C in the last 72 hours. CBG:  Recent Labs Lab 07/22/16 1553 07/22/16 1954 07/23/16 0027 07/23/16 0354 07/23/16 0753  GLUCAP 125* 112* 116* 118* 124*   Lipid Profile: No results for input(s):  CHOL, HDL, LDLCALC, TRIG, CHOLHDL, LDLDIRECT in the last 72 hours. Thyroid Function Tests: No results for input(s): TSH, T4TOTAL, FREET4, T3FREE, THYROIDAB in the last 72 hours. Anemia Panel: No results for input(s): VITAMINB12, FOLATE, FERRITIN, TIBC, IRON, RETICCTPCT in the last 72 hours. Urine analysis:    Component Value Date/Time   COLORURINE AMBER (A) 07/20/2016 1151   APPEARANCEUR CLEAR 07/20/2016 1151   LABSPEC 1.026 07/20/2016 1151   PHURINE 5.5 07/20/2016 1151   GLUCOSEU 250 (A) 07/20/2016 1151   HGBUR NEGATIVE 07/20/2016 1151   BILIRUBINUR SMALL (A) 07/20/2016 1151   KETONESUR 15 (A) 07/20/2016 1151   PROTEINUR 30 (A) 07/20/2016 1151   UROBILINOGEN 1.0 11/22/2012 1633   NITRITE NEGATIVE 07/20/2016 1151   LEUKOCYTESUR NEGATIVE 07/20/2016 1151   Sepsis Labs: '@[email or web address]' (procalcitonin:4,lacticidven:4)   MRSA PCR Screening     Status: None   Collection Time: 07/20/16  5:46 PM  Result Value Ref Range Status   MRSA by PCR NEGATIVE NEGATIVE Final      Radiology Studies: Dg Chest Port 1 View Result Date: 07/22/2016 Patchy airspace disease consistent with a degree of pneumonia in the right upper lobe. Lungs elsewhere clear. Heart borderline enlarged. Aortic  atherosclerosis. Electronically Signed   By: Lowella Grip III M.D.   On: 07/22/2016 10:49    Dg Chest 2 View Result Date: 07/20/2016 Low lung volumes with bibasilar atelectasis. Bibasilar pneumonia cannot be excluded. Small left pleural effusion.   Ct Head Wo Contrast Result Date: 07/20/2016 No acute abnormality.   Ct Angio Chest Pe W And/or Wo Contrast Result Date: 07/20/2016 Filling defect at the bifurcation of the LEFT lower lobe pulmonary artery consistent with a small age-indeterminate pulmonary embolus. Subsegmental atelectasis in BILATERAL lower lobes. Distended gallbladder with thickened wall, significant pericholecystic infiltrative changes, air within the gallbladder, and question minimal gas within the gallbladder wall. These findings could represent emphysematous cholecystitis or could be related to prior erosion of a gallstone from the gallbladder neck to the duodenum. Aortic atherosclerosis and mild coronary arterial calcification. BILATERAL inguinal hernias containing fat.   Ct Abdomen Pelvis W Contrast Result Date: 07/20/2016 Filling defect at the bifurcation of the LEFT lower lobe pulmonary artery consistent with a small age-indeterminate pulmonary embolus. Subsegmental atelectasis in BILATERAL lower lobes. Distended gallbladder with thickened wall, significant pericholecystic infiltrative changes, air within the gallbladder, and question minimal gas within the gallbladder wall. These findings could represent emphysematous cholecystitis or could be related to prior erosion of a gallstone from the gallbladder neck to the duodenum. Aortic atherosclerosis and mild coronary arterial calcification. BILATERAL inguinal hernias containing fat.     Scheduled Meds: . docusate sodium  100 mg Oral BID        . insulin aspart  0-15 Units Subcutaneous Q4H  . insulin detemir  20 Units Subcutaneous QHS  . methocarbamol  750 mg Oral TID  . pantoprazole  40 mg Oral Daily  .  piperacillin-tazobac  3.375 g Intravenous Q8H  . tamsulosin  0.4 mg Oral QPC supper   Continuous Infusions: . heparin 2,800 Units/hr (07/23/16 1100)     LOS: 3 days    Time spent: 25 minutes  Greater than 50% of the time spent on counseling and coordinating the care.   [name], MD [facility] Pager [phone]  If 7PM-7AM, please contact night-coverage [email or web address] Password [email or web address] 07/23/2016, 11:08 AM

## 2016-07-23 NOTE — Progress Notes (Signed)
ANTICOAGULATION CONSULT NOTE - Follow Up Consult  Pharmacy Consult for Heparin Indication: pulmonary embolus  No Known Allergies  Patient Measurements: Height: 5\' 11"  (180.3 cm) Weight: 287 lb 14.7 oz (130.6 kg) IBW/kg (Calculated) : 75.3 HEPARIN DW (KG): 105.1   Vital Signs: Temp: 98.6 F (37 C) (11/25 0800) Temp Source: Oral (11/25 0800) BP: 137/62 (11/25 0800)  Labs:  Recent Labs  07/20/16 1126 07/20/16 2039 07/21/16 0100  07/22/16 0305 07/22/16 1342 07/22/16 2303 07/23/16 0351 07/23/16 0848  HGB 14.5  --  12.2*  --  11.7*  --   --  12.0* 11.8*  HCT 44.0  --  38.0*  --  36.7*  --   --  38.9* 37.6*  PLT 308  --  255  --  256  --   --  293  --   APTT  --   --  138*  --   --   --   --   --   --   LABPROT  --   --  17.4*  --   --   --   --   --   --   INR  --   --  1.42  --   --   --   --   --   --   HEPARINUNFRC  --   --  0.40  < > 0.31 0.22* <0.10*  --  0.16*  CREATININE 1.58*  --  1.56*  --  1.48*  --   --  1.27*  --   TROPONINI 0.26* <0.03  --   --   --   --   --   --   --   < > = values in this interval not displayed.  Estimated Creatinine Clearance: 61.8 mL/min (by C-G formula based on SCr of 1.27 mg/dL (H)).   Medications:  Infusions:  . heparin 2,400 Units/hr (07/23/16 9892)    Assessment: 82 yo M at [place] for rehab due to be discharged today. PMH HTN, CKD stage 3, DM, hospital 6 weeks ago w/ ankle fx, sent to SNF with cast.  At [facility] with hypotension, temp 101, WBC 30, elevated troponin, lactic acid > 3. CT angio possible LLL PE.  Pharmacy consulted to dose heparin No anticoagulants PTA.  07/23/2016:  Heparin level subtherapeutic on 2400 units/hr  CBC- Hg low but stable, pltc wnl.  No bleeding reported by RN  Heparin off ~9p-MN yesterday due to lost IV access.    Goal of Therapy:  Heparin level 0.3-0.7 units/ml Monitor platelets by anticoagulation protocol: Yes   Plan:  Re-bolus heparin 2000 units IV x1 Increase heparin  drip to 2800 units/hr Recheck 8h heparin level Daily heparin level & CBC F/U long-term anticoagulation plans  Netta Cedars, PharmD, BCPS Pager: [phone] 07/23/2016,10:29 AM

## 2016-07-23 NOTE — Progress Notes (Signed)
ANTICOAGULATION CONSULT NOTE - Follow Up Consult  Pharmacy Consult for Heparin Indication: pulmonary embolus  No Known Allergies  Patient Measurements: Height: 5\' 11"  (180.3 cm) Weight: 287 lb 14.7 oz (130.6 kg) IBW/kg (Calculated) : 75.3 HEPARIN DW (KG): 105.1   Vital Signs: Temp: 100 F (37.8 C) (11/25 1600) Temp Source: Oral (11/25 1600) BP: 169/79 (11/25 1800)  Labs:  Recent Labs  07/20/16 2039  07/21/16 0100  07/22/16 0305  07/22/16 2303 07/23/16 0351 07/23/16 0848 07/23/16 1334 07/23/16 1855  HGB  --   < > 12.2*  --  11.7*  --   --  12.0* 11.8* 11.8*  --   HCT  --   < > 38.0*  --  36.7*  --   --  38.9* 37.6* 37.3*  --   PLT  --   --  255  --  256  --   --  293  --   --   --   APTT  --   --  138*  --   --   --   --   --   --   --   --   LABPROT  --   --  17.4*  --   --   --   --   --   --   --   --   INR  --   --  1.42  --   --   --   --   --   --   --   --   HEPARINUNFRC  --   --  0.40  < > 0.31  < > <0.10*  --  0.16*  --  0.42  CREATININE  --   --  1.56*  --  1.48*  --   --  1.27*  --   --   --   TROPONINI <0.03  --   --   --   --   --   --   --   --   --   --   < > = values in this interval not displayed.  Estimated Creatinine Clearance: 61.8 mL/min (by C-G formula based on SCr of 1.27 mg/dL (H)).   Medications:  Infusions:  . heparin 2,800 Units/hr (07/23/16 1800)    Assessment: 82 yo M at [place] for rehab due to be discharged today. PMH HTN, CKD stage 3, DM, hospital 6 weeks ago w/ ankle fx, sent to SNF with cast.  At [facility] with hypotension, temp 101, WBC 30, elevated troponin, lactic acid > 3. CT angio possible LLL PE.  Pharmacy consulted to dose heparin No anticoagulants PTA.  07/23/2016:  Heparin level at 1855 = 0.42, now therapeutic on heparin infusion at 2800 units/hr  CBC- Hgb low but stable, Pltc WNL.   No bleeding or line issues reported by RN    Goal of Therapy:  Heparin level 0.3-0.7 units/ml Monitor platelets by  anticoagulation protocol: Yes   Plan:  Continue heparin infusion at 2800 units/hr Recheck confirmatory heparin level in 8 hours Daily heparin level & CBC Monitor for s/sx of bleeding F/U long-term anticoagulation plans   Lindell Spar, PharmD, BCPS Pager: [phone] 07/23/2016 7:44 PM

## 2016-07-23 NOTE — Progress Notes (Signed)
Pt was found on the floor on the right side. The fall was heard from a nurse outside of the door. . Redness has been observed on the right flank with tenderness to the area, but no other injury has been observed. Pt is responsive and following commands. Neuro is consistent with prior this admission. Pt had removed himself from the monitor, but upon reconnecting pt is in NSR, BP 134/73, and O2 saturation of 98%. Pt was placed in the bed using the overhead lift. Dr has been notified with orders to repeat an H&H in 4 hours from previous lab draw, and to do neuro checks q30 minutes for 2 hours. The pt wife has been notified. Nurse will continue to monitor.

## 2016-07-23 NOTE — Progress Notes (Signed)
Pt continues to refuse CPAP.  RT to monitor and assess as needed.

## 2016-07-23 NOTE — Progress Notes (Signed)
ANTICOAGULATION CONSULT NOTE - Follow Up Consult  Pharmacy Consult for Heparin Indication: pulmonary embolus  No Known Allergies  Patient Measurements: Height: 5\' 11"  (180.3 cm) Weight: 287 lb 14.7 oz (130.6 kg) IBW/kg (Calculated) : 75.3 Heparin Dosing Weight:   Vital Signs: Temp: 97.5 F (36.4 C) (11/25 0000) Temp Source: Oral (11/25 0000) BP: 129/75 (11/25 0031)  Labs:  Recent Labs  07/20/16 1126 07/20/16 2039 07/21/16 0100  07/22/16 0305 07/22/16 1342 07/22/16 2303  HGB 14.5  --  12.2*  --  11.7*  --   --   HCT 44.0  --  38.0*  --  36.7*  --   --   PLT 308  --  255  --  256  --   --   APTT  --   --  138*  --   --   --   --   LABPROT  --   --  17.4*  --   --   --   --   INR  --   --  1.42  --   --   --   --   HEPARINUNFRC  --   --  0.40  < > 0.31 0.22* <0.10*  CREATININE 1.58*  --  1.56*  --  1.48*  --   --   TROPONINI 0.26* <0.03  --   --   --   --   --   < > = values in this interval not displayed.  Estimated Creatinine Clearance: 53 mL/min (by C-G formula based on SCr of 1.48 mg/dL (H)).   Medications:  Infusions:  . heparin 2,400 Units/hr (07/23/16 0021)    Assessment: Patient with low heparin level.  However, after discussion with RN, patients IV site was lost from about 2100 until 0030.  Will consider the low level as function of lost IV site vs rate.   Will retime next level.  Goal of Therapy:  Heparin level 0.3-0.7 units/ml Monitor platelets by anticoagulation protocol: Yes   Plan:  Continue heparin drip at current rate Recheck level at 0900  [name], [name] 07/23/2016,1:20 AM

## 2016-07-24 LAB — CBC
HCT: 36.5 % — ABNORMAL LOW (ref 39.0–52.0)
Hemoglobin: 11.6 g/dL — ABNORMAL LOW (ref 13.0–17.0)
MCH: 28 pg (ref 26.0–34.0)
MCHC: 31.8 g/dL (ref 30.0–36.0)
MCV: 88.2 fL (ref 78.0–100.0)
Platelets: 333 10*3/uL (ref 150–400)
RBC: 4.14 MIL/uL — ABNORMAL LOW (ref 4.22–5.81)
RDW: 14 % (ref 11.5–15.5)
WBC: 12.1 10*3/uL — AB (ref 4.0–10.5)

## 2016-07-24 LAB — HEPARIN LEVEL (UNFRACTIONATED): HEPARIN UNFRACTIONATED: 0.56 [IU]/mL (ref 0.30–0.70)

## 2016-07-24 LAB — GLUCOSE, CAPILLARY
GLUCOSE-CAPILLARY: 104 mg/dL — AB (ref 65–99)
GLUCOSE-CAPILLARY: 142 mg/dL — AB (ref 65–99)
GLUCOSE-CAPILLARY: 136 mg/dL — AB (ref 65–99)
Glucose-Capillary: 138 mg/dL — ABNORMAL HIGH (ref 65–99)
Glucose-Capillary: 101 mg/dL — ABNORMAL HIGH (ref 65–99)

## 2016-07-24 LAB — BASIC METABOLIC PANEL
ANION GAP: 5 (ref 5–15)
BUN: 25 mg/dL — ABNORMAL HIGH (ref 6–20)
CALCIUM: 8.3 mg/dL — AB (ref 8.9–10.3)
CO2: 29 mmol/L (ref 22–32)
Chloride: 108 mmol/L (ref 101–111)
Creatinine, Ser: 0.94 mg/dL (ref 0.61–1.24)
Glucose, Bld: 144 mg/dL — ABNORMAL HIGH (ref 65–99)
Potassium: 3.2 mmol/L — ABNORMAL LOW (ref 3.5–5.1)
Sodium: 142 mmol/L (ref 135–145)

## 2016-07-24 MED ORDER — HYDRALAZINE HCL 20 MG/ML IJ SOLN: 10.0000 mg | Freq: Four times a day (QID) | INTRAMUSCULAR | Status: DC | PRN

## 2016-07-24 MED ORDER — SIMVASTATIN 40 MG PO TABS
40.0000 mg | ORAL_TABLET | Freq: Every evening | ORAL | Status: DC
  Administered 2016-07-24 – 2016-07-26 (×3): 40 mg via ORAL
  Filled 2016-07-24 (×3): qty 1

## 2016-07-24 MED ORDER — VITAMIN D 1000 UNITS PO TABS
2000.0000 [IU] | ORAL_TABLET | Freq: Two times a day (BID) | ORAL | Status: DC
  Administered 2016-07-24 – 2016-07-27 (×6): 2000 [IU] via ORAL
  Filled 2016-07-24 (×6): qty 2

## 2016-07-24 MED ORDER — GALANTAMINE HYDROBROMIDE ER 8 MG PO CP24
16.0000 mg | ORAL_CAPSULE | Freq: Every day | ORAL | Status: DC
  Administered 2016-07-26 – 2016-07-27 (×2): 16 mg via ORAL
  Filled 2016-07-24: qty 2

## 2016-07-24 MED ORDER — POTASSIUM CHLORIDE CRYS ER 20 MEQ PO TBCR
40.0000 meq | EXTENDED_RELEASE_TABLET | Freq: Once | ORAL | Status: AC
  Administered 2016-07-24: 40 meq via ORAL
  Filled 2016-07-24: qty 2

## 2016-07-24 NOTE — Progress Notes (Signed)
Pt refuses CPAP, RT to monitor and assess as needed.  

## 2016-07-24 NOTE — Progress Notes (Signed)
Patient ID: [name], male   DOB: 11/17/1931, 82 y.o.   MRN: [id number]  PROGRESS NOTE    [name]  NUU:[id number] DOB: Mar 25, 1932 DOA: 07/20/2016  PCP: Stephens Shire, MD   Brief Narrative:   82 year old male with past medical history significant for obstructive sleep apnea on Cipro, hypertension, chronic kidney disease stage III, insulin-dependent diabetes mellitus, posttraumatic stress disorder, chronic back pain on oral Dilaudid who presented to hospital with abdominal pain and fever. The patient was recently admitted 10/7-10/11 for non-operative mgmt of right ankle fracture and discharged to SNF. Over past couple of days his abdominal pain just progressively got worse and he had associated nausea and fever. In addition, patient appear more confused as per family member report.   In ED, patient was febrile with Tmax 101.86F, HR in 90's, RR 20, oxygen saturation in low 90s on 2 L nasal cannula oxygen support. Blood pressure was 106/56. Blood work showed white blood cell count of 30, lactate 3.36, mild elevation in troponin 0.26. Urinalysis was without pyuria or hematuria. Chest x-ray showed small lung volumes. CT head showed no acute intracranial findings. CT angiogram of the chest and abdomen showed left small pulmonary artery embolism as well as emphysematous cholecystitis versus fistula. Patient was given vancomycin, Zosyn on the admission for sepsis and IV fluids and started on heparin drip. He was seen by surgery in consultation who recommended percutaneous cholecystostomy tube placement by IR which was done 11/23.  Assessment & Plan:   Principal Problem:   Sepsis secondary to emphysematous cholecystitis / Leukocytosis / abdominal pain, nausea - Sepsis criteria were met on the admission with fever, tachycardia, tachypnea, hypoxia, leukocytosis and lactic acidosis. Source of infection is emphysematous cholecystitis - Percutaneous cholecystostomy tube placed by IR11/23/2017 -  Continue zosyn  - WBC count improving  - Lactic acid normalized - Transfer to telemetry floor today, pt medical stable at this time  Active Problems:   Acute metabolic encephalopathy - Secondary to sepsis - No significant changes in mental status in past 24 hours     Acute respiratory failure with hypoxia / right upper lung lobe pneumonia, unspecified organism - Stable respiratory status. Chest x-ray with possible pneumonia in the right upper lung lobe - Continue zosyn     Mild troponin elevation - Likely demand ischemia from sepsis and pneumonia - No apparent chest pain  - Troponin level 0.26 - Sinus rhythm on 12-lead EKG     Morbid obesity due to excess calories - Body mass index is 40.16 kg/m. - Nutrition consulted     Diabetes mellitus with diabetic nephropathy with long-term insulin use  - Continue Levemir 20 units at bedtime along with sliding scale insulin  - CBG's in past 24 hours: 148, 138, 101    OSA on CPAP - Stable respiratory status at this time - Chest x-ray 11/24 showed patchy airspace disease consistent with a degree of pneumonia in right upper lobe but lungs otherwise clear    CKD stage 3 (HCC) - Cr 1.5 on admission, slightly above baseline value of 1.25 about one month ago - Elevated creatinine likely secondary to sepsis - Cr improving, 1.5 --> 1.4 --> 1.2 - WNL    Acute pulmonary embolism (HCC) - Continue heparin drip    Right lleg cast - Apparently the plan was to take it off 12/4 [place] [facility] (on outpt basis)    DVT prophylaxis: Heparin drip  Code Status: full code  Family Communication: wfie at the bedside this  am Disposition Plan: transfer to telemetry floor today    Consultants:   Surgery   Interventional radiology  Procedures:   Perc cholecystostomy 07/21/2016  LE doppler 11/23 - no DVT in right or left LE  Antimicrobials:   Zosyn 07/20/2016 -->  Vancomycin 1 dose given in ED    Subjective: No overnight events.    Objective: Vitals:   07/24/16 0400 07/24/16 0500 07/24/16 0600 07/24/16 0800  BP: (!) 160/71  124/68 (!) 144/61  Pulse:      Resp: (!) 22 14 (!) 22 (!) 22  Temp: 99 F (37.2 C)   98.7 F (37.1 C)  TempSrc: Axillary   Oral  SpO2: 97% 97% 91% 96%  Weight:      Height:        Intake/Output Summary (Last 24 hours) at 07/24/16 1121 Last data filed at 07/24/16 0400  Gross per 24 hour  Intake              783 ml  Output              420 ml  Net              363 ml   Filed Weights   07/20/16 1100 07/20/16 1840  Weight: 117.9 kg (260 lb) 130.6 kg (287 lb 14.7 oz)    Examination:  General exam: No distress, calm  Respiratory system: wheezing but no rhonchi  Cardiovascular system: S1 & S2 heard, Rate controlled  Gastrointestinal system: (+) BS, distended but not tender; drain in place Central nervous system: nonfocal  Extremities: Cast right leg, no edema LLE Skin: warm, dry  Psychiatry: normal behavior   Data Reviewed: I have personally reviewed following labs and imaging studies  CBC:  Recent Labs Lab 07/20/16 1126 07/21/16 0100 07/22/16 0305 07/23/16 0351 07/23/16 0848 07/23/16 1334 07/24/16 0348  WBC 30.1* 28.5* 20.9* 16.4*  --   --  12.1*  HGB 14.5 12.2* 11.7* 12.0* 11.8* 11.8* 11.6*  HCT 44.0 38.0* 36.7* 38.9* 37.6* 37.3* 36.5*  MCV 86.6 87.8 88.6 90.0  --   --  88.2  PLT 308 255 256 293  --   --  194   Basic Metabolic Panel:  Recent Labs Lab 07/20/16 1126 07/21/16 0100 07/22/16 0305 07/23/16 0351 07/24/16 0348  NA 133* 135 140 141 142  K 4.1 4.0 4.1 3.7 3.2*  CL 97* 102 109 107 108  CO2 _0 GLUCOSE 255* 186* 141* 123* 144*  BUN 21* 27* 29* 29* 25*  CREATININE 1.58* 1.56* 1.48* 1.27* 0.94  CALCIUM 9.1 8.2* 8.2* 8.5* 8.3*   GFR: Estimated Creatinine Clearance: 83.5 mL/min (by C-G formula based on SCr of 0.94 mg/dL). Liver Function Tests:  Recent Labs Lab 07/20/16 1126 07/21/16 0100  AST 24 17  ALT 15* 13*  ALKPHOS 94 71   BILITOT 0.7 1.0  PROT 7.4 6.0*  ALBUMIN 3.8 2.8*   No results for input(s): LIPASE, AMYLASE in the last 168 hours. No results for input(s): AMMONIA in the last 168 hours. Coagulation Profile:  Recent Labs Lab 07/21/16 0100  INR 1.42   Cardiac Enzymes:  Recent Labs Lab 07/20/16 1126 07/20/16 2039  TROPONINI 0.26* <0.03   BNP (last 3 results) No results for input(s): PROBNP in the last 8760 hours. HbA1C: No results for input(s): HGBA1C in the last 72 hours. CBG:  Recent Labs Lab 07/23/16 1505 07/23/16 2002 07/23/16 2317 07/24/16 0355 07/24/16 0802  GLUCAP 134* 145* 148*  138* 101*   Lipid Profile: No results for input(s): CHOL, HDL, LDLCALC, TRIG, CHOLHDL, LDLDIRECT in the last 72 hours. Thyroid Function Tests: No results for input(s): TSH, T4TOTAL, FREET4, T3FREE, THYROIDAB in the last 72 hours. Anemia Panel: No results for input(s): VITAMINB12, FOLATE, FERRITIN, TIBC, IRON, RETICCTPCT in the last 72 hours. Urine analysis:    Component Value Date/Time   COLORURINE AMBER (A) 07/20/2016 1151   APPEARANCEUR CLEAR 07/20/2016 1151   LABSPEC 1.026 07/20/2016 1151   PHURINE 5.5 07/20/2016 1151   GLUCOSEU 250 (A) 07/20/2016 1151   HGBUR NEGATIVE 07/20/2016 1151   BILIRUBINUR SMALL (A) 07/20/2016 1151   KETONESUR 15 (A) 07/20/2016 1151   PROTEINUR 30 (A) 07/20/2016 1151   UROBILINOGEN 1.0 11/22/2012 1633   NITRITE NEGATIVE 07/20/2016 1151   LEUKOCYTESUR NEGATIVE 07/20/2016 1151   Sepsis Labs: _0 (procalcitonin:4,lacticidven:4)   MRSA PCR Screening     Status: None   Collection Time: 07/20/16  5:46 PM  Result Value Ref Range Status   MRSA by PCR NEGATIVE NEGATIVE Final      Radiology Studies: Dg Chest Port 1 View Result Date: 07/22/2016 Patchy airspace disease consistent with a degree of pneumonia in the right upper lobe. Lungs elsewhere clear. Heart borderline enlarged. Aortic atherosclerosis. Electronically Signed   By: Lowella Grip III M.D.    On: 07/22/2016 10:49    Dg Chest 2 View Result Date: 07/20/2016 Low lung volumes with bibasilar atelectasis. Bibasilar pneumonia cannot be excluded. Small left pleural effusion.   Ct Head Wo Contrast Result Date: 07/20/2016 No acute abnormality.   Ct Angio Chest Pe W And/or Wo Contrast Result Date: 07/20/2016 Filling defect at the bifurcation of the LEFT lower lobe pulmonary artery consistent with a small age-indeterminate pulmonary embolus. Subsegmental atelectasis in BILATERAL lower lobes. Distended gallbladder with thickened wall, significant pericholecystic infiltrative changes, air within the gallbladder, and question minimal gas within the gallbladder wall. These findings could represent emphysematous cholecystitis or could be related to prior erosion of a gallstone from the gallbladder neck to the duodenum. Aortic atherosclerosis and mild coronary arterial calcification. BILATERAL inguinal hernias containing fat.   Ct Abdomen Pelvis W Contrast Result Date: 07/20/2016 Filling defect at the bifurcation of the LEFT lower lobe pulmonary artery consistent with a small age-indeterminate pulmonary embolus. Subsegmental atelectasis in BILATERAL lower lobes. Distended gallbladder with thickened wall, significant pericholecystic infiltrative changes, air within the gallbladder, and question minimal gas within the gallbladder wall. These findings could represent emphysematous cholecystitis or could be related to prior erosion of a gallstone from the gallbladder neck to the duodenum. Aortic atherosclerosis and mild coronary arterial calcification. BILATERAL inguinal hernias containing fat.     Scheduled Meds: . docusate sodium  100 mg Oral BID        . insulin aspart  0-15 Units Subcutaneous Q4H  . insulin detemir  20 Units Subcutaneous QHS  . methocarbamol  750 mg Oral TID  . pantoprazole  40 mg Oral Daily  . piperacillin-tazobac  3.375 g Intravenous Q8H  . tamsulosin  0.4 mg Oral QPC  supper   Continuous Infusions: . heparin 2,800 Units/hr (07/24/16 0627)     LOS: 4 days    Time spent: 25 minutes  Greater than 50% of the time spent on counseling and coordinating the care.   [name], MD [facility] Pager [phone]  If 7PM-7AM, please contact night-coverage [email or web address] Password [email or web address] 07/24/2016, 11:21 AM

## 2016-07-24 NOTE — Progress Notes (Signed)
Agree with ICU RNs Shift Assessment. TeleSys in place. Bed alarm on. Call bell in reach.

## 2016-07-24 NOTE — Progress Notes (Signed)
Subjective: Ate some but remains intermittently confused.   Objective: Vital signs in last 24 hours: Temp:  [98.4 F (36.9 C)-100 F (37.8 C)] 98.7 F (37.1 C) (11/26 0800) Resp:  [14-30] 22 (11/26 0800) BP: (124-169)/(60-122) 144/61 (11/26 0800) SpO2:  [91 %-99 %] 96 % (11/26 0800) Last BM Date: 07/19/16  Intake/Output from previous day: 11/25 0701 - 11/26 0700 In: 880.1 [P.O.:200; I.V.:573.1; IV Piggyback:100] Out: 720 [Urine:700; Drains:20] Intake/Output this shift: No intake/output data recorded.  Not oriented, not approp Obese, soft, mild RUQ ttp; drain -bilious, 20cc/24hrs  Lab Results:   Recent Labs  07/23/16 0351  07/23/16 1334 07/24/16 0348  WBC 16.4*  --   --  12.1*  HGB 12.0*  < > 11.8* 11.6*  HCT 38.9*  < > 37.3* 36.5*  PLT 293  --   --  333  < > = values in this interval not displayed. BMET  Recent Labs  07/23/16 0351 07/24/16 0348  NA 141 142  K 3.7 3.2*  CL 107 108  CO2 26 29  GLUCOSE 123* 144*  BUN 29* 25*  CREATININE 1.27* 0.94  CALCIUM 8.5* 8.3*   PT/INR No results for input(s): LABPROT, INR in the last 72 hours. ABG No results for input(s): PHART, HCO3 in the last 72 hours.  Invalid input(s): PCO2, PO2  Studies/Results: Dg Chest Port 1 View  Result Date: 07/22/2016 CLINICAL DATA:  Cough and wheezing EXAM: PORTABLE CHEST 1 VIEW COMPARISON:  Chest radiograph and chest CT July 20, 2016 FINDINGS: There is new patchy airspace consolidation in the right lower lobe. Lungs elsewhere are clear. Heart is borderline enlarged with pulmonary vascularity within normal limits. No adenopathy. There is degenerative change in each shoulder. There is atherosclerotic calcification in the aorta. IMPRESSION: Patchy airspace disease consistent with a degree of pneumonia in the right upper lobe. Lungs elsewhere clear. Heart borderline enlarged. Aortic atherosclerosis. Electronically Signed   By: Lowella Grip III M.D.   On: 07/22/2016 10:49     Anti-infectives: Anti-infectives    Start     Dose/Rate Route Frequency Ordered Stop   07/21/16 0000  piperacillin-tazobactam (ZOSYN) IVPB 3.375 g     3.375 g 12.5 mL/hr over 240 Minutes Intravenous Every 8 hours 07/20/16 2021     07/20/16 1545  piperacillin-tazobactam (ZOSYN) IVPB 3.375 g     3.375 g 100 mL/hr over 30 Minutes Intravenous  Once 07/20/16 1535 07/20/16 1702   07/20/16 1311  vancomycin (VANCOCIN) 1-5 GM/200ML-% IVPB    Comments:  Woodward Ku   : cabinet override      07/20/16 1311 07/21/16 0114   07/20/16 1245  vancomycin (VANCOCIN) IVPB 1000 mg/200 mL premix     1,000 mg 200 mL/hr over 60 Minutes Intravenous Every 1 hr x 2 07/20/16 1234 07/20/16 1444   07/20/16 1232  ceFEPIme (MAXIPIME) 1 g injection    Comments:  Eulogio Ditch   : cabinet override      07/20/16 1232 07/21/16 0044   07/20/16 1230  vancomycin (VANCOCIN) 1,769 mg in sodium chloride 0.9 % 500 mL IVPB  Status:  Discontinued     15 mg/kg  117.9 kg 250 mL/hr over 120 Minutes Intravenous  Once 07/20/16 1228 07/20/16 1234   07/20/16 1230  ceFEPIme (MAXIPIME) 1 g in dextrose 5 % 50 mL IVPB     1 g 100 mL/hr over 30 Minutes Intravenous  Once 07/20/16 1228 07/20/16 1305      Assessment/Plan: Severe cholecystitis s/p cholecystostomy tube 11/23 Sepsis -  improved ARF with RUL PNA Morbid obesity DM2 OSA Acute on CKD - cr improving Encephalopathy  Acute PE  Infection improving - decreasing wbc, normal lactate, cont IV abx, f/u cx Encourage PO Discussed with son/wife  [name]. Justin Pulling, MD, FACS General, Bariatric, & Minimally Invasive Surgery [facility], [place]    LOS: 4 days    Gayland Curry 07/24/2016

## 2016-07-24 NOTE — Progress Notes (Signed)
ANTICOAGULATION CONSULT NOTE - Follow Up Consult  Pharmacy Consult for Heparin Indication: pulmonary embolus  No Known Allergies  Patient Measurements: Height: 5\' 11"  (180.3 cm) Weight: 287 lb 14.7 oz (130.6 kg) IBW/kg (Calculated) : 75.3 Heparin Dosing Weight:   Vital Signs: Temp: 99 F (37.2 C) (11/26 0400) Temp Source: Axillary (11/26 0400) BP: 160/71 (11/26 0400)  Labs:  Recent Labs  07/22/16 0305  07/23/16 0351 07/23/16 0848 07/23/16 1334 07/23/16 1855 07/24/16 0348  HGB 11.7*  --  12.0* 11.8* 11.8*  --  11.6*  HCT 36.7*  --  38.9* 37.6* 37.3*  --  36.5*  PLT 256  --  293  --   --   --  333  HEPARINUNFRC 0.31  < >  --  0.16*  --  0.42 0.56  CREATININE 1.48*  --  1.27*  --   --   --  0.94  < > = values in this interval not displayed.  Estimated Creatinine Clearance: 83.5 mL/min (by C-G formula based on SCr of 0.94 mg/dL).   Medications:  Infusions:  . heparin 2,800 Units/hr (07/24/16 0400)    Assessment: Patient with heparin level at goal.  No heparin issues noted.  Goal of Therapy:  Heparin level 0.3-0.7 units/ml Monitor platelets by anticoagulation protocol: Yes   Plan:  Continue heparin drip at current rate Recheck level with 11/27 AM labs  [name], [name] 07/24/2016,4:43 AM

## 2016-07-24 NOTE — Progress Notes (Signed)
Continues to decline nocturnal CPAP. No equipment at bedside. RT will continue to follow.

## 2016-07-25 ENCOUNTER — Inpatient Hospital Stay (HOSPITAL_COMMUNITY): Payer: Medicare Other

## 2016-07-25 LAB — CBC
HCT: 38.3 % — ABNORMAL LOW (ref 39.0–52.0)
HEMOGLOBIN: 12 g/dL — AB (ref 13.0–17.0)
MCH: 27.6 pg (ref 26.0–34.0)
MCHC: 31.3 g/dL (ref 30.0–36.0)
MCV: 88.2 fL (ref 78.0–100.0)
PLATELETS: 366 10*3/uL (ref 150–400)
RBC: 4.34 MIL/uL (ref 4.22–5.81)
RDW: 14.2 % (ref 11.5–15.5)
WBC: 10.3 10*3/uL (ref 4.0–10.5)

## 2016-07-25 LAB — CULTURE, BLOOD (ROUTINE X 2)
CULTURE: NO GROWTH
Culture: NO GROWTH

## 2016-07-25 LAB — HEPARIN LEVEL (UNFRACTIONATED): Heparin Unfractionated: 0.81 IU/mL — ABNORMAL HIGH (ref 0.30–0.70)

## 2016-07-25 LAB — GLUCOSE, CAPILLARY
GLUCOSE-CAPILLARY: 116 mg/dL — AB (ref 65–99)
GLUCOSE-CAPILLARY: 117 mg/dL — AB (ref 65–99)
GLUCOSE-CAPILLARY: 124 mg/dL — AB (ref 65–99)
GLUCOSE-CAPILLARY: 124 mg/dL — AB (ref 65–99)
GLUCOSE-CAPILLARY: 129 mg/dL — AB (ref 65–99)
Glucose-Capillary: 119 mg/dL — ABNORMAL HIGH (ref 65–99)

## 2016-07-25 LAB — BASIC METABOLIC PANEL
ANION GAP: 7 (ref 5–15)
BUN: 21 mg/dL — ABNORMAL HIGH (ref 6–20)
CO2: 26 mmol/L (ref 22–32)
Calcium: 8.4 mg/dL — ABNORMAL LOW (ref 8.9–10.3)
Chloride: 109 mmol/L (ref 101–111)
Creatinine, Ser: 0.93 mg/dL (ref 0.61–1.24)
GFR calc Af Amer: >60 mL/min (ref >60)
Glucose, Bld: 129 mg/dL — ABNORMAL HIGH (ref 65–99)
POTASSIUM: 3.5 mmol/L (ref 3.5–5.1)
SODIUM: 142 mmol/L (ref 135–145)

## 2016-07-25 MED ORDER — HEPARIN (PORCINE) IN NACL 100-0.45 UNIT/ML-% IJ SOLN
2400.0000 [IU]/h | INTRAMUSCULAR | Status: AC
  Administered 2016-07-25: 2400 [IU]/h via INTRAVENOUS
  Filled 2016-07-25: qty 250

## 2016-07-25 MED ORDER — SIMETHICONE 80 MG PO CHEW
80.0000 mg | CHEWABLE_TABLET | Freq: Three times a day (TID) | ORAL | Status: DC | PRN
  Administered 2016-07-25 – 2016-07-26 (×2): 80 mg via ORAL
  Filled 2016-07-25 (×2): qty 1

## 2016-07-25 MED ORDER — RIVAROXABAN 20 MG PO TABS: 20.0000 mg | ORAL_TABLET | Freq: Every day | ORAL | Status: DC

## 2016-07-25 MED ORDER — RIVAROXABAN 15 MG PO TABS
15.0000 mg | ORAL_TABLET | Freq: Two times a day (BID) | ORAL | Status: DC
  Administered 2016-07-25 – 2016-07-27 (×4): 15 mg via ORAL
  Filled 2016-07-25 (×4): qty 1

## 2016-07-25 MED ORDER — RIVAROXABAN (XARELTO) EDUCATION KIT FOR DVT/PE PATIENTS
PACK | Freq: Once | Status: DC
  Filled 2016-07-25: qty 1

## 2016-07-25 NOTE — Progress Notes (Addendum)
Patient ID: [name], male   DOB: 03-Feb-1932, 82 y.o.   MRN: [id number]  PROGRESS NOTE    VALDEZ BRANNAN  GHW:[id number] DOB: [birth year] DOA: 07/20/2016  PCP: Stephens Shire, MD   Brief Narrative:   82 year old male with past medical history significant for obstructive sleep apnea on Cipro, hypertension, chronic kidney disease stage III, insulin-dependent diabetes mellitus, posttraumatic stress disorder, chronic back pain on oral Dilaudid who presented to hospital with abdominal pain and fever. The patient was recently admitted 10/7-10/11 for non-operative mgmt of right ankle fracture and discharged to SNF. Over past couple of days his abdominal pain just progressively got worse and he had associated nausea and fever. In addition, patient appear more confused as per family member report.  In ED, patient was febrile with Tmax 101.41F, HR in 90's, RR 20, oxygen saturation in low 90s on 2 L nasal cannula oxygen support. Blood pressure was 106/56. Blood work showed white blood cell count of 30, lactate 3.36, mild elevation in troponin 0.26. Urinalysis was without pyuria or hematuria. Chest x-ray showed small lung volumes. CT head showed no acute intracranial findings. CT angiogram of the chest and abdomen showed left small pulmonary artery embolism as well as emphysematous cholecystitis versus fistula. Patient was given vancomycin, Zosyn on the admission for sepsis and IV fluids and started on heparin drip. He was seen by surgery in consultation who recommended percutaneous cholecystostomy tube placement by IR which was done 11/23.  Assessment & Plan:   Principal Problem:   Sepsis secondary to emphysematous cholecystitis / Leukocytosis / abdominal pain, nausea - Sepsis criteria were met on the admission with fever, tachycardia, tachypnea, hypoxia, leukocytosis and lactic acidosis. Source of infection is emphysematous cholecystitis - Percutaneous cholecystostomy tube placed by IR11/23/2017 -  Appreciate IR following, drains in place, draining well - Continue zosyn  - WBC count improving  - Lactic acid normalized  Active Problems:   Acute metabolic encephalopathy - Secondary to sepsis - Much better this morning, alert and oriented to time and place and person    Acute respiratory failure with hypoxia / right upper lung lobe pneumonia, unspecified organism - Stable respiratory status. Chest x-ray with possible pneumonia in the right upper lung lobe - Continue zosyn     Mild troponin elevation - Likely demand ischemia from sepsis and pneumonia - No complaints of chest pain - Troponin level 0.26 - Sinus rhythm on 12-lead EKG     Morbid obesity due to excess calories - Body mass index is 40.16 kg/m. - Nutrition consulted     Diabetes mellitus with diabetic nephropathy with long-term insulin use  - Continue Levemir 20 units at bedtime along with sliding scale insulin     OSA on CPAP - Stable respiratory status at this time - Chest x-ray 11/24 showed patchy airspace disease consistent with a degree of pneumonia in right upper lobe but lungs otherwise clear    CKD stage 3 (HCC) / Hypokalemia - Cr 1.5 on admission, slightly above baseline value of 1.25 about one month ago - Elevated creatinine likely secondary to sepsis - Creatinine subsequently normalized - Hypokalemia supplemented and normalized    Acute pulmonary embolism ([place]) - On heparin drip but we will switch to Xarelto     Right leg cast for ankle fracture  - Plan was to take it off 12/4 [place] [facility] (on outpt basis) - Will get x ray today to monitor for healing     DVT prophylaxis: Heparin drip --> xarelto  Code Status: full code  Family Communication: wfie at the bedside this am Disposition Plan: Home in next 48 hours   Consultants:   Surgery   Interventional radiology  Procedures:   Perc cholecystostomy 07/21/2016  LE doppler 11/23 - no DVT in right or left LE  Antimicrobials:   Zosyn  07/20/2016 -->  Vancomycin 1 dose given in ED    Subjective: No overnight events.   Objective: Vitals:   07/24/16 1840 07/24/16 1854 07/24/16 2122 07/25/16 0528  BP: (!) 162/89  (!) 150/96 (!) 163/94  Pulse: 85  76 88  Resp: (!) _0 Temp: 98.9 F (37.2 C)  98.4 F (36.9 C) 98.7 F (37.1 C)  TempSrc: Oral  Oral Oral  SpO2: 98% 94% 98% 98%  Weight:      Height:        Intake/Output Summary (Last 24 hours) at 07/25/16 1210 Last data filed at 07/25/16 8466  Gross per 24 hour  Intake          1165.07 ml  Output              302 ml  Net           863.07 ml   Filed Weights   07/20/16 1100 07/20/16 1840  Weight: 117.9 kg (260 lb) 130.6 kg (287 lb 14.7 oz)    Examination:  General exam: No distress, calm and comfortable Respiratory system: No wheezing, no rhonchi Cardiovascular system: S1 & S2 heard, Rate controlled  Gastrointestinal system: (+) BS, distended but not tender; drain in place Central nervous system: No focal deficits l  Extremities: Cast right leg, no edema LLE Skin: No lesions or ulcers Psychiatry: Normal mood and behavior  Data Reviewed: I have personally reviewed following labs and imaging studies  CBC:  Recent Labs Lab 07/21/16 0100 07/22/16 0305 07/23/16 0351 07/23/16 0848 07/23/16 1334 07/24/16 0348 07/25/16 0449  WBC 28.5* 20.9* 16.4*  --   --  12.1* 10.3  HGB 12.2* 11.7* 12.0* 11.8* 11.8* 11.6* 12.0*  HCT 38.0* 36.7* 38.9* 37.6* 37.3* 36.5* 38.3*  MCV 87.8 88.6 90.0  --   --  88.2 88.2  PLT 255 256 293  --   --  333 599   Basic Metabolic Panel:  Recent Labs Lab 07/21/16 0100 07/22/16 0305 07/23/16 0351 07/24/16 0348 07/25/16 0449  NA 135 140 141 142 142  K 4.0 4.1 3.7 3.2* 3.5  CL 102 109 107 108 109  CO2 _1 GLUCOSE 186* 141* 123* 144* 129*  BUN 27* 29* 29* 25* 21*  CREATININE 1.56* 1.48* 1.27* 0.94 0.93  CALCIUM 8.2* 8.2* 8.5* 8.3* 8.4*   GFR: Estimated Creatinine Clearance: 84.4 mL/min (by C-G  formula based on SCr of 0.93 mg/dL). Liver Function Tests:  Recent Labs Lab 07/20/16 1126 07/21/16 0100  AST 24 17  ALT 15* 13*  ALKPHOS 94 71  BILITOT 0.7 1.0  PROT 7.4 6.0*  ALBUMIN 3.8 2.8*   No results for input(s): LIPASE, AMYLASE in the last 168 hours. No results for input(s): AMMONIA in the last 168 hours. Coagulation Profile:  Recent Labs Lab 07/21/16 0100  INR 1.42   Cardiac Enzymes:  Recent Labs Lab 07/20/16 1126 07/20/16 2039  TROPONINI 0.26* <0.03   BNP (last 3 results) No results for input(s): PROBNP in the last 8760 hours. HbA1C: No results for input(s): HGBA1C in the last 72 hours. CBG:  Recent Labs Lab 07/24/16 2103 07/25/16 0010  07/25/16 0425 07/25/16 0751 07/25/16 1150  GLUCAP 136* 116* 119* 129* 124*   Lipid Profile: No results for input(s): CHOL, HDL, LDLCALC, TRIG, CHOLHDL, LDLDIRECT in the last 72 hours. Thyroid Function Tests: No results for input(s): TSH, T4TOTAL, FREET4, T3FREE, THYROIDAB in the last 72 hours. Anemia Panel: No results for input(s): VITAMINB12, FOLATE, FERRITIN, TIBC, IRON, RETICCTPCT in the last 72 hours. Urine analysis:    Component Value Date/Time   COLORURINE AMBER (A) 07/20/2016 1151   APPEARANCEUR CLEAR 07/20/2016 1151   LABSPEC 1.026 07/20/2016 1151   PHURINE 5.5 07/20/2016 1151   GLUCOSEU 250 (A) 07/20/2016 1151   HGBUR NEGATIVE 07/20/2016 1151   BILIRUBINUR SMALL (A) 07/20/2016 1151   KETONESUR 15 (A) 07/20/2016 1151   PROTEINUR 30 (A) 07/20/2016 1151   UROBILINOGEN 1.0 11/22/2012 1633   NITRITE NEGATIVE 07/20/2016 1151   LEUKOCYTESUR NEGATIVE 07/20/2016 1151   Sepsis Labs: _0 (procalcitonin:4,lacticidven:4)   MRSA PCR Screening     Status: None   Collection Time: 07/20/16  5:46 PM  Result Value Ref Range Status   MRSA by PCR NEGATIVE NEGATIVE Final      Radiology Studies: Dg Chest Port 1 View Result Date: 07/22/2016 Patchy airspace disease consistent with a degree of pneumonia  in the right upper lobe. Lungs elsewhere clear. Heart borderline enlarged. Aortic atherosclerosis. Electronically Signed   By: Lowella Grip III M.D.   On: 07/22/2016 10:49    Dg Chest 2 View Result Date: 07/20/2016 Low lung volumes with bibasilar atelectasis. Bibasilar pneumonia cannot be excluded. Small left pleural effusion.   Ct Head Wo Contrast Result Date: 07/20/2016 No acute abnormality.   Ct Angio Chest Pe W And/or Wo Contrast Result Date: 07/20/2016 Filling defect at the bifurcation of the LEFT lower lobe pulmonary artery consistent with a small age-indeterminate pulmonary embolus. Subsegmental atelectasis in BILATERAL lower lobes. Distended gallbladder with thickened wall, significant pericholecystic infiltrative changes, air within the gallbladder, and question minimal gas within the gallbladder wall. These findings could represent emphysematous cholecystitis or could be related to prior erosion of a gallstone from the gallbladder neck to the duodenum. Aortic atherosclerosis and mild coronary arterial calcification. BILATERAL inguinal hernias containing fat.   Ct Abdomen Pelvis W Contrast Result Date: 07/20/2016 Filling defect at the bifurcation of the LEFT lower lobe pulmonary artery consistent with a small age-indeterminate pulmonary embolus. Subsegmental atelectasis in BILATERAL lower lobes. Distended gallbladder with thickened wall, significant pericholecystic infiltrative changes, air within the gallbladder, and question minimal gas within the gallbladder wall. These findings could represent emphysematous cholecystitis or could be related to prior erosion of a gallstone from the gallbladder neck to the duodenum. Aortic atherosclerosis and mild coronary arterial calcification. BILATERAL inguinal hernias containing fat.     Scheduled Meds: . docusate sodium  100 mg Oral BID        . insulin aspart  0-15 Units Subcutaneous Q4H  . insulin detemir  20 Units Subcutaneous QHS    . methocarbamol  750 mg Oral TID  . pantoprazole  40 mg Oral Daily  . piperacillin-tazobac  3.375 g Intravenous Q8H  . tamsulosin  0.4 mg Oral QPC supper   Continuous Infusions: . heparin 2,400 Units/hr (07/25/16 0907)     LOS: 5 days    Time spent: 15 minutes  Greater than 50% of the time spent on counseling and coordinating the care.   [name], MD [facility] Pager [phone]  If 7PM-7AM, please contact night-coverage [email or web address] Password [email or web address] 07/25/2016, 12:10 PM

## 2016-07-25 NOTE — Progress Notes (Addendum)
ANTICOAGULATION CONSULT NOTE - Follow Up Consult  Pharmacy Consult for Heparin Indication: pulmonary embolus  No Known Allergies  Patient Measurements: Height: 5\' 11"  (180.3 cm) Weight: 287 lb 14.7 oz (130.6 kg) IBW/kg (Calculated) : 75.3 HEPARIN DW (KG): 105.1   Vital Signs: Temp: 98.7 F (37.1 C) (11/27 0528) Temp Source: Oral (11/27 0528) BP: 163/94 (11/27 0528) Pulse Rate: 88 (11/27 0528)  Labs:  Recent Labs  07/23/16 0351  07/23/16 1334 07/23/16 1855 07/24/16 0348 07/25/16 0449  HGB 12.0*  < > 11.8*  --  11.6* 12.0*  HCT 38.9*  < > 37.3*  --  36.5* 38.3*  PLT 293  --   --   --  333 366  HEPARINUNFRC  --   < >  --  0.42 0.56 0.81*  CREATININE 1.27*  --   --   --  0.94 0.93  < > = values in this interval not displayed.  Estimated Creatinine Clearance: 84.4 mL/min (by C-G formula based on SCr of 0.93 mg/dL).   Medications:  Infusions:  . heparin      Assessment: 82 yo M at [place] for rehab due to be discharged today. PMH HTN, CKD stage 3, DM, hospital 6 weeks ago w/ ankle fx, sent to SNF with cast.  At [facility] with hypotension, temp 101, WBC 30, elevated troponin, lactic acid > 3. CT angio possible LLL PE.  Pharmacy consulted to dose heparin No anticoagulants PTA.  07/25/2016:  Heparin level now supratherapeutic on 2800 units/hr, had previously been in goal but with rising levels  CBC: Hgb low but stable, Pltc WNL.   No bleeding or line issues reported by RN    Goal of Therapy:  Heparin level 0.3-0.7 units/ml Monitor platelets by anticoagulation protocol: Yes   Plan:   Reduce heparin infusion to 2400 units/hr; per nomogram would reduce to 2100 units, but patient was previously not therapeutic on this rate.  Recheck confirmatory heparin level in 8 hours  Daily heparin level & CBC  Monitor for s/sx of bleeding  If no further plans for surgery, recommend converting to Lovenox or an oral agent as achieving therapeutic levels on  UFH has proved difficult.   Reuel Boom, PharmD, BCPS Pager: [phone] 07/25/2016, 7:56 AM    ADDENDUM  MD agrees with converting to [place] information pending  CrCl > 30 ml/min, stable   Start Xarelto 15 mg PO bid x 21 days, followed by 20 mg daily thereafter. Patient has been on heparin IV < 7 days and has had numerous low levels or stoppages of therapy, so would not shorten initial high-dosing phase  Stop heparin at same time first dose of Xarelto given  Reuel Boom, PharmD, BCPS Pager: [phone] 07/25/2016, 1:09 PM

## 2016-07-25 NOTE — Progress Notes (Signed)
Patient ID: [name], male   DOB: 10-03-1931, 82 y.o.   MRN: [id number]    Referring Physician(s): Dr. Greer Pickerel  Supervising Physician: Aletta Edouard  Patient Status:  [facility] [place] - In-pt  Chief Complaint: cholecystitis  Subjective: S/p perc cholecystostomy performed by Dr. Annamaria Boots on 07/21/2016. Patient was awake, alert, and oriented. Family claims he has vastly improved mentally since yesterday and "is back to his old self". Complains of tenderness around the drainage site and diffuse abdominal tenderness but not worse.   Allergies: Patient has no known allergies.  Medications: Prior to Admission medications   Medication Sig Start Date End Date Taking? Authorizing Provider  carvedilol (COREG) 3.125 MG tablet Take 3.125 mg by mouth 2 (two) times daily with a meal.   Yes Historical Provider, MD  cyanocobalamin 500 MCG tablet Take 500 mcg by mouth 2 (two) times daily.   Yes Historical Provider, MD  docusate sodium (COLACE) 100 MG capsule Take 1 capsule (100 mg total) by mouth 2 (two) times daily. While taking narcotic pain medicine. Patient taking differently: Take 100 mg by mouth 2 (two) times daily.  02/20/15  Yes Corky Sing, PA-C  Ergocalciferol (VITAMIN D2) 2000 UNITS TABS Take 2,000 Units by mouth 2 (two) times daily.    Yes Historical Provider, MD  galantamine (RAZADYNE ER) 16 MG 24 hr capsule Take 16 mg by mouth daily with breakfast.   Yes Historical Provider, MD  HYDROmorphone (DILAUDID) 2 MG tablet Take 1 tablet (2 mg total) by mouth every 3 (three) hours as needed for severe pain. 06/08/16  Yes Velvet Bathe, MD  Hypromellose (GENTEAL MILD TO MODERATE) 0.3 % SOLN Apply 1 drop to eye 2 (two) times daily.   Yes Historical Provider, MD  insulin aspart (NOVOLOG) 100 UNIT/ML injection Inject 0-15 Units into the skin 3 (three) times daily with meals. Per sensitive sliding scale Patient taking differently: Inject 0-15 Units into the skin 3 (three) times daily with meals as needed  for high blood sugar. Less than 60=Call MD 101-150=3 units 151-200=4 201-250=9 301-350=12 Over 350= 15 Over 400=Call MD 11/21/12  Yes Domenic Polite, MD  insulin detemir (LEVEMIR) 100 UNIT/ML injection Inject 42 Units into the skin at bedtime.   Yes Historical Provider, MD  isosorbide mononitrate (IMDUR) 30 MG 24 hr tablet Take 30 mg by mouth every morning.    Yes Historical Provider, MD  magnesium oxide (MAG-OX) 400 MG tablet Take 400 mg by mouth 2 (two) times daily.   Yes Historical Provider, MD  methocarbamol (ROBAXIN) 750 MG tablet Take 1 tablet by mouth every 6 (six) hours as needed for muscle spasms.  11/21/15  Yes Historical Provider, MD  Multiple Vitamins-Minerals (PRESERVISION AREDS 2 PO) Take 1 capsule by mouth 2 (two) times daily.   Yes Historical Provider, MD  pantoprazole (PROTONIX) 40 MG tablet Take 1 tablet (40 mg total) by mouth daily. Patient taking differently: Take 40 mg by mouth 2 (two) times daily.  12/20/12  Yes [name], DO  polyethylene glycol (MIRALAX / GLYCOLAX) packet Take 17 g by mouth daily.   Yes Historical Provider, MD  QUEtiapine (SEROQUEL) 50 MG tablet Take 50 mg by mouth at bedtime.   Yes Historical Provider, MD  simvastatin (ZOCOR) 40 MG tablet Take 40 mg by mouth every evening.   Yes Historical Provider, MD  tamsulosin (FLOMAX) 0.4 MG CAPS capsule Take 1 capsule (0.4 mg total) by mouth daily after supper. 02/15/16  Yes Barton Dubois, MD     Vital  Signs: BP (!) 163/94 (BP Location: Left Wrist)   Pulse 88   Temp 98.7 F (37.1 C) (Oral)   Resp 18   Ht 5\' 11"  (1.803 m)   Wt 287 lb 14.7 oz (130.6 kg)   SpO2 98%   BMI 40.16 kg/m   Physical Exam  Awake, alert, oriented. Abdomen is soft, non-distended, and diffusely tender. Drain in place with bilious output. Drain flushed without difficulty. Drain site with mild tenderness but appears clean and dry, no erythema, purulent drainage, swelling, or fluctuance. Bowels sounds normoactive. Regular heart rate and  rhythm. Casting still present on right lower extremity but no edema noted on the left lower extremity.   Imaging: Ir Perc Cholecystostomy  Result Date: 07/21/2016 INDICATION: ACUTE CHOLECYSTITIS EXAM: CHOLECYSTOSTOMY MEDICATIONS: ZOSYN 3.375 G; The antibiotic was administered within an appropriate time frame prior to the initiation of the procedure. ANESTHESIA/SEDATION: Moderate (conscious) sedation was employed during this procedure. A total of Versed 2.0 mg and Fentanyl 50 mcg was administered intravenously. Moderate Sedation Time: 15 minutes. The patient's level of consciousness and vital signs were monitored continuously by radiology nursing throughout the procedure under my direct supervision. FLUOROSCOPY TIME:  Fluoroscopy Time:  36 seconds (36 mGy). COMPLICATIONS: None immediate. PROCEDURE: Informed written consent was obtained from the patient after a thorough discussion of the procedural risks, benefits and alternatives. All questions were addressed. Maximal Sterile Barrier Technique was utilized including caps, mask, sterile gowns, sterile gloves, sterile drape, hand hygiene and skin antiseptic. A timeout was performed prior to the initiation of the procedure. Previous imaging reviewed. Preliminary ultrasound performed. Gallbladder localized in the right upper quadrant in the mid axillary line through a lower intercostal space. Under sterile conditions and local anesthesia, ultrasound percutaneous transhepatic needle access performed of the distended gallbladder. Needle position confirmed with ultrasound. There was return of bile. Guidewire inserted followed by Accustick dilator set. Amplatz guidewire advanced into the gallbladder. Tract dilatation performed to insert a 10 [place] drain. Retention loop formed in the gallbladder. Position confirmed with fluoroscopy. Images obtained for documentation. Catheter secured with a Prolene suture and sterile dressing. Catheter connected to external gravity  drainage bag. IMPRESSION: Successful ultrasound and fluoroscopic 10 French percutaneous cholecystostomy as above. Electronically Signed   By: Jerilynn Mages.  Shick M.D.   On: 07/21/2016 11:33   Dg Chest Port 1 View  Result Date: 07/22/2016 CLINICAL DATA:  Cough and wheezing EXAM: PORTABLE CHEST 1 VIEW COMPARISON:  Chest radiograph and chest CT July 20, 2016 FINDINGS: There is new patchy airspace consolidation in the right lower lobe. Lungs elsewhere are clear. Heart is borderline enlarged with pulmonary vascularity within normal limits. No adenopathy. There is degenerative change in each shoulder. There is atherosclerotic calcification in the aorta. IMPRESSION: Patchy airspace disease consistent with a degree of pneumonia in the right upper lobe. Lungs elsewhere clear. Heart borderline enlarged. Aortic atherosclerosis. Electronically Signed   By: Lowella Grip III M.D.   On: 07/22/2016 10:49    Labs:  CBC:  Recent Labs  07/22/16 0305 07/23/16 0351 07/23/16 0848 07/23/16 1334 07/24/16 0348 07/25/16 0449  WBC 20.9* 16.4*  --   --  12.1* 10.3  HGB 11.7* 12.0* 11.8* 11.8* 11.6* 12.0*  HCT 36.7* 38.9* 37.6* 37.3* 36.5* 38.3*  PLT 256 293  --   --  333 366    COAGS:  Recent Labs  07/21/16 0100  INR 1.42  APTT 138*    BMP:  Recent Labs  07/22/16 0305 07/23/16 0351 07/24/16 0348 07/25/16 0449  NA 140 141 142 142  K 4.1 3.7 3.2* 3.5  CL 109 107 108 109  CO2 26 26 29 26   GLUCOSE 141* 123* 144* 129*  BUN 29* 29* 25* 21*  CALCIUM 8.2* 8.5* 8.3* 8.4*  CREATININE 1.48* 1.27* 0.94 0.93  GFRNONAA 42* 51* >60 >60  GFRAA 49* 59* >60 >60    LIVER FUNCTION TESTS:  Recent Labs  06/04/16 1706 06/05/16 0448 07/20/16 1126 07/21/16 0100  BILITOT 0.5 0.5 0.7 1.0  AST 20 18 24 17   ALT 15* 14* 15* 13*  ALKPHOS 80 84 94 71  PROT 6.9 6.8 7.4 6.0*  ALBUMIN 3.6 3.7 3.8 2.8*    Assessment and Plan: Acute cholecystitis, s/p perc drain on 11/23 by Dr. Annamaria Boots -cont with perc chole  drain.   -pain improved, but site and abdomen are still mildly tender.  -no obvious infection at site; WBC down to 10.3(12.1) -drain has good output of bilious fluid; cx's pend-drain will stay in for 6-8 weeks, then plans for surgical intervention will be determined at that time per general surgery.   Electronically Signed: D. Rowe Robert 07/25/2016, 10:17 AM   I spent a total of 15 Minutes at the the patient's bedside AND on the patient's hospital floor or unit, greater than 50% of which was counseling/coordinating care for percutaneous cholecystostomy

## 2016-07-25 NOTE — Progress Notes (Signed)
Subjective: Up to bedside with diet, activity limited due to right ankle cast  Objective: Vital signs in last 24 hours: Temp:  [98.4 F (36.9 C)-99.1 F (37.3 C)] 98.7 F (37.1 C) (11/27 0528) Pulse Rate:  [76-88] 88 (11/27 0528) Resp:  [18-28] 18 (11/27 0528) BP: (150-164)/(28-96) 163/94 (11/27 0528) SpO2:  [93 %-98 %] 98 % (11/27 0528) Last BM Date: 07/24/16 480 PO 200 urine recorded BM x 2 Drain 102 recorded Afebrile, VSS WBC improving No films  Intake/Output from previous day: 11/26 0701 - 11/27 0700 In: 1065.1 [P.O.:480; I.V.:535.1; IV Piggyback:50] Out: 302 [Urine:200; Drains:102] Intake/Output this shift: Total I/O In: 240 [P.O.:240] Out: -   General appearance: alert, cooperative, no distress and confusion is better this AM GI: soft, non-tender; bowel sounds normal; no masses,  no organomegaly and drain working well, drain site is fine.  Lab Results:   Recent Labs  07/24/16 0348 07/25/16 0449  WBC 12.1* 10.3  HGB 11.6* 12.0*  HCT 36.5* 38.3*  PLT 333 366    BMET  Recent Labs  07/24/16 0348 07/25/16 0449  NA 142 142  K 3.2* 3.5  CL 108 109  CO2 29 26  GLUCOSE 144* 129*  BUN 25* 21*  CREATININE 0.94 0.93  CALCIUM 8.3* 8.4*   PT/INR No results for input(s): LABPROT, INR in the last 72 hours.   Recent Labs Lab 07/20/16 1126 07/21/16 0100  AST 24 17  ALT 15* 13*  ALKPHOS 94 71  BILITOT 0.7 1.0  PROT 7.4 6.0*  ALBUMIN 3.8 2.8*     Lipase  No results found for: LIPASE   Studies/Results: No results found. Prior to Admission medications   Medication Sig Start Date End Date Taking? Authorizing Provider  carvedilol (COREG) 3.125 MG tablet Take 3.125 mg by mouth 2 (two) times daily with a meal.   Yes Historical Provider, MD  cyanocobalamin 500 MCG tablet Take 500 mcg by mouth 2 (two) times daily.   Yes Historical Provider, MD  docusate sodium (COLACE) 100 MG capsule Take 1 capsule (100 mg total) by mouth 2 (two) times daily. While  taking narcotic pain medicine. Patient taking differently: Take 100 mg by mouth 2 (two) times daily.  02/20/15  Yes Corky Sing, PA-C  Ergocalciferol (VITAMIN D2) 2000 UNITS TABS Take 2,000 Units by mouth 2 (two) times daily.    Yes Historical Provider, MD  galantamine (RAZADYNE ER) 16 MG 24 hr capsule Take 16 mg by mouth daily with breakfast.   Yes Historical Provider, MD  HYDROmorphone (DILAUDID) 2 MG tablet Take 1 tablet (2 mg total) by mouth every 3 (three) hours as needed for severe pain. 06/08/16  Yes Velvet Bathe, MD  Hypromellose (GENTEAL MILD TO MODERATE) 0.3 % SOLN Apply 1 drop to eye 2 (two) times daily.   Yes Historical Provider, MD  insulin aspart (NOVOLOG) 100 UNIT/ML injection Inject 0-15 Units into the skin 3 (three) times daily with meals. Per sensitive sliding scale Patient taking differently: Inject 0-15 Units into the skin 3 (three) times daily with meals as needed for high blood sugar. Less than 60=Call MD 101-150=3 units 151-200=4 201-250=9 301-350=12 Over 350= 15 Over 400=Call MD 11/21/12  Yes Domenic Polite, MD  insulin detemir (LEVEMIR) 100 UNIT/ML injection Inject 42 Units into the skin at bedtime.   Yes Historical Provider, MD  isosorbide mononitrate (IMDUR) 30 MG 24 hr tablet Take 30 mg by mouth every morning.    Yes Historical Provider, MD  magnesium oxide (MAG-OX) 400  MG tablet Take 400 mg by mouth 2 (two) times daily.   Yes Historical Provider, MD  methocarbamol (ROBAXIN) 750 MG tablet Take 1 tablet by mouth every 6 (six) hours as needed for muscle spasms.  11/21/15  Yes Historical Provider, MD  Multiple Vitamins-Minerals (PRESERVISION AREDS 2 PO) Take 1 capsule by mouth 2 (two) times daily.   Yes Historical Provider, MD  pantoprazole (PROTONIX) 40 MG tablet Take 1 tablet (40 mg total) by mouth daily. Patient taking differently: Take 40 mg by mouth 2 (two) times daily.  12/20/12  Yes [name], DO  polyethylene glycol (MIRALAX / GLYCOLAX) packet Take 17 g by  mouth daily.   Yes Historical Provider, MD  QUEtiapine (SEROQUEL) 50 MG tablet Take 50 mg by mouth at bedtime.   Yes Historical Provider, MD  simvastatin (ZOCOR) 40 MG tablet Take 40 mg by mouth every evening.   Yes Historical Provider, MD  tamsulosin (FLOMAX) 0.4 MG CAPS capsule Take 1 capsule (0.4 mg total) by mouth daily after supper. 02/15/16  Yes Barton Dubois, MD    Medications: . cholecalciferol  2,000 Units Oral BID  . docusate sodium  100 mg Oral BID  . galantamine  16 mg Oral Q breakfast  . Influenza vac split quadrivalent PF  0.5 mL Intramuscular Tomorrow-1000  . insulin aspart  0-15 Units Subcutaneous Q4H  . insulin detemir  20 Units Subcutaneous QHS  . mouth rinse  15 mL Mouth Rinse BID  . methocarbamol  750 mg Oral TID  . pantoprazole  40 mg Oral Daily  . piperacillin-tazobactam (ZOSYN)  IV  3.375 g Intravenous Q8H  . protein supplement shake  11 oz Oral Daily  . simvastatin  40 mg Oral QPM  . sodium chloride flush  3 mL Intravenous Q12H  . tamsulosin  0.4 mg Oral QPC supper   . heparin 2,400 Units/hr (07/25/16 0093)    Assessment/Plan Severe cholecystitis s/p cholecystostomy tube 07/21/16 by IR (AT) Sepsis - improved ARF with RUL PNA Morbid obesity (Body mass index is 40) DM2 OSA Chronic back pain - dilaudid at home Right ankle fx with cast 06/08/16 PTSD/[place] Acute on CKD - cr improving Encephalopathy  Acute PE - heparin drip FEN: Cardiac diet GH:WEXHB 07/20/16 =>> day 6  Plan:  Doing well with drain working tolerating diet.  Info for follow up with our office and cholecystostomy drain care are in the AVS.  Radiology should set up for follow up with the drain clinic.      LOS: 5 days    Brentton Wardlow 07/25/2016 [phone]

## 2016-07-25 NOTE — Care Management Important Message (Signed)
Important Message  Patient Details  Name: ORVILLE MENA MRN: [id number] Date of Birth: November 25, 1931   Medicare Important Message Given:  Yes    Camillo Flaming 07/25/2016, 10:15 AMImportant Message  Patient Details  Name: EBON KETCHUM MRN: [id number] Date of Birth: 31-Oct-1931   Medicare Important Message Given:  Yes    Camillo Flaming 07/25/2016, 10:15 AM

## 2016-07-26 ENCOUNTER — Inpatient Hospital Stay (HOSPITAL_COMMUNITY): Payer: Medicare Other

## 2016-07-26 LAB — CBC
HEMATOCRIT: 38.6 % — AB (ref 39.0–52.0)
Hemoglobin: 12 g/dL — ABNORMAL LOW (ref 13.0–17.0)
MCH: 27.7 pg (ref 26.0–34.0)
MCHC: 31.1 g/dL (ref 30.0–36.0)
MCV: 89.1 fL (ref 78.0–100.0)
Platelets: 392 10*3/uL (ref 150–400)
RBC: 4.33 MIL/uL (ref 4.22–5.81)
RDW: 14.2 % (ref 11.5–15.5)
WBC: 10.9 10*3/uL — ABNORMAL HIGH (ref 4.0–10.5)

## 2016-07-26 LAB — GLUCOSE, CAPILLARY
GLUCOSE-CAPILLARY: 175 mg/dL — AB (ref 65–99)
Glucose-Capillary: 110 mg/dL — ABNORMAL HIGH (ref 65–99)
Glucose-Capillary: 138 mg/dL — ABNORMAL HIGH (ref 65–99)
Glucose-Capillary: 157 mg/dL — ABNORMAL HIGH (ref 65–99)
Glucose-Capillary: 96 mg/dL (ref 65–99)
Glucose-Capillary: 99 mg/dL (ref 65–99)

## 2016-07-26 MED ORDER — VITAMINS A & D EX OINT
TOPICAL_OINTMENT | CUTANEOUS | Status: AC
  Administered 2016-07-26: 05:00:00
  Filled 2016-07-26: qty 5

## 2016-07-26 MED ORDER — POLYETHYLENE GLYCOL 3350 17 G PO PACK
17.0000 g | PACK | Freq: Every day | ORAL | Status: DC
  Administered 2016-07-26: 17 g via ORAL
  Filled 2016-07-26 (×3): qty 1

## 2016-07-26 MED ORDER — ISOSORBIDE MONONITRATE ER 30 MG PO TB24
30.0000 mg | ORAL_TABLET | Freq: Every day | ORAL | Status: DC
  Administered 2016-07-26 – 2016-07-27 (×2): 30 mg via ORAL
  Filled 2016-07-26 (×2): qty 1

## 2016-07-26 MED ORDER — CARVEDILOL 3.125 MG PO TABS
3.1250 mg | ORAL_TABLET | Freq: Two times a day (BID) | ORAL | Status: DC
  Administered 2016-07-26 – 2016-07-27 (×2): 3.125 mg via ORAL
  Filled 2016-07-26 (×2): qty 1

## 2016-07-26 MED ORDER — INSULIN ASPART 100 UNIT/ML ~~LOC~~ SOLN
0.0000 [IU] | Freq: Three times a day (TID) | SUBCUTANEOUS | Status: DC
  Administered 2016-07-27 (×2): 2 [IU] via SUBCUTANEOUS

## 2016-07-26 MED ORDER — ZOLPIDEM TARTRATE 5 MG PO TABS
5.0000 mg | ORAL_TABLET | Freq: Once | ORAL | Status: AC
  Administered 2016-07-26: 5 mg via ORAL
  Filled 2016-07-26: qty 1

## 2016-07-26 NOTE — Progress Notes (Signed)
Patient ID: [name], male   DOB: 1931/10/15, 82 y.o.   MRN: [id number]    Referring Physician(s): Dr. Greer Pickerel  Supervising Physician: Arne Cleveland  Patient Status: [facility] - In-pt  Chief Complaint: Acute cholecystitis  Subjective: Patient doing better.  Not having much abdominal pain.  Mentation is much better  Allergies: Patient has no known allergies.  Medications: Prior to Admission medications   Medication Sig Start Date End Date Taking? Authorizing Provider  carvedilol (COREG) 3.125 MG tablet Take 3.125 mg by mouth 2 (two) times daily with a meal.   Yes Historical Provider, MD  cyanocobalamin 500 MCG tablet Take 500 mcg by mouth 2 (two) times daily.   Yes Historical Provider, MD  docusate sodium (COLACE) 100 MG capsule Take 1 capsule (100 mg total) by mouth 2 (two) times daily. While taking narcotic pain medicine. Patient taking differently: Take 100 mg by mouth 2 (two) times daily.  02/20/15  Yes Corky Sing, PA-C  Ergocalciferol (VITAMIN D2) 2000 UNITS TABS Take 2,000 Units by mouth 2 (two) times daily.    Yes Historical Provider, MD  galantamine (RAZADYNE ER) 16 MG 24 hr capsule Take 16 mg by mouth daily with breakfast.   Yes Historical Provider, MD  HYDROmorphone (DILAUDID) 2 MG tablet Take 1 tablet (2 mg total) by mouth every 3 (three) hours as needed for severe pain. 06/08/16  Yes Velvet Bathe, MD  Hypromellose (GENTEAL MILD TO MODERATE) 0.3 % SOLN Apply 1 drop to eye 2 (two) times daily.   Yes Historical Provider, MD  insulin aspart (NOVOLOG) 100 UNIT/ML injection Inject 0-15 Units into the skin 3 (three) times daily with meals. Per sensitive sliding scale Patient taking differently: Inject 0-15 Units into the skin 3 (three) times daily with meals as needed for high blood sugar. Less than 60=Call MD 101-150=3 units 151-200=4 201-250=9 301-350=12 Over 350= 15 Over 400=Call MD 11/21/12  Yes Domenic Polite, MD  insulin detemir (LEVEMIR) 100 UNIT/ML  injection Inject 42 Units into the skin at bedtime.   Yes Historical Provider, MD  isosorbide mononitrate (IMDUR) 30 MG 24 hr tablet Take 30 mg by mouth every morning.    Yes Historical Provider, MD  magnesium oxide (MAG-OX) 400 MG tablet Take 400 mg by mouth 2 (two) times daily.   Yes Historical Provider, MD  methocarbamol (ROBAXIN) 750 MG tablet Take 1 tablet by mouth every 6 (six) hours as needed for muscle spasms.  11/21/15  Yes Historical Provider, MD  Multiple Vitamins-Minerals (PRESERVISION AREDS 2 PO) Take 1 capsule by mouth 2 (two) times daily.   Yes Historical Provider, MD  pantoprazole (PROTONIX) 40 MG tablet Take 1 tablet (40 mg total) by mouth daily. Patient taking differently: Take 40 mg by mouth 2 (two) times daily.  12/20/12  Yes [name], DO  polyethylene glycol (MIRALAX / GLYCOLAX) packet Take 17 g by mouth daily.   Yes Historical Provider, MD  QUEtiapine (SEROQUEL) 50 MG tablet Take 50 mg by mouth at bedtime.   Yes Historical Provider, MD  simvastatin (ZOCOR) 40 MG tablet Take 40 mg by mouth every evening.   Yes Historical Provider, MD  tamsulosin (FLOMAX) 0.4 MG CAPS capsule Take 1 capsule (0.4 mg total) by mouth daily after supper. 02/15/16  Yes Barton Dubois, MD    Vital Signs: BP (!) 147/80 (BP Location: Left Wrist)   Pulse 82   Temp 98.7 F (37.1 C) (Oral)   Resp 18   Ht 5\' 11"  (1.803 m)  Wt 287 lb 14.7 oz (130.6 kg)   SpO2 95%   BMI 40.16 kg/m   Physical Exam: Abd: soft, minimal RUQ tenderness, perc chole drain in place with bilious output.  Drain site is c/d/i  Imaging: Dg Ankle Right Port  Result Date: 07/25/2016 CLINICAL DATA:  Ankle fracture 1 month ago.  Follow-up. EXAM: PORTABLE RIGHT ANKLE - 2 VIEW COMPARISON:  06/04/2016.  CT 06/06/2016. FINDINGS: Two screws are noted in the posterior calcaneus. Plate and screw fixation device in the distal fibula. No hardware complicating feature. Slightly displaced medial malleolar fracture again noted with  fracture line remaining evident. IMPRESSION: Internal fixation of the distal fibular fracture and calcaneal fracture posteriorly. Slightly displaced medial malleolar fracture again noted with fracture line remaining evident. Electronically Signed   By: Rolm Baptise M.D.   On: 07/25/2016 12:54    Labs:  CBC:  Recent Labs  07/23/16 0351  07/23/16 1334 07/24/16 0348 07/25/16 0449 07/26/16 0503  WBC 16.4*  --   --  12.1* 10.3 10.9*  HGB 12.0*  < > 11.8* 11.6* 12.0* 12.0*  HCT 38.9*  < > 37.3* 36.5* 38.3* 38.6*  PLT 293  --   --  333 366 392  < > = values in this interval not displayed.  COAGS:  Recent Labs  07/21/16 0100  INR 1.42  APTT 138*    BMP:  Recent Labs  07/22/16 0305 07/23/16 0351 07/24/16 0348 07/25/16 0449  NA 140 141 142 142  K 4.1 3.7 3.2* 3.5  CL 109 107 108 109  CO2 26 26 29 26   GLUCOSE 141* 123* 144* 129*  BUN 29* 29* 25* 21*  CALCIUM 8.2* 8.5* 8.3* 8.4*  CREATININE 1.48* 1.27* 0.94 0.93  GFRNONAA 42* 51* >60 >60  GFRAA 49* 59* >60 >60    LIVER FUNCTION TESTS:  Recent Labs  06/04/16 1706 06/05/16 0448 07/20/16 1126 07/21/16 0100  BILITOT 0.5 0.5 0.7 1.0  AST 20 18 24 17   ALT 15* 14* 15* 13*  ALKPHOS 80 84 94 71  PROT 6.9 6.8 7.4 6.0*  ALBUMIN 3.6 3.7 3.8 2.8*    Assessment and Plan: 1. Acute cholecystitis, s/p perc chole drain on 11/23 -pt improving -drain doing well.  We will have him follow up in clinic in 6 weeks for a drain injection/cholangiogram -he will also follow up with the surgeons to discuss surgical options in 6-8 weeks. -CX revealed enterococcus.  abx per primary team -cont flushing of this catheter at home with 5-10cc of NS daily -please call us with questions  Electronically Signed: Aracelly Tencza E 07/26/2016, 11:16 AM   I spent a total of 15 Minutes at the the patient's bedside AND on the patient's hospital floor or unit, greater than 50% of which was counseling/coordinating care for acute cholecystitis

## 2016-07-26 NOTE — Evaluation (Addendum)
Physical Therapy Evaluation Patient Details Name: NIMAI BURBACH MRN: [id number] DOB: 10-01-1931 Today's Date: 07/26/2016   History of Present Illness  82 y.o. male admitted with cholecystitis, s/p perc cholecystostomy drain 07/21/16.  Pt admitted from [facility] where he was doing rehab for R ankle fx sustained 06/08/16, he is NWB. PMH of OSA on CPAP, CKD, DM2, neuropathy. Pt is on Xarelto for acute PE.    Clinical Impression  Pt admitted with above diagnosis. Pt currently with functional limitations due to the deficits listed below (see PT Problem List). Pt was [facility] to this [facility]. He and wife stated they can manage WC mobility at home.  Pt will benefit from skilled PT to increase their independence and safety with mobility to allow discharge to the venue listed below.       Follow Up Recommendations No PT follow up (pt refusing HHPT, HHPT recommended once he's permitted to weight bear on RLE, he sees ortho Dr 08/02/16)    Equipment Recommendations  None recommended by PT    Recommendations for Other Services       Precautions / Restrictions Precautions Precautions: Fall Restrictions Weight Bearing Restrictions: Yes RLE Weight Bearing: Non weight bearing Other Position/Activity Restrictions: 2* R ankle fx 06/08/16. ankle in hard cast  Cholecystostomy drain on R    Mobility  Bed Mobility Overal bed mobility: Modified Independent             General bed mobility comments: with rail  Transfers Overall transfer level: Needs assistance   Transfers: Stand Pivot Transfers Sit to Stand: Min guard Stand pivot transfers: Min guard       General transfer comment: bed to recliner tx with min/guard assist for safety, used recliner armrests for UE support, no LOB, no physical assist needed  Ambulation/Gait             General Gait Details: deferred, NWB RLE, pt was only transferring at SNF PTA, stated he couldn't  hop on LLE due to poor balance  Stairs            Wheelchair Mobility    Modified Rankin (Stroke Patients Only)       Balance Overall balance assessment: Needs assistance   Sitting balance-Leahy Scale: Good       Standing balance-Leahy Scale: Poor                               Pertinent Vitals/Pain Pain Assessment: No/denies pain    Home Living Family/patient expects to be discharged to:: Private residence Living Arrangements: Spouse/significant other Available Help at Discharge: Family;Available 24 hours/day Type of Home: Apartment Home Access: Level entry     Home Layout: One level Home Equipment: Bedside commode;Cane - single point;Walker - 2 wheels;Walker - 4 wheels;Wheelchair - Education officer, community - power;Tub bench      Prior Function Level of Independence: Independent with assistive device(s)         Comments: pt was independent with WC transfers at SNF prior to this hospitalization, Independent with ADLs     Hand Dominance        Extremity/Trunk Assessment   Upper Extremity Assessment: Overall WFL for tasks assessed           Lower Extremity Assessment: RLE deficits/detail RLE Deficits / Details: ankle in hard cast, can wiggle toes, 4/5 knee ext, 3/5 SLR, knee/hip AROM WFL    Cervical / Trunk Assessment: Normal  Communication   Communication: HOH  Cognition Arousal/Alertness: Awake/alert Behavior During Therapy: WFL for tasks assessed/performed Overall Cognitive Status: Within Functional Limits for tasks assessed                      General Comments      Exercises General Exercises - Lower Extremity Quad Sets: AROM;Both;10 reps;Supine Heel Slides: AROM;Right;10 reps;Supine Hip ABduction/ADduction: AROM;Right;10 reps;Supine Straight Leg Raises: AROM;AAROM;Right;5 reps;Supine   Assessment/Plan    PT Assessment Patient needs continued PT services  PT Problem List Decreased strength;Decreased activity  tolerance;Decreased balance;Decreased mobility;Obesity          PT Treatment Interventions Therapeutic activities;Therapeutic exercise;Functional mobility training;Patient/family education    PT Goals (Current goals can be found in the Care Plan section)  Acute Rehab PT Goals Patient Stated Goal: to go home ASAP PT Goal Formulation: With patient/family Time For Goal Achievement: 08/09/16 Potential to Achieve Goals: Good    Frequency Min 3X/week   Barriers to discharge        Co-evaluation               End of Session Equipment Utilized During Treatment: Gait belt Activity Tolerance: Patient tolerated treatment well Patient left: in chair;with call bell/phone within reach;with family/visitor present (video monitoring) Nurse Communication: Mobility status         Time: 1610-9604 PT Time Calculation (min) (ACUTE ONLY): 21 min   Charges:   PT Evaluation $PT Eval Low Complexity: 1 Procedure     PT G Codes:        Philomena Doheny 07/26/2016, 10:46 AM [phone]

## 2016-07-26 NOTE — Progress Notes (Signed)
Patient ID: [name], male   DOB: 10/15/1931, 81 y.o.   MRN: [id number]  [facility] Surgery Progress Note     Subjective: States that he feels bloated/constipated today. Had very small BM yesterday. Denies n/v. Tolerating diet but not taking in very much.   Objective: Vital signs in last 24 hours: Temp:  [98.2 F (36.8 C)-98.7 F (37.1 C)] 98.7 F (37.1 C) (11/28 0429) Pulse Rate:  [76-82] 82 (11/28 0429) Resp:  [18-20] 18 (11/28 0429) BP: (147-167)/(71-81) 147/80 (11/28 0429) SpO2:  [95 %-98 %] 95 % (11/28 0429) Last BM Date: 07/24/16  Intake/Output from previous day: 11/27 0701 - 11/28 0700 In: 760 [P.O.:610; IV Piggyback:150] Out: 480 [Urine:200; Drains:280] Intake/Output this shift: Total I/O In: -  Out: 60 [Drains:60]  PE: Gen:  Alert, NAD, pleasant Pulm:  On O2, effort normal Abd: Soft, mildly distended, mild TTP lower abdomen, drain site clean, drain with minimal bilious fluid  Lab Results:   Recent Labs  07/25/16 0449 07/26/16 0503  WBC 10.3 10.9*  HGB 12.0* 12.0*  HCT 38.3* 38.6*  PLT 366 392   BMET  Recent Labs  07/24/16 0348 07/25/16 0449  NA 142 142  K 3.2* 3.5  CL 108 109  CO2 29 26  GLUCOSE 144* 129*  BUN 25* 21*  CREATININE 0.94 0.93  CALCIUM 8.3* 8.4*   PT/INR No results for input(s): LABPROT, INR in the last 72 hours. CMP     Component Value Date/Time   NA 142 07/25/2016 0449   NA 138 02/23/2016   K 3.5 07/25/2016 0449   CL 109 07/25/2016 0449   CO2 26 07/25/2016 0449   GLUCOSE 129 (H) 07/25/2016 0449   BUN 21 (H) 07/25/2016 0449   BUN 27 (A) 02/23/2016   CREATININE 0.93 07/25/2016 0449   CALCIUM 8.4 (L) 07/25/2016 0449   PROT 6.0 (L) 07/21/2016 0100   ALBUMIN 2.8 (L) 07/21/2016 0100   AST 17 07/21/2016 0100   ALT 13 (L) 07/21/2016 0100   ALKPHOS 71 07/21/2016 0100   BILITOT 1.0 07/21/2016 0100   GFRNONAA >60 07/25/2016 0449   GFRAA >60 07/25/2016 0449   Lipase  No results found for:  LIPASE     Studies/Results: Dg Ankle Right Port  Result Date: 07/25/2016 CLINICAL DATA:  Ankle fracture 1 month ago.  Follow-up. EXAM: PORTABLE RIGHT ANKLE - 2 VIEW COMPARISON:  06/04/2016.  CT 06/06/2016. FINDINGS: Two screws are noted in the posterior calcaneus. Plate and screw fixation device in the distal fibula. No hardware complicating feature. Slightly displaced medial malleolar fracture again noted with fracture line remaining evident. IMPRESSION: Internal fixation of the distal fibular fracture and calcaneal fracture posteriorly. Slightly displaced medial malleolar fracture again noted with fracture line remaining evident. Electronically Signed   By: Rolm Baptise M.D.   On: 07/25/2016 12:54    Anti-infectives: Anti-infectives    Start     Dose/Rate Route Frequency Ordered Stop   07/21/16 0000  piperacillin-tazobactam (ZOSYN) IVPB 3.375 g     3.375 g 12.5 mL/hr over 240 Minutes Intravenous Every 8 hours 07/20/16 2021     07/20/16 1545  piperacillin-tazobactam (ZOSYN) IVPB 3.375 g     3.375 g 100 mL/hr over 30 Minutes Intravenous  Once 07/20/16 1535 07/20/16 1702   07/20/16 1311  vancomycin (VANCOCIN) 1-5 GM/200ML-% IVPB    Comments:  Woodward Ku   : cabinet override      07/20/16 1311 07/21/16 0114   07/20/16 1245  vancomycin (VANCOCIN) IVPB  1000 mg/200 mL premix     1,000 mg 200 mL/hr over 60 Minutes Intravenous Every 1 hr x 2 07/20/16 1234 07/20/16 1444   07/20/16 1232  ceFEPIme (MAXIPIME) 1 g injection    Comments:  Eulogio Ditch   : cabinet override      07/20/16 1232 07/21/16 0044   07/20/16 1230  vancomycin (VANCOCIN) 1,769 mg in sodium chloride 0.9 % 500 mL IVPB  Status:  Discontinued     15 mg/kg  117.9 kg 250 mL/hr over 120 Minutes Intravenous  Once 07/20/16 1228 07/20/16 1234   07/20/16 1230  ceFEPIme (MAXIPIME) 1 g in dextrose 5 % 50 mL IVPB     1 g 100 mL/hr over 30 Minutes Intravenous  Once 07/20/16 1228 07/20/16 1305       Assessment/Plan Severe  cholecystitis s/p cholecystostomy tube 07/21/16 by IR (AT) - culture: FEW ENTEROCOCCUS FAECIUM, susceptibilities pending - output 280cc/24 hr Sepsis - improved ARF with RUL PNA Morbid obesity (Body mass index is 40) DM2 OSA Chronic back pain - dilaudid at home Right ankle fx with cast 06/08/16 - NWB PTSD/[place] Acute on CKD - Cr normalized Encephalopathy - improving Acute PE - heparin now switched to xarelto  VTE - xarelto FEN: Cardiac diet JH:ERDEY 07/20/16 =>> day 7  Plan:  WBC slightly up today 10.9 from 10.3, afebrile. Complaining of constipation, continue colace and will add miralax. Continue cholecystostomy drain, will stay in x6-8 weeks with drain clinic follow-up, and follow-up with Dr. Marcello Moores after that. Continue zosyn, susceptibilities pending.   LOS: 6 days    Jerrye Beavers , [facility] Surgery 07/26/2016, 8:43 AM Pager: [phone] Consults: [phone] Mon-Fri 7:00 am-4:30 pm Sat-Sun 7:00 am-11:30 am

## 2016-07-26 NOTE — Discharge Instructions (Signed)
Cholecystostomy, Care After Introduction Refer to this sheet in the next few weeks. These instructions provide you with information about caring for yourself after your procedure. Your health care provider may also give you more specific instructions. Your treatment has been planned according to current medical practices, but problems sometimes occur. Call your health care provider if you have any problems or questions after your procedure. What can I expect after the procedure? After your procedure, it is common to have soreness near the site of your drainage tube (catheter) or your incision. Follow these instructions at home: Incision care  Follow instructions from your health care provider about how to take care of your incision. Make sure you:  Wash your hands with soap and water before you change your bandage (dressing). If soap and water are not available, use hand sanitizer.  Change your dressing as told by your health care provider.  Leave stitches (sutures), skin glue, or adhesive strips in place. These skin closures may need to be in place for 2 weeks or longer. If adhesive strip edges start to loosen and curl up, you may trim the loose edges. Do not remove adhesive strips completely unless your health care provider tells you to do that.  Check your incision and your drainage site every day for signs of infection. Watch for:  Redness, swelling, or pain.  Fluid, blood, or pus. General instructions  If you were sent home with a surgical drain in place, follow instructions from your health care provider about how to care for your drain and collection bag at home.  Do not take baths, swim, or use a hot tub until your health care provider approves. Ask your health care provider if you can take showers. You may only be allowed to take sponge baths for bathing.  Follow instructions from your health care provider about what you may eat or drink.  Take over-the-counter and prescription  medicines only as told by your health care provider.  Keep all follow-up visits as told by your health care provider. This is important. Contact a health care provider if:  You have redness, swelling, or pain at your incision or drainage site.  You have nausea or vomiting. Get help right away if:  Your abdominal pain gets worse.  You feel dizzy or you faint while standing.  You have fluid, blood, or pus coming from your incision or drainage site.  You have a fever.  You have shortness of breath.  You have a rapid heartbeat.  Your nausea or vomiting does not go away.  Your drainage tube becomes blocked.  Your drainage tube comes out of your abdomen. This information is not intended to replace advice given to you by your health care provider. Make sure you discuss any questions you have with your health care provider. Document Released: 05/06/2015 Document Revised: 01/21/2016 Document Reviewed: 11/26/2014  2017 Elsevier  Information on my medicine - XARELTO (rivaroxaban)  This medication education was reviewed with me or my healthcare representative as part of my discharge preparation.  The pharmacist that spoke with me during my hospital stay was:  Sallyanne Havers, [place]? Xarelto was prescribed to treat blood clots that may have been found in the veins of your legs (deep vein thrombosis) or in your lungs (pulmonary embolism) and to reduce the risk of them occurring again.  What do you need to know about Xarelto? The starting dose is one 15 mg tablet taken TWICE daily with  food for the FIRST 21 DAYS then on  08/15/16  the dose is changed to one 20 mg tablet taken ONCE A DAY with your evening meal. DO NOT stop taking Xarelto without talking to the health care provider who prescribed the medication.  Refill your prescription for 20 mg tablets before you run out.  After discharge, you should have regular check-up appointments with  your healthcare provider that is prescribing your Xarelto.  In the future your dose may need to be changed if your kidney function changes by a significant amount.  What do you do if you miss a dose? If you are taking Xarelto TWICE DAILY and you miss a dose, take it as soon as you remember. You may take two 15 mg tablets (total 30 mg) at the same time then resume your regularly scheduled 15 mg twice daily the next day.  If you are taking Xarelto ONCE DAILY and you miss a dose, take it as soon as you remember on the same day then continue your regularly scheduled once daily regimen the next day. Do not take two doses of Xarelto at the same time.   Important Safety Information Xarelto is a blood thinner medicine that can cause bleeding. You should call your healthcare provider right away if you experience any of the following: ? Bleeding from an injury or your nose that does not stop. ? Unusual colored urine (red or dark brown) or unusual colored stools (red or black). ? Unusual bruising for unknown reasons. ? A serious fall or if you hit your head (even if there is no bleeding).  Some medicines may interact with Xarelto and might increase your risk of bleeding while on Xarelto. To help avoid this, consult your healthcare provider or pharmacist prior to using any new prescription or non-prescription medications, including herbals, vitamins, non-steroidal anti-inflammatory drugs (NSAIDs) and supplements.  This website has more information on Xarelto: [email or web address].[email or web address]

## 2016-07-26 NOTE — Progress Notes (Signed)
Pt complains of gas and abdominal discomfort. Pt states it is related to constipation. Pt not feeling relief from prn mylicon. On-call paged to update. Will pass on to day shift nurse and continue to monitor.

## 2016-07-26 NOTE — Progress Notes (Signed)
Patient has been alert and oriented x4 with no impulsive or agitated behavior. Remains cooperative and compliant. Family to remain at beside at all times. Order for [employer] discontinued. Eulas Post, RN

## 2016-07-26 NOTE — Progress Notes (Signed)
Patient ID: ELMO RIO, male   DOB: 04-07-1932, 82 y.o.   MRN: [id number]  PROGRESS NOTE    EYDAN CHIANESE  FAO:[id number] DOB: 11/06/1931 DOA: 07/20/2016  PCP: Stephens Shire, MD   Brief Narrative:   82 year old male with past medical history significant for obstructive sleep apnea on Cipro, hypertension, chronic kidney disease stage III, insulin-dependent diabetes mellitus, posttraumatic stress disorder, chronic back pain on oral Dilaudid who presented to hospital with abdominal pain and fever. The patient was recently admitted 10/7-10/11 for non-operative mgmt of right ankle fracture and discharged to SNF. Over past couple of days his abdominal pain just progressively got worse and he had associated nausea and fever. In addition, patient appear more confused as per family member report.  In ED, patient was febrile with Tmax 101.72F, HR in 90's, RR 20, oxygen saturation in low 90s on 2 L nasal cannula oxygen support. Blood pressure was 106/56. Blood work showed white blood cell count of 30, lactate 3.36, mild elevation in troponin 0.26. Urinalysis was without pyuria or hematuria. Chest x-ray showed small lung volumes. CT head showed no acute intracranial findings. CT angiogram of the chest and abdomen showed left small pulmonary artery embolism as well as emphysematous cholecystitis versus fistula. Patient was given vancomycin, Zosyn on the admission for sepsis and IV fluids and started on heparin drip. He was seen by surgery in consultation who recommended percutaneous cholecystostomy tube placement by IR which was done 11/23.  Assessment & Plan:   Principal Problem:   Sepsis secondary to emphysematous cholecystitis / Leukocytosis / abdominal pain, nausea - Sepsis criteria were met on the admission with fever, tachycardia, tachypnea, hypoxia, leukocytosis and lactic acidosis. Source of infection is emphysematous cholecystitis - Percutaneous cholecystostomy tube placed by IR11/23/2017 -  Appreciate IR following, drains in place - Analysis from this fluid culture is growing enterococcus faecium, susceptibility report to follow - Blood cultures so far negative - Continue zosyn  - WBC count improved to WNL but slightly up this am 10.9, no fever  - Lactic acid normalized  Active Problems:   Acute metabolic encephalopathy - Secondary to sepsis - Much more alert this am    Acute respiratory failure with hypoxia / right upper lung lobe pneumonia, unspecified organism - Stable respiratory status. Chest x-ray with possible pneumonia in the right upper lung lobe - Continue zosyn     Mild troponin elevation - Likely demand ischemia from sepsis and pneumonia - No complaints of chest pain - Troponin level 0.26 - Sinus rhythm on 12-lead EKG    Abdominal pain - Obtain abd xray, appreciate BS - Surgery added colace and miralax for bowel regimen        Morbid obesity due to excess calories - Body mass index is 40.16 kg/m. - Seen by nutritionist     Diabetes mellitus with diabetic nephropathy with long-term insulin use  - Continue Levemir 20 units at bedtime along with sliding scale insulin  - CBG's in past 24 hours: 96, 110, 99    OSA on CPAP - Stable respiratory status at this time - Chest x-ray 11/24 showed patchy airspace disease consistent with a degree of pneumonia in right upper lobe but lungs otherwise clear    CKD stage 3 (HCC) / Hypokalemia - Cr 1.5 on admission, slightly above baseline value of 1.25 about one month ago - Elevated creatinine likely secondary to sepsis - Creatinine subsequently normalized - Hypokalemia supplemented and normalized    Acute pulmonary embolism ([place]) - On  heparin drip initially but switched to Xarelto 11/27    Right leg cast for ankle fracture  - Plan was to take it off 12/4 [place] [facility] (on outpt basis) - Hairline frx still visible     DVT prophylaxis: Heparin drip --> xarelto  Code Status: full code  Family Communication:  wfie at the bedside this am Disposition Plan: Home possibly in next 2 days if he feels better   Consultants:   Surgery   Interventional radiology  Procedures:   Perc cholecystostomy 07/21/2016  LE doppler 11/23 - no DVT in right or left LE  Antimicrobials:   Zosyn 07/20/2016 -->  Vancomycin 1 dose given in ED    Subjective: No overnight events.   Objective: Vitals:   07/25/16 1332 07/25/16 2131 07/26/16 0318 07/26/16 0429  BP: (!) 157/71 (!) 167/81  (!) 147/80  Pulse: 82 76  82  Resp: '20 18  18  ' Temp: 98.5 F (36.9 C) 98.2 F (36.8 C)  98.7 F (37.1 C)  TempSrc: Oral Oral  Oral  SpO2: 98% 97% 96% 95%  Weight:      Height:        Intake/Output Summary (Last 24 hours) at 07/26/16 1108 Last data filed at 07/26/16 8850  Gross per 24 hour  Intake              520 ml  Output              540 ml  Net              -20 ml   Filed Weights   07/20/16 1100 07/20/16 1840  Weight: 117.9 kg (260 lb) 130.6 kg (287 lb 14.7 oz)    Examination:  General exam: Calm and comfortable, no distress Respiratory system: No wheezing, no rhonchi, bilateral air entry  Cardiovascular system: S1 & S2 heard, RRR Gastrointestinal system: (+) BS, distended but not tender; drain in place Central nervous system: No focal deficits Extremities: Cast right leg, no edema LLE, palpable pulses  Skin: warm and dry  Psychiatry: Normal mood and behavior, not restless   Data Reviewed: I have personally reviewed following labs and imaging studies  CBC:  Recent Labs Lab 07/22/16 0305 07/23/16 0351 07/23/16 0848 07/23/16 1334 07/24/16 0348 07/25/16 0449 07/26/16 0503  WBC 20.9* 16.4*  --   --  12.1* 10.3 10.9*  HGB 11.7* 12.0* 11.8* 11.8* 11.6* 12.0* 12.0*  HCT 36.7* 38.9* 37.6* 37.3* 36.5* 38.3* 38.6*  MCV 88.6 90.0  --   --  88.2 88.2 89.1  PLT 256 293  --   --  333 366 277   Basic Metabolic Panel:  Recent Labs Lab 07/21/16 0100 07/22/16 0305 07/23/16 0351 07/24/16 0348  07/25/16 0449  NA 135 140 141 142 142  K 4.0 4.1 3.7 3.2* 3.5  CL 102 109 107 108 109  CO2 '27 26 26 29 26  ' GLUCOSE 186* 141* 123* 144* 129*  BUN 27* 29* 29* 25* 21*  CREATININE 1.56* 1.48* 1.27* 0.94 0.93  CALCIUM 8.2* 8.2* 8.5* 8.3* 8.4*   GFR: Estimated Creatinine Clearance: 84.4 mL/min (by C-G formula based on SCr of 0.93 mg/dL). Liver Function Tests:  Recent Labs Lab 07/20/16 1126 07/21/16 0100  AST 24 17  ALT 15* 13*  ALKPHOS 94 71  BILITOT 0.7 1.0  PROT 7.4 6.0*  ALBUMIN 3.8 2.8*   No results for input(s): LIPASE, AMYLASE in the last 168 hours. No results for input(s): AMMONIA in the last 168  hours. Coagulation Profile:  Recent Labs Lab 07/21/16 0100  INR 1.42   Cardiac Enzymes:  Recent Labs Lab 07/20/16 1126 07/20/16 2039  TROPONINI 0.26* <0.03   BNP (last 3 results) No results for input(s): PROBNP in the last 8760 hours. HbA1C: No results for input(s): HGBA1C in the last 72 hours. CBG:  Recent Labs Lab 07/25/16 1632 07/25/16 2052 07/26/16 0024 07/26/16 0433 07/26/16 0834  GLUCAP 124* 117* 96 110* 99   Lipid Profile: No results for input(s): CHOL, HDL, LDLCALC, TRIG, CHOLHDL, LDLDIRECT in the last 72 hours. Thyroid Function Tests: No results for input(s): TSH, T4TOTAL, FREET4, T3FREE, THYROIDAB in the last 72 hours. Anemia Panel: No results for input(s): VITAMINB12, FOLATE, FERRITIN, TIBC, IRON, RETICCTPCT in the last 72 hours. Urine analysis:    Component Value Date/Time   COLORURINE AMBER (A) 07/20/2016 1151   APPEARANCEUR CLEAR 07/20/2016 1151   LABSPEC 1.026 07/20/2016 1151   PHURINE 5.5 07/20/2016 1151   GLUCOSEU 250 (A) 07/20/2016 1151   HGBUR NEGATIVE 07/20/2016 1151   BILIRUBINUR SMALL (A) 07/20/2016 1151   KETONESUR 15 (A) 07/20/2016 1151   PROTEINUR 30 (A) 07/20/2016 1151   UROBILINOGEN 1.0 11/22/2012 1633   NITRITE NEGATIVE 07/20/2016 1151   LEUKOCYTESUR NEGATIVE 07/20/2016 1151   Sepsis  Labs: '@[email or web address]' (procalcitonin:4,lacticidven:4)    Results for orders placed or performed during the hospital encounter of 07/20/16  Blood culture (routine x 2)     Status: None   Collection Time: 07/20/16 11:50 AM  Result Value Ref Range Status   Specimen Description BLOOD LEFT HAND  Final   Special Requests BOTTLES DRAWN AEROBIC AND ANAEROBIC 5CC EACH  Final   Culture   Final    NO GROWTH 5 DAYS Performed at [facility]    Report Status 07/25/2016 FINAL  Final  Blood culture (routine x 2)     Status: None   Collection Time: 07/20/16 11:55 AM  Result Value Ref Range Status   Specimen Description BLOOD LEFT AC  Final   Special Requests BOTTLES DRAWN AEROBIC AND ANAEROBIC 5CC EACH  Final   Culture   Final    NO GROWTH 5 DAYS Performed at [facility]    Report Status 07/25/2016 FINAL  Final  MRSA PCR Screening     Status: None   Collection Time: 07/20/16  5:46 PM  Result Value Ref Range Status   MRSA by PCR NEGATIVE NEGATIVE Final    Comment:        The GeneXpert MRSA Assay (FDA approved for NASAL specimens only), is one component of a comprehensive MRSA colonization surveillance program. It is not intended to diagnose MRSA infection nor to guide or monitor treatment for MRSA infections.   Body fluid culture     Status: None (Preliminary result)   Collection Time: 07/23/16 12:20 PM  Result Value Ref Range Status   Specimen Description GALL BLADDER FLUID  Final   Special Requests Normal  Final   Gram Stain   Final    RARE WBC PRESENT, PREDOMINANTLY MONONUCLEAR FEW GRAM POSITIVE COCCI IN PAIRS AND CHAINS RARE GRAM VARIABLE ROD    Culture   Final    FEW ENTEROCOCCUS FAECIUM CULTURE REINCUBATED FOR BETTER GROWTH Performed at [facility]    Report Status PENDING  Incomplete   Organism ID, Bacteria ENTEROCOCCUS FAECIUM  Final     MRSA PCR Screening     Status: None   Collection Time: 07/20/16  5:46 PM  Result Value Ref Range Status  MRSA by PCR NEGATIVE NEGATIVE Final      Radiology Studies:  Dg Ankle Right Port  Internal fixation of the distal fibular fracture and calcaneal fracture posteriorly. Slightly displaced medial malleolar fracture again noted with fracture line remaining evident. Electronically Signed   By: Rolm Baptise M.D.   On: 07/25/2016 12:54   Dg Chest Port 1 View Result Date: 07/22/2016 Patchy airspace disease consistent with a degree of pneumonia in the right upper lobe. Lungs elsewhere clear. Heart borderline enlarged. Aortic atherosclerosis. Electronically Signed   By: Lowella Grip III M.D.   On: 07/22/2016 10:49    Dg Chest 2 View Result Date: 07/20/2016 Low lung volumes with bibasilar atelectasis. Bibasilar pneumonia cannot be excluded. Small left pleural effusion.   Ct Head Wo Contrast Result Date: 07/20/2016 No acute abnormality.   Ct Angio Chest Pe W And/or Wo Contrast Result Date: 07/20/2016 Filling defect at the bifurcation of the LEFT lower lobe pulmonary artery consistent with a small age-indeterminate pulmonary embolus. Subsegmental atelectasis in BILATERAL lower lobes. Distended gallbladder with thickened wall, significant pericholecystic infiltrative changes, air within the gallbladder, and question minimal gas within the gallbladder wall. These findings could represent emphysematous cholecystitis or could be related to prior erosion of a gallstone from the gallbladder neck to the duodenum. Aortic atherosclerosis and mild coronary arterial calcification. BILATERAL inguinal hernias containing fat.   Ct Abdomen Pelvis W Contrast Result Date: 07/20/2016 Filling defect at the bifurcation of the LEFT lower lobe pulmonary artery consistent with a small age-indeterminate pulmonary embolus. Subsegmental atelectasis in BILATERAL lower lobes. Distended gallbladder with thickened wall, significant pericholecystic infiltrative changes, air within the gallbladder, and question minimal gas  within the gallbladder wall. These findings could represent emphysematous cholecystitis or could be related to prior erosion of a gallstone from the gallbladder neck to the duodenum. Aortic atherosclerosis and mild coronary arterial calcification. BILATERAL inguinal hernias containing fat.     Scheduled Meds: . docusate sodium  100 mg Oral BID        . insulin aspart  0-15 Units Subcutaneous Q4H  . insulin detemir  20 Units Subcutaneous QHS  . methocarbamol  750 mg Oral TID  . pantoprazole  40 mg Oral Daily  . piperacillin-tazobac  3.375 g Intravenous Q8H  . tamsulosin  0.4 mg Oral QPC supper   Continuous Infusions:    LOS: 6 days    Time spent: 25 minutes  Greater than 50% of the time spent on counseling and coordinating the care.   [name], MD [facility] Pager [phone]  If 7PM-7AM, please contact night-coverage [email or web address] Password [email or web address] 07/26/2016, 11:08 AM

## 2016-07-26 NOTE — Progress Notes (Signed)
Pt continues to refuse cpap.  Machine not in room.  Pt was encouraged to call should he change his mind.

## 2016-07-27 ENCOUNTER — Ambulatory Visit: Payer: Medicare Other | Admitting: Neurology

## 2016-07-27 DIAGNOSIS — E1122 Type 2 diabetes mellitus with diabetic chronic kidney disease: Secondary | ICD-10-CM | POA: Diagnosis not present

## 2016-07-27 DIAGNOSIS — Z8719 Personal history of other diseases of the digestive system: Secondary | ICD-10-CM | POA: Diagnosis not present

## 2016-07-27 DIAGNOSIS — A491 Streptococcal infection, unspecified site: Secondary | ICD-10-CM | POA: Diagnosis present

## 2016-07-27 DIAGNOSIS — G308 Other Alzheimer's disease: Secondary | ICD-10-CM | POA: Diagnosis not present

## 2016-07-27 DIAGNOSIS — E785 Hyperlipidemia, unspecified: Secondary | ICD-10-CM | POA: Diagnosis not present

## 2016-07-27 DIAGNOSIS — I2699 Other pulmonary embolism without acute cor pulmonale: Secondary | ICD-10-CM | POA: Diagnosis not present

## 2016-07-27 DIAGNOSIS — Z5189 Encounter for other specified aftercare: Secondary | ICD-10-CM | POA: Diagnosis not present

## 2016-07-27 DIAGNOSIS — Z4789 Encounter for other orthopedic aftercare: Secondary | ICD-10-CM | POA: Diagnosis not present

## 2016-07-27 DIAGNOSIS — S82839S Other fracture of upper and lower end of unspecified fibula, sequela: Secondary | ICD-10-CM | POA: Diagnosis not present

## 2016-07-27 DIAGNOSIS — G4709 Other insomnia: Secondary | ICD-10-CM | POA: Diagnosis not present

## 2016-07-27 DIAGNOSIS — S82831D Other fracture of upper and lower end of right fibula, subsequent encounter for closed fracture with routine healing: Secondary | ICD-10-CM | POA: Diagnosis not present

## 2016-07-27 DIAGNOSIS — E1169 Type 2 diabetes mellitus with other specified complication: Secondary | ICD-10-CM | POA: Diagnosis not present

## 2016-07-27 DIAGNOSIS — S82841D Displaced bimalleolar fracture of right lower leg, subsequent encounter for closed fracture with routine healing: Secondary | ICD-10-CM | POA: Diagnosis not present

## 2016-07-27 DIAGNOSIS — M25562 Pain in left knee: Secondary | ICD-10-CM | POA: Diagnosis not present

## 2016-07-27 DIAGNOSIS — E440 Moderate protein-calorie malnutrition: Secondary | ICD-10-CM | POA: Diagnosis not present

## 2016-07-27 DIAGNOSIS — S82839D Other fracture of upper and lower end of unspecified fibula, subsequent encounter for closed fracture with routine healing: Secondary | ICD-10-CM | POA: Diagnosis not present

## 2016-07-27 DIAGNOSIS — S82401D Unspecified fracture of shaft of right fibula, subsequent encounter for closed fracture with routine healing: Secondary | ICD-10-CM

## 2016-07-27 DIAGNOSIS — R2681 Unsteadiness on feet: Secondary | ICD-10-CM | POA: Diagnosis not present

## 2016-07-27 DIAGNOSIS — K265 Chronic or unspecified duodenal ulcer with perforation: Secondary | ICD-10-CM | POA: Diagnosis not present

## 2016-07-27 DIAGNOSIS — S82401A Unspecified fracture of shaft of right fibula, initial encounter for closed fracture: Secondary | ICD-10-CM | POA: Diagnosis present

## 2016-07-27 DIAGNOSIS — E1121 Type 2 diabetes mellitus with diabetic nephropathy: Secondary | ICD-10-CM

## 2016-07-27 DIAGNOSIS — R6520 Severe sepsis without septic shock: Secondary | ICD-10-CM | POA: Diagnosis not present

## 2016-07-27 DIAGNOSIS — D72828 Other elevated white blood cell count: Secondary | ICD-10-CM | POA: Diagnosis not present

## 2016-07-27 DIAGNOSIS — M25511 Pain in right shoulder: Secondary | ICD-10-CM | POA: Diagnosis not present

## 2016-07-27 DIAGNOSIS — M6281 Muscle weakness (generalized): Secondary | ICD-10-CM | POA: Diagnosis not present

## 2016-07-27 DIAGNOSIS — A419 Sepsis, unspecified organism: Secondary | ICD-10-CM

## 2016-07-27 DIAGNOSIS — G4733 Obstructive sleep apnea (adult) (pediatric): Secondary | ICD-10-CM | POA: Diagnosis not present

## 2016-07-27 DIAGNOSIS — S92909A Unspecified fracture of unspecified foot, initial encounter for closed fracture: Secondary | ICD-10-CM | POA: Diagnosis not present

## 2016-07-27 DIAGNOSIS — R5381 Other malaise: Secondary | ICD-10-CM | POA: Diagnosis not present

## 2016-07-27 DIAGNOSIS — S82891S Other fracture of right lower leg, sequela: Secondary | ICD-10-CM | POA: Diagnosis not present

## 2016-07-27 DIAGNOSIS — I25810 Atherosclerosis of coronary artery bypass graft(s) without angina pectoris: Secondary | ICD-10-CM | POA: Diagnosis not present

## 2016-07-27 DIAGNOSIS — F0391 Unspecified dementia with behavioral disturbance: Secondary | ICD-10-CM | POA: Diagnosis not present

## 2016-07-27 DIAGNOSIS — I10 Essential (primary) hypertension: Secondary | ICD-10-CM | POA: Diagnosis not present

## 2016-07-27 DIAGNOSIS — N400 Enlarged prostate without lower urinary tract symptoms: Secondary | ICD-10-CM | POA: Diagnosis not present

## 2016-07-27 DIAGNOSIS — K5901 Slow transit constipation: Secondary | ICD-10-CM | POA: Diagnosis not present

## 2016-07-27 DIAGNOSIS — K810 Acute cholecystitis: Secondary | ICD-10-CM | POA: Diagnosis not present

## 2016-07-27 DIAGNOSIS — E871 Hypo-osmolality and hyponatremia: Secondary | ICD-10-CM

## 2016-07-27 LAB — BODY FLUID CULTURE: SPECIAL REQUESTS: NORMAL

## 2016-07-27 LAB — BASIC METABOLIC PANEL
ANION GAP: 8 (ref 5–15)
BUN: 15 mg/dL (ref 6–20)
CALCIUM: 8.3 mg/dL — AB (ref 8.9–10.3)
CO2: 28 mmol/L (ref 22–32)
CREATININE: 0.91 mg/dL (ref 0.61–1.24)
Chloride: 105 mmol/L (ref 101–111)
Glucose, Bld: 144 mg/dL — ABNORMAL HIGH (ref 65–99)
Potassium: 3.5 mmol/L (ref 3.5–5.1)
SODIUM: 141 mmol/L (ref 135–145)

## 2016-07-27 LAB — CBC
HCT: 37.4 % — ABNORMAL LOW (ref 39.0–52.0)
Hemoglobin: 11.8 g/dL — ABNORMAL LOW (ref 13.0–17.0)
MCH: 27.8 pg (ref 26.0–34.0)
MCHC: 31.6 g/dL (ref 30.0–36.0)
MCV: 88.2 fL (ref 78.0–100.0)
PLATELETS: 380 10*3/uL (ref 150–400)
RBC: 4.24 MIL/uL (ref 4.22–5.81)
RDW: 14.2 % (ref 11.5–15.5)
WBC: 11.5 10*3/uL — AB (ref 4.0–10.5)

## 2016-07-27 LAB — GLUCOSE, CAPILLARY
GLUCOSE-CAPILLARY: 149 mg/dL — AB (ref 65–99)
GLUCOSE-CAPILLARY: 121 mg/dL — AB (ref 65–99)

## 2016-07-27 MED ORDER — ALBUTEROL SULFATE (2.5 MG/3ML) 0.083% IN NEBU: 2.5000 mg | INHALATION_SOLUTION | Freq: Four times a day (QID) | RESPIRATORY_TRACT | 12 refills | Status: DC | PRN

## 2016-07-27 MED ORDER — MINERAL OIL RE ENEM
1.0000 | ENEMA | Freq: Once | RECTAL | Status: AC
  Administered 2016-07-27: 1 via RECTAL
  Filled 2016-07-27: qty 1

## 2016-07-27 MED ORDER — DIPHENHYDRAMINE HCL 25 MG PO CAPS
25.0000 mg | ORAL_CAPSULE | Freq: Once | ORAL | Status: DC
  Filled 2016-07-27: qty 1

## 2016-07-27 MED ORDER — LINEZOLID 600 MG PO TABS: 600.0000 mg | ORAL_TABLET | Freq: Two times a day (BID) | ORAL | 0 refills | Status: DC

## 2016-07-27 MED ORDER — RIVAROXABAN (XARELTO) VTE STARTER PACK (15 & 20 MG): ORAL_TABLET | ORAL | 0 refills | Status: DC

## 2016-07-27 MED ORDER — OXYCODONE-ACETAMINOPHEN 10-325 MG PO TABS: 1.0000 | ORAL_TABLET | ORAL | 0 refills | Status: DC | PRN

## 2016-07-27 MED ORDER — PREMIER PROTEIN SHAKE: 11.0000 [oz_av] | Freq: Every day | ORAL | 0 refills | Status: DC

## 2016-07-27 MED ORDER — HYDROCOD POLST-CPM POLST ER 10-8 MG/5ML PO SUER
5.0000 mL | Freq: Two times a day (BID) | ORAL | Status: DC
  Administered 2016-07-27: 5 mL via ORAL
  Filled 2016-07-27: qty 5

## 2016-07-27 MED ORDER — GALANTAMINE HYDROBROMIDE ER 8 MG PO CP24: 16.0000 mg | ORAL_CAPSULE | Freq: Every day | ORAL | Status: DC

## 2016-07-27 MED ORDER — LINEZOLID 600 MG PO TABS
600.0000 mg | ORAL_TABLET | Freq: Two times a day (BID) | ORAL | Status: DC
  Administered 2016-07-27: 600 mg via ORAL
  Filled 2016-07-27 (×2): qty 1

## 2016-07-27 MED ORDER — RIVAROXABAN (XARELTO) EDUCATION KIT FOR DVT/PE PATIENTS: 1.0000 | PACK | Freq: Once | 0 refills | Status: AC

## 2016-07-27 MED ORDER — METHOCARBAMOL 750 MG PO TABS: 750.0000 mg | ORAL_TABLET | Freq: Four times a day (QID) | ORAL | 0 refills | Status: AC | PRN

## 2016-07-27 MED ORDER — HYDROCOD POLST-CPM POLST ER 10-8 MG/5ML PO SUER: 5.0000 mL | Freq: Two times a day (BID) | ORAL | 0 refills | Status: DC

## 2016-07-27 MED ORDER — INSULIN DETEMIR 100 UNIT/ML ~~LOC~~ SOLN: 30.0000 [IU] | Freq: Every day | SUBCUTANEOUS | 11 refills | Status: DC

## 2016-07-27 NOTE — Clinical Social Work Placement (Signed)
CSW received call from [facility] that patient & wife, [name] are requesting that patient go to [facility] at discharge. CSW completed FL2 and confirmed with Ivin Booty at [facility] that they would be able to offer a bed - anticipating possible discharge tomorrow.    Raynaldo Opitz, [facility] Clinical Social Worker cell #: [phone]    CLINICAL SOCIAL WORK PLACEMENT  NOTE  Date:  07/27/2016  Patient Details  Name: [name] MRN: [id number] Date of Birth: 1932/06/14  Clinical Social Work is seeking post-discharge placement for this patient at the [place] level of care (*CSW will initial, date and re-position this form in  chart as items are completed):  Yes   Patient/family provided with [facility] Work Department's list of facilities offering this level of care within the geographic area requested by the patient (or if unable, by the patient's family).  Yes   Patient/family informed of their freedom to choose among providers that offer the needed level of care, that participate in Medicare, Medicaid or managed care program needed by the patient, have an available bed and are willing to accept the patient.  Yes   Patient/family informed of [facility]'s ownership interest in [facility] and [facility], as well as of the fact that they are under no obligation to receive care at these facilities.  PASRR submitted to EDS on       PASRR number received on       Existing PASRR number confirmed on 07/27/16     FL2 transmitted to all facilities in geographic area requested by pt/family on 07/27/16     FL2 transmitted to all facilities within larger geographic area on       Patient informed that his/her managed care company has contracts with or will negotiate with certain facilities, including the following:        Yes   Patient/family informed of bed offers received.  Patient chooses bed at [facility]      Physician recommends and patient chooses bed at      Patient to be transferred to [facility] on  .  Patient to be transferred to facility by       Patient family notified on   of transfer.  Name of family member notified:        PHYSICIAN       Additional Comment:    _______________________________________________ Standley Brooking, LCSW 07/27/2016, 11:52 AM

## 2016-07-27 NOTE — Progress Notes (Signed)
Pt transferred to [facility] via [facility] at this time. [facility] staff in possession of transfer packet w/ scripts and AVS. Pt in stable condition at time of transfer. Pt wife has left w/ all of pt's belongings.

## 2016-07-27 NOTE — Discharge Summary (Addendum)
Physician Discharge Summary  [name] ZOX:[id number] DOB: 02/03/1932 DOA: 07/20/2016  PCP: Stephens Shire, MD  Admit date: 07/20/2016 Discharge date: 07/27/2016  Admitted From: Skilled nursing facility  Disposition: Skilled nursing facility [facility])  Recommendations for Outpatient Follow-up:  1. Follow up with M.D. at SNF in 1 week. 2. Patient is being discharged on linezolid for 3 weeks duration (stop date 08/17/2016). Please monitor CBC at least once a week for possible thrombocytopenia while on this antibiotic. 3. Patient is being discharged on Xarelto for acute pulmonary embolism. Recommend at least 6 months of anticoagulation. (Stop date 01/22/2017.) 4. Patient should follow-up with [facility] orthopedics (Dr. Doran Durand) in one week for his right ankle fracture. Recommend non-weighted dating in right leg. 5. Patient is being discharged on cholecystostomy drain and should follow-up with IR in 6 weeks for his cholecystostomy and with surgery (Dr. Marcello Moores) thereafter.   Equipment/Devices: As per therapy at the facility  Discharge Condition: Stable CODE STATUS: Full code Diet recommendation:  Carb Modified     Discharge Diagnoses:  Principal Problem:   Emphysematous cholecystitis s/p perc cholecystostomy drain  07/21/2016   Active Problems:   Acute pulmonary embolism (HCC)   Hyponatremia   Enterococcal infection   Closed right fibular fracture    Obesity   Essential hypertension, benign   OSA on CPAP   CKD stage 3 due to type 2 diabetes mellitus (HCC)   Type 2 diabetes with nephropathy (HCC)   Pressure injury of skin   Brief narrative/history of present illness Please refer to admission H&P for details, in brief, 82 year old morbidly obese male with history of obstructive sleep apnea on CPAP, hypertension, chronic kidney disease stage III, insulin-dependent diabetes mellitus, postoperative stress disorder, chronic back pain, was hospitalized from 10/7-10/11 for  right ankle fracture with a cast applied and plan on outpatient follow-up. He was discharged to SNF. Patient was seen in the orthopedic clinic after few weeks and the cast was reapplied and recommended nonweightbearing. Patient was being discharged from SNF on the day of admission however for the past 3 days he was having vague diffuse moderate intensity abdominal discomfort which worsened on the night prior to admission. The pain was diffuse. The next morning when the family arrived he appeared confused and they brought him to the ED.  At [facility] ED  he was septic with fever of 101.70F, mildly tachypneic and pulse ox in low 90s on 2 L via nasal cannula. Blood pressure was stable. Blood work showed leukocytosis with WBC of 30 K, lactate of 3.36 and mildly elevated troponin of 0.26. Head CT, chest x-ray and UA was unremarkable. CT angiogram of the chest and abdomen showed a left small pulmonary embolism and emphysematous cholecystitis. He was placed on empiric vancomycin and Zosyn, given 2 L IV fluid bolus for sepsis. He was started on IV heparin drip and transferred to [facility] long hospital stepdown unit for further management.    Hospital course   Principle problem Sepsis secondary to emphysematous cholecystitis/enterococcal feceum infection. -Sepsis is resolved. Percutaneous cholecystostomy tube placed by IR on 07/21/2016. Culture growing enterococcus feceum (resistant to ampicillin and sensitive to vancomycin and gentamicin synergy) and staph epidermidis. (Mostly sensitive). -Blood cultures negative. --Discussed with ID Dr. Drucilla Schmidt on the phone. Recommended linezolid for 3 weeks duration. Need to monitor for thrombocytopenia. -Cholecystostomy drain functioning well and will be discharged on it. Patient is to follow-up with interventional radiology in 6 weeks.   Active problems Acute respiratory failure with  hypoxia/left lower lobe pulmonary embolism. Acute pulmonary embolism  likely secondary to lower extremity DVT from recent ankle fracture. Started on Xarelto. Should be treated for at least 6 months (stop date 01/22/2017). Maintaining O2 sat on room air. Pneumonia seems unlikely. (Patient did receive a 6-7 day course of IV Zosyn while in the hospital). Added antitussive for nonproductive cough. Continue albuterol nebs as needed.  Right ankle fracture -Patient hospitalized for fall 6 weeks back, seen by [facility] orthopedics and recommended conservative management with cast applied and nonweighted bearing on right leg. He was then seen in the office a few weeks later. Repeat ankle x-ray done here again show slightly displaced medial malleolar fracture with fracture line remaining evident. -Patient had an appointment next week but canceled since he was in the hospital. I have contacted [facility] orthopedics and spoken with the on-call surgeon Dr Stann Mainland, who has with Dr. Doran Durand and recommends outpatient follow-up in one week (office will call). Patient would need weightbearing x-rays to be done in the office. -Pain control with when necessary oxycodone. Recommend nonweighted dating on right leg.  Chronic kidney disease stage III Renal function at baseline.  Hypokalemia Replenished  Diabetes mellitus type 2 with diabetic nephropathy with long-term use of insulin Levemir dose reduced to 30 units at bedtime, continue sliding scale insulin.  Mild troponin elevation Possibly demand ischemia from sepsis. Asymptomatic.  Muscle spasms Continue Robaxin when necessary   BPH Continue Flomax  Constipation Received enema and continued bowel regimen.   OSA on CPAP  Morbid obesity     Family communication: Wife and son at bedside  Consults: [place] [facility] IR  Disposition: SNF   Discharge Instructions     Medication List    STOP taking these medications   HYDROmorphone 2 MG tablet Commonly known as:  DILAUDID     TAKE these medications    albuterol (2.5 MG/3ML) 0.083% nebulizer solution Commonly known as:  PROVENTIL Take 3 mLs (2.5 mg total) by nebulization every 6 (six) hours as needed for wheezing or shortness of breath.   carvedilol 3.125 MG tablet Commonly known as:  COREG Take 3.125 mg by mouth 2 (two) times daily with a meal.   chlorpheniramine-HYDROcodone 10-8 MG/5ML Suer Commonly known as:  TUSSIONEX Take 5 mLs by mouth every 12 (twelve) hours.   cyanocobalamin 500 MCG tablet Take 500 mcg by mouth 2 (two) times daily.   docusate sodium 100 MG capsule Commonly known as:  COLACE Take 1 capsule (100 mg total) by mouth 2 (two) times daily. While taking narcotic pain medicine. What changed:  additional instructions   galantamine 16 MG 24 hr capsule Commonly known as:  RAZADYNE ER Take 16 mg by mouth daily with breakfast.   GENTEAL MILD TO MODERATE 0.3 % Soln Generic drug:  Hypromellose Apply 1 drop to eye 2 (two) times daily.   insulin aspart 100 UNIT/ML injection Commonly known as:  novoLOG Inject 0-15 Units into the skin 3 (three) times daily with meals. Per sensitive sliding scale What changed:  when to take this  reasons to take this  additional instructions   insulin detemir 100 UNIT/ML injection Commonly known as:  LEVEMIR Inject 0.3 mLs (30 Units total) into the skin at bedtime. What changed:  how much to take   isosorbide mononitrate 30 MG 24 hr tablet Commonly known as:  IMDUR Take 30 mg by mouth every morning.   linezolid 600 MG tablet Commonly known as:  ZYVOX Take 1 tablet (600 mg total) by mouth  every 12 (twelve) hours.   magnesium oxide 400 MG tablet Commonly known as:  MAG-OX Take 400 mg by mouth 2 (two) times daily.   methocarbamol 750 MG tablet Commonly known as:  ROBAXIN Take 1 tablet (750 mg total) by mouth every 6 (six) hours as needed for muscle spasms.   oxyCODONE-acetaminophen 10-325 MG tablet Commonly known as:  PERCOCET Take 1 tablet by mouth every 4 (four)  hours as needed for pain.   pantoprazole 40 MG tablet Commonly known as:  PROTONIX Take 1 tablet (40 mg total) by mouth daily. What changed:  when to take this   polyethylene glycol packet Commonly known as:  MIRALAX / GLYCOLAX Take 17 g by mouth daily.   PRESERVISION AREDS 2 PO Take 1 capsule by mouth 2 (two) times daily.   protein supplement shake Liqd Commonly known as:  PREMIER PROTEIN Take 325 mLs (11 oz total) by mouth daily. Start taking on:  07/28/2016   QUEtiapine 50 MG tablet Commonly known as:  SEROQUEL Take 50 mg by mouth at bedtime.   Rivaroxaban 15 & 20 MG Tbpk Take as directed on package: Start with one 64m tablet by mouth twice a day with food. On Day 22, switch to one 214mtablet once a day with food.   rivaroxaban Kit Commonly known as:  XARELTO 1 kit by Does not apply route once.   simvastatin 40 MG tablet Commonly known as:  ZOCOR Take 40 mg by mouth every evening.   tamsulosin 0.4 MG Caps capsule Commonly known as:  FLOMAX Take 1 capsule (0.4 mg total) by mouth daily after supper.   Vitamin D2 2000 units Tabs Take 2,000 Units by mouth 2 (two) times daily.      Follow-up Information    THRosario Adie MD Follow up.   Specialty:  General Surgery Why:  Call for an appointment in 2 weeks after discharge.  You can tell them it is for follow up of  your gallbladder drain.  Follow up with the drain clinic should be arranged by the Radiology drain clinic, Contact information: [phone]        SHGreggory KeenMD Follow up in 6 week(s).   Specialty:  Interventional Radiology Why:  our office will call you with appointment date and time Contact information: [phone]      HEWylene SimmerMD. Call in 1 week(s).   Specialty:  Orthopedic Surgery Why:  OFFICE SHOULD CALL WITH APPT. if you do not hear from the offcie in 2 days, please call to  schedule appt Contact information: [phone]        No Known Allergies    Procedures/Studies: Dg Chest 2 View  Result Date: 07/20/2016 CLINICAL DATA:  Cough. EXAM: CHEST  2 VIEW COMPARISON:  06/05/2016. FINDINGS: Mediastinum and hilar structures are normal. Heart size normal. Low lung volumes with bibasilar atelectasis. Bibasilar pneumonia cannot be excluded. Small left pleural effusion. No pneumothorax. IMPRESSION: Low lung volumes with bibasilar atelectasis. Bibasilar pneumonia cannot be excluded. Small left pleural effusion. Electronically Signed   By: ThMarcello MooresRegister   On: 07/20/2016 12:34   Ct Head Wo Contrast  Result Date: 07/20/2016 CLINICAL DATA:  Altered mental status today.  Initial encounter. EXAM: CT HEAD WITHOUT CONTRAST TECHNIQUE: Contiguous axial images were obtained from the base of the skull through the  vertex without intravenous contrast. COMPARISON:  None. FINDINGS: Brain: There is some chronic microvascular ischemic change. No evidence of acute abnormality including infarct, hemorrhage, mass lesion, mass effect, midline shift or abnormal extra-axial fluid collection. No hydrocephalus or pneumocephalus. Vascular: Atherosclerosis noted. Skull: Intact. Sinuses/Orbits: Negative. Other: None. IMPRESSION: No acute abnormality. Electronically Signed   By: Inge Rise M.D.   On: 07/20/2016 14:53   Ct Angio Chest Pe W And/or Wo Contrast  Result Date: 07/20/2016 CLINICAL DATA:  Recent ankle fracture, hospitalized, cast on RIGHT leg, today not breathing RIGHT, altered mental status, abdominal pain since yesterday, history diabetes mellitus, hypertension, stage III chronic kidney disease EXAM: CT ANGIOGRAPHY CHEST CT ABDOMEN AND PELVIS WITH CONTRAST TECHNIQUE: Multidetector CT imaging of the chest was performed using the standard protocol during bolus administration of intravenous contrast. Multiplanar CT image  reconstructions and MIPs were obtained to evaluate the vascular anatomy. Multidetector CT imaging of the abdomen and pelvis was performed using the standard protocol during bolus administration of intravenous contrast. CONTRAST:  80 mL of Isovue 370 IV.  No oral contrast. COMPARISON:  CTA chest 02/11/2016, CT abdomen pelvis 11/17/2012 FINDINGS: CTA CHEST FINDINGS Cardiovascular: Atherosclerotic calcifications aorta and coronary arteries. Aorta normal caliber without aneurysm or dissection. Dilated central pulmonary arteries question pulmonary arterial hypertension. Single small filling defect identified in within the LEFT lower lobe pulmonary artery at its bifurcation compatible with a small age-indeterminate pulmonary embolus. No additional pulmonary emboli identified. No pericardial effusion. Mediastinum/Nodes: Base of cervical region unremarkable. No definite esophageal abnormalities. No thoracic adenopathy. Lungs/Pleura: Subsegmental atelectasis in BILATERAL lower lobes greater on RIGHT. No pulmonary infiltrate, pleural effusion, or pneumothorax. Musculoskeletal: Unremarkable Review of the MIP images confirms the above findings. CT ABDOMEN and PELVIS FINDINGS Hepatobiliary: Normal appearing liver. No biliary dilatation or biliary air. Thickened gallbladder wall with extensive pericholecystic infiltrative changes. Air within gallbladder lumen both at the fundus and at the neck/cystic duct extending to adjacent to the duodenal wall. Additional foci of gas are seen at the lower gallbladder segment, question intraluminal versus within wall. This could either represent emphysematous cholecystitis or prior erosion of a gallstone from the gallbladder neck into the duodenum. No gallstone identified within bowel nor evidence of gallstone ileus. Pancreas: Atrophic pancreas without mass Spleen: Normal appearance Adrenals/Urinary Tract: Tiny exophytic cyst at the inferomedial LEFT kidney 1.6 x 1.3 cm image 52. Adrenal  glands, kidneys, and ureters otherwise normal appearance. Bladder decompressed. Unremarkable prostate gland. Stomach/Bowel: Mild edema adjacent to the wall of the second portion of the duodenum extending to the proximal third duodenum with only minimal wall thickening; this is felt to be secondary to the adjacent gallbladder process rather than a primary duodenal process. Normal appendix. Scattered distal colonic diverticulosis. Stomach and bowel loops otherwise normal appearance. Vascular/Lymphatic: Atherosclerotic calcifications aorta and iliac arteries, including at the proximal SMA and celiac artery as well as the renal artery origins. No significant adenopathy Reproductive: N/A Other: No free air or free fluid. Small BILATERAL inguinal hernias containing fat. Musculoskeletal: Osseous structures unremarkable. Review of the MIP images confirms the above findings. IMPRESSION: Filling defect at the bifurcation of the LEFT lower lobe pulmonary artery consistent with a small age-indeterminate pulmonary embolus. Subsegmental atelectasis in BILATERAL lower lobes. Distended gallbladder with thickened wall, significant pericholecystic infiltrative changes, air within the gallbladder, and question minimal gas within the gallbladder wall. These findings could represent emphysematous cholecystitis or could be related to prior erosion of a gallstone from the gallbladder neck to the duodenum. Aortic atherosclerosis and  mild coronary arterial calcification. BILATERAL inguinal hernias containing fat. Findings called to Dr. Dayna Barker On 07/20/2016 at 1533 hours. Electronically Signed   By: Lavonia Dana M.D.   On: 07/20/2016 15:34   Ct Abdomen Pelvis W Contrast  Result Date: 07/20/2016 CLINICAL DATA:  Recent ankle fracture, hospitalized, cast on RIGHT leg, today not breathing RIGHT, altered mental status, abdominal pain since yesterday, history diabetes mellitus, hypertension, stage III chronic kidney disease EXAM: CT  ANGIOGRAPHY CHEST CT ABDOMEN AND PELVIS WITH CONTRAST TECHNIQUE: Multidetector CT imaging of the chest was performed using the standard protocol during bolus administration of intravenous contrast. Multiplanar CT image reconstructions and MIPs were obtained to evaluate the vascular anatomy. Multidetector CT imaging of the abdomen and pelvis was performed using the standard protocol during bolus administration of intravenous contrast. CONTRAST:  80 mL of Isovue 370 IV.  No oral contrast. COMPARISON:  CTA chest 02/11/2016, CT abdomen pelvis 11/17/2012 FINDINGS: CTA CHEST FINDINGS Cardiovascular: Atherosclerotic calcifications aorta and coronary arteries. Aorta normal caliber without aneurysm or dissection. Dilated central pulmonary arteries question pulmonary arterial hypertension. Single small filling defect identified in within the LEFT lower lobe pulmonary artery at its bifurcation compatible with a small age-indeterminate pulmonary embolus. No additional pulmonary emboli identified. No pericardial effusion. Mediastinum/Nodes: Base of cervical region unremarkable. No definite esophageal abnormalities. No thoracic adenopathy. Lungs/Pleura: Subsegmental atelectasis in BILATERAL lower lobes greater on RIGHT. No pulmonary infiltrate, pleural effusion, or pneumothorax. Musculoskeletal: Unremarkable Review of the MIP images confirms the above findings. CT ABDOMEN and PELVIS FINDINGS Hepatobiliary: Normal appearing liver. No biliary dilatation or biliary air. Thickened gallbladder wall with extensive pericholecystic infiltrative changes. Air within gallbladder lumen both at the fundus and at the neck/cystic duct extending to adjacent to the duodenal wall. Additional foci of gas are seen at the lower gallbladder segment, question intraluminal versus within wall. This could either represent emphysematous cholecystitis or prior erosion of a gallstone from the gallbladder neck into the duodenum. No gallstone identified within  bowel nor evidence of gallstone ileus. Pancreas: Atrophic pancreas without mass Spleen: Normal appearance Adrenals/Urinary Tract: Tiny exophytic cyst at the inferomedial LEFT kidney 1.6 x 1.3 cm image 52. Adrenal glands, kidneys, and ureters otherwise normal appearance. Bladder decompressed. Unremarkable prostate gland. Stomach/Bowel: Mild edema adjacent to the wall of the second portion of the duodenum extending to the proximal third duodenum with only minimal wall thickening; this is felt to be secondary to the adjacent gallbladder process rather than a primary duodenal process. Normal appendix. Scattered distal colonic diverticulosis. Stomach and bowel loops otherwise normal appearance. Vascular/Lymphatic: Atherosclerotic calcifications aorta and iliac arteries, including at the proximal SMA and celiac artery as well as the renal artery origins. No significant adenopathy Reproductive: N/A Other: No free air or free fluid. Small BILATERAL inguinal hernias containing fat. Musculoskeletal: Osseous structures unremarkable. Review of the MIP images confirms the above findings. IMPRESSION: Filling defect at the bifurcation of the LEFT lower lobe pulmonary artery consistent with a small age-indeterminate pulmonary embolus. Subsegmental atelectasis in BILATERAL lower lobes. Distended gallbladder with thickened wall, significant pericholecystic infiltrative changes, air within the gallbladder, and question minimal gas within the gallbladder wall. These findings could represent emphysematous cholecystitis or could be related to prior erosion of a gallstone from the gallbladder neck to the duodenum. Aortic atherosclerosis and mild coronary arterial calcification. BILATERAL inguinal hernias containing fat. Findings called to Dr. Dayna Barker On 07/20/2016 at 1533 hours. Electronically Signed   By: Lavonia Dana M.D.   On: 07/20/2016 15:34   Ir  Perc Cholecystostomy  Result Date: 07/21/2016 INDICATION: ACUTE CHOLECYSTITIS EXAM:  CHOLECYSTOSTOMY MEDICATIONS: ZOSYN 3.375 G; The antibiotic was administered within an appropriate time frame prior to the initiation of the procedure. ANESTHESIA/SEDATION: Moderate (conscious) sedation was employed during this procedure. A total of Versed 2.0 mg and Fentanyl 50 mcg was administered intravenously. Moderate Sedation Time: 15 minutes. The patient's level of consciousness and vital signs were monitored continuously by radiology nursing throughout the procedure under my direct supervision. FLUOROSCOPY TIME:  Fluoroscopy Time:  36 seconds (36 mGy). COMPLICATIONS: None immediate. PROCEDURE: Informed written consent was obtained from the patient after a thorough discussion of the procedural risks, benefits and alternatives. All questions were addressed. Maximal Sterile Barrier Technique was utilized including caps, mask, sterile gowns, sterile gloves, sterile drape, hand hygiene and skin antiseptic. A timeout was performed prior to the initiation of the procedure. Previous imaging reviewed. Preliminary ultrasound performed. Gallbladder localized in the right upper quadrant in the mid axillary line through a lower intercostal space. Under sterile conditions and local anesthesia, ultrasound percutaneous transhepatic needle access performed of the distended gallbladder. Needle position confirmed with ultrasound. There was return of bile. Guidewire inserted followed by Accustick dilator set. Amplatz guidewire advanced into the gallbladder. Tract dilatation performed to insert a 10 [place] drain. Retention loop formed in the gallbladder. Position confirmed with fluoroscopy. Images obtained for documentation. Catheter secured with a Prolene suture and sterile dressing. Catheter connected to external gravity drainage bag. IMPRESSION: Successful ultrasound and fluoroscopic 10 French percutaneous cholecystostomy as above. Electronically Signed   By: Jerilynn Mages.  Shick M.D.   On: 07/21/2016 11:33   Dg Chest Port 1  View  Result Date: 07/22/2016 CLINICAL DATA:  Cough and wheezing EXAM: PORTABLE CHEST 1 VIEW COMPARISON:  Chest radiograph and chest CT July 20, 2016 FINDINGS: There is new patchy airspace consolidation in the right lower lobe. Lungs elsewhere are clear. Heart is borderline enlarged with pulmonary vascularity within normal limits. No adenopathy. There is degenerative change in each shoulder. There is atherosclerotic calcification in the aorta. IMPRESSION: Patchy airspace disease consistent with a degree of pneumonia in the right upper lobe. Lungs elsewhere clear. Heart borderline enlarged. Aortic atherosclerosis. Electronically Signed   By: Lowella Grip III M.D.   On: 07/22/2016 10:49   Dg Ankle Right Port  Result Date: 07/25/2016 CLINICAL DATA:  Ankle fracture 1 month ago.  Follow-up. EXAM: PORTABLE RIGHT ANKLE - 2 VIEW COMPARISON:  06/04/2016.  CT 06/06/2016. FINDINGS: Two screws are noted in the posterior calcaneus. Plate and screw fixation device in the distal fibula. No hardware complicating feature. Slightly displaced medial malleolar fracture again noted with fracture line remaining evident. IMPRESSION: Internal fixation of the distal fibular fracture and calcaneal fracture posteriorly. Slightly displaced medial malleolar fracture again noted with fracture line remaining evident. Electronically Signed   By: Rolm Baptise M.D.   On: 07/25/2016 12:54   Dg Abd Portable 1v  Result Date: 07/26/2016 CLINICAL DATA:  Abdominal distention for 3 days EXAM: PORTABLE ABDOMEN - 1 VIEW COMPARISON:  CT abdomen and pelvis July 20, 2016 FINDINGS: There is moderate stool in the colon. There is no bowel dilatation or air-fluid level suggesting bowel obstruction. No free air. Drain noted in right abdomen. Elevation of right hemidiaphragm present. IMPRESSION: Moderate stool in colon. No bowel obstruction or free air evident. Drain in right abdomen present. Electronically Signed   By: Lowella Grip III  M.D.   On: 07/26/2016 14:49       Subjective: Abdominal pain better.  Complains of nonproductive cough.  Discharge Exam: Vitals:   07/27/16 0348 07/27/16 1300  BP: (!) 146/65 (!) 138/52  Pulse: 85 87  Resp: 20 20  Temp: 98.8 F (37.1 C) 99 F (37.2 C)   Vitals:   07/26/16 1330 07/26/16 2050 07/27/16 0348 07/27/16 1300  BP: (!) 169/90 124/68 (!) 146/65 (!) 138/52  Pulse: 85 85 85 87  Resp: '16 18 20 20  ' Temp: 98.9 F (37.2 C) 99.1 F (37.3 C) 98.8 F (37.1 C) 99 F (37.2 C)  TempSrc: Oral Oral Oral Oral  SpO2: 93% 97% 94% 94%  Weight:      Height:        General:Elderly obese male not in distress HEENT: Moist mucosa, supple neck  Cardiovascular: RRR, S1/S2 +, no rubs, no gallops Chest:  CTA bilaterally, no wheezing, no rhonchi Abdominal: Soft,nondistended, cholecystostomy with the drain, minimal tenderness, bowel sounds present  Extremities:Warm, no edema, right leg cast CNS: Alert and oriented   The results of significant diagnostics from this hospitalization (including imaging, microbiology, ancillary and laboratory) are listed below for reference.     Microbiology: Recent Results (from the past 240 hour(s))  Blood culture (routine x 2)     Status: None   Collection Time: 07/20/16 11:50 AM  Result Value Ref Range Status   Specimen Description BLOOD LEFT HAND  Final   Special Requests BOTTLES DRAWN AEROBIC AND ANAEROBIC 5CC EACH  Final   Culture   Final    NO GROWTH 5 DAYS Performed at [facility]    Report Status 07/25/2016 FINAL  Final  Blood culture (routine x 2)     Status: None   Collection Time: 07/20/16 11:55 AM  Result Value Ref Range Status   Specimen Description BLOOD LEFT AC  Final   Special Requests BOTTLES DRAWN AEROBIC AND ANAEROBIC 5CC EACH  Final   Culture   Final    NO GROWTH 5 DAYS Performed at [facility]    Report Status 07/25/2016 FINAL  Final  MRSA PCR Screening     Status: None   Collection Time: 07/20/16  5:46  PM  Result Value Ref Range Status   MRSA by PCR NEGATIVE NEGATIVE Final    Comment:        The GeneXpert MRSA Assay (FDA approved for NASAL specimens only), is one component of a comprehensive MRSA colonization surveillance program. It is not intended to diagnose MRSA infection nor to guide or monitor treatment for MRSA infections.   Body fluid culture     Status: None   Collection Time: 07/23/16 12:20 PM  Result Value Ref Range Status   Specimen Description GALL BLADDER FLUID  Final   Special Requests Normal  Final   Gram Stain   Final    RARE WBC PRESENT, PREDOMINANTLY MONONUCLEAR FEW GRAM POSITIVE COCCI IN PAIRS AND CHAINS RARE GRAM VARIABLE ROD Performed at [facility]    Culture   Final    FEW ENTEROCOCCUS FAECIUM RARE STAPHYLOCOCCUS EPIDERMIDIS    Report Status 07/27/2016 FINAL  Final   Organism ID, Bacteria ENTEROCOCCUS FAECIUM  Final   Organism ID, Bacteria STAPHYLOCOCCUS EPIDERMIDIS  Final      Susceptibility   Enterococcus faecium - MIC*    AMPICILLIN >=32 RESISTANT Resistant     VANCOMYCIN <=0.5 SENSITIVE Sensitive     GENTAMICIN SYNERGY SENSITIVE Sensitive     * FEW ENTEROCOCCUS FAECIUM   Staphylococcus epidermidis - MIC*    CIPROFLOXACIN <=0.5 SENSITIVE Sensitive  ERYTHROMYCIN >=8 RESISTANT Resistant     GENTAMICIN <=0.5 SENSITIVE Sensitive     OXACILLIN >=4 RESISTANT Resistant     TETRACYCLINE <=1 SENSITIVE Sensitive     VANCOMYCIN 1 SENSITIVE Sensitive     TRIMETH/SULFA <=10 SENSITIVE Sensitive     CLINDAMYCIN <=0.25 SENSITIVE Sensitive     RIFAMPIN <=0.5 SENSITIVE Sensitive     Inducible Clindamycin NEGATIVE Sensitive     * RARE STAPHYLOCOCCUS EPIDERMIDIS     Labs: BNP (last 3 results)  Recent Labs  02/11/16 2033 07/20/16 1126  BNP 131.7* 567.2*   Basic Metabolic Panel:  Recent Labs Lab 07/22/16 0305 07/23/16 0351 07/24/16 0348 07/25/16 0449 07/27/16 0509  NA 140 141 142 142 141  K 4.1 3.7 3.2* 3.5 3.5  CL 109 107 108  109 105  CO2 '26 26 29 26 28  ' GLUCOSE 141* 123* 144* 129* 144*  BUN 29* 29* 25* 21* 15  CREATININE 1.48* 1.27* 0.94 0.93 0.91  CALCIUM 8.2* 8.5* 8.3* 8.4* 8.3*   Liver Function Tests:  Recent Labs Lab 07/21/16 0100  AST 17  ALT 13*  ALKPHOS 71  BILITOT 1.0  PROT 6.0*  ALBUMIN 2.8*   No results for input(s): LIPASE, AMYLASE in the last 168 hours. No results for input(s): AMMONIA in the last 168 hours. CBC:  Recent Labs Lab 07/23/16 0351  07/23/16 1334 07/24/16 0348 07/25/16 0449 07/26/16 0503 07/27/16 0509  WBC 16.4*  --   --  12.1* 10.3 10.9* 11.5*  HGB 12.0*  < > 11.8* 11.6* 12.0* 12.0* 11.8*  HCT 38.9*  < > 37.3* 36.5* 38.3* 38.6* 37.4*  MCV 90.0  --   --  88.2 88.2 89.1 88.2  PLT 293  --   --  333 366 392 380  < > = values in this interval not displayed. Cardiac Enzymes:  Recent Labs Lab 07/20/16 2039  TROPONINI <0.03   BNP: Invalid input(s): POCBNP CBG:  Recent Labs Lab 07/26/16 1241 07/26/16 1652 07/26/16 2115 07/27/16 0753 07/27/16 1154  GLUCAP 157* 138* 175* 149* 121*   D-Dimer No results for input(s): DDIMER in the last 72 hours. Hgb A1c No results for input(s): HGBA1C in the last 72 hours. Lipid Profile No results for input(s): CHOL, HDL, LDLCALC, TRIG, CHOLHDL, LDLDIRECT in the last 72 hours. Thyroid function studies No results for input(s): TSH, T4TOTAL, T3FREE, THYROIDAB in the last 72 hours.  Invalid input(s): FREET3 Anemia work up No results for input(s): VITAMINB12, FOLATE, FERRITIN, TIBC, IRON, RETICCTPCT in the last 72 hours. Urinalysis    Component Value Date/Time   COLORURINE AMBER (A) 07/20/2016 1151   APPEARANCEUR CLEAR 07/20/2016 1151   LABSPEC 1.026 07/20/2016 1151   PHURINE 5.5 07/20/2016 1151   GLUCOSEU 250 (A) 07/20/2016 1151   HGBUR NEGATIVE 07/20/2016 1151   BILIRUBINUR SMALL (A) 07/20/2016 1151   KETONESUR 15 (A) 07/20/2016 1151   PROTEINUR 30 (A) 07/20/2016 1151   UROBILINOGEN 1.0 11/22/2012 1633   NITRITE  NEGATIVE 07/20/2016 1151   LEUKOCYTESUR NEGATIVE 07/20/2016 1151   Sepsis Labs Invalid input(s): PROCALCITONIN,  WBC,  LACTICIDVEN Microbiology Recent Results (from the past 240 hour(s))  Blood culture (routine x 2)     Status: None   Collection Time: 07/20/16 11:50 AM  Result Value Ref Range Status   Specimen Description BLOOD LEFT HAND  Final   Special Requests BOTTLES DRAWN AEROBIC AND ANAEROBIC 5CC EACH  Final   Culture   Final    NO GROWTH 5 DAYS Performed at [facility]    Report Status 07/25/2016 FINAL  Final  Blood culture (routine x 2)     Status: None   Collection Time: 07/20/16 11:55 AM  Result Value Ref Range Status   Specimen Description BLOOD LEFT AC  Final   Special Requests BOTTLES DRAWN AEROBIC AND ANAEROBIC 5CC EACH  Final   Culture   Final    NO GROWTH 5 DAYS Performed at [facility]    Report Status 07/25/2016 FINAL  Final  MRSA PCR Screening     Status: None   Collection Time: 07/20/16  5:46 PM  Result Value Ref Range Status   MRSA by PCR NEGATIVE NEGATIVE Final    Comment:        The GeneXpert MRSA Assay (FDA approved for NASAL specimens only), is one component of a comprehensive MRSA colonization surveillance program. It is not intended to diagnose MRSA infection nor to guide or monitor treatment for MRSA infections.   Body fluid culture     Status: None   Collection Time: 07/23/16 12:20 PM  Result Value Ref Range Status   Specimen Description GALL BLADDER FLUID  Final   Special Requests Normal  Final   Gram Stain   Final    RARE WBC PRESENT, PREDOMINANTLY MONONUCLEAR FEW GRAM POSITIVE COCCI IN PAIRS AND CHAINS RARE GRAM VARIABLE ROD Performed at [facility]    Culture   Final    FEW ENTEROCOCCUS FAECIUM RARE STAPHYLOCOCCUS EPIDERMIDIS    Report Status 07/27/2016 FINAL  Final   Organism ID, Bacteria ENTEROCOCCUS FAECIUM  Final   Organism ID, Bacteria STAPHYLOCOCCUS EPIDERMIDIS  Final      Susceptibility    Enterococcus faecium - MIC*    AMPICILLIN >=32 RESISTANT Resistant     VANCOMYCIN <=0.5 SENSITIVE Sensitive     GENTAMICIN SYNERGY SENSITIVE Sensitive     * FEW ENTEROCOCCUS FAECIUM   Staphylococcus epidermidis - MIC*    CIPROFLOXACIN <=0.5 SENSITIVE Sensitive     ERYTHROMYCIN >=8 RESISTANT Resistant     GENTAMICIN <=0.5 SENSITIVE Sensitive     OXACILLIN >=4 RESISTANT Resistant     TETRACYCLINE <=1 SENSITIVE Sensitive     VANCOMYCIN 1 SENSITIVE Sensitive     TRIMETH/SULFA <=10 SENSITIVE Sensitive     CLINDAMYCIN <=0.25 SENSITIVE Sensitive     RIFAMPIN <=0.5 SENSITIVE Sensitive     Inducible Clindamycin NEGATIVE Sensitive     * RARE STAPHYLOCOCCUS EPIDERMIDIS     Time coordinating discharge: Over 30 minutes  SIGNED:   Louellen Molder, MD  [facility] 07/27/2016, 2:40 PM Pager   If 7PM-7AM, please contact night-coverage [email or web address] Password [email or web address]

## 2016-07-27 NOTE — Clinical Social Work Placement (Signed)
Patient is set to discharge to [facility] today. Patient & wife, [name] at bedside made aware. Discharge packet given to RN, Elzie Rings. [facility] called for transport to pickup at 5:30pm.     Raynaldo Opitz, [place] Worker cell #: [phone]    CLINICAL SOCIAL WORK PLACEMENT  NOTE  Date:  07/27/2016  Patient Details  Name: [name] MRN: [id number] Date of Birth: 1932-02-27  Clinical Social Work is seeking post-discharge placement for this patient at the [place] level of care (*CSW will initial, date and re-position this form in  chart as items are completed):  Yes   Patient/family provided with [place] Work Department's list of facilities offering this level of care within the geographic area requested by the patient (or if unable, by the patient's family).  Yes   Patient/family informed of their freedom to choose among providers that offer the needed level of care, that participate in Medicare, Medicaid or managed care program needed by the patient, have an available bed and are willing to accept the patient.  Yes   Patient/family informed of [place]'s ownership interest in [facility] and [facility] as well as of the fact that they are under no obligation to receive care at these facilities.  PASRR submitted to EDS on       PASRR number received on       Existing PASRR number confirmed on 07/27/16     FL2 transmitted to all facilities in geographic area requested by pt/family on 07/27/16     FL2 transmitted to all facilities within larger geographic area on       Patient informed that his/her managed care company has contracts with or will negotiate with certain facilities, including the following:        Yes   Patient/family informed of bed offers received.  Patient chooses bed at [facility]     Physician recommends and patient chooses bed at      Patient to be  transferred to [facility] on 07/27/16.  Patient to be transferred to facility by [facility]     Patient family notified on 07/27/16 of transfer.  Name of family member notified:  patient's wife at bedside     PHYSICIAN       Additional Comment:    _______________________________________________ Standley Brooking, LCSW 07/27/2016, 3:17 PM

## 2016-07-27 NOTE — NC FL2 (Signed)
[place] LEVEL OF CARE SCREENING TOOL     IDENTIFICATION  Patient Name: [name] Birthdate: 05-11-1932 Sex: male Admission Date (Current Location): 07/20/2016  [facility] and [place] Number:  Herbalist and Address:  [facility],  [place] [address], [place]      Provider Number: [id number]  Attending Physician Name and Address:  Louellen Molder, MD  Relative Name and Phone Number:       Current Level of Care: Hospital Recommended Level of Care: [place] Prior Approval Number:    Date Approved/Denied:   PASRR Number: [id number]  Discharge Plan: SNF    Current Diagnoses: Patient Active Problem List   Diagnosis Date Noted  . Emphysematous cholecystitis s/p perc cholecystostomy drain 07/21/2016 07/20/2016  . Pressure injury of skin 07/20/2016  . Acute pulmonary embolism ([place]) 07/20/2016  . Hyponatremia 07/20/2016  . Fracture, ankle 06/04/2016  . Ankle fracture 06/04/2016  . Urinary retention   . Type 2 diabetes with nephropathy ([place])   . Pneumobilia 02/13/2016  . Acute pyelonephritis: Probable 02/13/2016  . UTI (lower urinary tract infection) 02/11/2016  . CKD stage 3 due to type 2 diabetes mellitus ([place]) 02/11/2016  . OSA on CPAP 11/30/2015  . Chronic post-traumatic stress disorder (PTSD) 11/30/2015  . Chronic pain syndrome 11/30/2015  . Morbid obesity due to excess calories ([place]) 11/30/2015  . Nocturia more than twice per night 11/30/2015  . Closed displaced bimalleolar fracture of right ankle 02/20/2015  . Bimalleolar ankle fracture 02/20/2015  . Duodenal ulcer, perforated s/p repair/omental patch 11/26/2012  . Helicobacter pylori gastritis 11/26/2012  . Lumbosacral radiculopathy at L5 11/12/2012  . Herniated lumbar intervertebral disc 11/12/2012  . Hyperglycemia 11/12/2012  . Essential hypertension, benign 11/12/2012  . Unspecified constipation 11/12/2012  . History of back surgery 11/09/2012   . Obesity 11/09/2012    Orientation RESPIRATION BLADDER Height & Weight     Self, Time, Situation, Place  Normal Continent Weight: 287 lb 14.7 oz (130.6 kg) Height:  _0  (180.3 cm)  BEHAVIORAL SYMPTOMS/MOOD NEUROLOGICAL BOWEL NUTRITION STATUS      Continent Diet (Regular)  AMBULATORY STATUS COMMUNICATION OF NEEDS Skin   Extensive Assist Verbally PU Stage and Appropriate Care (Pressure Injury 07/20/16 Stage I -  Intact skin with non-blanchable redness of a localized area usually over a bony prominence. (sacrum))                       Personal Care Assistance Level of Assistance  Bathing, Dressing Bathing Assistance: Limited assistance   Dressing Assistance: Limited assistance     Functional Limitations Info             SPECIAL CARE FACTORS FREQUENCY  PT (By licensed PT), OT (By licensed OT)     PT Frequency: 5 OT Frequency: 5            Contractures      Additional Factors Info  Code Status, Allergies Code Status Info: NKDA Allergies Info: Fullcode           Current Medications (07/27/2016):  This is the current hospital active medication list Current Facility-Administered Medications  Medication Dose Route Frequency Provider Last Rate Last Dose  . acetaminophen (TYLENOL) tablet 650 mg  650 mg Oral Q6H PRN Edwin Dada, MD   650 mg at 07/25/16 6578   Or  . acetaminophen (TYLENOL) suppository 650 mg  650 mg Rectal Q6H PRN Edwin Dada, MD      .  albuterol (PROVENTIL) (2.5 MG/3ML) 0.083% nebulizer solution 2.5 mg  2.5 mg Nebulization Q6H PRN [name], MD   2.5 mg at 07/26/16 0318  . carvedilol (COREG) tablet 3.125 mg  3.125 mg Oral BID WC [name], MD   3.125 mg at 07/27/16 0819  . cholecalciferol (VITAMIN D) tablet 2,000 Units  2,000 Units Oral BID [name], MD   2,000 Units at 07/27/16 0912  . diphenhydrAMINE (BENADRYL) capsule 25 mg  25 mg Oral Once Gardiner Barefoot, NP      . docusate sodium (COLACE) capsule  100 mg  100 mg Oral BID Edwin Dada, MD   100 mg at 07/27/16 0912  . galantamine (RAZADYNE ER) 24 hr capsule 16 mg  16 mg Oral Q breakfast [name], MD   16 mg at 07/27/16 0800  . hydrALAZINE (APRESOLINE) injection 10 mg  10 mg Intravenous Q6H PRN Ripudeep K Rai, MD      . HYDROmorphone (DILAUDID) injection 1-1.5 mg  1-1.5 mg Intravenous Q4H PRN Edwin Dada, MD   0.5 mg at 07/27/16 0532  . Influenza vac split quadrivalent PF (FLUARIX) injection 0.5 mL  0.5 mL Intramuscular Tomorrow-1000 Edwin Dada, MD      . insulin aspart (novoLOG) injection 0-15 Units  0-15 Units Subcutaneous TID WC Gardiner Barefoot, NP   2 Units at 07/27/16 [phone]  . insulin detemir (LEVEMIR) injection 20 Units  20 Units Subcutaneous QHS Edwin Dada, MD   20 Units at 07/26/16 2116  . iopamidol (ISOVUE-300) 61 % injection 10 mL  10 mL Intravenous Once PRN Greggory Keen, MD      . isosorbide mononitrate (IMDUR) 24 hr tablet 30 mg  30 mg Oral Daily [name], MD   30 mg at 07/27/16 0912  . LORazepam (ATIVAN) injection 2-3 mg  2-3 mg Intravenous Q1H PRN [name], MD   3 mg at 07/23/16 2118  . MEDLINE mouth rinse  15 mL Mouth Rinse BID Edwin Dada, MD   15 mL at 07/27/16 1000  . methocarbamol (ROBAXIN) tablet 750 mg  750 mg Oral TID Edwin Dada, MD   750 mg at 07/27/16 0912  . mineral oil enema 1 enema  1 enema Rectal Once Brooke A Miller, PA-C      . ondansetron (ZOFRAN) tablet 4 mg  4 mg Oral Q6H PRN Edwin Dada, MD       Or  . ondansetron (ZOFRAN) injection 4 mg  4 mg Intravenous Q6H PRN Edwin Dada, MD      . pantoprazole (PROTONIX) EC tablet 40 mg  40 mg Oral Daily Edwin Dada, MD   40 mg at 07/27/16 0912  . piperacillin-tazobactam (ZOSYN) IVPB 3.375 g  3.375 g Intravenous Q8H Edwin Dada, MD   3.375 g at 07/27/16 0819  . polyethylene glycol (MIRALAX / GLYCOLAX) packet 17 g  17 g Oral Daily Jerrye Beavers, PA-C    17 g at 07/26/16 1149  . protein supplement (PREMIER PROTEIN) liquid  11 oz Oral Daily [name], MD   11 oz at 07/26/16 1000  . rivaroxaban [facility]) Education Kit for DVT/PE patients   Does not apply Once [name], MD      . Rivaroxaban [facility]) tablet 15 mg  15 mg Oral BID WC [name], MD   15 mg at 07/27/16 0819  . [START ON 08/15/2016] rivaroxaban (XARELTO) tablet 20 mg  20 mg Oral Q supper [name], MD      . simethicone [facility]) chewable tablet 80 mg  80 mg Oral Q8H PRN [name], MD   80 mg at 07/26/16 0314  . simvastatin (ZOCOR) tablet 40 mg  40 mg Oral QPM [name], MD   40 mg at 07/26/16 1830  . sodium chloride flush (NS) 0.9 % injection 3 mL  3 mL Intravenous Q12H Edwin Dada, MD   3 mL at 07/25/16 1000  . tamsulosin (FLOMAX) capsule 0.4 mg  0.4 mg Oral QPC supper Edwin Dada, MD   0.4 mg at 07/26/16 1830     Discharge Medications: Please see discharge summary for a list of discharge medications.  Relevant Imaging Results:  Relevant Lab Results:   Additional Information SSN: [id number]  Standley Brooking, LCSW

## 2016-07-27 NOTE — Progress Notes (Signed)
Patient ID: [name], male   DOB: [birth year], 82 y.o.   MRN: [id number]  [facility] Surgery Progress Note     Subjective: Patient had small BM yesterday. XR (taken after BM) showed moderate stool in colon.  Complaining of global, aching abdominal pain today but he is not tender to palpation. Denies n/v. Tolerating diet.  Objective: Vital signs in last 24 hours: Temp:  [98.8 F (37.1 C)-99.1 F (37.3 C)] 98.8 F (37.1 C) (11/29 0348) Pulse Rate:  [85] 85 (11/29 0348) Resp:  [16-20] 20 (11/29 0348) BP: (124-169)/(65-90) 146/65 (11/29 0348) SpO2:  [93 %-97 %] 94 % (11/29 0348) Last BM Date: 07/24/16  Intake/Output from previous day: 11/28 0701 - 11/29 0700 In: 64 [P.O.:720; IV Piggyback:100] Out: 360 [Urine:200; Drains:160] Intake/Output this shift: No intake/output data recorded.  PE: Gen:  Alert, NAD, pleasant Pulm:  few scattered rhonchi, effort normal, no wheezes Abd: Soft, +BS, mildly distended, nontender, drain site clean, drain with bilious fluid  Lab Results:   Recent Labs  07/26/16 0503 07/27/16 0509  WBC 10.9* 11.5*  HGB 12.0* 11.8*  HCT 38.6* 37.4*  PLT 392 380   BMET  Recent Labs  07/25/16 0449 07/27/16 0509  NA 142 141  K 3.5 3.5  CL 109 105  CO2 26 28  GLUCOSE 129* 144*  BUN 21* 15  CREATININE 0.93 0.91  CALCIUM 8.4* 8.3*   PT/INR No results for input(s): LABPROT, INR in the last 72 hours. CMP     Component Value Date/Time   NA 141 07/27/2016 0509   NA 138 02/23/2016   K 3.5 07/27/2016 0509   CL 105 07/27/2016 0509   CO2 28 07/27/2016 0509   GLUCOSE 144 (H) 07/27/2016 0509   BUN 15 07/27/2016 0509   BUN 27 (A) 02/23/2016   CREATININE 0.91 07/27/2016 0509   CALCIUM 8.3 (L) 07/27/2016 0509   PROT 6.0 (L) 07/21/2016 0100   ALBUMIN 2.8 (L) 07/21/2016 0100   AST 17 07/21/2016 0100   ALT 13 (L) 07/21/2016 0100   ALKPHOS 71 07/21/2016 0100   BILITOT 1.0 07/21/2016 0100   GFRNONAA >60 07/27/2016 0509   GFRAA >60  07/27/2016 0509   Lipase  No results found for: LIPASE     Studies/Results: Dg Ankle Right Port  Result Date: 07/25/2016 CLINICAL DATA:  Ankle fracture 1 month ago.  Follow-up. EXAM: PORTABLE RIGHT ANKLE - 2 VIEW COMPARISON:  06/04/2016.  CT 06/06/2016. FINDINGS: Two screws are noted in the posterior calcaneus. Plate and screw fixation device in the distal fibula. No hardware complicating feature. Slightly displaced medial malleolar fracture again noted with fracture line remaining evident. IMPRESSION: Internal fixation of the distal fibular fracture and calcaneal fracture posteriorly. Slightly displaced medial malleolar fracture again noted with fracture line remaining evident. Electronically Signed   By: Rolm Baptise M.D.   On: 07/25/2016 12:54   Dg Abd Portable 1v  Result Date: 07/26/2016 CLINICAL DATA:  Abdominal distention for 3 days EXAM: PORTABLE ABDOMEN - 1 VIEW COMPARISON:  CT abdomen and pelvis July 20, 2016 FINDINGS: There is moderate stool in the colon. There is no bowel dilatation or air-fluid level suggesting bowel obstruction. No free air. Drain noted in right abdomen. Elevation of right hemidiaphragm present. IMPRESSION: Moderate stool in colon. No bowel obstruction or free air evident. Drain in right abdomen present. Electronically Signed   By: Lowella Grip III M.D.   On: 07/26/2016 14:49    Anti-infectives: Anti-infectives    Start  Dose/Rate Route Frequency Ordered Stop   07/21/16 0000  piperacillin-tazobactam (ZOSYN) IVPB 3.375 g     3.375 g 12.5 mL/hr over 240 Minutes Intravenous Every 8 hours 07/20/16 2021     07/20/16 1545  piperacillin-tazobactam (ZOSYN) IVPB 3.375 g     3.375 g 100 mL/hr over 30 Minutes Intravenous  Once 07/20/16 1535 07/20/16 1702   07/20/16 1311  vancomycin (VANCOCIN) 1-5 GM/200ML-% IVPB    Comments:  Woodward Ku   : cabinet override      07/20/16 1311 07/21/16 0114   07/20/16 1245  vancomycin (VANCOCIN) IVPB 1000 mg/200 mL  premix     1,000 mg 200 mL/hr over 60 Minutes Intravenous Every 1 hr x 2 07/20/16 1234 07/20/16 1444   07/20/16 1232  ceFEPIme (MAXIPIME) 1 g injection    Comments:  Eulogio Ditch   : cabinet override      07/20/16 1232 07/21/16 0044   07/20/16 1230  vancomycin (VANCOCIN) 1,769 mg in sodium chloride 0.9 % 500 mL IVPB  Status:  Discontinued     15 mg/kg  117.9 kg 250 mL/hr over 120 Minutes Intravenous  Once 07/20/16 1228 07/20/16 1234   07/20/16 1230  ceFEPIme (MAXIPIME) 1 g in dextrose 5 % 50 mL IVPB     1 g 100 mL/hr over 30 Minutes Intravenous  Once 07/20/16 1228 07/20/16 1305       Assessment/Plan Severe cholecystitis s/p cholecystostomy tube 07/21/16 by IR (AT) - culture: FEW ENTEROCOCCUS FAECIUM  Enterococcus faecium    MIC    AMPICILLIN >=32 RESIST... Resistant    GENTAMICIN SYNERGY SENSITIVE  Sensitive    VANCOMYCIN <=0.5 SENSITIVE "><=0.5 SENSI... Sensitive   - output 160cc/24 hr  Sepsis - improved ARF with RUL PNA Morbid obesity (Body mass index is 40) DM2 OSA Chronic back pain - dilaudid at home Right ankle fx with cast 06/08/16 - [facility] in [facility] PTSD/[place] Acute on CKD - Cr normalized Encephalopathy - improving Acute PE - heparin now switched to xarelto  VTE - xarelto FEN: Cardiac diet PP:IRJJO 07/20/16 =>> day 8  Plan: Complaining of abdominal pain today, nontender on exam and drain working well; may be due to constipation; will order enema.  WBC slightly up today 11.5 from 10.9, TMAX 99.1 over night. Continue antibiotics per primary team. Continue cholecystostomy drain, will stay in x6-8 weeks with drain clinic follow-up, and follow-up with Dr. Marcello Moores after that. Continue to flush catheter at home with 5-10cc of NS daily.    LOS: 7 days    Jerrye Beavers , [facility] Surgery 07/27/2016, 8:28 AM Pager: [phone] Consults: [phone] Mon-Fri 7:00 am-4:30 pm Sat-Sun 7:00 am-11:30 am

## 2016-07-27 NOTE — Progress Notes (Signed)
Report called to Judeen Hammans, Therapist, sports, at [facility]. All questions answered. Contact number provided. Pt and wife aware of transfer and agreeable.

## 2016-07-27 NOTE — Progress Notes (Signed)
Spoke with pt and wife concerning HH vs SNF. Pt selected [facility], if he can not go he selected [facility] at home for [facility].

## 2016-07-28 ENCOUNTER — Non-Acute Institutional Stay (SKILLED_NURSING_FACILITY): Payer: Medicare Other | Admitting: Adult Health

## 2016-07-28 ENCOUNTER — Encounter: Payer: Self-pay | Admitting: Adult Health

## 2016-07-28 DIAGNOSIS — Z8719 Personal history of other diseases of the digestive system: Secondary | ICD-10-CM

## 2016-07-28 DIAGNOSIS — I25810 Atherosclerosis of coronary artery bypass graft(s) without angina pectoris: Secondary | ICD-10-CM | POA: Diagnosis not present

## 2016-07-28 DIAGNOSIS — D72829 Elevated white blood cell count, unspecified: Secondary | ICD-10-CM

## 2016-07-28 DIAGNOSIS — I2699 Other pulmonary embolism without acute cor pulmonale: Secondary | ICD-10-CM

## 2016-07-28 DIAGNOSIS — K810 Acute cholecystitis: Secondary | ICD-10-CM | POA: Diagnosis not present

## 2016-07-28 DIAGNOSIS — N183 Chronic kidney disease, stage 3 unspecified: Secondary | ICD-10-CM

## 2016-07-28 DIAGNOSIS — G4733 Obstructive sleep apnea (adult) (pediatric): Secondary | ICD-10-CM | POA: Diagnosis not present

## 2016-07-28 DIAGNOSIS — E1122 Type 2 diabetes mellitus with diabetic chronic kidney disease: Secondary | ICD-10-CM

## 2016-07-28 DIAGNOSIS — R5381 Other malaise: Secondary | ICD-10-CM | POA: Diagnosis not present

## 2016-07-28 DIAGNOSIS — F0391 Unspecified dementia with behavioral disturbance: Secondary | ICD-10-CM

## 2016-07-28 DIAGNOSIS — I10 Essential (primary) hypertension: Secondary | ICD-10-CM | POA: Diagnosis not present

## 2016-07-28 DIAGNOSIS — E785 Hyperlipidemia, unspecified: Secondary | ICD-10-CM | POA: Diagnosis not present

## 2016-07-28 DIAGNOSIS — N400 Enlarged prostate without lower urinary tract symptoms: Secondary | ICD-10-CM | POA: Diagnosis not present

## 2016-07-28 DIAGNOSIS — S82891S Other fracture of right lower leg, sequela: Secondary | ICD-10-CM | POA: Diagnosis not present

## 2016-07-28 DIAGNOSIS — K5900 Constipation, unspecified: Secondary | ICD-10-CM

## 2016-07-28 DIAGNOSIS — Z9989 Dependence on other enabling machines and devices: Secondary | ICD-10-CM

## 2016-07-28 NOTE — Progress Notes (Signed)
DATE:  07/28/2016   MRN:  [id number]  BIRTHDAY: 11-19-1931  Facility:  [facility]ursing Home Location:  [place] Room Number: 705-P  LEVEL OF CARE:  SNF (31)  Contact Information    Name Relation Home Work [place] [phone]     Tresa Garter [phone]  [phone]   Allred,Beverley    [phone]       Code Status History    Date Active Date Inactive Code Status Order ID Comments User Context   07/20/2016  8:04 PM 07/27/2016  8:39 PM Full Code [id number]  Edwin Dada, MD Inpatient   06/04/2016 10:17 PM 06/04/2016 10:17 PM DNR [id number]  Toy Baker, MD Inpatient   06/04/2016 10:17 PM 06/08/2016  6:23 PM DNR [id number]  Toy Baker, MD Inpatient   02/11/2016 11:47 PM 02/15/2016  6:49 PM Full Code [id number]  Etta Quill, DO ED   02/19/2015  9:21 AM 02/22/2015  3:52 PM Full Code [id number]  Corky Sing, PA-C Inpatient   11/15/2012  9:16 PM 11/21/2012 11:20 PM Full Code [id number]  [name], MD Inpatient   11/09/2012  4:55 PM 11/15/2012  9:16 PM Full Code [id number]  Geradine Girt, DO Inpatient       Chief Complaint  Patient presents with  . Hospitalization Follow-up    HISTORY OF PRESENT ILLNESS:  This is an 82 year old male who has been admitted to [facility] on 07/27/16 from [facility] hospitalization 07/20/16 through 07/27/16. He has PMH of obstructive sleep apnea on CPAP, hypertension, chronic kidney disease stage III, insulin-dependent diabetes mellitus, post op stress disorder and chronic back pain. He was previously hospitalized from 06/04/16 to 06/08/16 for right ankle fracture and a cast was applied. He was discharged to SNF and followed up in the orthopedic clinic. Cast was reapplied on follow up and recommended nonweightbearing. He was being discharged from SNF and has to be brought to [facility] due to moderate intensity abdominal discomfort for. He  was found to be septic with fever, WBC 30 K, lactate 3.36 and mildly elevated troponin 0.26. Head CT, chest x-ray and UA was unremarkable. CT angiogram of the chest and abdomen showed a left small pulmonary embolism and emphysematous cholecystitis. He was placed on empiric vancomycin and Zosyn, given IV fluids and was started on heparin in deep then transferred to [facility]. Percutaneous cholecystostomy tube placed by IR on 07/21/16 and culture grew enteric feceum and staph epidermidis.  He was discharged on linezolid for 3 weeks duration with stop date 08/17/16 and Xarelto for at least 6 months, stop date 01/22/17.   He has been admitted for a short-term rehabilitation.  He was seen in his room with wife at bedside.   PAST MEDICAL HISTORY:  Past Medical History:  Diagnosis Date  . Arthritis   . Chronic back pain   . Constipation due to opioid therapy   . Diabetes mellitus without complication ([place])   . Full dentures   . Gastric perforation ([place])   . GERD (gastroesophageal reflux disease)   . Hypercholesteremia   . Hypertension    Pt is not aware that he is treated for HTN, found it listed in "Problem list"  . Pneumonia    1960's  . Post traumatic stress disorder (PTSD)    [place] War  . Sleep apnea    does not  use cpap  . Wears glasses   . Wears  hearing aid    both ears     CURRENT MEDICATIONS: Reviewed  Patient's Medications  New Prescriptions   No medications on file  Previous Medications   ALBUTEROL (PROVENTIL) (2.5 MG/3ML) 0.083% NEBULIZER SOLUTION    Take 3 mLs (2.5 mg total) by nebulization every 6 (six) hours as needed for wheezing or shortness of breath.   CARVEDILOL (COREG) 3.125 MG TABLET    Take 3.125 mg by mouth 2 (two) times daily with a meal.   CHLORPHENIRAMINE-HYDROCODONE (TUSSIONEX) 10-8 MG/5ML SUER    Take 5 mLs by mouth every 12 (twelve) hours.   CYANOCOBALAMIN 500 MCG TABLET    Take 500 mcg by mouth 2 (two) times daily.   DOCUSATE SODIUM  (COLACE) 100 MG CAPSULE    Take 1 capsule (100 mg total) by mouth 2 (two) times daily. While taking narcotic pain medicine.   ERGOCALCIFEROL (VITAMIN D2) 2000 UNITS TABS    Take 2,000 Units by mouth 2 (two) times daily.    GALANTAMINE (RAZADYNE ER) 16 MG 24 HR CAPSULE    Take 16 mg by mouth daily with breakfast.   HYPROMELLOSE (GENTEAL MILD TO MODERATE) 0.3 % SOLN    Apply 1 drop to eye 2 (two) times daily. Dry eyes   INSULIN ASPART (NOVOLOG) 100 UNIT/ML INJECTION    Inject 0-15 Units into the skin 3 (three) times daily with meals. Per sensitive sliding scale   INSULIN DETEMIR (LEVEMIR) 100 UNIT/ML INJECTION    Inject 0.3 mLs (30 Units total) into the skin at bedtime.   ISOSORBIDE MONONITRATE (IMDUR) 30 MG 24 HR TABLET    Take 30 mg by mouth every morning.    LINEZOLID (ZYVOX) 600 MG TABLET    Take 1 tablet (600 mg total) by mouth every 12 (twelve) hours.   MAGNESIUM OXIDE (MAG-OX) 400 MG TABLET    Take 400 mg by mouth 2 (two) times daily.   METHOCARBAMOL (ROBAXIN) 750 MG TABLET    Take 1 tablet (750 mg total) by mouth every 6 (six) hours as needed for muscle spasms.   MULTIPLE VITAMINS-MINERALS (PRESERVISION AREDS 2 PO)    Take 1 capsule by mouth 2 (two) times daily.   OXYCODONE-ACETAMINOPHEN (PERCOCET) 10-325 MG TABLET    Take 1 tablet by mouth every 4 (four) hours as needed for pain.   PANTOPRAZOLE (PROTONIX) 40 MG TABLET    Take 1 tablet (40 mg total) by mouth daily.   POLYETHYLENE GLYCOL (MIRALAX / GLYCOLAX) PACKET    Take 17 g by mouth daily.   PROTEIN SUPPLEMENT SHAKE (PREMIER PROTEIN) LIQD    Take 325 mLs (11 oz total) by mouth daily.   QUETIAPINE (SEROQUEL) 50 MG TABLET    Take 50 mg by mouth at bedtime.    RIVAROXABAN 15 & 20 MG TBPK    Take as directed on package: Start with one 15mg  tablet by mouth twice a day with food. On Day 22, switch to one 20mg  tablet once a day with food.   SIMVASTATIN (ZOCOR) 40 MG TABLET    Take 40 mg by mouth every evening.   TAMSULOSIN (FLOMAX) 0.4 MG CAPS  CAPSULE    Take 1 capsule (0.4 mg total) by mouth daily after supper.  Modified Medications   No medications on file  Discontinued Medications   No medications on file     No Known Allergies   REVIEW OF SYSTEMS:  GENERAL: no change in appetite, no fatigue, no weight changes, no fever, chills or weakness SKIN: Denies  rash, itching, wounds, ulcer sores, or nail abnormality EARS: Denies change in hearing, ringing in ears, or earache NOSE: Denies nasal congestion or epistaxis MOUTH and THROAT: Denies oral discomfort, gingival pain or bleeding, pain from teeth or hoarseness   RESPIRATORY: no cough, SOB, DOE, wheezing, hemoptysis CARDIAC: no chest pain, edema or palpitations GI: no abdominal pain, diarrhea, constipation, heart burn, nausea or vomiting GU: Denies dysuria, frequency, hematuria, incontinence, or discharge PSYCHIATRIC: Denies feeling of depression or anxiety. No report of hallucinations, insomnia, paranoia, or agitation   PHYSICAL EXAMINATION  GENERAL APPEARANCE: Well nourished. In no acute distress. Morbidly obese SKIN:  Skin is warm and dry.  HEAD: Normal in size and contour. No evidence of trauma EYES: Lids open and close normally. No blepharitis, entropion or ectropion. PERRL. Conjunctivae are clear and sclerae are white. Lenses are without opacity EARS: Pinnae are normal. Patient hears normal voice tunes of the examiner MOUTH and THROAT: Lips are without lesions. Oral mucosa is moist and without lesions. Tongue is normal in shape, size, and color and without lesions NECK: supple, trachea midline, no neck masses, no thyroid tenderness, no thyromegaly LYMPHATICS: no LAN in the neck, no supraclavicular LAN RESPIRATORY: breathing is even & unlabored, BS CTAB CARDIAC: RRR, no murmur,no extra heart sounds, no edema GI: abdomen soft, normal BS, no masses, no tenderness, no hepatomegaly, no splenomegaly;  has cholecystostomy drain on RUQ EXTREMITIES:  Able to move 4  extremities; has right leg short cast PSYCHIATRIC: Alert and oriented X 3. Affect and behavior are appropriate  LABS/RADIOLOGY: Labs reviewed: Basic Metabolic Panel:  Recent Labs  05/23/16 2047  06/05/16 0448  07/24/16 0348 07/25/16 0449 07/27/16 0509  NA 140  < > 140  < > 142 142 141  K 5.1  < > 4.6  < > 3.2* 3.5 3.5  CL 106  < > 105  < > 108 109 105  CO2 24  < > 29  < > 29 26 28   GLUCOSE 335*  < > 128*  < > 144* 129* 144*  BUN 34*  < > 27*  < > 25* 21* 15  CREATININE 1.40*  < > 1.22  < > 0.94 0.93 0.91  CALCIUM 9.0  < > 9.1  < > 8.3* 8.4* 8.3*  MG 2.0  --  1.8  --   --   --   --   PHOS  --   --  3.2  --   --   --   --   < > = values in this interval not displayed. Liver Function Tests:  Recent Labs  06/05/16 0448 07/20/16 1126 07/21/16 0100  AST 18 24 17   ALT 14* 15* 13*  ALKPHOS 84 94 71  BILITOT 0.5 0.7 1.0  PROT 6.8 7.4 6.0*  ALBUMIN 3.7 3.8 2.8*   CBC:  Recent Labs  02/14/16 0359  05/23/16 2047 06/04/16 1706  07/25/16 0449 07/26/16 0503 07/27/16 0509  WBC 11.9*  < > 14.5* 19.6*  < > 10.3 10.9* 11.5*  NEUTROABS 8.4*  --  10.7* 14.3*  --   --   --   --   HGB 11.2*  < > 13.5 13.6  < > 12.0* 12.0* 11.8*  HCT 34.9*  < > 42.5 41.3  < > 38.3* 38.6* 37.4*  MCV 89.9  < > 90.4 88.1  < > 88.2 89.1 88.2  PLT 226  < > 259 283  < > 366 392 380  < > = values in  this interval not displayed.  Lipid Panel:  Recent Labs  12/15/15  HDL 45   Cardiac Enzymes:  Recent Labs  07/20/16 1126 07/20/16 2039  TROPONINI 0.26* <0.03    CBG:  Recent Labs  07/26/16 2115 07/27/16 0753 07/27/16 1154  GLUCAP 175* 149* 121*      Dg Chest 2 View  Result Date: 07/20/2016 CLINICAL DATA:  Cough. EXAM: CHEST  2 VIEW COMPARISON:  06/05/2016. FINDINGS: Mediastinum and hilar structures are normal. Heart size normal. Low lung volumes with bibasilar atelectasis. Bibasilar pneumonia cannot be excluded. Small left pleural effusion. No pneumothorax. IMPRESSION: Low lung  volumes with bibasilar atelectasis. Bibasilar pneumonia cannot be excluded. Small left pleural effusion. Electronically Signed   By: Marcello Moores  Register   On: 07/20/2016 12:34   Ct Head Wo Contrast  Result Date: 07/20/2016 CLINICAL DATA:  Altered mental status today.  Initial encounter. EXAM: CT HEAD WITHOUT CONTRAST TECHNIQUE: Contiguous axial images were obtained from the base of the skull through the vertex without intravenous contrast. COMPARISON:  None. FINDINGS: Brain: There is some chronic microvascular ischemic change. No evidence of acute abnormality including infarct, hemorrhage, mass lesion, mass effect, midline shift or abnormal extra-axial fluid collection. No hydrocephalus or pneumocephalus. Vascular: Atherosclerosis noted. Skull: Intact. Sinuses/Orbits: Negative. Other: None. IMPRESSION: No acute abnormality. Electronically Signed   By: Inge Rise M.D.   On: 07/20/2016 14:53   Ct Angio Chest Pe W And/or Wo Contrast  Result Date: 07/20/2016 CLINICAL DATA:  Recent ankle fracture, hospitalized, cast on RIGHT leg, today not breathing RIGHT, altered mental status, abdominal pain since yesterday, history diabetes mellitus, hypertension, stage III chronic kidney disease EXAM: CT ANGIOGRAPHY CHEST CT ABDOMEN AND PELVIS WITH CONTRAST TECHNIQUE: Multidetector CT imaging of the chest was performed using the standard protocol during bolus administration of intravenous contrast. Multiplanar CT image reconstructions and MIPs were obtained to evaluate the vascular anatomy. Multidetector CT imaging of the abdomen and pelvis was performed using the standard protocol during bolus administration of intravenous contrast. CONTRAST:  80 mL of Isovue 370 IV.  No oral contrast. COMPARISON:  CTA chest 02/11/2016, CT abdomen pelvis 11/17/2012 FINDINGS: CTA CHEST FINDINGS Cardiovascular: Atherosclerotic calcifications aorta and coronary arteries. Aorta normal caliber without aneurysm or dissection. Dilated central  pulmonary arteries question pulmonary arterial hypertension. Single small filling defect identified in within the LEFT lower lobe pulmonary artery at its bifurcation compatible with a small age-indeterminate pulmonary embolus. No additional pulmonary emboli identified. No pericardial effusion. Mediastinum/Nodes: Base of cervical region unremarkable. No definite esophageal abnormalities. No thoracic adenopathy. Lungs/Pleura: Subsegmental atelectasis in BILATERAL lower lobes greater on RIGHT. No pulmonary infiltrate, pleural effusion, or pneumothorax. Musculoskeletal: Unremarkable Review of the MIP images confirms the above findings. CT ABDOMEN and PELVIS FINDINGS Hepatobiliary: Normal appearing liver. No biliary dilatation or biliary air. Thickened gallbladder wall with extensive pericholecystic infiltrative changes. Air within gallbladder lumen both at the fundus and at the neck/cystic duct extending to adjacent to the duodenal wall. Additional foci of gas are seen at the lower gallbladder segment, question intraluminal versus within wall. This could either represent emphysematous cholecystitis or prior erosion of a gallstone from the gallbladder neck into the duodenum. No gallstone identified within bowel nor evidence of gallstone ileus. Pancreas: Atrophic pancreas without mass Spleen: Normal appearance Adrenals/Urinary Tract: Tiny exophytic cyst at the inferomedial LEFT kidney 1.6 x 1.3 cm image 52. Adrenal glands, kidneys, and ureters otherwise normal appearance. Bladder decompressed. Unremarkable prostate gland. Stomach/Bowel: Mild edema adjacent to the wall of the second portion  of the duodenum extending to the proximal third duodenum with only minimal wall thickening; this is felt to be secondary to the adjacent gallbladder process rather than a primary duodenal process. Normal appendix. Scattered distal colonic diverticulosis. Stomach and bowel loops otherwise normal appearance. Vascular/Lymphatic:  Atherosclerotic calcifications aorta and iliac arteries, including at the proximal SMA and celiac artery as well as the renal artery origins. No significant adenopathy Reproductive: N/A Other: No free air or free fluid. Small BILATERAL inguinal hernias containing fat. Musculoskeletal: Osseous structures unremarkable. Review of the MIP images confirms the above findings. IMPRESSION: Filling defect at the bifurcation of the LEFT lower lobe pulmonary artery consistent with a small age-indeterminate pulmonary embolus. Subsegmental atelectasis in BILATERAL lower lobes. Distended gallbladder with thickened wall, significant pericholecystic infiltrative changes, air within the gallbladder, and question minimal gas within the gallbladder wall. These findings could represent emphysematous cholecystitis or could be related to prior erosion of a gallstone from the gallbladder neck to the duodenum. Aortic atherosclerosis and mild coronary arterial calcification. BILATERAL inguinal hernias containing fat. Findings called to Dr. Dayna Barker On 07/20/2016 at 1533 hours. Electronically Signed   By: Lavonia Dana M.D.   On: 07/20/2016 15:34   Ct Abdomen Pelvis W Contrast  Result Date: 07/20/2016 CLINICAL DATA:  Recent ankle fracture, hospitalized, cast on RIGHT leg, today not breathing RIGHT, altered mental status, abdominal pain since yesterday, history diabetes mellitus, hypertension, stage III chronic kidney disease EXAM: CT ANGIOGRAPHY CHEST CT ABDOMEN AND PELVIS WITH CONTRAST TECHNIQUE: Multidetector CT imaging of the chest was performed using the standard protocol during bolus administration of intravenous contrast. Multiplanar CT image reconstructions and MIPs were obtained to evaluate the vascular anatomy. Multidetector CT imaging of the abdomen and pelvis was performed using the standard protocol during bolus administration of intravenous contrast. CONTRAST:  80 mL of Isovue 370 IV.  No oral contrast. COMPARISON:  CTA chest  02/11/2016, CT abdomen pelvis 11/17/2012 FINDINGS: CTA CHEST FINDINGS Cardiovascular: Atherosclerotic calcifications aorta and coronary arteries. Aorta normal caliber without aneurysm or dissection. Dilated central pulmonary arteries question pulmonary arterial hypertension. Single small filling defect identified in within the LEFT lower lobe pulmonary artery at its bifurcation compatible with a small age-indeterminate pulmonary embolus. No additional pulmonary emboli identified. No pericardial effusion. Mediastinum/Nodes: Base of cervical region unremarkable. No definite esophageal abnormalities. No thoracic adenopathy. Lungs/Pleura: Subsegmental atelectasis in BILATERAL lower lobes greater on RIGHT. No pulmonary infiltrate, pleural effusion, or pneumothorax. Musculoskeletal: Unremarkable Review of the MIP images confirms the above findings. CT ABDOMEN and PELVIS FINDINGS Hepatobiliary: Normal appearing liver. No biliary dilatation or biliary air. Thickened gallbladder wall with extensive pericholecystic infiltrative changes. Air within gallbladder lumen both at the fundus and at the neck/cystic duct extending to adjacent to the duodenal wall. Additional foci of gas are seen at the lower gallbladder segment, question intraluminal versus within wall. This could either represent emphysematous cholecystitis or prior erosion of a gallstone from the gallbladder neck into the duodenum. No gallstone identified within bowel nor evidence of gallstone ileus. Pancreas: Atrophic pancreas without mass Spleen: Normal appearance Adrenals/Urinary Tract: Tiny exophytic cyst at the inferomedial LEFT kidney 1.6 x 1.3 cm image 52. Adrenal glands, kidneys, and ureters otherwise normal appearance. Bladder decompressed. Unremarkable prostate gland. Stomach/Bowel: Mild edema adjacent to the wall of the second portion of the duodenum extending to the proximal third duodenum with only minimal wall thickening; this is felt to be secondary to  the adjacent gallbladder process rather than a primary duodenal process. Normal appendix. Scattered distal  colonic diverticulosis. Stomach and bowel loops otherwise normal appearance. Vascular/Lymphatic: Atherosclerotic calcifications aorta and iliac arteries, including at the proximal SMA and celiac artery as well as the renal artery origins. No significant adenopathy Reproductive: N/A Other: No free air or free fluid. Small BILATERAL inguinal hernias containing fat. Musculoskeletal: Osseous structures unremarkable. Review of the MIP images confirms the above findings. IMPRESSION: Filling defect at the bifurcation of the LEFT lower lobe pulmonary artery consistent with a small age-indeterminate pulmonary embolus. Subsegmental atelectasis in BILATERAL lower lobes. Distended gallbladder with thickened wall, significant pericholecystic infiltrative changes, air within the gallbladder, and question minimal gas within the gallbladder wall. These findings could represent emphysematous cholecystitis or could be related to prior erosion of a gallstone from the gallbladder neck to the duodenum. Aortic atherosclerosis and mild coronary arterial calcification. BILATERAL inguinal hernias containing fat. Findings called to Dr. Dayna Barker On 07/20/2016 at 1533 hours. Electronically Signed   By: Lavonia Dana M.D.   On: 07/20/2016 15:34   Ir Perc Cholecystostomy  Result Date: 07/21/2016 INDICATION: ACUTE CHOLECYSTITIS EXAM: CHOLECYSTOSTOMY MEDICATIONS: ZOSYN 3.375 G; The antibiotic was administered within an appropriate time frame prior to the initiation of the procedure. ANESTHESIA/SEDATION: Moderate (conscious) sedation was employed during this procedure. A total of Versed 2.0 mg and Fentanyl 50 mcg was administered intravenously. Moderate Sedation Time: 15 minutes. The patient's level of consciousness and vital signs were monitored continuously by radiology nursing throughout the procedure under my direct supervision.  FLUOROSCOPY TIME:  Fluoroscopy Time:  36 seconds (36 mGy). COMPLICATIONS: None immediate. PROCEDURE: Informed written consent was obtained from the patient after a thorough discussion of the procedural risks, benefits and alternatives. All questions were addressed. Maximal Sterile Barrier Technique was utilized including caps, mask, sterile gowns, sterile gloves, sterile drape, hand hygiene and skin antiseptic. A timeout was performed prior to the initiation of the procedure. Previous imaging reviewed. Preliminary ultrasound performed. Gallbladder localized in the right upper quadrant in the mid axillary line through a lower intercostal space. Under sterile conditions and local anesthesia, ultrasound percutaneous transhepatic needle access performed of the distended gallbladder. Needle position confirmed with ultrasound. There was return of bile. Guidewire inserted followed by Accustick dilator set. Amplatz guidewire advanced into the gallbladder. Tract dilatation performed to insert a 10 [place] drain. Retention loop formed in the gallbladder. Position confirmed with fluoroscopy. Images obtained for documentation. Catheter secured with a Prolene suture and sterile dressing. Catheter connected to external gravity drainage bag. IMPRESSION: Successful ultrasound and fluoroscopic 10 French percutaneous cholecystostomy as above. Electronically Signed   By: Jerilynn Mages.  Shick M.D.   On: 07/21/2016 11:33   Dg Chest Port 1 View  Result Date: 07/22/2016 CLINICAL DATA:  Cough and wheezing EXAM: PORTABLE CHEST 1 VIEW COMPARISON:  Chest radiograph and chest CT July 20, 2016 FINDINGS: There is new patchy airspace consolidation in the right lower lobe. Lungs elsewhere are clear. Heart is borderline enlarged with pulmonary vascularity within normal limits. No adenopathy. There is degenerative change in each shoulder. There is atherosclerotic calcification in the aorta. IMPRESSION: Patchy airspace disease consistent with a degree  of pneumonia in the right upper lobe. Lungs elsewhere clear. Heart borderline enlarged. Aortic atherosclerosis. Electronically Signed   By: Lowella Grip III M.D.   On: 07/22/2016 10:49   Dg Ankle Right Port  Result Date: 07/25/2016 CLINICAL DATA:  Ankle fracture 1 month ago.  Follow-up. EXAM: PORTABLE RIGHT ANKLE - 2 VIEW COMPARISON:  06/04/2016.  CT 06/06/2016. FINDINGS: Two screws are noted in the posterior calcaneus.  Plate and screw fixation device in the distal fibula. No hardware complicating feature. Slightly displaced medial malleolar fracture again noted with fracture line remaining evident. IMPRESSION: Internal fixation of the distal fibular fracture and calcaneal fracture posteriorly. Slightly displaced medial malleolar fracture again noted with fracture line remaining evident. Electronically Signed   By: Rolm Baptise M.D.   On: 07/25/2016 12:54   Dg Abd Portable 1v  Result Date: 07/26/2016 CLINICAL DATA:  Abdominal distention for 3 days EXAM: PORTABLE ABDOMEN - 1 VIEW COMPARISON:  CT abdomen and pelvis July 20, 2016 FINDINGS: There is moderate stool in the colon. There is no bowel dilatation or air-fluid level suggesting bowel obstruction. No free air. Drain noted in right abdomen. Elevation of right hemidiaphragm present. IMPRESSION: Moderate stool in colon. No bowel obstruction or free air evident. Drain in right abdomen present. Electronically Signed   By: Lowella Grip III M.D.   On: 07/26/2016 14:49    ASSESSMENT/PLAN:  Physical deconditioning - for rehabilitation, PT and OT, for therapeutic strengthening exercises; fall precautions  Emphysematous cholecystitis S/P percutaneous cholecystostomy drain - percutaneous cholecystostomy tube was placed by IR on 07/21/16 and culture grew enterococcus feceum and staph epidermidis. It was discussed with Dr. Drucilla Schmidt, ID, who recommended linezolid for 3 weeks duration and will need to monitor for  thrombocytopenia; follow-up with  interventional radiology in 6 weeks  Acute Left lower lobe pulmonary embolism -was started on Xarelto, 15 mg 1 tab by mouth twice a day till 08/14/16 then 20 mg 1 tab by mouth daily starting on 08/15/16, and to continue for 6 months, stop date 01/22/17  Right ankle fracture - has short cast ; follows up with [facility] orthopedics and recommended conservative management; RLE nonweightbearing; follow-up with [facility] orthopedics in 1 week; continue Percocet 10/325 mg 1 tab by mouth every 4 hours when necessary for pain; Robaxin 750 mg 1 tab by mouth every 6 hours when necessary for muscle spasm  Diabetes mellitus, type II with nephropathy and long-term use of insulin - continue Levemir 100 units/mL 30 units subcutaneous daily at bedtime and NovoLog sliding scale Lab Results  Component Value Date   HGBA1C 8.4 (H) 06/05/2016   BPH - continue Flomax 0.4 mg 1 capsule by mouth daily  OSA - continue CPAP @ HS  Dementia with psychosis- continue galantamine ER 16 mg 1 capsule by mouth daily, Quetiapine fumarate 50 mg 1 tab by mouth daily at bedtime  CAD - no chest pains; continue isosorbide mononitrate ER 30 mg 1 tab by mouth daily  Hyperlipidemia - continue simvastatin 40 mg 1 tab by mouth daily in the evening Lab Results  Component Value Date   CHOL 172 12/15/2015   HDL 45 12/15/2015   LDLCALC 111 12/15/2015   TRIG 129 12/15/2015   CHOLHDL 6.1 11/22/2012   History of duodenal ulcer - continue pantoprazole 40 mg 1 tab by mouth daily   Hypertension - continue carvedilol 3.125 mg 1 tab by mouth twice a day ; check BMP on 08/01/16  Constipation - continue MiraLAX 17 g by mouth daily and Colace 100 mg 1 capsule by mouth twice a day  Leukocytosis  - no fever; currently on Linezolid; re-check CBC on 08/01/16 Lab Results  Component Value Date   WBC 11.5 (H) 07/27/2016      Goals of care:  Short-term rehabilitation    Durenda Age - NP [place] [phone]

## 2016-07-29 ENCOUNTER — Non-Acute Institutional Stay (SKILLED_NURSING_FACILITY): Payer: Medicare Other | Admitting: Internal Medicine

## 2016-07-29 ENCOUNTER — Encounter: Payer: Self-pay | Admitting: Internal Medicine

## 2016-07-29 DIAGNOSIS — G4709 Other insomnia: Secondary | ICD-10-CM

## 2016-07-29 DIAGNOSIS — D72828 Other elevated white blood cell count: Secondary | ICD-10-CM | POA: Diagnosis not present

## 2016-07-29 DIAGNOSIS — E1122 Type 2 diabetes mellitus with diabetic chronic kidney disease: Secondary | ICD-10-CM | POA: Diagnosis not present

## 2016-07-29 DIAGNOSIS — S82891S Other fracture of right lower leg, sequela: Secondary | ICD-10-CM

## 2016-07-29 DIAGNOSIS — K265 Chronic or unspecified duodenal ulcer with perforation: Secondary | ICD-10-CM

## 2016-07-29 DIAGNOSIS — I10 Essential (primary) hypertension: Secondary | ICD-10-CM

## 2016-07-29 DIAGNOSIS — G4733 Obstructive sleep apnea (adult) (pediatric): Secondary | ICD-10-CM | POA: Diagnosis not present

## 2016-07-29 DIAGNOSIS — F0281 Dementia in other diseases classified elsewhere with behavioral disturbance: Secondary | ICD-10-CM

## 2016-07-29 DIAGNOSIS — R339 Retention of urine, unspecified: Secondary | ICD-10-CM

## 2016-07-29 DIAGNOSIS — K810 Acute cholecystitis: Secondary | ICD-10-CM | POA: Diagnosis not present

## 2016-07-29 DIAGNOSIS — K5901 Slow transit constipation: Secondary | ICD-10-CM | POA: Diagnosis not present

## 2016-07-29 DIAGNOSIS — G308 Other Alzheimer's disease: Secondary | ICD-10-CM

## 2016-07-29 DIAGNOSIS — I2699 Other pulmonary embolism without acute cor pulmonale: Secondary | ICD-10-CM

## 2016-07-29 DIAGNOSIS — E440 Moderate protein-calorie malnutrition: Secondary | ICD-10-CM

## 2016-07-29 DIAGNOSIS — R5381 Other malaise: Secondary | ICD-10-CM

## 2016-07-29 DIAGNOSIS — E1169 Type 2 diabetes mellitus with other specified complication: Secondary | ICD-10-CM

## 2016-07-29 DIAGNOSIS — N183 Chronic kidney disease, stage 3 (moderate): Secondary | ICD-10-CM

## 2016-07-29 DIAGNOSIS — Z9989 Dependence on other enabling machines and devices: Secondary | ICD-10-CM

## 2016-07-29 DIAGNOSIS — G309 Alzheimer's disease, unspecified: Secondary | ICD-10-CM

## 2016-07-29 DIAGNOSIS — E669 Obesity, unspecified: Secondary | ICD-10-CM

## 2016-07-29 NOTE — Progress Notes (Signed)
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 q12h on 08/17/16. Plan is for cholecystectomy after clearing of infection  Acute pulmonary embolism Breathing is stable, not on oxygen at present. Continue xarelto 15 mg bid until 08/14/16 and then 20 mg daily from 08/15/16.   Protein calorie malnutrition Continue medpass supplement, followed by RD team, monitor weight  Leukocytosis Being treated for bacterial infection, monitor wbc and temp curve  Constipation Currently on colace bid and miralax daily. Discontinue this. Start miralax bid and senna s 2 tab bid for now and monitor. Hydration encouraged  Right ankle fracture S/p cast to RLE and NWB to RLE. Currently on percocet 10-325 mg q4h prn pain. He does not want norco. Would like dilaudid, explained about constipation associated with dilaudid. Patient agrees for tramadol instead. Discontinue norco. Will provide tramadol 50 mg 1-2 tab q4h prn pain and get PMR consult. Continue robaxin 750 mg q6h prn  muscle spasm. Follow with orthopedics.   Insomnia Per patient was taking trazodone at home. On chart review, I do not see this in his home med list. Start melatonin 5 mg qhs and monitor  OSA Continue cpap at bedtime and monitor  Type 2 dm in obese a1c of 8.4. Continue levemir and SSI. Monitor cbg  Urinary retention Monitor, continue flomax. No urinary symptom at present  HTN Continue carvedilol and isosorbide mononitrate, statin. Monitor BP  Alzheimer's dementia with behavioral disturbance Monitor his  behavior, continue galantamine and seroquel for now. Supportive care to be provided. Fall precautions.   History of duodenal ulcer No bleed reported. He is on xarelto. Continue pantoprazole and monitor for bleed. Monitor cbc  ckd stage 3 Monitor bmp   Goals of care: short term rehabilitation   Labs/tests ordered: cbc, cmp  Family/ staff Communication: reviewed care plan with patient and nursing supervisor    Blanchie Serve, MD Internal Medicine [place], [place] [address] Cell Phone (Monday-Friday 8 am - 5 pm): [phone] On Call: [phone] and follow prompts after 5 pm and on weekends Office Phone: [phone] Office Fax: [phone]

## 2016-08-01 DIAGNOSIS — S82841D Displaced bimalleolar fracture of right lower leg, subsequent encounter for closed fracture with routine healing: Secondary | ICD-10-CM | POA: Diagnosis not present

## 2016-08-01 DIAGNOSIS — Z5189 Encounter for other specified aftercare: Secondary | ICD-10-CM | POA: Diagnosis not present

## 2016-08-01 DIAGNOSIS — R2681 Unsteadiness on feet: Secondary | ICD-10-CM | POA: Diagnosis not present

## 2016-08-01 DIAGNOSIS — M6281 Muscle weakness (generalized): Secondary | ICD-10-CM | POA: Diagnosis not present

## 2016-08-01 DIAGNOSIS — M25562 Pain in left knee: Secondary | ICD-10-CM | POA: Diagnosis not present

## 2016-08-01 DIAGNOSIS — M25511 Pain in right shoulder: Secondary | ICD-10-CM | POA: Diagnosis not present

## 2016-08-02 ENCOUNTER — Other Ambulatory Visit: Payer: Self-pay | Admitting: General Surgery

## 2016-08-02 DIAGNOSIS — K810 Acute cholecystitis: Secondary | ICD-10-CM

## 2016-08-02 DIAGNOSIS — M25562 Pain in left knee: Secondary | ICD-10-CM | POA: Diagnosis not present

## 2016-08-02 DIAGNOSIS — R2681 Unsteadiness on feet: Secondary | ICD-10-CM | POA: Diagnosis not present

## 2016-08-02 DIAGNOSIS — Z5189 Encounter for other specified aftercare: Secondary | ICD-10-CM | POA: Diagnosis not present

## 2016-08-02 DIAGNOSIS — M25511 Pain in right shoulder: Secondary | ICD-10-CM | POA: Diagnosis not present

## 2016-08-02 DIAGNOSIS — M6281 Muscle weakness (generalized): Secondary | ICD-10-CM | POA: Diagnosis not present

## 2016-08-02 LAB — CBC AND DIFFERENTIAL
HEMATOCRIT: 45 % (ref 41–53)
Hemoglobin: 13.9 g/dL (ref 13.5–17.5)
NEUTROS ABS: 8 /uL
Platelets: 547 10*3/uL — AB (ref 150–399)
WBC: 11.8 10^3/mL

## 2016-08-02 LAB — BASIC METABOLIC PANEL
BUN: 12 mg/dL (ref 4–21)
Creatinine: 1 mg/dL (ref 0.6–1.3)
Glucose: 159 mg/dL
POTASSIUM: 5 mmol/L (ref 3.4–5.3)
SODIUM: 141 mmol/L (ref 137–147)

## 2016-08-03 ENCOUNTER — Other Ambulatory Visit: Payer: Self-pay | Admitting: *Deleted

## 2016-08-03 DIAGNOSIS — M25511 Pain in right shoulder: Secondary | ICD-10-CM | POA: Diagnosis not present

## 2016-08-03 DIAGNOSIS — R2681 Unsteadiness on feet: Secondary | ICD-10-CM | POA: Diagnosis not present

## 2016-08-03 DIAGNOSIS — M25562 Pain in left knee: Secondary | ICD-10-CM | POA: Diagnosis not present

## 2016-08-03 DIAGNOSIS — M6281 Muscle weakness (generalized): Secondary | ICD-10-CM | POA: Diagnosis not present

## 2016-08-03 DIAGNOSIS — Z5189 Encounter for other specified aftercare: Secondary | ICD-10-CM | POA: Diagnosis not present

## 2016-08-03 MED ORDER — HYDROMORPHONE HCL 2 MG PO TABS: ORAL_TABLET | ORAL | 0 refills | Status: DC

## 2016-08-03 NOTE — Telephone Encounter (Signed)
[facility] #[phone] Fax: [phone] 

## 2016-08-04 DIAGNOSIS — R2681 Unsteadiness on feet: Secondary | ICD-10-CM | POA: Diagnosis not present

## 2016-08-04 DIAGNOSIS — M25511 Pain in right shoulder: Secondary | ICD-10-CM | POA: Diagnosis not present

## 2016-08-04 DIAGNOSIS — M25562 Pain in left knee: Secondary | ICD-10-CM | POA: Diagnosis not present

## 2016-08-04 DIAGNOSIS — M6281 Muscle weakness (generalized): Secondary | ICD-10-CM | POA: Diagnosis not present

## 2016-08-04 DIAGNOSIS — Z5189 Encounter for other specified aftercare: Secondary | ICD-10-CM | POA: Diagnosis not present

## 2016-08-05 ENCOUNTER — Non-Acute Institutional Stay (SKILLED_NURSING_FACILITY): Payer: Medicare Other | Admitting: Adult Health

## 2016-08-05 ENCOUNTER — Encounter: Payer: Self-pay | Admitting: Adult Health

## 2016-08-05 DIAGNOSIS — G4733 Obstructive sleep apnea (adult) (pediatric): Secondary | ICD-10-CM | POA: Diagnosis not present

## 2016-08-05 DIAGNOSIS — E669 Obesity, unspecified: Secondary | ICD-10-CM

## 2016-08-05 DIAGNOSIS — I25810 Atherosclerosis of coronary artery bypass graft(s) without angina pectoris: Secondary | ICD-10-CM

## 2016-08-05 DIAGNOSIS — G308 Other Alzheimer's disease: Secondary | ICD-10-CM

## 2016-08-05 DIAGNOSIS — I2699 Other pulmonary embolism without acute cor pulmonale: Secondary | ICD-10-CM | POA: Diagnosis not present

## 2016-08-05 DIAGNOSIS — S82891S Other fracture of right lower leg, sequela: Secondary | ICD-10-CM

## 2016-08-05 DIAGNOSIS — D72829 Elevated white blood cell count, unspecified: Secondary | ICD-10-CM

## 2016-08-05 DIAGNOSIS — K265 Chronic or unspecified duodenal ulcer with perforation: Secondary | ICD-10-CM | POA: Diagnosis not present

## 2016-08-05 DIAGNOSIS — R5381 Other malaise: Secondary | ICD-10-CM | POA: Diagnosis not present

## 2016-08-05 DIAGNOSIS — N400 Enlarged prostate without lower urinary tract symptoms: Secondary | ICD-10-CM

## 2016-08-05 DIAGNOSIS — G309 Alzheimer's disease, unspecified: Secondary | ICD-10-CM

## 2016-08-05 DIAGNOSIS — Z9989 Dependence on other enabling machines and devices: Secondary | ICD-10-CM

## 2016-08-05 DIAGNOSIS — E785 Hyperlipidemia, unspecified: Secondary | ICD-10-CM

## 2016-08-05 DIAGNOSIS — K810 Acute cholecystitis: Secondary | ICD-10-CM | POA: Diagnosis not present

## 2016-08-05 DIAGNOSIS — I10 Essential (primary) hypertension: Secondary | ICD-10-CM

## 2016-08-05 DIAGNOSIS — E1169 Type 2 diabetes mellitus with other specified complication: Secondary | ICD-10-CM

## 2016-08-05 DIAGNOSIS — K5901 Slow transit constipation: Secondary | ICD-10-CM | POA: Diagnosis not present

## 2016-08-05 DIAGNOSIS — F0281 Dementia in other diseases classified elsewhere with behavioral disturbance: Secondary | ICD-10-CM

## 2016-08-05 NOTE — Progress Notes (Signed)
DATE:  08/05/2016   MRN:  [id number]  BIRTHDAY: 11-Jan-1932  Facility:  Nursing Home Location:  [place] Room Number: 705-P  LEVEL OF CARE:  SNF (31)  Contact Information    Name Relation Home Work [place] Spouse [phone]  [phone]   Tresa Garter [phone]  [phone]   Allred,Beverley    [phone]       Code Status History    Date Active Date Inactive Code Status Order ID Comments User Context   07/20/2016  8:04 PM 07/27/2016  8:39 PM Full Code [id number]  Edwin Dada, MD Inpatient   06/04/2016 10:17 PM 06/04/2016 10:17 PM DNR [id number]  Toy Baker, MD Inpatient   06/04/2016 10:17 PM 06/08/2016  6:23 PM DNR [id number]  Toy Baker, MD Inpatient   02/11/2016 11:47 PM 02/15/2016  6:49 PM Full Code [id number]  Etta Quill, DO ED   02/19/2015  9:21 AM 02/22/2015  3:52 PM Full Code [id number]  Corky Sing, PA-C Inpatient   11/15/2012  9:16 PM 11/21/2012 11:20 PM Full Code [id number]  [name], MD Inpatient   11/09/2012  4:55 PM 11/15/2012  9:16 PM Full Code [id number]  Geradine Girt, DO Inpatient       Chief Complaint  Patient presents with  . Discharge Note    HISTORY OF PRESENT ILLNESS:  The patient is an 73-YO male who is for discharge home with Home health PT, OT, sw, CNA and Nursing for wound care.  He has been admitted to [facility] on 07/27/16 from [facility] hospitalization 07/20/16 through 07/27/16. He has PMH of obstructive sleep apnea on CPAP, hypertension, chronic kidney disease stage III, insulin-dependent diabetes mellitus, post op stress disorder and chronic back pain. He was previously hospitalized from 06/04/16 to 06/08/16 for right ankle fracture and a cast was applied. He was discharged to SNF and followed up in the orthopedic clinic. Cast was reapplied on follow up and recommended nonweightbearing. He was being discharged from SNF and has to be  brought to [facility] due to moderate intensity abdominal discomfort. He was found to be septic with fever, WBC 30 K, lactate 3.36 and mildly elevated troponin 0.26. Head CT, chest x-ray and UA was unremarkable. CT angiogram of the chest and abdomen showed a left small pulmonary embolism and emphysematous cholecystitis. He was placed on empiric vancomycin and Zosyn, given IV fluids and was started on heparin in deep then transferred to [facility]. Percutaneous cholecystostomy tube placed by IR on 07/21/16 and culture grew enteric feceum and staph epidermidis.  He was discharged on linezolid for 3 weeks duration with stop date 08/17/16 and Xarelto for at least 6 months, stop date 01/22/17.   Patient was admitted to this facility for short-term rehabilitation after the patient's recent hospitalization.  Patient has completed SNF rehabilitation and therapy has cleared the patient for discharge.    PAST MEDICAL HISTORY:  Past Medical History:  Diagnosis Date  . Arthritis   . Chronic back pain   . Constipation due to opioid therapy   . Diabetes mellitus without complication ([place])   . Full dentures   . Gastric perforation ([place])   . GERD (gastroesophageal reflux disease)   . Hypercholesteremia   . Hypertension    Pt is not aware that he is treated for HTN, found it listed in "Problem list"  . Pneumonia    1960's  . Post traumatic  stress disorder (PTSD)    [place] War  . Sleep apnea    does not  use cpap  . Wears glasses   . Wears hearing aid    both ears     CURRENT MEDICATIONS: Reviewed  Patient's Medications  New Prescriptions   No medications on file  Previous Medications   ACETAMINOPHEN (TYLENOL) 500 MG TABLET    Take 500 mg by mouth every 8 (eight) hours as needed.   ALBUTEROL (PROVENTIL) (2.5 MG/3ML) 0.083% NEBULIZER SOLUTION    Take 3 mLs (2.5 mg total) by nebulization every 6 (six) hours as needed for wheezing or shortness of breath.   CARVEDILOL  (COREG) 3.125 MG TABLET    Take 3.125 mg by mouth 2 (two) times daily with a meal.   CHLORPHENIRAMINE-HYDROCODONE (TUSSIONEX PENNKINETIC ER) 10-8 MG/5ML SUER    Take 5 mLs by mouth every 12 (twelve) hours as needed for cough.   CHOLECALCIFEROL (VITAMIN D3) 2000 UNITS CAPSULE    Take 2,000 Units by mouth daily.   CYANOCOBALAMIN 500 MCG TABLET    Take 500 mcg by mouth 2 (two) times daily.   GALANTAMINE (RAZADYNE ER) 16 MG 24 HR CAPSULE    Take 16 mg by mouth daily with breakfast.   HYDROMORPHONE (DILAUDID) 2 MG TABLET    Take one tablet by mouth every morning for pain; Take one tablet by mouth every night at bedtime as needed for moderate to severe pain. Hold for sedation   HYPROMELLOSE (GENTEAL MILD TO MODERATE) 0.3 % SOLN    Apply 1 drop to eye 2 (two) times daily. Dry eyes   INSULIN DETEMIR (LEVEMIR) 100 UNIT/ML INJECTION    Inject 0.3 mLs (30 Units total) into the skin at bedtime.   INSULIN LISPRO (HUMALOG) 100 UNIT/ML INJECTION    Inject 0-5 Units into the skin 3 (three) times daily before meals. 0-200 = 0 units, 201-250 = 2 units, 251-300 = 3 units, 301-350 = 4 units, 351-400 = 5 units, >401 call MD/NP   ISOSORBIDE MONONITRATE (IMDUR) 30 MG 24 HR TABLET    Take 30 mg by mouth every morning.    LINEZOLID (ZYVOX) 600 MG TABLET    Take 1 tablet (600 mg total) by mouth every 12 (twelve) hours.   MAGNESIUM OXIDE (MAG-OX) 400 MG TABLET    Take 400 mg by mouth 2 (two) times daily.   MELATONIN 5 MG TABS    Take 5 mg by mouth at bedtime.   MENTHOL (ICY HOT) 5 % PTCH    Apply 1-2 patches topically daily. Apply 2 patches to right shoulder QAM, 1 patch to left knee   METHOCARBAMOL (ROBAXIN) 750 MG TABLET    Take 1 tablet (750 mg total) by mouth every 6 (six) hours as needed for muscle spasms.   MULTIPLE VITAMINS-MINERALS (PRESERVISION AREDS 2 PO)    Take 1 capsule by mouth 2 (two) times daily.   NUTRITIONAL SUPPLEMENT LIQD    Take 120 mLs by mouth 2 (two) times daily.   PANTOPRAZOLE (PROTONIX) 40 MG TABLET     Take 1 tablet (40 mg total) by mouth daily.   POLYETHYLENE GLYCOL (MIRALAX / GLYCOLAX) PACKET    Take 17 g by mouth daily.   QUETIAPINE (SEROQUEL) 50 MG TABLET    Take 50 mg by mouth at bedtime.    RIVAROXABAN 15 & 20 MG TBPK    Take as directed on package: Start with one 15mg  tablet by mouth twice a day with food. On Day  22, switch to one 20mg  tablet once a day with food.   SENNOSIDES-DOCUSATE SODIUM (SENOKOT-S) 8.6-50 MG TABLET    Take 2 tablets by mouth 2 (two) times daily.   SIMVASTATIN (ZOCOR) 40 MG TABLET    Take 40 mg by mouth every evening.   TAMSULOSIN (FLOMAX) 0.4 MG CAPS CAPSULE    Take 1 capsule (0.4 mg total) by mouth daily after supper.  Modified Medications   No medications on file  Discontinued Medications   CHLORPHENIRAMINE-HYDROCODONE (TUSSIONEX) 10-8 MG/5ML SUER    Take 5 mLs by mouth every 12 (twelve) hours.   DOCUSATE SODIUM (COLACE) 100 MG CAPSULE    Take 1 capsule (100 mg total) by mouth 2 (two) times daily. While taking narcotic pain medicine.   ERGOCALCIFEROL (VITAMIN D2) 2000 UNITS TABS    Take 2,000 Units by mouth daily.    INSULIN ASPART (NOVOLOG) 100 UNIT/ML INJECTION    Inject 0-5 Units into the skin 3 (three) times daily before meals.   OXYCODONE-ACETAMINOPHEN (PERCOCET) 10-325 MG TABLET    Take 1 tablet by mouth every 4 (four) hours as needed for pain.   PROTEIN SUPPLEMENT SHAKE (PREMIER PROTEIN) LIQD    Take 325 mLs (11 oz total) by mouth daily.     No Known Allergies   REVIEW OF SYSTEMS:  GENERAL: no change in appetite, no fatigue, no weight changes, no fever, chills or weakness EYES: Denies change in vision, dry eyes, eye pain, itching or discharge EARS: Denies change in hearing, ringing in ears, or earache NOSE: Denies nasal congestion or epistaxis MOUTH and THROAT: Denies oral discomfort, gingival pain or bleeding, pain from teeth or hoarseness   RESPIRATORY: no cough, SOB, DOE, wheezing, hemoptysis CARDIAC: no chest pain, edema or  palpitations GI: no abdominal pain, diarrhea, constipation, heart burn, nausea or vomiting GU: Denies dysuria, frequency, hematuria, incontinence, or discharge PSYCHIATRIC: Denies feeling of depression or anxiety. No report of hallucinations, insomnia, paranoia, or agitation   PHYSICAL EXAMINATION  GENERAL APPEARANCE: Well nourished. In no acute distress. Morbidly obese SKIN:  Skin is warm and dry.  HEAD: Normal in size and contour. No evidence of trauma EYES: Lids open and close normally. No blepharitis, entropion or ectropion. PERRL. Conjunctivae are clear and sclerae are white. Lenses are without opacity EARS: Pinnae are normal. Patient hears normal voice tunes of the examiner MOUTH and THROAT: Lips are without lesions. Oral mucosa is moist and without lesions. Tongue is normal in shape, size, and color and without lesions NECK: supple, trachea midline, no neck masses, no thyroid tenderness, no thyromegaly LYMPHATICS: no LAN in the neck, no supraclavicular LAN RESPIRATORY: breathing is even & unlabored, BS CTAB CARDIAC: RRR, no murmur,no extra heart sounds, no edema GI: abdomen soft, normal BS, no masses, no tenderness, no hepatomegaly, no splenomegaly EXTREMITIES:  Able to move X 4 extremities, right foot with cast, able to wiggle toes on right foot PSYCHIATRIC: Alert and oriented X 3. Affect and behavior are appropriate   LABS/RADIOLOGY: Labs reviewed: Basic Metabolic Panel:  Recent Labs  05/23/16 2047  06/05/16 0448  07/24/16 0348 07/25/16 0449 07/27/16 0509 08/02/16  NA 140  < > 140  < > 142 142 141 141  K 5.1  < > 4.6  < > 3.2* 3.5 3.5 5.0  CL 106  < > 105  < > 108 109 105  --   CO2 24  < > 29  < > 29 26 28   --   GLUCOSE 335*  < >  128*  < > 144* 129* 144*  --   BUN 34*  < > 27*  < > 25* 21* 15 12  CREATININE 1.40*  < > 1.22  < > 0.94 0.93 0.91 1.0  CALCIUM 9.0  < > 9.1  < > 8.3* 8.4* 8.3*  --   MG 2.0  --  1.8  --   --   --   --   --   PHOS  --   --  3.2  --   --    --   --   --   < > = values in this interval not displayed. Liver Function Tests:  Recent Labs  06/05/16 0448 07/20/16 1126 07/21/16 0100  AST 18 24 17   ALT 14* 15* 13*  ALKPHOS 84 94 71  BILITOT 0.5 0.7 1.0  PROT 6.8 7.4 6.0*  ALBUMIN 3.7 3.8 2.8*    CBC:  Recent Labs  05/23/16 2047 06/04/16 1706  07/25/16 0449 07/26/16 0503 07/27/16 0509 08/02/16  WBC 14.5* 19.6*  < > 10.3 10.9* 11.5* 11.8  NEUTROABS 10.7* 14.3*  --   --   --   --  8  HGB 13.5 13.6  < > 12.0* 12.0* 11.8* 13.9  HCT 42.5 41.3  < > 38.3* 38.6* 37.4* 45  MCV 90.4 88.1  < > 88.2 89.1 88.2  --   PLT 259 283  < > 366 392 380 547*  < > = values in this interval not displayed. Lipid Panel:  Recent Labs  12/15/15  HDL 45   Cardiac Enzymes:  Recent Labs  07/20/16 1126 07/20/16 2039  TROPONINI 0.26* <0.03    CBG:  Recent Labs  07/26/16 2115 07/27/16 0753 07/27/16 1154  GLUCAP 175* 149* 121*    Dg Chest 2 View  Result Date: 07/20/2016 CLINICAL DATA:  Cough. EXAM: CHEST  2 VIEW COMPARISON:  06/05/2016. FINDINGS: Mediastinum and hilar structures are normal. Heart size normal. Low lung volumes with bibasilar atelectasis. Bibasilar pneumonia cannot be excluded. Small left pleural effusion. No pneumothorax. IMPRESSION: Low lung volumes with bibasilar atelectasis. Bibasilar pneumonia cannot be excluded. Small left pleural effusion. Electronically Signed   By: Marcello Moores  Register   On: 07/20/2016 12:34   Ct Head Wo Contrast  Result Date: 07/20/2016 CLINICAL DATA:  Altered mental status today.  Initial encounter. EXAM: CT HEAD WITHOUT CONTRAST TECHNIQUE: Contiguous axial images were obtained from the base of the skull through the vertex without intravenous contrast. COMPARISON:  None. FINDINGS: Brain: There is some chronic microvascular ischemic change. No evidence of acute abnormality including infarct, hemorrhage, mass lesion, mass effect, midline shift or abnormal extra-axial fluid collection. No  hydrocephalus or pneumocephalus. Vascular: Atherosclerosis noted. Skull: Intact. Sinuses/Orbits: Negative. Other: None. IMPRESSION: No acute abnormality. Electronically Signed   By: Inge Rise M.D.   On: 07/20/2016 14:53   Ct Angio Chest Pe W And/or Wo Contrast  Result Date: 07/20/2016 CLINICAL DATA:  Recent ankle fracture, hospitalized, cast on RIGHT leg, today not breathing RIGHT, altered mental status, abdominal pain since yesterday, history diabetes mellitus, hypertension, stage III chronic kidney disease EXAM: CT ANGIOGRAPHY CHEST CT ABDOMEN AND PELVIS WITH CONTRAST TECHNIQUE: Multidetector CT imaging of the chest was performed using the standard protocol during bolus administration of intravenous contrast. Multiplanar CT image reconstructions and MIPs were obtained to evaluate the vascular anatomy. Multidetector CT imaging of the abdomen and pelvis was performed using the standard protocol during bolus administration of intravenous contrast. CONTRAST:  80 mL of  Isovue 370 IV.  No oral contrast. COMPARISON:  CTA chest 02/11/2016, CT abdomen pelvis 11/17/2012 FINDINGS: CTA CHEST FINDINGS Cardiovascular: Atherosclerotic calcifications aorta and coronary arteries. Aorta normal caliber without aneurysm or dissection. Dilated central pulmonary arteries question pulmonary arterial hypertension. Single small filling defect identified in within the LEFT lower lobe pulmonary artery at its bifurcation compatible with a small age-indeterminate pulmonary embolus. No additional pulmonary emboli identified. No pericardial effusion. Mediastinum/Nodes: Base of cervical region unremarkable. No definite esophageal abnormalities. No thoracic adenopathy. Lungs/Pleura: Subsegmental atelectasis in BILATERAL lower lobes greater on RIGHT. No pulmonary infiltrate, pleural effusion, or pneumothorax. Musculoskeletal: Unremarkable Review of the MIP images confirms the above findings. CT ABDOMEN and PELVIS FINDINGS  Hepatobiliary: Normal appearing liver. No biliary dilatation or biliary air. Thickened gallbladder wall with extensive pericholecystic infiltrative changes. Air within gallbladder lumen both at the fundus and at the neck/cystic duct extending to adjacent to the duodenal wall. Additional foci of gas are seen at the lower gallbladder segment, question intraluminal versus within wall. This could either represent emphysematous cholecystitis or prior erosion of a gallstone from the gallbladder neck into the duodenum. No gallstone identified within bowel nor evidence of gallstone ileus. Pancreas: Atrophic pancreas without mass Spleen: Normal appearance Adrenals/Urinary Tract: Tiny exophytic cyst at the inferomedial LEFT kidney 1.6 x 1.3 cm image 52. Adrenal glands, kidneys, and ureters otherwise normal appearance. Bladder decompressed. Unremarkable prostate gland. Stomach/Bowel: Mild edema adjacent to the wall of the second portion of the duodenum extending to the proximal third duodenum with only minimal wall thickening; this is felt to be secondary to the adjacent gallbladder process rather than a primary duodenal process. Normal appendix. Scattered distal colonic diverticulosis. Stomach and bowel loops otherwise normal appearance. Vascular/Lymphatic: Atherosclerotic calcifications aorta and iliac arteries, including at the proximal SMA and celiac artery as well as the renal artery origins. No significant adenopathy Reproductive: N/A Other: No free air or free fluid. Small BILATERAL inguinal hernias containing fat. Musculoskeletal: Osseous structures unremarkable. Review of the MIP images confirms the above findings. IMPRESSION: Filling defect at the bifurcation of the LEFT lower lobe pulmonary artery consistent with a small age-indeterminate pulmonary embolus. Subsegmental atelectasis in BILATERAL lower lobes. Distended gallbladder with thickened wall, significant pericholecystic infiltrative changes, air within the  gallbladder, and question minimal gas within the gallbladder wall. These findings could represent emphysematous cholecystitis or could be related to prior erosion of a gallstone from the gallbladder neck to the duodenum. Aortic atherosclerosis and mild coronary arterial calcification. BILATERAL inguinal hernias containing fat. Findings called to Dr. Dayna Barker On 07/20/2016 at 1533 hours. Electronically Signed   By: Lavonia Dana M.D.   On: 07/20/2016 15:34   Ct Abdomen Pelvis W Contrast  Result Date: 07/20/2016 CLINICAL DATA:  Recent ankle fracture, hospitalized, cast on RIGHT leg, today not breathing RIGHT, altered mental status, abdominal pain since yesterday, history diabetes mellitus, hypertension, stage III chronic kidney disease EXAM: CT ANGIOGRAPHY CHEST CT ABDOMEN AND PELVIS WITH CONTRAST TECHNIQUE: Multidetector CT imaging of the chest was performed using the standard protocol during bolus administration of intravenous contrast. Multiplanar CT image reconstructions and MIPs were obtained to evaluate the vascular anatomy. Multidetector CT imaging of the abdomen and pelvis was performed using the standard protocol during bolus administration of intravenous contrast. CONTRAST:  80 mL of Isovue 370 IV.  No oral contrast. COMPARISON:  CTA chest 02/11/2016, CT abdomen pelvis 11/17/2012 FINDINGS: CTA CHEST FINDINGS Cardiovascular: Atherosclerotic calcifications aorta and coronary arteries. Aorta normal caliber without aneurysm or dissection. Dilated central  pulmonary arteries question pulmonary arterial hypertension. Single small filling defect identified in within the LEFT lower lobe pulmonary artery at its bifurcation compatible with a small age-indeterminate pulmonary embolus. No additional pulmonary emboli identified. No pericardial effusion. Mediastinum/Nodes: Base of cervical region unremarkable. No definite esophageal abnormalities. No thoracic adenopathy. Lungs/Pleura: Subsegmental atelectasis in BILATERAL  lower lobes greater on RIGHT. No pulmonary infiltrate, pleural effusion, or pneumothorax. Musculoskeletal: Unremarkable Review of the MIP images confirms the above findings. CT ABDOMEN and PELVIS FINDINGS Hepatobiliary: Normal appearing liver. No biliary dilatation or biliary air. Thickened gallbladder wall with extensive pericholecystic infiltrative changes. Air within gallbladder lumen both at the fundus and at the neck/cystic duct extending to adjacent to the duodenal wall. Additional foci of gas are seen at the lower gallbladder segment, question intraluminal versus within wall. This could either represent emphysematous cholecystitis or prior erosion of a gallstone from the gallbladder neck into the duodenum. No gallstone identified within bowel nor evidence of gallstone ileus. Pancreas: Atrophic pancreas without mass Spleen: Normal appearance Adrenals/Urinary Tract: Tiny exophytic cyst at the inferomedial LEFT kidney 1.6 x 1.3 cm image 52. Adrenal glands, kidneys, and ureters otherwise normal appearance. Bladder decompressed. Unremarkable prostate gland. Stomach/Bowel: Mild edema adjacent to the wall of the second portion of the duodenum extending to the proximal third duodenum with only minimal wall thickening; this is felt to be secondary to the adjacent gallbladder process rather than a primary duodenal process. Normal appendix. Scattered distal colonic diverticulosis. Stomach and bowel loops otherwise normal appearance. Vascular/Lymphatic: Atherosclerotic calcifications aorta and iliac arteries, including at the proximal SMA and celiac artery as well as the renal artery origins. No significant adenopathy Reproductive: N/A Other: No free air or free fluid. Small BILATERAL inguinal hernias containing fat. Musculoskeletal: Osseous structures unremarkable. Review of the MIP images confirms the above findings. IMPRESSION: Filling defect at the bifurcation of the LEFT lower lobe pulmonary artery consistent with a  small age-indeterminate pulmonary embolus. Subsegmental atelectasis in BILATERAL lower lobes. Distended gallbladder with thickened wall, significant pericholecystic infiltrative changes, air within the gallbladder, and question minimal gas within the gallbladder wall. These findings could represent emphysematous cholecystitis or could be related to prior erosion of a gallstone from the gallbladder neck to the duodenum. Aortic atherosclerosis and mild coronary arterial calcification. BILATERAL inguinal hernias containing fat. Findings called to Dr. Dayna Barker On 07/20/2016 at 1533 hours. Electronically Signed   By: Lavonia Dana M.D.   On: 07/20/2016 15:34   Ir Perc Cholecystostomy  Result Date: 07/21/2016 INDICATION: ACUTE CHOLECYSTITIS EXAM: CHOLECYSTOSTOMY MEDICATIONS: ZOSYN 3.375 G; The antibiotic was administered within an appropriate time frame prior to the initiation of the procedure. ANESTHESIA/SEDATION: Moderate (conscious) sedation was employed during this procedure. A total of Versed 2.0 mg and Fentanyl 50 mcg was administered intravenously. Moderate Sedation Time: 15 minutes. The patient's level of consciousness and vital signs were monitored continuously by radiology nursing throughout the procedure under my direct supervision. FLUOROSCOPY TIME:  Fluoroscopy Time:  36 seconds (36 mGy). COMPLICATIONS: None immediate. PROCEDURE: Informed written consent was obtained from the patient after a thorough discussion of the procedural risks, benefits and alternatives. All questions were addressed. Maximal Sterile Barrier Technique was utilized including caps, mask, sterile gowns, sterile gloves, sterile drape, hand hygiene and skin antiseptic. A timeout was performed prior to the initiation of the procedure. Previous imaging reviewed. Preliminary ultrasound performed. Gallbladder localized in the right upper quadrant in the mid axillary line through a lower intercostal space. Under sterile conditions and local  anesthesia, ultrasound  percutaneous transhepatic needle access performed of the distended gallbladder. Needle position confirmed with ultrasound. There was return of bile. Guidewire inserted followed by Accustick dilator set. Amplatz guidewire advanced into the gallbladder. Tract dilatation performed to insert a 10 [place] drain. Retention loop formed in the gallbladder. Position confirmed with fluoroscopy. Images obtained for documentation. Catheter secured with a Prolene suture and sterile dressing. Catheter connected to external gravity drainage bag. IMPRESSION: Successful ultrasound and fluoroscopic 10 French percutaneous cholecystostomy as above. Electronically Signed   By: Jerilynn Mages.  Shick M.D.   On: 07/21/2016 11:33   Dg Chest Port 1 View  Result Date: 07/22/2016 CLINICAL DATA:  Cough and wheezing EXAM: PORTABLE CHEST 1 VIEW COMPARISON:  Chest radiograph and chest CT July 20, 2016 FINDINGS: There is new patchy airspace consolidation in the right lower lobe. Lungs elsewhere are clear. Heart is borderline enlarged with pulmonary vascularity within normal limits. No adenopathy. There is degenerative change in each shoulder. There is atherosclerotic calcification in the aorta. IMPRESSION: Patchy airspace disease consistent with a degree of pneumonia in the right upper lobe. Lungs elsewhere clear. Heart borderline enlarged. Aortic atherosclerosis. Electronically Signed   By: Lowella Grip III M.D.   On: 07/22/2016 10:49   Dg Ankle Right Port  Result Date: 07/25/2016 CLINICAL DATA:  Ankle fracture 1 month ago.  Follow-up. EXAM: PORTABLE RIGHT ANKLE - 2 VIEW COMPARISON:  06/04/2016.  CT 06/06/2016. FINDINGS: Two screws are noted in the posterior calcaneus. Plate and screw fixation device in the distal fibula. No hardware complicating feature. Slightly displaced medial malleolar fracture again noted with fracture line remaining evident. IMPRESSION: Internal fixation of the distal fibular fracture and  calcaneal fracture posteriorly. Slightly displaced medial malleolar fracture again noted with fracture line remaining evident. Electronically Signed   By: Rolm Baptise M.D.   On: 07/25/2016 12:54   Dg Abd Portable 1v  Result Date: 07/26/2016 CLINICAL DATA:  Abdominal distention for 3 days EXAM: PORTABLE ABDOMEN - 1 VIEW COMPARISON:  CT abdomen and pelvis July 20, 2016 FINDINGS: There is moderate stool in the colon. There is no bowel dilatation or air-fluid level suggesting bowel obstruction. No free air. Drain noted in right abdomen. Elevation of right hemidiaphragm present. IMPRESSION: Moderate stool in colon. No bowel obstruction or free air evident. Drain in right abdomen present. Electronically Signed   By: Lowella Grip III M.D.   On: 07/26/2016 14:49    ASSESSMENT/PLAN:  Physical deconditioning - for Home health PT and OT, for therapeutic strengthening exercises; fall precautions  Emphysematous cholecystitis S/P percutaneous cholecystostomy drain - percutaneous cholecystostomy tube was placed by IR on 07/21/16 and culture grew enterococcus feceum and staph epidermidis. It was discussed with Dr. Drucilla Schmidt, ID, who recommended linezolid for 3 weeks duration (till 08/17/16) ; follow-up with interventional radiology  Left lower lobe pulmonary embolism -continue Xarelto 20 mg 1 tab by mouth daily and to continue for 6 months, stop date 01/22/17  Right ankle fracture - has short cast ; follows up with [facility] orthopedics and recommended conservative management; RLE nonweightbearing; follow-up with [facility] orthopedics ;  continue Dilaudid 2 mg PO Q AM PRN for pain; Robaxin 750 mg 1 tab by mouth every 6 hours when necessary for muscle spasm  Diabetes mellitus, type II with nephropathy and long-term use of insulin - continue Levemir 100 units/mL 30 units subcutaneous daily at bedtime and HumaLog sliding scale SQ TID  BPH - continue Flomax 0.4 mg 1 capsule by mouth daily  OSA -  continue CPAP @  HS  Dementia with psychosis- continue galantamine ER 16 mg 1 capsule by mouth daily, Quetiapine fumarate 50 mg 1 tab by mouth daily at bedtime  CAD - no chest pains; continue isosorbide mononitrate ER 30 mg 1 tab by mouth daily  Hyperlipidemia - continue simvastatin 40 mg 1 tab by mouth daily in the evening  History of duodenal ulcer - continue pantoprazole 40 mg 1 tab by mouth daily   Hypertension - well-controlled; continue carvedilol 3.125 mg 1 tab by mouth twice a day    Constipation - continue MiraLAX 17 g by mouth BID and Senna S 8.6-50 mg 2 tabs by mouth twice a day  Leukocytosis  - no fever; currently on Linezolid, wbc 11.8      I have filled out patient's discharge paperwork and written prescriptions.  Patient will receive home health PT, OT, SW, Nursing and CNA.  DME provided:  Bariatric rolling walker, weight more than 300 lbs  Total discharge time: Greater than 30 minutes Greater than 50% was spent in counseling and coordination of care.   Discharge time involved coordination of the discharge process with social worker, nursing staff and therapy department. Medical justification for home health services/DME verified.   Torrey Horseman C. Herrick - NP [employer] [phone]

## 2016-08-08 ENCOUNTER — Telehealth: Payer: Self-pay | Admitting: Family Medicine

## 2016-08-08 NOTE — Telephone Encounter (Signed)
Caller with [facility] states that they will be going out today to see pt.

## 2016-08-08 NOTE — Telephone Encounter (Signed)
Called and gave the notice to nicole with [facility] that pt has not established with [place] yet so we cannot sign any orders. She stated an understanding.

## 2016-08-08 NOTE — Care Management Note (Signed)
Case Management Note  Patient Details  Name: [name] MRN: [id number] Date of Birth: [birth year]  Subjective/Objective:  Received request to f/u on patient. I called patient to get more info-TC [phone]. Once researched-patient was d/c on 07/27/16 to [facility] from [facility]. Informed patient to contact [facility] for clarification on his concerns. Patient voiced understanding.                  Action/Plan:   Expected Discharge Date:                  Expected Discharge Plan:  Home/Self Care  In-House Referral:     Discharge planning Services     Post Acute Care Choice:    Choice offered to:     DME Arranged:    DME Agency:     HH Arranged:    HH Agency:     Status of Service:  In process, will continue to follow  If discussed at Long Length of Stay Meetings, dates discussed:    Additional Comments:  Dessa Phi, RN 08/08/2016, 2:06 PM

## 2016-08-08 NOTE — Telephone Encounter (Signed)
FYI, Pt will not establish with you until 09/09/16.[name]

## 2016-08-08 NOTE — Telephone Encounter (Signed)
Noted.  Cannot sign any orders until pt establishes

## 2016-08-10 DIAGNOSIS — Z48815 Encounter for surgical aftercare following surgery on the digestive system: Secondary | ICD-10-CM | POA: Diagnosis not present

## 2016-08-10 DIAGNOSIS — C4A0 Merkel cell carcinoma of lip: Secondary | ICD-10-CM | POA: Diagnosis not present

## 2016-08-10 DIAGNOSIS — E1121 Type 2 diabetes mellitus with diabetic nephropathy: Secondary | ICD-10-CM | POA: Diagnosis not present

## 2016-08-10 DIAGNOSIS — I2699 Other pulmonary embolism without acute cor pulmonale: Secondary | ICD-10-CM | POA: Diagnosis not present

## 2016-08-10 DIAGNOSIS — S82891D Other fracture of right lower leg, subsequent encounter for closed fracture with routine healing: Secondary | ICD-10-CM | POA: Diagnosis not present

## 2016-08-10 DIAGNOSIS — G309 Alzheimer's disease, unspecified: Secondary | ICD-10-CM | POA: Diagnosis not present

## 2016-08-11 DIAGNOSIS — K819 Cholecystitis, unspecified: Secondary | ICD-10-CM | POA: Diagnosis not present

## 2016-08-14 ENCOUNTER — Encounter (HOSPITAL_BASED_OUTPATIENT_CLINIC_OR_DEPARTMENT_OTHER): Payer: Self-pay | Admitting: Emergency Medicine

## 2016-08-14 ENCOUNTER — Emergency Department (HOSPITAL_BASED_OUTPATIENT_CLINIC_OR_DEPARTMENT_OTHER): Payer: Medicare Other

## 2016-08-14 ENCOUNTER — Emergency Department (HOSPITAL_BASED_OUTPATIENT_CLINIC_OR_DEPARTMENT_OTHER)
Admission: EM | Admit: 2016-08-14 | Discharge: 2016-08-14 | Disposition: A | Discharge status: Home / Self Care | Payer: Medicare Other | Source: Home / Self Care | Attending: Emergency Medicine | Admitting: Emergency Medicine

## 2016-08-14 ENCOUNTER — Emergency Department (HOSPITAL_COMMUNITY): Payer: Medicare Other

## 2016-08-14 ENCOUNTER — Inpatient Hospital Stay (HOSPITAL_COMMUNITY)
Admission: EM | Admit: 2016-08-14 | Discharge: 2016-08-18 | DRG: 919 | Disposition: A | Discharge status: Home / Self Care | Payer: Medicare Other | Attending: Internal Medicine | Admitting: Internal Medicine

## 2016-08-14 ENCOUNTER — Encounter (HOSPITAL_COMMUNITY): Payer: Self-pay | Admitting: *Deleted

## 2016-08-14 DIAGNOSIS — M549 Dorsalgia, unspecified: Secondary | ICD-10-CM | POA: Diagnosis present

## 2016-08-14 DIAGNOSIS — Z794 Long term (current) use of insulin: Secondary | ICD-10-CM | POA: Insufficient documentation

## 2016-08-14 DIAGNOSIS — Z86711 Personal history of pulmonary embolism: Secondary | ICD-10-CM

## 2016-08-14 DIAGNOSIS — E785 Hyperlipidemia, unspecified: Secondary | ICD-10-CM | POA: Diagnosis present

## 2016-08-14 DIAGNOSIS — Z0000 Encounter for general adult medical examination without abnormal findings: Secondary | ICD-10-CM

## 2016-08-14 DIAGNOSIS — I129 Hypertensive chronic kidney disease with stage 1 through stage 4 chronic kidney disease, or unspecified chronic kidney disease: Secondary | ICD-10-CM | POA: Insufficient documentation

## 2016-08-14 DIAGNOSIS — N390 Urinary tract infection, site not specified: Secondary | ICD-10-CM

## 2016-08-14 DIAGNOSIS — Z6839 Body mass index (BMI) 39.0-39.9, adult: Secondary | ICD-10-CM

## 2016-08-14 DIAGNOSIS — F32A Depression, unspecified: Secondary | ICD-10-CM | POA: Diagnosis present

## 2016-08-14 DIAGNOSIS — F329 Major depressive disorder, single episode, unspecified: Secondary | ICD-10-CM | POA: Diagnosis present

## 2016-08-14 DIAGNOSIS — A419 Sepsis, unspecified organism: Secondary | ICD-10-CM | POA: Diagnosis present

## 2016-08-14 DIAGNOSIS — Z79899 Other long term (current) drug therapy: Secondary | ICD-10-CM | POA: Insufficient documentation

## 2016-08-14 DIAGNOSIS — E1121 Type 2 diabetes mellitus with diabetic nephropathy: Secondary | ICD-10-CM | POA: Diagnosis not present

## 2016-08-14 DIAGNOSIS — K219 Gastro-esophageal reflux disease without esophagitis: Secondary | ICD-10-CM | POA: Diagnosis present

## 2016-08-14 DIAGNOSIS — Z720 Tobacco use: Secondary | ICD-10-CM

## 2016-08-14 DIAGNOSIS — F4310 Post-traumatic stress disorder, unspecified: Secondary | ICD-10-CM | POA: Diagnosis present

## 2016-08-14 DIAGNOSIS — E1122 Type 2 diabetes mellitus with diabetic chronic kidney disease: Secondary | ICD-10-CM | POA: Diagnosis present

## 2016-08-14 DIAGNOSIS — Z981 Arthrodesis status: Secondary | ICD-10-CM

## 2016-08-14 DIAGNOSIS — F17220 Nicotine dependence, chewing tobacco, uncomplicated: Secondary | ICD-10-CM | POA: Insufficient documentation

## 2016-08-14 DIAGNOSIS — D696 Thrombocytopenia, unspecified: Secondary | ICD-10-CM | POA: Diagnosis not present

## 2016-08-14 DIAGNOSIS — I10 Essential (primary) hypertension: Secondary | ICD-10-CM | POA: Diagnosis present

## 2016-08-14 DIAGNOSIS — T85618A Breakdown (mechanical) of other specified internal prosthetic devices, implants and grafts, initial encounter: Secondary | ICD-10-CM | POA: Diagnosis not present

## 2016-08-14 DIAGNOSIS — R7989 Other specified abnormal findings of blood chemistry: Secondary | ICD-10-CM | POA: Diagnosis not present

## 2016-08-14 DIAGNOSIS — E669 Obesity, unspecified: Secondary | ICD-10-CM | POA: Diagnosis present

## 2016-08-14 DIAGNOSIS — R945 Abnormal results of liver function studies: Secondary | ICD-10-CM

## 2016-08-14 DIAGNOSIS — R1084 Generalized abdominal pain: Secondary | ICD-10-CM | POA: Diagnosis not present

## 2016-08-14 DIAGNOSIS — G4733 Obstructive sleep apnea (adult) (pediatric): Secondary | ICD-10-CM | POA: Diagnosis present

## 2016-08-14 DIAGNOSIS — Z974 Presence of external hearing-aid: Secondary | ICD-10-CM

## 2016-08-14 DIAGNOSIS — E119 Type 2 diabetes mellitus without complications: Secondary | ICD-10-CM | POA: Diagnosis present

## 2016-08-14 DIAGNOSIS — R1013 Epigastric pain: Secondary | ICD-10-CM | POA: Diagnosis not present

## 2016-08-14 DIAGNOSIS — T85510A Breakdown (mechanical) of bile duct prosthesis, initial encounter: Secondary | ICD-10-CM | POA: Diagnosis not present

## 2016-08-14 DIAGNOSIS — G8929 Other chronic pain: Secondary | ICD-10-CM | POA: Diagnosis present

## 2016-08-14 DIAGNOSIS — N183 Chronic kidney disease, stage 3 (moderate): Secondary | ICD-10-CM

## 2016-08-14 DIAGNOSIS — E7800 Pure hypercholesterolemia, unspecified: Secondary | ICD-10-CM | POA: Diagnosis present

## 2016-08-14 DIAGNOSIS — R1011 Right upper quadrant pain: Secondary | ICD-10-CM

## 2016-08-14 DIAGNOSIS — S82841A Displaced bimalleolar fracture of right lower leg, initial encounter for closed fracture: Secondary | ICD-10-CM | POA: Diagnosis present

## 2016-08-14 DIAGNOSIS — R109 Unspecified abdominal pain: Secondary | ICD-10-CM

## 2016-08-14 DIAGNOSIS — Y838 Other surgical procedures as the cause of abnormal reaction of the patient, or of later complication, without mention of misadventure at the time of the procedure: Secondary | ICD-10-CM | POA: Diagnosis present

## 2016-08-14 DIAGNOSIS — Z8711 Personal history of peptic ulcer disease: Secondary | ICD-10-CM

## 2016-08-14 DIAGNOSIS — R740 Nonspecific elevation of levels of transaminase and lactic acid dehydrogenase [LDH]: Secondary | ICD-10-CM | POA: Diagnosis present

## 2016-08-14 DIAGNOSIS — T85518A Breakdown (mechanical) of other gastrointestinal prosthetic devices, implants and grafts, initial encounter: Secondary | ICD-10-CM | POA: Diagnosis present

## 2016-08-14 DIAGNOSIS — N400 Enlarged prostate without lower urinary tract symptoms: Secondary | ICD-10-CM | POA: Diagnosis present

## 2016-08-14 DIAGNOSIS — K810 Acute cholecystitis: Secondary | ICD-10-CM | POA: Diagnosis present

## 2016-08-14 DIAGNOSIS — R58 Hemorrhage, not elsewhere classified: Secondary | ICD-10-CM

## 2016-08-14 HISTORY — DX: Other pulmonary embolism without acute cor pulmonale: I26.99

## 2016-08-14 LAB — CBC WITH DIFFERENTIAL/PLATELET
BASOS PCT: 0 %
BASOS PCT: 0 %
Basophils Absolute: 0 10*3/uL (ref 0.0–0.1)
Basophils Absolute: 0 10*3/uL (ref 0.0–0.1)
EOS ABS: 0.3 10*3/uL (ref 0.0–0.7)
EOS ABS: 0.3 10*3/uL (ref 0.0–0.7)
EOS PCT: 4 %
EOS PCT: 4 %
HCT: 39 % (ref 39.0–52.0)
HEMATOCRIT: 37.8 % — AB (ref 39.0–52.0)
HEMOGLOBIN: 12.4 g/dL — AB (ref 13.0–17.0)
Hemoglobin: 12.4 g/dL — ABNORMAL LOW (ref 13.0–17.0)
LYMPHS ABS: 1.3 10*3/uL (ref 0.7–4.0)
Lymphocytes Relative: 19 %
Lymphocytes Relative: 20 %
Lymphs Abs: 1.7 10*3/uL (ref 0.7–4.0)
MCH: 28 pg (ref 26.0–34.0)
MCH: 28 pg (ref 26.0–34.0)
MCHC: 31.8 g/dL (ref 30.0–36.0)
MCHC: 32.8 g/dL (ref 30.0–36.0)
MCV: 88 fL (ref 78.0–100.0)
MCV: 85.3 fL (ref 78.0–100.0)
MONO ABS: 0.8 10*3/uL (ref 0.1–1.0)
MONOS PCT: 10 %
Monocytes Absolute: 0.9 10*3/uL (ref 0.1–1.0)
Monocytes Relative: 12 %
NEUTROS ABS: 5.6 10*3/uL (ref 1.7–7.7)
NEUTROS PCT: 65 %
Neutro Abs: 4.7 10*3/uL (ref 1.7–7.7)
Neutrophils Relative %: 66 %
PLATELETS: 172 10*3/uL (ref 150–400)
PLATELETS: 178 10*3/uL (ref 150–400)
RBC: 4.43 MIL/uL (ref 4.22–5.81)
RBC: 4.43 MIL/uL (ref 4.22–5.81)
RDW: 14.6 % (ref 11.5–15.5)
RDW: 14.5 % (ref 11.5–15.5)
WBC: 7.3 10*3/uL (ref 4.0–10.5)
WBC: 8.5 10*3/uL (ref 4.0–10.5)

## 2016-08-14 LAB — COMPREHENSIVE METABOLIC PANEL
ALBUMIN: 3.4 g/dL — AB (ref 3.5–5.0)
ALBUMIN: 3.2 g/dL — AB (ref 3.5–5.0)
ALK PHOS: 297 U/L — AB (ref 38–126)
ALT: 43 U/L (ref 17–63)
ALT: 131 U/L — AB (ref 17–63)
ANION GAP: 6 (ref 5–15)
AST: 102 U/L — ABNORMAL HIGH (ref 15–41)
AST: 251 U/L — ABNORMAL HIGH (ref 15–41)
Alkaline Phosphatase: 191 U/L — ABNORMAL HIGH (ref 38–126)
Anion gap: 6 (ref 5–15)
BILIRUBIN TOTAL: 1.3 mg/dL — AB (ref 0.3–1.2)
BUN: 26 mg/dL — ABNORMAL HIGH (ref 6–20)
BUN: 24 mg/dL — ABNORMAL HIGH (ref 6–20)
CALCIUM: 8.6 mg/dL — AB (ref 8.9–10.3)
CHLORIDE: 103 mmol/L (ref 101–111)
CO2: 30 mmol/L (ref 22–32)
CO2: 30 mmol/L (ref 22–32)
CREATININE: 1.01 mg/dL (ref 0.61–1.24)
Calcium: 8.8 mg/dL — ABNORMAL LOW (ref 8.9–10.3)
Chloride: 99 mmol/L — ABNORMAL LOW (ref 101–111)
Creatinine, Ser: 1.09 mg/dL (ref 0.61–1.24)
GFR calc Af Amer: >60 mL/min (ref >60)
GFR calc non Af Amer: >60 mL/min (ref >60)
GFR calc non Af Amer: >60 mL/min (ref >60)
GLUCOSE: 186 mg/dL — AB (ref 65–99)
Glucose, Bld: 191 mg/dL — ABNORMAL HIGH (ref 65–99)
Potassium: 4.4 mmol/L (ref 3.5–5.1)
Potassium: 4.4 mmol/L (ref 3.5–5.1)
SODIUM: 139 mmol/L (ref 135–145)
SODIUM: 135 mmol/L (ref 135–145)
Total Bilirubin: 1.1 mg/dL (ref 0.3–1.2)
Total Protein: 6.5 g/dL (ref 6.5–8.1)
Total Protein: 6.6 g/dL (ref 6.5–8.1)

## 2016-08-14 LAB — LIPASE, BLOOD
Lipase: 12 U/L (ref 11–51)
Lipase: 21 U/L (ref 11–51)

## 2016-08-14 MED ORDER — HYDROMORPHONE HCL 1 MG/ML IJ SOLN
1.0000 mg | Freq: Once | INTRAMUSCULAR | Status: AC
  Administered 2016-08-14: 1 mg via INTRAVENOUS
  Filled 2016-08-14: qty 1

## 2016-08-14 MED ORDER — PROMETHAZINE HCL 25 MG/ML IJ SOLN
6.2500 mg | Freq: Once | INTRAMUSCULAR | Status: AC
  Administered 2016-08-14: 6.25 mg via INTRAVENOUS
  Filled 2016-08-14 (×2): qty 1

## 2016-08-14 MED ORDER — MORPHINE SULFATE (PF) 4 MG/ML IV SOLN
4.0000 mg | Freq: Once | INTRAVENOUS | Status: AC
  Administered 2016-08-14: 4 mg via INTRAVENOUS
  Filled 2016-08-14: qty 1

## 2016-08-14 MED ORDER — IOPAMIDOL (ISOVUE-300) INJECTION 61%
100.0000 mL | Freq: Once | INTRAVENOUS | Status: AC | PRN
  Administered 2016-08-14: 100 mL via INTRAVENOUS

## 2016-08-14 NOTE — ED Notes (Signed)
Patient transported to CT 

## 2016-08-14 NOTE — ED Triage Notes (Signed)
Pt was seen at [facility] earlier today for decreased drainage and pain to his gallbladder drainage tube; this evening pt is having bloody drainage to his tube; Per MD note from [facility] pt had a small plug to his tube which was flushed and was draining prior to dc

## 2016-08-14 NOTE — ED Triage Notes (Signed)
Pt c/o upper abd pain this a.m. After eating breakfast; reports nausea
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
## 2016-08-15 ENCOUNTER — Observation Stay (HOSPITAL_COMMUNITY): Payer: Medicare Other

## 2016-08-15 ENCOUNTER — Encounter (HOSPITAL_COMMUNITY): Payer: Self-pay | Admitting: Radiology

## 2016-08-15 ENCOUNTER — Emergency Department (HOSPITAL_COMMUNITY): Payer: Medicare Other

## 2016-08-15 DIAGNOSIS — K810 Acute cholecystitis: Secondary | ICD-10-CM | POA: Diagnosis not present

## 2016-08-15 DIAGNOSIS — R7989 Other specified abnormal findings of blood chemistry: Secondary | ICD-10-CM | POA: Diagnosis not present

## 2016-08-15 DIAGNOSIS — A419 Sepsis, unspecified organism: Secondary | ICD-10-CM

## 2016-08-15 DIAGNOSIS — E785 Hyperlipidemia, unspecified: Secondary | ICD-10-CM | POA: Diagnosis present

## 2016-08-15 DIAGNOSIS — T85518D Breakdown (mechanical) of other gastrointestinal prosthetic devices, implants and grafts, subsequent encounter: Secondary | ICD-10-CM

## 2016-08-15 DIAGNOSIS — F32A Depression, unspecified: Secondary | ICD-10-CM | POA: Diagnosis present

## 2016-08-15 DIAGNOSIS — I10 Essential (primary) hypertension: Secondary | ICD-10-CM | POA: Diagnosis not present

## 2016-08-15 DIAGNOSIS — Y838 Other surgical procedures as the cause of abnormal reaction of the patient, or of later complication, without mention of misadventure at the time of the procedure: Secondary | ICD-10-CM | POA: Diagnosis present

## 2016-08-15 DIAGNOSIS — R109 Unspecified abdominal pain: Secondary | ICD-10-CM | POA: Diagnosis present

## 2016-08-15 DIAGNOSIS — Z86711 Personal history of pulmonary embolism: Secondary | ICD-10-CM | POA: Diagnosis not present

## 2016-08-15 DIAGNOSIS — Z0000 Encounter for general adult medical examination without abnormal findings: Secondary | ICD-10-CM

## 2016-08-15 DIAGNOSIS — T85518A Breakdown (mechanical) of other gastrointestinal prosthetic devices, implants and grafts, initial encounter: Secondary | ICD-10-CM | POA: Diagnosis present

## 2016-08-15 DIAGNOSIS — D696 Thrombocytopenia, unspecified: Secondary | ICD-10-CM | POA: Diagnosis present

## 2016-08-15 DIAGNOSIS — N390 Urinary tract infection, site not specified: Secondary | ICD-10-CM

## 2016-08-15 DIAGNOSIS — N400 Enlarged prostate without lower urinary tract symptoms: Secondary | ICD-10-CM | POA: Diagnosis present

## 2016-08-15 DIAGNOSIS — E1121 Type 2 diabetes mellitus with diabetic nephropathy: Secondary | ICD-10-CM | POA: Diagnosis present

## 2016-08-15 DIAGNOSIS — S82841S Displaced bimalleolar fracture of right lower leg, sequela: Secondary | ICD-10-CM | POA: Diagnosis not present

## 2016-08-15 DIAGNOSIS — Z6839 Body mass index (BMI) 39.0-39.9, adult: Secondary | ICD-10-CM | POA: Diagnosis not present

## 2016-08-15 DIAGNOSIS — R1011 Right upper quadrant pain: Secondary | ICD-10-CM

## 2016-08-15 DIAGNOSIS — G8929 Other chronic pain: Secondary | ICD-10-CM | POA: Diagnosis present

## 2016-08-15 DIAGNOSIS — F329 Major depressive disorder, single episode, unspecified: Secondary | ICD-10-CM | POA: Diagnosis present

## 2016-08-15 DIAGNOSIS — E7800 Pure hypercholesterolemia, unspecified: Secondary | ICD-10-CM | POA: Diagnosis present

## 2016-08-15 DIAGNOSIS — Z981 Arthrodesis status: Secondary | ICD-10-CM | POA: Diagnosis not present

## 2016-08-15 DIAGNOSIS — I129 Hypertensive chronic kidney disease with stage 1 through stage 4 chronic kidney disease, or unspecified chronic kidney disease: Secondary | ICD-10-CM | POA: Diagnosis present

## 2016-08-15 DIAGNOSIS — F3289 Other specified depressive episodes: Secondary | ICD-10-CM | POA: Diagnosis not present

## 2016-08-15 DIAGNOSIS — K219 Gastro-esophageal reflux disease without esophagitis: Secondary | ICD-10-CM | POA: Diagnosis present

## 2016-08-15 DIAGNOSIS — T85618A Breakdown (mechanical) of other specified internal prosthetic devices, implants and grafts, initial encounter: Secondary | ICD-10-CM | POA: Diagnosis present

## 2016-08-15 DIAGNOSIS — R945 Abnormal results of liver function studies: Secondary | ICD-10-CM

## 2016-08-15 DIAGNOSIS — Z8711 Personal history of peptic ulcer disease: Secondary | ICD-10-CM | POA: Diagnosis not present

## 2016-08-15 DIAGNOSIS — F4310 Post-traumatic stress disorder, unspecified: Secondary | ICD-10-CM | POA: Diagnosis present

## 2016-08-15 DIAGNOSIS — M549 Dorsalgia, unspecified: Secondary | ICD-10-CM | POA: Diagnosis present

## 2016-08-15 DIAGNOSIS — E669 Obesity, unspecified: Secondary | ICD-10-CM | POA: Diagnosis present

## 2016-08-15 DIAGNOSIS — S82841D Displaced bimalleolar fracture of right lower leg, subsequent encounter for closed fracture with routine healing: Secondary | ICD-10-CM

## 2016-08-15 DIAGNOSIS — E1122 Type 2 diabetes mellitus with diabetic chronic kidney disease: Secondary | ICD-10-CM | POA: Diagnosis present

## 2016-08-15 DIAGNOSIS — T83090A Other mechanical complication of cystostomy catheter, initial encounter: Secondary | ICD-10-CM | POA: Diagnosis not present

## 2016-08-15 DIAGNOSIS — G4733 Obstructive sleep apnea (adult) (pediatric): Secondary | ICD-10-CM | POA: Diagnosis present

## 2016-08-15 DIAGNOSIS — Z974 Presence of external hearing-aid: Secondary | ICD-10-CM | POA: Diagnosis not present

## 2016-08-15 DIAGNOSIS — N183 Chronic kidney disease, stage 3 (moderate): Secondary | ICD-10-CM | POA: Diagnosis present

## 2016-08-15 DIAGNOSIS — R740 Nonspecific elevation of levels of transaminase and lactic acid dehydrogenase [LDH]: Secondary | ICD-10-CM | POA: Diagnosis present

## 2016-08-15 DIAGNOSIS — E784 Other hyperlipidemia: Secondary | ICD-10-CM | POA: Diagnosis not present

## 2016-08-15 HISTORY — PX: IR GENERIC HISTORICAL: IMG1180011

## 2016-08-15 LAB — COMPREHENSIVE METABOLIC PANEL
ALK PHOS: 297 U/L — AB (ref 38–126)
ALT: 220 U/L — AB (ref 17–63)
ANION GAP: 6 (ref 5–15)
AST: 278 U/L — ABNORMAL HIGH (ref 15–41)
Albumin: 2.9 g/dL — ABNORMAL LOW (ref 3.5–5.0)
BILIRUBIN TOTAL: 2.2 mg/dL — AB (ref 0.3–1.2)
BUN: 23 mg/dL — ABNORMAL HIGH (ref 6–20)
CO2: 29 mmol/L (ref 22–32)
CREATININE: 0.93 mg/dL (ref 0.61–1.24)
Calcium: 8.3 mg/dL — ABNORMAL LOW (ref 8.9–10.3)
Chloride: 100 mmol/L — ABNORMAL LOW (ref 101–111)
Glucose, Bld: 175 mg/dL — ABNORMAL HIGH (ref 65–99)
Potassium: 4.3 mmol/L (ref 3.5–5.1)
SODIUM: 135 mmol/L (ref 135–145)
TOTAL PROTEIN: 5.8 g/dL — AB (ref 6.5–8.1)

## 2016-08-15 LAB — GLUCOSE, CAPILLARY
GLUCOSE-CAPILLARY: 149 mg/dL — AB (ref 65–99)
GLUCOSE-CAPILLARY: 105 mg/dL — AB (ref 65–99)
Glucose-Capillary: 174 mg/dL — ABNORMAL HIGH (ref 65–99)
Glucose-Capillary: 128 mg/dL — ABNORMAL HIGH (ref 65–99)

## 2016-08-15 LAB — PROTIME-INR
INR: 1.47
PROTHROMBIN TIME: 18 s — AB (ref 11.4–15.2)

## 2016-08-15 LAB — CBC
HCT: 33.6 % — ABNORMAL LOW (ref 39.0–52.0)
HEMOGLOBIN: 11 g/dL — AB (ref 13.0–17.0)
MCH: 27.8 pg (ref 26.0–34.0)
MCHC: 32.7 g/dL (ref 30.0–36.0)
MCV: 84.8 fL (ref 78.0–100.0)
PLATELETS: 158 10*3/uL (ref 150–400)
RBC: 3.96 MIL/uL — AB (ref 4.22–5.81)
RDW: 14.7 % (ref 11.5–15.5)
WBC: 7.3 10*3/uL (ref 4.0–10.5)

## 2016-08-15 LAB — LACTIC ACID, PLASMA
LACTIC ACID, VENOUS: 2.1 mmol/L — AB (ref 0.5–1.9)
Lactic Acid, Venous: 1.9 mmol/L (ref 0.5–1.9)

## 2016-08-15 LAB — HEPARIN LEVEL (UNFRACTIONATED): HEPARIN UNFRACTIONATED: 1.28 [IU]/mL — AB (ref 0.30–0.70)

## 2016-08-15 LAB — APTT: aPTT: 36 seconds (ref 24–36)

## 2016-08-15 LAB — PROCALCITONIN: Procalcitonin: 0.38 ng/mL

## 2016-08-15 MED ORDER — ZOLPIDEM TARTRATE 5 MG PO TABS
5.0000 mg | ORAL_TABLET | Freq: Every evening | ORAL | Status: DC | PRN
  Administered 2016-08-15 – 2016-08-16 (×2): 5 mg via ORAL
  Filled 2016-08-15 (×2): qty 1

## 2016-08-15 MED ORDER — SODIUM CHLORIDE 0.9 % IV BOLUS (SEPSIS): 500.0000 mL | Freq: Once | INTRAVENOUS | Status: DC

## 2016-08-15 MED ORDER — SODIUM CHLORIDE 0.9% FLUSH
3.0000 mL | Freq: Two times a day (BID) | INTRAVENOUS | Status: DC
  Administered 2016-08-15 – 2016-08-17 (×3): 3 mL via INTRAVENOUS

## 2016-08-15 MED ORDER — POLYETHYLENE GLYCOL 3350 17 G PO PACK
17.0000 g | PACK | Freq: Every day | ORAL | Status: DC
  Administered 2016-08-15 – 2016-08-16 (×2): 17 g via ORAL
  Filled 2016-08-15 (×4): qty 1

## 2016-08-15 MED ORDER — HEPARIN (PORCINE) IN NACL 100-0.45 UNIT/ML-% IJ SOLN
1400.0000 [IU]/h | INTRAMUSCULAR | Status: DC
  Administered 2016-08-15: 1400 [IU]/h via INTRAVENOUS

## 2016-08-15 MED ORDER — SODIUM CHLORIDE 0.9 % IV BOLUS (SEPSIS)
2500.0000 mL | Freq: Once | INTRAVENOUS | Status: AC
  Administered 2016-08-15: 2500 mL via INTRAVENOUS

## 2016-08-15 MED ORDER — INSULIN DETEMIR 100 UNIT/ML ~~LOC~~ SOLN
20.0000 [IU] | Freq: Every day | SUBCUTANEOUS | Status: DC
  Administered 2016-08-17: 20 [IU] via SUBCUTANEOUS
  Filled 2016-08-15 (×5): qty 0.2

## 2016-08-15 MED ORDER — METHOCARBAMOL 500 MG PO TABS: 750.0000 mg | ORAL_TABLET | Freq: Four times a day (QID) | ORAL | Status: DC | PRN

## 2016-08-15 MED ORDER — POLYVINYL ALCOHOL 1.4 % OP SOLN
1.0000 [drp] | Freq: Two times a day (BID) | OPHTHALMIC | Status: DC
  Administered 2016-08-15 – 2016-08-18 (×3): 1 [drp] via OPHTHALMIC
  Filled 2016-08-15: qty 15

## 2016-08-15 MED ORDER — SODIUM CHLORIDE 0.9 % IV SOLN
INTRAVENOUS | Status: DC
  Administered 2016-08-15 – 2016-08-16 (×2): via INTRAVENOUS

## 2016-08-15 MED ORDER — QUETIAPINE FUMARATE 50 MG PO TABS
50.0000 mg | ORAL_TABLET | Freq: Every day | ORAL | Status: DC
  Administered 2016-08-15 – 2016-08-17 (×3): 50 mg via ORAL
  Filled 2016-08-15: qty 1
  Filled 2016-08-15: qty 2
  Filled 2016-08-15: qty 1
  Filled 2016-08-15 (×2): qty 2
  Filled 2016-08-15: qty 1

## 2016-08-15 MED ORDER — MAGNESIUM OXIDE 400 (241.3 MG) MG PO TABS
400.0000 mg | ORAL_TABLET | Freq: Two times a day (BID) | ORAL | Status: DC
  Administered 2016-08-15 – 2016-08-18 (×7): 400 mg via ORAL
  Filled 2016-08-15 (×8): qty 1

## 2016-08-15 MED ORDER — LINEZOLID 600 MG PO TABS: 600.0000 mg | ORAL_TABLET | Freq: Two times a day (BID) | ORAL | Status: DC

## 2016-08-15 MED ORDER — GALANTAMINE HYDROBROMIDE ER 8 MG PO CP24
16.0000 mg | ORAL_CAPSULE | Freq: Every day | ORAL | Status: DC
  Filled 2016-08-15: qty 2

## 2016-08-15 MED ORDER — PROSIGHT PO TABS
1.0000 | ORAL_TABLET | Freq: Two times a day (BID) | ORAL | Status: DC
  Administered 2016-08-15 – 2016-08-18 (×7): 1 via ORAL
  Filled 2016-08-15 (×7): qty 1

## 2016-08-15 MED ORDER — MENTHOL (TOPICAL ANALGESIC) 5 % EX PTCH: 1.0000 | MEDICATED_PATCH | Freq: Every day | CUTANEOUS | Status: DC

## 2016-08-15 MED ORDER — VANCOMYCIN HCL 10 G IV SOLR
1500.0000 mg | INTRAVENOUS | Status: DC
  Administered 2016-08-16 – 2016-08-17 (×2): 1500 mg via INTRAVENOUS
  Filled 2016-08-15 (×2): qty 1500

## 2016-08-15 MED ORDER — IOPAMIDOL (ISOVUE-300) INJECTION 61%
100.0000 mL | Freq: Once | INTRAVENOUS | Status: AC | PRN
  Administered 2016-08-15: 100 mL via INTRAVENOUS

## 2016-08-15 MED ORDER — SIMVASTATIN 40 MG PO TABS
40.0000 mg | ORAL_TABLET | Freq: Every evening | ORAL | Status: DC
  Administered 2016-08-15 – 2016-08-17 (×3): 40 mg via ORAL
  Filled 2016-08-15 (×3): qty 1

## 2016-08-15 MED ORDER — HEPARIN (PORCINE) IN NACL 100-0.45 UNIT/ML-% IJ SOLN
1400.0000 [IU]/h | INTRAMUSCULAR | Status: DC
  Filled 2016-08-15: qty 250

## 2016-08-15 MED ORDER — ALBUTEROL SULFATE (2.5 MG/3ML) 0.083% IN NEBU: 2.5000 mg | INHALATION_SOLUTION | Freq: Four times a day (QID) | RESPIRATORY_TRACT | Status: DC | PRN

## 2016-08-15 MED ORDER — CYANOCOBALAMIN 500 MCG PO TABS
500.0000 ug | ORAL_TABLET | Freq: Two times a day (BID) | ORAL | Status: DC
  Administered 2016-08-15 – 2016-08-18 (×7): 500 ug via ORAL
  Filled 2016-08-15 (×7): qty 1

## 2016-08-15 MED ORDER — HYDROMORPHONE HCL 2 MG/ML IJ SOLN
1.0000 mg | INTRAMUSCULAR | Status: DC | PRN
  Administered 2016-08-16 – 2016-08-17 (×2): 1 mg via INTRAVENOUS
  Filled 2016-08-15 (×2): qty 1

## 2016-08-15 MED ORDER — INSULIN ASPART 100 UNIT/ML ~~LOC~~ SOLN
0.0000 [IU] | Freq: Three times a day (TID) | SUBCUTANEOUS | Status: DC
  Administered 2016-08-15: 1 [IU] via SUBCUTANEOUS
  Administered 2016-08-15: 2 [IU] via SUBCUTANEOUS
  Administered 2016-08-15: 1 [IU] via SUBCUTANEOUS
  Administered 2016-08-16: 2 [IU] via SUBCUTANEOUS
  Administered 2016-08-16 – 2016-08-17 (×2): 1 [IU] via SUBCUTANEOUS
  Administered 2016-08-17: 2 [IU] via SUBCUTANEOUS

## 2016-08-15 MED ORDER — ONDANSETRON HCL 4 MG/2ML IJ SOLN: 4.0000 mg | Freq: Three times a day (TID) | INTRAMUSCULAR | Status: DC | PRN

## 2016-08-15 MED ORDER — CARVEDILOL 3.125 MG PO TABS
3.1250 mg | ORAL_TABLET | Freq: Two times a day (BID) | ORAL | Status: DC
  Administered 2016-08-15 – 2016-08-18 (×7): 3.125 mg via ORAL
  Filled 2016-08-15 (×7): qty 1

## 2016-08-15 MED ORDER — HYDROCOD POLST-CPM POLST ER 10-8 MG/5ML PO SUER: 5.0000 mL | Freq: Two times a day (BID) | ORAL | Status: DC | PRN

## 2016-08-15 MED ORDER — PANTOPRAZOLE SODIUM 40 MG PO TBEC
40.0000 mg | DELAYED_RELEASE_TABLET | Freq: Every day | ORAL | Status: DC
  Administered 2016-08-15 – 2016-08-18 (×4): 40 mg via ORAL
  Filled 2016-08-15 (×4): qty 1

## 2016-08-15 MED ORDER — ISOSORBIDE MONONITRATE ER 30 MG PO TB24
30.0000 mg | ORAL_TABLET | Freq: Every day | ORAL | Status: DC
  Administered 2016-08-15 – 2016-08-18 (×4): 30 mg via ORAL
  Filled 2016-08-15 (×4): qty 1

## 2016-08-15 MED ORDER — OXYCODONE HCL 5 MG PO TABS
10.0000 mg | ORAL_TABLET | ORAL | Status: DC | PRN
Start: 2016-08-15 — End: 2016-08-17
  Administered 2016-08-15 – 2016-08-16 (×2): 10 mg via ORAL
  Filled 2016-08-15 (×2): qty 2

## 2016-08-15 MED ORDER — GALANTAMINE HYDROBROMIDE 4 MG PO TABS
8.0000 mg | ORAL_TABLET | Freq: Two times a day (BID) | ORAL | Status: DC
  Administered 2016-08-15 – 2016-08-18 (×7): 8 mg via ORAL
  Filled 2016-08-15 (×7): qty 2

## 2016-08-15 MED ORDER — TAMSULOSIN HCL 0.4 MG PO CAPS
0.4000 mg | ORAL_CAPSULE | Freq: Every day | ORAL | Status: DC
  Administered 2016-08-15 – 2016-08-17 (×3): 0.4 mg via ORAL
  Filled 2016-08-15 (×3): qty 1

## 2016-08-15 MED ORDER — LIDOCAINE HCL 1 % IJ SOLN
INTRAMUSCULAR | Status: AC
  Filled 2016-08-15: qty 20

## 2016-08-15 MED ORDER — MUSCLE RUB 10-15 % EX CREA
TOPICAL_CREAM | CUTANEOUS | Status: DC | PRN
  Administered 2016-08-15: 14:00:00 via TOPICAL
  Filled 2016-08-15: qty 85

## 2016-08-15 MED ORDER — SENNOSIDES-DOCUSATE SODIUM 8.6-50 MG PO TABS
2.0000 | ORAL_TABLET | Freq: Two times a day (BID) | ORAL | Status: DC
Start: 2016-08-15 — End: 2016-08-18
  Administered 2016-08-15 – 2016-08-18 (×8): 2 via ORAL
  Filled 2016-08-15 (×8): qty 2

## 2016-08-15 MED ORDER — VANCOMYCIN HCL 10 G IV SOLR
2500.0000 mg | Freq: Once | INTRAVENOUS | Status: AC
  Administered 2016-08-15: 2500 mg via INTRAVENOUS
  Filled 2016-08-15: qty 2000

## 2016-08-15 MED ORDER — IOPAMIDOL (ISOVUE-300) INJECTION 61%
INTRAVENOUS | Status: AC
  Filled 2016-08-15: qty 100

## 2016-08-15 MED ORDER — MELATONIN 5 MG PO TABS: 5.0000 mg | ORAL_TABLET | Freq: Every day | ORAL | Status: DC

## 2016-08-15 MED ORDER — LIDOCAINE HCL 1 % IJ SOLN
INTRAMUSCULAR | Status: DC | PRN
  Administered 2016-08-15: 5 mL via INTRADERMAL

## 2016-08-15 MED ORDER — IOPAMIDOL (ISOVUE-300) INJECTION 61%
INTRAVENOUS | Status: AC
  Administered 2016-08-15: 15 mL
  Filled 2016-08-15: qty 50

## 2016-08-15 MED ORDER — VITAMIN D 1000 UNITS PO TABS
2000.0000 [IU] | ORAL_TABLET | Freq: Every day | ORAL | Status: DC
  Administered 2016-08-15 – 2016-08-18 (×4): 2000 [IU] via ORAL
  Filled 2016-08-15 (×4): qty 2

## 2016-08-15 NOTE — Progress Notes (Signed)
Pharmacy Antibiotic Note  [name] is a 82 y.o. male with hx of cholecystitis treated with Zyvox and percutaneous cholecystostomy tube now with UQ abdominal pain admitted on 08/14/2016 with sepsis.  Pharmacy has been consulted for Vancomycin dosing.  Plan: Vancomycin 2500mg  x1 then 1500mg  IV q24h  VT=15-20 mg/L  Height: 5\' 11"  (180.3 cm) Weight: 283 lb 8.2 oz (128.6 kg) IBW/kg (Calculated) : 75.3  Temp (24hrs), Avg:98.5 F (36.9 C), Min:97.5 F (36.4 C), Max:100.4 F (38 C)   Recent Labs Lab 08/14/16 0948 08/14/16 2201  WBC 7.3 8.5  CREATININE 1.09 1.01    Estimated Creatinine Clearance: 77 mL/min (by C-G formula based on SCr of 1.01 mg/dL).    No Known Allergies  Antimicrobials this admission: 12/18 vancomycin >>    >>   Dose adjustments this admission:   Microbiology results:  BCx:   UCx:    Sputum:    MRSA PCR:   Thank you for allowing pharmacy to be a part of this patient's care.  Dorrene German 08/15/2016 4:52 AM

## 2016-08-15 NOTE — Progress Notes (Addendum)
Patient ID: [name], male   DOB: 1932-06-29, 82 y.o.   MRN: [id number]  Pt admitted after midnight. For details please refer to admission note done 08/15/2016. Pt wife at the bedside this am.  82 y.o. male with past medical history significant for emphysematous cholecystitis s/p percutaneous cholecystostomy drain 07/21/2016, PE on Xarelto, hypertension, hyperlipidemia, diabetes mellitus, GERD, depression, OSA not on CPAP, PTSD, chronic back pain, gastric perforation, obesity who presented to [facility] with abdominal pain. Abdominal pain started the morning of the admission, mainly in the right upper quadrant, constant, sharp, 8 out of 10 in intensity and nonradiating. Patient's wife also noted blood in the drain.  In ED, patient was febrile with T max 100.67F, HR 58-101, RR 16-24, oxygen saturation 99% on room air. CT abdomen showed cholecystostomy tube is in good position, coiling within the gallbladder. The mucosal enhancement and adjacent fat stranding suggests continued inflammation and/or infection. Blood work showed lipase of 21, ALP 297, AST 251, ALT 131, total bilirubin 1.3. General surgeon, Dr. Brantley Stage was consulted by EDP.  Assessment and Plan:  Abdominal pain, sepsis in the setting of recent emphysematous cholecystitis s/p perc cholecystostomy drain:  - CT abdomen showed cholecystostomy tube is in good position, coiling within the gallbladder. The mucosal enhancement and adjacent fat stranding suggests continued inflammation and/or infection - US abdomen showed cholecystostomy within the gallbladder. The gallbladder is only partially distended. There is thickening of the gallbladder wall likely combination of underdistention and a degree of inflammation. No pericholecystic fluid collection. - IR consulted, we appreciate their input  - Continue vancomycin (switched from zyvox) - Blood cx pending    [name] [facility] [phone]
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
## 2016-08-15 NOTE — Progress Notes (Signed)
RT spoke with patient about wearing CPAP and the patient stated he doesn't wear CPAP and isn't going to wear the CPAP here at the hospital.  RT advised if he changes his mind to notify respiratory.  RT will continue to monitor.

## 2016-08-15 NOTE — Progress Notes (Signed)
PHARMACIST - PHYSICIAN ORDER COMMUNICATION  CONCERNING: P&T Medication Policy on Herbal Medications  DESCRIPTION:  This patient's order for:  melatonin  has been noted.  This product(s) is classified as an "herbal" or natural product. Due to a lack of definitive safety studies or FDA approval, nonstandard manufacturing practices, plus the potential risk of unknown drug-drug interactions while on inpatient medications, the Pharmacy and Therapeutics Committee does not permit the use of "herbal" or natural products of this type within [facility].   ACTION TAKEN: The pharmacy department is unable to verify this order at this time and your patient has been informed of this safety policy. Please reevaluate patient's clinical condition at discharge and address if the herbal or natural product(s) should be resumed at that time.   Thanks Dorrene German 08/15/2016 4:44 AM

## 2016-08-15 NOTE — Progress Notes (Signed)
CRITICAL VALUE ALERT  Critical value received:  Lactic acid 2.1  Date of notification:  08/15/16   Time of notification:  0555  Critical value read back: Yes   Nurse who received alert: Elwin Sleight RN   MD notified (1st page):  Raliegh Ip Schorr   Time of first page:  0559   MD notified (2nd page):  Time of second page:  Responding MD:    Time MD responded:

## 2016-08-15 NOTE — Procedures (Signed)
Interventional Radiology Procedure Note  Procedure: Exchange of perc chole tube, for new 45F drain.  Findings:  New tube within GB. To gravity.  Complications: None Recommendations:  - Record output - Follow up in VIR drain clinic in 2-3 weeks. - Do not submerge - Routine drain care - BID-TID drain flush with sterile saline   Signed,  Dulcy Fanny. Earleen Newport, DO

## 2016-08-15 NOTE — Progress Notes (Addendum)
ANTICOAGULATION CONSULT NOTE - Initial Consult  Pharmacy Consult for IV Heparin Indication: hx of PE (on PTA xarelto)  No Known Allergies  Patient Measurements: Height: 5\' 11"  (180.3 cm) Weight: 283 lb 8.2 oz (128.6 kg) IBW/kg (Calculated) : 75.3 Heparin Dosing Weight: 89 kg  Vital Signs: Temp: 100.4 F (38 C) (12/18 0418) Temp Source: Oral (12/18 0418) BP: 123/64 (12/18 0418) Pulse Rate: 101 (12/18 0418)  Labs:  Recent Labs  08/14/16 0948 08/14/16 2201 08/15/16 0520  HGB 12.4* 12.4* 11.0*  HCT 39.0 37.8* 33.6*  PLT 172 178 158  APTT  --   --  36  LABPROT  --   --  18.0*  INR  --   --  1.47  HEPARINUNFRC  --   --  1.28*  CREATININE 1.09 1.01 0.93    Estimated Creatinine Clearance: 83.7 mL/min (by C-G formula based on SCr of 0.93 mg/dL).   Medical History: Past Medical History:  Diagnosis Date  . Arthritis   . Chronic back pain   . Constipation due to opioid therapy   . Diabetes mellitus without complication ([place])   . Full dentures   . Gastric perforation ([place])   . GERD (gastroesophageal reflux disease)   . Hypercholesteremia   . Hypertension    Pt is not aware that he is treated for HTN, found it listed in "Problem list"  . Pneumonia    1960's  . Post traumatic stress disorder (PTSD)    [place] War  . Pulmonary embolism ([place])   . Sleep apnea    does not  use cpap  . Wears glasses   . Wears hearing aid    both ears    Medications:  Scheduled:  . carvedilol  3.125 mg Oral BID WC  . cholecalciferol  2,000 Units Oral Daily  . cyanocobalamin  500 mcg Oral BID  . galantamine  16 mg Oral Q breakfast  . Hypromellose  1 drop Ophthalmic BID  . insulin aspart  0-9 Units Subcutaneous TID WC  . insulin detemir  20 Units Subcutaneous QHS  . isosorbide mononitrate  30 mg Oral Daily  . magnesium oxide  400 mg Oral BID  . Menthol  1-2 patch Apply externally Daily  . multivitamin  1 tablet Oral BID  . pantoprazole  40 mg Oral Daily  . polyethylene glycol   17 g Oral Daily  . QUEtiapine  50 mg Oral QHS  . senna-docusate  2 tablet Oral BID  . simvastatin  40 mg Oral QPM  . sodium chloride flush  3 mL Intravenous Q12H  . tamsulosin  0.4 mg Oral QPC supper  . [START ON 08/16/2016] vancomycin  1,500 mg Intravenous Q24H   Infusions:  . sodium chloride 100 mL/hr at 08/15/16 0543  . heparin      Assessment: 31 yoM with hx of cholecystitis treated with Zyvox outpt and percutaneous cholecystostomy tube now with UQ abdominal pain.  On xarelto PTA for hx of PE.  IV heparin per Rx for possible procedure. Patient states the last dose of Xarelto 15mg  was taken yesterday 12/17 in the evening (exact time uncertain) and says that is to begin taking 20mg  daily today 12/18.  Therefore, will begin heparin dosing this AM.   Today, 12/18  0520 heparin level = 1.28 and aPTT = 36 sec - will use aptt due to recent xarelto use, until aPTT and heparin level correlate  Goal of Therapy:  Heparin level 0.3-0.7 units/ml Monitor platelets by anticoagulation protocol: Yes  Plan:   Start heparin drip at 1400 units/hr this AM   Check aPTT 8 hours after starting heparin  Daily aPTT and heparin level until correlation   Peggyann Juba, PharmD, BCPS Pager: [phone] 08/15/2016,10:29 AM   Addendum: IV heparin was not started this AM since patient left for IR exchange of perc chole tube.  Per protocol, begin IV heparin 4 hours post-procedure and check aPTT 8hr after starting drip as above.  Peggyann Juba, PharmD, BCPS 08/15/2016 1:13 PM

## 2016-08-15 NOTE — Progress Notes (Signed)
Patient care resumed by this RN at 1500. Agree with previous assessment charted by T Dark, RN. 

## 2016-08-16 DIAGNOSIS — S82841S Displaced bimalleolar fracture of right lower leg, sequela: Secondary | ICD-10-CM

## 2016-08-16 DIAGNOSIS — F3289 Other specified depressive episodes: Secondary | ICD-10-CM

## 2016-08-16 DIAGNOSIS — E785 Hyperlipidemia, unspecified: Secondary | ICD-10-CM

## 2016-08-16 DIAGNOSIS — E1169 Type 2 diabetes mellitus with other specified complication: Secondary | ICD-10-CM

## 2016-08-16 LAB — BASIC METABOLIC PANEL
Anion gap: 6 (ref 5–15)
BUN: 14 mg/dL (ref 6–20)
CHLORIDE: 106 mmol/L (ref 101–111)
CO2: 26 mmol/L (ref 22–32)
CREATININE: 0.72 mg/dL (ref 0.61–1.24)
Calcium: 8.2 mg/dL — ABNORMAL LOW (ref 8.9–10.3)
GFR calc Af Amer: >60 mL/min (ref >60)
GFR calc non Af Amer: >60 mL/min (ref >60)
Glucose, Bld: 108 mg/dL — ABNORMAL HIGH (ref 65–99)
POTASSIUM: 3.8 mmol/L (ref 3.5–5.1)
SODIUM: 138 mmol/L (ref 135–145)

## 2016-08-16 LAB — CBC
HEMATOCRIT: 30.3 % — AB (ref 39.0–52.0)
HEMOGLOBIN: 10.1 g/dL — AB (ref 13.0–17.0)
MCH: 28.3 pg (ref 26.0–34.0)
MCHC: 33.3 g/dL (ref 30.0–36.0)
MCV: 84.9 fL (ref 78.0–100.0)
Platelets: 129 10*3/uL — ABNORMAL LOW (ref 150–400)
RBC: 3.57 MIL/uL — AB (ref 4.22–5.81)
RDW: 14.8 % (ref 11.5–15.5)
WBC: 5.3 10*3/uL (ref 4.0–10.5)

## 2016-08-16 LAB — HEPATITIS PANEL, ACUTE
HCV AB: 0.3 {s_co_ratio} (ref 0.0–0.9)
HEP A IGM: NEGATIVE
HEP B C IGM: NEGATIVE
HEP B S AG: NEGATIVE

## 2016-08-16 LAB — GLUCOSE, CAPILLARY
GLUCOSE-CAPILLARY: 90 mg/dL (ref 65–99)
GLUCOSE-CAPILLARY: 113 mg/dL — AB (ref 65–99)
Glucose-Capillary: 141 mg/dL — ABNORMAL HIGH (ref 65–99)
Glucose-Capillary: 162 mg/dL — ABNORMAL HIGH (ref 65–99)

## 2016-08-16 LAB — APTT: aPTT: 66 seconds — ABNORMAL HIGH (ref 24–36)

## 2016-08-16 MED ORDER — HYDROMORPHONE HCL 2 MG PO TABS
2.0000 mg | ORAL_TABLET | Freq: Once | ORAL | Status: AC
  Administered 2016-08-16: 2 mg via ORAL
  Filled 2016-08-16: qty 1

## 2016-08-16 MED ORDER — RIVAROXABAN 20 MG PO TABS
20.0000 mg | ORAL_TABLET | Freq: Every day | ORAL | Status: DC
  Administered 2016-08-16 – 2016-08-18 (×3): 20 mg via ORAL
  Filled 2016-08-16 (×3): qty 1

## 2016-08-16 NOTE — Progress Notes (Signed)
ANTICOAGULATION CONSULT NOTE - F/u Consult  Pharmacy Consult for IV Heparin Indication: hx of PE (on PTA xarelto)  No Known Allergies  Patient Measurements: Height: 5\' 11"  (180.3 cm) Weight: 283 lb 8.2 oz (128.6 kg) IBW/kg (Calculated) : 75.3 Heparin Dosing Weight: 89 kg  Vital Signs: Temp: 98.7 F (37.1 C) (12/18 2113) Temp Source: Oral (12/18 2113) BP: 115/73 (12/18 2113) Pulse Rate: 96 (12/18 2113)  Labs:  Recent Labs  08/14/16 2201 08/15/16 0520 08/16/16 0059  HGB 12.4* 11.0* 10.1*  HCT 37.8* 33.6* 30.3*  PLT 178 158 129*  APTT  --  36 66*  LABPROT  --  18.0*  --   INR  --  1.47  --   HEPARINUNFRC  --  1.28*  --   CREATININE 1.01 0.93 0.72    Estimated Creatinine Clearance: 97.3 mL/min (by C-G formula based on SCr of 0.72 mg/dL).   Medical History: Past Medical History:  Diagnosis Date  . Arthritis   . Chronic back pain   . Constipation due to opioid therapy   . Diabetes mellitus without complication ([place])   . Full dentures   . Gastric perforation ([place])   . GERD (gastroesophageal reflux disease)   . Hypercholesteremia   . Hypertension    Pt is not aware that he is treated for HTN, found it listed in "Problem list"  . Pneumonia    1960's  . Post traumatic stress disorder (PTSD)    [place] War  . Pulmonary embolism ([place])   . Sleep apnea    does not  use cpap  . Wears glasses   . Wears hearing aid    both ears    Medications:  Scheduled:  . carvedilol  3.125 mg Oral BID WC  . cholecalciferol  2,000 Units Oral Daily  . cyanocobalamin  500 mcg Oral BID  . galantamine  8 mg Oral BID WC  . insulin aspart  0-9 Units Subcutaneous TID WC  . insulin detemir  20 Units Subcutaneous QHS  . isosorbide mononitrate  30 mg Oral Daily  . magnesium oxide  400 mg Oral BID  . multivitamin  1 tablet Oral BID  . pantoprazole  40 mg Oral Daily  . polyethylene glycol  17 g Oral Daily  . polyvinyl alcohol  1 drop Both Eyes BID  . QUEtiapine  50 mg Oral QHS  .  senna-docusate  2 tablet Oral BID  . simvastatin  40 mg Oral QPM  . sodium chloride flush  3 mL Intravenous Q12H  . tamsulosin  0.4 mg Oral QPC supper  . vancomycin  1,500 mg Intravenous Q24H   Infusions:  . sodium chloride 50 mL/hr at 08/15/16 0929  . heparin 1,400 Units/hr (08/15/16 1656)    Assessment: 78 yoM with hx of cholecystitis treated with Zyvox outpt and percutaneous cholecystostomy tube now with UQ abdominal pain.  On xarelto PTA for hx of PE.  IV heparin per Rx for possible procedure. Patient states the last dose of Xarelto 15mg  was taken yesterday 12/17 in the evening (exact time uncertain) and says that is to begin taking 20mg  daily today 12/18.  Therefore, will begin heparin dosing this AM.   12/18  Baseline heparin level = 1.28 and aPTT = 36 sec      - will use aptt due to recent xarelto use, until aPTT and heparin level correlate Today, 12/19  0059 aPtt=66 at goal , no infusion issues, RN stated some blood from drain   Goal of  Therapy:  APtt=66-102 sec Heparin level 0.3-0.7 units/ml Monitor platelets by anticoagulation protocol: Yes   Plan:   Continue heparin drip at 1400 units/hr   Recheck aptt and HL today at 10am  Daily aPTT and heparin level until correlation   Lawana Pai R 08/16/2016,1:58 AM

## 2016-08-16 NOTE — Progress Notes (Signed)
Patient urine tea colored and dark in appearance. Pt states this is new for him. Heparin gtt D/C'd and PO Xarelto given this AM. MD Charlies Silvers paged to be made aware.

## 2016-08-16 NOTE — Progress Notes (Addendum)
Patient ID: [name], male   DOB: 06/30/1932, 82 y.o.   MRN: [id number]  PROGRESS NOTE    Lem J Khim  MRN:[id number] DOB: 01/23/1932 DOA: 08/14/2016  PCP: BURNETT,BRENT A, MD   Brief Narrative:  82 y.o.malewith past medical history significant for emphysematous cholecystitis s/p percutaneous cholecystostomy drain 07/21/2016, PE on Xarelto, hypertension, hyperlipidemia, diabetes mellitus, GERD, depression, OSA not on CPAP, PTSD, chronic back pain, gastric perforation, obesity who presented to [facility] with abdominal pain. Abdominal pain started the morning of the admission, mainly in the right upper quadrant, constant, sharp, 8 out of 10 in intensity and nonradiating. Patient's wife also noted blood in the drain.  In ED, patient was febrile with T max 100.4F, HR 58-101, RR 16-24, oxygen saturation 99% on room air. CT abdomen showed cholecystostomy tube is in good position, coiling within the gallbladder. The mucosal enhancement and adjacent fat stranding suggests continued inflammation and/or infection. Blood work showed lipase of 21, ALP 297, AST 251, ALT 131, total bilirubin 1.3. General surgeon, Dr. Cornett was consulted by EDP.  Assessment & Plan:  Abdominal pain, sepsis in the setting of recent emphysematous cholecystitis s/p perc cholecystostomy drain 07/21/2016 - Sepsis criteria met on admission - CT abdomen showed cholecystostomy tube is in good position, coiling within the gallbladder. The mucosal enhancement and adjacent fat stranding suggests continued inflammation and/or infection - US abdomen showed cholecystostomy within the gallbladder. The gallbladder is only partially distended. There is thickening of the gallbladder wall likely combination of underdistention and a degree of inflammation. No pericholecystic fluid collection. - IR consulted, drain exchanged 08/15/2018 - Continue vancomycin (switched from zyvox) - Blood cx pending as of 12/19  Essential hypertension,  benign - Continue carvedilol 3.125 mg BID and Imdur 30 mg   Closed displaced bimalleolar fracture of right ankle - Has right leg cast on  Morbid obesity due to excess calories ([facility]) - Body mass index is 39.54 kg/m. - Counseled on diet and nutrition  Thrombocytopenia - Monitor platelets closely, drop in platelets likely from heparin but will make switch today to xarelto   Type 2 diabetes without complications with long term insulin use ([facility]) - Continue levemir 20 units at bedtime and SSI  Depression - Continue Seroquel   Dyslipidemia associated with type 2 DM - Continue Zocor 40 mg at bedtime  History of PE - Continue xarelto    DVT prophylaxis: On heparin drip, will switch to xarelto  Code Status: full code  Family Communication: wife at the bedside  Disposition Plan: home likely in next 48 to 72 hours    Consultants:   IR  Procedures:   Perc cholecystostomy drain exchange 12/18  Antimicrobials:   Vanco 08/14/2016 -->     Subjective: No overnight events.  Objective: Vitals:   08/15/16 1454 08/15/16 1507 08/15/16 2113 08/16/16 0418  BP: (!) 112/54 135/63 115/73 121/63  Pulse: 98 96 96 95  Resp: 20 18 20 20  Temp: 98.6 F (37 C) 99.3 F (37.4 C) 98.7 F (37.1 C) 99.4 F (37.4 C)  TempSrc: Oral Oral Oral Oral  SpO2: 95% 96% 95% 95%  Weight:      Height:        Intake/Output Summary (Last 24 hours) at 08/16/16 0817 Last data filed at 08/16/16 0600  Gross per 24 hour  Intake           1501.1 ml  Output              980 ml    Net            521.1 ml   Filed Weights   08/14/16 2033 08/15/16 0400  Weight: 122.5 kg (270 lb) 128.6 kg (283 lb 8.2 oz)    Examination:  General exam: Appears calm and comfortable, no distress  Respiratory system: Clear to auscultation. No wheezing  Cardiovascular system: S1 & S2 heard, RRR. No pedal edema. Gastrointestinal system: Abdomen is softly distended, (+) BS Central nervous system: No focal neurological  deficits. Extremities: Right leg cast, no tenderness  Skin: No rashes, lesions or ulcers Psychiatry: Normal mood and behavior    Data Reviewed: I have personally reviewed following labs and imaging studies  CBC:  Recent Labs Lab 08/14/16 0948 08/14/16 2201 08/15/16 0520 08/16/16 0059  WBC 7.3 8.5 7.3 5.3  NEUTROABS 4.7 5.6  --   --   HGB 12.4* 12.4* 11.0* 10.1*  HCT 39.0 37.8* 33.6* 30.3*  MCV 88.0 85.3 84.8 84.9  PLT 172 178 158 129*   Basic Metabolic Panel:  Recent Labs Lab 08/14/16 0948 08/14/16 2201 08/15/16 0520 08/16/16 0059  NA 139 135 135 138  K 4.4 4.4 4.3 3.8  CL 103 99* 100* 106  CO2 30 30 29 26  GLUCOSE 186* 191* 175* 108*  BUN 26* 24* 23* 14  CREATININE 1.09 1.01 0.93 0.72  CALCIUM 8.8* 8.6* 8.3* 8.2*   GFR: Estimated Creatinine Clearance: 97.3 mL/min (by C-G formula based on SCr of 0.72 mg/dL). Liver Function Tests:  Recent Labs Lab 08/14/16 0948 08/14/16 2201 08/15/16 0520  AST 102* 251* 278*  ALT 43 131* 220*  ALKPHOS 191* 297* 297*  BILITOT 1.1 1.3* 2.2*  PROT 6.5 6.6 5.8*  ALBUMIN 3.4* 3.2* 2.9*    Recent Labs Lab 08/14/16 0948 08/14/16 2201  LIPASE 12 21   No results for input(s): AMMONIA in the last 168 hours. Coagulation Profile:  Recent Labs Lab 08/15/16 0520  INR 1.47   Cardiac Enzymes: No results for input(s): CKTOTAL, CKMB, CKMBINDEX, TROPONINI in the last 168 hours. BNP (last 3 results) No results for input(s): PROBNP in the last 8760 hours. HbA1C: No results for input(s): HGBA1C in the last 72 hours. CBG:  Recent Labs Lab 08/15/16 0736 08/15/16 1309 08/15/16 1709 08/15/16 2116 08/16/16 0736  GLUCAP 174* 128* 149* 105* 90   Lipid Profile: No results for input(s): CHOL, HDL, LDLCALC, TRIG, CHOLHDL, LDLDIRECT in the last 72 hours. Thyroid Function Tests: No results for input(s): TSH, T4TOTAL, FREET4, T3FREE, THYROIDAB in the last 72 hours. Anemia Panel: No results for input(s): VITAMINB12, FOLATE,  FERRITIN, TIBC, IRON, RETICCTPCT in the last 72 hours. Urine analysis:    Component Value Date/Time   COLORURINE AMBER (A) 07/20/2016 1151   APPEARANCEUR CLEAR 07/20/2016 1151   LABSPEC 1.026 07/20/2016 1151   PHURINE 5.5 07/20/2016 1151   GLUCOSEU 250 (A) 07/20/2016 1151   HGBUR NEGATIVE 07/20/2016 1151   BILIRUBINUR SMALL (A) 07/20/2016 1151   KETONESUR 15 (A) 07/20/2016 1151   PROTEINUR 30 (A) 07/20/2016 1151   UROBILINOGEN 1.0 11/22/2012 1633   NITRITE NEGATIVE 07/20/2016 1151   LEUKOCYTESUR NEGATIVE 07/20/2016 1151   Sepsis Labs: @LABRCNTIP(procalcitonin:4,lacticidven:4)   )No results found for this or any previous visit (from the past 240 hour(s)).    Radiology Studies: Ct Abdomen W Contrast Result Date: 08/15/2016 Stable appearance of acute cholecystitis, with percutaneous cholecystostomy tube in appropriate position. No evidence of biliary ductal dilatation. Minimal perihepatic ascites and mild right basilar atelectasis. Aortic atherosclerosis. Electronically Signed     By: John  Stahl M.D.   On: 08/15/2016 13:06   Ct Abdomen Pelvis W Contrast Result Date: 08/14/2016 1. The cholecystostomy tube is in good position, coiling within the gallbladder. The gallbladder is partially decompressed with no distention. However, the mucosal enhancement and adjacent fat stranding suggests continued inflammation and/or infection. Whether the tube with continues to function is a clinical finding. 2. Atherosclerosis in the abdominal aorta. Electronically Signed   By: David  Williams III M.D   On: 08/14/2016 11:12   Ir Catheter Tube Change Result Date: 08/15/2016 Status post routine exchange of 10 French percutaneous cholecystostomy tube. Signed, Jaime S. Wagner, DO [facility] Electronically Signed   By: Jaime  Wagner D.O.   On: 08/15/2016 16:12   Us Abdomen Limited Result Date: 08/15/2016 Cholecystostomy within the gallbladder.  The gallbladder is only partially distended. There is thickening of the gallbladder wall likely combination of underdistention and a degree of inflammation. No pericholecystic fluid collection. Electronically Signed   By: Arash  Radparvar M.D.   On: 08/15/2016 01:56      Scheduled Meds: . carvedilol  3.125 mg Oral BID WC  . cholecalciferol  2,000 Units Oral Daily  . cyanocobalamin  500 mcg Oral BID  . galantamine  8 mg Oral BID WC  . insulin aspart  0-9 Units Subcutaneous TID WC  . insulin detemir  20 Units Subcutaneous QHS  . isosorbide mononitrate  30 mg Oral Daily  . magnesium oxide  400 mg Oral BID  . multivitamin  1 tablet Oral BID  . pantoprazole  40 mg Oral Daily  . polyethylene glycol  17 g Oral Daily  . polyvinyl alcohol  1 drop Both Eyes BID  . QUEtiapine  50 mg Oral QHS  . senna-docusate  2 tablet Oral BID  . simvastatin  40 mg Oral QPM  . sodium chloride flush  3 mL Intravenous Q12H  . tamsulosin  0.4 mg Oral QPC supper  . vancomycin  1,500 mg Intravenous Q24H   Continuous Infusions: . sodium chloride 50 mL/hr at 08/16/16 0203  . heparin 1,400 Units/hr (08/15/16 1656)     LOS: 1 day    Time spent: 25 minutes  Greater than 50% of the time spent on counseling and coordinating the care.   , , MD [facility] Pager [phone]  If 7PM-7AM, please contact night-coverage [email or web address] Password [email or web address] 08/16/2016, 8:17 AM     

## 2016-08-16 NOTE — Progress Notes (Signed)
Patient ID: [name], male   DOB: 03-24-1932, 82 y.o.   MRN: [id number]    Referring Physician(s):  Supervising Physician: Sandi Mariscal  Patient Status:  [facility] - In-pt  Chief Complaint:  Cholecystitis   Subjective: Patient initially presented to [facility] on 11/12 with abdominal pain and confusion.  Patient was found to have emphysematous cholecystitis and sepsis.  Percutaneous cholecystostomy placed Dr. Annamaria Boots by 07/21/16.  Patient discharged to rehab with drain in place and plans to follow-up in our clinic as well as with surgery, however patient admitted yesterday with fever and drain not functioning. Now s/p cholecystomy exchange by Dr. Earleen Newport 08/15/16.  Patient feeling better this AM.  Hungry and wants to eat.   Wife at bedside states his drain had not been flushed in days at home after his release from rehab.   Allergies: Patient has no known allergies.  Medications: Prior to Admission medications   Medication Sig Start Date End Date Taking? Authorizing Provider  albuterol (PROVENTIL) (2.5 MG/3ML) 0.083% nebulizer solution Take 3 mLs (2.5 mg total) by nebulization every 6 (six) hours as needed for wheezing or shortness of breath. 07/27/16  Yes [name], MD  carvedilol (COREG) 3.125 MG tablet Take 3.125 mg by mouth 2 (two) times daily with a meal.   Yes Historical Provider, MD  Cholecalciferol (VITAMIN D3) 2000 units capsule Take 2,000 Units by mouth daily.   Yes Historical Provider, MD  cyanocobalamin 500 MCG tablet Take 500 mcg by mouth 2 (two) times daily.   Yes Historical Provider, MD  galantamine (RAZADYNE ER) 16 MG 24 hr capsule Take 16 mg by mouth daily with breakfast.   Yes Historical Provider, MD  HYDROmorphone (DILAUDID) 2 MG tablet Take one tablet by mouth every morning for pain; Take one tablet by mouth every night at bedtime as needed for moderate to severe pain. Hold for sedation 08/03/16  Yes Gildardo Cranker, DO  Hypromellose (GENTEAL MILD TO MODERATE) 0.3 % SOLN Apply  1 drop to eye 2 (two) times daily. Dry eyes   Yes Historical Provider, MD  insulin detemir (LEVEMIR) 100 UNIT/ML injection Inject 0.3 mLs (30 Units total) into the skin at bedtime. Patient taking differently: Inject 42 Units into the skin at bedtime.  07/27/16  Yes [name], MD  insulin lispro (HUMALOG) 100 UNIT/ML injection Inject 0-5 Units into the skin 3 (three) times daily before meals. 0-200 = 0 units, 201-250 = 2 units, 251-300 = 3 units, 301-350 = 4 units, 351-400 = 5 units, >401 call MD/NP   Yes Historical Provider, MD  isosorbide mononitrate (IMDUR) 30 MG 24 hr tablet Take 30 mg by mouth every morning.    Yes Historical Provider, MD  linezolid (ZYVOX) 600 MG tablet Take 1 tablet (600 mg total) by mouth every 12 (twelve) hours. 07/27/16  Yes [name], MD  magnesium oxide (MAG-OX) 400 MG tablet Take 400 mg by mouth 2 (two) times daily.   Yes Historical Provider, MD  Melatonin 5 MG TABS Take 5 mg by mouth at bedtime.   Yes Historical Provider, MD  Menthol (ICY HOT) 5 % PTCH Apply 1-2 patches topically daily. Apply 2 patches to right shoulder QAM, 1 patch to left knee   Yes Historical Provider, MD  methocarbamol (ROBAXIN) 750 MG tablet Take 1 tablet (750 mg total) by mouth every 6 (six) hours as needed for muscle spasms. 07/27/16  Yes [name], MD  Multiple Vitamins-Minerals (PRESERVISION AREDS 2 PO) Take 1 capsule by mouth 2 (two) times  daily.   Yes Historical Provider, MD  NUTRITIONAL SUPPLEMENT LIQD Take 120 mLs by mouth 2 (two) times daily.   Yes Historical Provider, MD  pantoprazole (PROTONIX) 40 MG tablet Take 1 tablet (40 mg total) by mouth daily. 12/20/12  Yes [name], DO  polyethylene glycol (MIRALAX / GLYCOLAX) packet Take 17 g by mouth daily.   Yes Historical Provider, MD  QUEtiapine (SEROQUEL) 50 MG tablet Take 50 mg by mouth at bedtime.    Yes Historical Provider, MD  sennosides-docusate sodium (SENOKOT-S) 8.6-50 MG tablet Take 2 tablets by mouth 2 (two)  times daily.   Yes Historical Provider, MD  simvastatin (ZOCOR) 40 MG tablet Take 40 mg by mouth every evening.   Yes Historical Provider, MD  tamsulosin (FLOMAX) 0.4 MG CAPS capsule Take 1 capsule (0.4 mg total) by mouth daily after supper. 02/15/16  Yes Barton Dubois, MD  chlorpheniramine-HYDROcodone [facility] ER) 10-8 MG/5ML SUER Take 5 mLs by mouth every 12 (twelve) hours as needed for cough.    Historical Provider, MD  Rivaroxaban 15 & 20 MG TBPK Take as directed on package: Start with one 15mg  tablet by mouth twice a day with food. On Day 22, switch to one 20mg  tablet once a day with food. 07/27/16   [name], MD     Vital Signs: BP 121/63 (BP Location: Right Arm)   Pulse 95   Temp 99.4 F (37.4 C) (Oral)   Resp 20   Ht 5\' 11"  (1.803 m)   Wt 283 lb 8.2 oz (128.6 kg)   SpO2 95%   BMI 39.54 kg/m   Physical Exam  Abdominal:  Soft, no RUQ tenderness, drain in place with bloody output, site c/d/i, flushes easily    Imaging: Ct Abdomen W Contrast  Result Date: 08/15/2016 CLINICAL DATA:  Emphysematous cholecystitis. Status post percutaneous cholecystostomy. Worsening abdominal pain. EXAM: CT ABDOMEN WITH CONTRAST TECHNIQUE: Multidetector CT imaging of the abdomen was performed using the standard protocol following bolus administration of intravenous contrast. CONTRAST:  1104mL ISOVUE-300 IOPAMIDOL (ISOVUE-300) INJECTION 61% COMPARISON:  08/14/2016 FINDINGS: Lower Chest: Mild right basilar atelectasis, without significant change. Hepatobiliary: No masses identified. No evidence of biliary ductal dilatation. Findings of acute cholecystitis again demonstrated with percutaneous cholecystostomy tube in appropriate position. No evidence of pericholecystic fluid or abscess. Minimal perihepatic ascites. Pancreas:  No mass or inflammatory changes. Spleen: Within normal limits in size and appearance. Adrenals/Urinary Tract: No masses identified. Small left lower pole renal cyst  again noted. No evidence of hydronephrosis. Stomach/Bowel: No evidence of obstruction, inflammatory process or abnormal fluid collections. Vascular/Lymphatic: No pathologically enlarged lymph nodes. No abdominal aortic aneurysm. Aortic atherosclerosis. Other:  None. Musculoskeletal:  No suspicious bone lesions identified. IMPRESSION: Stable appearance of acute cholecystitis, with percutaneous cholecystostomy tube in appropriate position. No evidence of biliary ductal dilatation. Minimal perihepatic ascites and mild right basilar atelectasis. Aortic atherosclerosis. Electronically Signed   By: Earle Gell M.D.   On: 08/15/2016 13:06   Ct Abdomen Pelvis W Contrast  Result Date: 08/14/2016 CLINICAL DATA:  Right upper quadrant abdominal pain. Percutaneous draining gallbladder. Output from the drain is decreased. EXAM: CT ABDOMEN AND PELVIS WITH CONTRAST TECHNIQUE: Multidetector CT imaging of the abdomen and pelvis was performed using the standard protocol following bolus administration of intravenous contrast. CONTRAST:  171mL ISOVUE-300 IOPAMIDOL (ISOVUE-300) INJECTION 61% COMPARISON:  July 20, 2016 FINDINGS: Lower chest: Coronary artery calcifications. Mild dependent atelectasis. Hepatobiliary: A cholecystostomy tube is coiled within the gallbladder. The gallbladder is partially decompressed with  no distention. There is enhancement of the gallbladder wall medially and inferiorly and there is continued adjacent fat stranding. Air within the gallbladder may be due to the tube. The liver and portal vein are normal. Pancreas: Unremarkable. No pancreatic ductal dilatation or surrounding inflammatory changes. Spleen: Normal in size without focal abnormality. Adrenals/Urinary Tract: There is a cyst off the lower left kidney. Mild bilateral perinephric stranding is unchanged. No hydronephrosis or acute abnormality. Stomach/Bowel: Colonic diverticulosis without diverticulitis. The colon and appendix are otherwise  normal. The stomach and small bowel are unremarkable. Vascular/Lymphatic: Atherosclerotic change seen in the non aneurysmal aorta, iliac vessels, and femoral vessels. No adenopathy. Reproductive: Prostate is unremarkable. Other: No abdominal wall hernia or abnormality. No abdominopelvic ascites. Musculoskeletal: No acute or significant osseous findings. IMPRESSION: 1. The cholecystostomy tube is in good position, coiling within the gallbladder. The gallbladder is partially decompressed with no distention. However, the mucosal enhancement and adjacent fat stranding suggests continued inflammation and/or infection. Whether the tube with continues to function is a clinical finding. 2. Atherosclerosis in the abdominal aorta. Electronically Signed   By: Dorise Bullion III M.D   On: 08/14/2016 11:12   Ir Catheter Tube Change  Result Date: 08/15/2016 INDICATION: 82 year old male with a history of acute cholecystitis. He has had malfunction of is drain.  He presents for replacement. EXAM: IR CATHETER TUBE CHANGE MEDICATIONS: None ANESTHESIA/SEDATION: None FLUOROSCOPY TIME:  Fluoroscopy Time: 1 minutes 6 seconds (39 mGy). COMPLICATIONS: None PROCEDURE: Informed written consent was obtained from the patient after a thorough discussion of the procedural risks, benefits and alternatives. All questions were addressed. Maximal Sterile Barrier Technique was utilized including caps, mask, sterile gowns, sterile gloves, sterile drape, hand hygiene and skin antiseptic. A timeout was performed prior to the initiation of the procedure. Patient was position in supine position. Routine exchange of 10 French pigtail percutaneous cholecystostomy was performed with modified Seldinger technique. 1% lidocaine was used for local anesthesia and a retention suture was placed. Catheter was attached to gravity drainage. IMPRESSION: Status post routine exchange of 10 French percutaneous cholecystostomy tube. Signed, Dulcy Fanny. Earleen Newport, DO  [facility] Electronically Signed   By: Corrie Mckusick D.O.   On: 08/15/2016 16:12   US Abdomen Limited  Result Date: 08/15/2016 CLINICAL DATA:  82 year old male with right upper quadrant abdominal pain. Cholecystostomy tube in place. EXAM: US ABDOMEN LIMITED - RIGHT UPPER QUADRANT COMPARISON:  Abdominal CT dated 08/14/2016 FINDINGS: Gallbladder: The gallbladder is partially contracted. The gallbladder wall is thickened measuring approximately 13 mm. Linear echogenic structure within the gallbladder corresponds to the cholecystostomy tube. Small gallstones may be present, however difficult to see due to cholecystostomy tube. No significant pericholecystic fluid. Tenderness was elicited over the gallbladder area during scanning. Common bile duct: Diameter: 6 mm Liver: Heterogeneous echotexture with increased echogenicity compatible with fatty infiltration. IMPRESSION: Cholecystostomy within the gallbladder. The gallbladder is only partially distended. There is thickening of the gallbladder wall likely combination of underdistention and a degree of inflammation. No pericholecystic fluid collection. Electronically Signed   By: Anner Crete M.D.   On: 08/15/2016 01:56    Labs:  CBC:  Recent Labs  08/14/16 0948 08/14/16 2201 08/15/16 0520 08/16/16 0059  WBC 7.3 8.5 7.3 5.3  HGB 12.4* 12.4* 11.0* 10.1*  HCT 39.0 37.8* 33.6* 30.3*  PLT 172 178 158 129*    COAGS:  Recent Labs  07/21/16 0100 08/15/16 0520 08/16/16 0059  INR 1.42 1.47  --   APTT  138* 36 66*    BMP:  Recent Labs  08/14/16 0948 08/14/16 2201 08/15/16 0520 08/16/16 0059  NA 139 135 135 138  K 4.4 4.4 4.3 3.8  CL 103 99* 100* 106  CO2 30 30 29 26   GLUCOSE 186* 191* 175* 108*  BUN 26* 24* 23* 14  CALCIUM 8.8* 8.6* 8.3* 8.2*  CREATININE 1.09 1.01 0.93 0.72  GFRNONAA >60 >60 >60 >60  GFRAA >60 >60 >60 >60    LIVER FUNCTION TESTS:  Recent Labs   07/21/16 0100 08/14/16 0948 08/14/16 2201 08/15/16 0520  BILITOT 1.0 1.1 1.3* 2.2*  AST 17 102* 251* 278*  ALT 13* 43 131* 220*  ALKPHOS 71 191* 297* 297*  PROT 6.0* 6.5 6.6 5.8*  ALBUMIN 2.8* 3.4* 3.2* 2.9*    Assessment and Plan: Acute cholecystitis s/p percutaneous cholecystostomy tube exchange 12/18 Patient is improved this AM after tube exchange.  Routine drain care including flushing the tube BID-TID.  Discussed this with patient's wife as well.  Record output. IR to follow.   Electronically Signed: Docia Barrier 08/16/2016, 9:26 AM   I spent a total of 15 Minutes at the the patient's bedside AND on the patient's hospital floor or unit, greater than 50% of which was counseling/coordinating care for cholecystitis s/p percutaneous cholecystostomy drain.

## 2016-08-17 LAB — GLUCOSE, CAPILLARY
GLUCOSE-CAPILLARY: 134 mg/dL — AB (ref 65–99)
GLUCOSE-CAPILLARY: 190 mg/dL — AB (ref 65–99)
Glucose-Capillary: 125 mg/dL — ABNORMAL HIGH (ref 65–99)
Glucose-Capillary: 174 mg/dL — ABNORMAL HIGH (ref 65–99)

## 2016-08-17 LAB — CBC
HEMATOCRIT: 30.6 % — AB (ref 39.0–52.0)
HEMOGLOBIN: 10.1 g/dL — AB (ref 13.0–17.0)
MCH: 27.5 pg (ref 26.0–34.0)
MCHC: 33 g/dL (ref 30.0–36.0)
MCV: 83.4 fL (ref 78.0–100.0)
Platelets: 131 10*3/uL — ABNORMAL LOW (ref 150–400)
RBC: 3.67 MIL/uL — AB (ref 4.22–5.81)
RDW: 14.9 % (ref 11.5–15.5)
WBC: 6.1 10*3/uL (ref 4.0–10.5)

## 2016-08-17 MED ORDER — AMOXICILLIN-POT CLAVULANATE 875-125 MG PO TABS
1.0000 | ORAL_TABLET | Freq: Two times a day (BID) | ORAL | Status: DC
  Administered 2016-08-17 – 2016-08-18 (×3): 1 via ORAL
  Filled 2016-08-17 (×3): qty 1

## 2016-08-17 MED ORDER — HYDROMORPHONE HCL 2 MG PO TABS
2.0000 mg | ORAL_TABLET | Freq: Two times a day (BID) | ORAL | Status: DC
  Administered 2016-08-17 – 2016-08-18 (×3): 2 mg via ORAL
  Filled 2016-08-17 (×3): qty 1

## 2016-08-17 NOTE — Progress Notes (Signed)
Pt has declined use of CPAP while in the hospital.  RT to monitor and assess as needed.

## 2016-08-17 NOTE — Evaluation (Signed)
Physical Therapy Evaluation Patient Details Name: [name] MRN: [id number] DOB: 11-02-1931 Today's Date: 08/17/2016   History of Present Illness  82 y.o. male admitted with cholecystitis, s/p perc cholecysteostomy drain 07/21/16.  recently DC'd from SNF  where he was doing rehab for R ankle fx sustained 06/08/16, he is NWB. PMH of OSA on CPAP, CKD, DM2, neuropathy. Pt is on Xarelto for acute PE.    Clinical Impression  The patient reports that he is moving around in the bed, does not want to transfer into recliner. Reports that he is able to get to power scooter at home. Patient recently [place] from SNF.  Assume NWB on the right foot. Pt admitted with above diagnosis. Pt currently with functional limitations due to the deficits listed below (see PT Problem List).  Pt will benefit from skilled PT to increase their independence and safety with mobility to allow discharge to the venue listed below.       Follow Up Recommendations No PT follow up;Supervision/Assistance - 24 hourpatient declines. He may benefit from HHPT for safety.    Equipment Recommendations  None recommended by PT    Recommendations for Other Services       Precautions / Restrictions Precautions Precautions: Fall Precaution Comments: right IJ drain, right ankle fx Restrictions RLE Weight Bearing: Non weight bearing Other Position/Activity Restrictions: 2* R ankle fx 06/08/16. ankle in hard cast, Has ortho appt  next week      Mobility  Bed Mobility Overal bed mobility: Modified Independent             General bed mobility comments: with rail  Transfers                 General transfer comment: patient declined to  transfer statting the chair was uncomfortable. states that he got up yesterday  Ambulation/Gait                Stairs            Wheelchair Mobility    Modified Rankin (Stroke Patients Only)       Balance Overall balance assessment: History of Falls;Needs  assistance Sitting-balance support: Feet supported;No upper extremity supported Sitting balance-Leahy Scale: Good                                       Pertinent Vitals/Pain Pain Assessment: Faces Faces Pain Scale: Hurts even more Pain Location: back Pain Descriptors / Indicators: Aching Pain Intervention(s): Limited activity within patient's tolerance;Monitored during session    [place] expects to be discharged to:: Private residence Living Arrangements: Spouse/significant other Available Help at Discharge: Family;Available 24 hours/day Type of Home: Apartment Home Access: Level entry     Home Layout: One level Home Equipment: Bedside commode;Cane - single point;Walker - 2 wheels;Walker - 4 wheels;Wheelchair - Education officer, community - power;Tub bench      Prior Function Level of Independence: Independent with assistive device(s)         Comments: pt was independent with WC transfers at home per patient     Hand Dominance        Extremity/Trunk Assessment   Upper Extremity Assessment Upper Extremity Assessment: Overall WFL for tasks assessed    Lower Extremity Assessment Lower Extremity Assessment: RLE deficits/detail;LLE deficits/detail RLE Deficits / Details: ankle in hard cast, can wiggle toes, t,  LLE Deficits / Details: reports the knee is bad  Cervical / Trunk Assessment Cervical / Trunk Assessment: Normal  Communication      Cognition Arousal/Alertness: Awake/alert Behavior During Therapy: WFL for tasks assessed/performed;Restless Overall Cognitive Status: Within Functional Limits for tasks assessed                      General Comments      Exercises     Assessment/Plan    PT Assessment Patient needs continued PT services  PT Problem List Decreased strength;Decreased activity tolerance;Decreased balance;Decreased mobility;Obesity          PT Treatment Interventions DME instruction;Functional  mobility training;Therapeutic activities;Therapeutic exercise;Patient/family education    PT Goals (Current goals can be found in the Care Plan section)  Acute Rehab PT Goals Patient Stated Goal: to go home ASAP PT Goal Formulation: With patient Time For Goal Achievement: 08/31/16 Potential to Achieve Goals: Good    Frequency Min 3X/week   Barriers to discharge        Co-evaluation               End of Session   Activity Tolerance: Patient tolerated treatment well Patient left: in bed;with call bell/phone within reach Nurse Communication: Mobility status         Time: 5009-3818 PT Time Calculation (min) (ACUTE ONLY): 14 min   Charges:   PT Evaluation $PT Eval Low Complexity: 1 Procedure     PT G CodesMarcelino Freestone PT [phone]  08/17/2016, 12:39 PM Tresa Endo PT [phone]

## 2016-08-17 NOTE — Progress Notes (Signed)
Referring Physician(s): Devine,A  Supervising Physician: Daryll Brod  Patient Status:  [facility] - In-pt  Chief Complaint:  cholecystitis  Subjective: Pt doing ok; has some mild RUQ discomfort; denies N/V  Allergies: Patient has no known allergies.  Medications: Prior to Admission medications   Medication Sig Start Date End Date Taking? Authorizing Provider  albuterol (PROVENTIL) (2.5 MG/3ML) 0.083% nebulizer solution Take 3 mLs (2.5 mg total) by nebulization every 6 (six) hours as needed for wheezing or shortness of breath. 07/27/16  Yes [name], MD  carvedilol (COREG) 3.125 MG tablet Take 3.125 mg by mouth 2 (two) times daily with a meal.   Yes Historical Provider, MD  Cholecalciferol (VITAMIN D3) 2000 units capsule Take 2,000 Units by mouth daily.   Yes Historical Provider, MD  cyanocobalamin 500 MCG tablet Take 500 mcg by mouth 2 (two) times daily.   Yes Historical Provider, MD  galantamine (RAZADYNE ER) 16 MG 24 hr capsule Take 16 mg by mouth daily with breakfast.   Yes Historical Provider, MD  HYDROmorphone (DILAUDID) 2 MG tablet Take one tablet by mouth every morning for pain; Take one tablet by mouth every night at bedtime as needed for moderate to severe pain. Hold for sedation 08/03/16  Yes Gildardo Cranker, DO  Hypromellose (GENTEAL MILD TO MODERATE) 0.3 % SOLN Apply 1 drop to eye 2 (two) times daily. Dry eyes   Yes Historical Provider, MD  insulin detemir (LEVEMIR) 100 UNIT/ML injection Inject 0.3 mLs (30 Units total) into the skin at bedtime. Patient taking differently: Inject 42 Units into the skin at bedtime.  07/27/16  Yes [name], MD  insulin lispro (HUMALOG) 100 UNIT/ML injection Inject 0-5 Units into the skin 3 (three) times daily before meals. 0-200 = 0 units, 201-250 = 2 units, 251-300 = 3 units, 301-350 = 4 units, 351-400 = 5 units, >401 call MD/NP   Yes Historical Provider, MD  isosorbide mononitrate (IMDUR) 30 MG 24 hr tablet Take 30 mg by mouth  every morning.    Yes Historical Provider, MD  linezolid (ZYVOX) 600 MG tablet Take 1 tablet (600 mg total) by mouth every 12 (twelve) hours. 07/27/16  Yes [name], MD  magnesium oxide (MAG-OX) 400 MG tablet Take 400 mg by mouth 2 (two) times daily.   Yes Historical Provider, MD  Melatonin 5 MG TABS Take 5 mg by mouth at bedtime.   Yes Historical Provider, MD  Menthol (ICY HOT) 5 % PTCH Apply 1-2 patches topically daily. Apply 2 patches to right shoulder QAM, 1 patch to left knee   Yes Historical Provider, MD  methocarbamol (ROBAXIN) 750 MG tablet Take 1 tablet (750 mg total) by mouth every 6 (six) hours as needed for muscle spasms. 07/27/16  Yes [name], MD  Multiple Vitamins-Minerals (PRESERVISION AREDS 2 PO) Take 1 capsule by mouth 2 (two) times daily.   Yes Historical Provider, MD  NUTRITIONAL SUPPLEMENT LIQD Take 120 mLs by mouth 2 (two) times daily.   Yes Historical Provider, MD  pantoprazole (PROTONIX) 40 MG tablet Take 1 tablet (40 mg total) by mouth daily. 12/20/12  Yes [name], DO  polyethylene glycol (MIRALAX / GLYCOLAX) packet Take 17 g by mouth daily.   Yes Historical Provider, MD  QUEtiapine (SEROQUEL) 50 MG tablet Take 50 mg by mouth at bedtime.    Yes Historical Provider, MD  sennosides-docusate sodium (SENOKOT-S) 8.6-50 MG tablet Take 2 tablets by mouth 2 (two) times daily.   Yes Historical Provider, MD  simvastatin (  ZOCOR) 40 MG tablet Take 40 mg by mouth every evening.   Yes Historical Provider, MD  tamsulosin (FLOMAX) 0.4 MG CAPS capsule Take 1 capsule (0.4 mg total) by mouth daily after supper. 02/15/16  Yes Barton Dubois, MD  chlorpheniramine-HYDROcodone [facility] ER) 10-8 MG/5ML SUER Take 5 mLs by mouth every 12 (twelve) hours as needed for cough.    Historical Provider, MD  Rivaroxaban 15 & 20 MG TBPK Take as directed on package: Start with one 15mg  tablet by mouth twice a day with food. On Day 22, switch to one 20mg  tablet once a day with food.  07/27/16   [name], MD     Vital Signs: BP 134/74 (BP Location: Right Arm)   Pulse 90   Temp 98.2 F (36.8 C) (Oral)   Resp 20   Ht 5\' 11"  (1.803 m)   Wt 283 lb 8.2 oz (128.6 kg)   SpO2 93%   BMI 39.54 kg/m   Physical Exam  RUQ/GB drain intact; insertion site ok, mildly tender, output 20 cc blood-tinged bile; drain irrigated with 5 cc sterile NS  Imaging: Ct Abdomen W Contrast  Result Date: 08/15/2016 CLINICAL DATA:  Emphysematous cholecystitis. Status post percutaneous cholecystostomy. Worsening abdominal pain. EXAM: CT ABDOMEN WITH CONTRAST TECHNIQUE: Multidetector CT imaging of the abdomen was performed using the standard protocol following bolus administration of intravenous contrast. CONTRAST:  178mL ISOVUE-300 IOPAMIDOL (ISOVUE-300) INJECTION 61% COMPARISON:  08/14/2016 FINDINGS: Lower Chest: Mild right basilar atelectasis, without significant change. Hepatobiliary: No masses identified. No evidence of biliary ductal dilatation. Findings of acute cholecystitis again demonstrated with percutaneous cholecystostomy tube in appropriate position. No evidence of pericholecystic fluid or abscess. Minimal perihepatic ascites. Pancreas:  No mass or inflammatory changes. Spleen: Within normal limits in size and appearance. Adrenals/Urinary Tract: No masses identified. Small left lower pole renal cyst again noted. No evidence of hydronephrosis. Stomach/Bowel: No evidence of obstruction, inflammatory process or abnormal fluid collections. Vascular/Lymphatic: No pathologically enlarged lymph nodes. No abdominal aortic aneurysm. Aortic atherosclerosis. Other:  None. Musculoskeletal:  No suspicious bone lesions identified. IMPRESSION: Stable appearance of acute cholecystitis, with percutaneous cholecystostomy tube in appropriate position. No evidence of biliary ductal dilatation. Minimal perihepatic ascites and mild right basilar atelectasis. Aortic atherosclerosis. Electronically Signed   By:  Earle Gell M.D.   On: 08/15/2016 13:06   Ct Abdomen Pelvis W Contrast  Result Date: 08/14/2016 CLINICAL DATA:  Right upper quadrant abdominal pain. Percutaneous draining gallbladder. Output from the drain is decreased. EXAM: CT ABDOMEN AND PELVIS WITH CONTRAST TECHNIQUE: Multidetector CT imaging of the abdomen and pelvis was performed using the standard protocol following bolus administration of intravenous contrast. CONTRAST:  163mL ISOVUE-300 IOPAMIDOL (ISOVUE-300) INJECTION 61% COMPARISON:  July 20, 2016 FINDINGS: Lower chest: Coronary artery calcifications. Mild dependent atelectasis. Hepatobiliary: A cholecystostomy tube is coiled within the gallbladder. The gallbladder is partially decompressed with no distention. There is enhancement of the gallbladder wall medially and inferiorly and there is continued adjacent fat stranding. Air within the gallbladder may be due to the tube. The liver and portal vein are normal. Pancreas: Unremarkable. No pancreatic ductal dilatation or surrounding inflammatory changes. Spleen: Normal in size without focal abnormality. Adrenals/Urinary Tract: There is a cyst off the lower left kidney. Mild bilateral perinephric stranding is unchanged. No hydronephrosis or acute abnormality. Stomach/Bowel: Colonic diverticulosis without diverticulitis. The colon and appendix are otherwise normal. The stomach and small bowel are unremarkable. Vascular/Lymphatic: Atherosclerotic change seen in the non aneurysmal aorta, iliac vessels, and femoral vessels.  No adenopathy. Reproductive: Prostate is unremarkable. Other: No abdominal wall hernia or abnormality. No abdominopelvic ascites. Musculoskeletal: No acute or significant osseous findings. IMPRESSION: 1. The cholecystostomy tube is in good position, coiling within the gallbladder. The gallbladder is partially decompressed with no distention. However, the mucosal enhancement and adjacent fat stranding suggests continued inflammation  and/or infection. Whether the tube with continues to function is a clinical finding. 2. Atherosclerosis in the abdominal aorta. Electronically Signed   By: Dorise Bullion III M.D   On: 08/14/2016 11:12   Ir Catheter Tube Change  Result Date: 08/15/2016 INDICATION: 82 year old male with a history of acute cholecystitis. He has had malfunction of is drain.  He presents for replacement. EXAM: IR CATHETER TUBE CHANGE MEDICATIONS: None ANESTHESIA/SEDATION: None FLUOROSCOPY TIME:  Fluoroscopy Time: 1 minutes 6 seconds (39 mGy). COMPLICATIONS: None PROCEDURE: Informed written consent was obtained from the patient after a thorough discussion of the procedural risks, benefits and alternatives. All questions were addressed. Maximal Sterile Barrier Technique was utilized including caps, mask, sterile gowns, sterile gloves, sterile drape, hand hygiene and skin antiseptic. A timeout was performed prior to the initiation of the procedure. Patient was position in supine position. Routine exchange of 10 French pigtail percutaneous cholecystostomy was performed with modified Seldinger technique. 1% lidocaine was used for local anesthesia and a retention suture was placed. Catheter was attached to gravity drainage. IMPRESSION: Status post routine exchange of 10 French percutaneous cholecystostomy tube. Signed, Dulcy Fanny. Earleen Newport, DO [facility] Radiology Electronically Signed   By: Corrie Mckusick D.O.   On: 08/15/2016 16:12   US Abdomen Limited  Result Date: 08/15/2016 CLINICAL DATA:  82 year old male with right upper quadrant abdominal pain. Cholecystostomy tube in place. EXAM: US ABDOMEN LIMITED - RIGHT UPPER QUADRANT COMPARISON:  Abdominal CT dated 08/14/2016 FINDINGS: Gallbladder: The gallbladder is partially contracted. The gallbladder wall is thickened measuring approximately 13 mm. Linear echogenic structure within the gallbladder corresponds to the cholecystostomy  tube. Small gallstones may be present, however difficult to see due to cholecystostomy tube. No significant pericholecystic fluid. Tenderness was elicited over the gallbladder area during scanning. Common bile duct: Diameter: 6 mm Liver: Heterogeneous echotexture with increased echogenicity compatible with fatty infiltration. IMPRESSION: Cholecystostomy within the gallbladder. The gallbladder is only partially distended. There is thickening of the gallbladder wall likely combination of underdistention and a degree of inflammation. No pericholecystic fluid collection. Electronically Signed   By: Anner Crete M.D.   On: 08/15/2016 01:56    Labs:  CBC:  Recent Labs  08/14/16 2201 08/15/16 0520 08/16/16 0059 08/17/16 0537  WBC 8.5 7.3 5.3 6.1  HGB 12.4* 11.0* 10.1* 10.1*  HCT 37.8* 33.6* 30.3* 30.6*  PLT 178 158 129* 131*    COAGS:  Recent Labs  07/21/16 0100 08/15/16 0520 08/16/16 0059  INR 1.42 1.47  --   APTT 138* 36 66*    BMP:  Recent Labs  08/14/16 0948 08/14/16 2201 08/15/16 0520 08/16/16 0059  NA 139 135 135 138  K 4.4 4.4 4.3 3.8  CL 103 99* 100* 106  CO2 30 30 29 26   GLUCOSE 186* 191* 175* 108*  BUN 26* 24* 23* 14  CALCIUM 8.8* 8.6* 8.3* 8.2*  CREATININE 1.09 1.01 0.93 0.72  GFRNONAA >60 >60 >60 >60  GFRAA >60 >60 >60 >60    LIVER FUNCTION TESTS:  Recent Labs  07/21/16 0100 08/14/16 0948 08/14/16 2201 08/15/16 0520  BILITOT 1.0 1.1 1.3* 2.2*  AST 17 102* 251*  278*  ALT 13* 43 131* 220*  ALKPHOS 71 191* 297* 297*  PROT 6.0* 6.5 6.6 5.8*  ALBUMIN 2.8* 3.4* 3.2* 2.9*    Assessment and Plan: Pt with hx cholecystitis, s/p perc cholecystostomy 07/21/16; recent drain malfunction requiring exchange on 12/18; AF; WBC nl; hgb stable; prior bile cx's with enterococcus/staph; cont with tid drain flushes; will need once daily drain flushes as OP; keep originally scheduled OP appt for drain injection at IR clinic [phone]) on 08/31/16 at 1PM; other  plans as per primary team   Electronically Signed: D. Rowe Robert 08/17/2016, 10:13 AM   I spent a total of 15 minutes at the the patient's bedside AND on the patient's hospital floor or unit, greater than 50% of which was counseling/coordinating care for gallbladder drain    Patient ID: [name], male   DOB: 1932-05-03, 82 y.o.   MRN: [id number]

## 2016-08-17 NOTE — Progress Notes (Signed)
Patient ID: [name], male   DOB: 08/28/1932, 82 y.o.   MRN: [id number]  PROGRESS NOTE    [name]  UXL:[id number] DOB: 1932/07/13 DOA: 08/14/2016  PCP: Stephens Shire, MD   Brief Narrative:  82 y.o.malewith past medical history significant for emphysematous cholecystitis s/p percutaneous cholecystostomy drain 07/21/2016, PE on Xarelto, hypertension, hyperlipidemia, diabetes mellitus, GERD, depression, OSA not on CPAP, PTSD, chronic back pain, gastric perforation, obesity who presented to [facility] with abdominal pain. Abdominal pain started the morning of the admission, mainly in the right upper quadrant, constant, sharp, 8 out of 10 in intensity and nonradiating. Patient's wife also noted blood in the drain.  In ED, patient was febrile with T max 100.77F, HR 58-101, RR 16-24, oxygen saturation 99% on room air. CT abdomen showed cholecystostomy tube is in good position, coiling within the gallbladder. The mucosal enhancement and adjacent fat stranding suggests continued inflammation and/or infection. Blood work showed lipase of 21, ALP 297, AST 251, ALT 131, total bilirubin 1.3. General surgeon, Dr. Brantley Stage was consulted by EDP.  Assessment & Plan:  Abdominal pain, sepsis in the setting of recent emphysematous cholecystitis s/p perc cholecystostomy drain 07/21/2016 - CT abdomen showed cholecystostomy tube is in good position, coiling within the gallbladder. The mucosal enhancement and adjacent fat stranding suggests continued inflammation and/or infection - US abdomen showed cholecystostomy within the gallbladder. The gallbladder is only partially distended. There is thickening of the gallbladder wall likely combination of underdistention and a degree of inflammation. No pericholecystic fluid collection. - IR consulted, pt is s/p drain exchange 08/15/2018 - Continue vancomycin (switched from zyvox) - stop date initially for 12/20. I spoke with ID who recommended Augmentin for 10 more days  on discharge  - Blood cx so far negative   Essential hypertension, benign - Continue carvedilol 3.125 mg BID and Imdur 30 mg   Closed displaced bimalleolar fracture of right ankle - Has right leg cast on  Morbid obesity due to excess calories (HCC) - Body mass index is 39.54 kg/m. - Counseled on diet and nutrition  Thrombocytopenia - Platelets stable at 131  Type 2 diabetes without complications with long term insulin use (HCC) - Continue levemir 20 units at bedtime and SSI - CBG's in past 24 hours: 162, 113, 125  Depression - Continue Seroquel   Dyslipidemia associated with type 2 DM - Continue Zocor 40 mg at bedtime  History of PE - Continue xarelto    DVT prophylaxis: On heparin drip, will switch to xarelto  Code Status: full code  Family Communication: wife at the bedside  Disposition Plan: home likely tomorrow; will need PT eval for safe discharge plan    Consultants:   IR  Procedures:   Perc cholecystostomy drain exchange 12/18  Antimicrobials:   Vanco 08/14/2016 -->     Subjective: No overnight events.  Objective: Vitals:   08/16/16 0418 08/16/16 1402 08/16/16 2139 08/17/16 0445  BP: 121/63 130/76 (!) 146/76 134/74  Pulse: 95 95 99 90  Resp: 20 18 20 20   Temp: 99.4 F (37.4 C) 97.9 F (36.6 C) 100.1 F (37.8 C) 98.2 F (36.8 C)  TempSrc: Oral Oral Oral Oral  SpO2: 95% 94% 97% 93%  Weight:      Height:        Intake/Output Summary (Last 24 hours) at 08/17/16 0824 Last data filed at 08/17/16 5366  Gross per 24 hour  Intake           2779.2 ml  Output  1720 ml  Net           1059.2 ml   Filed Weights   08/14/16 2033 08/15/16 0400  Weight: 122.5 kg (270 lb) 128.6 kg (283 lb 8.2 oz)    Examination:  General exam: Appears calm and comfortable, no distress  Respiratory system: Clear to auscultation. No wheezing  Cardiovascular system: S1 & S2 heard, RRR. No pedal edema. Gastrointestinal system: Abdomen is softly  distended, (+) BS Central nervous system: No focal neurological deficits. Extremities: Right leg cast, no tenderness  Skin: No rashes, lesions or ulcers Psychiatry: Normal mood and behavior    Data Reviewed: I have personally reviewed following labs and imaging studies  CBC:  Recent Labs Lab 08/14/16 0948 08/14/16 2201 08/15/16 0520 08/16/16 0059 08/17/16 0537  WBC 7.3 8.5 7.3 5.3 6.1  NEUTROABS 4.7 5.6  --   --   --   HGB 12.4* 12.4* 11.0* 10.1* 10.1*  HCT 39.0 37.8* 33.6* 30.3* 30.6*  MCV 88.0 85.3 84.8 84.9 83.4  PLT 172 178 158 129* 283*   Basic Metabolic Panel:  Recent Labs Lab 08/14/16 0948 08/14/16 2201 08/15/16 0520 08/16/16 0059  NA 139 135 135 138  K 4.4 4.4 4.3 3.8  CL 103 99* 100* 106  CO2 30 30 29 26   GLUCOSE 186* 191* 175* 108*  BUN 26* 24* 23* 14  CREATININE 1.09 1.01 0.93 0.72  CALCIUM 8.8* 8.6* 8.3* 8.2*   GFR: Estimated Creatinine Clearance: 97.3 mL/min (by C-G formula based on SCr of 0.72 mg/dL). Liver Function Tests:  Recent Labs Lab 08/14/16 0948 08/14/16 2201 08/15/16 0520  AST 102* 251* 278*  ALT 43 131* 220*  ALKPHOS 191* 297* 297*  BILITOT 1.1 1.3* 2.2*  PROT 6.5 6.6 5.8*  ALBUMIN 3.4* 3.2* 2.9*    Recent Labs Lab 08/14/16 0948 08/14/16 2201  LIPASE 12 21   No results for input(s): AMMONIA in the last 168 hours. Coagulation Profile:  Recent Labs Lab 08/15/16 0520  INR 1.47   Cardiac Enzymes: No results for input(s): CKTOTAL, CKMB, CKMBINDEX, TROPONINI in the last 168 hours. BNP (last 3 results) No results for input(s): PROBNP in the last 8760 hours. HbA1C: No results for input(s): HGBA1C in the last 72 hours. CBG:  Recent Labs Lab 08/16/16 0736 08/16/16 1207 08/16/16 1629 08/16/16 2143 08/17/16 0759  GLUCAP 90 141* 162* 113* 125*   Lipid Profile: No results for input(s): CHOL, HDL, LDLCALC, TRIG, CHOLHDL, LDLDIRECT in the last 72 hours. Thyroid Function Tests: No results for input(s): TSH, T4TOTAL,  FREET4, T3FREE, THYROIDAB in the last 72 hours. Anemia Panel: No results for input(s): VITAMINB12, FOLATE, FERRITIN, TIBC, IRON, RETICCTPCT in the last 72 hours. Urine analysis:    Component Value Date/Time   COLORURINE AMBER (A) 07/20/2016 1151   APPEARANCEUR CLEAR 07/20/2016 1151   LABSPEC 1.026 07/20/2016 1151   PHURINE 5.5 07/20/2016 1151   GLUCOSEU 250 (A) 07/20/2016 1151   HGBUR NEGATIVE 07/20/2016 1151   BILIRUBINUR SMALL (A) 07/20/2016 1151   KETONESUR 15 (A) 07/20/2016 1151   PROTEINUR 30 (A) 07/20/2016 1151   UROBILINOGEN 1.0 11/22/2012 1633   NITRITE NEGATIVE 07/20/2016 1151   LEUKOCYTESUR NEGATIVE 07/20/2016 1151   Sepsis Labs: @LABRCNTIP (procalcitonin:4,lacticidven:4)   ) Recent Results (from the past 240 hour(s))  Culture, blood (x 2)     Status: None (Preliminary result)   Collection Time: 08/15/16  5:20 AM  Result Value Ref Range Status   Specimen Description BLOOD RIGHT HAND  Final  Special Requests BOTTLES DRAWN AEROBIC AND ANAEROBIC 5CC EACH  Final   Culture   Final    NO GROWTH 1 DAY Performed at [facility]    Report Status PENDING  Incomplete  Culture, blood (x 2)     Status: None (Preliminary result)   Collection Time: 08/15/16  5:20 AM  Result Value Ref Range Status   Specimen Description BLOOD RIGHT ARM  Final   Special Requests BOTTLES DRAWN AEROBIC AND ANAEROBIC 5CC EACH  Final   Culture   Final    NO GROWTH 1 DAY Performed at [facility]    Report Status PENDING  Incomplete      Radiology Studies: Ct Abdomen W Contrast Result Date: 08/15/2016 Stable appearance of acute cholecystitis, with percutaneous cholecystostomy tube in appropriate position. No evidence of biliary ductal dilatation. Minimal perihepatic ascites and mild right basilar atelectasis. Aortic atherosclerosis. Electronically Signed   By: Earle Gell M.D.   On: 08/15/2016 13:06   Ct Abdomen Pelvis W Contrast Result Date: 08/14/2016 1. The cholecystostomy  tube is in good position, coiling within the gallbladder. The gallbladder is partially decompressed with no distention. However, the mucosal enhancement and adjacent fat stranding suggests continued inflammation and/or infection. Whether the tube with continues to function is a clinical finding. 2. Atherosclerosis in the abdominal aorta. Electronically Signed   By: Dorise Bullion III M.D   On: 08/14/2016 11:12   Ir Catheter Tube Change Result Date: 08/15/2016 Status post routine exchange of 10 French percutaneous cholecystostomy tube. Signed, Dulcy Fanny. Earleen Newport, DO [facility] Radiology Electronically Signed   By: Corrie Mckusick D.O.   On: 08/15/2016 16:12   US Abdomen Limited Result Date: 08/15/2016 Cholecystostomy within the gallbladder. The gallbladder is only partially distended. There is thickening of the gallbladder wall likely combination of underdistention and a degree of inflammation. No pericholecystic fluid collection. Electronically Signed   By: Anner Crete M.D.   On: 08/15/2016 01:56      Scheduled Meds: . carvedilol  3.125 mg Oral BID WC  . cholecalciferol  2,000 Units Oral Daily  . cyanocobalamin  500 mcg Oral BID  . galantamine  8 mg Oral BID WC  . insulin aspart  0-9 Units Subcutaneous TID WC  . insulin detemir  20 Units Subcutaneous QHS  . isosorbide mononitrate  30 mg Oral Daily  . magnesium oxide  400 mg Oral BID  . multivitamin  1 tablet Oral BID  . pantoprazole  40 mg Oral Daily  . polyethylene glycol  17 g Oral Daily  . polyvinyl alcohol  1 drop Both Eyes BID  . QUEtiapine  50 mg Oral QHS  . rivaroxaban  20 mg Oral Daily  . senna-docusate  2 tablet Oral BID  . simvastatin  40 mg Oral QPM  . sodium chloride flush  3 mL Intravenous Q12H  . tamsulosin  0.4 mg Oral QPC supper  . vancomycin  1,500 mg Intravenous Q24H   Continuous Infusions: . sodium chloride 50 mL/hr at 08/16/16 0203     LOS: 2 days    Time  spent: 15 minutes  Greater than 50% of the time spent on counseling and coordinating the care.   [name], MD [facility] Pager [phone]  If 7PM-7AM, please contact night-coverage [email or web address] Password [facility] 08/17/2016, 8:24 AM

## 2016-08-18 DIAGNOSIS — E784 Other hyperlipidemia: Secondary | ICD-10-CM

## 2016-08-18 LAB — CBC
HEMATOCRIT: 30.4 % — AB (ref 39.0–52.0)
HEMOGLOBIN: 10.2 g/dL — AB (ref 13.0–17.0)
MCH: 28.3 pg (ref 26.0–34.0)
MCHC: 33.6 g/dL (ref 30.0–36.0)
MCV: 84.2 fL (ref 78.0–100.0)
Platelets: 147 10*3/uL — ABNORMAL LOW (ref 150–400)
RBC: 3.61 MIL/uL — AB (ref 4.22–5.81)
RDW: 15 % (ref 11.5–15.5)
WBC: 7.1 10*3/uL (ref 4.0–10.5)

## 2016-08-18 LAB — BASIC METABOLIC PANEL
ANION GAP: 5 (ref 5–15)
BUN: 9 mg/dL (ref 6–20)
CALCIUM: 8.3 mg/dL — AB (ref 8.9–10.3)
CO2: 29 mmol/L (ref 22–32)
Chloride: 106 mmol/L (ref 101–111)
Creatinine, Ser: 0.79 mg/dL (ref 0.61–1.24)
GFR calc Af Amer: >60 mL/min (ref >60)
GLUCOSE: 121 mg/dL — AB (ref 65–99)
Potassium: 3.6 mmol/L (ref 3.5–5.1)
SODIUM: 140 mmol/L (ref 135–145)

## 2016-08-18 LAB — GLUCOSE, CAPILLARY: GLUCOSE-CAPILLARY: 101 mg/dL — AB (ref 65–99)

## 2016-08-18 MED ORDER — RIVAROXABAN 20 MG PO TABS: 20.0000 mg | ORAL_TABLET | Freq: Every day | ORAL | 0 refills | Status: DC

## 2016-08-18 MED ORDER — INSULIN DETEMIR 100 UNIT/ML ~~LOC~~ SOLN: 20.0000 [IU] | Freq: Every day | SUBCUTANEOUS | 0 refills | Status: DC

## 2016-08-18 MED ORDER — INSULIN DETEMIR 100 UNIT/ML ~~LOC~~ SOLN: 20.0000 [IU] | Freq: Every day | SUBCUTANEOUS | 11 refills | Status: DC

## 2016-08-18 MED ORDER — AMOXICILLIN-POT CLAVULANATE 875-125 MG PO TABS: 1.0000 | ORAL_TABLET | Freq: Two times a day (BID) | ORAL | 0 refills | Status: DC

## 2016-08-18 NOTE — Care Management Note (Signed)
Case Management Note  Patient Details  Name: [name] MRN: [id number] Date of Birth: 1931-10-15  Subjective/Objective:    82 yo admitted with Abdominal pain.                Action/Plan: From home with spouse. Pt to discharge home with Percutaneous cholecystostomy drain. Drain to be flushed daily with dressing changes daily as well. Staff RN to do teaching with pt and family on drain care and to send pt home with a few flushes and dressings. Pt states he does not need any home health therapy, that all he needs is a HHRN. Pt offered choice for [facility] services and [facility] chosen. [facility] rep contacted for referral. No other DC needs communicated.  Expected Discharge Date:                  Expected Discharge Plan:  [place]  In-House Referral:     Discharge planning Services  CM Consult  Post Acute Care Choice:  Home Health Choice offered to:  Patient, Spouse  DME Arranged:    DME Agency:     HH Arranged:  RN [place] Agency:  [place]  Status of Service:  Completed, signed off  If discussed at [place] of Stay Meetings, dates discussed:    Additional CommentsLynnell Catalan, RN 08/18/2016, 11:05 AM  [phone]

## 2016-08-18 NOTE — Discharge Summary (Addendum)
Physician Discharge Summary  [name] IWP:[id number] DOB: [birth year] DOA: 08/14/2016  PCP: Justin Shire, MD  Admit date: 08/14/2016 Discharge date: 08/18/2016  Recommendations for Outpatient Follow-up:  Augmentin for 9 days on discharge.  Discharge Diagnoses:  Principal Problem:   Abdominal pain Active Problems:   Essential hypertension, benign   Closed displaced bimalleolar fracture of right ankle   Morbid obesity due to excess calories (HCC)   Type 2 diabetes with nephropathy (HCC)   Emphysematous cholecystitis s/p perc cholecystostomy drain 07/21/2016   Cholecystostomy tube dysfunction   PE (physical exam), annual   Depression   GERD (gastroesophageal reflux disease)   HLD (hyperlipidemia)   Sepsis ([name])    Discharge Condition: stable   Diet recommendation: as tolerated   History of present illness:  82 y.o.malewith past medical history significant for emphysematous cholecystitis s/p percutaneouscholecystostomy drain 07/21/2016, PE on Xarelto, hypertension, hyperlipidemia, diabetes mellitus, GERD, depression, OSA not on CPAP, PTSD, chronic back pain, gastric perforation, obesity who presented to [facility] with abdominal pain. Abdominal pain started the morning of the admission, mainly in the right upper quadrant, constant, sharp, 8 out of 10 in intensity and nonradiating. Patient's wife also noted blood in the drain.  In ED, patient was febrile with T max 100.18F, HR 58-101, RR 16-24, oxygen saturation 99% on room air. CT abdomen showed cholecystostomy tube is in good position, coiling within the gallbladder. The mucosal enhancement and adjacent fat stranding suggests continued inflammation and/or infection. Blood work showed lipase of 21, ALP 297, AST 251, ALT 131, total bilirubin 1.3. General surgeon, Dr. Brantley Stage was consulted by EDP.  Hospital Course:   Abdominal pain (RUQ), sepsis in the setting of recent emphysematous cholecystitis s/p perc cholecystostomy drain  07/21/2016 - CT abdomen showed cholecystostomy tube is in good position, coiling within the gallbladder. The mucosal enhancement and adjacent fat stranding suggests continued inflammation and/or infection - US abdomen showed cholecystostomy within the gallbladder. The gallbladder is only partially distended. There is thickening of the gallbladder wall likely combination of underdistention and a degree of inflammation. No pericholecystic fluid collection. - IR consulted, pt is s/p drain exchange 08/15/2018 - Continue vancomycin (switched from zyvox) - stop date initially for 12/20. I spoke with ID who recommended Augmentin for 10 more days, First dose 12/20 so on discharge 9 more days - Blood cx so far negative   Essential hypertension, benign - Continue carvedilol 3.125 mg BID and Imdur 30 mg   Closed displaced bimalleolar fracture of right ankle - Has right leg cast on  Morbid obesity due to excess calories (HCC) - Body mass index is 39.54 kg/m. - Counseled on diet and nutrition  Thrombocytopenia - Platelets stable at 147  Type 2 diabetes without complications with long term insulin use (HCC) - Continue levemir 20 units at bedtime and SSI  Depression - Continue Seroquel   Dyslipidemia associated with type 2 DM - Continue Zocor 40 mg at bedtime  History of PE - Continue xarelto    DVT prophylaxis: On heparin drip, will switch to xarelto  Code Status: full code  Family Communication: wife at the bedside     Consultants:   IR  Procedures:   Perc cholecystostomy drain exchange 12/18  Antimicrobials:   Vanco 08/14/2016 -->  08/17/2016  Augmentin 08/17/2016 --> for 9 days on discharge   Signed:  [name], MD  [facility] 08/18/2016, 10:36 AM  Pager #: [phone]  Time spent in minutes: less than 30 minutes    Discharge Exam:  Vitals:   08/18/16 0500 08/18/16 0837  BP: 130/72 132/73  Pulse: 95 88  Resp: 20   Temp: 98 F (36.7  C)    Vitals:   08/17/16 1413 08/17/16 2206 08/18/16 0500 08/18/16 0837  BP: 137/72 (!) 122/58 130/72 132/73  Pulse: 96 94 95 88  Resp: 18 20 20    Temp: 98.3 F (36.8 C) 98.6 F (37 C) 98 F (36.7 C)   TempSrc: Oral Oral Oral   SpO2: 96% 97% 94%   Weight:      Height:        General: Pt is alert, follows commands appropriately, not in acute distress Cardiovascular: Regular rate and rhythm, S1/S2 + Respiratory: Clear to auscultation bilaterally, no wheezing, no crackles, no rhonchi Abdominal: Obese, soft, bowel sounds +, no guarding, RUQ drain in place Extremities: no edema, no cyanosis, pulses palpable bilaterally DP and PT; cast on right leg Neuro: Grossly nonfocal  Discharge Instructions  Discharge Instructions    Call MD for:  persistant nausea and vomiting    Complete by:  As directed    Call MD for:  redness, tenderness, or signs of infection (pain, swelling, redness, odor or green/yellow discharge around incision site)    Complete by:  As directed    Call MD for:  severe uncontrolled pain    Complete by:  As directed    Diet - low sodium heart healthy    Complete by:  As directed    Discharge instructions    Complete by:  As directed    Augmentin for 9 days on discharge.   Increase activity slowly    Complete by:  As directed      Allergies as of 08/18/2016   No Known Allergies     Medication List    STOP taking these medications   linezolid 600 MG tablet Commonly known as:  ZYVOX   Rivaroxaban 15 & 20 MG Tbpk Replaced by:  rivaroxaban 20 MG Tabs tablet     TAKE these medications   albuterol (2.5 MG/3ML) 0.083% nebulizer solution Commonly known as:  PROVENTIL Take 3 mLs (2.5 mg total) by nebulization every 6 (six) hours as needed for wheezing or shortness of breath.   amoxicillin-clavulanate 875-125 MG tablet Commonly known as:  AUGMENTIN Take 1 tablet by mouth every 12 (twelve) hours.   carvedilol 3.125 MG tablet Commonly known as:   COREG Take 3.125 mg by mouth 2 (two) times daily with a meal.   cyanocobalamin 500 MCG tablet Take 500 mcg by mouth 2 (two) times daily.   galantamine 16 MG 24 hr capsule Commonly known as:  RAZADYNE ER Take 16 mg by mouth daily with breakfast.   GENTEAL MILD TO MODERATE 0.3 % Soln Generic drug:  Hypromellose Apply 1 drop to eye 2 (two) times daily. Dry eyes   HUMALOG 100 UNIT/ML injection Generic drug:  insulin lispro Inject 0-5 Units into the skin 3 (three) times daily before meals. 0-200 = 0 units, 201-250 = 2 units, 251-300 = 3 units, 301-350 = 4 units, 351-400 = 5 units, >401 call MD/NP   HYDROmorphone 2 MG tablet Commonly known as:  DILAUDID Take one tablet by mouth every morning for pain; Take one tablet by mouth every night at bedtime as needed for moderate to severe pain. Hold for sedation   ICY HOT 5 % Ptch Generic drug:  Menthol Apply 1-2 patches topically daily. Apply 2 patches to right shoulder QAM, 1 patch to left knee  insulin detemir 100 UNIT/ML injection Commonly known as:  LEVEMIR Inject 0.2 mLs (20 Units total) into the skin at bedtime. What changed:  how much to take   insulin detemir 100 UNIT/ML injection Commonly known as:  LEVEMIR Inject 0.2 mLs (20 Units total) into the skin at bedtime. What changed:  You were already taking a medication with the same name, and this prescription was added. Make sure you understand how and when to take each.   isosorbide mononitrate 30 MG 24 hr tablet Commonly known as:  IMDUR Take 30 mg by mouth every morning.   magnesium oxide 400 MG tablet Commonly known as:  MAG-OX Take 400 mg by mouth 2 (two) times daily.   Melatonin 5 MG Tabs Take 5 mg by mouth at bedtime.   methocarbamol 750 MG tablet Commonly known as:  ROBAXIN Take 1 tablet (750 mg total) by mouth every 6 (six) hours as needed for muscle spasms.   NUTRITIONAL SUPPLEMENT Liqd Take 120 mLs by mouth 2 (two) times daily.   pantoprazole 40 MG  tablet Commonly known as:  PROTONIX Take 1 tablet (40 mg total) by mouth daily.   polyethylene glycol packet Commonly known as:  MIRALAX / GLYCOLAX Take 17 g by mouth daily.   PRESERVISION AREDS 2 PO Take 1 capsule by mouth 2 (two) times daily.   QUEtiapine 50 MG tablet Commonly known as:  SEROQUEL Take 50 mg by mouth at bedtime.   rivaroxaban 20 MG Tabs tablet Commonly known as:  XARELTO Take 1 tablet (20 mg total) by mouth daily. Start taking on:  08/19/2016 Replaces:  Rivaroxaban 15 & 20 MG Tbpk   sennosides-docusate sodium 8.6-50 MG tablet Commonly known as:  SENOKOT-S Take 2 tablets by mouth 2 (two) times daily.   simvastatin 40 MG tablet Commonly known as:  ZOCOR Take 40 mg by mouth every evening.   tamsulosin 0.4 MG Caps capsule Commonly known as:  FLOMAX Take 1 capsule (0.4 mg total) by mouth daily after supper.   TUSSIONEX PENNKINETIC ER 10-8 MG/5ML Suer Generic drug:  chlorpheniramine-HYDROcodone Take 5 mLs by mouth every 12 (twelve) hours as needed for cough.   Vitamin D3 2000 units capsule Take 2,000 Units by mouth daily.      Follow-up Information    BURNETT,BRENT A, MD. Schedule an appointment as soon as possible for a visit in 1 week(s).   Specialty:  Family Medicine Contact information: [address] [place] [address] [phone]            The results of significant diagnostics from this hospitalization (including imaging, microbiology, ancillary and laboratory) are listed below for reference.    Significant Diagnostic Studies: Dg Chest 2 View  Result Date: 07/20/2016 CLINICAL DATA:  Cough. EXAM: CHEST  2 VIEW COMPARISON:  06/05/2016. FINDINGS: Mediastinum and hilar structures are normal. Heart size normal. Low lung volumes with bibasilar atelectasis. Bibasilar pneumonia cannot be excluded. Small left pleural effusion. No pneumothorax. IMPRESSION: Low lung volumes with bibasilar atelectasis. Bibasilar pneumonia  cannot be excluded. Small left pleural effusion. Electronically Signed   By: Marcello Moores  Register   On: 07/20/2016 12:34   Ct Head Wo Contrast  Result Date: 07/20/2016 CLINICAL DATA:  Altered mental status today.  Initial encounter. EXAM: CT HEAD WITHOUT CONTRAST TECHNIQUE: Contiguous axial images were obtained from the base of the skull through the vertex without intravenous contrast. COMPARISON:  None. FINDINGS: Brain: There is some chronic microvascular ischemic change. No evidence of acute abnormality  including infarct, hemorrhage, mass lesion, mass effect, midline shift or abnormal extra-axial fluid collection. No hydrocephalus or pneumocephalus. Vascular: Atherosclerosis noted. Skull: Intact. Sinuses/Orbits: Negative. Other: None. IMPRESSION: No acute abnormality. Electronically Signed   By: Inge Rise M.D.   On: 07/20/2016 14:53   Ct Angio Chest Pe W And/or Wo Contrast  Result Date: 07/20/2016 CLINICAL DATA:  Recent ankle fracture, hospitalized, cast on RIGHT leg, today not breathing RIGHT, altered mental status, abdominal pain since yesterday, history diabetes mellitus, hypertension, stage III chronic kidney disease EXAM: CT ANGIOGRAPHY CHEST CT ABDOMEN AND PELVIS WITH CONTRAST TECHNIQUE: Multidetector CT imaging of the chest was performed using the standard protocol during bolus administration of intravenous contrast. Multiplanar CT image reconstructions and MIPs were obtained to evaluate the vascular anatomy. Multidetector CT imaging of the abdomen and pelvis was performed using the standard protocol during bolus administration of intravenous contrast. CONTRAST:  80 mL of Isovue 370 IV.  No oral contrast. COMPARISON:  CTA chest 02/11/2016, CT abdomen pelvis 11/17/2012 FINDINGS: CTA CHEST FINDINGS Cardiovascular: Atherosclerotic calcifications aorta and coronary arteries. Aorta normal caliber without aneurysm or dissection. Dilated central pulmonary arteries question pulmonary arterial  hypertension. Single small filling defect identified in within the LEFT lower lobe pulmonary artery at its bifurcation compatible with a small age-indeterminate pulmonary embolus. No additional pulmonary emboli identified. No pericardial effusion. Mediastinum/Nodes: Base of cervical region unremarkable. No definite esophageal abnormalities. No thoracic adenopathy. Lungs/Pleura: Subsegmental atelectasis in BILATERAL lower lobes greater on RIGHT. No pulmonary infiltrate, pleural effusion, or pneumothorax. Musculoskeletal: Unremarkable Review of the MIP images confirms the above findings. CT ABDOMEN and PELVIS FINDINGS Hepatobiliary: Normal appearing liver. No biliary dilatation or biliary air. Thickened gallbladder wall with extensive pericholecystic infiltrative changes. Air within gallbladder lumen both at the fundus and at the neck/cystic duct extending to adjacent to the duodenal wall. Additional foci of gas are seen at the lower gallbladder segment, question intraluminal versus within wall. This could either represent emphysematous cholecystitis or prior erosion of a gallstone from the gallbladder neck into the duodenum. No gallstone identified within bowel nor evidence of gallstone ileus. Pancreas: Atrophic pancreas without mass Spleen: Normal appearance Adrenals/Urinary Tract: Tiny exophytic cyst at the inferomedial LEFT kidney 1.6 x 1.3 cm image 52. Adrenal glands, kidneys, and ureters otherwise normal appearance. Bladder decompressed. Unremarkable prostate gland. Stomach/Bowel: Mild edema adjacent to the wall of the second portion of the duodenum extending to the proximal third duodenum with only minimal wall thickening; this is felt to be secondary to the adjacent gallbladder process rather than a primary duodenal process. Normal appendix. Scattered distal colonic diverticulosis. Stomach and bowel loops otherwise normal appearance. Vascular/Lymphatic: Atherosclerotic calcifications aorta and iliac arteries,  including at the proximal SMA and celiac artery as well as the renal artery origins. No significant adenopathy Reproductive: N/A Other: No free air or free fluid. Small BILATERAL inguinal hernias containing fat. Musculoskeletal: Osseous structures unremarkable. Review of the MIP images confirms the above findings. IMPRESSION: Filling defect at the bifurcation of the LEFT lower lobe pulmonary artery consistent with a small age-indeterminate pulmonary embolus. Subsegmental atelectasis in BILATERAL lower lobes. Distended gallbladder with thickened wall, significant pericholecystic infiltrative changes, air within the gallbladder, and question minimal gas within the gallbladder wall. These findings could represent emphysematous cholecystitis or could be related to prior erosion of a gallstone from the gallbladder neck to the duodenum. Aortic atherosclerosis and mild coronary arterial calcification. BILATERAL inguinal hernias containing fat. Findings called to Dr. Dayna Barker On 07/20/2016 at 1533 hours. Electronically Signed  By: Lavonia Dana M.D.   On: 07/20/2016 15:34   Ct Abdomen W Contrast  Result Date: 08/15/2016 CLINICAL DATA:  Emphysematous cholecystitis. Status post percutaneous cholecystostomy. Worsening abdominal pain. EXAM: CT ABDOMEN WITH CONTRAST TECHNIQUE: Multidetector CT imaging of the abdomen was performed using the standard protocol following bolus administration of intravenous contrast. CONTRAST:  123mL ISOVUE-300 IOPAMIDOL (ISOVUE-300) INJECTION 61% COMPARISON:  08/14/2016 FINDINGS: Lower Chest: Mild right basilar atelectasis, without significant change. Hepatobiliary: No masses identified. No evidence of biliary ductal dilatation. Findings of acute cholecystitis again demonstrated with percutaneous cholecystostomy tube in appropriate position. No evidence of pericholecystic fluid or abscess. Minimal perihepatic ascites. Pancreas:  No mass or inflammatory changes. Spleen: Within normal limits in size  and appearance. Adrenals/Urinary Tract: No masses identified. Small left lower pole renal cyst again noted. No evidence of hydronephrosis. Stomach/Bowel: No evidence of obstruction, inflammatory process or abnormal fluid collections. Vascular/Lymphatic: No pathologically enlarged lymph nodes. No abdominal aortic aneurysm. Aortic atherosclerosis. Other:  None. Musculoskeletal:  No suspicious bone lesions identified. IMPRESSION: Stable appearance of acute cholecystitis, with percutaneous cholecystostomy tube in appropriate position. No evidence of biliary ductal dilatation. Minimal perihepatic ascites and mild right basilar atelectasis. Aortic atherosclerosis. Electronically Signed   By: Earle Gell M.D.   On: 08/15/2016 13:06   Ct Abdomen Pelvis W Contrast  Result Date: 08/14/2016 CLINICAL DATA:  Right upper quadrant abdominal pain. Percutaneous draining gallbladder. Output from the drain is decreased. EXAM: CT ABDOMEN AND PELVIS WITH CONTRAST TECHNIQUE: Multidetector CT imaging of the abdomen and pelvis was performed using the standard protocol following bolus administration of intravenous contrast. CONTRAST:  152mL ISOVUE-300 IOPAMIDOL (ISOVUE-300) INJECTION 61% COMPARISON:  July 20, 2016 FINDINGS: Lower chest: Coronary artery calcifications. Mild dependent atelectasis. Hepatobiliary: A cholecystostomy tube is coiled within the gallbladder. The gallbladder is partially decompressed with no distention. There is enhancement of the gallbladder wall medially and inferiorly and there is continued adjacent fat stranding. Air within the gallbladder may be due to the tube. The liver and portal vein are normal. Pancreas: Unremarkable. No pancreatic ductal dilatation or surrounding inflammatory changes. Spleen: Normal in size without focal abnormality. Adrenals/Urinary Tract: There is a cyst off the lower left kidney. Mild bilateral perinephric stranding is unchanged. No hydronephrosis or acute abnormality.  Stomach/Bowel: Colonic diverticulosis without diverticulitis. The colon and appendix are otherwise normal. The stomach and small bowel are unremarkable. Vascular/Lymphatic: Atherosclerotic change seen in the non aneurysmal aorta, iliac vessels, and femoral vessels. No adenopathy. Reproductive: Prostate is unremarkable. Other: No abdominal wall hernia or abnormality. No abdominopelvic ascites. Musculoskeletal: No acute or significant osseous findings. IMPRESSION: 1. The cholecystostomy tube is in good position, coiling within the gallbladder. The gallbladder is partially decompressed with no distention. However, the mucosal enhancement and adjacent fat stranding suggests continued inflammation and/or infection. Whether the tube with continues to function is a clinical finding. 2. Atherosclerosis in the abdominal aorta. Electronically Signed   By: Dorise Bullion III M.D   On: 08/14/2016 11:12   Ct Abdomen Pelvis W Contrast  Result Date: 07/20/2016 CLINICAL DATA:  Recent ankle fracture, hospitalized, cast on RIGHT leg, today not breathing RIGHT, altered mental status, abdominal pain since yesterday, history diabetes mellitus, hypertension, stage III chronic kidney disease EXAM: CT ANGIOGRAPHY CHEST CT ABDOMEN AND PELVIS WITH CONTRAST TECHNIQUE: Multidetector CT imaging of the chest was performed using the standard protocol during bolus administration of intravenous contrast. Multiplanar CT image reconstructions and MIPs were obtained to evaluate the vascular anatomy. Multidetector CT imaging of the abdomen  and pelvis was performed using the standard protocol during bolus administration of intravenous contrast. CONTRAST:  80 mL of Isovue 370 IV.  No oral contrast. COMPARISON:  CTA chest 02/11/2016, CT abdomen pelvis 11/17/2012 FINDINGS: CTA CHEST FINDINGS Cardiovascular: Atherosclerotic calcifications aorta and coronary arteries. Aorta normal caliber without aneurysm or dissection. Dilated central pulmonary  arteries question pulmonary arterial hypertension. Single small filling defect identified in within the LEFT lower lobe pulmonary artery at its bifurcation compatible with a small age-indeterminate pulmonary embolus. No additional pulmonary emboli identified. No pericardial effusion. Mediastinum/Nodes: Base of cervical region unremarkable. No definite esophageal abnormalities. No thoracic adenopathy. Lungs/Pleura: Subsegmental atelectasis in BILATERAL lower lobes greater on RIGHT. No pulmonary infiltrate, pleural effusion, or pneumothorax. Musculoskeletal: Unremarkable Review of the MIP images confirms the above findings. CT ABDOMEN and PELVIS FINDINGS Hepatobiliary: Normal appearing liver. No biliary dilatation or biliary air. Thickened gallbladder wall with extensive pericholecystic infiltrative changes. Air within gallbladder lumen both at the fundus and at the neck/cystic duct extending to adjacent to the duodenal wall. Additional foci of gas are seen at the lower gallbladder segment, question intraluminal versus within wall. This could either represent emphysematous cholecystitis or prior erosion of a gallstone from the gallbladder neck into the duodenum. No gallstone identified within bowel nor evidence of gallstone ileus. Pancreas: Atrophic pancreas without mass Spleen: Normal appearance Adrenals/Urinary Tract: Tiny exophytic cyst at the inferomedial LEFT kidney 1.6 x 1.3 cm image 52. Adrenal glands, kidneys, and ureters otherwise normal appearance. Bladder decompressed. Unremarkable prostate gland. Stomach/Bowel: Mild edema adjacent to the wall of the second portion of the duodenum extending to the proximal third duodenum with only minimal wall thickening; this is felt to be secondary to the adjacent gallbladder process rather than a primary duodenal process. Normal appendix. Scattered distal colonic diverticulosis. Stomach and bowel loops otherwise normal appearance. Vascular/Lymphatic: Atherosclerotic  calcifications aorta and iliac arteries, including at the proximal SMA and celiac artery as well as the renal artery origins. No significant adenopathy Reproductive: N/A Other: No free air or free fluid. Small BILATERAL inguinal hernias containing fat. Musculoskeletal: Osseous structures unremarkable. Review of the MIP images confirms the above findings. IMPRESSION: Filling defect at the bifurcation of the LEFT lower lobe pulmonary artery consistent with a small age-indeterminate pulmonary embolus. Subsegmental atelectasis in BILATERAL lower lobes. Distended gallbladder with thickened wall, significant pericholecystic infiltrative changes, air within the gallbladder, and question minimal gas within the gallbladder wall. These findings could represent emphysematous cholecystitis or could be related to prior erosion of a gallstone from the gallbladder neck to the duodenum. Aortic atherosclerosis and mild coronary arterial calcification. BILATERAL inguinal hernias containing fat. Findings called to Dr. Dayna Barker On 07/20/2016 at 1533 hours. Electronically Signed   By: Lavonia Dana M.D.   On: 07/20/2016 15:34   Ir Catheter Tube Change  Result Date: 08/15/2016 INDICATION: 82 year old male with a history of acute cholecystitis. He has had malfunction of is drain.  He presents for replacement. EXAM: IR CATHETER TUBE CHANGE MEDICATIONS: None ANESTHESIA/SEDATION: None FLUOROSCOPY TIME:  Fluoroscopy Time: 1 minutes 6 seconds (39 mGy). COMPLICATIONS: None PROCEDURE: Informed written consent was obtained from the patient after a thorough discussion of the procedural risks, benefits and alternatives. All questions were addressed. Maximal Sterile Barrier Technique was utilized including caps, mask, sterile gowns, sterile gloves, sterile drape, hand hygiene and skin antiseptic. A timeout was performed prior to the initiation of the procedure. Patient was position in supine position. Routine exchange of 10 French pigtail  percutaneous cholecystostomy was performed with  modified Seldinger technique. 1% lidocaine was used for local anesthesia and a retention suture was placed. Catheter was attached to gravity drainage. IMPRESSION: Status post routine exchange of 10 French percutaneous cholecystostomy tube. Signed, Dulcy Fanny. Earleen Newport, DO [facility] Radiology Electronically Signed   By: Corrie Mckusick D.O.   On: 08/15/2016 16:12   US Abdomen Limited  Result Date: 08/15/2016 CLINICAL DATA:  82 year old male with right upper quadrant abdominal pain. Cholecystostomy tube in place. EXAM: US ABDOMEN LIMITED - RIGHT UPPER QUADRANT COMPARISON:  Abdominal CT dated 08/14/2016 FINDINGS: Gallbladder: The gallbladder is partially contracted. The gallbladder wall is thickened measuring approximately 13 mm. Linear echogenic structure within the gallbladder corresponds to the cholecystostomy tube. Small gallstones may be present, however difficult to see due to cholecystostomy tube. No significant pericholecystic fluid. Tenderness was elicited over the gallbladder area during scanning. Common bile duct: Diameter: 6 mm Liver: Heterogeneous echotexture with increased echogenicity compatible with fatty infiltration. IMPRESSION: Cholecystostomy within the gallbladder. The gallbladder is only partially distended. There is thickening of the gallbladder wall likely combination of underdistention and a degree of inflammation. No pericholecystic fluid collection. Electronically Signed   By: Anner Crete M.D.   On: 08/15/2016 01:56   Ir Perc Cholecystostomy  Result Date: 07/21/2016 INDICATION: ACUTE CHOLECYSTITIS EXAM: CHOLECYSTOSTOMY MEDICATIONS: ZOSYN 3.375 G; The antibiotic was administered within an appropriate time frame prior to the initiation of the procedure. ANESTHESIA/SEDATION: Moderate (conscious) sedation was employed during this procedure. A total of Versed 2.0 mg and Fentanyl 50 mcg was  administered intravenously. Moderate Sedation Time: 15 minutes. The patient's level of consciousness and vital signs were monitored continuously by radiology nursing throughout the procedure under my direct supervision. FLUOROSCOPY TIME:  Fluoroscopy Time:  36 seconds (36 mGy). COMPLICATIONS: None immediate. PROCEDURE: Informed written consent was obtained from the patient after a thorough discussion of the procedural risks, benefits and alternatives. All questions were addressed. Maximal Sterile Barrier Technique was utilized including caps, mask, sterile gowns, sterile gloves, sterile drape, hand hygiene and skin antiseptic. A timeout was performed prior to the initiation of the procedure. Previous imaging reviewed. Preliminary ultrasound performed. Gallbladder localized in the right upper quadrant in the mid axillary line through a lower intercostal space. Under sterile conditions and local anesthesia, ultrasound percutaneous transhepatic needle access performed of the distended gallbladder. Needle position confirmed with ultrasound. There was return of bile. Guidewire inserted followed by Accustick dilator set. Amplatz guidewire advanced into the gallbladder. Tract dilatation performed to insert a 10 [place] drain. Retention loop formed in the gallbladder. Position confirmed with fluoroscopy. Images obtained for documentation. Catheter secured with a Prolene suture and sterile dressing. Catheter connected to external gravity drainage bag. IMPRESSION: Successful ultrasound and fluoroscopic 10 French percutaneous cholecystostomy as above. Electronically Signed   By: Jerilynn Mages.  Shick M.D.   On: 07/21/2016 11:33   Dg Chest Port 1 View  Result Date: 07/22/2016 CLINICAL DATA:  Cough and wheezing EXAM: PORTABLE CHEST 1 VIEW COMPARISON:  Chest radiograph and chest CT July 20, 2016 FINDINGS: There is new patchy airspace consolidation in the right lower lobe. Lungs elsewhere are clear. Heart is borderline enlarged with  pulmonary vascularity within normal limits. No adenopathy. There is degenerative change in each shoulder. There is atherosclerotic calcification in the aorta. IMPRESSION: Patchy airspace disease consistent with a degree of pneumonia in the right upper lobe. Lungs elsewhere clear. Heart borderline enlarged. Aortic atherosclerosis. Electronically Signed   By: Lowella Grip III M.D.   On: 07/22/2016  10:49   Dg Ankle Right Port  Result Date: 07/25/2016 CLINICAL DATA:  Ankle fracture 1 month ago.  Follow-up. EXAM: PORTABLE RIGHT ANKLE - 2 VIEW COMPARISON:  06/04/2016.  CT 06/06/2016. FINDINGS: Two screws are noted in the posterior calcaneus. Plate and screw fixation device in the distal fibula. No hardware complicating feature. Slightly displaced medial malleolar fracture again noted with fracture line remaining evident. IMPRESSION: Internal fixation of the distal fibular fracture and calcaneal fracture posteriorly. Slightly displaced medial malleolar fracture again noted with fracture line remaining evident. Electronically Signed   By: Rolm Baptise M.D.   On: 07/25/2016 12:54   Dg Abd Portable 1v  Result Date: 07/26/2016 CLINICAL DATA:  Abdominal distention for 3 days EXAM: PORTABLE ABDOMEN - 1 VIEW COMPARISON:  CT abdomen and pelvis July 20, 2016 FINDINGS: There is moderate stool in the colon. There is no bowel dilatation or air-fluid level suggesting bowel obstruction. No free air. Drain noted in right abdomen. Elevation of right hemidiaphragm present. IMPRESSION: Moderate stool in colon. No bowel obstruction or free air evident. Drain in right abdomen present. Electronically Signed   By: Lowella Grip III M.D.   On: 07/26/2016 14:49    Microbiology: Recent Results (from the past 240 hour(s))  Culture, blood (x 2)     Status: None (Preliminary result)   Collection Time: 08/15/16  5:20 AM  Result Value Ref Range Status   Specimen Description BLOOD RIGHT HAND  Final   Special Requests  BOTTLES DRAWN AEROBIC AND ANAEROBIC 5CC EACH  Final   Culture   Final    NO GROWTH 2 DAYS Performed at [facility]    Report Status PENDING  Incomplete  Culture, blood (x 2)     Status: None (Preliminary result)   Collection Time: 08/15/16  5:20 AM  Result Value Ref Range Status   Specimen Description BLOOD RIGHT ARM  Final   Special Requests BOTTLES DRAWN AEROBIC AND ANAEROBIC 5CC EACH  Final   Culture   Final    NO GROWTH 2 DAYS Performed at [facility]    Report Status PENDING  Incomplete     Labs: Basic Metabolic Panel:  Recent Labs Lab 08/14/16 0948 08/14/16 2201 08/15/16 0520 08/16/16 0059 08/18/16 0512  NA 139 135 135 138 140  K 4.4 4.4 4.3 3.8 3.6  CL 103 99* 100* 106 106  CO2 30 30 29 26 29   GLUCOSE 186* 191* 175* 108* 121*  BUN 26* 24* 23* 14 9  CREATININE 1.09 1.01 0.93 0.72 0.79  CALCIUM 8.8* 8.6* 8.3* 8.2* 8.3*   Liver Function Tests:  Recent Labs Lab 08/14/16 0948 08/14/16 2201 08/15/16 0520  AST 102* 251* 278*  ALT 43 131* 220*  ALKPHOS 191* 297* 297*  BILITOT 1.1 1.3* 2.2*  PROT 6.5 6.6 5.8*  ALBUMIN 3.4* 3.2* 2.9*    Recent Labs Lab 08/14/16 0948 08/14/16 2201  LIPASE 12 21   No results for input(s): AMMONIA in the last 168 hours. CBC:  Recent Labs Lab 08/14/16 0948 08/14/16 2201 08/15/16 0520 08/16/16 0059 08/17/16 0537 08/18/16 0512  WBC 7.3 8.5 7.3 5.3 6.1 7.1  NEUTROABS 4.7 5.6  --   --   --   --   HGB 12.4* 12.4* 11.0* 10.1* 10.1* 10.2*  HCT 39.0 37.8* 33.6* 30.3* 30.6* 30.4*  MCV 88.0 85.3 84.8 84.9 83.4 84.2  PLT 172 178 158 129* 131* 147*   Cardiac Enzymes: No results for input(s): CKTOTAL, CKMB, CKMBINDEX, TROPONINI in  the last 168 hours. BNP: BNP (last 3 results)  Recent Labs  02/11/16 2033 07/20/16 1126  BNP 131.7* 137.6*    ProBNP (last 3 results) No results for input(s): PROBNP in the last 8760 hours.  CBG:  Recent Labs Lab 08/17/16 0759 08/17/16 1132 08/17/16 1652  08/17/16 2203 08/18/16 0749  GLUCAP 125* 134* 190* 174* 101*

## 2016-08-18 NOTE — Progress Notes (Signed)
PATIENT DOES NOT HAVE A PCP. PATIENT WILL CALL AND GET A NEW PROVIDER.

## 2016-08-18 NOTE — Discharge Instructions (Signed)
Amoxicillin; Clavulanic Acid extended-release tablets What is this medicine? AMOXICILLIN; CLAVULANIC ACID (a mox i SILL in; KLAV yoo lan ic AS id) is a penicillin antibiotic. It is used to treat certain kinds of bacterial infections. It will not work for colds, flu, or other viral infections. This medicine may be used for other purposes; ask your health care provider or pharmacist if you have questions. COMMON BRAND NAME(S): Augmentin XR What should I tell my health care provider before I take this medicine? They need to know if you have any of these conditions: -bowel disease, like colitis -kidney disease -liver disease -mononucleosis -an unusual or allergic reaction to amoxicillin, penicillin, cephalosporin, other antibiotics, clavulanic acid, other medicines, foods, dyes, or preservatives -pregnant or trying to get pregnant -breast-feeding How should I use this medicine? Take this medicine by mouth with a full glass of water. Follow the directions on the prescription label. Take at the start of a meal. Do not crush or chew. You may cut this medicine in half at the score line for easier swallowing. Take your medicine at regular intervals. Do not take your medicine more often than directed. Take all of your medicine as directed even if you think you are better. Do not skip doses or stop your medicine early. Contact your pediatrician or health care professional regarding the use of this medicine in children. This medicine has been used in children as young as 18 years of age. Overdosage: If you think you have taken too much of this medicine contact a poison control center or emergency room at once. NOTE: This medicine is only for you. Do not share this medicine with others. What if I miss a dose? If you miss a dose, take it as soon as you can. If it is almost time for your next dose, take only that dose. Do not take double or extra doses. What may interact with this  medicine? -allopurinol -anticoagulants -birth control pills -methotrexate -probenecid This list may not describe all possible interactions. Give your health care provider a list of all the medicines, herbs, non-prescription drugs, or dietary supplements you use. Also tell them if you smoke, drink alcohol, or use illegal drugs. Some items may interact with your medicine. What should I watch for while using this medicine? Tell your doctor or health care professional if your symptoms do not improve. Do not treat diarrhea with over the counter products. Contact your doctor if you have diarrhea that lasts more than 2 days or if it is severe and watery. If you have diabetes, you may get a false-positive result for sugar in your urine. Check with your doctor or health care professional. Birth control pills may not work properly while you are taking this medicine. Talk to your doctor about using an extra method of birth control. What side effects may I notice from receiving this medicine? Side effects that you should report to your doctor or health care professional as soon as possible: -allergic reactions like skin rash, itching or hives, swelling of the face, lips, or tongue -breathing problems -dark urine -fever or chills, sore throat -redness, blistering, peeling or loosening of the skin, including inside the mouth -seizures -trouble passing urine or change in the amount of urine -unusual bleeding, bruising -unusually weak or tired -white patches or sores in the mouth or throat Side effects that usually do not require medical attention (report to your doctor or health care professional if they continue or are bothersome): -diarrhea -dizziness -headache -nausea, vomiting -stomach  upset -vaginal or anal irritation This list may not describe all possible side effects. Call your doctor for medical advice about side effects. You may report side effects to FDA at 1-800-FDA-1088. Where should I  keep my medicine? Keep out of the reach of children. Store at room temperature below 25 degrees C (77 degrees F). Keep container tightly closed. Throw away any unused medicine after the expiration date. NOTE: This sheet is a summary. It may not cover all possible information. If you have questions about this medicine, talk to your doctor, pharmacist, or health care provider.  2017 [employer] Standard (2007-11-06 14:32:45)

## 2016-08-19 ENCOUNTER — Emergency Department (HOSPITAL_COMMUNITY)
Admission: EM | Admit: 2016-08-19 | Discharge: 2016-08-20 | Disposition: A | Discharge status: Home / Self Care | Payer: Medicare Other | Attending: Emergency Medicine | Admitting: Emergency Medicine

## 2016-08-19 ENCOUNTER — Encounter (HOSPITAL_COMMUNITY): Payer: Self-pay | Admitting: Emergency Medicine

## 2016-08-19 ENCOUNTER — Emergency Department (HOSPITAL_COMMUNITY): Payer: Medicare Other

## 2016-08-19 DIAGNOSIS — I509 Heart failure, unspecified: Secondary | ICD-10-CM | POA: Diagnosis not present

## 2016-08-19 DIAGNOSIS — E1121 Type 2 diabetes mellitus with diabetic nephropathy: Secondary | ICD-10-CM | POA: Diagnosis not present

## 2016-08-19 DIAGNOSIS — Z794 Long term (current) use of insulin: Secondary | ICD-10-CM | POA: Diagnosis not present

## 2016-08-19 DIAGNOSIS — S82841D Displaced bimalleolar fracture of right lower leg, subsequent encounter for closed fracture with routine healing: Secondary | ICD-10-CM | POA: Diagnosis not present

## 2016-08-19 DIAGNOSIS — E1122 Type 2 diabetes mellitus with diabetic chronic kidney disease: Secondary | ICD-10-CM | POA: Insufficient documentation

## 2016-08-19 DIAGNOSIS — N183 Chronic kidney disease, stage 3 (moderate): Secondary | ICD-10-CM | POA: Diagnosis not present

## 2016-08-19 DIAGNOSIS — K219 Gastro-esophageal reflux disease without esophagitis: Secondary | ICD-10-CM | POA: Diagnosis not present

## 2016-08-19 DIAGNOSIS — I110 Hypertensive heart disease with heart failure: Secondary | ICD-10-CM | POA: Diagnosis not present

## 2016-08-19 DIAGNOSIS — F4310 Post-traumatic stress disorder, unspecified: Secondary | ICD-10-CM | POA: Diagnosis not present

## 2016-08-19 DIAGNOSIS — K8001 Calculus of gallbladder with acute cholecystitis with obstruction: Secondary | ICD-10-CM | POA: Diagnosis not present

## 2016-08-19 DIAGNOSIS — R1011 Right upper quadrant pain: Secondary | ICD-10-CM | POA: Diagnosis not present

## 2016-08-19 DIAGNOSIS — G4733 Obstructive sleep apnea (adult) (pediatric): Secondary | ICD-10-CM | POA: Diagnosis not present

## 2016-08-19 DIAGNOSIS — Z4682 Encounter for fitting and adjustment of non-vascular catheter: Secondary | ICD-10-CM | POA: Diagnosis not present

## 2016-08-19 DIAGNOSIS — C9000 Multiple myeloma not having achieved remission: Secondary | ICD-10-CM | POA: Diagnosis not present

## 2016-08-19 DIAGNOSIS — I129 Hypertensive chronic kidney disease with stage 1 through stage 4 chronic kidney disease, or unspecified chronic kidney disease: Secondary | ICD-10-CM | POA: Diagnosis not present

## 2016-08-19 DIAGNOSIS — Z86711 Personal history of pulmonary embolism: Secondary | ICD-10-CM | POA: Diagnosis not present

## 2016-08-19 DIAGNOSIS — F329 Major depressive disorder, single episode, unspecified: Secondary | ICD-10-CM | POA: Diagnosis not present

## 2016-08-19 DIAGNOSIS — E119 Type 2 diabetes mellitus without complications: Secondary | ICD-10-CM | POA: Diagnosis not present

## 2016-08-19 LAB — COMPREHENSIVE METABOLIC PANEL
ALBUMIN: 3.1 g/dL — AB (ref 3.5–5.0)
ALT: 176 U/L — AB (ref 17–63)
AST: 124 U/L — AB (ref 15–41)
Alkaline Phosphatase: 384 U/L — ABNORMAL HIGH (ref 38–126)
Anion gap: 8 (ref 5–15)
BUN: 11 mg/dL (ref 6–20)
CHLORIDE: 101 mmol/L (ref 101–111)
CO2: 30 mmol/L (ref 22–32)
CREATININE: 0.77 mg/dL (ref 0.61–1.24)
Calcium: 8.6 mg/dL — ABNORMAL LOW (ref 8.9–10.3)
GFR calc Af Amer: >60 mL/min (ref >60)
GFR calc non Af Amer: >60 mL/min (ref >60)
GLUCOSE: 177 mg/dL — AB (ref 65–99)
Potassium: 3.8 mmol/L (ref 3.5–5.1)
SODIUM: 139 mmol/L (ref 135–145)
Total Bilirubin: 1.9 mg/dL — ABNORMAL HIGH (ref 0.3–1.2)
Total Protein: 6.5 g/dL (ref 6.5–8.1)

## 2016-08-19 LAB — URINALYSIS, ROUTINE W REFLEX MICROSCOPIC
Bilirubin Urine: NEGATIVE
GLUCOSE, UA: NEGATIVE mg/dL
HGB URINE DIPSTICK: NEGATIVE
Ketones, ur: NEGATIVE mg/dL
Leukocytes, UA: NEGATIVE
Nitrite: NEGATIVE
PROTEIN: NEGATIVE mg/dL
Specific Gravity, Urine: 1.018 (ref 1.005–1.030)
pH: 5 (ref 5.0–8.0)

## 2016-08-19 LAB — CBC
HCT: 34.2 % — ABNORMAL LOW (ref 39.0–52.0)
Hemoglobin: 11.4 g/dL — ABNORMAL LOW (ref 13.0–17.0)
MCH: 28 pg (ref 26.0–34.0)
MCHC: 33.3 g/dL (ref 30.0–36.0)
MCV: 84 fL (ref 78.0–100.0)
PLATELETS: 203 10*3/uL (ref 150–400)
RBC: 4.07 MIL/uL — AB (ref 4.22–5.81)
RDW: 15.2 % (ref 11.5–15.5)
WBC: 13 10*3/uL — ABNORMAL HIGH (ref 4.0–10.5)

## 2016-08-19 LAB — LIPASE, BLOOD: Lipase: 16 U/L (ref 11–51)

## 2016-08-19 MED ORDER — HYDROMORPHONE HCL 2 MG/ML IJ SOLN
1.0000 mg | Freq: Once | INTRAMUSCULAR | Status: AC
  Administered 2016-08-19: 1 mg via INTRAVENOUS
  Filled 2016-08-19: qty 1

## 2016-08-19 MED ORDER — IOPAMIDOL (ISOVUE-300) INJECTION 61%
INTRAVENOUS | Status: AC
  Filled 2016-08-19: qty 100

## 2016-08-19 MED ORDER — IOPAMIDOL (ISOVUE-300) INJECTION 61%
100.0000 mL | Freq: Once | INTRAVENOUS | Status: AC | PRN
  Administered 2016-08-19: 100 mL via INTRAVENOUS

## 2016-08-19 NOTE — Progress Notes (Signed)
Patient recently discharged from th hospital on 12/21 with percutaneous cholecystostomy drain. Home health services for visiting RN arranged with  [facility].
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
## 2016-08-19 NOTE — ED Notes (Signed)
Bed: WA01 Expected date:  Expected time:  Means of arrival:  Comments: No bed

## 2016-08-19 NOTE — ED Triage Notes (Signed)
Pt reports increased RUQ pain since this afternoon. Occurred after home health nurse left after adjusting his gallbladder drainage tube. No blood in drainage bag, just having usual drainage.

## 2016-08-20 LAB — CULTURE, BLOOD (ROUTINE X 2)
CULTURE: NO GROWTH
CULTURE: NO GROWTH

## 2016-08-20 NOTE — Discharge Instructions (Signed)
1. Medications: oxycodone (already at home) for severe pain, usual home medications 2. Treatment: rest, drink plenty of fluids,  3. Follow Up: Please followup with your primary doctor in 2-3 days for discussion of your diagnoses and further evaluation after today's visit; if you do not have a primary care doctor use the resource guide provided to find one; Please return to the ER for return of pain, vomiting, dysuria, fevers, diarrhea or other concerns.

## 2016-08-23 DIAGNOSIS — Z4682 Encounter for fitting and adjustment of non-vascular catheter: Secondary | ICD-10-CM | POA: Diagnosis not present

## 2016-08-23 DIAGNOSIS — I509 Heart failure, unspecified: Secondary | ICD-10-CM | POA: Diagnosis not present

## 2016-08-23 DIAGNOSIS — I110 Hypertensive heart disease with heart failure: Secondary | ICD-10-CM | POA: Diagnosis not present

## 2016-08-23 DIAGNOSIS — S82841D Displaced bimalleolar fracture of right lower leg, subsequent encounter for closed fracture with routine healing: Secondary | ICD-10-CM | POA: Diagnosis not present

## 2016-08-23 DIAGNOSIS — E119 Type 2 diabetes mellitus without complications: Secondary | ICD-10-CM | POA: Diagnosis not present

## 2016-08-23 DIAGNOSIS — K8001 Calculus of gallbladder with acute cholecystitis with obstruction: Secondary | ICD-10-CM | POA: Diagnosis not present

## 2016-08-24 ENCOUNTER — Other Ambulatory Visit (HOSPITAL_COMMUNITY): Payer: Self-pay | Admitting: General Surgery

## 2016-08-24 ENCOUNTER — Telehealth: Payer: Self-pay | Admitting: Radiology

## 2016-08-24 DIAGNOSIS — I110 Hypertensive heart disease with heart failure: Secondary | ICD-10-CM | POA: Diagnosis not present

## 2016-08-24 DIAGNOSIS — I509 Heart failure, unspecified: Secondary | ICD-10-CM | POA: Diagnosis not present

## 2016-08-24 DIAGNOSIS — Z4682 Encounter for fitting and adjustment of non-vascular catheter: Secondary | ICD-10-CM | POA: Diagnosis not present

## 2016-08-24 DIAGNOSIS — E119 Type 2 diabetes mellitus without complications: Secondary | ICD-10-CM | POA: Diagnosis not present

## 2016-08-24 DIAGNOSIS — S82841D Displaced bimalleolar fracture of right lower leg, subsequent encounter for closed fracture with routine healing: Secondary | ICD-10-CM | POA: Diagnosis not present

## 2016-08-24 DIAGNOSIS — K819 Cholecystitis, unspecified: Secondary | ICD-10-CM

## 2016-08-24 DIAGNOSIS — K8001 Calculus of gallbladder with acute cholecystitis with obstruction: Secondary | ICD-10-CM | POA: Diagnosis not present

## 2016-08-24 NOTE — Telephone Encounter (Signed)
Patient called re:  Abscess drainage catheter.  States that at yesterday's visit thehome health nurse was unable to flush drain itself, with leakage around insertion site.  Insertion site looks good.  Afebrile.  Currently taking antibiotic.  Denies pain.    Saverio Danker, PA-C notified.  Patient scheduled for fluoro drain injection with possible drainage catheter exchange on 08/25/2016 at [facility].    Patient notified with appointment information.    Maykel Reitter [place], RN 08/24/2016 1:00 PM

## 2016-08-25 ENCOUNTER — Other Ambulatory Visit (HOSPITAL_COMMUNITY): Payer: Self-pay | Admitting: General Surgery

## 2016-08-25 ENCOUNTER — Encounter (HOSPITAL_COMMUNITY): Payer: Self-pay | Admitting: Interventional Radiology

## 2016-08-25 ENCOUNTER — Ambulatory Visit (HOSPITAL_COMMUNITY)
Admission: RE | Admit: 2016-08-25 | Discharge: 2016-08-25 | Disposition: A | Discharge status: Home / Self Care | Payer: Medicare Other | Source: Ambulatory Visit | Attending: General Surgery | Admitting: General Surgery

## 2016-08-25 ENCOUNTER — Other Ambulatory Visit: Payer: Self-pay | Admitting: Student

## 2016-08-25 DIAGNOSIS — K811 Chronic cholecystitis: Secondary | ICD-10-CM | POA: Diagnosis not present

## 2016-08-25 DIAGNOSIS — Z4803 Encounter for change or removal of drains: Secondary | ICD-10-CM | POA: Diagnosis not present

## 2016-08-25 DIAGNOSIS — K810 Acute cholecystitis: Secondary | ICD-10-CM | POA: Diagnosis not present

## 2016-08-25 DIAGNOSIS — K819 Cholecystitis, unspecified: Secondary | ICD-10-CM

## 2016-08-25 HISTORY — PX: IR GENERIC HISTORICAL: IMG1180011

## 2016-08-25 MED ORDER — IOPAMIDOL (ISOVUE-300) INJECTION 61%
INTRAVENOUS | Status: AC
  Administered 2016-08-25: 10 mL
  Filled 2016-08-25: qty 50

## 2016-08-25 NOTE — Procedures (Signed)
Interventional Radiology Procedure Note   Patient presents with complaint of "no drainage" from the tube.   Procedure: Injection of cholecystostomy tube.  Findings: Pigtail in the GB.  Patent cystic duct, CHD, CBD, with contrast traversing the ampulla.    .  Complications: None  Recommendations:  - Continue current care. - Encouraged him to observe appt with Dr. Redmond Pulling on 09/03/2015.   - Will not need an interval check before his appointment with Dr. Redmond Pulling. - Do not submerge  - To gravity.    Signed,  Dulcy Fanny. Earleen Newport, DO

## 2016-08-27 DIAGNOSIS — S82841D Displaced bimalleolar fracture of right lower leg, subsequent encounter for closed fracture with routine healing: Secondary | ICD-10-CM | POA: Diagnosis not present

## 2016-08-27 DIAGNOSIS — Z4682 Encounter for fitting and adjustment of non-vascular catheter: Secondary | ICD-10-CM | POA: Diagnosis not present

## 2016-08-27 DIAGNOSIS — I110 Hypertensive heart disease with heart failure: Secondary | ICD-10-CM | POA: Diagnosis not present

## 2016-08-27 DIAGNOSIS — I509 Heart failure, unspecified: Secondary | ICD-10-CM | POA: Diagnosis not present

## 2016-08-27 DIAGNOSIS — K8001 Calculus of gallbladder with acute cholecystitis with obstruction: Secondary | ICD-10-CM | POA: Diagnosis not present

## 2016-08-27 DIAGNOSIS — E119 Type 2 diabetes mellitus without complications: Secondary | ICD-10-CM | POA: Diagnosis not present

## 2016-08-31 ENCOUNTER — Other Ambulatory Visit: Payer: Medicare Other

## 2016-08-31 DIAGNOSIS — I509 Heart failure, unspecified: Secondary | ICD-10-CM | POA: Diagnosis not present

## 2016-08-31 DIAGNOSIS — I110 Hypertensive heart disease with heart failure: Secondary | ICD-10-CM | POA: Diagnosis not present

## 2016-08-31 DIAGNOSIS — K8001 Calculus of gallbladder with acute cholecystitis with obstruction: Secondary | ICD-10-CM | POA: Diagnosis not present

## 2016-08-31 DIAGNOSIS — E119 Type 2 diabetes mellitus without complications: Secondary | ICD-10-CM | POA: Diagnosis not present

## 2016-08-31 DIAGNOSIS — S82841D Displaced bimalleolar fracture of right lower leg, subsequent encounter for closed fracture with routine healing: Secondary | ICD-10-CM | POA: Diagnosis not present

## 2016-08-31 DIAGNOSIS — Z4682 Encounter for fitting and adjustment of non-vascular catheter: Secondary | ICD-10-CM | POA: Diagnosis not present

## 2016-09-02 ENCOUNTER — Other Ambulatory Visit: Payer: Self-pay | Admitting: *Deleted

## 2016-09-02 ENCOUNTER — Other Ambulatory Visit (HOSPITAL_COMMUNITY): Payer: Self-pay | Admitting: General Surgery

## 2016-09-02 DIAGNOSIS — I110 Hypertensive heart disease with heart failure: Secondary | ICD-10-CM | POA: Diagnosis not present

## 2016-09-02 DIAGNOSIS — N2889 Other specified disorders of kidney and ureter: Secondary | ICD-10-CM

## 2016-09-02 DIAGNOSIS — I509 Heart failure, unspecified: Secondary | ICD-10-CM | POA: Diagnosis not present

## 2016-09-02 DIAGNOSIS — K819 Cholecystitis, unspecified: Secondary | ICD-10-CM | POA: Diagnosis not present

## 2016-09-02 DIAGNOSIS — Z434 Encounter for attention to other artificial openings of digestive tract: Secondary | ICD-10-CM | POA: Diagnosis not present

## 2016-09-02 DIAGNOSIS — Z4682 Encounter for fitting and adjustment of non-vascular catheter: Secondary | ICD-10-CM | POA: Diagnosis not present

## 2016-09-02 DIAGNOSIS — E119 Type 2 diabetes mellitus without complications: Secondary | ICD-10-CM | POA: Diagnosis not present

## 2016-09-02 DIAGNOSIS — K8001 Calculus of gallbladder with acute cholecystitis with obstruction: Secondary | ICD-10-CM | POA: Diagnosis not present

## 2016-09-02 DIAGNOSIS — S82841D Displaced bimalleolar fracture of right lower leg, subsequent encounter for closed fracture with routine healing: Secondary | ICD-10-CM | POA: Diagnosis not present

## 2016-09-05 ENCOUNTER — Encounter (HOSPITAL_COMMUNITY): Payer: Self-pay | Admitting: Interventional Radiology

## 2016-09-05 ENCOUNTER — Ambulatory Visit (HOSPITAL_COMMUNITY)
Admission: RE | Admit: 2016-09-05 | Discharge: 2016-09-05 | Disposition: A | Discharge status: Home / Self Care | Payer: Medicare Other | Source: Ambulatory Visit | Attending: General Surgery | Admitting: General Surgery

## 2016-09-05 ENCOUNTER — Other Ambulatory Visit (HOSPITAL_COMMUNITY): Payer: Self-pay | Admitting: General Surgery

## 2016-09-05 DIAGNOSIS — Z434 Encounter for attention to other artificial openings of digestive tract: Secondary | ICD-10-CM | POA: Diagnosis not present

## 2016-09-05 DIAGNOSIS — K819 Cholecystitis, unspecified: Secondary | ICD-10-CM

## 2016-09-05 DIAGNOSIS — Z4803 Encounter for change or removal of drains: Secondary | ICD-10-CM | POA: Diagnosis not present

## 2016-09-05 DIAGNOSIS — K810 Acute cholecystitis: Secondary | ICD-10-CM | POA: Insufficient documentation

## 2016-09-05 HISTORY — PX: IR GENERIC HISTORICAL: IMG1180011

## 2016-09-05 MED ORDER — LIDOCAINE HCL (PF) 1 % IJ SOLN
INTRAMUSCULAR | Status: AC
  Filled 2016-09-05: qty 30

## 2016-09-05 MED ORDER — IOPAMIDOL (ISOVUE-300) INJECTION 61%
INTRAVENOUS | Status: AC
  Administered 2016-09-05: 10 mL
  Filled 2016-09-05: qty 50

## 2016-09-05 MED ORDER — LIDOCAINE HCL 1 % IJ SOLN
INTRAMUSCULAR | Status: DC | PRN
Start: 2016-09-05 — End: 2016-09-06
  Administered 2016-09-05: 10 mL

## 2016-09-05 NOTE — Procedures (Signed)
10 Cholecystostomy exchange Cystic duct patent No comp/EBL

## 2016-09-06 DIAGNOSIS — E119 Type 2 diabetes mellitus without complications: Secondary | ICD-10-CM | POA: Diagnosis not present

## 2016-09-06 DIAGNOSIS — K8001 Calculus of gallbladder with acute cholecystitis with obstruction: Secondary | ICD-10-CM | POA: Diagnosis not present

## 2016-09-06 DIAGNOSIS — I110 Hypertensive heart disease with heart failure: Secondary | ICD-10-CM | POA: Diagnosis not present

## 2016-09-06 DIAGNOSIS — I509 Heart failure, unspecified: Secondary | ICD-10-CM | POA: Diagnosis not present

## 2016-09-06 DIAGNOSIS — Z4682 Encounter for fitting and adjustment of non-vascular catheter: Secondary | ICD-10-CM | POA: Diagnosis not present

## 2016-09-06 DIAGNOSIS — S82841D Displaced bimalleolar fracture of right lower leg, subsequent encounter for closed fracture with routine healing: Secondary | ICD-10-CM | POA: Diagnosis not present

## 2016-09-09 ENCOUNTER — Encounter: Payer: Self-pay | Admitting: Family Medicine

## 2016-09-09 ENCOUNTER — Encounter: Payer: Self-pay | Admitting: General Practice

## 2016-09-09 ENCOUNTER — Ambulatory Visit (INDEPENDENT_AMBULATORY_CARE_PROVIDER_SITE_OTHER): Payer: Medicare Other | Admitting: Family Medicine

## 2016-09-09 VITALS — BP 130/81 | HR 82 | Temp 98.5°F | Resp 16 | Ht 71.0 in | Wt 281.5 lb

## 2016-09-09 DIAGNOSIS — I2699 Other pulmonary embolism without acute cor pulmonale: Secondary | ICD-10-CM

## 2016-09-09 DIAGNOSIS — F4312 Post-traumatic stress disorder, chronic: Secondary | ICD-10-CM | POA: Diagnosis not present

## 2016-09-09 DIAGNOSIS — I10 Essential (primary) hypertension: Secondary | ICD-10-CM | POA: Diagnosis not present

## 2016-09-09 DIAGNOSIS — G894 Chronic pain syndrome: Secondary | ICD-10-CM | POA: Diagnosis not present

## 2016-09-09 DIAGNOSIS — K8001 Calculus of gallbladder with acute cholecystitis with obstruction: Secondary | ICD-10-CM | POA: Diagnosis not present

## 2016-09-09 DIAGNOSIS — E1121 Type 2 diabetes mellitus with diabetic nephropathy: Secondary | ICD-10-CM

## 2016-09-09 DIAGNOSIS — I509 Heart failure, unspecified: Secondary | ICD-10-CM | POA: Diagnosis not present

## 2016-09-09 DIAGNOSIS — E7849 Other hyperlipidemia: Secondary | ICD-10-CM

## 2016-09-09 DIAGNOSIS — Z4682 Encounter for fitting and adjustment of non-vascular catheter: Secondary | ICD-10-CM | POA: Diagnosis not present

## 2016-09-09 DIAGNOSIS — Z23 Encounter for immunization: Secondary | ICD-10-CM

## 2016-09-09 DIAGNOSIS — E784 Other hyperlipidemia: Secondary | ICD-10-CM

## 2016-09-09 DIAGNOSIS — S82841D Displaced bimalleolar fracture of right lower leg, subsequent encounter for closed fracture with routine healing: Secondary | ICD-10-CM | POA: Diagnosis not present

## 2016-09-09 DIAGNOSIS — E119 Type 2 diabetes mellitus without complications: Secondary | ICD-10-CM | POA: Diagnosis not present

## 2016-09-09 DIAGNOSIS — I110 Hypertensive heart disease with heart failure: Secondary | ICD-10-CM | POA: Diagnosis not present

## 2016-09-09 LAB — CBC WITH DIFFERENTIAL/PLATELET
BASOS ABS: 0.1 10*3/uL (ref 0.0–0.1)
BASOS PCT: 0.5 % (ref 0.0–3.0)
EOS ABS: 0.3 10*3/uL (ref 0.0–0.7)
Eosinophils Relative: 3.1 % (ref 0.0–5.0)
HCT: 38.3 % — ABNORMAL LOW (ref 39.0–52.0)
Hemoglobin: 12.7 g/dL — ABNORMAL LOW (ref 13.0–17.0)
LYMPHS ABS: 2.9 10*3/uL (ref 0.7–4.0)
Lymphocytes Relative: 25.9 % (ref 12.0–46.0)
MCHC: 33.2 g/dL (ref 30.0–36.0)
MCV: 82.7 fl (ref 78.0–100.0)
Monocytes Absolute: 1.2 10*3/uL — ABNORMAL HIGH (ref 0.1–1.0)
Monocytes Relative: 10.7 % (ref 3.0–12.0)
NEUTROS ABS: 6.7 10*3/uL (ref 1.4–7.7)
NEUTROS PCT: 59.8 % (ref 43.0–77.0)
PLATELETS: 302 10*3/uL (ref 150.0–400.0)
RBC: 4.63 Mil/uL (ref 4.22–5.81)
RDW: 17.7 % — AB (ref 11.5–15.5)
WBC: 11.1 10*3/uL — ABNORMAL HIGH (ref 4.0–10.5)

## 2016-09-09 LAB — HEPATIC FUNCTION PANEL
ALBUMIN: 3.7 g/dL (ref 3.5–5.2)
ALK PHOS: 114 U/L (ref 39–117)
ALT: 10 U/L (ref 0–53)
AST: 15 U/L (ref 0–37)
Bilirubin, Direct: 0.2 mg/dL (ref 0.0–0.3)
TOTAL PROTEIN: 6.5 g/dL (ref 6.0–8.3)
Total Bilirubin: 0.6 mg/dL (ref 0.2–1.2)

## 2016-09-09 LAB — LIPID PANEL
CHOLESTEROL: 169 mg/dL (ref 0–200)
HDL: 44.8 mg/dL (ref >39.00)
LDL CALC: 101 mg/dL — AB (ref 0–99)
NonHDL: 124.66
TRIGLYCERIDES: 118 mg/dL (ref 0.0–149.0)
Total CHOL/HDL Ratio: 4
VLDL: 23.6 mg/dL (ref 0.0–40.0)

## 2016-09-09 LAB — BASIC METABOLIC PANEL
BUN: 21 mg/dL (ref 6–23)
CHLORIDE: 105 meq/L (ref 96–112)
CO2: 30 meq/L (ref 19–32)
Calcium: 9.2 mg/dL (ref 8.4–10.5)
Creatinine, Ser: 1.29 mg/dL (ref 0.40–1.50)
GFR: 56.63 mL/min — ABNORMAL LOW (ref >60.00)
GLUCOSE: 114 mg/dL — AB (ref 70–99)
Potassium: 4.6 mEq/L (ref 3.5–5.1)
Sodium: 142 mEq/L (ref 135–145)

## 2016-09-09 LAB — MICROALBUMIN / CREATININE URINE RATIO
CREATININE, U: 148.1 mg/dL
MICROALB/CREAT RATIO: 0.5 mg/g (ref 0.0–30.0)
Microalb, Ur: <0.7 mg/dL (ref 0.0–1.9)

## 2016-09-09 LAB — TSH: TSH: 1.87 u[IU]/mL (ref 0.35–4.50)

## 2016-09-09 LAB — HEMOGLOBIN A1C: Hgb A1c MFr Bld: 7.2 % — ABNORMAL HIGH (ref 4.6–6.5)

## 2016-09-09 MED ORDER — HYDROMORPHONE HCL 2 MG PO TABS: 2.0000 mg | ORAL_TABLET | Freq: Four times a day (QID) | ORAL | 0 refills | Status: DC | PRN

## 2016-09-09 MED ORDER — RIVAROXABAN 20 MG PO TABS: 20.0000 mg | ORAL_TABLET | Freq: Every day | ORAL | 2 refills | Status: DC

## 2016-09-09 NOTE — Progress Notes (Signed)
Pre visit review using our clinic review tool, if applicable. No additional management support is needed unless otherwise documented below in the visit note. 

## 2016-09-09 NOTE — Progress Notes (Signed)
   Subjective:    Patient ID: [name], male    DOB: [birth year], 83 y.o.   MRN: [id number]  HPI New to establish.  Previous MD- [facility]  DM- chronic problem, on Levemir and Humalog per the [place].  [facility]  Due for eye exam in Feb.  UTD on foot exam.  Due for microalbumin.  Pt says A1Cs run around 7.  Pt has decreased his eating recently.  Levemir is down to 35 units QHS.  CBG this AM 116.  Denies numbness/tingling of hands or feet  Hyperlipidemia- chronic problem, on Simvastatin.  Denies abd pain, N/V, myalgias  HTN- chronic problem, on Coreg w/ adequate control.  Restarted Lasix yesterday for swelling.  Denies CP, SOB, HAs, visual changes.  PTSD- chronic problem, on Seroquel and Galantamine due to experiences in [place]  PE- pt is currently on Xarelto.  Pt had fallen 10/7 and had R bimalleolar fx.  Just recently got cast removed.  In order for pt to complete 6 months of anticoagulation, he will need meds until April.  Needs refill.  Chronic pain- L knee is bone on bone and he is recovering from R ankle fx.  Unable to take oxy, was previously on 2mg  Dilaudid for use at night for chronic pain.   Review of Systems For ROS see HPI     Objective:   Physical Exam  Constitutional: He is oriented to person, place, and time. He appears well-developed and well-nourished. No distress.  obese  HENT:  Head: Normocephalic and atraumatic.  Eyes: Conjunctivae and EOM are normal. Pupils are equal, round, and reactive to light.  Neck: Normal range of motion. Neck supple. No thyromegaly present.  Cardiovascular: Normal rate, regular rhythm, normal heart sounds and intact distal pulses.   No murmur heard. Pulmonary/Chest: Effort normal and breath sounds normal. No respiratory distress.  Abdominal: Soft. Bowel sounds are normal. He exhibits no distension.  Drain in place RUQ, no TTP  Musculoskeletal: He exhibits edema (1-2+ pitting edema of LEs bilaterally).  He exhibits no tenderness.  Lymphadenopathy:    He has no cervical adenopathy.  Neurological: He is alert and oriented to person, place, and time. No cranial nerve deficit.  Skin: Skin is warm and dry.  Psychiatric: He has a normal mood and affect. His behavior is normal.  Vitals reviewed.         Assessment & Plan:

## 2016-09-09 NOTE — Assessment & Plan Note (Signed)
New to provider, ongoing for pt.  Adequate control.  Just restarted Lasix for bilateral LE edema.  He has appt upcoming w/ [facility].  Interestingly, he is not on ACE/ARB in setting of DM.  Will get microalbumin today and if abnormal, may need to start.  Will monitor closely.

## 2016-09-09 NOTE — Patient Instructions (Addendum)
Schedule your complete physical in 6 months We'll notify you of your lab results and make any changes if needed I will reach out to Dr Redmond Pulling about the blood thinner- but until you hear from me, you need to continue it daily Continue to take your Lasix daily to improve your swelling Please have the [place] send me copies of labs/notes from your next visit so I can follow along Restart the Dilaudid as needed for leg pain Call with any questions or concerns Welcome!  We're glad to have you!

## 2016-09-09 NOTE — Addendum Note (Signed)
Addended by: Davis Gourd on: 09/09/2016 10:48 AM   Modules accepted: Orders

## 2016-09-09 NOTE — Assessment & Plan Note (Signed)
New to provider, ongoing for pt.  On basal insulin w/ sliding scale.  UTD on eye exam, foot exam done today.  Not on ACE/ARB so will do microalbumin today and determine if ACE/ARB is appropriate.  Check A1C as last was 8.4 and pt reports he is rarely above 7.  [facility] so will attempt to get records.  Will follow.

## 2016-09-09 NOTE — Assessment & Plan Note (Signed)
New to provider, ongoing for pt.  On Seroquel and Galantamine.  Will follow along and assist as able.

## 2016-09-09 NOTE — Assessment & Plan Note (Signed)
Pt had PE after bimalleolar fx in October.  Currently on Xarelto daily.  Pt needs to complete 6 months of anticoagulation- which would end the beginning of April- but he states he needs to stop Xarelto at request of Dr Redmond Pulling Sales promotion account executive).  Will reach out to Dr Redmond Pulling and determine what the plans are for the gallbladder to determine how long he should remain on anticoagulation.  Pt expressed understanding and is in agreement w/ plan.

## 2016-09-09 NOTE — Assessment & Plan Note (Signed)
New to provider, ongoing for pt.  On Simvastatin w/o difficulty.  Stressed need for healthy diet.  Check labs.  Adjust meds prn
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
## 2016-09-12 ENCOUNTER — Encounter: Payer: Self-pay | Admitting: Emergency Medicine

## 2016-09-13 DIAGNOSIS — S82841D Displaced bimalleolar fracture of right lower leg, subsequent encounter for closed fracture with routine healing: Secondary | ICD-10-CM | POA: Diagnosis not present

## 2016-09-13 DIAGNOSIS — E119 Type 2 diabetes mellitus without complications: Secondary | ICD-10-CM | POA: Diagnosis not present

## 2016-09-13 DIAGNOSIS — I110 Hypertensive heart disease with heart failure: Secondary | ICD-10-CM | POA: Diagnosis not present

## 2016-09-13 DIAGNOSIS — I509 Heart failure, unspecified: Secondary | ICD-10-CM | POA: Diagnosis not present

## 2016-09-13 DIAGNOSIS — Z4682 Encounter for fitting and adjustment of non-vascular catheter: Secondary | ICD-10-CM | POA: Diagnosis not present

## 2016-09-13 DIAGNOSIS — K8001 Calculus of gallbladder with acute cholecystitis with obstruction: Secondary | ICD-10-CM | POA: Diagnosis not present

## 2016-09-19 DIAGNOSIS — K8001 Calculus of gallbladder with acute cholecystitis with obstruction: Secondary | ICD-10-CM | POA: Diagnosis not present

## 2016-09-19 DIAGNOSIS — I110 Hypertensive heart disease with heart failure: Secondary | ICD-10-CM | POA: Diagnosis not present

## 2016-09-19 DIAGNOSIS — S82841D Displaced bimalleolar fracture of right lower leg, subsequent encounter for closed fracture with routine healing: Secondary | ICD-10-CM | POA: Diagnosis not present

## 2016-09-19 DIAGNOSIS — I509 Heart failure, unspecified: Secondary | ICD-10-CM | POA: Diagnosis not present

## 2016-09-19 DIAGNOSIS — E119 Type 2 diabetes mellitus without complications: Secondary | ICD-10-CM | POA: Diagnosis not present

## 2016-09-19 DIAGNOSIS — Z4682 Encounter for fitting and adjustment of non-vascular catheter: Secondary | ICD-10-CM | POA: Diagnosis not present

## 2016-09-26 DIAGNOSIS — I509 Heart failure, unspecified: Secondary | ICD-10-CM | POA: Diagnosis not present

## 2016-09-26 DIAGNOSIS — K8001 Calculus of gallbladder with acute cholecystitis with obstruction: Secondary | ICD-10-CM | POA: Diagnosis not present

## 2016-09-26 DIAGNOSIS — E119 Type 2 diabetes mellitus without complications: Secondary | ICD-10-CM | POA: Diagnosis not present

## 2016-09-26 DIAGNOSIS — Z4682 Encounter for fitting and adjustment of non-vascular catheter: Secondary | ICD-10-CM | POA: Diagnosis not present

## 2016-09-26 DIAGNOSIS — S82841D Displaced bimalleolar fracture of right lower leg, subsequent encounter for closed fracture with routine healing: Secondary | ICD-10-CM | POA: Diagnosis not present

## 2016-09-26 DIAGNOSIS — I110 Hypertensive heart disease with heart failure: Secondary | ICD-10-CM | POA: Diagnosis not present

## 2016-09-27 ENCOUNTER — Other Ambulatory Visit: Payer: Self-pay | Admitting: Family Medicine

## 2016-09-27 ENCOUNTER — Telehealth: Payer: Self-pay | Admitting: Family Medicine

## 2016-09-27 MED ORDER — CARVEDILOL 3.125 MG PO TABS: 3.1250 mg | ORAL_TABLET | Freq: Two times a day (BID) | ORAL | 1 refills | Status: DC

## 2016-09-27 MED ORDER — HYDROMORPHONE HCL 2 MG PO TABS: 2.0000 mg | ORAL_TABLET | Freq: Four times a day (QID) | ORAL | 0 refills | Status: DC | PRN

## 2016-09-27 NOTE — Telephone Encounter (Signed)
Spoke with pt he advised that he takes two tablets a day one in the morning and 1 in the evening.

## 2016-09-27 NOTE — Telephone Encounter (Signed)
[facility] for #60 of dilaudid

## 2016-09-27 NOTE — Telephone Encounter (Signed)
Pt asking that refills for XARELTO be sent to [facility].

## 2016-09-27 NOTE — Telephone Encounter (Signed)
[place] for Carvedilol to mail order Please ask pt how often he is taking the Dilaudid as this was filled 18 days ago

## 2016-09-27 NOTE — Telephone Encounter (Signed)
Patient calling to request refills of the following:  carvedilol (COREG) 3.125 MG tablet HYDROmorphone (DILAUDID) 2 MG tablet  Pharmacy: [facility] [address] [place] [phone] (Phone) [phone] (Fax)

## 2016-09-27 NOTE — Telephone Encounter (Signed)
Last OV 09/09/16 Dilaudid last filled 09/09/16 #30 with 0

## 2016-09-27 NOTE — Telephone Encounter (Signed)
Med filled and pt made aware to pick up prescription at our front desk.

## 2016-09-28 MED ORDER — RIVAROXABAN 20 MG PO TABS: 20.0000 mg | ORAL_TABLET | Freq: Every day | ORAL | 1 refills | Status: DC

## 2016-09-28 NOTE — Telephone Encounter (Signed)
Medication filled to pharmacy as requested.   

## 2016-09-30 LAB — HM DIABETES EYE EXAM

## 2016-10-05 ENCOUNTER — Telehealth: Payer: Self-pay | Admitting: Family Medicine

## 2016-10-05 DIAGNOSIS — Z4682 Encounter for fitting and adjustment of non-vascular catheter: Secondary | ICD-10-CM | POA: Diagnosis not present

## 2016-10-05 DIAGNOSIS — E1122 Type 2 diabetes mellitus with diabetic chronic kidney disease: Secondary | ICD-10-CM | POA: Diagnosis not present

## 2016-10-05 DIAGNOSIS — I10 Essential (primary) hypertension: Secondary | ICD-10-CM | POA: Diagnosis not present

## 2016-10-05 DIAGNOSIS — I509 Heart failure, unspecified: Secondary | ICD-10-CM | POA: Diagnosis not present

## 2016-10-05 DIAGNOSIS — E119 Type 2 diabetes mellitus without complications: Secondary | ICD-10-CM | POA: Diagnosis not present

## 2016-10-05 DIAGNOSIS — S82841D Displaced bimalleolar fracture of right lower leg, subsequent encounter for closed fracture with routine healing: Secondary | ICD-10-CM | POA: Diagnosis not present

## 2016-10-05 DIAGNOSIS — K8001 Calculus of gallbladder with acute cholecystitis with obstruction: Secondary | ICD-10-CM | POA: Diagnosis not present

## 2016-10-05 DIAGNOSIS — I110 Hypertensive heart disease with heart failure: Secondary | ICD-10-CM | POA: Diagnosis not present

## 2016-10-05 DIAGNOSIS — I491 Atrial premature depolarization: Secondary | ICD-10-CM | POA: Diagnosis not present

## 2016-10-05 DIAGNOSIS — Z01810 Encounter for preprocedural cardiovascular examination: Secondary | ICD-10-CM | POA: Diagnosis not present

## 2016-10-05 MED ORDER — TAMSULOSIN HCL 0.4 MG PO CAPS: 0.4000 mg | ORAL_CAPSULE | Freq: Every day | ORAL | 1 refills | Status: DC

## 2016-10-05 NOTE — Telephone Encounter (Signed)
Patient requesting the following:  1.  30 day supply of tamsulosin (FLOMAX) 0.4 MG CAPS capsule sent to [facility].  2.  90 day supply of tamsulosin (FLOMAX) 0.4 MG CAPS capsule sent to [facility](per patient)

## 2016-10-05 NOTE — Telephone Encounter (Signed)
Medication filled to pharmacy as requested.   

## 2016-10-07 ENCOUNTER — Telehealth: Payer: Self-pay | Admitting: Family Medicine

## 2016-10-07 NOTE — Telephone Encounter (Signed)
Spoke with patient and he advised that he has been using both his levemir and novolog on sliding scales and his sugars have been fluctuating. Pt was advised that per PCP he needs to take levemir 20units at bedtime this weekend and use the sliding scale for the humalog as the sugars warrant.   Pt has an appt on 2/16 with the [facility] for diabetic follow up, but he will call us on Monday to advise of his sugar readings this weekend that way we can make adjustments to the levemir if needed.

## 2016-10-07 NOTE — Telephone Encounter (Signed)
Patient called and stated that he takes 42 units of long term insulin at night and lately he has been on a diet. His units have dropped and need to know if that is okay or if he needs to get scheduled to come in. Please call patient back at [phone].   Thank you.

## 2016-10-07 NOTE — Telephone Encounter (Signed)
[facility] so I'm not sure what the question is.

## 2016-10-10 ENCOUNTER — Telehealth: Payer: Self-pay | Admitting: General Practice

## 2016-10-10 NOTE — Telephone Encounter (Signed)
Can you call pt and verify his sugar levels?

## 2016-10-10 NOTE — Telephone Encounter (Signed)
This blood sugars are much more reasonable and I'm thrilled with how they look.  No med changes at this time

## 2016-10-10 NOTE — Telephone Encounter (Signed)
-----   Message from Francine Graven sent at 10/10/2016  8:43 AM EST ----- Contact: patient Patient returning call to you regarding weekend blood sugar levels.

## 2016-10-10 NOTE — Telephone Encounter (Signed)
February 10th - 8am - 75   Afternoon: 125   Evening: 110  February 11th - 8am - 74,  1:30pm - 134 7pm- 141   8:00 this morning it was 103   Has not had any of the short term insulin in a couple weeks.     Patient asking if he needs to change anything with the long acting insulin.

## 2016-10-10 NOTE — Telephone Encounter (Signed)
Patient has been informed of information.   Stated verbal understanding and was appreciative of the call.

## 2016-10-11 DIAGNOSIS — E119 Type 2 diabetes mellitus without complications: Secondary | ICD-10-CM | POA: Diagnosis not present

## 2016-10-11 DIAGNOSIS — K8001 Calculus of gallbladder with acute cholecystitis with obstruction: Secondary | ICD-10-CM | POA: Diagnosis not present

## 2016-10-11 DIAGNOSIS — I110 Hypertensive heart disease with heart failure: Secondary | ICD-10-CM | POA: Diagnosis not present

## 2016-10-11 DIAGNOSIS — S82841D Displaced bimalleolar fracture of right lower leg, subsequent encounter for closed fracture with routine healing: Secondary | ICD-10-CM | POA: Diagnosis not present

## 2016-10-11 DIAGNOSIS — Z4682 Encounter for fitting and adjustment of non-vascular catheter: Secondary | ICD-10-CM | POA: Diagnosis not present

## 2016-10-11 DIAGNOSIS — I509 Heart failure, unspecified: Secondary | ICD-10-CM | POA: Diagnosis not present

## 2016-10-12 DIAGNOSIS — R0989 Other specified symptoms and signs involving the circulatory and respiratory systems: Secondary | ICD-10-CM | POA: Diagnosis not present

## 2016-10-12 DIAGNOSIS — I493 Ventricular premature depolarization: Secondary | ICD-10-CM | POA: Diagnosis not present

## 2016-10-12 DIAGNOSIS — Z01810 Encounter for preprocedural cardiovascular examination: Secondary | ICD-10-CM | POA: Diagnosis not present

## 2016-10-17 DIAGNOSIS — Z01810 Encounter for preprocedural cardiovascular examination: Secondary | ICD-10-CM | POA: Diagnosis not present

## 2016-10-17 DIAGNOSIS — I493 Ventricular premature depolarization: Secondary | ICD-10-CM | POA: Diagnosis not present

## 2016-10-25 DIAGNOSIS — Z01810 Encounter for preprocedural cardiovascular examination: Secondary | ICD-10-CM | POA: Diagnosis not present

## 2016-10-25 DIAGNOSIS — E7800 Pure hypercholesterolemia, unspecified: Secondary | ICD-10-CM | POA: Diagnosis not present

## 2016-10-25 DIAGNOSIS — Z86711 Personal history of pulmonary embolism: Secondary | ICD-10-CM | POA: Diagnosis not present

## 2016-10-26 ENCOUNTER — Telehealth: Payer: Self-pay | Admitting: Family Medicine

## 2016-10-26 NOTE — Telephone Encounter (Signed)
Patient wanted [place] to know that his Cardiologist took him off Zocor and put him on Crestor 20mg .  I have made the correction in his chart.     He is also questioning the flomax.  He said that someone here was supposed to be working on getting a [facility] supply [facility] and he was wondering if that was done.

## 2016-10-26 NOTE — Telephone Encounter (Signed)
Could you please call and verify what pt is needing?

## 2016-10-26 NOTE — Telephone Encounter (Signed)
Pt is calling asking questions about is meds and needs clarification and which one is which and asking about mg.

## 2016-10-27 MED ORDER — TAMSULOSIN HCL 0.4 MG PO CAPS: 0.4000 mg | ORAL_CAPSULE | Freq: Every day | ORAL | 1 refills | Status: DC

## 2016-10-27 NOTE — Telephone Encounter (Signed)
Medication filled to pharmacy as requested.   

## 2016-10-27 NOTE — Telephone Encounter (Signed)
Ok for 90 days of Flomax

## 2016-11-23 ENCOUNTER — Telehealth: Payer: Self-pay | Admitting: Family Medicine

## 2016-11-23 DIAGNOSIS — K819 Cholecystitis, unspecified: Secondary | ICD-10-CM | POA: Diagnosis not present

## 2016-11-23 DIAGNOSIS — I10 Essential (primary) hypertension: Secondary | ICD-10-CM | POA: Diagnosis not present

## 2016-11-23 DIAGNOSIS — E1169 Type 2 diabetes mellitus with other specified complication: Secondary | ICD-10-CM | POA: Diagnosis not present

## 2016-11-23 DIAGNOSIS — E669 Obesity, unspecified: Secondary | ICD-10-CM | POA: Diagnosis not present

## 2016-11-23 DIAGNOSIS — Z86711 Personal history of pulmonary embolism: Secondary | ICD-10-CM | POA: Diagnosis not present

## 2016-11-23 NOTE — Telephone Encounter (Signed)
Last OV note faxed with documentation of patient taking xarelto.

## 2016-11-23 NOTE — Telephone Encounter (Signed)
Patient calling to report he is able to get his rivaroxaban (XARELTO) 20 MG TABS tablet through the [facility] an no out of pocket expense.  However, the [facility] has informed him they have no documentation that he has been prescribed this medication.  They have requested to have documentation faxed to them showing patient has been prescribed this medication before they can fill it for him.  Please fax to [phone] attn: Dr. Tonna Corner

## 2016-12-12 DIAGNOSIS — M25562 Pain in left knee: Secondary | ICD-10-CM | POA: Diagnosis not present

## 2016-12-12 DIAGNOSIS — M170 Bilateral primary osteoarthritis of knee: Secondary | ICD-10-CM | POA: Diagnosis not present

## 2016-12-12 DIAGNOSIS — M25561 Pain in right knee: Secondary | ICD-10-CM | POA: Diagnosis not present

## 2016-12-13 ENCOUNTER — Telehealth: Payer: Self-pay | Admitting: Family Medicine

## 2016-12-13 NOTE — Telephone Encounter (Signed)
Pt called back and schedule surgical clearance.

## 2016-12-13 NOTE — Telephone Encounter (Signed)
Called pt to schedule surgical clearance appt. Pt asking why an appt was needed when [facility] was doing everything for him, also pt was not home were he could at calender to schedule.

## 2016-12-14 NOTE — Telephone Encounter (Signed)
Noted  

## 2016-12-17 ENCOUNTER — Inpatient Hospital Stay (HOSPITAL_COMMUNITY)
Admission: EM | Admit: 2016-12-17 | Discharge: 2016-12-20 | DRG: 871 | Disposition: A | Discharge status: Home / Self Care | Payer: Medicare Other | Attending: Family Medicine | Admitting: Family Medicine

## 2016-12-17 ENCOUNTER — Encounter (HOSPITAL_COMMUNITY): Payer: Self-pay | Admitting: Emergency Medicine

## 2016-12-17 ENCOUNTER — Emergency Department (HOSPITAL_COMMUNITY): Payer: Medicare Other

## 2016-12-17 DIAGNOSIS — N400 Enlarged prostate without lower urinary tract symptoms: Secondary | ICD-10-CM | POA: Diagnosis present

## 2016-12-17 DIAGNOSIS — I10 Essential (primary) hypertension: Secondary | ICD-10-CM | POA: Diagnosis present

## 2016-12-17 DIAGNOSIS — R6521 Severe sepsis with septic shock: Secondary | ICD-10-CM | POA: Diagnosis present

## 2016-12-17 DIAGNOSIS — Z9989 Dependence on other enabling machines and devices: Secondary | ICD-10-CM

## 2016-12-17 DIAGNOSIS — Z794 Long term (current) use of insulin: Secondary | ICD-10-CM

## 2016-12-17 DIAGNOSIS — Z9119 Patient's noncompliance with other medical treatment and regimen: Secondary | ICD-10-CM

## 2016-12-17 DIAGNOSIS — R300 Dysuria: Secondary | ICD-10-CM | POA: Diagnosis not present

## 2016-12-17 DIAGNOSIS — A419 Sepsis, unspecified organism: Secondary | ICD-10-CM | POA: Diagnosis not present

## 2016-12-17 DIAGNOSIS — N179 Acute kidney failure, unspecified: Secondary | ICD-10-CM

## 2016-12-17 DIAGNOSIS — G894 Chronic pain syndrome: Secondary | ICD-10-CM | POA: Diagnosis present

## 2016-12-17 DIAGNOSIS — N3000 Acute cystitis without hematuria: Secondary | ICD-10-CM | POA: Diagnosis not present

## 2016-12-17 DIAGNOSIS — E860 Dehydration: Secondary | ICD-10-CM | POA: Diagnosis present

## 2016-12-17 DIAGNOSIS — N3090 Cystitis, unspecified without hematuria: Secondary | ICD-10-CM

## 2016-12-17 DIAGNOSIS — R52 Pain, unspecified: Secondary | ICD-10-CM

## 2016-12-17 DIAGNOSIS — B9689 Other specified bacterial agents as the cause of diseases classified elsewhere: Secondary | ICD-10-CM | POA: Diagnosis present

## 2016-12-17 DIAGNOSIS — F17220 Nicotine dependence, chewing tobacco, uncomplicated: Secondary | ICD-10-CM | POA: Diagnosis present

## 2016-12-17 DIAGNOSIS — Z993 Dependence on wheelchair: Secondary | ICD-10-CM

## 2016-12-17 DIAGNOSIS — Z86718 Personal history of other venous thrombosis and embolism: Secondary | ICD-10-CM

## 2016-12-17 DIAGNOSIS — E119 Type 2 diabetes mellitus without complications: Secondary | ICD-10-CM | POA: Diagnosis present

## 2016-12-17 DIAGNOSIS — N5082 Scrotal pain: Secondary | ICD-10-CM | POA: Diagnosis present

## 2016-12-17 DIAGNOSIS — N183 Chronic kidney disease, stage 3 (moderate): Secondary | ICD-10-CM

## 2016-12-17 DIAGNOSIS — E1121 Type 2 diabetes mellitus with diabetic nephropathy: Secondary | ICD-10-CM | POA: Diagnosis present

## 2016-12-17 DIAGNOSIS — E1122 Type 2 diabetes mellitus with diabetic chronic kidney disease: Secondary | ICD-10-CM | POA: Diagnosis present

## 2016-12-17 DIAGNOSIS — E7800 Pure hypercholesterolemia, unspecified: Secondary | ICD-10-CM | POA: Diagnosis present

## 2016-12-17 DIAGNOSIS — G4733 Obstructive sleep apnea (adult) (pediatric): Secondary | ICD-10-CM

## 2016-12-17 DIAGNOSIS — R109 Unspecified abdominal pain: Secondary | ICD-10-CM | POA: Diagnosis not present

## 2016-12-17 DIAGNOSIS — K5730 Diverticulosis of large intestine without perforation or abscess without bleeding: Secondary | ICD-10-CM | POA: Diagnosis not present

## 2016-12-17 DIAGNOSIS — K219 Gastro-esophageal reflux disease without esophagitis: Secondary | ICD-10-CM | POA: Diagnosis present

## 2016-12-17 DIAGNOSIS — F4310 Post-traumatic stress disorder, unspecified: Secondary | ICD-10-CM | POA: Diagnosis present

## 2016-12-17 DIAGNOSIS — E785 Hyperlipidemia, unspecified: Secondary | ICD-10-CM | POA: Diagnosis present

## 2016-12-17 DIAGNOSIS — D72829 Elevated white blood cell count, unspecified: Secondary | ICD-10-CM | POA: Diagnosis present

## 2016-12-17 DIAGNOSIS — Z6833 Body mass index (BMI) 33.0-33.9, adult: Secondary | ICD-10-CM

## 2016-12-17 DIAGNOSIS — N390 Urinary tract infection, site not specified: Secondary | ICD-10-CM | POA: Diagnosis present

## 2016-12-17 DIAGNOSIS — Z86711 Personal history of pulmonary embolism: Secondary | ICD-10-CM

## 2016-12-17 DIAGNOSIS — Z7901 Long term (current) use of anticoagulants: Secondary | ICD-10-CM

## 2016-12-17 HISTORY — DX: Disease of gallbladder, unspecified: K82.9

## 2016-12-17 LAB — CBC WITH DIFFERENTIAL/PLATELET
Basophils Absolute: 0 10*3/uL (ref 0.0–0.1)
Basophils Relative: 0 %
EOS PCT: 0 %
Eosinophils Absolute: 0 10*3/uL (ref 0.0–0.7)
HEMATOCRIT: 40 % (ref 39.0–52.0)
Hemoglobin: 13.2 g/dL (ref 13.0–17.0)
LYMPHS PCT: 8 %
Lymphs Abs: 2.3 10*3/uL (ref 0.7–4.0)
MCH: 28.2 pg (ref 26.0–34.0)
MCHC: 33 g/dL (ref 30.0–36.0)
MCV: 85.5 fL (ref 78.0–100.0)
Monocytes Absolute: 3.1 10*3/uL — ABNORMAL HIGH (ref 0.1–1.0)
Monocytes Relative: 11 %
NEUTROS PCT: 81 %
Neutro Abs: 22.9 10*3/uL — ABNORMAL HIGH (ref 1.7–7.7)
Platelets: 232 10*3/uL (ref 150–400)
RBC: 4.68 MIL/uL (ref 4.22–5.81)
RDW: 15.3 % (ref 11.5–15.5)
WBC: 28.3 10*3/uL — AB (ref 4.0–10.5)

## 2016-12-17 LAB — BASIC METABOLIC PANEL
ANION GAP: 7 (ref 5–15)
BUN: 24 mg/dL — ABNORMAL HIGH (ref 6–20)
CO2: 28 mmol/L (ref 22–32)
Calcium: 9.1 mg/dL (ref 8.9–10.3)
Chloride: 101 mmol/L (ref 101–111)
Creatinine, Ser: 1.52 mg/dL — ABNORMAL HIGH (ref 0.61–1.24)
GFR calc Af Amer: 47 mL/min — ABNORMAL LOW (ref >60)
GFR, EST NON AFRICAN AMERICAN: 41 mL/min — AB (ref >60)
GLUCOSE: 165 mg/dL — AB (ref 65–99)
Potassium: 4.7 mmol/L (ref 3.5–5.1)
Sodium: 136 mmol/L (ref 135–145)

## 2016-12-17 LAB — URINALYSIS, ROUTINE W REFLEX MICROSCOPIC
Bilirubin Urine: NEGATIVE
GLUCOSE, UA: NEGATIVE mg/dL
Ketones, ur: 5 mg/dL — AB
NITRITE: NEGATIVE
PH: 6 (ref 5.0–8.0)
PROTEIN: 100 mg/dL — AB
Specific Gravity, Urine: 1.015 (ref 1.005–1.030)

## 2016-12-17 LAB — CBG MONITORING, ED: GLUCOSE-CAPILLARY: 165 mg/dL — AB (ref 65–99)

## 2016-12-17 LAB — GLUCOSE, CAPILLARY
GLUCOSE-CAPILLARY: 141 mg/dL — AB (ref 65–99)
GLUCOSE-CAPILLARY: 161 mg/dL — AB (ref 65–99)

## 2016-12-17 MED ORDER — DEXTROSE 5 % IV SOLN: 1.0000 g | INTRAVENOUS | Status: DC

## 2016-12-17 MED ORDER — TAMSULOSIN HCL 0.4 MG PO CAPS
0.4000 mg | ORAL_CAPSULE | Freq: Every day | ORAL | Status: DC
  Administered 2016-12-17 – 2016-12-19 (×3): 0.4 mg via ORAL
  Filled 2016-12-17 (×3): qty 1

## 2016-12-17 MED ORDER — ONDANSETRON HCL 4 MG PO TABS
4.0000 mg | ORAL_TABLET | Freq: Four times a day (QID) | ORAL | Status: DC | PRN
  Administered 2016-12-18: 4 mg via ORAL
  Filled 2016-12-17 (×2): qty 1

## 2016-12-17 MED ORDER — HYDROMORPHONE HCL 2 MG PO TABS
2.0000 mg | ORAL_TABLET | Freq: Four times a day (QID) | ORAL | Status: DC | PRN
  Administered 2016-12-18 – 2016-12-20 (×5): 2 mg via ORAL
  Filled 2016-12-17 (×5): qty 1

## 2016-12-17 MED ORDER — ACETAMINOPHEN 650 MG RE SUPP: 650.0000 mg | Freq: Four times a day (QID) | RECTAL | Status: DC | PRN

## 2016-12-17 MED ORDER — SODIUM CHLORIDE 0.9 % IV BOLUS (SEPSIS)
1000.0000 mL | Freq: Once | INTRAVENOUS | Status: AC
  Administered 2016-12-17: 1000 mL via INTRAVENOUS

## 2016-12-17 MED ORDER — ONDANSETRON HCL 4 MG/2ML IJ SOLN: 4.0000 mg | Freq: Four times a day (QID) | INTRAMUSCULAR | Status: DC | PRN

## 2016-12-17 MED ORDER — ACETAMINOPHEN 325 MG PO TABS
650.0000 mg | ORAL_TABLET | Freq: Four times a day (QID) | ORAL | Status: DC | PRN
  Administered 2016-12-17 – 2016-12-19 (×3): 650 mg via ORAL
  Filled 2016-12-17 (×3): qty 2

## 2016-12-17 MED ORDER — MORPHINE SULFATE (PF) 4 MG/ML IV SOLN
4.0000 mg | Freq: Once | INTRAVENOUS | Status: AC
  Administered 2016-12-17: 4 mg via INTRAVENOUS
  Filled 2016-12-17: qty 1

## 2016-12-17 MED ORDER — GALANTAMINE HYDROBROMIDE 4 MG PO TABS
8.0000 mg | ORAL_TABLET | Freq: Two times a day (BID) | ORAL | Status: DC
  Administered 2016-12-18: 4 mg via ORAL
  Administered 2016-12-18 – 2016-12-20 (×4): 8 mg via ORAL
  Filled 2016-12-17 (×7): qty 2

## 2016-12-17 MED ORDER — METHOCARBAMOL 500 MG PO TABS
750.0000 mg | ORAL_TABLET | Freq: Four times a day (QID) | ORAL | Status: DC | PRN
  Administered 2016-12-19 – 2016-12-20 (×2): 750 mg via ORAL
  Filled 2016-12-17 (×2): qty 2

## 2016-12-17 MED ORDER — POLYETHYLENE GLYCOL 3350 17 G PO PACK
17.0000 g | PACK | Freq: Every day | ORAL | Status: DC
  Administered 2016-12-17 – 2016-12-20 (×3): 17 g via ORAL
  Filled 2016-12-17 (×3): qty 1

## 2016-12-17 MED ORDER — RIVAROXABAN 20 MG PO TABS
20.0000 mg | ORAL_TABLET | Freq: Every day | ORAL | Status: DC
  Administered 2016-12-17 – 2016-12-19 (×3): 20 mg via ORAL
  Filled 2016-12-17 (×3): qty 1

## 2016-12-17 MED ORDER — MAGNESIUM OXIDE 400 (241.3 MG) MG PO TABS
400.0000 mg | ORAL_TABLET | Freq: Two times a day (BID) | ORAL | Status: DC
  Administered 2016-12-17 – 2016-12-20 (×5): 400 mg via ORAL
  Filled 2016-12-17 (×5): qty 1

## 2016-12-17 MED ORDER — SODIUM CHLORIDE 0.9 % IV SOLN
INTRAVENOUS | Status: DC
  Administered 2016-12-17 – 2016-12-19 (×4): via INTRAVENOUS

## 2016-12-17 MED ORDER — IOPAMIDOL (ISOVUE-300) INJECTION 61%
INTRAVENOUS | Status: AC
  Filled 2016-12-17: qty 75

## 2016-12-17 MED ORDER — INSULIN DETEMIR 100 UNIT/ML ~~LOC~~ SOLN
20.0000 [IU] | Freq: Every day | SUBCUTANEOUS | Status: DC
  Administered 2016-12-17 – 2016-12-19 (×3): 20 [IU] via SUBCUTANEOUS
  Filled 2016-12-17 (×4): qty 0.2

## 2016-12-17 MED ORDER — TRAZODONE HCL 50 MG PO TABS
50.0000 mg | ORAL_TABLET | Freq: Every day | ORAL | Status: DC
  Administered 2016-12-17 – 2016-12-19 (×3): 50 mg via ORAL
  Filled 2016-12-17 (×3): qty 1

## 2016-12-17 MED ORDER — DEXTROSE 5 % IV SOLN
1.0000 g | Freq: Once | INTRAVENOUS | Status: AC
  Administered 2016-12-17: 1 g via INTRAVENOUS
  Filled 2016-12-17: qty 10

## 2016-12-17 MED ORDER — INSULIN ASPART 100 UNIT/ML ~~LOC~~ SOLN: 0.0000 [IU] | Freq: Every day | SUBCUTANEOUS | Status: DC

## 2016-12-17 MED ORDER — DOCUSATE SODIUM 100 MG PO CAPS
200.0000 mg | ORAL_CAPSULE | Freq: Two times a day (BID) | ORAL | Status: DC
  Administered 2016-12-17 – 2016-12-20 (×5): 200 mg via ORAL
  Filled 2016-12-17 (×5): qty 2

## 2016-12-17 MED ORDER — GALANTAMINE HYDROBROMIDE ER 8 MG PO CP24
16.0000 mg | ORAL_CAPSULE | Freq: Every day | ORAL | Status: DC
  Filled 2016-12-17: qty 2

## 2016-12-17 MED ORDER — QUETIAPINE FUMARATE 25 MG PO TABS
50.0000 mg | ORAL_TABLET | Freq: Every day | ORAL | Status: DC
  Administered 2016-12-17 – 2016-12-19 (×3): 50 mg via ORAL
  Filled 2016-12-17: qty 2
  Filled 2016-12-17: qty 1
  Filled 2016-12-17: qty 2
  Filled 2016-12-17: qty 1

## 2016-12-17 MED ORDER — ISOSORBIDE MONONITRATE ER 60 MG PO TB24
30.0000 mg | ORAL_TABLET | Freq: Every day | ORAL | Status: DC
  Administered 2016-12-17: 30 mg via ORAL
  Filled 2016-12-17: qty 1

## 2016-12-17 MED ORDER — INSULIN ASPART 100 UNIT/ML ~~LOC~~ SOLN
0.0000 [IU] | Freq: Three times a day (TID) | SUBCUTANEOUS | Status: DC
  Administered 2016-12-17: 1 [IU] via SUBCUTANEOUS
  Administered 2016-12-18: 2 [IU] via SUBCUTANEOUS
  Administered 2016-12-18: 1 [IU] via SUBCUTANEOUS
  Administered 2016-12-18: 2 [IU] via SUBCUTANEOUS
  Administered 2016-12-19 (×2): 1 [IU] via SUBCUTANEOUS

## 2016-12-17 MED ORDER — ALBUTEROL SULFATE (2.5 MG/3ML) 0.083% IN NEBU: 2.5000 mg | INHALATION_SOLUTION | Freq: Four times a day (QID) | RESPIRATORY_TRACT | Status: DC | PRN

## 2016-12-17 MED ORDER — PANTOPRAZOLE SODIUM 40 MG PO TBEC
40.0000 mg | DELAYED_RELEASE_TABLET | Freq: Every day | ORAL | Status: DC
  Administered 2016-12-17 – 2016-12-20 (×4): 40 mg via ORAL
  Filled 2016-12-17 (×4): qty 1

## 2016-12-17 MED ORDER — CARVEDILOL 3.125 MG PO TABS
3.1250 mg | ORAL_TABLET | Freq: Two times a day (BID) | ORAL | Status: DC
  Administered 2016-12-17: 3.125 mg via ORAL
  Filled 2016-12-17: qty 1

## 2016-12-17 NOTE — ED Triage Notes (Signed)
Pt reports scrotal pain. inability to urinate since yesterday. Pt called PCP and was referred to ED.Pt is alert, oriented and appropriate. Did not check blood sugar this am

## 2016-12-17 NOTE — Progress Notes (Signed)
Page: RM 1517 Filter has all of the criteria for sepsis P 102, R 24, T 100.9, Bp 84/40 and WBC's 28.3

## 2016-12-17 NOTE — ED Notes (Signed)
I have just given report to Helene Kelp, Therapist, sports; and will transport now.

## 2016-12-17 NOTE — ED Notes (Signed)
Pt. Bladder scan was 36ml. Nurse Aware.

## 2016-12-17 NOTE — Progress Notes (Signed)
Page: [name] 1517 [place] bp manually is 90/40 pt. is awake but is unable to tell me what he normally runs. temp 100.8
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
## 2016-12-17 NOTE — Progress Notes (Signed)
Page: Nadeen Landau 1517 [place] bolus in and bp is still 84/40. Temp 100.9 Giving tylenol now he had it earlier and couldn't get it again until now.
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
## 2016-12-18 ENCOUNTER — Observation Stay (HOSPITAL_BASED_OUTPATIENT_CLINIC_OR_DEPARTMENT_OTHER): Payer: Medicare Other

## 2016-12-18 ENCOUNTER — Observation Stay (HOSPITAL_COMMUNITY): Payer: Medicare Other

## 2016-12-18 DIAGNOSIS — N179 Acute kidney failure, unspecified: Secondary | ICD-10-CM | POA: Diagnosis not present

## 2016-12-18 DIAGNOSIS — N3090 Cystitis, unspecified without hematuria: Secondary | ICD-10-CM

## 2016-12-18 DIAGNOSIS — G894 Chronic pain syndrome: Secondary | ICD-10-CM | POA: Diagnosis not present

## 2016-12-18 DIAGNOSIS — I509 Heart failure, unspecified: Secondary | ICD-10-CM | POA: Diagnosis not present

## 2016-12-18 DIAGNOSIS — Z9989 Dependence on other enabling machines and devices: Secondary | ICD-10-CM | POA: Diagnosis not present

## 2016-12-18 DIAGNOSIS — E1122 Type 2 diabetes mellitus with diabetic chronic kidney disease: Secondary | ICD-10-CM

## 2016-12-18 DIAGNOSIS — N5082 Scrotal pain: Secondary | ICD-10-CM | POA: Diagnosis present

## 2016-12-18 DIAGNOSIS — Z993 Dependence on wheelchair: Secondary | ICD-10-CM | POA: Diagnosis not present

## 2016-12-18 DIAGNOSIS — E1121 Type 2 diabetes mellitus with diabetic nephropathy: Secondary | ICD-10-CM | POA: Diagnosis not present

## 2016-12-18 DIAGNOSIS — N400 Enlarged prostate without lower urinary tract symptoms: Secondary | ICD-10-CM | POA: Diagnosis present

## 2016-12-18 DIAGNOSIS — I10 Essential (primary) hypertension: Secondary | ICD-10-CM | POA: Diagnosis not present

## 2016-12-18 DIAGNOSIS — R6521 Severe sepsis with septic shock: Secondary | ICD-10-CM | POA: Diagnosis present

## 2016-12-18 DIAGNOSIS — E7800 Pure hypercholesterolemia, unspecified: Secondary | ICD-10-CM | POA: Diagnosis present

## 2016-12-18 DIAGNOSIS — E785 Hyperlipidemia, unspecified: Secondary | ICD-10-CM | POA: Diagnosis present

## 2016-12-18 DIAGNOSIS — N390 Urinary tract infection, site not specified: Secondary | ICD-10-CM | POA: Diagnosis not present

## 2016-12-18 DIAGNOSIS — R52 Pain, unspecified: Secondary | ICD-10-CM | POA: Diagnosis not present

## 2016-12-18 DIAGNOSIS — A4151 Sepsis due to Escherichia coli [E. coli]: Secondary | ICD-10-CM | POA: Diagnosis not present

## 2016-12-18 DIAGNOSIS — R300 Dysuria: Secondary | ICD-10-CM | POA: Diagnosis not present

## 2016-12-18 DIAGNOSIS — R918 Other nonspecific abnormal finding of lung field: Secondary | ICD-10-CM | POA: Diagnosis not present

## 2016-12-18 DIAGNOSIS — Z7901 Long term (current) use of anticoagulants: Secondary | ICD-10-CM | POA: Diagnosis not present

## 2016-12-18 DIAGNOSIS — G4733 Obstructive sleep apnea (adult) (pediatric): Secondary | ICD-10-CM

## 2016-12-18 DIAGNOSIS — Z86711 Personal history of pulmonary embolism: Secondary | ICD-10-CM | POA: Diagnosis not present

## 2016-12-18 DIAGNOSIS — N3000 Acute cystitis without hematuria: Secondary | ICD-10-CM | POA: Diagnosis not present

## 2016-12-18 DIAGNOSIS — N183 Chronic kidney disease, stage 3 (moderate): Secondary | ICD-10-CM | POA: Diagnosis not present

## 2016-12-18 DIAGNOSIS — A419 Sepsis, unspecified organism: Principal | ICD-10-CM

## 2016-12-18 DIAGNOSIS — B9689 Other specified bacterial agents as the cause of diseases classified elsewhere: Secondary | ICD-10-CM | POA: Diagnosis present

## 2016-12-18 DIAGNOSIS — Z794 Long term (current) use of insulin: Secondary | ICD-10-CM | POA: Diagnosis not present

## 2016-12-18 DIAGNOSIS — F17220 Nicotine dependence, chewing tobacco, uncomplicated: Secondary | ICD-10-CM | POA: Diagnosis present

## 2016-12-18 DIAGNOSIS — Z9119 Patient's noncompliance with other medical treatment and regimen: Secondary | ICD-10-CM | POA: Diagnosis not present

## 2016-12-18 DIAGNOSIS — R0989 Other specified symptoms and signs involving the circulatory and respiratory systems: Secondary | ICD-10-CM | POA: Diagnosis not present

## 2016-12-18 DIAGNOSIS — F4310 Post-traumatic stress disorder, unspecified: Secondary | ICD-10-CM | POA: Diagnosis present

## 2016-12-18 DIAGNOSIS — Z86718 Personal history of other venous thrombosis and embolism: Secondary | ICD-10-CM | POA: Diagnosis not present

## 2016-12-18 DIAGNOSIS — K219 Gastro-esophageal reflux disease without esophagitis: Secondary | ICD-10-CM | POA: Diagnosis present

## 2016-12-18 DIAGNOSIS — E860 Dehydration: Secondary | ICD-10-CM | POA: Diagnosis present

## 2016-12-18 LAB — COMPREHENSIVE METABOLIC PANEL
ALBUMIN: 3.1 g/dL — AB (ref 3.5–5.0)
ALK PHOS: 73 U/L (ref 38–126)
ALT: 14 U/L — ABNORMAL LOW (ref 17–63)
ANION GAP: 10 (ref 5–15)
AST: 25 U/L (ref 15–41)
BILIRUBIN TOTAL: 1 mg/dL (ref 0.3–1.2)
BUN: 27 mg/dL — ABNORMAL HIGH (ref 6–20)
CALCIUM: 8.7 mg/dL — AB (ref 8.9–10.3)
CO2: 23 mmol/L (ref 22–32)
Chloride: 102 mmol/L (ref 101–111)
Creatinine, Ser: 1.85 mg/dL — ABNORMAL HIGH (ref 0.61–1.24)
GFR calc Af Amer: 37 mL/min — ABNORMAL LOW (ref >60)
GFR calc non Af Amer: 32 mL/min — ABNORMAL LOW (ref >60)
GLUCOSE: 176 mg/dL — AB (ref 65–99)
POTASSIUM: 4.5 mmol/L (ref 3.5–5.1)
SODIUM: 135 mmol/L (ref 135–145)
TOTAL PROTEIN: 6.4 g/dL — AB (ref 6.5–8.1)

## 2016-12-18 LAB — CBC WITH DIFFERENTIAL/PLATELET
BASOS ABS: 0 10*3/uL (ref 0.0–0.1)
BASOS PCT: 0 %
EOS ABS: 0 10*3/uL (ref 0.0–0.7)
Eosinophils Relative: 0 %
HEMATOCRIT: 38.7 % — AB (ref 39.0–52.0)
Hemoglobin: 13.1 g/dL (ref 13.0–17.0)
LYMPHS ABS: 1.9 10*3/uL (ref 0.7–4.0)
Lymphocytes Relative: 6 %
MCH: 29.1 pg (ref 26.0–34.0)
MCHC: 33.9 g/dL (ref 30.0–36.0)
MCV: 86 fL (ref 78.0–100.0)
Monocytes Absolute: 3.8 10*3/uL — ABNORMAL HIGH (ref 0.1–1.0)
Monocytes Relative: 12 %
NEUTROS ABS: 26.2 10*3/uL — AB (ref 1.7–7.7)
Neutrophils Relative %: 82 %
Platelets: 208 10*3/uL (ref 150–400)
RBC: 4.5 MIL/uL (ref 4.22–5.81)
RDW: 15.5 % (ref 11.5–15.5)
WBC Morphology: INCREASED
WBC: 31.9 10*3/uL — ABNORMAL HIGH (ref 4.0–10.5)

## 2016-12-18 LAB — ECHOCARDIOGRAM COMPLETE
Height: 71 in
WEIGHTICAEL: 3840 [oz_av]

## 2016-12-18 LAB — PROTIME-INR
INR: 2.66
PROTHROMBIN TIME: 28.8 s — AB (ref 11.4–15.2)

## 2016-12-18 LAB — GLUCOSE, CAPILLARY
GLUCOSE-CAPILLARY: 151 mg/dL — AB (ref 65–99)
GLUCOSE-CAPILLARY: 168 mg/dL — AB (ref 65–99)
Glucose-Capillary: 130 mg/dL — ABNORMAL HIGH (ref 65–99)
Glucose-Capillary: 165 mg/dL — ABNORMAL HIGH (ref 65–99)

## 2016-12-18 LAB — PROCALCITONIN: PROCALCITONIN: 0.89 ng/mL

## 2016-12-18 LAB — LACTIC ACID, PLASMA
Lactic Acid, Venous: 1.2 mmol/L (ref 0.5–1.9)
Lactic Acid, Venous: 1.3 mmol/L (ref 0.5–1.9)

## 2016-12-18 LAB — APTT: aPTT: 57 seconds — ABNORMAL HIGH (ref 24–36)

## 2016-12-18 LAB — MRSA PCR SCREENING: MRSA by PCR: NEGATIVE

## 2016-12-18 MED ORDER — CEFTRIAXONE SODIUM 1 G IJ SOLR
1.0000 g | Freq: Two times a day (BID) | INTRAMUSCULAR | Status: DC
Start: 2016-12-18 — End: 2016-12-18
  Administered 2016-12-18 (×2): 1 g via INTRAVENOUS
  Filled 2016-12-18 (×3): qty 10

## 2016-12-18 MED ORDER — SODIUM CHLORIDE 0.9 % IV BOLUS (SEPSIS)
1000.0000 mL | Freq: Once | INTRAVENOUS | Status: AC
  Administered 2016-12-18: 1000 mL via INTRAVENOUS

## 2016-12-18 MED ORDER — SODIUM CHLORIDE 0.9 % IV BOLUS (SEPSIS)
1000.0000 mL | Freq: Once | INTRAVENOUS | Status: DC
Start: 2016-12-18 — End: 2016-12-20

## 2016-12-18 MED ORDER — DEXTROSE 5 % IV SOLN
0.0000 ug/min | INTRAVENOUS | Status: DC
  Administered 2016-12-18: 4 ug/min via INTRAVENOUS
  Filled 2016-12-18: qty 4

## 2016-12-18 MED ORDER — DEXTROSE 5 % IV SOLN
1.0000 g | Freq: Once | INTRAVENOUS | Status: DC
  Filled 2016-12-18 (×2): qty 10

## 2016-12-18 MED ORDER — NOREPINEPHRINE 4 MG/250ML-% IV SOLN
0.0000 ug/min | INTRAVENOUS | Status: DC
  Filled 2016-12-18: qty 250

## 2016-12-18 MED ORDER — SODIUM CHLORIDE 0.9 % IV BOLUS (SEPSIS)
500.0000 mL | Freq: Once | INTRAVENOUS | Status: AC
  Administered 2016-12-18: 500 mL via INTRAVENOUS

## 2016-12-18 MED ORDER — DEXTROSE 5 % IV SOLN
2.0000 g | INTRAVENOUS | Status: DC
  Administered 2016-12-19 – 2016-12-20 (×2): 2 g via INTRAVENOUS
  Filled 2016-12-18 (×2): qty 2

## 2016-12-18 MED ORDER — PERFLUTREN LIPID MICROSPHERE
1.0000 mL | INTRAVENOUS | Status: AC | PRN
  Administered 2016-12-18: 2 mL via INTRAVENOUS
  Filled 2016-12-18: qty 10

## 2016-12-18 NOTE — Progress Notes (Signed)
  Echocardiogram 2D Echocardiogram has been performed.  Magenta Schmiesing L Androw 12/18/2016, 9:19 AM

## 2016-12-18 NOTE — Progress Notes (Signed)
Report called to Sam who will be receiving the pt. In room 1234

## 2016-12-18 NOTE — Consult Note (Signed)
PCCM Consult Note  Admission date: 12/17/2016 Consult date: 12/18/2016 Referring provider: Dr. Teryl Lucy  CC: dysuria  HPI: Asked by Dr. Teryl Lucy with [facility] to assess sepsis.  83 yo male developed scrotal pain and difficulty passing urine.  He felt full bladder.  Started on Abx for cystitis.  Had persistent hypotension and given several IV fluids boluses.  CT abd/pelvis was unremarkable.  He is sleepy, and feels achy.  c/o dry mouth.  Denies chest pain.  Wife reports he had cholecystostomy tube in recently and it "fell out".  He has tentative plan for cholecystectomy with Dr. Redmond Pulling in June.  He  has a past medical history of Arthritis; Chronic back pain; Constipation due to opioid therapy; Diabetes mellitus without complication ([place]); Full dentures; Gallbladder attack; Gastric perforation ([place]); GERD (gastroesophageal reflux disease); Hypercholesteremia; Hypertension; Pneumonia; Post traumatic stress disorder (PTSD); Pulmonary embolism ([place]); Sleep apnea; Wears glasses; and Wears hearing aid.   He  has a past surgical history that includes Rotator cuff repair; Ankle surgery; Lumbar laminectomy/decompression microdiscectomy (Left, 11/15/2012); laparoscopy (N/A, 11/17/2012); Colonoscopy; Trigger finger release (Left, 01/21/2014); Back surgery; bowel perforation surgery; ORIF calcaneous fracture (Right, 02/19/2015); ir generic historical (07/21/2016); ir generic historical (08/15/2016); ir generic historical (08/25/2016); and ir generic historical (09/05/2016).  His family history is negative for Colon cancer, CAD, and Diabetes.  He  reports that he has never smoked. His smokeless tobacco use includes Chew. He reports that he drinks alcohol. He reports that he does not use drugs.  Allergies  Allergen Reactions  . Oxycontin [Oxycodone Hcl] Other (See Comments)    Pt states he cant sleep w/ this med; also makes him constipated.    No current facility-administered medications on file prior to encounter.     Current Outpatient Prescriptions on File Prior to Encounter  Medication Sig  . albuterol (PROVENTIL) (2.5 MG/3ML) 0.083% nebulizer solution Take 3 mLs (2.5 mg total) by nebulization every 6 (six) hours as needed for wheezing or shortness of breath.  . carvedilol (COREG) 3.125 MG tablet Take 1 tablet (3.125 mg total) by mouth 2 (two) times daily with a meal.  . Cholecalciferol (VITAMIN D3) 2000 units capsule Take 2,000 Units by mouth daily.  . cyanocobalamin 500 MCG tablet Take 500 mcg by mouth 2 (two) times daily.  Marland Kitchen galantamine (RAZADYNE ER) 16 MG 24 hr capsule Take 16 mg by mouth daily with breakfast.  . HYDROmorphone (DILAUDID) 2 MG tablet Take 1 tablet (2 mg total) by mouth every 6 (six) hours as needed for severe pain.  Marland Kitchen insulin detemir (LEVEMIR) 100 UNIT/ML injection Inject 0.2 mLs (20 Units total) into the skin at bedtime.  . isosorbide mononitrate (IMDUR) 30 MG 24 hr tablet Take 30 mg by mouth every morning.   . magnesium oxide (MAG-OX) 400 MG tablet Take 400 mg by mouth 2 (two) times daily.  . methocarbamol (ROBAXIN) 750 MG tablet Take 1 tablet (750 mg total) by mouth every 6 (six) hours as needed for muscle spasms.  . Multiple Vitamins-Minerals (PRESERVISION AREDS 2 PO) Take 1 capsule by mouth 2 (two) times daily.  Marland Kitchen NUTRITIONAL SUPPLEMENT LIQD Take 120 mLs by mouth 2 (two) times daily.  . pantoprazole (PROTONIX) 40 MG tablet Take 1 tablet (40 mg total) by mouth daily.  . polyethylene glycol (MIRALAX / GLYCOLAX) packet Take 17 g by mouth daily.  . QUEtiapine (SEROQUEL) 50 MG tablet Take 50 mg by mouth at bedtime.   . rivaroxaban (XARELTO) 20 MG TABS tablet Take 1 tablet (20 mg  total) by mouth daily.  . rosuvastatin (CRESTOR) 20 MG tablet Take 20 mg by mouth daily.  . tamsulosin (FLOMAX) 0.4 MG CAPS capsule Take 1 capsule (0.4 mg total) by mouth daily after supper.  . insulin detemir (LEVEMIR) 100 UNIT/ML injection Inject 0.2 mLs (20 Units total) into the skin at bedtime.  .  insulin lispro (HUMALOG) 100 UNIT/ML injection Inject 0-5 Units into the skin 3 (three) times daily before meals. 0-200 = 0 units, 201-250 = 2 units, 251-300 = 3 units, 301-350 = 4 units, 351-400 = 5 units, >401 call MD/NP  . Melatonin 5 MG TABS Take 5 mg by mouth at bedtime.    ROS: 12 point ROS negative except above.  Vital signs: BP (!) 104/35   Pulse 94   Temp 100.2 F (37.9 C) (Axillary) Comment: RN notified   Resp (!) 29   Ht 5\' 11"  (1.803 m)   Wt 240 lb (108.9 kg)   SpO2 (!) 89%   BMI 33.47 kg/m   Intake/output: I/O last 3 completed shifts: In: 2388.8 [I.V.:838.8; IV Piggyback:1550] Out: 335 [Urine:335]  Sleepy, follows commands, dry mucosa, HR regular, no wheeze, Abd soft and non tender, foley in place with dark urine, no edema, no rashes   CMP Latest Ref Rng & Units 12/18/2016 12/17/2016 09/09/2016  Glucose 65 - 99 mg/dL 176(H) 165(H) 114(H)  BUN 6 - 20 mg/dL 27(H) 24(H) 21  Creatinine 0.61 - 1.24 mg/dL 1.85(H) 1.52(H) 1.29  Sodium 135 - 145 mmol/L 135 136 142  Potassium 3.5 - 5.1 mmol/L 4.5 4.7 4.6  Chloride 101 - 111 mmol/L 102 101 105  CO2 22 - 32 mmol/L 23 28 30   Calcium 8.9 - 10.3 mg/dL 8.7(L) 9.1 9.2  Total Protein 6.5 - 8.1 g/dL 6.4(L) - 6.5  Total Bilirubin 0.3 - 1.2 mg/dL 1.0 - 0.6  Alkaline Phos 38 - 126 U/L 73 - 114  AST 15 - 41 U/L 25 - 15  ALT 17 - 63 U/L 14(L) - 10     CBC Latest Ref Rng & Units 12/18/2016 12/17/2016 09/09/2016  WBC 4.0 - 10.5 K/uL 31.9(H) 28.3(H) 11.1(H)  Hemoglobin 13.0 - 17.0 g/dL 13.1 13.2 12.7(L)  Hematocrit 39.0 - 52.0 % 38.7(L) 40.0 38.3(L)  Platelets 150 - 400 K/uL 208 232 302.0     ABG    Component Value Date/Time   HCO3 28.2 (H) 07/20/2016 1148   TCO2 29 07/20/2016 1148   O2SAT 69.0 07/20/2016 1148     CBG (last 3)   Recent Labs  12/17/16 1755 12/17/16 2135 12/18/16 0803  GLUCAP 141* 161* 151*     Imaging: Ct Abdomen Pelvis Wo Contrast  Result Date: 12/17/2016 CLINICAL DATA:  83 year old male with  history of lower pelvic pain. Evaluate for possible urinary tract calculus. EXAM: CT ABDOMEN AND PELVIS WITHOUT CONTRAST TECHNIQUE: Multidetector CT imaging of the abdomen and pelvis was performed following the standard protocol without IV contrast. COMPARISON:  CT the abdomen and pelvis 08/19/2016. FINDINGS: Lower chest: Mild scarring in the right lower lobe. Atherosclerotic calcifications left main, left anterior descending, left circumflex and right coronary arteries. Hepatobiliary: A few small calcified granulomas are noted in the liver. No definite suspicious cystic or solid hepatic lesions are noted on today's noncontrast CT examination. Gallbladder is nearly completely decompressed. Subtle inflammatory changes are noted adjacent to the gallbladder, without a focal fluid collection. Previously noted percutaneous cholecystostomy tube has been removed. Pancreas: No pancreatic mass or peripancreatic inflammatory changes are noted on today's noncontrast  CT examination. Spleen: Unremarkable. Adrenals/Urinary Tract: No calcifications are identified within the collecting system of either kidney, along the course of either ureter, or within the lumen of the urinary bladder. There is no hydroureteronephrosis to suggest urinary tract obstruction at this time. Unenhanced appearance of the right kidney and bilateral adrenal glands is normal. Exophytic low-attenuation lesion extending from the medial aspect of the lower pole of left kidney measuring 1.9 cm is incompletely characterized on today's noncontrast CT examination, but is statistically likely a tiny cyst. Urinary bladder is nearly completely decompressed, with Foley balloon catheter in place. Gas within the lumen of the urinary bladder is presumably iatrogenic. Stomach/Bowel: Unenhanced appearance of the stomach is normal. There is no pathologic dilatation of small bowel or colon. Numerous colonic diverticulae are noted, without surrounding inflammatory changes to  suggest an acute diverticulitis at this time. Normal appendix. Vascular/Lymphatic: Aortic atherosclerosis, without evidence of aneurysm in the abdominal or pelvic vasculature. No lymphadenopathy noted in the abdomen or pelvis. Reproductive: Prostate gland seminal vesicles are unremarkable in appearance. Other: No significant volume of ascites.  No pneumoperitoneum. Musculoskeletal: Chronic appearing compression fracture of superior endplate of L2 with approximately 30% loss of central vertebral body height. There are no aggressive appearing lytic or blastic lesions noted in the visualized portions of the skeleton. IMPRESSION: 1. No acute findings in the abdomen or pelvis. Specifically, no urinary tract calculi no findings of urinary tract obstruction are noted at this time. 2. Aortic atherosclerosis, in addition to left main and 3 vessel coronary artery disease. 3. Colonic diverticulosis without evidence of acute diverticulitis at this time. 4. Additional incidental findings, as above. Electronically Signed   By: Vinnie Langton M.D.   On: 12/17/2016 14:59   Dg Chest Port 1 View  Result Date: 12/18/2016 CLINICAL DATA:  Acute onset of sepsis.  Initial encounter. EXAM: PORTABLE CHEST 1 VIEW COMPARISON:  Chest radiograph performed 07/22/2016, and CTA of the chest performed 07/20/2016 FINDINGS: The lungs are hypoexpanded. Vascular congestion is noted. Bibasilar airspace opacities may reflect atelectasis or possibly mild pneumonia. There is no evidence of pleural effusion or pneumothorax. The cardiomediastinal silhouette is enlarged. No acute osseous abnormalities are seen. IMPRESSION: Lungs hypoexpanded. Vascular congestion and cardiomegaly. Bibasilar airspace opacities may reflect atelectasis or possibly mild pneumonia. Electronically Signed   By: Garald Balding M.D.   On: 12/18/2016 01:09     Assessment/plan:  Sepsis 2nd to UTI. - responding to IV fluids and lactic acid < 2 - continue IV fluids -  continue Abx - f/u cultures  Hx of cholecystitis. - CT abd/pelvis unremarkable  Acute kidney failure >> baseline creatinine 1.29 from 09/09/16. - continue IV fluids  DM. - per primary team  Hx of PE. - anticoagulation per primary team  Hx of OSA. - non compliant with CPAP  Hx of HTN. - hold outpt coreg, imdur until BP more stable - f/u Echo  Hx of PTSD, insomnia. - seroquel, trazodone  Updated pt's wife at bedside  CC time 31 minutes  Chesley Mires, MD [facility] Pulmonary/Critical Care 12/18/2016, 9:51 AM Pager:  [phone] After 3pm call: [phone]

## 2016-12-18 NOTE — Progress Notes (Signed)
Double checked with the patient and he has never been told that he has CHF. Pt. States he just got all of that checked out within the last 2 months and everything was fine.

## 2016-12-18 NOTE — Progress Notes (Signed)
PROGRESS NOTE    [name]  ZHG:[id number] DOB: [birth year] DOA: 12/17/2016 PCP: Annye Asa, MD   Brief Narrative: [name] is a 83 y.o. male with medical history of DM on Insulin, HLD, HTN, PE on Xarelto, Chronic pain. He presented with UTI and developed sepsis later that night of admission. He is on ceftriaxone as empiric coverage and cultures are pending.   Assessment & Plan:   Principal Problem:   Cystitis Active Problems:   Leukocytosis   Essential hypertension, benign   Chronic pain syndrome   Morbid obesity due to excess calories (HCC)   Type 2 diabetes with nephropathy (HCC)   AKI (acute kidney injury) ([place])   UTI (urinary tract infection)   Sepsis secondary to UTI 4.5L of NS so far. Hypotensive. -535ml bolus -continue IV fluids -consult CCM for possible pressor support -continue ceftriaxone -urine/blood cultures pending  Acute kidney injury Baseline creatinine of 0.7. Worsening secondary to significant dehydration -continue to hold lisinopril -IV fluids -recheck BMP  Essential hypertension Now hypotensive secondary to sepsis -hold coreg and lisinopril  Chronic pain syndrome -continue dilaudid  Type 2 diabetes mellitus with nephropathy -continue Levemir -continue SSI  DVT Wheelchair bound. -continue Xarelto    DVT prophylaxis: Xarelto Code Status: Full code Family Communication: None at bedside Disposition Plan: Discharge pending resolution of sepsis and stabilization   Consultants:   CCM  Procedures:   None  Antimicrobials:  Ceftriaxone    Subjective: Patient reports no pain, nausea or vomiting.  Objective: Vitals:   12/18/16 0403 12/18/16 0509 12/18/16 0600 12/18/16 0708  BP: (!) 87/32 (!) 83/25 (!) 84/37 (!) 79/39  Pulse: 94 98 93 96  Resp:      Temp:      TempSrc:      SpO2: 98% 97% 94% 95%  Weight:      Height:        Intake/Output Summary (Last 24 hours) at 12/18/16 0742 Last data filed at  12/18/16 0600  Gross per 24 hour  Intake          2388.75 ml  Output              335 ml  Net          2053.75 ml   Filed Weights   12/17/16 1952  Weight: 108.9 kg (240 lb)    Examination:  General exam: Appears calm and comfortable HEENT: oropharynx is tacky Respiratory system: Clear to auscultation. Respiratory effort normal. Cardiovascular system: S1 & S2 heard, RRR. No murmurs. 1+ radial pulses bilaterally. <2 second capillary refill Gastrointestinal system: Abdomen is nondistended, soft and nontender. Normal bowel sounds heard. Central nervous system: Alert and oriented. No focal neurological deficits. Extremities: No edema. No calf tenderness Skin: No cyanosis. No rashes Psychiatry: Judgement and insight appear normal. Mood & affect appropriate.     Data Reviewed: I have personally reviewed following labs and imaging studies  CBC:  Recent Labs Lab 12/17/16 1141 12/18/16 0002  WBC 28.3* 31.9*  NEUTROABS 22.9* 26.2*  HGB 13.2 13.1  HCT 40.0 38.7*  MCV 85.5 86.0  PLT 232 196   Basic Metabolic Panel:  Recent Labs Lab 12/17/16 1141 12/18/16 0002  NA 136 135  K 4.7 4.5  CL 101 102  CO2 28 23  GLUCOSE 165* 176*  BUN 24* 27*  CREATININE 1.52* 1.85*  CALCIUM 9.1 8.7*   GFR: Estimated Creatinine Clearance: 38.6 mL/min (A) (by C-G formula based on SCr of 1.85 mg/dL (H)). Liver  Function Tests:  Recent Labs Lab 12/18/16 0002  AST 25  ALT 14*  ALKPHOS 73  BILITOT 1.0  PROT 6.4*  ALBUMIN 3.1*   No results for input(s): LIPASE, AMYLASE in the last 168 hours. No results for input(s): AMMONIA in the last 168 hours. Coagulation Profile:  Recent Labs Lab 12/18/16 0002  INR 2.66   Cardiac Enzymes: No results for input(s): CKTOTAL, CKMB, CKMBINDEX, TROPONINI in the last 168 hours. BNP (last 3 results) No results for input(s): PROBNP in the last 8760 hours. HbA1C: No results for input(s): HGBA1C in the last 72 hours. CBG:  Recent Labs Lab  12/17/16 1720 12/17/16 1755 12/17/16 2135  GLUCAP 165* 141* 161*   Lipid Profile: No results for input(s): CHOL, HDL, LDLCALC, TRIG, CHOLHDL, LDLDIRECT in the last 72 hours. Thyroid Function Tests: No results for input(s): TSH, T4TOTAL, FREET4, T3FREE, THYROIDAB in the last 72 hours. Anemia Panel: No results for input(s): VITAMINB12, FOLATE, FERRITIN, TIBC, IRON, RETICCTPCT in the last 72 hours. Sepsis Labs:  Recent Labs Lab 12/18/16 0002 12/18/16 0009 12/18/16 0330  PROCALCITON 0.89  --   --   LATICACIDVEN  --  1.2 1.3    Recent Results (from the past 240 hour(s))  MRSA PCR Screening     Status: None   Collection Time: 12/18/16  2:11 AM  Result Value Ref Range Status   MRSA by PCR NEGATIVE NEGATIVE Final    Comment:        The GeneXpert MRSA Assay (FDA approved for NASAL specimens only), is one component of a comprehensive MRSA colonization surveillance program. It is not intended to diagnose MRSA infection nor to guide or monitor treatment for MRSA infections.          Radiology Studies: Ct Abdomen Pelvis Wo Contrast  Result Date: 12/17/2016 CLINICAL DATA:  83 year old male with history of lower pelvic pain. Evaluate for possible urinary tract calculus. EXAM: CT ABDOMEN AND PELVIS WITHOUT CONTRAST TECHNIQUE: Multidetector CT imaging of the abdomen and pelvis was performed following the standard protocol without IV contrast. COMPARISON:  CT the abdomen and pelvis 08/19/2016. FINDINGS: Lower chest: Mild scarring in the right lower lobe. Atherosclerotic calcifications left main, left anterior descending, left circumflex and right coronary arteries. Hepatobiliary: A few small calcified granulomas are noted in the liver. No definite suspicious cystic or solid hepatic lesions are noted on today's noncontrast CT examination. Gallbladder is nearly completely decompressed. Subtle inflammatory changes are noted adjacent to the gallbladder, without a focal fluid collection.  Previously noted percutaneous cholecystostomy tube has been removed. Pancreas: No pancreatic mass or peripancreatic inflammatory changes are noted on today's noncontrast CT examination. Spleen: Unremarkable. Adrenals/Urinary Tract: No calcifications are identified within the collecting system of either kidney, along the course of either ureter, or within the lumen of the urinary bladder. There is no hydroureteronephrosis to suggest urinary tract obstruction at this time. Unenhanced appearance of the right kidney and bilateral adrenal glands is normal. Exophytic low-attenuation lesion extending from the medial aspect of the lower pole of left kidney measuring 1.9 cm is incompletely characterized on today's noncontrast CT examination, but is statistically likely a tiny cyst. Urinary bladder is nearly completely decompressed, with Foley balloon catheter in place. Gas within the lumen of the urinary bladder is presumably iatrogenic. Stomach/Bowel: Unenhanced appearance of the stomach is normal. There is no pathologic dilatation of small bowel or colon. Numerous colonic diverticulae are noted, without surrounding inflammatory changes to suggest an acute diverticulitis at this time. Normal appendix. Vascular/Lymphatic: Aortic  atherosclerosis, without evidence of aneurysm in the abdominal or pelvic vasculature. No lymphadenopathy noted in the abdomen or pelvis. Reproductive: Prostate gland seminal vesicles are unremarkable in appearance. Other: No significant volume of ascites.  No pneumoperitoneum. Musculoskeletal: Chronic appearing compression fracture of superior endplate of L2 with approximately 30% loss of central vertebral body height. There are no aggressive appearing lytic or blastic lesions noted in the visualized portions of the skeleton. IMPRESSION: 1. No acute findings in the abdomen or pelvis. Specifically, no urinary tract calculi no findings of urinary tract obstruction are noted at this time. 2. Aortic  atherosclerosis, in addition to left main and 3 vessel coronary artery disease. 3. Colonic diverticulosis without evidence of acute diverticulitis at this time. 4. Additional incidental findings, as above. Electronically Signed   By: Vinnie Langton M.D.   On: 12/17/2016 14:59   Dg Chest Port 1 View  Result Date: 12/18/2016 CLINICAL DATA:  Acute onset of sepsis.  Initial encounter. EXAM: PORTABLE CHEST 1 VIEW COMPARISON:  Chest radiograph performed 07/22/2016, and CTA of the chest performed 07/20/2016 FINDINGS: The lungs are hypoexpanded. Vascular congestion is noted. Bibasilar airspace opacities may reflect atelectasis or possibly mild pneumonia. There is no evidence of pleural effusion or pneumothorax. The cardiomediastinal silhouette is enlarged. No acute osseous abnormalities are seen. IMPRESSION: Lungs hypoexpanded. Vascular congestion and cardiomegaly. Bibasilar airspace opacities may reflect atelectasis or possibly mild pneumonia. Electronically Signed   By: Garald Balding M.D.   On: 12/18/2016 01:09        Scheduled Meds: . carvedilol  3.125 mg Oral BID WC  . docusate sodium  200 mg Oral BID  . galantamine  8 mg Oral BID WC  . insulin aspart  0-5 Units Subcutaneous QHS  . insulin aspart  0-9 Units Subcutaneous TID WC  . insulin detemir  20 Units Subcutaneous QHS  . isosorbide mononitrate  30 mg Oral Daily  . magnesium oxide  400 mg Oral BID  . pantoprazole  40 mg Oral Daily  . polyethylene glycol  17 g Oral Daily  . QUEtiapine  50 mg Oral QHS  . rivaroxaban  20 mg Oral Q supper  . tamsulosin  0.4 mg Oral QPC supper  . traZODone  50 mg Oral QHS   Continuous Infusions: . sodium chloride 75 mL/hr at 12/17/16 2302  . cefTRIAXone (ROCEPHIN)  IV Stopped (12/18/16 0255)  . sodium chloride       LOS: 0 days     Cordelia Poche, MD [facility] 12/18/2016, 7:42 AM Pager: [phone]  If 7PM-7AM, please contact night-coverage [email or web address] Password [facility] 12/18/2016,  7:42 AM
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
## 2016-12-18 NOTE — Progress Notes (Signed)
Notified MD of BP of 83/25 even after 1L bolus. New orders given. Will continue to monitor.

## 2016-12-18 NOTE — Progress Notes (Signed)
Md. Notified, ordered a 2nd liter bolus of NS and he is going to put orders in for labs and is going to come up to see the pt.

## 2016-12-19 LAB — BASIC METABOLIC PANEL
Anion gap: 6 (ref 5–15)
BUN: 26 mg/dL — AB (ref 6–20)
CALCIUM: 8.2 mg/dL — AB (ref 8.9–10.3)
CO2: 25 mmol/L (ref 22–32)
Chloride: 105 mmol/L (ref 101–111)
Creatinine, Ser: 1.24 mg/dL (ref 0.61–1.24)
GFR calc Af Amer: >60 mL/min (ref >60)
GFR calc non Af Amer: 52 mL/min — ABNORMAL LOW (ref >60)
Glucose, Bld: 106 mg/dL — ABNORMAL HIGH (ref 65–99)
Potassium: 4.4 mmol/L (ref 3.5–5.1)
SODIUM: 136 mmol/L (ref 135–145)

## 2016-12-19 LAB — CBC
HCT: 35.5 % — ABNORMAL LOW (ref 39.0–52.0)
Hemoglobin: 11.3 g/dL — ABNORMAL LOW (ref 13.0–17.0)
MCH: 27 pg (ref 26.0–34.0)
MCHC: 31.8 g/dL (ref 30.0–36.0)
MCV: 84.9 fL (ref 78.0–100.0)
PLATELETS: 205 10*3/uL (ref 150–400)
RBC: 4.18 MIL/uL — ABNORMAL LOW (ref 4.22–5.81)
RDW: 15.5 % (ref 11.5–15.5)
WBC: 18.7 10*3/uL — ABNORMAL HIGH (ref 4.0–10.5)

## 2016-12-19 LAB — GLUCOSE, CAPILLARY
GLUCOSE-CAPILLARY: 104 mg/dL — AB (ref 65–99)
GLUCOSE-CAPILLARY: 131 mg/dL — AB (ref 65–99)
Glucose-Capillary: 134 mg/dL — ABNORMAL HIGH (ref 65–99)
Glucose-Capillary: 185 mg/dL — ABNORMAL HIGH (ref 65–99)

## 2016-12-19 MED ORDER — HYDROMORPHONE HCL 4 MG/ML IJ SOLN
1.0000 mg | Freq: Once | INTRAMUSCULAR | Status: AC
  Administered 2016-12-20: 1 mg via INTRAVENOUS
  Filled 2016-12-19 (×2): qty 1

## 2016-12-19 MED ORDER — PHENAZOPYRIDINE HCL 200 MG PO TABS
200.0000 mg | ORAL_TABLET | Freq: Three times a day (TID) | ORAL | Status: DC
  Administered 2016-12-19 – 2016-12-20 (×5): 200 mg via ORAL
  Filled 2016-12-19 (×7): qty 1

## 2016-12-19 NOTE — Progress Notes (Signed)
Pt refusing to wear oxygen stating "I'm not sick I just don't feel good" and "I don't wear it at home I don't need ti here." Oxygen saturation at 94%on room air. Will continue to monitor.

## 2016-12-19 NOTE — Care Management Note (Signed)
Case Management Note  Patient Details  Name: [name] MRN: [id number] Date of Birth: [birth year]  Subjective/Objective:    urosepsis                Action/Plan:Date:  December 19, 2016 Chart reviewed for concurrent status and case management needs. Will continue to follow patient progress. Discharge Planning: following for needs Expected discharge date: [id number] Velva Harman, BSN, [place], [place]  Expected Discharge Date:  12/20/16               Expected Discharge Plan:  Home/Self Care  In-House Referral:     Discharge planning Services     Post Acute Care Choice:    Choice offered to:     DME Arranged:    DME Agency:     HH Arranged:    HH Agency:     Status of Service:  In process, will continue to follow  If discussed at Long Length of Stay Meetings, dates discussed:    Additional Comments:  Leeroy Cha, RN 12/19/2016, 10:12 AM

## 2016-12-19 NOTE — Progress Notes (Signed)
PROGRESS NOTE    [name]  ZLD:[id number] DOB: Oct 04, 1931 DOA: 12/17/2016 PCP: Annye Asa, MD   Brief Narrative: [name] is a 83 y.o. male with medical history of DM on Insulin, HLD, HTN, PE on Xarelto, Chronic pain. He presented with UTI and developed sepsis later that night of admission. He is on ceftriaxone as empiric coverage and cultures are pending.   Assessment & Plan:   Principal Problem:   Cystitis Active Problems:   Leukocytosis   Essential hypertension, benign   Chronic pain syndrome   Morbid obesity due to excess calories (HCC)   Type 2 diabetes with nephropathy (HCC)   Sepsis ([place])   AKI (acute kidney injury) ([place])   UTI (urinary tract infection)   Sepsis secondary to UTI Complicated UTI No risk factors for UTI. Received 5L NS boluses. CCM called but patient finally responded to last bolus. WBC improving. -continue IV fluids -continue ceftriaxone -urine/blood cultures pending -pyridium for symptoms relief  Acute kidney injury Baseline creatinine of 0.7. Improved with IV fluids -IV fluids  Essential hypertension Hypotensive secondary to sepsis. -hold coreg and lisinopril  Chronic pain syndrome -continue dilaudid  Type 2 diabetes mellitus with nephropathy -continue Levemir 20 units qHS -continue SSI  DVT Wheelchair bound. -continue Xarelto    DVT prophylaxis: Xarelto Code Status: Full code Family Communication: None at bedside Disposition Plan: Discharge pending resolution of sepsis and stabilization   Consultants:   CCM  Procedures:   None  Antimicrobials:  Ceftriaxone    Subjective: Dysuria. No chest pain or dyspnea.  Objective: Vitals:   12/19/16 0400 12/19/16 0500 12/19/16 0600 12/19/16 0700  BP: (!) 140/50 124/62 140/61 (!) 142/56  Pulse: 96 98 96 94  Resp: (!) 22 (!) 26 (!) 24 (!) 22  Temp: 100.2 F (37.9 C)     TempSrc: Oral     SpO2: 95% 99% 100% 100%  Weight:      Height:         Intake/Output Summary (Last 24 hours) at 12/19/16 0801 Last data filed at 12/19/16 0700  Gross per 24 hour  Intake           3267.6 ml  Output             1490 ml  Net           1777.6 ml   Filed Weights   12/17/16 1952  Weight: 108.9 kg (240 lb)    Examination:  General exam: Appears calm and comfortable HEENT: oropharynx is tacky Respiratory system: Clear to auscultation. Respiratory effort normal. Cardiovascular system: S1 & S2 heard, RRR. No murmurs. 2+ radial pulses Gastrointestinal system: Abdomen is nondistended, soft and nontender. Normal bowel sounds heard. Central nervous system: Alert and oriented. No focal neurological deficits. Extremities: No edema. No calf tenderness Skin: No cyanosis. No rashes Psychiatry: Judgement and insight appear normal. Mood & affect appropriate.     Data Reviewed: I have personally reviewed following labs and imaging studies  CBC:  Recent Labs Lab 12/17/16 1141 12/18/16 0002 12/19/16 0341  WBC 28.3* 31.9* 18.7*  NEUTROABS 22.9* 26.2*  --   HGB 13.2 13.1 11.3*  HCT 40.0 38.7* 35.5*  MCV 85.5 86.0 84.9  PLT 232 208 903   Basic Metabolic Panel:  Recent Labs Lab 12/17/16 1141 12/18/16 0002 12/19/16 0341  NA 136 135 136  K 4.7 4.5 4.4  CL 101 102 105  CO2 28 23 25   GLUCOSE 165* 176* 106*  BUN 24* 27*  26*  CREATININE 1.52* 1.85* 1.24  CALCIUM 9.1 8.7* 8.2*   GFR: Estimated Creatinine Clearance: 57.6 mL/min (by C-G formula based on SCr of 1.24 mg/dL). Liver Function Tests:  Recent Labs Lab 12/18/16 0002  AST 25  ALT 14*  ALKPHOS 73  BILITOT 1.0  PROT 6.4*  ALBUMIN 3.1*   No results for input(s): LIPASE, AMYLASE in the last 168 hours. No results for input(s): AMMONIA in the last 168 hours. Coagulation Profile:  Recent Labs Lab 12/18/16 0002  INR 2.66   Cardiac Enzymes: No results for input(s): CKTOTAL, CKMB, CKMBINDEX, TROPONINI in the last 168 hours. BNP (last 3 results) No results for input(s):  PROBNP in the last 8760 hours. HbA1C: No results for input(s): HGBA1C in the last 72 hours. CBG:  Recent Labs Lab 12/18/16 0803 12/18/16 1134 12/18/16 1702 12/18/16 2110 12/19/16 0746  GLUCAP 151* 130* 165* 168* 104*   Lipid Profile: No results for input(s): CHOL, HDL, LDLCALC, TRIG, CHOLHDL, LDLDIRECT in the last 72 hours. Thyroid Function Tests: No results for input(s): TSH, T4TOTAL, FREET4, T3FREE, THYROIDAB in the last 72 hours. Anemia Panel: No results for input(s): VITAMINB12, FOLATE, FERRITIN, TIBC, IRON, RETICCTPCT in the last 72 hours. Sepsis Labs:  Recent Labs Lab 12/18/16 0002 12/18/16 0009 12/18/16 0330  PROCALCITON 0.89  --   --   LATICACIDVEN  --  1.2 1.3    Recent Results (from the past 240 hour(s))  Culture, blood (routine x 2)     Status: None (Preliminary result)   Collection Time: 12/17/16  3:39 PM  Result Value Ref Range Status   Specimen Description BLOOD LEFT ANTECUBITAL  Final   Special Requests   Final    BOTTLES DRAWN AEROBIC AND ANAEROBIC Blood Culture adequate volume   Culture   Final    NO GROWTH < 24 HOURS Performed at [facility], [address], [place], [place] [address]    Report Status PENDING  Incomplete  Culture, blood (routine x 2)     Status: None (Preliminary result)   Collection Time: 12/17/16  3:40 PM  Result Value Ref Range Status   Specimen Description BLOOD RIGHT HAND  Final   Special Requests IN PEDIATRIC BOTTLE Blood Culture adequate volume  Final   Culture   Final    NO GROWTH < 24 HOURS Performed at [facility], [place] [address] [place], [place] [address]    Report Status PENDING  Incomplete  MRSA PCR Screening     Status: None   Collection Time: 12/18/16  2:11 AM  Result Value Ref Range Status   MRSA by PCR NEGATIVE NEGATIVE Final    Comment:        The GeneXpert MRSA Assay (FDA approved for NASAL specimens only), is one component of a comprehensive MRSA colonization surveillance program. It is  not intended to diagnose MRSA infection nor to guide or monitor treatment for MRSA infections.          Radiology Studies: Ct Abdomen Pelvis Wo Contrast  Result Date: 12/17/2016 CLINICAL DATA:  83 year old male with history of lower pelvic pain. Evaluate for possible urinary tract calculus. EXAM: CT ABDOMEN AND PELVIS WITHOUT CONTRAST TECHNIQUE: Multidetector CT imaging of the abdomen and pelvis was performed following the standard protocol without IV contrast. COMPARISON:  CT the abdomen and pelvis 08/19/2016. FINDINGS: Lower chest: Mild scarring in the right lower lobe. Atherosclerotic calcifications left main, left anterior descending, left circumflex and right coronary arteries. Hepatobiliary: A few small calcified  granulomas are noted in the liver. No definite suspicious cystic or solid hepatic lesions are noted on today's noncontrast CT examination. Gallbladder is nearly completely decompressed. Subtle inflammatory changes are noted adjacent to the gallbladder, without a focal fluid collection. Previously noted percutaneous cholecystostomy tube has been removed. Pancreas: No pancreatic mass or peripancreatic inflammatory changes are noted on today's noncontrast CT examination. Spleen: Unremarkable. Adrenals/Urinary Tract: No calcifications are identified within the collecting system of either kidney, along the course of either ureter, or within the lumen of the urinary bladder. There is no hydroureteronephrosis to suggest urinary tract obstruction at this time. Unenhanced appearance of the right kidney and bilateral adrenal glands is normal. Exophytic low-attenuation lesion extending from the medial aspect of the lower pole of left kidney measuring 1.9 cm is incompletely characterized on today's noncontrast CT examination, but is statistically likely a tiny cyst. Urinary bladder is nearly completely decompressed, with Foley balloon catheter in place. Gas within the lumen of the urinary bladder  is presumably iatrogenic. Stomach/Bowel: Unenhanced appearance of the stomach is normal. There is no pathologic dilatation of small bowel or colon. Numerous colonic diverticulae are noted, without surrounding inflammatory changes to suggest an acute diverticulitis at this time. Normal appendix. Vascular/Lymphatic: Aortic atherosclerosis, without evidence of aneurysm in the abdominal or pelvic vasculature. No lymphadenopathy noted in the abdomen or pelvis. Reproductive: Prostate gland seminal vesicles are unremarkable in appearance. Other: No significant volume of ascites.  No pneumoperitoneum. Musculoskeletal: Chronic appearing compression fracture of superior endplate of L2 with approximately 30% loss of central vertebral body height. There are no aggressive appearing lytic or blastic lesions noted in the visualized portions of the skeleton. IMPRESSION: 1. No acute findings in the abdomen or pelvis. Specifically, no urinary tract calculi no findings of urinary tract obstruction are noted at this time. 2. Aortic atherosclerosis, in addition to left main and 3 vessel coronary artery disease. 3. Colonic diverticulosis without evidence of acute diverticulitis at this time. 4. Additional incidental findings, as above. Electronically Signed   By: Vinnie Langton M.D.   On: 12/17/2016 14:59   Dg Chest Port 1 View  Result Date: 12/18/2016 CLINICAL DATA:  Acute onset of sepsis.  Initial encounter. EXAM: PORTABLE CHEST 1 VIEW COMPARISON:  Chest radiograph performed 07/22/2016, and CTA of the chest performed 07/20/2016 FINDINGS: The lungs are hypoexpanded. Vascular congestion is noted. Bibasilar airspace opacities may reflect atelectasis or possibly mild pneumonia. There is no evidence of pleural effusion or pneumothorax. The cardiomediastinal silhouette is enlarged. No acute osseous abnormalities are seen. IMPRESSION: Lungs hypoexpanded. Vascular congestion and cardiomegaly. Bibasilar airspace opacities may reflect  atelectasis or possibly mild pneumonia. Electronically Signed   By: Garald Balding M.D.   On: 12/18/2016 01:09        Scheduled Meds: . docusate sodium  200 mg Oral BID  . galantamine  8 mg Oral BID WC  . insulin aspart  0-5 Units Subcutaneous QHS  . insulin aspart  0-9 Units Subcutaneous TID WC  . insulin detemir  20 Units Subcutaneous QHS  . magnesium oxide  400 mg Oral BID  . pantoprazole  40 mg Oral Daily  . phenazopyridine  200 mg Oral TID WC  . polyethylene glycol  17 g Oral Daily  . QUEtiapine  50 mg Oral QHS  . rivaroxaban  20 mg Oral Q supper  . tamsulosin  0.4 mg Oral QPC supper  . traZODone  50 mg Oral QHS   Continuous Infusions: . sodium chloride 75 mL/hr at 12/18/16  2130  . cefTRIAXone (ROCEPHIN)  IV 2 g (12/19/16 0600)  . norepinephrine Stopped (12/18/16 2100)  . sodium chloride       LOS: 1 day     Cordelia Poche, MD [facility] 12/19/2016, 8:01 AM Pager: [phone]  If 7PM-7AM, please contact night-coverage [email or web address] Password [email or web address] 12/19/2016, 8:01 AM

## 2016-12-19 NOTE — Progress Notes (Signed)
[place] [facility] Attending Note  Presenting HPI:  83 y.o. male developed scrotal pain and difficulty passing urine.  He felt full bladder.  Started on Abx for cystitis.  Had persistent hypotension and given several IV fluids boluses.  CT abd/pelvis was unremarkable. PCCM was consulted to assist with sepsis assessment.   Subjective:  Patient briefly on vasopressor support yesterday. Weaned off vasopressor requirement last night. Patient continuing to have significant dysuria with urination. Denies any abdominal pain or nausea otherwise. Reports mild subjective fever overnight but no chills or sweats.  Review of Systems:  Denies any chest pain or pressure. No dyspnea or cough. No headache or vision changes.  Temp:  [98.2 F (36.8 C)-100.2 F (37.9 C)] 100.2 F (37.9 C) (04/23 0800) Pulse Rate:  [79-98] 90 (04/23 0900) Resp:  [14-26] 22 (04/23 0900) BP: (80-143)/(36-86) 142/48 (04/23 0900) SpO2:  [95 %-100 %] 97 % (04/23 0900)  General:  Awake. No distress. Alert. Obese. Wife at bedside. Integument:  Warm & dry. No rash or bruising on exposed skin. HEENT:  No scleral icterus or injection. Pupils symmetric.  Pulmonary:  Clear bilaterally to auscultation. No accessory muscle use on room air. Good aeration bilaterally.  Cardiovascular:  Regular rate & rhythm. Unable to appreciate any JVD given body habitus. Abdomen:  Soft. Protuberant. Nontender. Normal bowel sounds. Neurological:  Cranial nerves grossly in tact. No meningismus.   CBC Latest Ref Rng & Units 12/19/2016 12/18/2016 12/17/2016  WBC 4.0 - 10.5 K/uL 18.7(H) 31.9(H) 28.3(H)  Hemoglobin 13.0 - 17.0 g/dL 11.3(L) 13.1 13.2  Hematocrit 39.0 - 52.0 % 35.5(L) 38.7(L) 40.0  Platelets 150 - 400 K/uL 205 208 232   BMP Latest Ref Rng & Units 12/19/2016 12/18/2016 12/17/2016  Glucose 65 - 99 mg/dL 106(H) 176(H) 165(H)  BUN 6 - 20 mg/dL 26(H) 27(H) 24(H)  Creatinine 0.61 - 1.24 mg/dL 1.24 1.85(H) 1.52(H)  Sodium 135 - 145 mmol/L  136 135 136  Potassium 3.5 - 5.1 mmol/L 4.4 4.5 4.7  Chloride 101 - 111 mmol/L 105 102 101  CO2 22 - 32 mmol/L 25 23 28   Calcium 8.9 - 10.3 mg/dL 8.2(L) 8.7(L) 9.1    IMAGING/STUDIES: CT ABD/PELVIS W/O 4/21: IMPRESSION: 1. No acute findings in the abdomen or pelvis. Specifically, no urinary tract calculi no findings of urinary tract obstruction are noted at this time. 2. Aortic atherosclerosis, in addition to left main and 3 vessel coronary artery disease. 3. Colonic diverticulosis without evidence of acute diverticulitis at this time. PORT CXR 4/21:  Personally reviewed by me. Low lung volumes. Significantly low diuretic view. Unable to appreciate basilar opacities commented upon by radiology. No focal consolidation appreciated.  MICROBIOLOGY: MRSA PCR 4/22:  Negative Blood Cultures x2 4/21 >>> Urine Culture 4/21 >>> GNRs  ANTIBIOTICS: Rocephin 4/21 >>>  ASSESSMENT/PLAN:  83 y.o. Male admitted with septic shock. Shock has resolved overnight. Weaned off vasopressor support around 8:30 PM. Patient did undergo aggressive fluid resuscitation. Source seems to be a urinary tract infection given the gram-negative rods seen on his urine culture. However, blood cultures are pending.  1. Sepsis with septic shock: Weaned off of vasopressor support with resolution of shock. Aggressively fluid resuscitated. Recommend continuing to hold antihypertensive regimen. Awaiting culture results. Trending Procalcitonin per algorithm. 2. Gram-negative rod UTI: Currently on Rocephin. Awaiting urine culture results. Blood cultures pending.  I have spent a total of 37 minutes of time today caring for the patient, reviewing the patient's electronic medical record, and with more than  50% of that time spent coordinating care with the patient as well as reviewing the continuing plan of care with the patient at bedside.  Remainder of care as per primary service. PCCM signing off at this time. Please let me know if we  can be of any further assistance in this patient's care.   Sonia Baller Ashok Cordia, M.D. [facility] Pager:  [phone] After 3pm or if no response, call [phone] 9:54 AM 12/19/16

## 2016-12-20 DIAGNOSIS — A4151 Sepsis due to Escherichia coli [E. coli]: Secondary | ICD-10-CM

## 2016-12-20 DIAGNOSIS — R52 Pain, unspecified: Secondary | ICD-10-CM

## 2016-12-20 LAB — URINE CULTURE

## 2016-12-20 LAB — BASIC METABOLIC PANEL
Anion gap: 8 (ref 5–15)
BUN: 21 mg/dL — AB (ref 6–20)
CHLORIDE: 106 mmol/L (ref 101–111)
CO2: 24 mmol/L (ref 22–32)
CREATININE: 0.98 mg/dL (ref 0.61–1.24)
Calcium: 8.6 mg/dL — ABNORMAL LOW (ref 8.9–10.3)
GFR calc Af Amer: >60 mL/min (ref >60)
GFR calc non Af Amer: >60 mL/min (ref >60)
Glucose, Bld: 116 mg/dL — ABNORMAL HIGH (ref 65–99)
Potassium: 4.3 mmol/L (ref 3.5–5.1)
SODIUM: 138 mmol/L (ref 135–145)

## 2016-12-20 LAB — CBC
HCT: 37.1 % — ABNORMAL LOW (ref 39.0–52.0)
HEMOGLOBIN: 12.2 g/dL — AB (ref 13.0–17.0)
MCH: 28.3 pg (ref 26.0–34.0)
MCHC: 32.9 g/dL (ref 30.0–36.0)
MCV: 86.1 fL (ref 78.0–100.0)
Platelets: 201 10*3/uL (ref 150–400)
RBC: 4.31 MIL/uL (ref 4.22–5.81)
RDW: 15.3 % (ref 11.5–15.5)
WBC: 8.2 10*3/uL (ref 4.0–10.5)

## 2016-12-20 LAB — GLUCOSE, CAPILLARY
Glucose-Capillary: 108 mg/dL — ABNORMAL HIGH (ref 65–99)
Glucose-Capillary: 127 mg/dL — ABNORMAL HIGH (ref 65–99)

## 2016-12-20 MED ORDER — CEFUROXIME AXETIL 500 MG PO TABS: 500.0000 mg | ORAL_TABLET | Freq: Two times a day (BID) | ORAL | Status: DC

## 2016-12-20 MED ORDER — CEFPODOXIME PROXETIL 200 MG PO TABS: 200.0000 mg | ORAL_TABLET | Freq: Two times a day (BID) | ORAL | 0 refills | Status: AC

## 2016-12-20 MED ORDER — CEFPODOXIME PROXETIL 200 MG PO TABS: 200.0000 mg | ORAL_TABLET | Freq: Two times a day (BID) | ORAL | Status: DC

## 2016-12-20 NOTE — Evaluation (Signed)
Physical Therapy Evaluation Patient Details Name: LAMONDRE WESCHE MRN: [id number] DOB: 11/26/1931 Today's Date: 12/20/2016   History of Present Illness  83 yo male admitted with sepsis, UTI. Hx of DM, HTN, PE, PTSD, morbid obesity, chronic pain/arthritis bil knees, sleep apnea, multiple falls.   Clinical Impression  On eval, pt required Min guard assist for mobility. Therapist set up recliner, per pt preference, for bed>chair transfer. Increased time required but pt was able to perform transfer without any physical assist from therapist. Pt repeatedly stated "let me do it." Wife present during session. Both pt and wife report they exercise caution with all mobility/ADLs so as to prevent falls. Discussed d/c plan-pt will return home with wife. Pt declines HHPT follow up. Made RN aware of completion of eval and recommendations.     Follow Up Recommendations No PT follow up;Supervision/Assistance - 24 hour    Equipment Recommendations  None recommended by PT    Recommendations for Other Services       Precautions / Restrictions Precautions Precautions: Fall Restrictions Weight Bearing Restrictions: No      Mobility  Bed Mobility Overal bed mobility: Modified Independent             General bed mobility comments: Increased time. Pt used bedrail.  Transfers Overall transfer level: Needs assistance   Transfers: Squat Pivot Transfers     Squat pivot transfers: Min guard     General transfer comment: close guard for safety. Pt repeatedly stated "Let me do it." Bed>recliner with use of bil armrests for support.   Ambulation/Gait             General Gait Details: NT-pt nonambulatory  Stairs            Wheelchair Mobility    Modified Rankin (Stroke Patients Only)       Balance Overall balance assessment: History of Falls;Needs assistance           Standing balance-Leahy Scale: Poor                               Pertinent Vitals/Pain  Pain Assessment: Faces Faces Pain Scale: Hurts even more Pain Location: bil knees -chronic pain Pain Intervention(s): Limited activity within patient's tolerance;Repositioned    Home Living Family/patient expects to be discharged to:: Private residence Living Arrangements: Spouse/significant other Available Help at Discharge: Family;Available 24 hours/day Type of Home: Apartment Home Access: Level entry     Home Layout: One level Home Equipment: Bedside commode;Cane - single point;Walker - 2 wheels;Walker - 4 wheels;Wheelchair - Education officer, community - power;Tub bench      Prior Function Level of Independence: Independent with assistive device(s)         Comments: Per pt, mod Ind with WC transfers     Hand Dominance        Extremity/Trunk Assessment   Upper Extremity Assessment Upper Extremity Assessment: Overall WFL for tasks assessed    Lower Extremity Assessment Lower Extremity Assessment: Generalized weakness    Cervical / Trunk Assessment Cervical / Trunk Assessment: Normal  Communication   Communication: HOH  Cognition Arousal/Alertness: Awake/alert Behavior During Therapy: WFL for tasks assessed/performed Overall Cognitive Status: Within Functional Limits for tasks assessed                                        General Comments  Exercises     Assessment/Plan    PT Assessment Patent does not need any further PT services  PT Problem List         PT Treatment Interventions      PT Goals (Current goals can be found in the Care Plan section)  Acute Rehab PT Goals Patient Stated Goal: home today PT Goal Formulation: All assessment and education complete, DC therapy    Frequency     Barriers to discharge        Co-evaluation               End of Session   Activity Tolerance: Patient limited by pain (chronic bil knee pain) Patient left: in chair;with call bell/phone within reach;with family/visitor present Nurse  Communication:  (made NT aware that wife was going to assist pt with dressing-encouraged supervision for safety) PT Visit Diagnosis: Muscle weakness (generalized) (M62.81)    Time: 9702-6378 PT Time Calculation (min) (ACUTE ONLY): 25 min   Charges:   PT Evaluation $PT Eval Low Complexity: 1 Procedure PT Treatments $Therapeutic Activity: 8-22 mins   PT G Codes:          Weston Anna, MPT Pager: [phone]

## 2016-12-20 NOTE — Discharge Summary (Signed)
Physician Discharge Summary  [name] JQB:[id number] DOB: [birth year] DOA: 12/17/2016  PCP: Annye Asa, MD  Admit date: 12/17/2016 Discharge date: 12/20/2016  Admitted From: Home Disposition: Home  Recommendations for Outpatient Follow-up:  1. Follow up with PCP in 1 week  Home Health: None recommended Equipment/Devices: None provided  Discharge Condition: Stable CODE STATUS: Full code Diet recommendation: Carb modified   Brief/Interim Summary:  Admission HPI written by Debbe Odea, MD   Chief Complaint: dysuria  HPI: [name] is a 83 y.o. male with medical history of DM on Insulin, HLD, HTN, PE on Xarelto, Chronic pain who is wheelchair bound due to "bad knees" presents for pain with micturition which started yesterday. He states he feels that he has gallons of urine in his bladder but when he goes to urinate, only a few drops come out. No blood or pus noted in urine. He has never had a UTI or prostate issues before. No fever or chills. No flank pain.   ED Course:  Temp 100.3 WBC count 28.3 BUN 24, Cr 1.52 UA +    Hospital course:  Sepsis secondary to UTI Complicated UTI No risk factors for UTI. Received 5L NS boluses for blood pressure support. Ceftriaxone started for empiric coverage of UTI. WBC improved with treatment. Urine culture significant for pan-sensitive E. Coli. Pyridium provided for symptom relief. Discharged on Vantin.  Acute kidney injury Baseline creatinine of 0.7. Worsened to a creatinine of 1.85 before improving to baseline with continued IV fluids. Secondary to significant dehydration.  Essential hypertension Hypotensive secondary to sepsis initially. Coreg and lisinopril held at that time. Blood pressure improved with resolution of sepsis, however, blood pressures are normotensive. Discontinue Coreg and lisinopril at discharge.  Chronic pain syndrome Continued home dilaudid  Type 2 diabetes mellitus with  nephropathy Continued Levemir 20 units qHS and started on sliding scale while inpatient.  DVT Wheelchair bound. Continued Xarelto.  Discharge Diagnoses:  Principal Problem:   Sepsis secondary to UTI [facility]) Active Problems:   Leukocytosis   Essential hypertension, benign   Chronic pain syndrome   Morbid obesity due to excess calories (HCC)   Type 2 diabetes with nephropathy ([place])   AKI (acute kidney injury) ([place])   UTI (urinary tract infection)   Cystitis    Discharge Instructions   Allergies as of 12/20/2016      Reactions   Oxycontin [oxycodone Hcl] Other (See Comments)   Pt states he cant sleep w/ this med; also makes him constipated.       Medication List    STOP taking these medications   carvedilol 3.125 MG tablet Commonly known as:  COREG   isosorbide mononitrate 30 MG 24 hr tablet Commonly known as:  IMDUR   lisinopril 20 MG tablet Commonly known as:  PRINIVIL,ZESTRIL     TAKE these medications   albuterol (2.5 MG/3ML) 0.083% nebulizer solution Commonly known as:  PROVENTIL Take 3 mLs (2.5 mg total) by nebulization every 6 (six) hours as needed for wheezing or shortness of breath.   cefpodoxime 200 MG tablet Commonly known as:  VANTIN Take 1 tablet (200 mg total) by mouth every 12 (twelve) hours. Start taking on:  12/21/2016   cyanocobalamin 500 MCG tablet Take 500 mcg by mouth 2 (two) times daily.   docusate sodium 100 MG capsule Commonly known as:  COLACE Take 200 mg by mouth 2 (two) times daily.   galantamine 16 MG 24 hr capsule Commonly known as:  RAZADYNE ER Take 16  mg by mouth daily with breakfast.   HUMALOG 100 UNIT/ML injection Generic drug:  insulin lispro Inject 0-5 Units into the skin 3 (three) times daily before meals. 0-200 = 0 units, 201-250 = 2 units, 251-300 = 3 units, 301-350 = 4 units, 351-400 = 5 units, >401 call MD/NP   HYDROmorphone 2 MG tablet Commonly known as:  DILAUDID Take 1 tablet (2 mg total) by mouth every 6 (six)  hours as needed for severe pain.   insulin detemir 100 UNIT/ML injection Commonly known as:  LEVEMIR Inject 0.2 mLs (20 Units total) into the skin at bedtime. What changed:  Another medication with the same name was removed. Continue taking this medication, and follow the directions you see here.   magnesium oxide 400 MG tablet Commonly known as:  MAG-OX Take 400 mg by mouth 2 (two) times daily.   Melatonin 5 MG Tabs Take 5 mg by mouth at bedtime.   methocarbamol 750 MG tablet Commonly known as:  ROBAXIN Take 1 tablet (750 mg total) by mouth every 6 (six) hours as needed for muscle spasms.   NUTRITIONAL SUPPLEMENT Liqd Take 120 mLs by mouth 2 (two) times daily.   pantoprazole 40 MG tablet Commonly known as:  PROTONIX Take 1 tablet (40 mg total) by mouth daily.   polyethylene glycol packet Commonly known as:  MIRALAX / GLYCOLAX Take 17 g by mouth daily.   PRESERVISION AREDS 2 PO Take 1 capsule by mouth 2 (two) times daily.   QUEtiapine 50 MG tablet Commonly known as:  SEROQUEL Take 50 mg by mouth at bedtime.   rivaroxaban 20 MG Tabs tablet Commonly known as:  XARELTO Take 1 tablet (20 mg total) by mouth daily.   rosuvastatin 20 MG tablet Commonly known as:  CRESTOR Take 20 mg by mouth daily.   tamsulosin 0.4 MG Caps capsule Commonly known as:  FLOMAX Take 1 capsule (0.4 mg total) by mouth daily after supper.   traZODone 50 MG tablet Commonly known as:  DESYREL Take 50 mg by mouth at bedtime.   Vitamin D3 2000 units capsule Take 2,000 Units by mouth daily.       Allergies  Allergen Reactions  . Oxycontin [Oxycodone Hcl] Other (See Comments)    Pt states he cant sleep w/ this med; also makes him constipated.     Consultations:  None   Procedures/Studies: Ct Abdomen Pelvis Wo Contrast  Result Date: 12/17/2016 CLINICAL DATA:  83 year old male with history of lower pelvic pain. Evaluate for possible urinary tract calculus. EXAM: CT ABDOMEN AND PELVIS  WITHOUT CONTRAST TECHNIQUE: Multidetector CT imaging of the abdomen and pelvis was performed following the standard protocol without IV contrast. COMPARISON:  CT the abdomen and pelvis 08/19/2016. FINDINGS: Lower chest: Mild scarring in the right lower lobe. Atherosclerotic calcifications left main, left anterior descending, left circumflex and right coronary arteries. Hepatobiliary: A few small calcified granulomas are noted in the liver. No definite suspicious cystic or solid hepatic lesions are noted on today's noncontrast CT examination. Gallbladder is nearly completely decompressed. Subtle inflammatory changes are noted adjacent to the gallbladder, without a focal fluid collection. Previously noted percutaneous cholecystostomy tube has been removed. Pancreas: No pancreatic mass or peripancreatic inflammatory changes are noted on today's noncontrast CT examination. Spleen: Unremarkable. Adrenals/Urinary Tract: No calcifications are identified within the collecting system of either kidney, along the course of either ureter, or within the lumen of the urinary bladder. There is no hydroureteronephrosis to suggest urinary tract obstruction at this time.  Unenhanced appearance of the right kidney and bilateral adrenal glands is normal. Exophytic low-attenuation lesion extending from the medial aspect of the lower pole of left kidney measuring 1.9 cm is incompletely characterized on today's noncontrast CT examination, but is statistically likely a tiny cyst. Urinary bladder is nearly completely decompressed, with Foley balloon catheter in place. Gas within the lumen of the urinary bladder is presumably iatrogenic. Stomach/Bowel: Unenhanced appearance of the stomach is normal. There is no pathologic dilatation of small bowel or colon. Numerous colonic diverticulae are noted, without surrounding inflammatory changes to suggest an acute diverticulitis at this time. Normal appendix. Vascular/Lymphatic: Aortic  atherosclerosis, without evidence of aneurysm in the abdominal or pelvic vasculature. No lymphadenopathy noted in the abdomen or pelvis. Reproductive: Prostate gland seminal vesicles are unremarkable in appearance. Other: No significant volume of ascites.  No pneumoperitoneum. Musculoskeletal: Chronic appearing compression fracture of superior endplate of L2 with approximately 30% loss of central vertebral body height. There are no aggressive appearing lytic or blastic lesions noted in the visualized portions of the skeleton. IMPRESSION: 1. No acute findings in the abdomen or pelvis. Specifically, no urinary tract calculi no findings of urinary tract obstruction are noted at this time. 2. Aortic atherosclerosis, in addition to left main and 3 vessel coronary artery disease. 3. Colonic diverticulosis without evidence of acute diverticulitis at this time. 4. Additional incidental findings, as above. Electronically Signed   By: Vinnie Langton M.D.   On: 12/17/2016 14:59   Dg Chest Port 1 View  Result Date: 12/18/2016 CLINICAL DATA:  Acute onset of sepsis.  Initial encounter. EXAM: PORTABLE CHEST 1 VIEW COMPARISON:  Chest radiograph performed 07/22/2016, and CTA of the chest performed 07/20/2016 FINDINGS: The lungs are hypoexpanded. Vascular congestion is noted. Bibasilar airspace opacities may reflect atelectasis or possibly mild pneumonia. There is no evidence of pleural effusion or pneumothorax. The cardiomediastinal silhouette is enlarged. No acute osseous abnormalities are seen. IMPRESSION: Lungs hypoexpanded. Vascular congestion and cardiomegaly. Bibasilar airspace opacities may reflect atelectasis or possibly mild pneumonia. Electronically Signed   By: Garald Balding M.D.   On: 12/18/2016 01:09       Subjective: Patient reports no dysuria, nausea, vomiting, abdominal pain.  Discharge Exam: Vitals:   12/20/16 0536 12/20/16 1442  BP: 127/69 (!) 146/78  Pulse: 70 81  Resp: 20 20  Temp: 98.4 F  (36.9 C) 97.5 F (36.4 C)   Vitals:   12/19/16 1339 12/19/16 2025 12/20/16 0536 12/20/16 1442  BP: (!) 116/59 114/63 127/69 (!) 146/78  Pulse: 87 88 70 81  Resp: (!) 22 20 20 20   Temp: 99.6 F (37.6 C) 98.4 F (36.9 C) 98.4 F (36.9 C) 97.5 F (36.4 C)  TempSrc: Oral Oral Oral Oral  SpO2: 94% 94% 94% 94%  Weight:      Height:        General exam: Appears calm and comfortable HEENT: oropharynx is tacky Respiratory system: Clear to auscultation. Respiratory effort normal. Cardiovascular system: S1 & S2 heard, RRR. No murmurs. 2+ radial pulses Gastrointestinal system: Abdomen is large/distended, soft and nontender. Normal bowel sounds heard. Central nervous system: Alert and oriented. No focal neurological deficits. Extremities: No edema. No calf tenderness Skin: No cyanosis. No rashes Psychiatry: Judgement and insight appear normal. Mood & affect appropriate.    The results of significant diagnostics from this hospitalization (including imaging, microbiology, ancillary and laboratory) are listed below for reference.     Microbiology: Recent Results (from the past 240 hour(s))  Urine culture  Status: Abnormal   Collection Time: 12/17/16  1:25 PM  Result Value Ref Range Status   Specimen Description URINE, RANDOM  Final   Special Requests NONE  Final   Culture >=100,000 COLONIES/mL ESCHERICHIA COLI (A)  Final   Report Status 12/20/2016 FINAL  Final   Organism ID, Bacteria ESCHERICHIA COLI (A)  Final      Susceptibility   Escherichia coli - MIC*    AMPICILLIN <=2 SENSITIVE Sensitive     CEFAZOLIN <=4 SENSITIVE Sensitive     CEFTRIAXONE <=1 SENSITIVE Sensitive     CIPROFLOXACIN <=0.25 SENSITIVE Sensitive     GENTAMICIN <=1 SENSITIVE Sensitive     IMIPENEM <=0.25 SENSITIVE Sensitive     NITROFURANTOIN <=16 SENSITIVE Sensitive     TRIMETH/SULFA <=20 SENSITIVE Sensitive     AMPICILLIN/SULBACTAM <=2 SENSITIVE Sensitive     PIP/TAZO <=4 SENSITIVE Sensitive     Extended  ESBL NEGATIVE Sensitive     * >=100,000 COLONIES/mL ESCHERICHIA COLI  Culture, blood (routine x 2)     Status: None (Preliminary result)   Collection Time: 12/17/16  3:39 PM  Result Value Ref Range Status   Specimen Description BLOOD LEFT ANTECUBITAL  Final   Special Requests   Final    BOTTLES DRAWN AEROBIC AND ANAEROBIC Blood Culture adequate volume   Culture   Final    NO GROWTH 3 DAYS Performed at [facility], [address] [place], [place] [address]    Report Status PENDING  Incomplete  Culture, blood (routine x 2)     Status: None (Preliminary result)   Collection Time: 12/17/16  3:40 PM  Result Value Ref Range Status   Specimen Description BLOOD RIGHT HAND  Final   Special Requests IN PEDIATRIC BOTTLE Blood Culture adequate volume  Final   Culture   Final    NO GROWTH 3 DAYS Performed at [facility], [place] [address] [place], [place] [address]    Report Status PENDING  Incomplete  MRSA PCR Screening     Status: None   Collection Time: 12/18/16  2:11 AM  Result Value Ref Range Status   MRSA by PCR NEGATIVE NEGATIVE Final    Comment:        The GeneXpert MRSA Assay (FDA approved for NASAL specimens only), is one component of a comprehensive MRSA colonization surveillance program. It is not intended to diagnose MRSA infection nor to guide or monitor treatment for MRSA infections.      Labs: BNP (last 3 results)  Recent Labs  02/11/16 2033 07/20/16 1126  BNP 131.7* 408.1*   Basic Metabolic Panel:  Recent Labs Lab 12/17/16 1141 12/18/16 0002 12/19/16 0341 12/20/16 0711  NA 136 135 136 138  K 4.7 4.5 4.4 4.3  CL 101 102 105 106  CO2 28 23 25 24   GLUCOSE 165* 176* 106* 116*  BUN 24* 27* 26* 21*  CREATININE 1.52* 1.85* 1.24 0.98  CALCIUM 9.1 8.7* 8.2* 8.6*   Liver Function Tests:  Recent Labs Lab 12/18/16 0002  AST 25  ALT 14*  ALKPHOS 73  BILITOT 1.0  PROT 6.4*  ALBUMIN 3.1*   No results for input(s): LIPASE, AMYLASE in the  last 168 hours. No results for input(s): AMMONIA in the last 168 hours. CBC:  Recent Labs Lab 12/17/16 1141 12/18/16 0002 12/19/16 0341 12/20/16 0711  WBC 28.3* 31.9* 18.7* 8.2  NEUTROABS 22.9* 26.2*  --   --   HGB 13.2 13.1 11.3* 12.2*  HCT 40.0  38.7* 35.5* 37.1*  MCV 85.5 86.0 84.9 86.1  PLT 232 208 205 201   Cardiac Enzymes: No results for input(s): CKTOTAL, CKMB, CKMBINDEX, TROPONINI in the last 168 hours. BNP: Invalid input(s): POCBNP CBG:  Recent Labs Lab 12/19/16 1150 12/19/16 1732 12/19/16 2029 12/20/16 0729 12/20/16 1145  GLUCAP 131* 134* 185* 108* 127*   D-Dimer No results for input(s): DDIMER in the last 72 hours. Hgb A1c No results for input(s): HGBA1C in the last 72 hours. Lipid Profile No results for input(s): CHOL, HDL, LDLCALC, TRIG, CHOLHDL, LDLDIRECT in the last 72 hours. Thyroid function studies No results for input(s): TSH, T4TOTAL, T3FREE, THYROIDAB in the last 72 hours.  Invalid input(s): FREET3 Anemia work up No results for input(s): VITAMINB12, FOLATE, FERRITIN, TIBC, IRON, RETICCTPCT in the last 72 hours. Urinalysis    Component Value Date/Time   COLORURINE YELLOW 12/17/2016 1325   APPEARANCEUR CLOUDY (A) 12/17/2016 1325   LABSPEC 1.015 12/17/2016 1325   PHURINE 6.0 12/17/2016 1325   GLUCOSEU NEGATIVE 12/17/2016 1325   HGBUR LARGE (A) 12/17/2016 1325   BILIRUBINUR NEGATIVE 12/17/2016 1325   KETONESUR 5 (A) 12/17/2016 1325   PROTEINUR 100 (A) 12/17/2016 1325   UROBILINOGEN 1.0 11/22/2012 1633   NITRITE NEGATIVE 12/17/2016 1325   LEUKOCYTESUR LARGE (A) 12/17/2016 1325   Sepsis Labs Invalid input(s): PROCALCITONIN,  WBC,  LACTICIDVEN Microbiology Recent Results (from the past 240 hour(s))  Urine culture     Status: Abnormal   Collection Time: 12/17/16  1:25 PM  Result Value Ref Range Status   Specimen Description URINE, RANDOM  Final   Special Requests NONE  Final   Culture >=100,000 COLONIES/mL ESCHERICHIA COLI (A)  Final    Report Status 12/20/2016 FINAL  Final   Organism ID, Bacteria ESCHERICHIA COLI (A)  Final      Susceptibility   Escherichia coli - MIC*    AMPICILLIN <=2 SENSITIVE Sensitive     CEFAZOLIN <=4 SENSITIVE Sensitive     CEFTRIAXONE <=1 SENSITIVE Sensitive     CIPROFLOXACIN <=0.25 SENSITIVE Sensitive     GENTAMICIN <=1 SENSITIVE Sensitive     IMIPENEM <=0.25 SENSITIVE Sensitive     NITROFURANTOIN <=16 SENSITIVE Sensitive     TRIMETH/SULFA <=20 SENSITIVE Sensitive     AMPICILLIN/SULBACTAM <=2 SENSITIVE Sensitive     PIP/TAZO <=4 SENSITIVE Sensitive     Extended ESBL NEGATIVE Sensitive     * >=100,000 COLONIES/mL ESCHERICHIA COLI  Culture, blood (routine x 2)     Status: None (Preliminary result)   Collection Time: 12/17/16  3:39 PM  Result Value Ref Range Status   Specimen Description BLOOD LEFT ANTECUBITAL  Final   Special Requests   Final    BOTTLES DRAWN AEROBIC AND ANAEROBIC Blood Culture adequate volume   Culture   Final    NO GROWTH 3 DAYS Performed at [facility], [address] [place], [place] [address]    Report Status PENDING  Incomplete  Culture, blood (routine x 2)     Status: None (Preliminary result)   Collection Time: 12/17/16  3:40 PM  Result Value Ref Range Status   Specimen Description BLOOD RIGHT HAND  Final   Special Requests IN PEDIATRIC BOTTLE Blood Culture adequate volume  Final   Culture   Final    NO GROWTH 3 DAYS Performed at [facility], [place] [address], [place], [place] [address]    Report Status PENDING  Incomplete  MRSA PCR Screening     Status: None  Collection Time: 12/18/16  2:11 AM  Result Value Ref Range Status   MRSA by PCR NEGATIVE NEGATIVE Final    Comment:        The GeneXpert MRSA Assay (FDA approved for NASAL specimens only), is one component of a comprehensive MRSA colonization surveillance program. It is not intended to diagnose MRSA infection nor to guide or monitor treatment for MRSA infections.      Time  coordinating discharge: Over 30 minutes  SIGNED:   Cordelia Poche, MD [facility] 12/20/2016, 2:54 PM Pager [phone]  If 7PM-7AM, please contact night-coverage [email or web address] Password [email or web address]

## 2016-12-21 ENCOUNTER — Telehealth: Payer: Self-pay

## 2016-12-21 NOTE — Telephone Encounter (Signed)
Transition Care Management Follow-up Telephone Call   Date discharged?  12/20/2016   How have you been since you were released from the hospital? "I feel good"   Do you understand why you were in the hospital? Yes, "bladder infection"   Do you understand the discharge instructions? yes   Where were you discharged to? Home.   Items Reviewed:  Medications reviewed: yes, holding antihypertensive meds until f/u appt  Allergies reviewed: yes, no changes.   Dietary changes reviewed: yes, no questions  Referrals reviewed: no   Functional Questionnaire:   Activities of Daily Living (ADLs):   He states they are independent in the following: bathing and hygiene, feeding, continence, grooming, toileting and dressing States they require assistance with the following: Pt is wheelchair bound. [facility]    Any transportation issues/concerns?: no   Any patient concerns? no   Confirmed importance and date/time of follow-up visits scheduled yes  Provider Appointment booked with PCP on Wednesday, 12/28/2016 @ 11am.   Confirmed with patient if condition begins to worsen call PCP or go to the ER.  Patient was given the office number and encouraged to call back with question or concerns.  : yes

## 2016-12-22 LAB — CULTURE, BLOOD (ROUTINE X 2)
CULTURE: NO GROWTH
Culture: NO GROWTH
SPECIAL REQUESTS: ADEQUATE
Special Requests: ADEQUATE

## 2016-12-28 ENCOUNTER — Ambulatory Visit (INDEPENDENT_AMBULATORY_CARE_PROVIDER_SITE_OTHER): Payer: Medicare Other | Admitting: Family Medicine

## 2016-12-28 ENCOUNTER — Encounter: Payer: Self-pay | Admitting: Family Medicine

## 2016-12-28 VITALS — BP 119/69 | HR 71 | Temp 98.4°F | Resp 16 | Ht 71.0 in | Wt 269.2 lb

## 2016-12-28 DIAGNOSIS — Z86711 Personal history of pulmonary embolism: Secondary | ICD-10-CM

## 2016-12-28 DIAGNOSIS — N390 Urinary tract infection, site not specified: Secondary | ICD-10-CM

## 2016-12-28 DIAGNOSIS — N3000 Acute cystitis without hematuria: Secondary | ICD-10-CM

## 2016-12-28 DIAGNOSIS — A419 Sepsis, unspecified organism: Secondary | ICD-10-CM

## 2016-12-28 DIAGNOSIS — E1121 Type 2 diabetes mellitus with diabetic nephropathy: Secondary | ICD-10-CM | POA: Diagnosis not present

## 2016-12-28 LAB — CBC WITH DIFFERENTIAL/PLATELET
Basophils Absolute: 0.1 10*3/uL (ref 0.0–0.1)
Basophils Relative: 0.6 % (ref 0.0–3.0)
EOS ABS: 0.7 10*3/uL (ref 0.0–0.7)
Eosinophils Relative: 4.7 % (ref 0.0–5.0)
HEMATOCRIT: 40.9 % (ref 39.0–52.0)
HEMOGLOBIN: 13.5 g/dL (ref 13.0–17.0)
LYMPHS PCT: 23.6 % (ref 12.0–46.0)
Lymphs Abs: 3.4 10*3/uL (ref 0.7–4.0)
MCHC: 32.9 g/dL (ref 30.0–36.0)
MCV: 84.7 fl (ref 78.0–100.0)
Monocytes Absolute: 1.1 10*3/uL — ABNORMAL HIGH (ref 0.1–1.0)
Monocytes Relative: 7.2 % (ref 3.0–12.0)
Neutro Abs: 9.3 10*3/uL — ABNORMAL HIGH (ref 1.4–7.7)
Neutrophils Relative %: 63.9 % (ref 43.0–77.0)
PLATELETS: 477 10*3/uL — AB (ref 150.0–400.0)
RBC: 4.83 Mil/uL (ref 4.22–5.81)
RDW: 15.7 % — ABNORMAL HIGH (ref 11.5–15.5)
WBC: 14.5 10*3/uL — AB (ref 4.0–10.5)

## 2016-12-28 LAB — BASIC METABOLIC PANEL
BUN: 22 mg/dL (ref 6–23)
CALCIUM: 9.3 mg/dL (ref 8.4–10.5)
CO2: 31 mEq/L (ref 19–32)
CREATININE: 1.01 mg/dL (ref 0.40–1.50)
Chloride: 101 mEq/L (ref 96–112)
GFR: 75.05 mL/min (ref >60.00)
Glucose, Bld: 187 mg/dL — ABNORMAL HIGH (ref 70–99)
Potassium: 5.4 mEq/L — ABNORMAL HIGH (ref 3.5–5.1)
Sodium: 138 mEq/L (ref 135–145)

## 2016-12-28 LAB — HEMOGLOBIN A1C: HEMOGLOBIN A1C: 7.8 % — AB (ref 4.6–6.5)

## 2016-12-28 MED ORDER — GLUCOSE BLOOD VI STRP: ORAL_STRIP | 3 refills | Status: DC

## 2016-12-28 MED ORDER — ACCU-CHEK FASTCLIX LANCET KIT: PACK | 3 refills | Status: DC

## 2016-12-28 MED ORDER — ACCU-CHEK AVIVA PLUS W/DEVICE KIT: PACK | 0 refills | Status: DC

## 2016-12-28 MED ORDER — ACCU-CHEK FASTCLIX LANCETS MISC: 3 refills | Status: DC

## 2016-12-28 NOTE — Progress Notes (Signed)
   Subjective:    Patient ID: [name], male    DOB: [birth year], 83 y.o.   MRN: [id number]  [facility] f/u- pt was admitted 4/21-4/24 w/ urinary sepsis.  At time of admission, pt's WBC was 28.3.  This peaked at 31.9 but had returned to normal at time of D/C and was 8.2  He was d/c'd on Vantin BID.  Pt got over 5 L of fluids on admission- he is up 30 lbs since admission.  Pt reports he is able to urinate w/o difficulty- no pain, no hesitancy.    DM- chronic problem.  [facility]  He continues to struggle w/ understanding his home insulin regimen.  Novolog pen is nearly a year out of date.  Taking 20 units of Lantus nightly.  Home CBGs are running 95-125.    Hx of PE- pt has completed 7 months of Xarelto.  Pt needs to have gallbladder surgery upcoming.  Has been cleared by Cards.   Review of Systems For ROS see HPI     Objective:   Physical Exam  Constitutional: He is oriented to person, place, and time. He appears well-developed and well-nourished. No distress.  obese  HENT:  Head: Normocephalic and atraumatic.  Neck: Normal range of motion. Neck supple. No thyromegaly present.  Cardiovascular: Normal rate, regular rhythm, normal heart sounds and intact distal pulses.   Pulmonary/Chest: Effort normal and breath sounds normal. No respiratory distress. He has no wheezes. He has no rales.  Abdominal: Soft. Bowel sounds are normal. He exhibits no distension. There is no tenderness. There is no rebound and no guarding.  Musculoskeletal: He exhibits edema (trace edema bilaterally).  Lymphadenopathy:    He has no cervical adenopathy.  Neurological: He is alert and oriented to person, place, and time.  Skin: Skin is warm and dry.  Psychiatric: He has a normal mood and affect. His behavior is normal. Thought content normal.  Vitals reviewed.         Assessment & Plan:

## 2016-12-28 NOTE — Patient Instructions (Signed)
You can cancel your appt on 5/15 We'll notify you of your lab results and make any changes if needed STOP the Xarelto- you are done! Once I have your labs back, I will send a note to Dr Redmond Pulling Call with any questions or concerns [name] in there!  You're doing great!!!

## 2016-12-28 NOTE — Progress Notes (Signed)
Pre visit review using our clinic review tool, if applicable. No additional management support is needed unless otherwise documented below in the visit note. 

## 2016-12-30 ENCOUNTER — Other Ambulatory Visit (INDEPENDENT_AMBULATORY_CARE_PROVIDER_SITE_OTHER): Payer: Medicare Other

## 2016-12-30 ENCOUNTER — Other Ambulatory Visit: Payer: Self-pay | Admitting: Family Medicine

## 2016-12-30 DIAGNOSIS — N390 Urinary tract infection, site not specified: Secondary | ICD-10-CM

## 2016-12-30 DIAGNOSIS — D72829 Elevated white blood cell count, unspecified: Secondary | ICD-10-CM | POA: Diagnosis not present

## 2016-12-30 DIAGNOSIS — E875 Hyperkalemia: Secondary | ICD-10-CM

## 2016-12-30 LAB — CBC WITH DIFFERENTIAL/PLATELET
BASOS ABS: 0.2 10*3/uL — AB (ref 0.0–0.1)
Basophils Relative: 1.2 % (ref 0.0–3.0)
Eosinophils Absolute: 0.6 10*3/uL (ref 0.0–0.7)
Eosinophils Relative: 4 % (ref 0.0–5.0)
HCT: 40.6 % (ref 39.0–52.0)
Hemoglobin: 13 g/dL (ref 13.0–17.0)
Lymphocytes Relative: 21.6 % (ref 12.0–46.0)
Lymphs Abs: 3.2 10*3/uL (ref 0.7–4.0)
MCHC: 32.1 g/dL (ref 30.0–36.0)
MCV: 86.3 fl (ref 78.0–100.0)
Monocytes Absolute: 1.3 10*3/uL — ABNORMAL HIGH (ref 0.1–1.0)
Monocytes Relative: 8.8 % (ref 3.0–12.0)
NEUTROS ABS: 9.4 10*3/uL — AB (ref 1.4–7.7)
NEUTROS PCT: 64.4 % (ref 43.0–77.0)
PLATELETS: 447 10*3/uL — AB (ref 150.0–400.0)
RBC: 4.7 Mil/uL (ref 4.22–5.81)
RDW: 15.4 % (ref 11.5–15.5)
WBC: 14.6 10*3/uL — ABNORMAL HIGH (ref 4.0–10.5)

## 2016-12-30 LAB — POCT URINALYSIS DIPSTICK
BILIRUBIN UA: NEGATIVE
Blood, UA: NEGATIVE
Glucose, UA: NEGATIVE
Ketones, UA: NEGATIVE
Leukocytes, UA: NEGATIVE
NITRITE UA: NEGATIVE
PH UA: 6 (ref 5.0–8.0)
Spec Grav, UA: 1.025 (ref 1.010–1.025)
Urobilinogen, UA: 0.2 E.U./dL

## 2016-12-30 LAB — BASIC METABOLIC PANEL
BUN: 20 mg/dL (ref 6–23)
CALCIUM: 9.1 mg/dL (ref 8.4–10.5)
CO2: 31 meq/L (ref 19–32)
CREATININE: 1.01 mg/dL (ref 0.40–1.50)
Chloride: 102 mEq/L (ref 96–112)
GFR: 75.05 mL/min (ref >60.00)
GLUCOSE: 178 mg/dL — AB (ref 70–99)
Potassium: 4.9 mEq/L (ref 3.5–5.1)
Sodium: 139 mEq/L (ref 135–145)

## 2017-01-02 ENCOUNTER — Other Ambulatory Visit: Payer: Self-pay | Admitting: Family Medicine

## 2017-01-02 DIAGNOSIS — D72829 Elevated white blood cell count, unspecified: Secondary | ICD-10-CM

## 2017-01-02 NOTE — Assessment & Plan Note (Signed)
Resolving.  Pt completed outpt course of Vantin.  He states he is currently asymptomatic.  Pt reports he is unable to give a urine sample today to assess for resolution of infxn.  Pt aware that if WBC elevated he will need to complete this.

## 2017-01-02 NOTE — Assessment & Plan Note (Signed)
Chronic problem.  [facility] but continues to struggle with understanding his insulin regimen.  He has a novolog pen in office that is nearly a year out of date.  He is taking 20 units of Lantus nightly.  Both of these are different than the Levemir and Humalog that he is currently listed as taking.  Again stressed the need for regular f[facility] w/ [facility] to ensure his A1C and medications are appropriate.  Pt asking for new meter and strips today- scripts provided.  Will continue to follow along and assist as able.

## 2017-01-02 NOTE — Assessment & Plan Note (Signed)
Pt has completed 7 months of Xarelto.  He is having gallbladder surgery upcoming and would like to stop anticoagulation ASAP.  Will stop today and provide note to surgery that pt is able to proceed w/ surgery.

## 2017-01-09 ENCOUNTER — Other Ambulatory Visit (HOSPITAL_COMMUNITY): Payer: Self-pay | Admitting: Surgery

## 2017-01-09 ENCOUNTER — Ambulatory Visit (HOSPITAL_COMMUNITY)
Admission: RE | Admit: 2017-01-09 | Discharge: 2017-01-09 | Disposition: A | Discharge status: Home / Self Care | Payer: Medicare Other | Source: Ambulatory Visit | Attending: Surgery | Admitting: Surgery

## 2017-01-09 DIAGNOSIS — R932 Abnormal findings on diagnostic imaging of liver and biliary tract: Secondary | ICD-10-CM | POA: Diagnosis not present

## 2017-01-09 DIAGNOSIS — K819 Cholecystitis, unspecified: Secondary | ICD-10-CM | POA: Insufficient documentation

## 2017-01-09 DIAGNOSIS — N200 Calculus of kidney: Secondary | ICD-10-CM | POA: Diagnosis not present

## 2017-01-10 ENCOUNTER — Telehealth: Payer: Self-pay | Admitting: Family Medicine

## 2017-01-10 ENCOUNTER — Ambulatory Visit: Payer: Self-pay | Admitting: General Surgery

## 2017-01-10 ENCOUNTER — Ambulatory Visit: Payer: Medicare Other | Admitting: Family Medicine

## 2017-01-10 NOTE — Patient Instructions (Addendum)
[name]  01/10/2017   Your procedure is scheduled on: 01-13-17  Report to [facility] Main  Entrance Take [facility]  elevators to 3rd floor to  [place] at 1:00PM.   Call this number if you have problems the morning of surgery [phone]    Remember: ONLY 1 PERSON MAY GO WITH YOU TO SHORT STAY TO GET  READY MORNING OF YOUR SURGERY.  Do not eat food After Midnight. You may have clear liquids from midnight until 9am day of surgery. Nothing by mouth after 9am!!     Take these medicines the morning of surgery with A SIP OF WATER: galantamine (razadyne), pantoprazole(Protonix), cyanobalamin , eye drops as needed, dilaudid as needed                               You may not have any metal on your body including hair pins and              piercings  Do not wear jewelry, make-up, lotions, powders or perfumes, deodorant                      Men may shave face and neck.   Do not bring valuables to the hospital. [place].  Contacts, dentures or bridgework may not be worn into surgery.  Leave suitcase in the car. After surgery it may be brought to your room.                Please read over the following fact sheets you were given: _____________________________________________________________________   CLEAR LIQUID DIET   Foods Allowed                                                                     Foods Excluded  Coffee and tea, regular and decaf                             liquids that you cannot  Plain Jell-O in any flavor                                             see through such as: Fruit ices (not with fruit pulp)                                     milk, soups, orange juice  Iced Popsicles                                    All solid food Carbonated beverages, regular and diet  Cranberry, grape and apple juices Sports drinks like Gatorade Lightly seasoned  clear broth or consume(fat free) Sugar, honey syrup  Sample Menu Breakfast                                Lunch                                     Supper Cranberry juice                    Beef broth                            Chicken broth Jell-O                                     Grape juice                           Apple juice Coffee or tea                        Jell-O                                      Popsicle                                                Coffee or tea                        Coffee or tea  _____________________________________________________________________  How to Manage Your Diabetes Before and After Surgery  Why is it important to control my blood sugar before and after surgery? . Improving blood sugar levels before and after surgery helps healing and can limit problems. . A way of improving blood sugar control is eating a healthy diet by: o  Eating less sugar and carbohydrates o  Increasing activity/exercise o  Talking with your doctor about reaching your blood sugar goals . High blood sugars (greater than 180 mg/dL) can raise your risk of infections and slow your recovery, so you will need to focus on controlling your diabetes during the weeks before surgery. . Make sure that the doctor who takes care of your diabetes knows about your planned surgery including the date and location.  How do I manage my blood sugar before surgery? . Check your blood sugar at least 4 times a day, starting 2 days before surgery, to make sure that the level is not too high or low. o Check your blood sugar the morning of your surgery when you wake up and every 2 hours until you get to the Short Stay unit. . If your blood sugar is less than 70 mg/dL, you will need to treat for low blood sugar: o Do not take insulin. o Treat a low blood sugar (less than 70 mg/dL) with  cup of clear juice (cranberry or apple), 4 glucose tablets, OR glucose gel. o  Recheck blood sugar in 15  minutes after treatment (to make sure it is greater than 70 mg/dL). If your blood sugar is not greater than 70 mg/dL on recheck, call [phone] for further instructions. . Report your blood sugar to the short stay nurse when you get to Short Stay.  . If you are admitted to the hospital after surgery: o Your blood sugar will be checked by the staff and you will probably be given insulin after surgery (instead of oral diabetes medicines) to make sure you have good blood sugar levels. o The goal for blood sugar control after surgery is 80-180 mg/dL.   WHAT DO I DO ABOUT MY DIABETES MEDICATION?  Marland Kitchen Do not take oral diabetes medicines (pills) the morning of surgery.  . THE DAY BEFORE SURGERY 01-12-17,    ONLY take breakfast and lunch doses of HUMALOG insulin. DO NOT TAKE DINNER/SUPPER dose!!    Take 10 units of LANTUS insulin at bedtime      . THE MORNING OF SURGERY 01-13-17 ,    If your CBG is greater than 220 mg/dL, you may take  of your sliding scale  (HUMALOG) dose of insulin         Patient Signature:  Date:   Nurse Signature:  Date:   Reviewed and Endorsed by [place] Patient Education Committee, August 2015   [facility] - Preparing for Surgery Before surgery, you can play an important role.  Because skin is not sterile, your skin needs to be as free of germs as possible.  You can reduce the number of germs on your skin by washing with CHG (chlorahexidine gluconate) soap before surgery.  CHG is an antiseptic cleaner which kills germs and bonds with the skin to continue killing germs even after washing. Please DO NOT use if you have an allergy to CHG or antibacterial soaps.  If your skin becomes reddened/irritated stop using the CHG and inform your nurse when you arrive at Short Stay. Do not shave (including legs and underarms) for at least 48 hours prior to the first CHG shower.  You may shave your face/neck. Please follow these instructions carefully:  1.  Shower with CHG  Soap the night before surgery and the  morning of Surgery.  2.  If you choose to wash your hair, wash your hair first as usual with your  normal  shampoo.  3.  After you shampoo, rinse your hair and body thoroughly to remove the  shampoo.                           4.  Use CHG as you would any other liquid soap.  You can apply chg directly  to the skin and wash                       Gently with a scrungie or clean washcloth.  5.  Apply the CHG Soap to your body ONLY FROM THE NECK DOWN.   Do not use on face/ open                           Wound or open sores. Avoid contact with eyes, ears mouth and genitals (private parts).                       Wash face,  Genitals (private parts) with your normal soap.  6.  Wash thoroughly, paying special attention to the area where your surgery  will be performed.  7.  Thoroughly rinse your body with warm water from the neck down.  8.  DO NOT shower/wash with your normal soap after using and rinsing off  the CHG Soap.                9.  Pat yourself dry with a clean towel.            10.  Wear clean pajamas.            11.  Place clean sheets on your bed the night of your first shower and do not  sleep with pets. Day of Surgery : Do not apply any lotions/deodorants the morning of surgery.  Please wear clean clothes to the hospital/surgery center.  FAILURE TO FOLLOW THESE INSTRUCTIONS MAY RESULT IN THE CANCELLATION OF YOUR SURGERY PATIENT SIGNATURE_________________________________  NURSE SIGNATURE__________________________________  ________________________________________________________________________

## 2017-01-10 NOTE — Progress Notes (Signed)
Please place orders in EPIC as patient is being scheduled for a pre-op appointment! Thank you! 

## 2017-01-10 NOTE — Telephone Encounter (Signed)
Pt asking if lab appt is still needed for 5/21. Pt states that he is going to have gallbladder surgery later this week and has had labs done as well. Please advise.

## 2017-01-10 NOTE — Progress Notes (Signed)
Cleared by cardio noted on interpreter needs   LOV T[place]i, mentions cardiac clearance recently 12-28-16 epic  EKG 08-15-16 epic  ECHO 12-18-16 epic  CXR 12-18-16 epic  HgA1C 12-28-16 , BMP. CBCdiff  12-30-16 epic

## 2017-01-11 ENCOUNTER — Encounter (HOSPITAL_COMMUNITY)
Admission: RE | Admit: 2017-01-11 | Discharge: 2017-01-11 | Disposition: A | Discharge status: Home / Self Care | Payer: Medicare Other | Source: Ambulatory Visit | Attending: General Surgery | Admitting: General Surgery

## 2017-01-11 ENCOUNTER — Encounter (HOSPITAL_COMMUNITY): Payer: Self-pay

## 2017-01-11 DIAGNOSIS — Z01812 Encounter for preprocedural laboratory examination: Secondary | ICD-10-CM | POA: Diagnosis not present

## 2017-01-11 DIAGNOSIS — K811 Chronic cholecystitis: Secondary | ICD-10-CM | POA: Insufficient documentation

## 2017-01-11 LAB — GLUCOSE, CAPILLARY: Glucose-Capillary: 152 mg/dL — ABNORMAL HIGH (ref 65–99)

## 2017-01-11 NOTE — Progress Notes (Signed)
Patient at pre-op today refused to have blood drawn . Reports that Dr Redmond Pulling had him come in for blood work on 01-09-17 and also started him on Augmentin for tx of cholecystitis . RN called and LVMM with triage line requesting copy of that lab for review by anesthesia. RN will F/U as needed

## 2017-01-11 NOTE — Telephone Encounter (Signed)
Can hold on repeat labs until after surgery

## 2017-01-11 NOTE — Telephone Encounter (Signed)
Patient notified of PCP recommendations and is agreement and expresses an understanding.  appt was cancelled.

## 2017-01-16 ENCOUNTER — Other Ambulatory Visit: Payer: Medicare Other

## 2017-01-16 ENCOUNTER — Observation Stay (HOSPITAL_COMMUNITY)
Admission: RE | Admit: 2017-01-16 | Discharge: 2017-01-17 | Disposition: A | Discharge status: Home / Self Care | Payer: Medicare Other | Source: Ambulatory Visit | Attending: General Surgery | Admitting: General Surgery

## 2017-01-16 ENCOUNTER — Encounter (HOSPITAL_COMMUNITY)
Admission: RE | Disposition: A | Discharge status: Home / Self Care | Payer: Self-pay | Source: Ambulatory Visit | Attending: General Surgery

## 2017-01-16 ENCOUNTER — Ambulatory Visit (HOSPITAL_COMMUNITY): Payer: Medicare Other

## 2017-01-16 ENCOUNTER — Ambulatory Visit (HOSPITAL_COMMUNITY): Payer: Medicare Other | Admitting: Certified Registered Nurse Anesthetist

## 2017-01-16 ENCOUNTER — Encounter (HOSPITAL_COMMUNITY): Payer: Self-pay | Admitting: *Deleted

## 2017-01-16 DIAGNOSIS — K219 Gastro-esophageal reflux disease without esophagitis: Secondary | ICD-10-CM | POA: Diagnosis not present

## 2017-01-16 DIAGNOSIS — Z87440 Personal history of urinary (tract) infections: Secondary | ICD-10-CM | POA: Diagnosis not present

## 2017-01-16 DIAGNOSIS — Z7901 Long term (current) use of anticoagulants: Secondary | ICD-10-CM | POA: Diagnosis not present

## 2017-01-16 DIAGNOSIS — Z794 Long term (current) use of insulin: Secondary | ICD-10-CM | POA: Diagnosis not present

## 2017-01-16 DIAGNOSIS — G4733 Obstructive sleep apnea (adult) (pediatric): Secondary | ICD-10-CM | POA: Insufficient documentation

## 2017-01-16 DIAGNOSIS — E7800 Pure hypercholesterolemia, unspecified: Secondary | ICD-10-CM | POA: Diagnosis not present

## 2017-01-16 DIAGNOSIS — I10 Essential (primary) hypertension: Secondary | ICD-10-CM | POA: Diagnosis not present

## 2017-01-16 DIAGNOSIS — K819 Cholecystitis, unspecified: Secondary | ICD-10-CM | POA: Diagnosis present

## 2017-01-16 DIAGNOSIS — E785 Hyperlipidemia, unspecified: Secondary | ICD-10-CM | POA: Insufficient documentation

## 2017-01-16 DIAGNOSIS — N400 Enlarged prostate without lower urinary tract symptoms: Secondary | ICD-10-CM | POA: Insufficient documentation

## 2017-01-16 DIAGNOSIS — K811 Chronic cholecystitis: Secondary | ICD-10-CM

## 2017-01-16 DIAGNOSIS — K8000 Calculus of gallbladder with acute cholecystitis without obstruction: Principal | ICD-10-CM | POA: Insufficient documentation

## 2017-01-16 DIAGNOSIS — G8929 Other chronic pain: Secondary | ICD-10-CM | POA: Diagnosis not present

## 2017-01-16 DIAGNOSIS — Z79899 Other long term (current) drug therapy: Secondary | ICD-10-CM | POA: Diagnosis not present

## 2017-01-16 DIAGNOSIS — K8010 Calculus of gallbladder with chronic cholecystitis without obstruction: Secondary | ICD-10-CM | POA: Diagnosis not present

## 2017-01-16 DIAGNOSIS — E119 Type 2 diabetes mellitus without complications: Secondary | ICD-10-CM | POA: Insufficient documentation

## 2017-01-16 DIAGNOSIS — F17220 Nicotine dependence, chewing tobacco, uncomplicated: Secondary | ICD-10-CM | POA: Insufficient documentation

## 2017-01-16 DIAGNOSIS — Z86711 Personal history of pulmonary embolism: Secondary | ICD-10-CM | POA: Diagnosis not present

## 2017-01-16 DIAGNOSIS — F329 Major depressive disorder, single episode, unspecified: Secondary | ICD-10-CM | POA: Diagnosis not present

## 2017-01-16 DIAGNOSIS — E1121 Type 2 diabetes mellitus with diabetic nephropathy: Secondary | ICD-10-CM

## 2017-01-16 HISTORY — PX: CHOLECYSTECTOMY: SHX55

## 2017-01-16 LAB — GLUCOSE, CAPILLARY
GLUCOSE-CAPILLARY: 90 mg/dL (ref 65–99)
GLUCOSE-CAPILLARY: 107 mg/dL — AB (ref 65–99)
GLUCOSE-CAPILLARY: 223 mg/dL — AB (ref 65–99)
GLUCOSE-CAPILLARY: 236 mg/dL — AB (ref 65–99)
Glucose-Capillary: 176 mg/dL — ABNORMAL HIGH (ref 65–99)

## 2017-01-16 SURGERY — LAPAROSCOPIC CHOLECYSTECTOMY WITH INTRAOPERATIVE CHOLANGIOGRAM
Anesthesia: General | Site: Abdomen

## 2017-01-16 MED ORDER — METHOCARBAMOL 500 MG PO TABS: 750.0000 mg | ORAL_TABLET | Freq: Four times a day (QID) | ORAL | Status: DC | PRN

## 2017-01-16 MED ORDER — METOCLOPRAMIDE HCL 5 MG/ML IJ SOLN
INTRAMUSCULAR | Status: AC
  Filled 2017-01-16: qty 2

## 2017-01-16 MED ORDER — TAMSULOSIN HCL 0.4 MG PO CAPS
0.4000 mg | ORAL_CAPSULE | Freq: Every day | ORAL | Status: DC
  Administered 2017-01-16: 0.4 mg via ORAL
  Filled 2017-01-16: qty 1

## 2017-01-16 MED ORDER — PROPOFOL 10 MG/ML IV BOLUS
INTRAVENOUS | Status: DC | PRN
  Administered 2017-01-16: 180 mg via INTRAVENOUS

## 2017-01-16 MED ORDER — TRAZODONE HCL 50 MG PO TABS
50.0000 mg | ORAL_TABLET | Freq: Every day | ORAL | Status: DC
  Administered 2017-01-16: 50 mg via ORAL
  Filled 2017-01-16: qty 1

## 2017-01-16 MED ORDER — HEPARIN SODIUM (PORCINE) 5000 UNIT/ML IJ SOLN
5000.0000 [IU] | INTRAMUSCULAR | Status: AC
  Administered 2017-01-16: 5000 [IU] via SUBCUTANEOUS
  Filled 2017-01-16: qty 1

## 2017-01-16 MED ORDER — ROCURONIUM BROMIDE 50 MG/5ML IV SOSY
PREFILLED_SYRINGE | INTRAVENOUS | Status: AC
  Filled 2017-01-16: qty 10

## 2017-01-16 MED ORDER — BUPIVACAINE HCL (PF) 0.25 % IJ SOLN
INTRAMUSCULAR | Status: AC
Start: 2017-01-16 — End: 2017-01-16
  Filled 2017-01-16: qty 30

## 2017-01-16 MED ORDER — LIDOCAINE 2% (20 MG/ML) 5 ML SYRINGE
INTRAMUSCULAR | Status: AC
  Filled 2017-01-16: qty 5

## 2017-01-16 MED ORDER — DEXAMETHASONE SODIUM PHOSPHATE 10 MG/ML IJ SOLN
INTRAMUSCULAR | Status: AC
  Filled 2017-01-16: qty 1

## 2017-01-16 MED ORDER — 0.9 % SODIUM CHLORIDE (POUR BTL) OPTIME
TOPICAL | Status: DC | PRN
  Administered 2017-01-16: 1000 mL

## 2017-01-16 MED ORDER — ACETAMINOPHEN 650 MG RE SUPP: 650.0000 mg | Freq: Four times a day (QID) | RECTAL | Status: DC | PRN

## 2017-01-16 MED ORDER — SUCCINYLCHOLINE CHLORIDE 200 MG/10ML IV SOSY
PREFILLED_SYRINGE | INTRAVENOUS | Status: DC | PRN
  Administered 2017-01-16: 100 mg via INTRAVENOUS

## 2017-01-16 MED ORDER — PHENYLEPHRINE 40 MCG/ML (10ML) SYRINGE FOR IV PUSH (FOR BLOOD PRESSURE SUPPORT)
PREFILLED_SYRINGE | INTRAVENOUS | Status: AC
  Filled 2017-01-16: qty 10

## 2017-01-16 MED ORDER — ONDANSETRON HCL 4 MG/2ML IJ SOLN
INTRAMUSCULAR | Status: AC
  Filled 2017-01-16: qty 2

## 2017-01-16 MED ORDER — SIMETHICONE 80 MG PO CHEW: 40.0000 mg | CHEWABLE_TABLET | Freq: Four times a day (QID) | ORAL | Status: DC | PRN

## 2017-01-16 MED ORDER — SUCCINYLCHOLINE CHLORIDE 200 MG/10ML IV SOSY
PREFILLED_SYRINGE | INTRAVENOUS | Status: AC
  Filled 2017-01-16: qty 10

## 2017-01-16 MED ORDER — HYDROMORPHONE HCL 1 MG/ML IJ SOLN
INTRAMUSCULAR | Status: AC
  Filled 2017-01-16: qty 0.5

## 2017-01-16 MED ORDER — PHENYLEPHRINE 40 MCG/ML (10ML) SYRINGE FOR IV PUSH (FOR BLOOD PRESSURE SUPPORT)
PREFILLED_SYRINGE | INTRAVENOUS | Status: DC | PRN
  Administered 2017-01-16 (×6): 80 ug via INTRAVENOUS

## 2017-01-16 MED ORDER — HYDROMORPHONE HCL 1 MG/ML IJ SOLN
INTRAMUSCULAR | Status: AC
  Filled 2017-01-16: qty 1

## 2017-01-16 MED ORDER — PANTOPRAZOLE SODIUM 40 MG PO TBEC
40.0000 mg | DELAYED_RELEASE_TABLET | Freq: Every day | ORAL | Status: DC
  Administered 2017-01-17: 40 mg via ORAL
  Filled 2017-01-16: qty 1

## 2017-01-16 MED ORDER — EPHEDRINE SULFATE-NACL 50-0.9 MG/10ML-% IV SOSY
PREFILLED_SYRINGE | INTRAVENOUS | Status: DC | PRN
  Administered 2017-01-16 (×2): 5 mg via INTRAVENOUS
  Administered 2017-01-16: 10 mg via INTRAVENOUS

## 2017-01-16 MED ORDER — ESMOLOL HCL 100 MG/10ML IV SOLN
INTRAVENOUS | Status: DC | PRN
  Administered 2017-01-16 (×2): 10 mg via INTRAVENOUS

## 2017-01-16 MED ORDER — SUGAMMADEX SODIUM 500 MG/5ML IV SOLN
INTRAVENOUS | Status: AC
  Filled 2017-01-16: qty 5

## 2017-01-16 MED ORDER — INSULIN ASPART 100 UNIT/ML ~~LOC~~ SOLN
0.0000 [IU] | Freq: Every day | SUBCUTANEOUS | Status: DC
  Administered 2017-01-16: 2 [IU] via SUBCUTANEOUS

## 2017-01-16 MED ORDER — QUETIAPINE FUMARATE 25 MG PO TABS
50.0000 mg | ORAL_TABLET | Freq: Every day | ORAL | Status: DC
  Administered 2017-01-16: 50 mg via ORAL
  Filled 2017-01-16: qty 2

## 2017-01-16 MED ORDER — LACTATED RINGERS IR SOLN
Status: DC | PRN
  Administered 2017-01-16: 1000 mL

## 2017-01-16 MED ORDER — FENTANYL CITRATE (PF) 100 MCG/2ML IJ SOLN
12.5000 ug | INTRAMUSCULAR | Status: DC | PRN
  Administered 2017-01-16: 12.5 ug via INTRAVENOUS
  Filled 2017-01-16: qty 2

## 2017-01-16 MED ORDER — HYDROMORPHONE HCL 1 MG/ML IJ SOLN
0.2500 mg | INTRAMUSCULAR | Status: DC | PRN
  Administered 2017-01-16 (×3): 0.5 mg via INTRAVENOUS

## 2017-01-16 MED ORDER — HYDROMORPHONE HCL 2 MG PO TABS: 1.0000 mg | ORAL_TABLET | ORAL | Status: DC | PRN

## 2017-01-16 MED ORDER — ONDANSETRON 4 MG PO TBDP: 4.0000 mg | ORAL_TABLET | Freq: Four times a day (QID) | ORAL | Status: DC | PRN

## 2017-01-16 MED ORDER — METOCLOPRAMIDE HCL 5 MG/ML IJ SOLN
INTRAMUSCULAR | Status: DC | PRN
  Administered 2017-01-16: 10 mg via INTRAVENOUS

## 2017-01-16 MED ORDER — FENTANYL CITRATE (PF) 250 MCG/5ML IJ SOLN
INTRAMUSCULAR | Status: AC
  Filled 2017-01-16: qty 5

## 2017-01-16 MED ORDER — ACETAMINOPHEN 325 MG PO TABS: 650.0000 mg | ORAL_TABLET | Freq: Four times a day (QID) | ORAL | Status: DC | PRN

## 2017-01-16 MED ORDER — LIDOCAINE 2% (20 MG/ML) 5 ML SYRINGE
INTRAMUSCULAR | Status: DC | PRN
  Administered 2017-01-16: 60 mg via INTRAVENOUS

## 2017-01-16 MED ORDER — ACETAMINOPHEN 500 MG PO TABS
1000.0000 mg | ORAL_TABLET | ORAL | Status: AC
  Administered 2017-01-16: 1000 mg via ORAL
  Filled 2017-01-16: qty 2

## 2017-01-16 MED ORDER — PROPOFOL 10 MG/ML IV BOLUS
INTRAVENOUS | Status: AC
  Filled 2017-01-16: qty 20

## 2017-01-16 MED ORDER — POLYETHYLENE GLYCOL 3350 17 G PO PACK
17.0000 g | PACK | Freq: Every day | ORAL | Status: DC
  Administered 2017-01-16 – 2017-01-17 (×2): 17 g via ORAL
  Filled 2017-01-16 (×2): qty 1

## 2017-01-16 MED ORDER — INSULIN ASPART 100 UNIT/ML ~~LOC~~ SOLN
0.0000 [IU] | Freq: Three times a day (TID) | SUBCUTANEOUS | Status: DC
  Administered 2017-01-16: 7 [IU] via SUBCUTANEOUS
  Administered 2017-01-17: 4 [IU] via SUBCUTANEOUS

## 2017-01-16 MED ORDER — EPHEDRINE 5 MG/ML INJ
INTRAVENOUS | Status: AC
  Filled 2017-01-16: qty 10

## 2017-01-16 MED ORDER — ENOXAPARIN SODIUM 40 MG/0.4ML ~~LOC~~ SOLN
40.0000 mg | SUBCUTANEOUS | Status: DC
  Administered 2017-01-16: 40 mg via SUBCUTANEOUS
  Filled 2017-01-16: qty 0.4

## 2017-01-16 MED ORDER — ONDANSETRON HCL 4 MG/2ML IJ SOLN
INTRAMUSCULAR | Status: DC | PRN
  Administered 2017-01-16: 4 mg via INTRAVENOUS

## 2017-01-16 MED ORDER — SUGAMMADEX SODIUM 200 MG/2ML IV SOLN
INTRAVENOUS | Status: DC | PRN
  Administered 2017-01-16: 500 mg via INTRAVENOUS

## 2017-01-16 MED ORDER — LACTATED RINGERS IV SOLN
INTRAVENOUS | Status: DC
  Administered 2017-01-16 (×2): via INTRAVENOUS

## 2017-01-16 MED ORDER — MEPERIDINE HCL 50 MG/ML IJ SOLN: 6.2500 mg | INTRAMUSCULAR | Status: DC | PRN

## 2017-01-16 MED ORDER — DEXAMETHASONE SODIUM PHOSPHATE 4 MG/ML IJ SOLN
INTRAMUSCULAR | Status: DC | PRN
  Administered 2017-01-16: 10 mg via INTRAVENOUS

## 2017-01-16 MED ORDER — IOPAMIDOL (ISOVUE-300) INJECTION 61%
INTRAVENOUS | Status: AC
  Filled 2017-01-16: qty 50

## 2017-01-16 MED ORDER — ESMOLOL HCL 100 MG/10ML IV SOLN
INTRAVENOUS | Status: AC
  Filled 2017-01-16: qty 10

## 2017-01-16 MED ORDER — BUPIVACAINE HCL (PF) 0.25 % IJ SOLN
INTRAMUSCULAR | Status: DC | PRN
  Administered 2017-01-16: 20 mL

## 2017-01-16 MED ORDER — PHENYLEPHRINE HCL 10 MG/ML IJ SOLN
INTRAMUSCULAR | Status: AC
  Filled 2017-01-16: qty 1

## 2017-01-16 MED ORDER — DIPHENHYDRAMINE HCL 12.5 MG/5ML PO ELIX: 12.5000 mg | ORAL_SOLUTION | Freq: Four times a day (QID) | ORAL | Status: DC | PRN

## 2017-01-16 MED ORDER — CARBOXYMETHYLCELLULOSE SODIUM 0.25 % OP SOLN: 1.0000 [drp] | Freq: Three times a day (TID) | OPHTHALMIC | Status: DC | PRN

## 2017-01-16 MED ORDER — ONDANSETRON HCL 4 MG/2ML IJ SOLN: 4.0000 mg | Freq: Four times a day (QID) | INTRAMUSCULAR | Status: DC | PRN

## 2017-01-16 MED ORDER — DIPHENHYDRAMINE HCL 50 MG/ML IJ SOLN: 12.5000 mg | Freq: Four times a day (QID) | INTRAMUSCULAR | Status: DC | PRN

## 2017-01-16 MED ORDER — PROMETHAZINE HCL 25 MG/ML IJ SOLN: 6.2500 mg | INTRAMUSCULAR | Status: DC | PRN

## 2017-01-16 MED ORDER — CEFOTETAN DISODIUM-DEXTROSE 2-2.08 GM-% IV SOLR
2.0000 g | INTRAVENOUS | Status: AC
  Administered 2017-01-16: 2 g via INTRAVENOUS
  Filled 2017-01-16: qty 50

## 2017-01-16 MED ORDER — ROCURONIUM BROMIDE 50 MG/5ML IV SOSY
PREFILLED_SYRINGE | INTRAVENOUS | Status: DC | PRN
  Administered 2017-01-16: 10 mg via INTRAVENOUS
  Administered 2017-01-16: 50 mg via INTRAVENOUS
  Administered 2017-01-16 (×2): 10 mg via INTRAVENOUS

## 2017-01-16 MED ORDER — POLYVINYL ALCOHOL 1.4 % OP SOLN: 1.0000 [drp] | OPHTHALMIC | Status: DC | PRN

## 2017-01-16 MED ORDER — INSULIN GLARGINE 100 UNIT/ML ~~LOC~~ SOLN
20.0000 [IU] | Freq: Every day | SUBCUTANEOUS | Status: DC
  Administered 2017-01-16: 20 [IU] via SUBCUTANEOUS
  Filled 2017-01-16: qty 0.2

## 2017-01-16 MED ORDER — FENTANYL CITRATE (PF) 100 MCG/2ML IJ SOLN
INTRAMUSCULAR | Status: DC | PRN
  Administered 2017-01-16 (×2): 25 ug via INTRAVENOUS
  Administered 2017-01-16 (×4): 50 ug via INTRAVENOUS

## 2017-01-16 SURGICAL SUPPLY — 56 items
ADH SKN CLS APL DERMABOND .7 (GAUZE/BANDAGES/DRESSINGS)
APL SKNCLS STERI-STRIP NONHPOA (GAUZE/BANDAGES/DRESSINGS) ×1
APL SRG 38 LTWT LNG FL B (MISCELLANEOUS)
APPLICATOR ARISTA FLEXITIP XL (MISCELLANEOUS) IMPLANT
APPLIER CLIP 5 13 M/L LIGAMAX5 (MISCELLANEOUS)
APPLIER CLIP ROT 10 11.4 M/L (STAPLE) ×3
APR CLP MED LRG 11.4X10 (STAPLE) ×1
APR CLP MED LRG 5 ANG JAW (MISCELLANEOUS)
BAG SPEC RTRVL 10 TROC 200 (ENDOMECHANICALS) ×1
BANDAGE ADH SHEER 1  50/CT (GAUZE/BANDAGES/DRESSINGS) ×12 IMPLANT
BENZOIN TINCTURE PRP APPL 2/3 (GAUZE/BANDAGES/DRESSINGS) ×2 IMPLANT
CABLE HIGH FREQUENCY MONO STRZ (ELECTRODE) ×3 IMPLANT
CHLORAPREP W/TINT 26ML (MISCELLANEOUS) ×3 IMPLANT
CLIP APPLIE 5 13 M/L LIGAMAX5 (MISCELLANEOUS) IMPLANT
CLIP APPLIE ROT 10 11.4 M/L (STAPLE) IMPLANT
CLIP LIGATING HEMO O LOK GREEN (MISCELLANEOUS) IMPLANT
CLOSURE WOUND 1/2 X4 (GAUZE/BANDAGES/DRESSINGS) ×1
COVER MAYO STAND STRL (DRAPES) ×2 IMPLANT
COVER SURGICAL LIGHT HANDLE (MISCELLANEOUS) ×3 IMPLANT
DECANTER SPIKE VIAL GLASS SM (MISCELLANEOUS) ×3 IMPLANT
DERMABOND ADVANCED (GAUZE/BANDAGES/DRESSINGS)
DERMABOND ADVANCED .7 DNX12 (GAUZE/BANDAGES/DRESSINGS) IMPLANT
DEVICE PMI PUNCTURE CLOSURE (MISCELLANEOUS) ×2 IMPLANT
DRAPE C-ARM 42X120 X-RAY (DRAPES) ×2 IMPLANT
DRSG TEGADERM 2-3/8X2-3/4 SM (GAUZE/BANDAGES/DRESSINGS) IMPLANT
ELECT PENCIL ROCKER SW 15FT (MISCELLANEOUS) IMPLANT
ENDOLOOP SUT PDS II  0 18 (SUTURE) ×2
ENDOLOOP SUT PDS II 0 18 (SUTURE) IMPLANT
GAUZE SPONGE 2X2 8PLY STRL LF (GAUZE/BANDAGES/DRESSINGS) IMPLANT
GLOVE BIO SURGEON STRL SZ7.5 (GLOVE) ×3 IMPLANT
GLOVE INDICATOR 8.0 STRL GRN (GLOVE) ×3 IMPLANT
GOWN STRL REUS W/TWL XL LVL3 (GOWN DISPOSABLE) ×11 IMPLANT
GRASPER SUT TROCAR 14GX15 (MISCELLANEOUS) IMPLANT
HEMOSTAT ARISTA ABSORB 3G PWDR (MISCELLANEOUS) IMPLANT
HEMOSTAT SNOW SURGICEL 2X4 (HEMOSTASIS) IMPLANT
KIT BASIN OR (CUSTOM PROCEDURE TRAY) ×3 IMPLANT
L-HOOK LAP DISP 36CM (ELECTROSURGICAL)
LHOOK LAP DISP 36CM (ELECTROSURGICAL) IMPLANT
POUCH RETRIEVAL ECOSAC 10 (ENDOMECHANICALS) ×1 IMPLANT
POUCH RETRIEVAL ECOSAC 10MM (ENDOMECHANICALS) ×2
SCISSORS LAP 5X35 DISP (ENDOMECHANICALS) ×3 IMPLANT
SET CHOLANGIOGRAPH MIX (MISCELLANEOUS) IMPLANT
SET IRRIG TUBING LAPAROSCOPIC (IRRIGATION / IRRIGATOR) ×2 IMPLANT
SLEEVE XCEL OPT CAN 5 100 (ENDOMECHANICALS) ×8 IMPLANT
SPONGE GAUZE 2X2 STER 10/PKG (GAUZE/BANDAGES/DRESSINGS)
STRIP CLOSURE SKIN 1/2X4 (GAUZE/BANDAGES/DRESSINGS) ×1 IMPLANT
SUT MNCRL AB 4-0 PS2 18 (SUTURE) ×3 IMPLANT
SUT VICRYL 0 TIES 12 18 (SUTURE) IMPLANT
SUT VICRYL 0 UR6 27IN ABS (SUTURE) IMPLANT
TOWEL OR 17X26 10 PK STRL BLUE (TOWEL DISPOSABLE) ×3 IMPLANT
TOWEL OR NON WOVEN STRL DISP B (DISPOSABLE) ×3 IMPLANT
TRAY LAPAROSCOPIC (CUSTOM PROCEDURE TRAY) ×3 IMPLANT
TROCAR BLADELESS OPT 5 100 (ENDOMECHANICALS) ×3 IMPLANT
TROCAR XCEL BLUNT TIP 100MML (ENDOMECHANICALS) IMPLANT
TROCAR XCEL NON-BLD 11X100MML (ENDOMECHANICALS) ×2 IMPLANT
TUBING INSUF HEATED (TUBING) ×3 IMPLANT

## 2017-01-16 NOTE — Transfer of Care (Signed)
Immediate Anesthesia Transfer of Care Note  Patient: [name]  Procedure(s) Performed: Procedure(s): LAPAROSCOPIC CHOLECYSTECTOMY WITH INTRAOPERATIVE CHOLANGIOGRAM (N/A)  Patient Location: PACU  Anesthesia Type:General  Level of Consciousness: Patient easily awoken, sedated, comfortable, cooperative, following commands, responds to stimulation.   Airway & Oxygen Therapy: Patient spontaneously breathing, ventilating well, oxygen via simple oxygen mask.  Post-op Assessment: Report given to PACU RN, vital signs reviewed and stable, moving all extremities.   Post vital signs: Reviewed and stable.  Complications: No apparent anesthesia complications  Last Vitals:  Vitals:   01/16/17 0643  BP: 115/62  Pulse: 84  Resp: 18  Temp: 36.8 C    Last Pain:  Vitals:   01/16/17 0643  TempSrc: Oral      Patients Stated Pain Goal: 3 (71/27/87 1836)  Complications: No apparent anesthesia complications

## 2017-01-16 NOTE — Interval H&P Note (Signed)
History and Physical Interval Note:  01/16/2017 10:10 AM  [name]  has presented today for surgery, with the diagnosis of Chronic cholecystitis  The various methods of treatment have been discussed with the patient and family. After consideration of risks, benefits and other options for treatment, the patient has consented to  Procedure(s): LAPAROSCOPIC CHOLECYSTECTOMY WITH INTRAOPERATIVE CHOLANGIOGRAM (N/A) as a surgical intervention .  The patient's history has been reviewed, patient examined, no change in status, stable for surgery.  I have reviewed the patient's chart and labs.  Questions were answered to the patient's satisfaction.    Leighton Ruff. Redmond Pulling, MD, [place], Bariatric, & Minimally Invasive Surgery [facility], [place]   [facility]

## 2017-01-16 NOTE — Anesthesia Procedure Notes (Signed)
Procedure Name: Intubation Date/Time: 01/16/2017 10:31 AM Performed by: Deliah Boston Pre-anesthesia Checklist: Patient identified, Emergency Drugs available, Suction available and Patient being monitored Patient Re-evaluated:Patient Re-evaluated prior to inductionOxygen Delivery Method: Circle system utilized Preoxygenation: Pre-oxygenation with 100% oxygen Intubation Type: IV induction, Cricoid Pressure applied and Rapid sequence Laryngoscope Size: Mac and 4 Grade View: Grade I Tube type: Oral Tube size: 7.5 mm Number of attempts: 1 Airway Equipment and Method: Stylet and Oral airway Placement Confirmation: ETT inserted through vocal cords under direct vision,  positive ETCO2 and breath sounds checked- equal and bilateral Secured at: 22 cm Tube secured with: Tape Dental Injury: Teeth and Oropharynx as per pre-operative assessment
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
## 2017-01-16 NOTE — Op Note (Signed)
[name] [id number] [birth year] 01/16/2017  Laparoscopic Cholecystectomy Procedure Note  Indications: This patient presents with symptomatic gallbladder disease and will undergo laparoscopic cholecystectomy. The patient had been admitted in November 2017 with acute severe calculus cholecystitis and pulmonary emboli. He underwent percutaneous drain placement. It has been 6 months since his event and he is no longer on oral anticoagulants. A follow-up cholangiogram demonstrated patency of his cystic duct and common bile duct. His cholecystostomy tube fell out afterwards. We had planned for interval cholecystectomy given that he was felt to be at high risk for recurrent cholecystitis. His surgery was moved up to this week after calling in last week with recurrent right upper quadrant pain. Ultrasound was reperformed which demonstrated wall thickening and some fluid as well as a mild leukocytosis.  Pre-operative Diagnosis: Acute on chronic calculus cholecystitis  Post-operative Diagnosis: Same  Surgeon: Gayland Curry   Assistants: Alphonsa Overall, MD  Anesthesia: General endotracheal anesthesia  Procedure Details  The patient was seen again in the Holding Room. The risks, benefits, complications, treatment options, and expected outcomes were discussed with the patient. The possibilities of reaction to medication, pulmonary aspiration, perforation of viscus, bleeding, recurrent infection, finding a normal gallbladder, the need for additional procedures, failure to diagnose a condition, the possible need to convert to an open procedure, and creating a complication requiring transfusion or operation were discussed with the patient. The likelihood of improving the patient's symptoms with return to their baseline status is good.  The patient and/or family concurred with the proposed plan, giving informed consent. The site of surgery properly noted. The patient was taken to Operating Room, identified as  [name] and the procedure verified as Laparoscopic Cholecystectomy with poosible Intraoperative Cholangiogram. A Time Out was held and the above information confirmed. Antibiotic prophylaxis was administered. Because of his PE history the patient received 5000 units of subcutaneous heparin preoperatively  Prior to the induction of general anesthesia, antibiotic prophylaxis was administered. General endotracheal anesthesia was then administered and tolerated well. After the induction, the abdomen was prepped with Chloraprep and draped in the sterile fashion. The patient was positioned in the supine position.  He had a large protuberant abdomen and I was afraid going in at his umbilicus would be to our way from the gallbladder therefore I went in in the left upper quadrant using the Optiview technique. A small half centimeter incision was made just below the left subcostal margin to the left and midline. Then using a 0 5 mm laparoscope through a 5 mm trocar advanced through all layers of the abdominal wall into the abdominal cavity. Pneumoperitoneum was smoothly established up to a patient pressure 15 mmHg without any changes in the patient's vital signs. The patient was placed in reverse Trendelenburg and rotated to the left.   Two 5-mm ports were placed in the right upper quadrant. A 5 mm trocar was placed in the supraumbilical position for the camera port. All skin incisions were infiltrated with a local anesthetic agent before making the incision and placing the trocars.     The gallbladder was identified. It was contracted and appeared chronically inflamed. There were thin filmy adhesions between the right lobe of the liver and the abdominal wall as well as in all track between the dome of the gallbladder and the abdominal wall. This was all taken down with electrocautery with hook. the fundus grasped and retracted cephalad. Adhesions were lysed bluntly and with the electrocautery where indicated,  taking care not to  injure any adjacent organs or viscus. The infundibulum was grasped and retracted laterally, exposing the peritoneum overlying the triangle of Calot. This was then divided and exposed in a blunt fashion. A critical view of the cystic duct and cystic artery was obtained.  The cystic duct was clearly identified and bluntly dissected circumferentially. The cystic duct was then ligated with clips and divided. The cystic artery which had been identified & dissected free was ligated with clips and divided as well. I did elect to place a PDS Endoloop around the cystic duct stump.  The gallbladder was dissected from the liver bed in retrograde fashion with the electrocautery. The gallbladder was removed and placed in an Ecco sac.  The gallbladder and Ecco sac were then removed through the left upper quadrant port site which had been upsized to an 11 mm trocar. The liver bed was irrigated and inspected. Hemostasis was achieved with the electrocautery. Copious irrigation was utilized and was repeatedly aspirated until clear.  2-0 Vicryl sutures were used to reapproximate the fascia in the left upper quadrant trocar site using the PMI suture passer. We again inspected the right upper quadrant for hemostasis.  The left upper quadrant closure was inspected and there was no air leak and nothing trapped within the closure. Pneumoperitoneum was released as we removed the trocars.  4-0 Monocryl was used to close the skin.   Benzoin, steri-strips, and clean dressings were applied. The patient was then extubated and brought to the recovery room in stable condition. Instrument, sponge, and needle counts were correct at closure and at the conclusion of the case.   Findings: Cholecystitis with Cholelithiasis  Estimated Blood Loss: Minimal         Drains: none         Specimens: Gallbladder           Complications: None; patient tolerated the procedure well.         Disposition: PACU - hemodynamically  stable.         Condition: stable  Leighton Ruff. Redmond Pulling, MD, FACS General, Bariatric, & Minimally Invasive Surgery [facility], [place]

## 2017-01-16 NOTE — Anesthesia Preprocedure Evaluation (Signed)
Anesthesia Evaluation  Patient identified by MRN, date of birth, ID band Patient awake    Reviewed: Allergy & Precautions, NPO status , Patient's Chart, lab work & pertinent test results  Airway Mallampati: I  TM Distance: >3 FB Neck ROM: Full    Dental no notable dental hx. (+) Teeth Intact   Pulmonary sleep apnea and Continuous Positive Airway Pressure Ventilation , pneumonia, resolved,    Pulmonary exam normal breath sounds clear to auscultation       Cardiovascular hypertension, Normal cardiovascular exam Rhythm:Regular Rate:Normal  Hx/o PTE   Neuro/Psych PSYCHIATRIC DISORDERS Depression Hx/o PTSD Neuromuscular disease    GI/Hepatic PUD, GERD  Medicated and Controlled,Chronic cholecystitis S/P cholecystostomy tube drainage   Endo/Other  diabetes, Poorly Controlled, Type 2, Insulin Dependent, Oral Hypoglycemic AgentsObesity Hyperlipidemia  Renal/GU Renal diseaseHx/o AKI   BPH    Musculoskeletal  (+) Arthritis , Osteoarthritis,  Chronic LBP   Abdominal (+) + obese,   Peds  Hematology   Anesthesia Other Findings   Reproductive/Obstetrics                             Anesthesia Physical Anesthesia Plan  ASA: III  Anesthesia Plan: General   Post-op Pain Management:    Induction: Intravenous, Rapid sequence and Cricoid pressure planned  Airway Management Planned: Oral ETT  Additional Equipment:   Intra-op Plan:   Post-operative Plan: Extubation in OR  Informed Consent: I have reviewed the patients History and Physical, chart, labs and discussed the procedure including the risks, benefits and alternatives for the proposed anesthesia with the patient or authorized representative who has indicated his/her understanding and acceptance.     Plan Discussed with: CRNA, Anesthesiologist and Surgeon  Anesthesia Plan Comments:         Anesthesia Quick Evaluation

## 2017-01-16 NOTE — Anesthesia Postprocedure Evaluation (Signed)
Anesthesia Post Note  Patient: DANTAVIOUS SNOWBALL  Procedure(s) Performed: Procedure(s) (LRB): LAPAROSCOPIC CHOLECYSTECTOMY WITH INTRAOPERATIVE CHOLANGIOGRAM (N/A)  Patient location during evaluation: PACU Anesthesia Type: General Level of consciousness: awake and alert and oriented Pain management: pain level controlled Vital Signs Assessment: post-procedure vital signs reviewed and stable Respiratory status: spontaneous breathing, nonlabored ventilation, respiratory function stable and patient connected to nasal cannula oxygen Cardiovascular status: blood pressure returned to baseline and stable Postop Assessment: no signs of nausea or vomiting Anesthetic complications: no       Last Vitals:  Vitals:   01/16/17 1215 01/16/17 1230  BP: (!) 151/87 134/77  Pulse: 81 81  Resp: 15 20  Temp:      Last Pain:  Vitals:   01/16/17 0643  TempSrc: Oral                 Shakyia Bosso A.

## 2017-01-16 NOTE — Progress Notes (Signed)
Spoke with patient about setting up a cpap machine for tonight. He refused.

## 2017-01-17 ENCOUNTER — Encounter (HOSPITAL_COMMUNITY): Payer: Self-pay | Admitting: General Surgery

## 2017-01-17 DIAGNOSIS — G4733 Obstructive sleep apnea (adult) (pediatric): Secondary | ICD-10-CM | POA: Diagnosis not present

## 2017-01-17 DIAGNOSIS — E119 Type 2 diabetes mellitus without complications: Secondary | ICD-10-CM | POA: Diagnosis not present

## 2017-01-17 DIAGNOSIS — G8929 Other chronic pain: Secondary | ICD-10-CM | POA: Diagnosis not present

## 2017-01-17 DIAGNOSIS — I10 Essential (primary) hypertension: Secondary | ICD-10-CM | POA: Diagnosis not present

## 2017-01-17 DIAGNOSIS — K8000 Calculus of gallbladder with acute cholecystitis without obstruction: Secondary | ICD-10-CM | POA: Diagnosis not present

## 2017-01-17 LAB — CBC
HEMATOCRIT: 37 % — AB (ref 39.0–52.0)
Hemoglobin: 11.9 g/dL — ABNORMAL LOW (ref 13.0–17.0)
MCH: 28.1 pg (ref 26.0–34.0)
MCHC: 32.2 g/dL (ref 30.0–36.0)
MCV: 87.5 fL (ref 78.0–100.0)
Platelets: 239 10*3/uL (ref 150–400)
RBC: 4.23 MIL/uL (ref 4.22–5.81)
RDW: 14.4 % (ref 11.5–15.5)
WBC: 12.9 10*3/uL — AB (ref 4.0–10.5)

## 2017-01-17 LAB — GLUCOSE, CAPILLARY: Glucose-Capillary: 159 mg/dL — ABNORMAL HIGH (ref 65–99)

## 2017-01-17 NOTE — Discharge Summary (Signed)
Physician Discharge Summary  [name] [id number] DOB: [birth year] DOA: 01/16/2017  PCP: [name], MD  Admit date: 01/16/2017 Discharge date: 01/17/2017  Recommendations for Outpatient Follow-up:  1.   Follow-up Information    Greer Pickerel, MD. Go on 01/31/2017.   Specialty:  General Surgery Why:  4:45 PM- PLEASE ARRIVE AT 4:30 PM Contact information: [place] [address] [phone]        [name], MD. Schedule an appointment as soon as possible for a visit in 2 week(s).   Specialty:  Family Medicine Why:  for routine followup Contact information: [address] [place] [address] [phone]          Discharge Diagnoses:  1. Chronic calculous cholecystitis 2. Obesity 3. HTN 4. Diabetes mellitus - insulin dependent 5. History of pulmonary emboli   Surgical Procedure: Laparoscopic cholecystectomy  Discharge Condition: good Disposition: home  Diet recommendation: Diabetic  Filed Weights   01/16/17 0900  Weight: 106.4 kg (234 lb 9.6 oz)    Hospital Course:  The patient was brought in for planned interval laparoscopic cholecystectomy. He presented back in the fall of 2017 with severe acute cholecystitis and acute pulmonary emboli. He was medically managed with percutaneous cholecystostomy tube. He recovered from his sepsis and pulmonary emboli. The plan was to wait until he was 6 months from his pulmonary emboli before operating room for his gallbladder disease. A follow-up cholangiogram demonstrated patency of his cystic and common bile duct. His cholecystostomy tube drain fell out about a month and a half ago. He had been asymptomatic until last week when he developed recurrent right upper quadrant pain. He was placed back on oral antibiotics and his surgery date was moved up. He was no longer on oral anticoagulation. He had been cleared by cardiology.  He came in for a planned laparoscopic  cholecystectomy. Please see operative note. Surgery was unremarkable. He was kept overnight because of his age and multiple comorbidities. On postoperative day 1 he was doing well. He had ambulated in the hallway. He was tolerating his diet. His vital signs are stable. He had voided without difficulty. His pain was well controlled. He was deemed safe for discharge. We discussed discharge instructions  BP (!) 105/56 (BP Location: Right Arm)   Pulse 78   Temp 98.2 F (36.8 C) (Oral)   Resp 18   Ht _0  (1.803 m)   Wt 106.4 kg (234 lb 9.6 oz)   SpO2 98%   BMI 32.72 kg/m   Gen: alert, NAD, non-toxic appearing, morbidly obese Pupils: equal, no scleral icterus Pulm: Lungs clear to auscultation, symmetric chest rise CV: regular rate and rhythm Abd: soft, nontender, nondistended. No cellulitis. No incisional hernia Ext: no edema, no calf tenderness Skin: no rash, no jaundice    Discharge Instructions  Discharge Instructions    Call MD for:    Complete by:  As directed    Temperature >101   Call MD for:  hives    Complete by:  As directed    Call MD for:  persistant dizziness or light-headedness    Complete by:  As directed    Call MD for:  persistant nausea and vomiting    Complete by:  As directed    Call MD for:  redness, tenderness, or signs of infection (pain, swelling, redness, odor or green/yellow discharge around incision site)    Complete by:  As directed    Call  MD for:  severe uncontrolled pain    Complete by:  As directed    Diet Carb Modified    Complete by:  As directed    Discharge instructions    Complete by:  As directed    See [facility] discharge instructions   Increase activity slowly    Complete by:  As directed      Allergies as of 01/17/2017      Reactions   Oxycontin [oxycodone Hcl] Other (See Comments)   Pt states he cant sleep w/ this med; also makes him constipated.       Medication List    STOP taking these medications   amoxicillin-clavulanate  875-125 MG tablet Commonly known as:  AUGMENTIN   rivaroxaban 20 MG Tabs tablet Commonly known as:  XARELTO     TAKE these medications   ACCU-CHEK AVIVA PLUS w/Device Kit Use glucometer to test sugars 4 times daily. Dx. E11.9   ACCU-CHEK FASTCLIX LANCET Kit Use one lancet each time sugars are tested. Pt checks sugars 4 times daily.   ACCU-CHEK FASTCLIX LANCETS Misc Use one lancet each time sugars are tested. Pt checks sugars 4 times daily.   cyanocobalamin 500 MCG tablet Take 500 mcg by mouth 2 (two) times daily.   galantamine 16 MG 24 hr capsule Commonly known as:  RAZADYNE ER Take 16 mg by mouth daily with breakfast.   glucose blood test strip Commonly known as:  ACCU-CHEK AVIVA PLUS Use one strip to test sugars. Pt checks sugars 4 times daily. Dx. E11.9   HUMALOG 100 UNIT/ML injection Generic drug:  insulin lispro Inject 0-5 Units into the skin 3 (three) times daily before meals. 0-200 = 0 units, 201-250 = 2 units, 251-300 = 3 units, 301-350 = 4 units, 351-400 = 5 units, >401 call MD/NP   HYDROmorphone 2 MG tablet Commonly known as:  DILAUDID Take 2 mg by mouth 2 (two) times daily as needed for severe pain.   insulin detemir 100 UNIT/ML injection Commonly known as:  LEVEMIR Inject 0.2 mLs (20 Units total) into the skin at bedtime.   insulin glargine 100 UNIT/ML injection Commonly known as:  LANTUS Inject 20 Units into the skin at bedtime.   lactose free nutrition Liqd Take 237 mLs by mouth daily as needed (nutrition).   magnesium oxide 400 MG tablet Commonly known as:  MAG-OX Take 400 mg by mouth 2 (two) times daily.   Melatonin 5 MG Tabs Take 5 mg by mouth at bedtime.   methocarbamol 750 MG tablet Commonly known as:  ROBAXIN Take 1 tablet (750 mg total) by mouth every 6 (six) hours as needed for muscle spasms.   pantoprazole 40 MG tablet Commonly known as:  PROTONIX Take 1 tablet (40 mg total) by mouth daily.   polyethylene glycol packet Commonly  known as:  MIRALAX / GLYCOLAX Take 17-25.5 g by mouth daily.   PRESERVISION AREDS 2 PO Take 1 capsule by mouth 2 (two) times daily.   QUEtiapine 50 MG tablet Commonly known as:  SEROQUEL Take 50 mg by mouth at bedtime.   rosuvastatin 40 MG tablet Commonly known as:  CRESTOR Take 20 mg by mouth every evening.   tamsulosin 0.4 MG Caps capsule Commonly known as:  FLOMAX Take 1 capsule (0.4 mg total) by mouth daily after supper.   THERATEARS OP Apply 1 drop to eye 3 (three) times daily as needed (dry eyes).   traZODone 50 MG tablet Commonly known as:  DESYREL Take 50 mg  by mouth at bedtime.   Vitamin D3 2000 units capsule Take 2,000 Units by mouth 2 (two) times daily.      Follow-up Information    Greer Pickerel, MD. Go on 01/31/2017.   Specialty:  General Surgery Why:  4:45 PM- PLEASE ARRIVE AT 4:30 PM Contact information: [place] [address] [phone]        [name], MD. Schedule an appointment as soon as possible for a visit in 2 week(s).   Specialty:  Family Medicine Why:  for routine followup Contact information: [address] [place] [address] [phone]            The results of significant diagnostics from this hospitalization (including imaging, microbiology, ancillary and laboratory) are listed below for reference.    Significant Diagnostic Studies: US Abdomen Limited Ruq  Result Date: 01/09/2017 CLINICAL DATA:  Tender in the right upper quadrant for 3 days. EXAM: US ABDOMEN LIMITED - RIGHT UPPER QUADRANT COMPARISON:  CT, 12/17/2016 FINDINGS: Gallbladder: The gallbladder not well-defined, and appears contracted as it was on the prior CT scan. Shadowing from the contracted gallbladder is consistent with stones. Wall appears borderline thickened measuring 4 mm. Common bile duct: Diameter: 7.6 mm.  No duct stone visualized. Liver: Increased and heterogeneous parenchymal echogenicity. No mass or focal  lesion. IMPRESSION: 1. Gallbladder is not well evaluated on this exam, with the gallbladder not well-distended. There is shadowing consistent with multiple stones. Wall is borderline thickened measuring 4 mm. No pericholecystic fluid. Findings raise the possibility of acute cholecystitis, but not definitive. Consider followup HIDA scan. 2. Increased echogenicity of the liver is consistent with hepatic steatosis. Electronically Signed   By: Lajean Manes M.D.   On: 01/09/2017 13:54    Microbiology: No results found for this or any previous visit (from the past 240 hour(s)).   Labs: Basic Metabolic Panel: No results for input(s): NA, K, CL, CO2, GLUCOSE, BUN, CREATININE, CALCIUM, MG, PHOS in the last 168 hours. Liver Function Tests: No results for input(s): AST, ALT, ALKPHOS, BILITOT, PROT, ALBUMIN in the last 168 hours. No results for input(s): LIPASE, AMYLASE in the last 168 hours. No results for input(s): AMMONIA in the last 168 hours. CBC:  Recent Labs Lab 01/17/17 0412  WBC 12.9*  HGB 11.9*  HCT 37.0*  MCV 87.5  PLT 239   Cardiac Enzymes: No results for input(s): CKTOTAL, CKMB, CKMBINDEX, TROPONINI in the last 168 hours. BNP: BNP (last 3 results)  Recent Labs  02/11/16 2033 07/20/16 1126  BNP 131.7* 137.6*    ProBNP (last 3 results) No results for input(s): PROBNP in the last 8760 hours.  CBG:  Recent Labs Lab 01/16/17 0640 01/16/17 1016 01/16/17 1228 01/16/17 1652 01/16/17 2117  GLUCAP 90 107* 176* 223* 236*    Active Problems:   Cholecystitis   Time coordinating discharge: 15 min  Signed:  Gayland Curry, MD [facility], [place] [phone] 01/17/2017, 7:42 AM

## 2017-01-17 NOTE — Progress Notes (Signed)
Vital signs stable, no complaints of pain.  D/C instructions were given and understanding was verbalized.

## 2017-01-17 NOTE — Discharge Instructions (Signed)
[facility] LAPAROSCOPIC SURGERY: POST OP INSTRUCTIONS Always review your discharge instruction sheet given to you by the facility where your surgery was performed. IF YOU HAVE DISABILITY OR FAMILY LEAVE FORMS, YOU MUST BRING THEM TO THE OFFICE FOR PROCESSING.   DO NOT GIVE THEM TO YOUR DOCTOR.  1. A prescription for pain medication may be given to you upon discharge.  Take your pain medication as prescribed, if needed.  If narcotic pain medicine is not needed, then you may take acetaminophen (Tylenol) or ibuprofen (Advil) as needed. 2. Take your usually prescribed medications unless otherwise directed. 3. If you need a refill on your pain medication, please contact your pharmacy.  They will contact our office to request authorization. Prescriptions will not be filled after 5pm or on week-ends. 4. You should follow a light diet the first few days after arrival home, such as soup and crackers, etc.  Be sure to include lots of fluids daily. 5. Most patients will experience some swelling and bruising in the area of the incisions.  Ice packs will help.  Swelling and bruising can take several days to resolve.  6. It is common to experience some constipation if taking pain medication after surgery.  Increasing fluid intake and taking a stool softener (such as Colace) will usually help or prevent this problem from occurring.  A mild laxative (Milk of Magnesia or Miralax) should be taken according to package instructions if there are no bowel movements after 48 hours. 7. Unless discharge instructions indicate otherwise, you may remove your bandages 48 hours after surgery, and you may shower at that time.  You may have steri-strips (small skin tapes) in place directly over the incision.  These strips should be left on the skin for 7-10 days.  8. ACTIVITIES:  You may resume regular (light) daily activities beginning the next day--such as daily self-care, walking, climbing stairs--gradually  increasing activities as tolerated.  You may have sexual intercourse when it is comfortable.  Refrain from any heavy lifting or straining until approved by your doctor. a. You may drive when you are no longer taking prescription pain medication, you can comfortably wear a seatbelt, and you can safely maneuver your car and apply brakes. 9. You should see your doctor in the office for a follow-up appointment approximately 2-3 weeks after your surgery.  Make sure that you call for this appointment within a day or two after you arrive home to insure a convenient appointment time. 10. OTHER INSTRUCTIONS:  WHEN TO CALL YOUR DOCTOR: 1. Fever over 101.0 2. Inability to urinate 3. Continued bleeding from incision. 4. Increased pain, redness, or drainage from the incision. 5. Increasing abdominal pain  The clinic staff is available to answer your questions during regular business hours.  Please dont hesitate to call and ask to speak to one of the nurses for clinical concerns.  If you have a medical emergency, go to the nearest emergency room or call 911.  A surgeon from [facility] is always on call at the hospital. [address] [place], [place], [place]  [address] ? [address], [place], [place]   [address] [phone] ? [phone] ? FAX [phone] Web site: [email or web address]

## 2017-01-18 ENCOUNTER — Telehealth: Payer: Self-pay

## 2017-01-18 NOTE — Telephone Encounter (Addendum)
Transition Care Management Follow-up Telephone Call   Date discharged? 01/17/2017   How have you been since you were released from the hospital? "just sore"   Do you understand why you were in the hospital? yes   Do you understand the discharge instructions? yes   Where were you discharged to? Home, lives with wife    Items Reviewed:  Medications reviewed: no, did not have medications available at time of call.   Allergies reviewed: no  Dietary changes reviewed: yes. Discussed low fat, non-greasy foods.  Referrals reviewed: f/u with surgery   Functional Questionnaire:   Activities of Daily Living (ADLs):   He states they are independent in the following: ambulation, bathing and hygiene, feeding, continence, grooming, toileting and dressing States they require assistance with the following: None   Any transportation issues/concerns?: no   Any patient concerns? no   Confirmed importance and date/time of follow-up visits scheduled yes  Provider Appointment booked with PCP 02/02/2017 @ 11am.   Confirmed with patient if condition begins to worsen call PCP or go to the ER.  Patient was given the office number and encouraged to call back with question or concerns.  : yes

## 2017-01-18 NOTE — Telephone Encounter (Signed)
Called patient to complete TCM and confirm hospital f/u appt. VM on cell phone is not set up. Phone call disconnected/hung up on home phone.

## 2017-02-02 ENCOUNTER — Ambulatory Visit: Payer: Medicare Other | Admitting: Family Medicine

## 2017-02-22 ENCOUNTER — Other Ambulatory Visit: Payer: Self-pay | Admitting: Family Medicine

## 2017-02-28 ENCOUNTER — Telehealth: Payer: Self-pay

## 2017-02-28 NOTE — Telephone Encounter (Signed)
Contacted patient to change upcoming AWV to OV with PCP and reschedule AWV to be with [facility]. Phone disconnected twice. Will continue to attempt to contact. Offering appointment 03/07/17 @ 1130 (after wife's AWV).
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
## 2017-03-07 ENCOUNTER — Ambulatory Visit (INDEPENDENT_AMBULATORY_CARE_PROVIDER_SITE_OTHER): Payer: Medicare Other

## 2017-03-07 VITALS — BP 118/60 | HR 74 | Ht 71.0 in

## 2017-03-07 DIAGNOSIS — Z0000 Encounter for general adult medical examination without abnormal findings: Secondary | ICD-10-CM | POA: Diagnosis not present

## 2017-03-07 MED ORDER — ZOSTER VAC RECOMB ADJUVANTED 50 MCG/0.5ML IM SUSR: 0.5000 mL | Freq: Once | INTRAMUSCULAR | 1 refills | Status: AC

## 2017-03-07 NOTE — Patient Instructions (Addendum)
Bring a copy of your advance directives to your next office visit.  Start doing brain stimulating activities (puzzles, reading, adult coloring books, staying active) to keep memory sharp.   Shingrix Vaccine at pharmacy  Fall Prevention in the Home Falls can cause injuries. They can happen to people of all ages. There are many things you can do to make your home safe and to help prevent falls. What can I do on the outside of my home?  Regularly fix the edges of walkways and driveways and fix any cracks.  Remove anything that might make you trip as you walk through a door, such as a raised step or threshold.  Trim any bushes or trees on the path to your home.  Use bright outdoor lighting.  Clear any walking paths of anything that might make someone trip, such as rocks or tools.  Regularly check to see if handrails are loose or broken. Make sure that both sides of any steps have handrails.  Any raised decks and porches should have guardrails on the edges.  Have any leaves, snow, or ice cleared regularly.  Use sand or salt on walking paths during winter.  Clean up any spills in your garage right away. This includes oil or grease spills. What can I do in the bathroom?  Use night lights.  Install grab bars by the toilet and in the tub and shower. Do not use towel bars as grab bars.  Use non-skid mats or decals in the tub or shower.  If you need to sit down in the shower, use a plastic, non-slip stool.  Keep the floor dry. Clean up any water that spills on the floor as soon as it happens.  Remove soap buildup in the tub or shower regularly.  Attach bath mats securely with double-sided non-slip rug tape.  Do not have throw rugs and other things on the floor that can make you trip. What can I do in the bedroom?  Use night lights.  Make sure that you have a light by your bed that is easy to reach.  Do not use any sheets or blankets that are too big for your bed. They should  not hang down onto the floor.  Have a firm chair that has side arms. You can use this for support while you get dressed.  Do not have throw rugs and other things on the floor that can make you trip. What can I do in the kitchen?  Clean up any spills right away.  Avoid walking on wet floors.  Keep items that you use a lot in easy-to-reach places.  If you need to reach something above you, use a strong step stool that has a grab bar.  Keep electrical cords out of the way.  Do not use floor polish or wax that makes floors slippery. If you must use wax, use non-skid floor wax.  Do not have throw rugs and other things on the floor that can make you trip. What can I do with my stairs?  Do not leave any items on the stairs.  Make sure that there are handrails on both sides of the stairs and use them. Fix handrails that are broken or loose. Make sure that handrails are as long as the stairways.  Check any carpeting to make sure that it is firmly attached to the stairs. Fix any carpet that is loose or worn.  Avoid having throw rugs at the top or bottom of the stairs. If you  do have throw rugs, attach them to the floor with carpet tape.  Make sure that you have a light switch at the top of the stairs and the bottom of the stairs. If you do not have them, ask someone to add them for you. What else can I do to help prevent falls?  Wear shoes that: ? Do not have high heels. ? Have rubber bottoms. ? Are comfortable and fit you well. ? Are closed at the toe. Do not wear sandals.  If you use a stepladder: ? Make sure that it is fully opened. Do not climb a closed stepladder. ? Make sure that both sides of the stepladder are locked into place. ? Ask someone to hold it for you, if possible.  Clearly mark and make sure that you can see: ? Any grab bars or handrails. ? First and last steps. ? Where the edge of each step is.  Use tools that help you move around (mobility aids) if they are  needed. These include: ? Canes. ? Walkers. ? Scooters. ? Crutches.  Turn on the lights when you go into a dark area. Replace any light bulbs as soon as they burn out.  Set up your furniture so you have a clear path. Avoid moving your furniture around.  If any of your floors are uneven, fix them.  If there are any pets around you, be aware of where they are.  Review your medicines with your doctor. Some medicines can make you feel dizzy. This can increase your chance of falling. Ask your doctor what other things that you can do to help prevent falls. This information is not intended to replace advice given to you by your health care provider. Make sure you discuss any questions you have with your health care provider. Document Released: 06/11/2009 Document Revised: 01/21/2016 Document Reviewed: 09/19/2014 Elsevier Interactive Patient Education  2018 [place] Maintenance, Male A healthy lifestyle and preventive care is important for your health and wellness. Ask your health care provider about what schedule of regular examinations is right for you. What should I know about weight and diet? Eat a Healthy Diet  Eat plenty of vegetables, fruits, whole grains, low-fat dairy products, and lean protein.  Do not eat a lot of foods high in solid fats, added sugars, or salt.  Maintain a Healthy Weight Regular exercise can help you achieve or maintain a healthy weight. You should:  Do at least 150 minutes of exercise each week. The exercise should increase your heart rate and make you sweat (moderate-intensity exercise).  Do strength-training exercises at least twice a week.  Watch Your Levels of Cholesterol and Blood Lipids  Have your blood tested for lipids and cholesterol every 5 years starting at 81 years of age. If you are at high risk for heart disease, you should start having your blood tested when you are 81 years old. You may need to have your cholesterol levels  checked more often if: ? Your lipid or cholesterol levels are high. ? You are older than 81 years of age. ? You are at high risk for heart disease.  What should I know about cancer screening? Many types of cancers can be detected early and may often be prevented. Lung Cancer  You should be screened every year for lung cancer if: ? You are a current smoker who has smoked for at least 30 years. ? You are a former smoker who has quit within the past 15  years.  Talk to your health care provider about your screening options, when you should start screening, and how often you should be screened.  Colorectal Cancer  Routine colorectal cancer screening usually begins at 81 years of age and should be repeated every 5-10 years until you are 81 years old. You may need to be screened more often if early forms of precancerous polyps or small growths are found. Your health care provider may recommend screening at an earlier age if you have risk factors for colon cancer.  Your health care provider may recommend using home test kits to check for hidden blood in the stool.  A small camera at the end of a tube can be used to examine your colon (sigmoidoscopy or colonoscopy). This checks for the earliest forms of colorectal cancer.  Prostate and Testicular Cancer  Depending on your age and overall health, your health care provider may do certain tests to screen for prostate and testicular cancer.  Talk to your health care provider about any symptoms or concerns you have about testicular or prostate cancer.  Skin Cancer  Check your skin from head to toe regularly.  Tell your health care provider about any new moles or changes in moles, especially if: ? There is a change in a mole's size, shape, or color. ? You have a mole that is larger than a pencil eraser.  Always use sunscreen. Apply sunscreen liberally and repeat throughout the day.  Protect yourself by wearing long sleeves, pants, a  wide-brimmed hat, and sunglasses when outside.  What should I know about heart disease, diabetes, and high blood pressure?  If you are 62-1 years of age, have your blood pressure checked every 3-5 years. If you are 61 years of age or older, have your blood pressure checked every year. You should have your blood pressure measured twice-once when you are at a hospital or clinic, and once when you are not at a hospital or clinic. Record the average of the two measurements. To check your blood pressure when you are not at a hospital or clinic, you can use: ? An automated blood pressure machine at a pharmacy. ? A home blood pressure monitor.  Talk to your health care provider about your target blood pressure.  If you are between 24-62 years old, ask your health care provider if you should take aspirin to prevent heart disease.  Have regular diabetes screenings by checking your fasting blood sugar level. ? If you are at a normal weight and have a low risk for diabetes, have this test once every three years after the age of 64. ? If you are overweight and have a high risk for diabetes, consider being tested at a younger age or more often.  A one-time screening for abdominal aortic aneurysm (AAA) by ultrasound is recommended for men aged 86-75 years who are current or former smokers. What should I know about preventing infection? Hepatitis B If you have a higher risk for hepatitis B, you should be screened for this virus. Talk with your health care provider to find out if you are at risk for hepatitis B infection. Hepatitis C Blood testing is recommended for:  Everyone born from [birth year] through 1963.  Anyone with known risk factors for hepatitis C.  Sexually Transmitted Diseases (STDs)  You should be screened each year for STDs including gonorrhea and chlamydia if: ? You are sexually active and are younger than 81 years of age. ? You are older than 81 years  of age and your health care provider  tells you that you are at risk for this type of infection. ? Your sexual activity has changed since you were last screened and you are at an increased risk for chlamydia or gonorrhea. Ask your health care provider if you are at risk.  Talk with your health care provider about whether you are at high risk of being infected with HIV. Your health care provider may recommend a prescription medicine to help prevent HIV infection.  What else can I do?  Schedule regular health, dental, and eye exams.  Stay current with your vaccines (immunizations).  Do not use any tobacco products, such as cigarettes, chewing tobacco, and e-cigarettes. If you need help quitting, ask your health care provider.  Limit alcohol intake to no more than 2 drinks per day. One drink equals 12 ounces of beer, 5 ounces of wine, or 1 ounces of hard liquor.  Do not use street drugs.  Do not share needles.  Ask your health care provider for help if you need support or information about quitting drugs.  Tell your health care provider if you often feel depressed.  Tell your health care provider if you have ever been abused or do not feel safe at home. This information is not intended to replace advice given to you by your health care provider. Make sure you discuss any questions you have with your health care provider. Document Released: 02/11/2008 Document Revised: 04/13/2016 Document Reviewed: 05/19/2015 Elsevier Interactive Patient Education  [name].

## 2017-03-10 ENCOUNTER — Ambulatory Visit: Payer: Medicare Other | Admitting: Family Medicine

## 2017-06-07 IMAGING — CT CT ANKLE*R* W/O CM
4 series · 15 of 33 positions shown, 18 images · non-contrast
Comparison: None.

CLINICAL DATA: Status post fall 06/04/2016 with of right ankle
fracture. Pain. History of open reduction internal fixation of ankle
fracture 20 years ago.

EXAM:
CT OF THE RIGHT ANKLE WITHOUT CONTRAST
TECHNIQUE: Multidetector CT imaging of the right ankle was performed according
to the standard protocol. Multiplanar CT image reconstructions were
also generated.

[Series 3: foot st · axial · 0.36mm/px · z∈[-1335,-1191]mm · 6 of 102 slices shown, 8 images]
[im 15/102  soft-tissue]
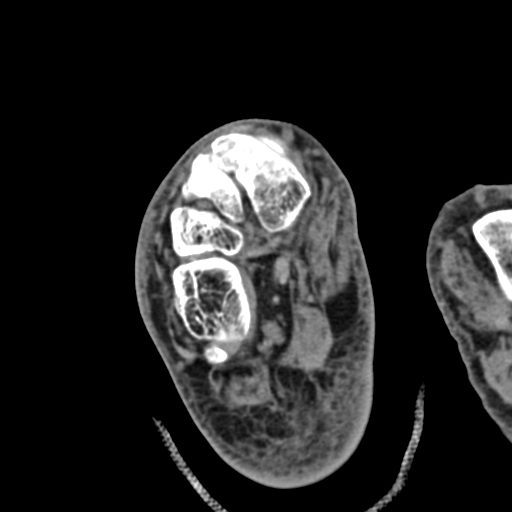
[im 15/102  bone]
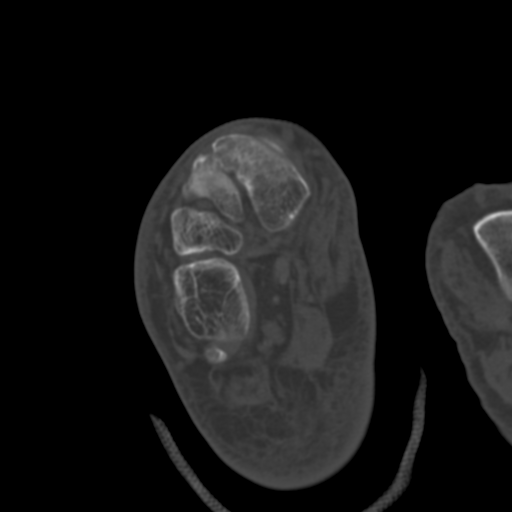
[im 29/102  bone]
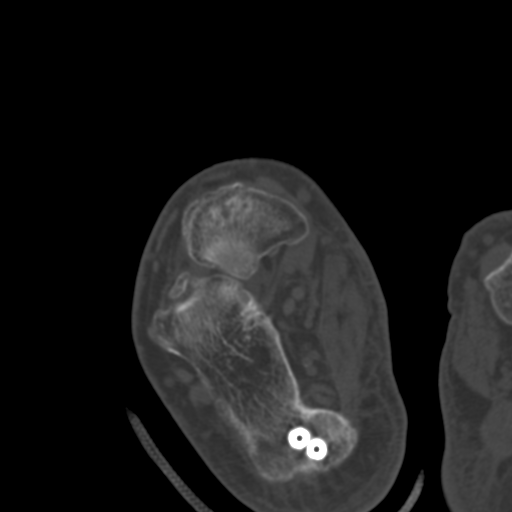
[im 44/102  bone]
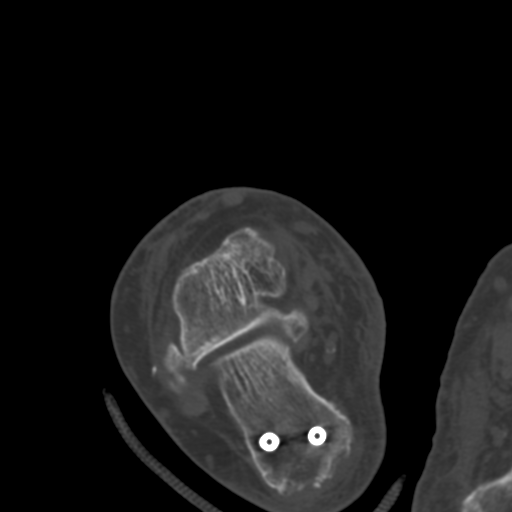
[im 58/102  bone]
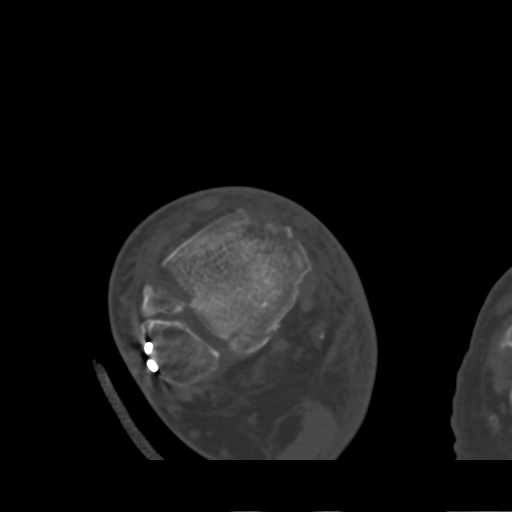
[im 73/102  soft-tissue]
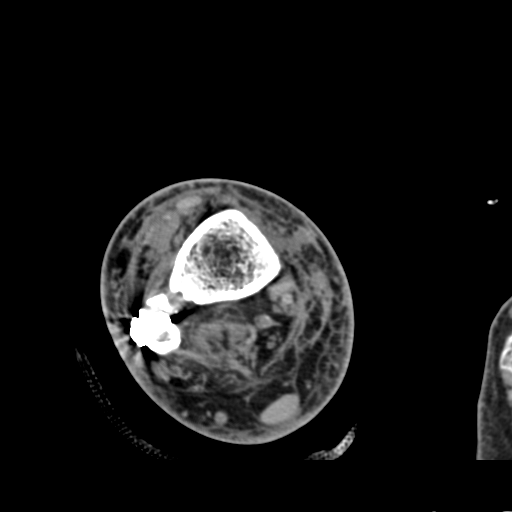
[im 73/102  bone]
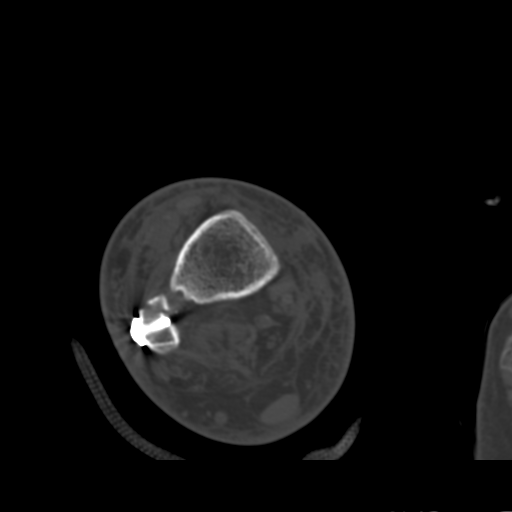
[im 87/102  bone]
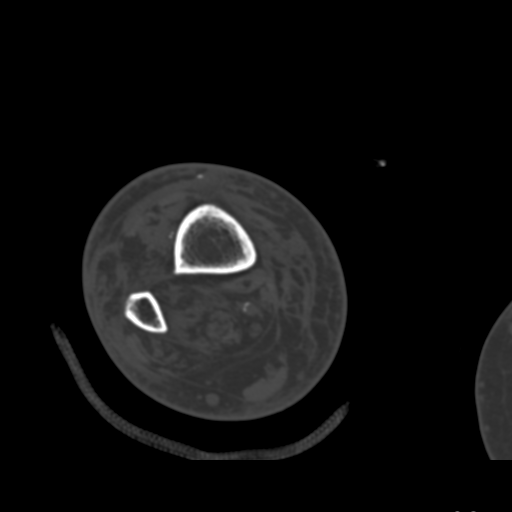

[Series 604: ang.axials · axial · 0.40mm/px · 1 of 68 slices shown]
[im 14/68  bone]
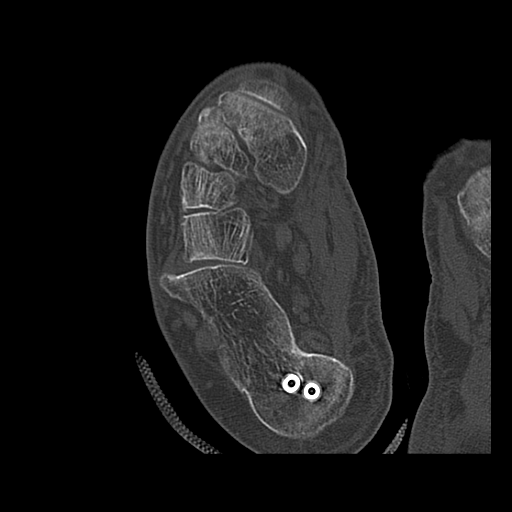

[Series 605: st cor · coronal · 0.40mm/px · 3 of 76 slices shown]
[im 16/76  bone]
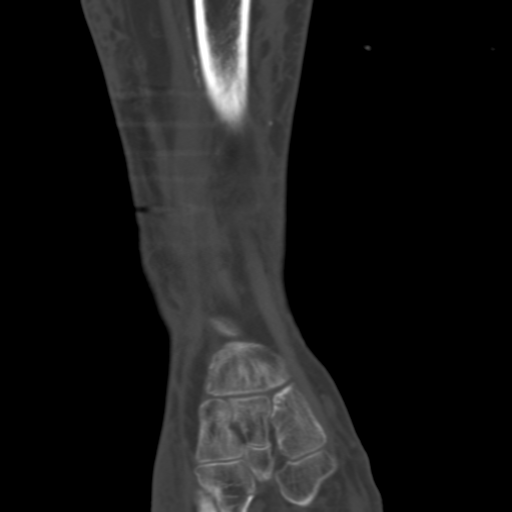
[im 31/76  bone]
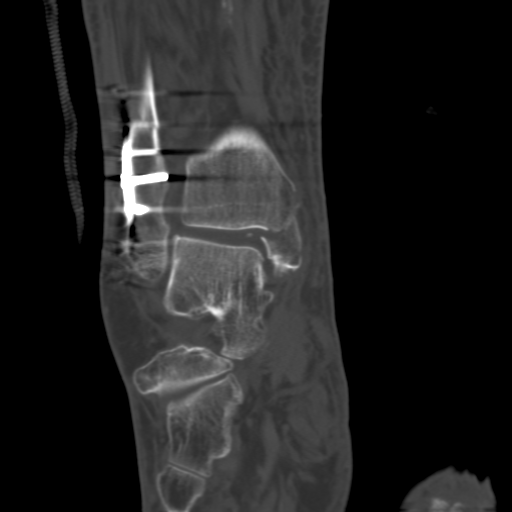
[im 46/76  bone]
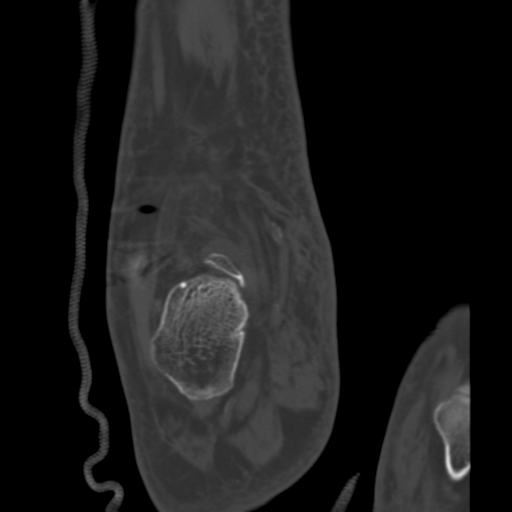

[Series 606: st sag · sagittal · 0.40mm/px · 5 of 54 slices shown, 6 images]
[im 18/54  bone]
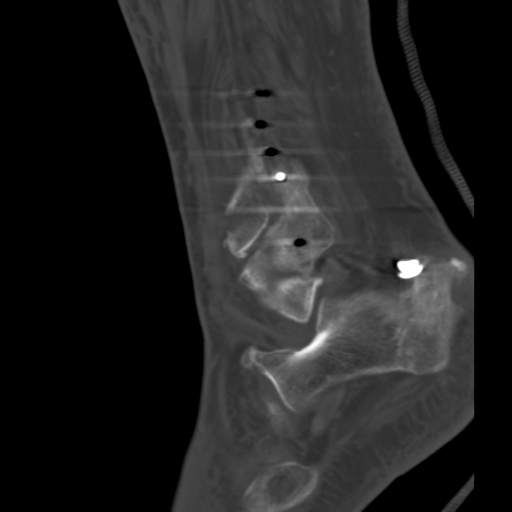
[im 23/54  bone]
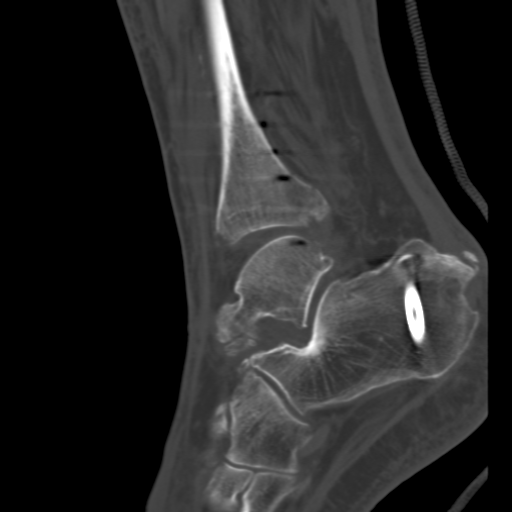
[im 27/54  soft-tissue]
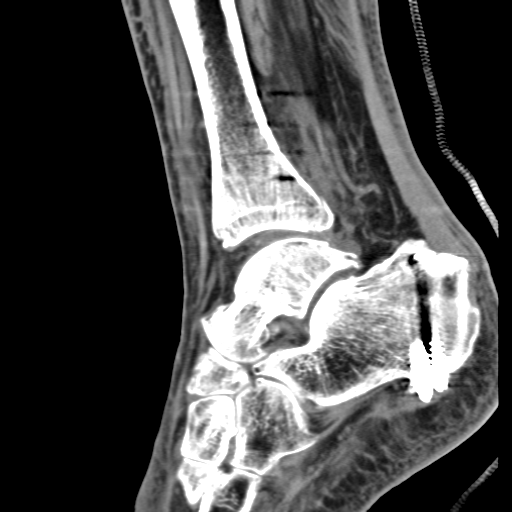
[im 27/54  bone]
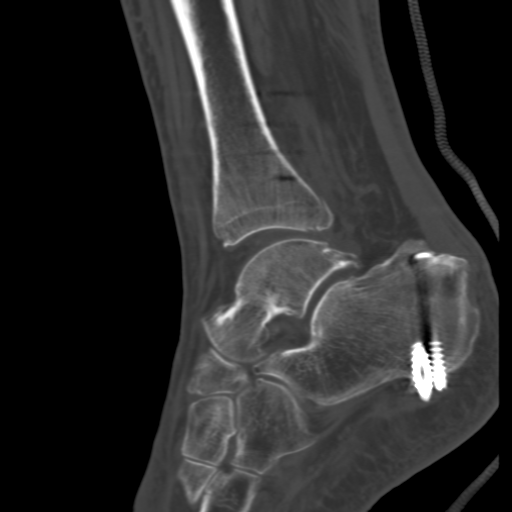
[im 31/54  bone]
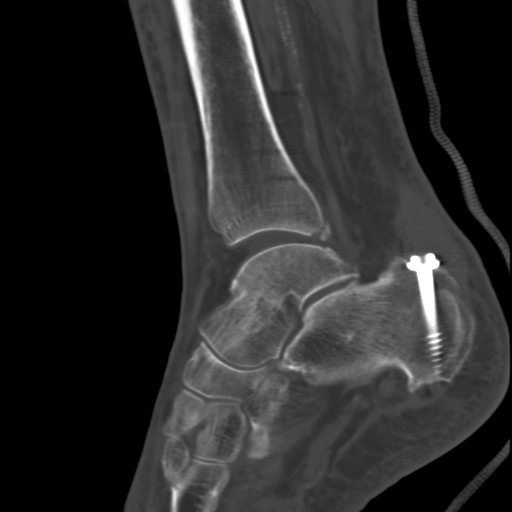
[im 36/54  bone]
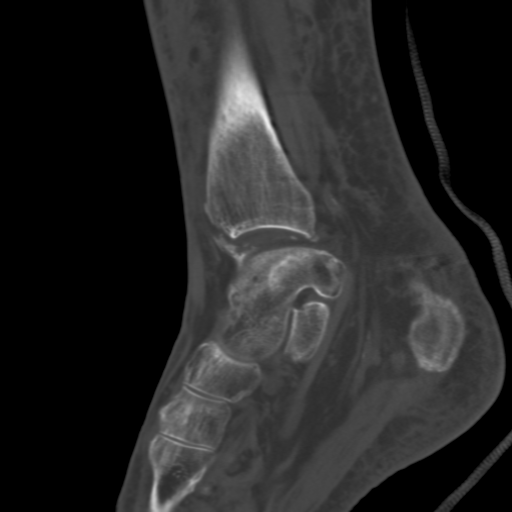

[15 of 33 positions shown; findings below may reference images not displayed]

FINDINGS: Bones/Joint/Cartilage

Interfragmentary screw and plate and screw fixation of a remote
distal fibular fracture is identified. The patient has an acute
fracture of the fibula. The fracture originates in the anterior
cortex just below the most superior screw and extends distally to
approximately the level of the interfragmentary screw. The fracture
has a transverse component between the superior most screws. This
fracture is essentially nondisplaced.

There is an acute fracture of the anterior, lateral tibial plafond.
The fracture fragment measures 1.9 cm AP by approximately 1 cm
transverse at the plafond by 2.4 cm craniocaudal. This fracture
fragment is laterally displaced up to 1.1 cm.

There is also fracture of the medial malleolus with approximately
0.6 cm lateral displacement of the fracture fragment.

Finally, a nondisplaced fracture of the lateral corner of the
posterior malleolus includes a fragment which measures 1.2 cm
transverse by 0.9 cm AP by up to 1.1 cm craniocaudal.

No other fracture is identified. Two screws in the calcaneus are
likely from prior healed calcaneal osteotomy. Bones are somewhat
osteopenic.

Ligaments

Suboptimally assessed by CT.

Muscles and Tendons

No tendon entrapment is identified. Imaged musculature appears
atrophic.

Soft tissues

Swelling and hematoma are present about the patient's ankle.
IMPRESSION: Fractures of the distal fibula, medial malleolus, lateral corner of
the posterior malleolus and anterior corner of the lateral plafond
as described above.

## 2017-06-15 LAB — BASIC METABOLIC PANEL
Creatinine: 0.9 (ref 0.6–1.3)
Potassium: 4.6 (ref 3.4–5.3)
Sodium: 138 (ref 137–147)

## 2017-06-15 LAB — HEMOGLOBIN A1C: Hemoglobin A1C: 7.2

## 2017-06-16 ENCOUNTER — Emergency Department (HOSPITAL_COMMUNITY): Payer: Medicare Other

## 2017-06-16 ENCOUNTER — Emergency Department (HOSPITAL_COMMUNITY)
Admission: EM | Admit: 2017-06-16 | Discharge: 2017-06-16 | Disposition: A | Discharge status: Home / Self Care | Payer: Medicare Other | Attending: Emergency Medicine | Admitting: Emergency Medicine

## 2017-06-16 ENCOUNTER — Encounter (HOSPITAL_COMMUNITY): Payer: Self-pay | Admitting: Emergency Medicine

## 2017-06-16 DIAGNOSIS — S82024A Nondisplaced longitudinal fracture of right patella, initial encounter for closed fracture: Secondary | ICD-10-CM | POA: Diagnosis not present

## 2017-06-16 DIAGNOSIS — Z794 Long term (current) use of insulin: Secondary | ICD-10-CM | POA: Insufficient documentation

## 2017-06-16 DIAGNOSIS — Z79899 Other long term (current) drug therapy: Secondary | ICD-10-CM | POA: Diagnosis not present

## 2017-06-16 DIAGNOSIS — W19XXXA Unspecified fall, initial encounter: Secondary | ICD-10-CM | POA: Insufficient documentation

## 2017-06-16 DIAGNOSIS — I10 Essential (primary) hypertension: Secondary | ICD-10-CM | POA: Insufficient documentation

## 2017-06-16 DIAGNOSIS — E1121 Type 2 diabetes mellitus with diabetic nephropathy: Secondary | ICD-10-CM | POA: Diagnosis not present

## 2017-06-16 DIAGNOSIS — Y999 Unspecified external cause status: Secondary | ICD-10-CM | POA: Diagnosis not present

## 2017-06-16 DIAGNOSIS — Y929 Unspecified place or not applicable: Secondary | ICD-10-CM | POA: Diagnosis not present

## 2017-06-16 DIAGNOSIS — Y939 Activity, unspecified: Secondary | ICD-10-CM | POA: Insufficient documentation

## 2017-06-16 DIAGNOSIS — S8991XA Unspecified injury of right lower leg, initial encounter: Secondary | ICD-10-CM | POA: Diagnosis not present

## 2017-06-16 DIAGNOSIS — T148XXA Other injury of unspecified body region, initial encounter: Secondary | ICD-10-CM | POA: Diagnosis not present

## 2017-06-16 DIAGNOSIS — M25561 Pain in right knee: Secondary | ICD-10-CM | POA: Diagnosis not present

## 2017-06-16 MED ORDER — MORPHINE SULFATE (PF) 4 MG/ML IV SOLN
4.0000 mg | Freq: Once | INTRAVENOUS | Status: AC
  Administered 2017-06-16: 4 mg via INTRAVENOUS
  Filled 2017-06-16: qty 1

## 2017-06-16 MED ORDER — MORPHINE SULFATE (PF) 4 MG/ML IV SOLN
4.0000 mg | Freq: Once | INTRAVENOUS | Status: DC
  Administered 2017-06-16: 4 mg via INTRAMUSCULAR
  Filled 2017-06-16: qty 1

## 2017-06-16 MED ORDER — HYDROMORPHONE HCL 2 MG PO TABS: 2.0000 mg | ORAL_TABLET | Freq: Two times a day (BID) | ORAL | 0 refills | Status: DC | PRN

## 2017-06-16 MED ORDER — MORPHINE SULFATE (PF) 4 MG/ML IV SOLN
4.0000 mg | Freq: Once | INTRAVENOUS | Status: AC
  Administered 2017-06-16: 4 mg via INTRAVENOUS

## 2017-06-16 NOTE — ED Notes (Signed)
Bed: [facility] Expected date:  Expected time:  Means of arrival:  Comments: 
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
## 2017-06-16 NOTE — ED Notes (Signed)
Off floor for testing 

## 2017-06-16 NOTE — ED Triage Notes (Signed)
Per EMS-states chronic knee pain/past injury-states right knee gave out last night and he fell onto right knee--increased pain with movement

## 2017-06-19 DIAGNOSIS — M25561 Pain in right knee: Secondary | ICD-10-CM | POA: Diagnosis not present

## 2017-06-19 DIAGNOSIS — S82091A Other fracture of right patella, initial encounter for closed fracture: Secondary | ICD-10-CM | POA: Diagnosis not present

## 2017-07-12 DIAGNOSIS — M79671 Pain in right foot: Secondary | ICD-10-CM | POA: Diagnosis not present

## 2017-07-12 DIAGNOSIS — S82091D Other fracture of right patella, subsequent encounter for closed fracture with routine healing: Secondary | ICD-10-CM | POA: Diagnosis not present

## 2017-07-12 DIAGNOSIS — M25471 Effusion, right ankle: Secondary | ICD-10-CM | POA: Diagnosis not present

## 2017-07-17 ENCOUNTER — Telehealth: Payer: Self-pay | Admitting: Family Medicine

## 2017-07-17 NOTE — Telephone Encounter (Signed)
Patient said that this is not in his knee, that it is his legs.  I informed him that he would need an appointment to be seen to make sure that we are treating this properly with the right dosage.   He stats that he is capable of telling us what dose he needs (40mg  in the AM, 40mg  in the PM).  He states that he does not need an appointment for this.   He said that the [place] will send him lasix, but it can take a couple weeks and that's why he wanted [place] to just send him some in.   I again tried to explain to patient that with this being new onset we would like to see him to make sure everything is okay.  He states that he has taken this medication on and off for 10 years, so he knows what he needs.   He still would not make an appointment.

## 2017-07-17 NOTE — Telephone Encounter (Signed)
If pt is having swelling linked to his recent orthopedic injury, I recommend he see them.  If he is interested in getting Lasix (or other medication) from this office, we would need to see him for an evaluation to make sure this is appropriate.  Lasix doesn't usually improve joint swelling- typically lower leg/foot/ankle swelling

## 2017-07-17 NOTE — Telephone Encounter (Signed)
I have not prescribed this for him and if he wants me to send in the prescription, I need him to schedule an appt.

## 2017-07-17 NOTE — Telephone Encounter (Signed)
Scheduled with Dr Jonni Sanger for 11/20

## 2017-07-17 NOTE — Telephone Encounter (Signed)
Would patient need to get this from Ortho, or is this something you want to do?

## 2017-07-17 NOTE — Telephone Encounter (Signed)
Pt. States he broke his knee cap 6 weeks ago and is out of his cast. C/o swelling to both knees - requests a 30 day refill of Lasix. States orthopedic doctor knows about the swelling. I told pt. He would probably need an office visit, but he request we ask doctor for the Lasix. It is not noted on his medication list.

## 2017-07-17 NOTE — Telephone Encounter (Signed)
Copied from [place] [phone]. Topic: Quick Communication - See Telephone Encounter >> Jul 17, 2017  9:26 AM Robina Ade, Helene Kelp D wrote: CRM for notification. See Telephone encounter for: 07/17/17. Patient would like a medication for retaining fluid for knee. Please call patient back if there are any question.

## 2017-07-17 NOTE — Telephone Encounter (Signed)
Reviewed messages 

## 2017-07-18 ENCOUNTER — Ambulatory Visit (INDEPENDENT_AMBULATORY_CARE_PROVIDER_SITE_OTHER): Payer: Medicare Other | Admitting: Family Medicine

## 2017-07-18 ENCOUNTER — Encounter: Payer: Self-pay | Admitting: Family Medicine

## 2017-07-18 VITALS — BP 110/68 | HR 83 | Temp 99.1°F | Resp 17

## 2017-07-18 DIAGNOSIS — R600 Localized edema: Secondary | ICD-10-CM | POA: Diagnosis not present

## 2017-07-18 DIAGNOSIS — E1121 Type 2 diabetes mellitus with diabetic nephropathy: Secondary | ICD-10-CM

## 2017-07-18 DIAGNOSIS — I10 Essential (primary) hypertension: Secondary | ICD-10-CM | POA: Diagnosis not present

## 2017-07-18 DIAGNOSIS — Z993 Dependence on wheelchair: Secondary | ICD-10-CM | POA: Insufficient documentation

## 2017-07-18 DIAGNOSIS — Z8781 Personal history of (healed) traumatic fracture: Secondary | ICD-10-CM

## 2017-07-18 MED ORDER — FUROSEMIDE 40 MG PO TABS: 40.0000 mg | ORAL_TABLET | Freq: Every day | ORAL | 1 refills | Status: DC

## 2017-07-18 MED ORDER — POTASSIUM CHLORIDE CRYS ER 20 MEQ PO TBCR: 20.0000 meq | EXTENDED_RELEASE_TABLET | Freq: Every day | ORAL | 1 refills | Status: DC

## 2017-07-18 NOTE — Patient Instructions (Signed)
Please start taking the lasix (fluid pill) daily with the potassium pill. Elevate your legs when possible.  Avoid eating salty foods over the holidays.  Call if the skin becomes hot and red.     Peripheral Edema Peripheral edema is swelling that is caused by a buildup of fluid. Peripheral edema most often affects the lower legs, ankles, and feet. It can also develop in the arms, hands, and face. The area of the body that has peripheral edema will look swollen. It may also feel heavy or warm. Your clothes may start to feel tight. Pressing on the area may make a temporary dent in your skin. You may not be able to move your arm or leg as much as usual. There are many causes of peripheral edema. It can be a complication of other diseases, such as congestive heart failure, kidney disease, or a problem with your blood circulation. It also can be a side effect of certain medicines. It often happens to women during pregnancy. Sometimes, the cause is not known. Treating the underlying condition is often the only treatment for peripheral edema. Follow these instructions at home: Pay attention to any changes in your symptoms. Take these actions to help with your discomfort:  Raise (elevate) your legs while you are sitting or lying down.  Move around often to prevent stiffness and to lessen swelling. Do not sit or stand for long periods of time.  Wear support stockings as told by your health care provider.  Follow instructions from your health care provider about limiting salt (sodium) in your diet. Sometimes eating less salt can reduce swelling.  Take over-the-counter and prescription medicines only as told by your health care provider. Your health care provider may prescribe medicine to help your body get rid of excess water (diuretic).  Keep all follow-up visits as told by your health care provider. This is important.  Contact a health care provider if:  You have a fever.  Your edema starts  suddenly or is getting worse, especially if you are pregnant or have a medical condition.  You have swelling in only one leg.  You have increased swelling and pain in your legs. Get help right away if:  You develop shortness of breath, especially when you are lying down.  You have pain in your chest or abdomen.  You feel weak.  You faint. This information is not intended to replace advice given to you by your health care provider. Make sure you discuss any questions you have with your health care provider. Document Released: 09/22/2004 Document Revised: 01/18/2016 Document Reviewed: 02/25/2015 Elsevier Interactive Patient Education  [name].

## 2017-07-18 NOTE — Progress Notes (Signed)
Subjective   CC:  Chief Complaint  Patient presents with  . Leg Swelling    HPI: [name] is a 83 y.o. male who presents to the office today to address the problems listed above in the chief complaint.  83 year old male with complex medical history presents with bilateral lower extremity swelling for approximately 4-5 weeks.  He sustained an anterior vertical patellar fracture on October 18.  He was followed by [facility] and treated with a knee immobilizer for 4 weeks.  This was removed about 1-2 weeks ago.  During this timeframe he had noted increasing swelling in both lower extremities.  His follow-up with orthopedics has been unremarkable.  He was recently told that the patella fracture had healed well.  He does admit to some mild redness in the lower extremities but denies hot warm skin or weeping skin.  He denies calf pain.  He denies shortness of breath dyspnea with lying back or history of heart failure.  He reports a low-sodium diet  He reports using Lasix for many years 1-2 years ago.  It sounds like this was most likely due to dependent edema.  He is morbidly obese and is wheelchair-bound.  He tries to elevate his legs when he can.  He does not use compression hose  Much of his care through the [facility] hospital.  I have reviewed a recent lab results for patient and this was scanned in the chart.  Of note sodium potassium and creatinine are all normal. I reviewed the patients updated PMH, FH, and SocHx.    Patient Active Problem List   Diagnosis Date Noted  . Wheelchair confinement status 07/18/2017  . Bilateral lower extremity edema 07/18/2017  . Cholecystitis 01/16/2017  . AKI (acute kidney injury) ([place]) 12/17/2016  . UTI (urinary tract infection) 12/17/2016  . Cystitis 12/17/2016  . Cholecystostomy tube dysfunction 08/15/2016  . PE (physical exam), annual 08/15/2016  . Depression 08/15/2016  . GERD (gastroesophageal reflux disease) 08/15/2016  . HLD  (hyperlipidemia) 08/15/2016  . Abdominal pain 08/15/2016  . Sepsis secondary to UTI ([place]) 08/15/2016  . Elevated liver function tests   . Closed right fibular fracture 07/27/2016  . Emphysematous cholecystitis s/p perc cholecystostomy drain 07/21/2016 07/20/2016  . Pressure injury of skin 07/20/2016  . Hx of pulmonary embolus 07/20/2016  . Hyponatremia 07/20/2016  . Urinary retention   . Type 2 diabetes with nephropathy ([place])   . Pneumobilia 02/13/2016  . Chronic post-traumatic stress disorder (PTSD) 11/30/2015  . Chronic pain syndrome 11/30/2015  . Morbid obesity due to excess calories ([place]) 11/30/2015  . Nocturia more than twice per night 11/30/2015  . Closed displaced bimalleolar fracture of right ankle 02/20/2015  . Duodenal ulcer, perforated s/p repair/omental patch 11/26/2012  . Helicobacter pylori gastritis 11/26/2012  . Leukocytosis 11/12/2012  . Lumbosacral radiculopathy at L5 11/12/2012  . Herniated lumbar intervertebral disc 11/12/2012  . Essential hypertension, benign 11/12/2012  . Unspecified constipation 11/12/2012  . History of back surgery 11/09/2012  . Obesity 11/09/2012   Current Meds  Medication Sig  . ACCU-CHEK FASTCLIX LANCETS MISC Use one lancet each time sugars are tested. Pt checks sugars 4 times daily.  . Blood Glucose Monitoring Suppl (ACCU-CHEK AVIVA PLUS) w/Device KIT Use glucometer to test sugars 4 times daily. Dx. E11.9  . Carboxymethylcellulose Sodium (THERATEARS OP) Apply 1 drop to eye 3 (three) times daily as needed (dry eyes).  . carvedilol (COREG) 3.125 MG tablet TAKE 1 TABLET TWICE A DAY WITH MEALS  .  Cholecalciferol (VITAMIN D3) 2000 units capsule Take 2,000 Units by mouth 2 (two) times daily.   . cyanocobalamin 500 MCG tablet Take 500 mcg by mouth 2 (two) times daily.  Marland Kitchen galantamine (RAZADYNE ER) 16 MG 24 hr capsule Take 16 mg by mouth daily with breakfast.  . glucose blood (ACCU-CHEK AVIVA PLUS) test strip Use one strip to test sugars. Pt  checks sugars 4 times daily. Dx. E11.9  . HYDROmorphone (DILAUDID) 2 MG tablet Take 1 tablet (2 mg total) by mouth 2 (two) times daily as needed for severe pain.  Marland Kitchen insulin detemir (LEVEMIR) 100 UNIT/ML injection Inject 0.2 mLs (20 Units total) into the skin at bedtime.  . insulin glargine (LANTUS) 100 UNIT/ML injection Inject 20 Units into the skin at bedtime.  . insulin lispro (HUMALOG) 100 UNIT/ML injection Inject 0-5 Units into the skin 3 (three) times daily before meals. 0-200 = 0 units, 201-250 = 2 units, 251-300 = 3 units, 301-350 = 4 units, 351-400 = 5 units, >401 call MD/NP  . lactose free nutrition (BOOST) LIQD Take 237 mLs by mouth daily as needed (nutrition).  . Lancets Misc. (ACCU-CHEK FASTCLIX LANCET) KIT Use one lancet each time sugars are tested. Pt checks sugars 4 times daily.  . magnesium oxide (MAG-OX) 400 MG tablet Take 400 mg by mouth 2 (two) times daily.  . Melatonin 5 MG TABS Take 5 mg by mouth at bedtime.  . methocarbamol (ROBAXIN) 750 MG tablet Take 1 tablet (750 mg total) by mouth every 6 (six) hours as needed for muscle spasms.  . Multiple Vitamins-Minerals (PRESERVISION AREDS 2 PO) Take 1 capsule by mouth 2 (two) times daily.  . pantoprazole (PROTONIX) 40 MG tablet Take 1 tablet (40 mg total) by mouth daily.  . polyethylene glycol (MIRALAX / GLYCOLAX) packet Take 17-25.5 g by mouth daily.   . QUEtiapine (SEROQUEL) 50 MG tablet Take 50 mg by mouth at bedtime.   . rosuvastatin (CRESTOR) 40 MG tablet Take 20 mg by mouth every evening.  . tamsulosin (FLOMAX) 0.4 MG CAPS capsule Take 1 capsule (0.4 mg total) by mouth daily after supper.  . traZODone (DESYREL) 50 MG tablet Take 50 mg by mouth at bedtime.  Alveda Reasons 20 MG TABS tablet TAKE 1 TABLET DAILY    Review of Systems: Constitutional: Negative for fever malaise or anorexia Cardiovascular: negative for chest pain Respiratory: negative for SOB or persistent cough Gastrointestinal: negative for abdominal  pain Endocrine: Negative for hyper or hypoglycemia Office Visit on 07/18/2017  Component Date Value Ref Range Status  . Creatinine 06/15/2017 0.9  0.6 - 1.3 Final  . Potassium 06/15/2017 4.6  3.4 - 5.3 Final  . Sodium 06/15/2017 138  137 - 147 Final  . Hemoglobin A1C 06/15/2017 7.2   Final    Objective  Vitals: BP 110/68 (BP Location: Left Arm, Patient Position: Sitting, Cuff Size: Large)   Pulse 83   Temp 99.1 F (37.3 C) (Oral)   Resp 17   SpO2 93%  General: no acute distress, sits comfortably in wheelchair, morbidly obese Psych:  Alert and oriented, normal mood and affect Cardiovascular:  RRR without murmur or gallop.  Bilateral lower extremities with 3+ pitting edema to knee.  Minimal redness no calf tenderness or cords right patella is nontender normal-appearing  Respiratory:  Good breath sounds bilaterally, CTAB with normal respiratory effort Skin:  Warm, no rashes Neurologic:   Mental status is normal  Assessment  1. Bilateral lower extremity edema   2. History of  patellar fracture   3. Wheelchair confinement status   4. Essential hypertension, benign   5. Type 2 diabetes with nephropathy ([place])      Plan   Subacute onset of bilateral lower extremity edema related to dependent edema most likely.  No symptoms of DVT, acute CHF, or acute cellulitis.  Recommended treatment includes daily Lasix at 40 mg daily with 20 mEq of potassium chloride.  Patient to continue low-sodium diet and limit fluid intake.  Elevate legs as much as possible.  I recommend follow-up in 2-3 weeks to recheck swelling in lower extremities and check a BMP to follow potassium and creatinine levels.  Patient has wife demonstrate understanding of instructions  Patella fracture is well-healed no further follow-up indicated at this time  Blood pressure is well controlled monitor for low blood pressure given the addition of Lasix  Type 2 diabetes is fairly well controlled by most recent A1c readings.   Continue diabetic diet.  Will return to monitor renal function on diuretic.  Follow up: Return for follow up leg swelling and medication use/lab work.    Commons side effects, risks, benefits, and alternatives for medications and treatment plan prescribed today were discussed, and the patient expressed understanding of the given instructions. Patient is instructed to call or message via MyChart if he/she has any questions or concerns regarding our treatment plan. No barriers to understanding were identified. We discussed Red Flag symptoms and signs in detail. Patient expressed understanding regarding what to do in case of urgent or emergency type symptoms.   Medication list was reconciled, printed and provided to the patient in AVS. Patient instructions and summary information was reviewed with the patient as documented in the AVS. This note was prepared with assistance of Dragon voice recognition software. Occasional wrong-word or sound-a-like substitutions may have occurred due to the inherent limitations of voice recognition software   Meds ordered this encounter  Medications  . furosemide (LASIX) 40 MG tablet    Sig: Take 1 tablet (40 mg total) daily by mouth.    Dispense:  30 tablet    Refill:  1  . potassium chloride SA (K-DUR,KLOR-CON) 20 MEQ tablet    Sig: Take 1 tablet (20 mEq total) daily by mouth. Take with Lasix    Dispense:  30 tablet    Refill:  1

## 2017-07-24 ENCOUNTER — Telehealth: Payer: Self-pay | Admitting: Family Medicine

## 2017-07-24 NOTE — Telephone Encounter (Signed)
Patient is aware that it is okay to do 1/2 tablet in the afternoon.  He has an appointment scheduled next week for f/u and he is aware to keep that appointment.

## 2017-07-24 NOTE — Telephone Encounter (Signed)
Copied from [place] [phone]. Topic: Quick Communication - See Telephone Encounter >> Jul 17, 2017  9:26 AM Robina Ade, Helene Kelp D wrote: CRM for notification. See Telephone encounter for: 07/17/17. Patient would like a medication for retaining fluid for knee. Please call patient back if there are any question.  Pt also states that he is taking tamsulosin (FLOMAX) 0.4 MG CAPS capsule [[id number]] as well.

## 2017-07-24 NOTE — Telephone Encounter (Signed)
Patient states that his legs and ankles are still swollen. This is NOT his knee!  He was seen by Dr. Jonni Sanger on 11/20 and she started him on lasix.  They have gone down some since starting Lasix 40mg  daily, but not enough.  He states that in the past he has taken 40mg  in the morning, and 20mg  in the afternoon.  He is wondering if he go back to this to get the swelling down more.     Routed to PCP

## 2017-07-24 NOTE — Telephone Encounter (Signed)
Ok for pt to take an additional 1/2 tab of Lasix in the afternoon.  That would be 1 tab (40mg ) in the AM and 1/2 tab (20mg ) in the afternoon.  He will need an appt to recheck electrolytes and kidney function as well as reassess the swelling in 2 weeks (if he doesn't already have an appt scheduled)

## 2017-08-01 ENCOUNTER — Ambulatory Visit (INDEPENDENT_AMBULATORY_CARE_PROVIDER_SITE_OTHER): Payer: Medicare Other | Admitting: Family Medicine

## 2017-08-01 ENCOUNTER — Encounter: Payer: Self-pay | Admitting: Family Medicine

## 2017-08-01 ENCOUNTER — Other Ambulatory Visit: Payer: Self-pay

## 2017-08-01 VITALS — BP 120/72 | HR 68 | Temp 98.5°F | Resp 17 | Ht 71.0 in | Wt 247.0 lb

## 2017-08-01 DIAGNOSIS — E7849 Other hyperlipidemia: Secondary | ICD-10-CM | POA: Diagnosis not present

## 2017-08-01 DIAGNOSIS — R600 Localized edema: Secondary | ICD-10-CM | POA: Diagnosis not present

## 2017-08-01 LAB — LIPID PANEL
Cholesterol: 139 mg/dL (ref 0–200)
HDL: 40.3 mg/dL (ref >39.00)
LDL Cholesterol: 75 mg/dL (ref 0–99)
NONHDL: 99.1
Total CHOL/HDL Ratio: 3
Triglycerides: 122 mg/dL (ref 0.0–149.0)
VLDL: 24.4 mg/dL (ref 0.0–40.0)

## 2017-08-01 LAB — BASIC METABOLIC PANEL
BUN: 55 mg/dL — ABNORMAL HIGH (ref 6–23)
CALCIUM: 9.5 mg/dL (ref 8.4–10.5)
CO2: 29 meq/L (ref 19–32)
Chloride: 98 mEq/L (ref 96–112)
Creatinine, Ser: 1.53 mg/dL — ABNORMAL HIGH (ref 0.40–1.50)
GFR: 46.4 mL/min — AB (ref >60.00)
Glucose, Bld: 148 mg/dL — ABNORMAL HIGH (ref 70–99)
POTASSIUM: 5.2 meq/L — AB (ref 3.5–5.1)
SODIUM: 135 meq/L (ref 135–145)

## 2017-08-01 LAB — CBC WITH DIFFERENTIAL/PLATELET
Basophils Absolute: 0 10*3/uL (ref 0.0–0.1)
Basophils Relative: 0.3 % (ref 0.0–3.0)
EOS PCT: 5.5 % — AB (ref 0.0–5.0)
Eosinophils Absolute: 0.7 10*3/uL (ref 0.0–0.7)
HCT: 41.5 % (ref 39.0–52.0)
Hemoglobin: 13.7 g/dL (ref 13.0–17.0)
LYMPHS ABS: 3 10*3/uL (ref 0.7–4.0)
Lymphocytes Relative: 24.3 % (ref 12.0–46.0)
MCHC: 32.9 g/dL (ref 30.0–36.0)
MCV: 88.8 fl (ref 78.0–100.0)
MONO ABS: 1.1 10*3/uL — AB (ref 0.1–1.0)
Monocytes Relative: 9.2 % (ref 3.0–12.0)
NEUTROS ABS: 7.5 10*3/uL (ref 1.4–7.7)
NEUTROS PCT: 60.7 % (ref 43.0–77.0)
PLATELETS: 288 10*3/uL (ref 150.0–400.0)
RBC: 4.67 Mil/uL (ref 4.22–5.81)
RDW: 13.5 % (ref 11.5–15.5)
WBC: 12.4 10*3/uL — ABNORMAL HIGH (ref 4.0–10.5)

## 2017-08-01 LAB — HEPATIC FUNCTION PANEL
ALBUMIN: 4 g/dL (ref 3.5–5.2)
ALK PHOS: 82 U/L (ref 39–117)
ALT: 9 U/L (ref 0–53)
AST: 13 U/L (ref 0–37)
BILIRUBIN DIRECT: 0.1 mg/dL (ref 0.0–0.3)
Total Bilirubin: 0.5 mg/dL (ref 0.2–1.2)
Total Protein: 6.8 g/dL (ref 6.0–8.3)

## 2017-08-01 MED ORDER — FUROSEMIDE 40 MG PO TABS: ORAL_TABLET | ORAL | 1 refills | Status: DC

## 2017-08-01 MED ORDER — TAMSULOSIN HCL 0.4 MG PO CAPS: 0.4000 mg | ORAL_CAPSULE | Freq: Every day | ORAL | 1 refills | Status: DC

## 2017-08-01 NOTE — Assessment & Plan Note (Signed)
Chronic problem.  Pt was not able to weigh today due to the fact he is wheelchair bound.  Stressed need for healthy diet as he is not able to exercise.  Will continue to follow.

## 2017-08-01 NOTE — Progress Notes (Signed)
   Subjective:    Patient ID: [name], male    DOB: [birth year], 83 y.o.   MRN: [id number]  HPI Edema- pt reports that the swelling of his lower legs is 'so much better than before'.  Taking Lasix 40mg  in the AM and 1/2 tab in the afternoon for a total of 60mg  daily.    Hyperlipidemia- chronic problem, on Crestor 40mg  daily.  Denies abd pain, N/V.  Obesity- pt was not able to weigh today b/c he is wheelchair bound.  His reported weight of 247 is from the [place] but I don't know how accurate this is.  Based on reported weight, BMI is 34.45 but w/ comorbidities of DM, HTN, hyperlipidemia he classifies as morbidly obese.  Review of Systems For ROS see HPI     Objective:   Physical Exam  Constitutional: He is oriented to person, place, and time. He appears well-developed and well-nourished. No distress.  Morbidly obese  HENT:  Head: Normocephalic and atraumatic.  Eyes: Conjunctivae and EOM are normal. Pupils are equal, round, and reactive to light.  Neck: Normal range of motion. Neck supple. No thyromegaly present.  Cardiovascular: Normal rate, regular rhythm, normal heart sounds and intact distal pulses.  No murmur heard. Pulmonary/Chest: Effort normal and breath sounds normal. No respiratory distress.  Abdominal: Soft. Bowel sounds are normal. He exhibits no distension.  Musculoskeletal: He exhibits edema (trace bilateral LE edema).  Lymphadenopathy:    He has no cervical adenopathy.  Neurological: He is alert and oriented to person, place, and time. No cranial nerve deficit.  Wheelchair bound  Skin: Skin is warm and dry.  Psychiatric: He has a normal mood and affect. His behavior is normal.  Vitals reviewed.         Assessment & Plan:

## 2017-08-01 NOTE — Assessment & Plan Note (Signed)
Chronic problem.  Tolerating statin w/o difficulty.  Overdue for labs (likely done at [place] but no record of this).  Check labs.  Adjust meds prn

## 2017-08-01 NOTE — Patient Instructions (Signed)
Schedule your Medicare Wellness Visit with [name] in 6 months and a follow up with me at the same time [facility] notify you of your lab results and make any changes if needed Try and make healthy food choices! Continue the Lasix as directed Call with any questions or concerns Happy Holidays!!!

## 2017-08-01 NOTE — Assessment & Plan Note (Signed)
Much improved since increasing Lasix to 60mg  daily.  Check BMP and adjust dose PRN.  Pt expressed understanding and is in agreement w/ plan.

## 2017-08-02 ENCOUNTER — Other Ambulatory Visit: Payer: Self-pay | Admitting: Family Medicine

## 2017-08-02 DIAGNOSIS — R7989 Other specified abnormal findings of blood chemistry: Secondary | ICD-10-CM

## 2017-08-10 ENCOUNTER — Other Ambulatory Visit (INDEPENDENT_AMBULATORY_CARE_PROVIDER_SITE_OTHER): Payer: Medicare Other

## 2017-08-10 DIAGNOSIS — R7989 Other specified abnormal findings of blood chemistry: Secondary | ICD-10-CM

## 2017-08-10 LAB — BASIC METABOLIC PANEL
BUN: 38 mg/dL — AB (ref 6–23)
CO2: 33 meq/L — AB (ref 19–32)
CREATININE: 1.42 mg/dL (ref 0.40–1.50)
Calcium: 8.7 mg/dL (ref 8.4–10.5)
Chloride: 99 mEq/L (ref 96–112)
GFR: 50.57 mL/min — ABNORMAL LOW (ref >60.00)
Glucose, Bld: 200 mg/dL — ABNORMAL HIGH (ref 70–99)
POTASSIUM: 4.6 meq/L (ref 3.5–5.1)
Sodium: 136 mEq/L (ref 135–145)

## 2017-08-11 ENCOUNTER — Other Ambulatory Visit: Payer: Self-pay | Admitting: Family Medicine

## 2017-08-11 ENCOUNTER — Encounter: Payer: Self-pay | Admitting: General Practice

## 2017-08-11 DIAGNOSIS — R7989 Other specified abnormal findings of blood chemistry: Secondary | ICD-10-CM

## 2017-08-26 IMAGING — XA IR CHOLANGIOGRAM VIA EXIST CATHETER
2 series · 6 of 6 positions shown · non-contrast
Comparison: none

INDICATION: 84-year-old male with percutaneous cholecystostomy 07/21/2016 for
treatment of acute cholecystitis.

[Series 1: fl angio · 4 of 137 frames shown]
[frame 10/137]
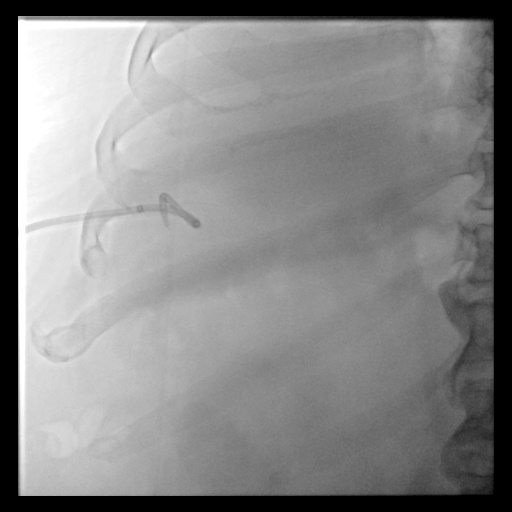
[frame 21/137]
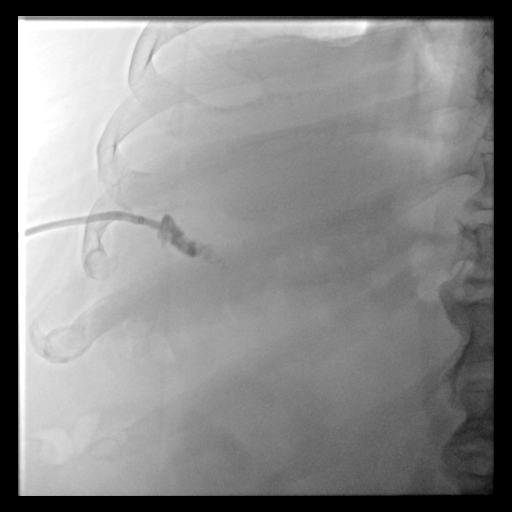
[frame 69/137]
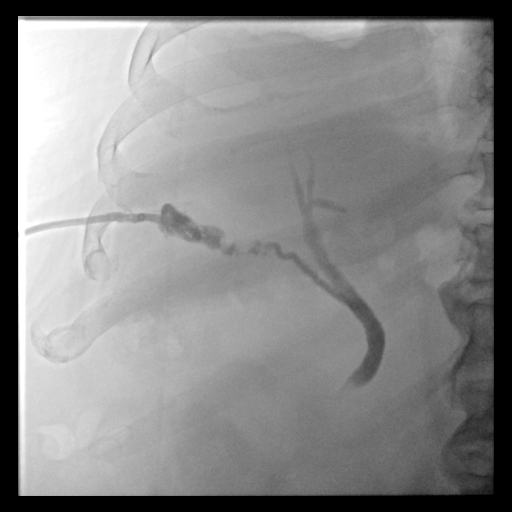
[frame 117/137]
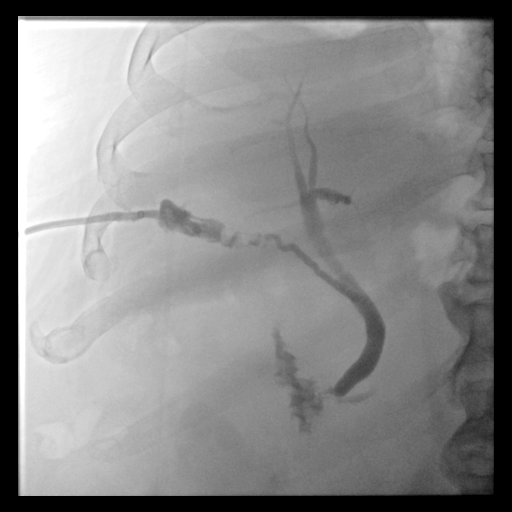

[Series 300: ir catheter tube change · 2 of 2 slices shown]
[im 1/2]
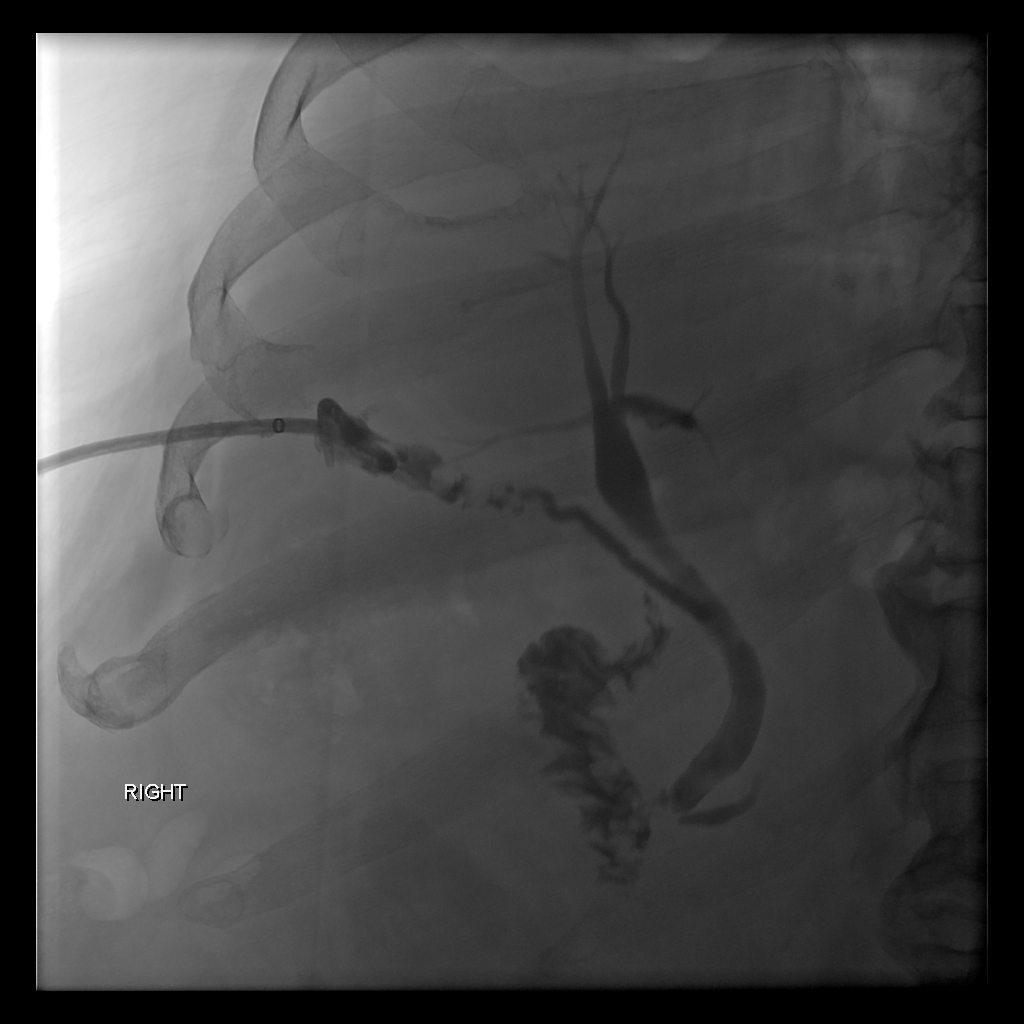
[im 2/2]
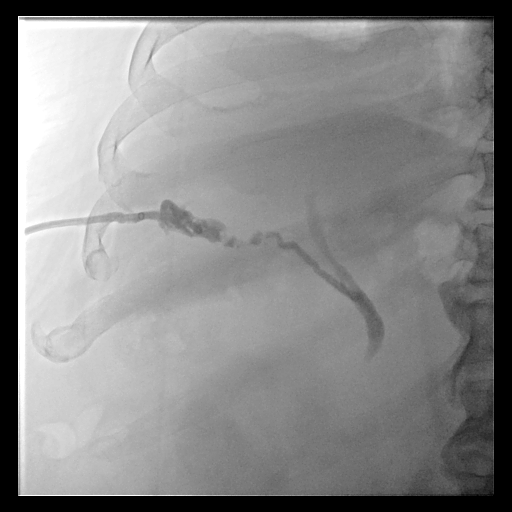

[6 of 6 positions shown; findings below may reference images not displayed]

He has returned today for evaluation, with the complaint of no
drainage from the catheter. He continues to flush the catheter twice
a day.

EXAM:
CHOLANGIOGRAM VIA EXISTING CATHETER

MEDICATIONS:
None

ANESTHESIA/SEDATION:
None

FLUOROSCOPY TIME:  Fluoroscopy Time: 0 minutes 18 seconds (14.3
mGy).

COMPLICATIONS:
None

PROCEDURE:
Informed written consent was obtained from the patient after a
thorough discussion of the procedural risks, benefits and
alternatives. All questions were addressed.

The indwelling catheter was infused with contrast, observed under
fluoroscopy.

The catheter was then attached to gravity drainage. Dressing was
changed.

Patient tolerated the procedure well and remained hemodynamically
stable throughout.

No complications were encountered and no significant blood loss.
FINDINGS: Infusion of the catheter demonstrates the pigtail catheter within
the lumen of the gallbladder. Contrast fills the cystic duct, common
hepatic duct, common bile duct with contrast traversing the ampulla
into the duodenum.

Small filling defects within the lumen of the gallbladder compatible
with stones/sludge/debris.
IMPRESSION: Cholangiogram through the indwelling percutaneous cholecystostomy
demonstrates adequate position of the catheter, and patency of
cystic duct and common bile duct.

## 2017-09-08 LAB — HM DIABETES FOOT EXAM

## 2017-09-08 LAB — HM DIABETES EYE EXAM

## 2017-09-11 ENCOUNTER — Other Ambulatory Visit: Payer: Medicare Other

## 2017-09-12 ENCOUNTER — Ambulatory Visit (INDEPENDENT_AMBULATORY_CARE_PROVIDER_SITE_OTHER): Payer: Medicare Other

## 2017-09-12 ENCOUNTER — Encounter: Payer: Self-pay | Admitting: Family Medicine

## 2017-09-12 ENCOUNTER — Other Ambulatory Visit: Payer: Medicare Other

## 2017-09-12 ENCOUNTER — Ambulatory Visit (INDEPENDENT_AMBULATORY_CARE_PROVIDER_SITE_OTHER): Payer: Medicare Other | Admitting: Family Medicine

## 2017-09-12 VITALS — BP 110/60 | HR 87 | Temp 98.1°F | Resp 16 | Wt 285.0 lb

## 2017-09-12 DIAGNOSIS — R635 Abnormal weight gain: Secondary | ICD-10-CM

## 2017-09-12 DIAGNOSIS — M7989 Other specified soft tissue disorders: Secondary | ICD-10-CM | POA: Diagnosis not present

## 2017-09-12 DIAGNOSIS — R600 Localized edema: Secondary | ICD-10-CM

## 2017-09-12 LAB — CBC WITH DIFFERENTIAL/PLATELET
BASOS ABS: 0.1 10*3/uL (ref 0.0–0.1)
Basophils Relative: 0.4 % (ref 0.0–3.0)
EOS ABS: 0.7 10*3/uL (ref 0.0–0.7)
EOS PCT: 6.1 % — AB (ref 0.0–5.0)
HCT: 41.6 % (ref 39.0–52.0)
HEMOGLOBIN: 13.3 g/dL (ref 13.0–17.0)
LYMPHS ABS: 2.9 10*3/uL (ref 0.7–4.0)
Lymphocytes Relative: 23.7 % (ref 12.0–46.0)
MCHC: 32 g/dL (ref 30.0–36.0)
MCV: 89.4 fl (ref 78.0–100.0)
MONO ABS: 1.4 10*3/uL — AB (ref 0.1–1.0)
Monocytes Relative: 11.1 % (ref 3.0–12.0)
NEUTROS PCT: 58.7 % (ref 43.0–77.0)
Neutro Abs: 7.2 10*3/uL (ref 1.4–7.7)
Platelets: 284 10*3/uL (ref 150.0–400.0)
RBC: 4.65 Mil/uL (ref 4.22–5.81)
RDW: 13.3 % (ref 11.5–15.5)
WBC: 12.3 10*3/uL — AB (ref 4.0–10.5)

## 2017-09-12 LAB — HEPATIC FUNCTION PANEL
ALBUMIN: 3.8 g/dL (ref 3.5–5.2)
ALT: 11 U/L (ref 0–53)
AST: 11 U/L (ref 0–37)
Alkaline Phosphatase: 74 U/L (ref 39–117)
BILIRUBIN TOTAL: 0.5 mg/dL (ref 0.2–1.2)
Bilirubin, Direct: 0.1 mg/dL (ref 0.0–0.3)
TOTAL PROTEIN: 6.6 g/dL (ref 6.0–8.3)

## 2017-09-12 LAB — BRAIN NATRIURETIC PEPTIDE: PRO B NATRI PEPTIDE: 74 pg/mL (ref 0.0–100.0)

## 2017-09-12 LAB — BASIC METABOLIC PANEL
BUN: 44 mg/dL — ABNORMAL HIGH (ref 6–23)
CALCIUM: 9.3 mg/dL (ref 8.4–10.5)
CHLORIDE: 103 meq/L (ref 96–112)
CO2: 31 meq/L (ref 19–32)
Creatinine, Ser: 1.27 mg/dL (ref 0.40–1.50)
GFR: 57.51 mL/min — ABNORMAL LOW (ref >60.00)
Glucose, Bld: 121 mg/dL — ABNORMAL HIGH (ref 70–99)
POTASSIUM: 4.9 meq/L (ref 3.5–5.1)
SODIUM: 141 meq/L (ref 135–145)

## 2017-09-12 LAB — TSH: TSH: 1.74 u[IU]/mL (ref 0.35–4.50)

## 2017-09-12 MED ORDER — ACCU-CHEK AVIVA PLUS W/DEVICE KIT: PACK | 0 refills | Status: DC

## 2017-09-12 MED ORDER — TORSEMIDE 20 MG PO TABS: 20.0000 mg | ORAL_TABLET | Freq: Two times a day (BID) | ORAL | 3 refills | Status: DC

## 2017-09-12 MED ORDER — GLUCOSE BLOOD VI STRP: ORAL_STRIP | 3 refills | Status: DC

## 2017-09-12 NOTE — Progress Notes (Signed)
   Subjective:    Patient ID: [name], male    DOB: 06/06/1932, 84 y.o.   MRN: [id number]  HPI Leg swelling- pt has gained 40 lbs in a month.  Pt reports he has had pain and swelling for almost a month.  Taking 80mg  Lasix qAM and 40mg  every afternoon.  Pt reports good water intake.  No CP, SOB.  Pt has hx of PE but origin of clot was unknown.  Pt remains on Xarelto.  Pt reports legs 'stay stiff'.  Pt reports feeling 'bloated'.  Wife denies changes to diet over the last month- pt reports decreased appetite.  No added salt.  Pt is able to lie in bed to sleep- doesn't have to be upright.   Review of Systems For ROS see HPI     Objective:   Physical Exam  Constitutional: He is oriented to person, place, and time. He appears well-developed and well-nourished. No distress.  Morbidly obese sitting in power chair  HENT:  Head: Normocephalic and atraumatic.  Eyes: Conjunctivae and EOM are normal. Pupils are equal, round, and reactive to light.  Neck: Normal range of motion. Neck supple. No thyromegaly present.  Cardiovascular: Normal rate, regular rhythm, normal heart sounds and intact distal pulses.  No murmur heard. Pulmonary/Chest: Effort normal and breath sounds normal. No respiratory distress. He has no wheezes. He has no rales.  Abdominal: Soft. Bowel sounds are normal. He exhibits no distension. There is no tenderness. There is no rebound and no guarding.  Obese abdomen w/o obvious ascites  Musculoskeletal: He exhibits edema (1-2+ pitting edema of LEs bilaterally).  Lymphadenopathy:    He has no cervical adenopathy.  Neurological: He is alert and oriented to person, place, and time. No cranial nerve deficit.  Skin: Skin is warm and dry.  Psychiatric: He has a normal mood and affect. His behavior is normal.  Vitals reviewed.         Assessment & Plan:  Leg swelling and weight gain- pt's vitals are completely normal and he is in no distress despite a 40lb weight gaine in the  last month.  Leg swelling is present but not any more impressive than last visit- which doesn't explain the weight gain.  No TTP over either calf.  He reports the 120mg  of Lasix daily isn't improving his swelling and his legs both feel 'tight'.  He has no other signs of CHF- no cough, wheeze, SOB and most recent ECHO from April 2018 showed EF of 60% and normal diastolic fxn.  The degree of weight gain in such a short time is extremely concerning but pt is asymptomatic.  At this time, will hold off on sending him to the ER due to high volume and risk of flu exposure.  Will get CXR, ECHO, BNP, labs and switch Lasix to Torsemide.  Wife reports that pt is not eating more- and if anything, may be eating less.  This is also concerning.  Discussed situation and possible red flags with both pt and wife w/ strict instructions to go to ER if SOB or CP.  Will follow closely.

## 2017-09-12 NOTE — Patient Instructions (Signed)
Follow up with me in 1 week to recheck swelling and weight Go to the [place] [facility] office to get your chest xray We'll call you with your ECHO appt We'll notify you of your lab results and make any changes STOP the Lasix START the Torsemide twice daily for the swelling If you have any shortness of breath, chest pain, or other concerns- please go to ER immediately! Hang in there!

## 2017-09-14 ENCOUNTER — Emergency Department (HOSPITAL_COMMUNITY): Payer: Medicare Other

## 2017-09-14 ENCOUNTER — Encounter (HOSPITAL_COMMUNITY): Payer: Self-pay | Admitting: Emergency Medicine

## 2017-09-14 ENCOUNTER — Inpatient Hospital Stay (HOSPITAL_COMMUNITY)
Admission: EM | Admit: 2017-09-14 | Discharge: 2017-09-16 | DRG: 684 | Disposition: A | Discharge status: Home / Self Care | Payer: Medicare Other | Attending: Internal Medicine | Admitting: Internal Medicine

## 2017-09-14 ENCOUNTER — Ambulatory Visit: Payer: Self-pay | Admitting: *Deleted

## 2017-09-14 DIAGNOSIS — M1990 Unspecified osteoarthritis, unspecified site: Secondary | ICD-10-CM | POA: Diagnosis present

## 2017-09-14 DIAGNOSIS — M549 Dorsalgia, unspecified: Secondary | ICD-10-CM | POA: Diagnosis present

## 2017-09-14 DIAGNOSIS — Z794 Long term (current) use of insulin: Secondary | ICD-10-CM

## 2017-09-14 DIAGNOSIS — N401 Enlarged prostate with lower urinary tract symptoms: Secondary | ICD-10-CM | POA: Diagnosis present

## 2017-09-14 DIAGNOSIS — N179 Acute kidney failure, unspecified: Secondary | ICD-10-CM | POA: Diagnosis not present

## 2017-09-14 DIAGNOSIS — F4310 Post-traumatic stress disorder, unspecified: Secondary | ICD-10-CM | POA: Diagnosis present

## 2017-09-14 DIAGNOSIS — Z86718 Personal history of other venous thrombosis and embolism: Secondary | ICD-10-CM

## 2017-09-14 DIAGNOSIS — I10 Essential (primary) hypertension: Secondary | ICD-10-CM | POA: Diagnosis present

## 2017-09-14 DIAGNOSIS — Z9049 Acquired absence of other specified parts of digestive tract: Secondary | ICD-10-CM

## 2017-09-14 DIAGNOSIS — T402X5A Adverse effect of other opioids, initial encounter: Secondary | ICD-10-CM | POA: Diagnosis present

## 2017-09-14 DIAGNOSIS — G894 Chronic pain syndrome: Secondary | ICD-10-CM | POA: Diagnosis not present

## 2017-09-14 DIAGNOSIS — R339 Retention of urine, unspecified: Secondary | ICD-10-CM | POA: Diagnosis not present

## 2017-09-14 DIAGNOSIS — K219 Gastro-esophageal reflux disease without esophagitis: Secondary | ICD-10-CM | POA: Diagnosis not present

## 2017-09-14 DIAGNOSIS — R338 Other retention of urine: Secondary | ICD-10-CM | POA: Diagnosis not present

## 2017-09-14 DIAGNOSIS — Z974 Presence of external hearing-aid: Secondary | ICD-10-CM

## 2017-09-14 DIAGNOSIS — G4730 Sleep apnea, unspecified: Secondary | ICD-10-CM | POA: Diagnosis present

## 2017-09-14 DIAGNOSIS — R0602 Shortness of breath: Secondary | ICD-10-CM | POA: Diagnosis not present

## 2017-09-14 DIAGNOSIS — E119 Type 2 diabetes mellitus without complications: Secondary | ICD-10-CM | POA: Diagnosis not present

## 2017-09-14 DIAGNOSIS — R079 Chest pain, unspecified: Secondary | ICD-10-CM | POA: Diagnosis not present

## 2017-09-14 DIAGNOSIS — E7800 Pure hypercholesterolemia, unspecified: Secondary | ICD-10-CM | POA: Diagnosis not present

## 2017-09-14 DIAGNOSIS — E785 Hyperlipidemia, unspecified: Secondary | ICD-10-CM | POA: Diagnosis present

## 2017-09-14 DIAGNOSIS — E1121 Type 2 diabetes mellitus with diabetic nephropathy: Secondary | ICD-10-CM | POA: Diagnosis present

## 2017-09-14 DIAGNOSIS — E860 Dehydration: Secondary | ICD-10-CM | POA: Diagnosis present

## 2017-09-14 DIAGNOSIS — K5903 Drug induced constipation: Secondary | ICD-10-CM | POA: Diagnosis present

## 2017-09-14 DIAGNOSIS — R0600 Dyspnea, unspecified: Secondary | ICD-10-CM | POA: Diagnosis not present

## 2017-09-14 DIAGNOSIS — Z993 Dependence on wheelchair: Secondary | ICD-10-CM

## 2017-09-14 DIAGNOSIS — Z86711 Personal history of pulmonary embolism: Secondary | ICD-10-CM

## 2017-09-14 DIAGNOSIS — Z885 Allergy status to narcotic agent status: Secondary | ICD-10-CM

## 2017-09-14 DIAGNOSIS — R34 Anuria and oliguria: Secondary | ICD-10-CM | POA: Diagnosis present

## 2017-09-14 LAB — URINALYSIS, ROUTINE W REFLEX MICROSCOPIC
Bilirubin Urine: NEGATIVE
Glucose, UA: NEGATIVE mg/dL
HGB URINE DIPSTICK: NEGATIVE
Ketones, ur: NEGATIVE mg/dL
LEUKOCYTES UA: NEGATIVE
Nitrite: NEGATIVE
PROTEIN: NEGATIVE mg/dL
SPECIFIC GRAVITY, URINE: 1.01 (ref 1.005–1.030)
pH: 5 (ref 5.0–8.0)

## 2017-09-14 LAB — CBC WITH DIFFERENTIAL/PLATELET
Basophils Absolute: 0 10*3/uL (ref 0.0–0.1)
Basophils Relative: 0 %
Eosinophils Absolute: 0.7 10*3/uL (ref 0.0–0.7)
Eosinophils Relative: 4 %
HEMATOCRIT: 39.9 % (ref 39.0–52.0)
HEMOGLOBIN: 13.3 g/dL (ref 13.0–17.0)
LYMPHS PCT: 22 %
Lymphs Abs: 3.5 10*3/uL (ref 0.7–4.0)
MCH: 29.5 pg (ref 26.0–34.0)
MCHC: 33.3 g/dL (ref 30.0–36.0)
MCV: 88.5 fL (ref 78.0–100.0)
MONO ABS: 1.4 10*3/uL — AB (ref 0.1–1.0)
MONOS PCT: 9 %
NEUTROS ABS: 9.9 10*3/uL — AB (ref 1.7–7.7)
NEUTROS PCT: 65 %
Platelets: 266 10*3/uL (ref 150–400)
RBC: 4.51 MIL/uL (ref 4.22–5.81)
RDW: 13.3 % (ref 11.5–15.5)
WBC: 15.5 10*3/uL — ABNORMAL HIGH (ref 4.0–10.5)

## 2017-09-14 LAB — COMPREHENSIVE METABOLIC PANEL
ALBUMIN: 3.7 g/dL (ref 3.5–5.0)
ALT: 14 U/L — ABNORMAL LOW (ref 17–63)
ANION GAP: 11 (ref 5–15)
AST: 17 U/L (ref 15–41)
Alkaline Phosphatase: 79 U/L (ref 38–126)
BUN: 73 mg/dL — ABNORMAL HIGH (ref 6–20)
CO2: 25 mmol/L (ref 22–32)
Calcium: 9 mg/dL (ref 8.9–10.3)
Chloride: 99 mmol/L — ABNORMAL LOW (ref 101–111)
Creatinine, Ser: 2.07 mg/dL — ABNORMAL HIGH (ref 0.61–1.24)
GFR calc non Af Amer: 28 mL/min — ABNORMAL LOW (ref >60)
GFR, EST AFRICAN AMERICAN: 32 mL/min — AB (ref >60)
GLUCOSE: 168 mg/dL — AB (ref 65–99)
POTASSIUM: 4.3 mmol/L (ref 3.5–5.1)
SODIUM: 135 mmol/L (ref 135–145)
Total Bilirubin: 0.5 mg/dL (ref 0.3–1.2)
Total Protein: 7 g/dL (ref 6.5–8.1)

## 2017-09-14 LAB — I-STAT TROPONIN, ED: Troponin i, poc: 0 ng/mL (ref 0.00–0.08)

## 2017-09-14 LAB — BRAIN NATRIURETIC PEPTIDE: B Natriuretic Peptide: 26.7 pg/mL (ref 0.0–100.0)

## 2017-09-14 MED ORDER — ROSUVASTATIN CALCIUM 20 MG PO TABS
20.0000 mg | ORAL_TABLET | Freq: Every evening | ORAL | Status: DC
  Administered 2017-09-15: 20 mg via ORAL
  Filled 2017-09-14 (×3): qty 1

## 2017-09-14 MED ORDER — BOOST PO LIQD
237.0000 mL | Freq: Every day | ORAL | Status: DC | PRN
  Filled 2017-09-14: qty 237

## 2017-09-14 MED ORDER — TAMSULOSIN HCL 0.4 MG PO CAPS
0.4000 mg | ORAL_CAPSULE | Freq: Every day | ORAL | Status: DC
  Administered 2017-09-15: 0.4 mg via ORAL
  Filled 2017-09-14: qty 1

## 2017-09-14 MED ORDER — METHOCARBAMOL 500 MG PO TABS: 750.0000 mg | ORAL_TABLET | Freq: Four times a day (QID) | ORAL | Status: DC | PRN

## 2017-09-14 MED ORDER — FUROSEMIDE 10 MG/ML IJ SOLN: 40.0000 mg | Freq: Once | INTRAMUSCULAR | Status: DC

## 2017-09-14 MED ORDER — SODIUM CHLORIDE 0.9 % IV SOLN: INTRAVENOUS | Status: DC

## 2017-09-14 MED ORDER — HYDROMORPHONE HCL 4 MG PO TABS
2.0000 mg | ORAL_TABLET | Freq: Two times a day (BID) | ORAL | Status: DC | PRN
  Administered 2017-09-15: 2 mg via ORAL
  Filled 2017-09-14: qty 1

## 2017-09-14 MED ORDER — VITAMIN D 1000 UNITS PO TABS
2000.0000 [IU] | ORAL_TABLET | Freq: Two times a day (BID) | ORAL | Status: DC
  Administered 2017-09-15 – 2017-09-16 (×3): 2000 [IU] via ORAL
  Filled 2017-09-14 (×4): qty 2

## 2017-09-14 MED ORDER — INSULIN ASPART 100 UNIT/ML ~~LOC~~ SOLN
0.0000 [IU] | Freq: Three times a day (TID) | SUBCUTANEOUS | Status: DC
  Administered 2017-09-15 (×2): 1 [IU] via SUBCUTANEOUS
  Administered 2017-09-15: 2 [IU] via SUBCUTANEOUS
  Administered 2017-09-16: 1 [IU] via SUBCUTANEOUS
  Filled 2017-09-14: qty 1

## 2017-09-14 MED ORDER — DOCUSATE SODIUM 100 MG PO CAPS
100.0000 mg | ORAL_CAPSULE | Freq: Two times a day (BID) | ORAL | Status: DC
  Administered 2017-09-15 – 2017-09-16 (×3): 100 mg via ORAL
  Filled 2017-09-14 (×3): qty 1

## 2017-09-14 MED ORDER — TRAZODONE HCL 50 MG PO TABS
50.0000 mg | ORAL_TABLET | Freq: Every day | ORAL | Status: DC
  Administered 2017-09-15 (×2): 50 mg via ORAL
  Filled 2017-09-14 (×2): qty 1

## 2017-09-14 MED ORDER — INSULIN ASPART 100 UNIT/ML ~~LOC~~ SOLN: 0.0000 [IU] | Freq: Every day | SUBCUTANEOUS | Status: DC

## 2017-09-14 MED ORDER — ACETAMINOPHEN 325 MG PO TABS: 650.0000 mg | ORAL_TABLET | Freq: Four times a day (QID) | ORAL | Status: DC | PRN

## 2017-09-14 MED ORDER — POLYETHYLENE GLYCOL 3350 17 G PO PACK
17.0000 g | PACK | Freq: Every day | ORAL | Status: DC
  Administered 2017-09-15 – 2017-09-16 (×2): 17 g via ORAL
  Filled 2017-09-14 (×2): qty 1

## 2017-09-14 MED ORDER — HEPARIN SODIUM (PORCINE) 5000 UNIT/ML IJ SOLN
5000.0000 [IU] | Freq: Three times a day (TID) | INTRAMUSCULAR | Status: DC
  Administered 2017-09-15 – 2017-09-16 (×3): 5000 [IU] via SUBCUTANEOUS
  Filled 2017-09-14 (×6): qty 1

## 2017-09-14 MED ORDER — ONDANSETRON HCL 4 MG PO TABS: 4.0000 mg | ORAL_TABLET | Freq: Four times a day (QID) | ORAL | Status: DC | PRN

## 2017-09-14 MED ORDER — GALANTAMINE HYDROBROMIDE 4 MG PO TABS
8.0000 mg | ORAL_TABLET | Freq: Two times a day (BID) | ORAL | Status: DC
  Administered 2017-09-15 – 2017-09-16 (×3): 8 mg via ORAL
  Filled 2017-09-14 (×3): qty 2

## 2017-09-14 MED ORDER — QUETIAPINE FUMARATE 50 MG PO TABS
50.0000 mg | ORAL_TABLET | Freq: Every day | ORAL | Status: DC
  Administered 2017-09-15 (×2): 50 mg via ORAL
  Filled 2017-09-14 (×2): qty 1

## 2017-09-14 MED ORDER — MELATONIN 5 MG PO TABS: 5.0000 mg | ORAL_TABLET | Freq: Every day | ORAL | Status: DC

## 2017-09-14 MED ORDER — ONDANSETRON HCL 4 MG/2ML IJ SOLN: 4.0000 mg | Freq: Four times a day (QID) | INTRAMUSCULAR | Status: DC | PRN

## 2017-09-14 MED ORDER — VITAMIN B-12 1000 MCG PO TABS
500.0000 ug | ORAL_TABLET | Freq: Two times a day (BID) | ORAL | Status: DC
Start: 2017-09-15 — End: 2017-09-16
  Administered 2017-09-15 – 2017-09-16 (×3): 500 ug via ORAL
  Filled 2017-09-14 (×4): qty 1

## 2017-09-14 MED ORDER — CARVEDILOL 3.125 MG PO TABS
3.1250 mg | ORAL_TABLET | Freq: Two times a day (BID) | ORAL | Status: DC
  Administered 2017-09-15 – 2017-09-16 (×3): 3.125 mg via ORAL
  Filled 2017-09-14 (×4): qty 1

## 2017-09-14 MED ORDER — ACETAMINOPHEN 650 MG RE SUPP: 650.0000 mg | Freq: Four times a day (QID) | RECTAL | Status: DC | PRN

## 2017-09-14 MED ORDER — PANTOPRAZOLE SODIUM 40 MG PO TBEC
40.0000 mg | DELAYED_RELEASE_TABLET | Freq: Every day | ORAL | Status: DC
  Administered 2017-09-15 – 2017-09-16 (×2): 40 mg via ORAL
  Filled 2017-09-14 (×2): qty 1

## 2017-09-14 MED ORDER — INSULIN GLARGINE 100 UNIT/ML ~~LOC~~ SOLN
15.0000 [IU] | Freq: Every day | SUBCUTANEOUS | Status: DC
  Administered 2017-09-15: 15 [IU] via SUBCUTANEOUS
  Filled 2017-09-14 (×3): qty 0.15
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
## 2017-09-14 NOTE — ED Triage Notes (Signed)
Patient sent from PCP reports forty pound weight gain in one month and bilateral lower extremity swelling. Denies SOB and CP. Speaking in full sentences without difficulty.

## 2017-09-14 NOTE — Telephone Encounter (Signed)
Agree w/ pt going to ER

## 2017-09-14 NOTE — Telephone Encounter (Signed)
Patient has arrived at Emergency Room.  Closing encounter - PCP will receive notes from ER visit.
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
## 2017-09-14 NOTE — Telephone Encounter (Signed)
Called in c/o difficulty urinating.   He has drank about a liter of water today but has only urinated an ounce at the time X4 today.   He did mention he saw Dr. Birdie Riddle on Tues because he had gained 40 lbs in 30 days.   His fluid pill was changed.   He is taking it as prescribed every morning and evening.   He just took his evening dose at 1:00PM. He denies pain with urination or any discomfort.   "I just feel really bloated"  I called Dr. Virgil Benedict flow coordinator and made them aware of the situation before referring him to the ED per the protocol.    They agreed that he should go to the ED.  I made the pt aware of this and he said he is going to take a shower and then he and his wife will go to [facility] ED in about 30 min.  I routed a note to Dr. Virgil Benedict nurse pool. Reason for Disposition . [1] Unable to urinate (or only a few drops) > 4 hours AND     [2] bladder feels very full (e.g., feels blocked with strong urge to urinate; palpable bladder)  Answer Assessment - Initial Assessment Questions 1. SEVERITY: "How bad is the pain?"  (e.g., Scale 1-10; mild, moderate, or severe)   - MILD (1-3): complains slightly about urination hurting   - MODERATE (4-7): interferes with normal activities     - SEVERE (8-10): excruciating, unwilling or unable to urinate because of the pain      Not in pain but feel lousy.   I feel weak. 2. FREQUENCY: "How many times have you had painful urination today?"      Urinated 3 times today.  Maybe an ounce at a time.   I've drunk about a liter of water today.   I take a fluid pill every morning and took my evening dose at 1:00PM. 3. PATTERN: "Is pain present every time you urinate or just sometimes?"      No burning. 4. ONSET: "When did the painful urination start?"      Since Wed. Morning I notice I'm drinking in more than I peeing out. 5. FEVER: "Do you have a fever?" If so, ask: "What is your temperature, how was it measured, and when did it start?"   "I just feel lousy"    6. PAST UTI: "Have you had a urine infection before?" If so, ask: "When was the last time?" and "What happened that time?"      No.    7. CAUSE: "What do you think is causing the painful urination?"      I don't know 8. OTHER SYMPTOMS: "Do you have any other symptoms?" (e.g., flank pain, penile discharge, scrotal pain, blood in urine)     I feel normal except I'm bloated.  Protocols used: URINATION PAIN - MALE-A-AH

## 2017-09-15 ENCOUNTER — Other Ambulatory Visit: Payer: Self-pay

## 2017-09-15 ENCOUNTER — Observation Stay (HOSPITAL_COMMUNITY)
Admit: 2017-09-15 | Discharge: 2017-09-15 | Disposition: A | Discharge status: Home / Self Care | Payer: Medicare Other | Attending: Internal Medicine | Admitting: Internal Medicine

## 2017-09-15 ENCOUNTER — Observation Stay (HOSPITAL_COMMUNITY): Payer: Medicare Other

## 2017-09-15 DIAGNOSIS — M1990 Unspecified osteoarthritis, unspecified site: Secondary | ICD-10-CM | POA: Diagnosis present

## 2017-09-15 DIAGNOSIS — G4730 Sleep apnea, unspecified: Secondary | ICD-10-CM | POA: Diagnosis present

## 2017-09-15 DIAGNOSIS — N401 Enlarged prostate with lower urinary tract symptoms: Secondary | ICD-10-CM | POA: Diagnosis present

## 2017-09-15 DIAGNOSIS — Z993 Dependence on wheelchair: Secondary | ICD-10-CM | POA: Diagnosis not present

## 2017-09-15 DIAGNOSIS — R338 Other retention of urine: Secondary | ICD-10-CM | POA: Diagnosis not present

## 2017-09-15 DIAGNOSIS — Z86718 Personal history of other venous thrombosis and embolism: Secondary | ICD-10-CM | POA: Diagnosis not present

## 2017-09-15 DIAGNOSIS — E7800 Pure hypercholesterolemia, unspecified: Secondary | ICD-10-CM | POA: Diagnosis present

## 2017-09-15 DIAGNOSIS — Z9049 Acquired absence of other specified parts of digestive tract: Secondary | ICD-10-CM | POA: Diagnosis not present

## 2017-09-15 DIAGNOSIS — K219 Gastro-esophageal reflux disease without esophagitis: Secondary | ICD-10-CM | POA: Diagnosis present

## 2017-09-15 DIAGNOSIS — I10 Essential (primary) hypertension: Secondary | ICD-10-CM

## 2017-09-15 DIAGNOSIS — Z974 Presence of external hearing-aid: Secondary | ICD-10-CM | POA: Diagnosis not present

## 2017-09-15 DIAGNOSIS — R0600 Dyspnea, unspecified: Secondary | ICD-10-CM | POA: Diagnosis not present

## 2017-09-15 DIAGNOSIS — Z794 Long term (current) use of insulin: Secondary | ICD-10-CM | POA: Diagnosis not present

## 2017-09-15 DIAGNOSIS — Z86711 Personal history of pulmonary embolism: Secondary | ICD-10-CM | POA: Diagnosis not present

## 2017-09-15 DIAGNOSIS — M549 Dorsalgia, unspecified: Secondary | ICD-10-CM | POA: Diagnosis present

## 2017-09-15 DIAGNOSIS — R609 Edema, unspecified: Secondary | ICD-10-CM | POA: Diagnosis not present

## 2017-09-15 DIAGNOSIS — R339 Retention of urine, unspecified: Secondary | ICD-10-CM | POA: Diagnosis not present

## 2017-09-15 DIAGNOSIS — E860 Dehydration: Secondary | ICD-10-CM | POA: Diagnosis present

## 2017-09-15 DIAGNOSIS — R0602 Shortness of breath: Secondary | ICD-10-CM | POA: Diagnosis not present

## 2017-09-15 DIAGNOSIS — M7989 Other specified soft tissue disorders: Secondary | ICD-10-CM

## 2017-09-15 DIAGNOSIS — G894 Chronic pain syndrome: Secondary | ICD-10-CM | POA: Diagnosis present

## 2017-09-15 DIAGNOSIS — R34 Anuria and oliguria: Secondary | ICD-10-CM | POA: Diagnosis present

## 2017-09-15 DIAGNOSIS — Z885 Allergy status to narcotic agent status: Secondary | ICD-10-CM | POA: Diagnosis not present

## 2017-09-15 DIAGNOSIS — K5903 Drug induced constipation: Secondary | ICD-10-CM | POA: Diagnosis present

## 2017-09-15 DIAGNOSIS — N179 Acute kidney failure, unspecified: Secondary | ICD-10-CM | POA: Diagnosis not present

## 2017-09-15 DIAGNOSIS — F4310 Post-traumatic stress disorder, unspecified: Secondary | ICD-10-CM | POA: Diagnosis present

## 2017-09-15 DIAGNOSIS — T402X5A Adverse effect of other opioids, initial encounter: Secondary | ICD-10-CM | POA: Diagnosis present

## 2017-09-15 DIAGNOSIS — E785 Hyperlipidemia, unspecified: Secondary | ICD-10-CM | POA: Diagnosis present

## 2017-09-15 DIAGNOSIS — E1121 Type 2 diabetes mellitus with diabetic nephropathy: Secondary | ICD-10-CM | POA: Diagnosis present

## 2017-09-15 LAB — COMPREHENSIVE METABOLIC PANEL
ALT: 14 U/L — ABNORMAL LOW (ref 17–63)
ANION GAP: 9 (ref 5–15)
AST: 16 U/L (ref 15–41)
Albumin: 3.5 g/dL (ref 3.5–5.0)
Alkaline Phosphatase: 76 U/L (ref 38–126)
BUN: 73 mg/dL — ABNORMAL HIGH (ref 6–20)
CALCIUM: 9 mg/dL (ref 8.9–10.3)
CHLORIDE: 101 mmol/L (ref 101–111)
CO2: 29 mmol/L (ref 22–32)
Creatinine, Ser: 1.94 mg/dL — ABNORMAL HIGH (ref 0.61–1.24)
GFR calc Af Amer: 35 mL/min — ABNORMAL LOW (ref >60)
GFR, EST NON AFRICAN AMERICAN: 30 mL/min — AB (ref >60)
Glucose, Bld: 134 mg/dL — ABNORMAL HIGH (ref 65–99)
Potassium: 3.9 mmol/L (ref 3.5–5.1)
SODIUM: 139 mmol/L (ref 135–145)
Total Bilirubin: 0.7 mg/dL (ref 0.3–1.2)
Total Protein: 7.1 g/dL (ref 6.5–8.1)

## 2017-09-15 LAB — GLUCOSE, CAPILLARY
GLUCOSE-CAPILLARY: 186 mg/dL — AB (ref 65–99)
GLUCOSE-CAPILLARY: 156 mg/dL — AB (ref 65–99)

## 2017-09-15 LAB — NA AND K (SODIUM & POTASSIUM), RAND UR
Potassium Urine: 39 mmol/L
SODIUM UR: 63 mmol/L

## 2017-09-15 LAB — OSMOLALITY, URINE: OSMOLALITY UR: 324 mosm/kg (ref 300–900)

## 2017-09-15 LAB — CBG MONITORING, ED
GLUCOSE-CAPILLARY: 138 mg/dL — AB (ref 65–99)
Glucose-Capillary: 122 mg/dL — ABNORMAL HIGH (ref 65–99)

## 2017-09-15 MED ORDER — SODIUM CHLORIDE 0.9 % IV SOLN
INTRAVENOUS | Status: DC
  Administered 2017-09-15: 21:00:00 via INTRAVENOUS

## 2017-09-15 NOTE — Progress Notes (Signed)
Patient ID: [name], male   DOB: 1932-04-01, 84 y.o.   MRN: [id number]  PROGRESS NOTE    [name]  SEG:[id number] DOB: 04-25-1932 DOA: 09/14/2017 PCP: [name], MD   Brief Narrative:  84 year old male with history of diabetes mellitus type 2, essential hypertension, GERD, BPH, chronic pain was sent to the ER by his PCP for evaluation of urinary retention/oliguria.  Patient was admitted with acute kidney injury and urinary retention.  Foley catheter was placed in the emergency room.   Assessment & Plan:   Active Problems:   AKI (acute kidney injury) ([place])    Acute kidney injury with oligouria -Status post Foley's catheter placement in the ED which is currently draining urine -Renal ultrasound -Patient might need outpatient urology evaluation -Creatinine improving.  Continue IV fluids  Bilateral lower extremity swelling with recent weight gain - Secondary to fluid retention over the last couple of weeks.   -Echo ordered is pending -Bilateral lower extremity duplex ultrasound to rule out DVT   diabetes mellitus type 2 -Accu-Cheks with coverage along with Lantus.  History of BPH -Continue Flomax.  Need outpatient urology evaluation  GERD -Continue PPI  Essential hypertension -Continue Coreg.  Monitor blood pressure  Chronic pain -On home Dilaudid, Robaxin.  Bowel regimen as needed.  Outpatient follow-up  Hx of PE/DVT- no longer on Xarelto.    DVT prophylaxis: We will start heparin Code Status: Full Family Communication: Wife at bedside  disposition Plan: Home in 1-2 days  Consultants: None  Procedures: None  Antimicrobials: None   Subjective: Patient seen and examined at bedside.  He states that his leg swelling is slightly better.  Denies any current fever, nausea, vomiting, shortness of breath.  Objective: Vitals:   09/15/17 0325 09/15/17 0637 09/15/17 0800 09/15/17 0841  BP: 128/64 118/64 (!) 97/51 (!) 103/91  Pulse: 68 76  71 74  Resp: 11 17 10    Temp:      TempSrc:      SpO2: (!) 89% 90% 94%     Intake/Output Summary (Last 24 hours) at 09/15/2017 0912 Last data filed at 09/15/2017 0734 Gross per 24 hour  Intake 500 ml  Output 2200 ml  Net -1700 ml   There were no vitals filed for this visit.  Examination:  General exam: Appears calm and comfortable  Respiratory system: Bilateral decreased breath sound at bases with some basilar crackles Cardiovascular system: S1 & S2 heard, rate controlled.  Gastrointestinal system: Abdomen is slightly distended, soft and nontender. Normal bowel sounds heard. Extremities: No cyanosis, clubbing; 1+ edema   Data Reviewed: I have personally reviewed following labs and imaging studies  CBC: Recent Labs  Lab 09/12/17 0946 09/14/17 2110  WBC 12.3* 15.5*  NEUTROABS 7.2 9.9*  HGB 13.3 13.3  HCT 41.6 39.9  MCV 89.4 88.5  PLT 284.0 737   Basic Metabolic Panel: Recent Labs  Lab 09/12/17 0946 09/14/17 2110 09/15/17 0650  NA 141 135 139  K 4.9 4.3 3.9  CL 103 99* 101  CO2 31 25 29   GLUCOSE 121* 168* 134*  BUN 44* 73* 73*  CREATININE 1.27 2.07* 1.94*  CALCIUM 9.3 9.0 9.0   GFR: Estimated Creatinine Clearance: 39.5 mL/min (A) (by C-G formula based on SCr of 1.94 mg/dL (H)). Liver Function Tests: Recent Labs  Lab 09/12/17 0946 09/14/17 2110 09/15/17 0650  AST 11 17 16   ALT 11 14* 14*  ALKPHOS 74 79 76  BILITOT 0.5 0.5 0.7  PROT 6.6 7.0  7.1  ALBUMIN 3.8 3.7 3.5   No results for input(s): LIPASE, AMYLASE in the last 168 hours. No results for input(s): AMMONIA in the last 168 hours. Coagulation Profile: No results for input(s): INR, PROTIME in the last 168 hours. Cardiac Enzymes: No results for input(s): CKTOTAL, CKMB, CKMBINDEX, TROPONINI in the last 168 hours. BNP (last 3 results) Recent Labs    09/12/17 0946  PROBNP 74.0   HbA1C: No results for input(s): HGBA1C in the last 72 hours. CBG: Recent Labs  Lab 09/15/17 0731  GLUCAP 122*     Lipid Profile: No results for input(s): CHOL, HDL, LDLCALC, TRIG, CHOLHDL, LDLDIRECT in the last 72 hours. Thyroid Function Tests: Recent Labs    09/12/17 0946  TSH 1.74   Anemia Panel: No results for input(s): VITAMINB12, FOLATE, FERRITIN, TIBC, IRON, RETICCTPCT in the last 72 hours. Sepsis Labs: No results for input(s): PROCALCITON, LATICACIDVEN in the last 168 hours.  No results found for this or any previous visit (from the past 240 hour(s)).       Radiology Studies: Dg Chest 2 View  Result Date: 09/14/2017 CLINICAL DATA:  84 year old male with chest pain. EXAM: CHEST  2 VIEW COMPARISON:  Chest radiograph dated 09/12/2017 FINDINGS: The heart size and mediastinal contours are within normal limits. Both lungs are clear. The visualized skeletal structures are unremarkable. IMPRESSION: No active cardiopulmonary disease. Electronically Signed   By: Anner Crete M.D.   On: 09/14/2017 22:12   US Renal  Result Date: 09/15/2017 CLINICAL DATA:  Patient with acute renal injury. EXAM: RENAL / URINARY TRACT ULTRASOUND COMPLETE COMPARISON:  CT abdomen pelvis 12/17/2016. FINDINGS: Right Kidney: Length: 12.5 cm. Echogenicity within normal limits. No mass or hydronephrosis visualized. Left Kidney: Length: 12.2 cm. Echogenicity within normal limits. No mass or hydronephrosis visualized. There is a 1.8 cm cyst. Bladder: Decompressed with Foley catheter. IMPRESSION: No hydronephrosis. Electronically Signed   By: Lovey Newcomer M.D.   On: 09/15/2017 08:52        Scheduled Meds: . carvedilol  3.125 mg Oral BID WC  . cholecalciferol  2,000 Units Oral BID  . cyanocobalamin  500 mcg Oral BID  . docusate sodium  100 mg Oral BID  . galantamine  8 mg Oral BID AC & HS  . heparin  5,000 Units Subcutaneous Q8H  . insulin aspart  0-5 Units Subcutaneous QHS  . insulin aspart  0-9 Units Subcutaneous TID WC  . insulin glargine  15 Units Subcutaneous QHS  . pantoprazole  40 mg Oral Daily  .  polyethylene glycol  17-25.5 g Oral Daily  . QUEtiapine  50 mg Oral QHS  . rosuvastatin  20 mg Oral QPM  . tamsulosin  0.4 mg Oral QPC supper  . traZODone  50 mg Oral QHS   Continuous Infusions: . sodium chloride    . sodium chloride       LOS: 0 days        Aline August, MD [facility] Pager [phone]  If 7PM-7AM, please contact night-coverage [email or web address] Password [email or web address] 09/15/2017, 9:12 AM

## 2017-09-15 NOTE — Progress Notes (Signed)
*  Preliminary Results* Bilateral lower extremity venous duplex completed. Bilateral lower extremities are negative for deep vein thrombosis. There is no evidence of Baker's cyst bilaterally.  09/15/2017 11:09 AM Maudry Mayhew, BS, RVT, RDCS, RDMS

## 2017-09-15 NOTE — ED Notes (Signed)
Meal tray provided for patient

## 2017-09-16 ENCOUNTER — Inpatient Hospital Stay (HOSPITAL_COMMUNITY): Payer: Medicare Other

## 2017-09-16 DIAGNOSIS — R0600 Dyspnea, unspecified: Secondary | ICD-10-CM

## 2017-09-16 DIAGNOSIS — M7989 Other specified soft tissue disorders: Secondary | ICD-10-CM

## 2017-09-16 DIAGNOSIS — R339 Retention of urine, unspecified: Secondary | ICD-10-CM

## 2017-09-16 LAB — CBC WITH DIFFERENTIAL/PLATELET
BASOS ABS: 0 10*3/uL (ref 0.0–0.1)
BASOS PCT: 0 %
EOS ABS: 0.7 10*3/uL (ref 0.0–0.7)
Eosinophils Relative: 7 %
HCT: 39.8 % (ref 39.0–52.0)
Hemoglobin: 12.9 g/dL — ABNORMAL LOW (ref 13.0–17.0)
LYMPHS PCT: 34 %
Lymphs Abs: 3.5 10*3/uL (ref 0.7–4.0)
MCH: 29 pg (ref 26.0–34.0)
MCHC: 32.4 g/dL (ref 30.0–36.0)
MCV: 89.4 fL (ref 78.0–100.0)
Monocytes Absolute: 1.1 10*3/uL — ABNORMAL HIGH (ref 0.1–1.0)
Monocytes Relative: 11 %
Neutro Abs: 4.9 10*3/uL (ref 1.7–7.7)
Neutrophils Relative %: 48 %
PLATELETS: 259 10*3/uL (ref 150–400)
RBC: 4.45 MIL/uL (ref 4.22–5.81)
RDW: 13.1 % (ref 11.5–15.5)
WBC: 10.1 10*3/uL (ref 4.0–10.5)

## 2017-09-16 LAB — BASIC METABOLIC PANEL
ANION GAP: 6 (ref 5–15)
BUN: 49 mg/dL — ABNORMAL HIGH (ref 6–20)
CO2: 30 mmol/L (ref 22–32)
Calcium: 9.1 mg/dL (ref 8.9–10.3)
Chloride: 104 mmol/L (ref 101–111)
Creatinine, Ser: 1.3 mg/dL — ABNORMAL HIGH (ref 0.61–1.24)
GFR, EST AFRICAN AMERICAN: 57 mL/min — AB (ref >60)
GFR, EST NON AFRICAN AMERICAN: 49 mL/min — AB (ref >60)
Glucose, Bld: 93 mg/dL (ref 65–99)
POTASSIUM: 4.9 mmol/L (ref 3.5–5.1)
SODIUM: 140 mmol/L (ref 135–145)

## 2017-09-16 LAB — ECHOCARDIOGRAM COMPLETE
Height: 71 in
Weight: 4733.72 oz

## 2017-09-16 LAB — OSMOLALITY: OSMOLALITY: 310 mosm/kg — AB (ref 275–295)

## 2017-09-16 LAB — HEMOGLOBIN A1C
HEMOGLOBIN A1C: 7.3 % — AB (ref 4.8–5.6)
MEAN PLASMA GLUCOSE: 162.81 mg/dL

## 2017-09-16 LAB — MAGNESIUM: Magnesium: 2.1 mg/dL (ref 1.7–2.4)

## 2017-09-16 LAB — GLUCOSE, CAPILLARY
GLUCOSE-CAPILLARY: 92 mg/dL (ref 65–99)
Glucose-Capillary: 149 mg/dL — ABNORMAL HIGH (ref 65–99)

## 2017-09-16 MED ORDER — DOCUSATE SODIUM 100 MG PO CAPS: 100.0000 mg | ORAL_CAPSULE | Freq: Two times a day (BID) | ORAL | 0 refills | Status: DC

## 2017-09-16 NOTE — Progress Notes (Signed)
ECHO arrived prior to discharge, completed exam. Pt discharged home with all personal belongings. Pt discharged with foley catheter in place per MD order. Education completed.

## 2017-09-16 NOTE — Progress Notes (Addendum)
ECHO team unavailable for weekend, MD paged. Pt to f/u with PCP to obtain ECHO.

## 2017-09-16 NOTE — Progress Notes (Signed)
  Echocardiogram 2D Echocardiogram has been performed.  Rand Boller T Yarenis Cerino 09/16/2017, 2:11 PM

## 2017-09-16 NOTE — Progress Notes (Signed)
ECHO team paged again to have procedure completed prior to discharge. Waiting for response.

## 2017-09-16 NOTE — Discharge Summary (Addendum)
Physician Discharge Summary  [name] ERX:[id number] DOB: [birth year] DOA: 09/14/2017  PCP: [name], MD  Admit date: 09/14/2017 Discharge date: 09/16/2017  Admitted From: Home Disposition: Home  Recommendations for Outpatient Follow-up:  1. Follow up with PCP in 1 week with repeat CBC/BMP 2. Patient will benefit from outpatient urology evaluation and follow-up 3. Patient will benefit from outpatient cardiology evaluation and follow-up 4. Stop torsemide until evaluation by primary care provider  Home Health: No Equipment/Devices: None  Discharge Condition: Stable  CODE STATUS: Full Diet recommendation: Heart Healthy   Brief/Interim Summary: 84 year old male with history of diabetes mellitus type 2, essential hypertension, GERD, BPH, chronic pain was sent to the ER by his PCP for evaluation of urinary retention/oliguria.  Patient was admitted with acute kidney injury and urinary retention.  Foley catheter was placed in the emergency room.  Patient has had negative balance of 5791.7 mL since admission.  He feels much better.  He will be discharged  today.  He needs to be evaluated by urology as an outpatient.   Acute kidney injury with  urinary retention/oligouria -Status post Foley's catheter placement in the ED  -Renal ultrasound is negative for hydronephrosis -Patient is having good urine output and has had negative balance of 5791.7 mL since admission -Outpatient evaluation and follow-up by urology.  Spoke to Dr. Eskridge/urology on phone and he agrees with the plan. -Discharge home with Foley catheter -Renal function has much improved. -Will keep torsemide on hold until evaluation by primary care provider.  Patient will benefit from a repeat BMP within a week  Bilateral lower extremity swelling with recent weight gain - Secondary to fluid retention over the last couple of weeks.  -Lower extremity swelling is improving. -Outpatient Echo.  Patient can be  discharged today and followed up by primary care provider.  Patient might benefit from outpatient cardiology evaluation. -Bilateral lower extremity duplex ultrasound was negative for DVT  diabetes mellitus type 2 -Continue home regimen.  Outpatient follow-up  History of BPH -Continue Flomax.  outpatient urology evaluation.  Continue Foley catheter  GERD -Continue PPI  Essential hypertension -Continue Coreg.   Hold torsemide  Chronic pain -On home Dilaudid, Robaxin. Outpatient follow-up  Hx of PE/DVT- no longer on Xarelto as per primary care provider.     Discharge Diagnoses:  Active Problems:   AKI (acute kidney injury) [facility])   Dyspnea    Discharge Instructions  Discharge Instructions    Ambulatory referral to Cardiology   Complete by:  As directed    Probable CHF; recent hospitalization for Urinary retention   Ambulatory referral to Urology   Complete by:  As directed    Hospitalization for Urinary retention and foley catheter placement   Call MD for:  difficulty breathing, headache or visual disturbances   Complete by:  As directed    Call MD for:  extreme fatigue   Complete by:  As directed    Call MD for:  hives   Complete by:  As directed    Call MD for:  persistant dizziness or light-headedness   Complete by:  As directed    Call MD for:  persistant nausea and vomiting   Complete by:  As directed    Call MD for:  severe uncontrolled pain   Complete by:  As directed    Call MD for:  temperature >100.4   Complete by:  As directed    Diet - low sodium heart healthy   Complete by:  As  directed    Increase activity slowly   Complete by:  As directed      Allergies as of 09/16/2017      Reactions   Oxycontin [oxycodone Hcl] Other (See Comments)   Pt states he cant sleep w/ this med; also makes him constipated.       Medication List    STOP taking these medications   potassium chloride SA 20 MEQ tablet Commonly known as:  K-DUR,KLOR-CON    torsemide 20 MG tablet Commonly known as:  DEMADEX   XARELTO 20 MG Tabs tablet Generic drug:  rivaroxaban     TAKE these medications   ACCU-CHEK AVIVA PLUS w/Device Kit Use glucometer to test sugars 4 times daily. Dx. E11.9   ACCU-CHEK FASTCLIX LANCET Kit Use one lancet each time sugars are tested. Pt checks sugars 4 times daily.   ACCU-CHEK FASTCLIX LANCETS Misc Use one lancet each time sugars are tested. Pt checks sugars 4 times daily.   carvedilol 3.125 MG tablet Commonly known as:  COREG TAKE 1 TABLET TWICE A DAY WITH MEALS   cyanocobalamin 500 MCG tablet Take 500 mcg by mouth 2 (two) times daily.   docusate sodium 100 MG capsule Commonly known as:  COLACE Take 1 capsule (100 mg total) by mouth 2 (two) times daily.   galantamine 16 MG 24 hr capsule Commonly known as:  RAZADYNE ER Take 16 mg by mouth daily with breakfast.   glucose blood test strip Commonly known as:  ACCU-CHEK AVIVA PLUS Use one strip to test sugars. Pt checks sugars 4 times daily. Dx. E11.9   HUMALOG 100 UNIT/ML injection Generic drug:  insulin lispro Inject 0-5 Units into the skin 3 (three) times daily before meals. 0-200 = 0 units, 201-250 = 2 units, 251-300 = 3 units, 301-350 = 4 units, 351-400 = 5 units, >401 call MD/NP   HYDROmorphone 2 MG tablet Commonly known as:  DILAUDID Take 1 tablet (2 mg total) by mouth 2 (two) times daily as needed for severe pain.   insulin glargine 100 UNIT/ML injection Commonly known as:  LANTUS Inject 26 Units into the skin at bedtime.   lactose free nutrition Liqd Take 237 mLs by mouth daily as needed (nutrition).   magnesium oxide 400 MG tablet Commonly known as:  MAG-OX Take 400 mg by mouth 2 (two) times daily.   Melatonin 5 MG Tabs Take 5 mg by mouth at bedtime.   methocarbamol 750 MG tablet Commonly known as:  ROBAXIN Take 1 tablet (750 mg total) by mouth every 6 (six) hours as needed for muscle spasms.   pantoprazole 40 MG tablet Commonly  known as:  PROTONIX Take 1 tablet (40 mg total) by mouth daily.   polyethylene glycol packet Commonly known as:  MIRALAX / GLYCOLAX Take 17-25.5 g by mouth daily.   PRESERVISION AREDS 2 PO Take 1 capsule by mouth 2 (two) times daily.   QUEtiapine 50 MG tablet Commonly known as:  SEROQUEL Take 50 mg by mouth at bedtime.   rosuvastatin 40 MG tablet Commonly known as:  CRESTOR Take 20 mg by mouth every evening.   tamsulosin 0.4 MG Caps capsule Commonly known as:  FLOMAX Take 1 capsule (0.4 mg total) by mouth daily after supper.   THERATEARS OP Apply 1 drop to eye 3 (three) times daily as needed (dry eyes).   traZODone 50 MG tablet Commonly known as:  DESYREL Take 50 mg by mouth at bedtime.   Vitamin D3 2000 units capsule Take  2,000 Units by mouth 2 (two) times daily.      Follow-up Information    [name], MD. Schedule an appointment as soon as possible for a visit in 1 week(s).   Specialty:  Family Medicine Why:  with repeat CBC/BMP Contact information: [address] [place] [address] [place] [address] [phone]          Allergies  Allergen Reactions  . Oxycontin [Oxycodone Hcl] Other (See Comments)    Pt states he cant sleep w/ this med; also makes him constipated.     Consultations:  Spoke to urology on phone   Procedures/Studies: Dg Chest 2 View  Result Date: 09/14/2017 CLINICAL DATA:  84 year old male with chest pain. EXAM: CHEST  2 VIEW COMPARISON:  Chest radiograph dated 09/12/2017 FINDINGS: The heart size and mediastinal contours are within normal limits. Both lungs are clear. The visualized skeletal structures are unremarkable. IMPRESSION: No active cardiopulmonary disease. Electronically Signed   By: Anner Crete M.D.   On: 09/14/2017 22:12   Dg Chest 2 View  Result Date: 09/12/2017 CLINICAL DATA:  Bilateral lower extremity swelling. EXAM: CHEST  2 VIEW COMPARISON:  Radiograph of December 18, 2016. FINDINGS: Stable cardiomediastinal  silhouette. Both lungs are clear. No pneumothorax or pleural effusion is noted. The visualized skeletal structures are unremarkable. IMPRESSION: No active cardiopulmonary disease. Electronically Signed   By: Marijo Conception, M.D.   On: 09/12/2017 13:56   US Renal  Result Date: 09/15/2017 CLINICAL DATA:  Patient with acute renal injury. EXAM: RENAL / URINARY TRACT ULTRASOUND COMPLETE COMPARISON:  CT abdomen pelvis 12/17/2016. FINDINGS: Right Kidney: Length: 12.5 cm. Echogenicity within normal limits. No mass or hydronephrosis visualized. Left Kidney: Length: 12.2 cm. Echogenicity within normal limits. No mass or hydronephrosis visualized. There is a 1.8 cm cyst. Bladder: Decompressed with Foley catheter. IMPRESSION: No hydronephrosis. Electronically Signed   By: Lovey Newcomer M.D.   On: 09/15/2017 08:52    Bilateral lower extremity duplex ultrasound on 09/15/2017: Negative for DVT   Subjective: Patient seen and examined at bedside.  He feels much better and wants to go home.  No overnight fever, nausea or vomiting.  Discharge Exam: Vitals:   09/15/17 2047 09/16/17 0412  BP: (!) 146/77 127/72  Pulse: 80 69  Resp: 16 14  Temp: 98.1 F (36.7 C) 97.7 F (36.5 C)  SpO2: 96% 97%   Vitals:   09/15/17 1159 09/15/17 1321 09/15/17 2047 09/16/17 0412  BP: (!) 116/54 106/68 (!) 146/77 127/72  Pulse: 75 68 80 69  Resp: _0 Temp: 98.4 F (36.9 C) 98.4 F (36.9 C) 98.1 F (36.7 C) 97.7 F (36.5 C)  TempSrc: Oral Oral Oral Oral  SpO2: 95% 98% 96% 97%  Weight:  125.4 kg (276 lb 7.3 oz)  134.2 kg (295 lb 13.7 oz)  Height:  _1  (1.803 m)      General: Pt is alert, awake, not in acute distress Cardiovascular: Rate controlled, S1/S2 + Respiratory: Bilateral decreased breath sounds at bases  abdominal: Soft, NT, ND, bowel sounds + Extremities: Trace edema , no cyanosis    The results of significant diagnostics from this hospitalization (including imaging, microbiology, ancillary and  laboratory) are listed below for reference.     Microbiology: No results found for this or any previous visit (from the past 240 hour(s)).   Labs: BNP (last 3 results) Recent Labs    09/14/17 2111  BNP 99.3   Basic Metabolic Panel: Recent Labs  Lab 09/12/17 0946 09/14/17 2110 09/15/17 0650 09/16/17 0742  NA 141 135 139 140  K 4.9 4.3 3.9 4.9  CL 103 99* 101 104  CO2 _0 GLUCOSE 121* 168* 134* 93  BUN 44* 73* 73* 49*  CREATININE 1.27 2.07* 1.94* 1.30*  CALCIUM 9.3 9.0 9.0 9.1  MG  --   --   --  2.1   Liver Function Tests: Recent Labs  Lab 09/12/17 0946 09/14/17 2110 09/15/17 0650  AST _1 ALT 11 14* 14*  ALKPHOS 74 79 76  BILITOT 0.5 0.5 0.7  PROT 6.6 7.0 7.1  ALBUMIN 3.8 3.7 3.5   No results for input(s): LIPASE, AMYLASE in the last 168 hours. No results for input(s): AMMONIA in the last 168 hours. CBC: Recent Labs  Lab 09/12/17 0946 09/14/17 2110 09/16/17 0742  WBC 12.3* 15.5* 10.1  NEUTROABS 7.2 9.9* 4.9  HGB 13.3 13.3 12.9*  HCT 41.6 39.9 39.8  MCV 89.4 88.5 89.4  PLT 284.0 266 259   Cardiac Enzymes: No results for input(s): CKTOTAL, CKMB, CKMBINDEX, TROPONINI in the last 168 hours. BNP: Invalid input(s): POCBNP CBG: Recent Labs  Lab 09/15/17 0731 09/15/17 1158 09/15/17 1728 09/15/17 2124 09/16/17 0722  GLUCAP 122* 138* 186* 156* 92   D-Dimer No results for input(s): DDIMER in the last 72 hours. Hgb A1c No results for input(s): HGBA1C in the last 72 hours. Lipid Profile No results for input(s): CHOL, HDL, LDLCALC, TRIG, CHOLHDL, LDLDIRECT in the last 72 hours. Thyroid function studies No results for input(s): TSH, T4TOTAL, T3FREE, THYROIDAB in the last 72 hours.  Invalid input(s): FREET3 Anemia work up No results for input(s): VITAMINB12, FOLATE, FERRITIN, TIBC, IRON, RETICCTPCT in the last 72 hours. Urinalysis    Component Value Date/Time   COLORURINE YELLOW 09/14/2017 2110   APPEARANCEUR CLEAR 09/14/2017 2110    LABSPEC 1.010 09/14/2017 2110   PHURINE 5.0 09/14/2017 2110   GLUCOSEU NEGATIVE 09/14/2017 2110   HGBUR NEGATIVE 09/14/2017 2110   BILIRUBINUR NEGATIVE 09/14/2017 2110   BILIRUBINUR negative 12/30/2016 1308   KETONESUR NEGATIVE 09/14/2017 2110   PROTEINUR NEGATIVE 09/14/2017 2110   UROBILINOGEN 0.2 12/30/2016 1308   UROBILINOGEN 1.0 11/22/2012 1633   NITRITE NEGATIVE 09/14/2017 2110   LEUKOCYTESUR NEGATIVE 09/14/2017 2110   Sepsis Labs Invalid input(s): PROCALCITONIN,  WBC,  LACTICIDVEN Microbiology No results found for this or any previous visit (from the past 240 hour(s)).   Time coordinating discharge: 35 minutes  SIGNED:   Aline August, MD  [facility] 09/16/2017, 11:25 AM Pager: [phone]  If 7PM-7AM, please contact night-coverage [email or web address] Password [email or web address]

## 2017-09-16 NOTE — Progress Notes (Signed)
PT Cancellation Note  Patient Details Name: [name] MRN: [id number] DOB: [birth year]   Cancelled Treatment:    Reason Eval/Treat Not Completed: Other (comment) Attempted PT evaluation. Explained role of PT in the acute care and he became very belligerent. He started yelling at therapist calling the PT and the MD idiots. He states, "I can do everything I need to and it is none of your business or the doctor's."  Wife present and apologized to PT.  PT explained to wife that she will d/c pt from caseload, but if need arises that the MD can re-order.  Galen Manila 09/16/2017, 10:10 AM

## 2017-09-18 ENCOUNTER — Telehealth: Payer: Self-pay

## 2017-09-18 ENCOUNTER — Other Ambulatory Visit: Payer: Self-pay | Admitting: General Practice

## 2017-09-18 ENCOUNTER — Other Ambulatory Visit (HOSPITAL_COMMUNITY): Payer: Medicare Other

## 2017-09-18 ENCOUNTER — Other Ambulatory Visit: Payer: Self-pay | Admitting: Family Medicine

## 2017-09-18 DIAGNOSIS — I2721 Secondary pulmonary arterial hypertension: Secondary | ICD-10-CM

## 2017-09-18 MED ORDER — FREESTYLE LANCETS MISC: 12 refills | Status: AC

## 2017-09-18 MED ORDER — FREESTYLE FREEDOM LITE W/DEVICE KIT: PACK | 3 refills | Status: DC

## 2017-09-18 MED ORDER — GLUCOSE BLOOD VI STRP: ORAL_STRIP | 12 refills | Status: DC

## 2017-09-18 NOTE — Telephone Encounter (Signed)
Transition Care Management Follow-up Telephone Call   Date discharged? 09/16/2017   How have you been since you were released from the hospital? "Just bloated"   Do you understand why you were in the hospital? yes   Do you understand the discharge instructions? yes   Where were you discharged to? Home, lives with wife.    Items Reviewed:  Medications reviewed: no, meds/list not available at time of call. Pt states Lasix and Potassium are being held, other medications remain the same.   Allergies reviewed: yes  Dietary changes reviewed: yes  Referrals reviewed: yes. Pt will call Urology if he does not receive call by the end of the day.    Functional Questionnaire:   Activities of Daily Living (ADLs):   He states they are independent in the following: ambulation, bathing and hygiene, feeding, continence, grooming, toileting and dressing. Patient with foley catheter.  States they require assistance with the following: None.    Any transportation issues/concerns?: no   Any patient concerns? no   Confirmed importance and date/time of follow-up visits scheduled yes  Provider Appointment booked with PCP 09/20/17 @ 1030.   Confirmed with patient if condition begins to worsen call PCP or go to the ER.  Patient was given the office number and encouraged to call back with question or concerns.  : yes

## 2017-09-19 ENCOUNTER — Ambulatory Visit: Payer: Medicare Other | Admitting: Family Medicine

## 2017-09-19 ENCOUNTER — Other Ambulatory Visit: Payer: Self-pay

## 2017-09-19 ENCOUNTER — Inpatient Hospital Stay (HOSPITAL_COMMUNITY)
Admission: EM | Admit: 2017-09-19 | Discharge: 2017-09-22 | DRG: 698 | Disposition: A | Discharge status: Home / Self Care | Payer: Medicare Other | Attending: Internal Medicine | Admitting: Internal Medicine

## 2017-09-19 ENCOUNTER — Telehealth: Payer: Self-pay | Admitting: Family Medicine

## 2017-09-19 ENCOUNTER — Emergency Department (HOSPITAL_COMMUNITY): Payer: Medicare Other

## 2017-09-19 ENCOUNTER — Encounter (HOSPITAL_COMMUNITY): Payer: Self-pay | Admitting: Emergency Medicine

## 2017-09-19 DIAGNOSIS — B961 Klebsiella pneumoniae [K. pneumoniae] as the cause of diseases classified elsewhere: Secondary | ICD-10-CM | POA: Diagnosis present

## 2017-09-19 DIAGNOSIS — M549 Dorsalgia, unspecified: Secondary | ICD-10-CM | POA: Diagnosis present

## 2017-09-19 DIAGNOSIS — Z86711 Personal history of pulmonary embolism: Secondary | ICD-10-CM

## 2017-09-19 DIAGNOSIS — H9193 Unspecified hearing loss, bilateral: Secondary | ICD-10-CM | POA: Diagnosis present

## 2017-09-19 DIAGNOSIS — E7800 Pure hypercholesterolemia, unspecified: Secondary | ICD-10-CM | POA: Diagnosis present

## 2017-09-19 DIAGNOSIS — E871 Hypo-osmolality and hyponatremia: Secondary | ICD-10-CM | POA: Diagnosis present

## 2017-09-19 DIAGNOSIS — T402X5A Adverse effect of other opioids, initial encounter: Secondary | ICD-10-CM | POA: Diagnosis present

## 2017-09-19 DIAGNOSIS — R6521 Severe sepsis with septic shock: Secondary | ICD-10-CM

## 2017-09-19 DIAGNOSIS — R6520 Severe sepsis without septic shock: Secondary | ICD-10-CM | POA: Diagnosis not present

## 2017-09-19 DIAGNOSIS — Z794 Long term (current) use of insulin: Secondary | ICD-10-CM

## 2017-09-19 DIAGNOSIS — M1990 Unspecified osteoarthritis, unspecified site: Secondary | ICD-10-CM | POA: Diagnosis present

## 2017-09-19 DIAGNOSIS — N179 Acute kidney failure, unspecified: Secondary | ICD-10-CM | POA: Diagnosis present

## 2017-09-19 DIAGNOSIS — D649 Anemia, unspecified: Secondary | ICD-10-CM | POA: Diagnosis present

## 2017-09-19 DIAGNOSIS — K5900 Constipation, unspecified: Secondary | ICD-10-CM | POA: Diagnosis not present

## 2017-09-19 DIAGNOSIS — T83511A Infection and inflammatory reaction due to indwelling urethral catheter, initial encounter: Principal | ICD-10-CM | POA: Diagnosis present

## 2017-09-19 DIAGNOSIS — Y732 Prosthetic and other implants, materials and accessory gastroenterology and urology devices associated with adverse incidents: Secondary | ICD-10-CM | POA: Diagnosis present

## 2017-09-19 DIAGNOSIS — Y846 Urinary catheterization as the cause of abnormal reaction of the patient, or of later complication, without mention of misadventure at the time of the procedure: Secondary | ICD-10-CM | POA: Diagnosis present

## 2017-09-19 DIAGNOSIS — W010XXA Fall on same level from slipping, tripping and stumbling without subsequent striking against object, initial encounter: Secondary | ICD-10-CM | POA: Diagnosis present

## 2017-09-19 DIAGNOSIS — N3000 Acute cystitis without hematuria: Secondary | ICD-10-CM

## 2017-09-19 DIAGNOSIS — F4310 Post-traumatic stress disorder, unspecified: Secondary | ICD-10-CM | POA: Diagnosis present

## 2017-09-19 DIAGNOSIS — R402413 Glasgow coma scale score 13-15, at hospital admission: Secondary | ICD-10-CM | POA: Diagnosis present

## 2017-09-19 DIAGNOSIS — Z974 Presence of external hearing-aid: Secondary | ICD-10-CM

## 2017-09-19 DIAGNOSIS — I10 Essential (primary) hypertension: Secondary | ICD-10-CM | POA: Diagnosis present

## 2017-09-19 DIAGNOSIS — N410 Acute prostatitis: Secondary | ICD-10-CM | POA: Diagnosis present

## 2017-09-19 DIAGNOSIS — A419 Sepsis, unspecified organism: Secondary | ICD-10-CM

## 2017-09-19 DIAGNOSIS — Z9119 Patient's noncompliance with other medical treatment and regimen: Secondary | ICD-10-CM

## 2017-09-19 DIAGNOSIS — K5903 Drug induced constipation: Secondary | ICD-10-CM | POA: Diagnosis present

## 2017-09-19 DIAGNOSIS — N390 Urinary tract infection, site not specified: Secondary | ICD-10-CM | POA: Diagnosis present

## 2017-09-19 DIAGNOSIS — G8929 Other chronic pain: Secondary | ICD-10-CM | POA: Diagnosis present

## 2017-09-19 DIAGNOSIS — Y92002 Bathroom of unspecified non-institutional (private) residence single-family (private) house as the place of occurrence of the external cause: Secondary | ICD-10-CM

## 2017-09-19 DIAGNOSIS — Y95 Nosocomial condition: Secondary | ICD-10-CM | POA: Diagnosis present

## 2017-09-19 DIAGNOSIS — R7881 Bacteremia: Secondary | ICD-10-CM | POA: Diagnosis not present

## 2017-09-19 DIAGNOSIS — K219 Gastro-esophageal reflux disease without esophagitis: Secondary | ICD-10-CM | POA: Diagnosis present

## 2017-09-19 DIAGNOSIS — G4733 Obstructive sleep apnea (adult) (pediatric): Secondary | ICD-10-CM | POA: Diagnosis present

## 2017-09-19 DIAGNOSIS — Z6841 Body Mass Index (BMI) 40.0 and over, adult: Secondary | ICD-10-CM

## 2017-09-19 DIAGNOSIS — R339 Retention of urine, unspecified: Secondary | ICD-10-CM | POA: Diagnosis not present

## 2017-09-19 DIAGNOSIS — Z8711 Personal history of peptic ulcer disease: Secondary | ICD-10-CM

## 2017-09-19 DIAGNOSIS — J9811 Atelectasis: Secondary | ICD-10-CM | POA: Diagnosis not present

## 2017-09-19 DIAGNOSIS — Z720 Tobacco use: Secondary | ICD-10-CM | POA: Diagnosis not present

## 2017-09-19 DIAGNOSIS — R5383 Other fatigue: Secondary | ICD-10-CM | POA: Diagnosis not present

## 2017-09-19 DIAGNOSIS — Z79899 Other long term (current) drug therapy: Secondary | ICD-10-CM

## 2017-09-19 DIAGNOSIS — Z885 Allergy status to narcotic agent status: Secondary | ICD-10-CM

## 2017-09-19 DIAGNOSIS — E119 Type 2 diabetes mellitus without complications: Secondary | ICD-10-CM | POA: Diagnosis present

## 2017-09-19 DIAGNOSIS — R031 Nonspecific low blood-pressure reading: Secondary | ICD-10-CM | POA: Diagnosis not present

## 2017-09-19 LAB — CBC WITH DIFFERENTIAL/PLATELET
BASOS ABS: 0 10*3/uL (ref 0.0–0.1)
Basophils Relative: 0 %
EOS PCT: 0 %
Eosinophils Absolute: 0 10*3/uL (ref 0.0–0.7)
HEMATOCRIT: 37.4 % — AB (ref 39.0–52.0)
HEMOGLOBIN: 12.3 g/dL — AB (ref 13.0–17.0)
LYMPHS ABS: 1.9 10*3/uL (ref 0.7–4.0)
LYMPHS PCT: 6 %
MCH: 29.4 pg (ref 26.0–34.0)
MCHC: 32.9 g/dL (ref 30.0–36.0)
MCV: 89.3 fL (ref 78.0–100.0)
MONOS PCT: 9 %
Monocytes Absolute: 2.9 10*3/uL — ABNORMAL HIGH (ref 0.1–1.0)
NEUTROS ABS: 27.3 10*3/uL — AB (ref 1.7–7.7)
Neutrophils Relative %: 85 %
Platelets: 213 10*3/uL (ref 150–400)
RBC: 4.19 MIL/uL — ABNORMAL LOW (ref 4.22–5.81)
RDW: 13.7 % (ref 11.5–15.5)
WBC: 32.1 10*3/uL — ABNORMAL HIGH (ref 4.0–10.5)

## 2017-09-19 LAB — PROCALCITONIN: Procalcitonin: 1.02 ng/mL

## 2017-09-19 LAB — COMPREHENSIVE METABOLIC PANEL
ALBUMIN: 3.5 g/dL (ref 3.5–5.0)
ALT: 15 U/L — AB (ref 17–63)
AST: 17 U/L (ref 15–41)
Alkaline Phosphatase: 73 U/L (ref 38–126)
Anion gap: 9 (ref 5–15)
BUN: 46 mg/dL — AB (ref 6–20)
CHLORIDE: 99 mmol/L — AB (ref 101–111)
CO2: 26 mmol/L (ref 22–32)
Calcium: 9.1 mg/dL (ref 8.9–10.3)
Creatinine, Ser: 2.9 mg/dL — ABNORMAL HIGH (ref 0.61–1.24)
GFR calc Af Amer: 22 mL/min — ABNORMAL LOW (ref >60)
GFR, EST NON AFRICAN AMERICAN: 19 mL/min — AB (ref >60)
GLUCOSE: 153 mg/dL — AB (ref 65–99)
Potassium: 4.6 mmol/L (ref 3.5–5.1)
SODIUM: 134 mmol/L — AB (ref 135–145)
Total Bilirubin: 1 mg/dL (ref 0.3–1.2)
Total Protein: 6.8 g/dL (ref 6.5–8.1)

## 2017-09-19 LAB — URINALYSIS, MICROSCOPIC (REFLEX)

## 2017-09-19 LAB — GLUCOSE, CAPILLARY
Glucose-Capillary: 125 mg/dL — ABNORMAL HIGH (ref 65–99)
Glucose-Capillary: 178 mg/dL — ABNORMAL HIGH (ref 65–99)
Glucose-Capillary: 113 mg/dL — ABNORMAL HIGH (ref 65–99)

## 2017-09-19 LAB — CREATININE, URINE, RANDOM: Creatinine, Urine: 216.23 mg/dL

## 2017-09-19 LAB — URINALYSIS, ROUTINE W REFLEX MICROSCOPIC
Bilirubin Urine: NEGATIVE
Glucose, UA: NEGATIVE mg/dL
HGB URINE DIPSTICK: NEGATIVE
Nitrite: NEGATIVE
PROTEIN: NEGATIVE mg/dL
pH: 6 (ref 5.0–8.0)

## 2017-09-19 LAB — I-STAT CG4 LACTIC ACID, ED: Lactic Acid, Venous: 1.08 mmol/L (ref 0.5–1.9)

## 2017-09-19 LAB — SODIUM, URINE, RANDOM: Sodium, Ur: 28 mmol/L

## 2017-09-19 LAB — MICROALBUMIN, URINE: MICROALB UR: 0

## 2017-09-19 LAB — PROTIME-INR
INR: 1.12
Prothrombin Time: 14.3 seconds (ref 11.4–15.2)

## 2017-09-19 LAB — MRSA PCR SCREENING: MRSA BY PCR: NEGATIVE

## 2017-09-19 LAB — BRAIN NATRIURETIC PEPTIDE: B NATRIURETIC PEPTIDE 5: 72 pg/mL (ref 0.0–100.0)

## 2017-09-19 MED ORDER — SODIUM CHLORIDE 0.9 % IV SOLN: 250.0000 mL | INTRAVENOUS | Status: DC | PRN

## 2017-09-19 MED ORDER — TAMSULOSIN HCL 0.4 MG PO CAPS
0.4000 mg | ORAL_CAPSULE | Freq: Every day | ORAL | Status: DC
  Administered 2017-09-19 – 2017-09-21 (×3): 0.4 mg via ORAL
  Filled 2017-09-19 (×3): qty 1

## 2017-09-19 MED ORDER — ONDANSETRON HCL 4 MG/2ML IJ SOLN: 4.0000 mg | Freq: Four times a day (QID) | INTRAMUSCULAR | Status: DC | PRN

## 2017-09-19 MED ORDER — HEPARIN SODIUM (PORCINE) 5000 UNIT/ML IJ SOLN
5000.0000 [IU] | Freq: Three times a day (TID) | INTRAMUSCULAR | Status: DC
  Administered 2017-09-19 – 2017-09-22 (×9): 5000 [IU] via SUBCUTANEOUS
  Filled 2017-09-19 (×9): qty 1

## 2017-09-19 MED ORDER — DOCUSATE SODIUM 100 MG PO CAPS
100.0000 mg | ORAL_CAPSULE | Freq: Two times a day (BID) | ORAL | Status: DC
  Administered 2017-09-19 – 2017-09-21 (×5): 100 mg via ORAL
  Filled 2017-09-19 (×6): qty 1

## 2017-09-19 MED ORDER — ACETAMINOPHEN 325 MG PO TABS
650.0000 mg | ORAL_TABLET | Freq: Four times a day (QID) | ORAL | Status: DC | PRN
  Administered 2017-09-20: 650 mg via ORAL
  Filled 2017-09-19: qty 2

## 2017-09-19 MED ORDER — LACTATED RINGERS IV BOLUS (SEPSIS)
1000.0000 mL | Freq: Once | INTRAVENOUS | Status: AC
  Administered 2017-09-19: 1000 mL via INTRAVENOUS

## 2017-09-19 MED ORDER — BOOST PO LIQD
237.0000 mL | Freq: Every day | ORAL | Status: DC | PRN
  Filled 2017-09-19: qty 237

## 2017-09-19 MED ORDER — TRAZODONE HCL 50 MG PO TABS
50.0000 mg | ORAL_TABLET | Freq: Every day | ORAL | Status: DC
  Administered 2017-09-19 – 2017-09-21 (×3): 50 mg via ORAL
  Filled 2017-09-19 (×3): qty 1

## 2017-09-19 MED ORDER — DEXTROSE 5 % IV SOLN: 1.0000 g | INTRAVENOUS | Status: DC

## 2017-09-19 MED ORDER — VANCOMYCIN HCL 10 G IV SOLR
2500.0000 mg | Freq: Once | INTRAVENOUS | Status: AC
  Administered 2017-09-19: 2500 mg via INTRAVENOUS
  Filled 2017-09-19: qty 2000

## 2017-09-19 MED ORDER — LACTATED RINGERS IV SOLN
INTRAVENOUS | Status: DC
  Administered 2017-09-19 – 2017-09-21 (×4): via INTRAVENOUS

## 2017-09-19 MED ORDER — INSULIN ASPART 100 UNIT/ML ~~LOC~~ SOLN
4.0000 [IU] | Freq: Three times a day (TID) | SUBCUTANEOUS | Status: DC
  Administered 2017-09-19 – 2017-09-21 (×6): 4 [IU] via SUBCUTANEOUS

## 2017-09-19 MED ORDER — INSULIN ASPART 100 UNIT/ML ~~LOC~~ SOLN: 0.0000 [IU] | Freq: Every day | SUBCUTANEOUS | Status: DC

## 2017-09-19 MED ORDER — NOREPINEPHRINE BITARTRATE 1 MG/ML IV SOLN
0.0000 ug/min | Freq: Once | INTRAVENOUS | Status: AC
  Administered 2017-09-19: 3 ug/min via INTRAVENOUS
  Filled 2017-09-19: qty 4

## 2017-09-19 MED ORDER — PANTOPRAZOLE SODIUM 40 MG PO TBEC
40.0000 mg | DELAYED_RELEASE_TABLET | Freq: Every day | ORAL | Status: DC
  Administered 2017-09-19 – 2017-09-22 (×4): 40 mg via ORAL
  Filled 2017-09-19 (×4): qty 1

## 2017-09-19 MED ORDER — VITAMIN D 1000 UNITS PO TABS
2000.0000 [IU] | ORAL_TABLET | Freq: Two times a day (BID) | ORAL | Status: DC
  Administered 2017-09-19 – 2017-09-22 (×7): 2000 [IU] via ORAL
  Filled 2017-09-19 (×7): qty 2

## 2017-09-19 MED ORDER — QUETIAPINE FUMARATE 50 MG PO TABS
50.0000 mg | ORAL_TABLET | Freq: Every day | ORAL | Status: DC
  Administered 2017-09-19 – 2017-09-21 (×3): 50 mg via ORAL
  Filled 2017-09-19 (×3): qty 1

## 2017-09-19 MED ORDER — CEFTRIAXONE SODIUM 2 G IJ SOLR: 2.0000 g | INTRAMUSCULAR | Status: DC

## 2017-09-19 MED ORDER — LACTATED RINGERS IV BOLUS (SEPSIS): 1000.0000 mL | Freq: Once | INTRAVENOUS | Status: DC

## 2017-09-19 MED ORDER — MAGNESIUM OXIDE 400 (241.3 MG) MG PO TABS
400.0000 mg | ORAL_TABLET | Freq: Two times a day (BID) | ORAL | Status: DC
  Administered 2017-09-19 – 2017-09-21 (×5): 400 mg via ORAL
  Filled 2017-09-19 (×6): qty 1

## 2017-09-19 MED ORDER — INSULIN ASPART 100 UNIT/ML ~~LOC~~ SOLN
0.0000 [IU] | Freq: Three times a day (TID) | SUBCUTANEOUS | Status: DC
  Administered 2017-09-19: 3 [IU] via SUBCUTANEOUS
  Administered 2017-09-19 – 2017-09-20 (×2): 2 [IU] via SUBCUTANEOUS
  Administered 2017-09-20: 3 [IU] via SUBCUTANEOUS
  Administered 2017-09-21 – 2017-09-22 (×2): 2 [IU] via SUBCUTANEOUS
  Administered 2017-09-22: 3 [IU] via SUBCUTANEOUS

## 2017-09-19 MED ORDER — POLYETHYLENE GLYCOL 3350 17 G PO PACK
17.0000 g | PACK | Freq: Every day | ORAL | Status: DC
  Administered 2017-09-19 – 2017-09-21 (×3): 17 g via ORAL
  Filled 2017-09-19 (×3): qty 1

## 2017-09-19 MED ORDER — SODIUM CHLORIDE 0.9 % IV BOLUS (SEPSIS)
2500.0000 mL | Freq: Once | INTRAVENOUS | Status: AC
  Administered 2017-09-19: 2500 mL via INTRAVENOUS

## 2017-09-19 MED ORDER — CEFTRIAXONE SODIUM 2 G IJ SOLR
2.0000 g | INTRAMUSCULAR | Status: AC
  Administered 2017-09-19: 2 g via INTRAVENOUS
  Filled 2017-09-19: qty 2

## 2017-09-19 MED ORDER — ROSUVASTATIN CALCIUM 20 MG PO TABS
20.0000 mg | ORAL_TABLET | Freq: Every evening | ORAL | Status: DC
  Administered 2017-09-19 – 2017-09-21 (×3): 20 mg via ORAL
  Filled 2017-09-19 (×3): qty 1

## 2017-09-19 MED ORDER — DEXTROSE 5 % IV SOLN
2.0000 g | INTRAVENOUS | Status: DC
  Administered 2017-09-20: 2 g via INTRAVENOUS
  Filled 2017-09-19: qty 2

## 2017-09-19 MED ORDER — PHENYLEPHRINE HCL 10 MG/ML IJ SOLN
0.0000 ug/min | INTRAMUSCULAR | Status: DC
  Administered 2017-09-19: 60 ug/min via INTRAVENOUS
  Administered 2017-09-19: 80 ug/min via INTRAVENOUS
  Administered 2017-09-19: 13.333 ug/min via INTRAVENOUS
  Administered 2017-09-19: 70 ug/min via INTRAVENOUS
  Administered 2017-09-20: 60 ug/min via INTRAVENOUS
  Administered 2017-09-20: 50 ug/min via INTRAVENOUS
  Filled 2017-09-19 (×8): qty 1

## 2017-09-19 NOTE — ED Notes (Signed)
ED Provider at bedside. 

## 2017-09-19 NOTE — ED Notes (Signed)
Dr.Nanavati notified of pt temp and possible sepsis status.

## 2017-09-19 NOTE — Progress Notes (Signed)
A consult was received from an ED physician for rocephin and vancomycin per pharmacy dosing.  The patient's profile has been reviewed for ht/wt/allergies/indication/available labs.   A one time order has been placed for Rocephin 2 Gm and Vancomycin 2500 mg.  Further antibiotics/pharmacy consults should be ordered by admitting physician if indicated.                       Thank you, Dorrene German 09/19/2017  6:10 AM

## 2017-09-19 NOTE — Consult Note (Deleted)
PULMONARY / CRITICAL CARE MEDICINE   Name: [name] MRN: [id number] DOB: [birth year]    ADMISSION DATE:  09/19/2017 CONSULTATION DATE:  09/19/17  REFERRING MD:  Dr. Kathrynn Humble   CHIEF COMPLAINT:  Urinary retention  HISTORY OF PRESENT ILLNESS:   84 y/o M    Recent admit from 1/17 - 1/19 per [facility] for urinary retention / oliguria and AKI.  He was discharged home with plans to follow up with Urology as an outpatient (with foley catheter in place).    PAST MEDICAL HISTORY :  He  has a past medical history of Arthritis, Chronic back pain, Constipation due to opioid therapy, Diabetes mellitus without complication ([place]), Full dentures, Gallbladder attack, Gastric perforation ([place]), GERD (gastroesophageal reflux disease), Hypercholesteremia, Hypertension, Pneumonia, Post traumatic stress disorder (PTSD), Pulmonary embolism ([place]), Sleep apnea, Wears glasses, and Wears hearing aid.  PAST SURGICAL HISTORY: He  has a past surgical history that includes Rotator cuff repair; Ankle surgery; Lumbar laminectomy/decompression microdiscectomy (Left, 11/15/2012); laparoscopy (N/A, 11/17/2012); Colonoscopy; Trigger finger release (Left, 01/21/2014); Back surgery; bowel perforation surgery; ORIF calcaneous fracture (Right, 02/19/2015); ir generic historical (07/21/2016); ir generic historical (08/15/2016); ir generic historical (08/25/2016); ir generic historical (09/05/2016); and Cholecystectomy (N/A, 01/16/2017).  Allergies  Allergen Reactions  . Oxycontin [Oxycodone Hcl] Other (See Comments)    Pt states he cant sleep w/ this med; also makes him constipated.     No current facility-administered medications on file prior to encounter.    Current Outpatient Medications on File Prior to Encounter  Medication Sig  . Carboxymethylcellulose Sodium (THERATEARS OP) Apply 1 drop to eye 3 (three) times daily as needed (dry eyes).  . carvedilol (COREG) 3.125 MG tablet TAKE 1 TABLET TWICE A DAY WITH MEALS  .  Cholecalciferol (VITAMIN D3) 2000 units capsule Take 2,000 Units by mouth 2 (two) times daily.   . cyanocobalamin 500 MCG tablet Take 500 mcg by mouth 2 (two) times daily.  Marland Kitchen docusate sodium (COLACE) 100 MG capsule Take 1 capsule (100 mg total) by mouth 2 (two) times daily.  Marland Kitchen galantamine (RAZADYNE ER) 16 MG 24 hr capsule Take 16 mg by mouth daily with breakfast.  . HYDROmorphone (DILAUDID) 2 MG tablet Take 1 tablet (2 mg total) by mouth 2 (two) times daily as needed for severe pain.  Marland Kitchen insulin glargine (LANTUS) 100 UNIT/ML injection Inject 20 Units into the skin at bedtime.   . insulin lispro (HUMALOG) 100 UNIT/ML injection Inject 0-5 Units into the skin 3 (three) times daily before meals. 0-200 = 0 units, 201-250 = 2 units, 251-300 = 3 units, 301-350 = 4 units, 351-400 = 5 units, >401 call MD/NP  . lactose free nutrition (BOOST) LIQD Take 237 mLs by mouth daily as needed (nutrition).  . magnesium oxide (MAG-OX) 400 MG tablet Take 400 mg by mouth 2 (two) times daily.  . Melatonin 5 MG TABS Take 5 mg by mouth at bedtime.  . methocarbamol (ROBAXIN) 750 MG tablet Take 1 tablet (750 mg total) by mouth every 6 (six) hours as needed for muscle spasms.  . Multiple Vitamins-Minerals (PRESERVISION AREDS 2 PO) Take 1 capsule by mouth 2 (two) times daily.  . pantoprazole (PROTONIX) 40 MG tablet Take 1 tablet (40 mg total) by mouth daily.  . polyethylene glycol (MIRALAX / GLYCOLAX) packet Take 17-25.5 g by mouth daily.   . QUEtiapine (SEROQUEL) 50 MG tablet Take 50 mg by mouth at bedtime.   . rosuvastatin (CRESTOR) 40 MG tablet Take 20 mg by mouth every  evening.  . tamsulosin (FLOMAX) 0.4 MG CAPS capsule Take 1 capsule (0.4 mg total) by mouth daily after supper.  . traZODone (DESYREL) 50 MG tablet Take 50 mg by mouth at bedtime.  . Blood Glucose Monitoring Suppl (FREESTYLE FREEDOM LITE) w/Device KIT Use glucometer to check sugars 4 times daily. Dx E11.9  . glucose blood (FREESTYLE LITE) test strip Use as  instructed to check sugars 4 times daily. Dx E11.9  . Lancets (FREESTYLE) lancets Use as instructed to check sugars 4 times daily. Dx E11.9    FAMILY HISTORY:  His indicated that his mother is deceased. He indicated that his father is deceased. He indicated that the status of his neg hx is unknown.   SOCIAL HISTORY: He  reports that  has never smoked. His smokeless tobacco use includes chew. He reports that he drinks alcohol. He reports that he does not use drugs.  REVIEW OF SYSTEMS:  POSITIVES IN BOLD Gen: Denies fever, chills, weight change, fatigue, night sweats HEENT: Denies blurred vision, double vision, hearing loss, tinnitus, sinus congestion, rhinorrhea, sore throat, neck stiffness, dysphagia PULM: Denies shortness of breath, cough, sputum production, hemoptysis, wheezing CV: Denies chest pain, edema, orthopnea, paroxysmal nocturnal dyspnea, palpitations GI: Denies abdominal pain, nausea, vomiting, diarrhea, hematochezia, melena, constipation, change in bowel habits GU: Denies dysuria, hematuria, polyuria, oliguria, urethral discharge Endocrine: Denies hot or cold intolerance, polyuria, polyphagia or appetite change Derm: Denies rash, dry skin, scaling or peeling skin change Heme: Denies easy bruising, bleeding, bleeding gums Neuro: Denies headache, numbness, weakness, slurred speech, loss of memory or consciousness   SUBJECTIVE:   VITAL SIGNS: BP (!) 88/72   Pulse 90   Temp (!) 101.3 F (38.5 C) (Rectal)   Resp 20   Ht '5\' 11"'  (1.803 m)   Wt 260 lb (117.9 kg)   SpO2 98%   BMI 36.26 kg/m   HEMODYNAMICS:    VENTILATOR SETTINGS:    INTAKE / OUTPUT: No intake/output data recorded.  PHYSICAL EXAMINATION: General: elderly male in NAD, lying on ER stretcher  HEENT: MM pink/moist, hearing aides in place Neuro: AAOx4, speech clear, MAE  CV: s1s2 rrr, no m/r/g PULM: even/non-labored, lungs bilaterally clear  UK:RCVK, non-tender, bsx4 active  Extremities: warm/dry,  1-2+ BLE pitting edema  Skin: no rashes or lesions, multiple tattoos   LABS:  BMET Recent Labs  Lab 09/15/17 0650 09/16/17 0742 09/19/17 0430  NA 139 140 134*  K 3.9 4.9 4.6  CL 101 104 99*  CO2 '29 30 26  ' BUN 73* 49* 46*  CREATININE 1.94* 1.30* 2.90*  GLUCOSE 134* 93 153*    Electrolytes Recent Labs  Lab 09/15/17 0650 09/16/17 0742 09/19/17 0430  CALCIUM 9.0 9.1 9.1  MG  --  2.1  --     CBC Recent Labs  Lab 09/14/17 2110 09/16/17 0742 09/19/17 0430  WBC 15.5* 10.1 32.1*  HGB 13.3 12.9* 12.3*  HCT 39.9 39.8 37.4*  PLT 266 259 213    Coag's Recent Labs  Lab 09/19/17 0430  INR 1.12    Sepsis Markers Recent Labs  Lab 09/19/17 0447  LATICACIDVEN 1.08    ABG No results for input(s): PHART, PCO2ART, PO2ART in the last 168 hours.  Liver Enzymes Recent Labs  Lab 09/14/17 2110 09/15/17 0650 09/19/17 0430  AST '17 16 17  ' ALT 14* 14* 15*  ALKPHOS 79 76 73  BILITOT 0.5 0.7 1.0  ALBUMIN 3.7 3.5 3.5    Cardiac Enzymes Recent Labs  Lab 09/12/17 0946  PROBNP 74.0    Glucose Recent Labs  Lab 09/15/17 0731 09/15/17 1158 09/15/17 1728 09/15/17 2124 09/16/17 0722 09/16/17 1119  GLUCAP 122* 138* 186* 156* 92 149*    Imaging Dg Chest 2 View  Result Date: 09/19/2017 CLINICAL DATA:  Unexplained weight gain. Urinary retention. Hypotension. EXAM: CHEST  2 VIEW COMPARISON:  09/14/2017 FINDINGS: Shallow inspiration with elevation of right hemidiaphragm and linear atelectasis in the lung bases. No focal consolidation. No blunting of costophrenic angles. No pneumothorax. Heart size and pulmonary vascularity are normal. No edema. IMPRESSION: Shallow inspiration with linear atelectasis in the lung bases. Electronically Signed   By: Lucienne Capers M.D.   On: 09/19/2017 06:05   Ct Renal Stone Study  Result Date: 09/19/2017 CLINICAL DATA:  Urinary tract infection, hypotension. Leukocytosis. Constipation. Urinary retention. EXAM: CT ABDOMEN AND PELVIS  WITHOUT CONTRAST TECHNIQUE: Multidetector CT imaging of the abdomen and pelvis was performed following the standard protocol without IV contrast. COMPARISON:  Multiple exams, including 12/17/2016 and ultrasound of 09/15/2017 FINDINGS: Lower chest: Chronic right lower lobe peribronchovascular volume loss. Coronary and descending thoracic aortic atherosclerotic calcification. Hepatobiliary: Punctate calcifications in the liver are compatible with old granulomatous disease. Cholecystectomy. Pancreas: Unremarkable Spleen: Unremarkable Adrenals/Urinary Tract: Adrenal glands normal. 1.8 by 1.4 cm exophytic lesion of the left kidney lower pole, internal density 0 Hounsfield units, favoring cyst. Bilateral perirenal stranding appears chronic. Small caliber urinary bladder contains a Foley catheter. There is urinary bladder wall thickening along with scattered loculations of gas density within the urinary bladder. The scattering of gas loculations is somewhat atypical and could be secondary to infection but I am skeptical that the gas is actually within the bladder wall. Perivesical stranding. Possible small posterior bladder roof diverticulum similar to prior. Stomach/Bowel: Scattered sigmoid colon diverticula without diverticulitis. Appendix normal. Upper normal amount of colonic stool. Vascular/Lymphatic: Aortoiliac atherosclerotic vascular disease. Small pelvic and retroperitoneal lymph nodes are not pathologically enlarged by size criteria. Reproductive: Subtle periprostatic stranding but similar to prior. Other: Faint stranding in the central mesentery for example on image 48/2, slightly increased from prior. Musculoskeletal: Mild male pelvic floor laxity. Remote superior endplate compression at L2. Congenitally short pedicles in the lumbar spine with spondylosis and degenerative disc disease contributing to impingement at L2-3, L3-4, and L4-5. Postoperative findings in the lower lumbar spine. Suspected gas within a  synovial cyst posteriorly at L2-contributing to the central narrowing of the thecal sac at this level. IMPRESSION: 1. Chronic wall thickening and perivesical stranding in the urinary bladder with a Foley catheter in place. Appearance likely reflects chronic cystitis. Scattered locules of gas within the contents of the urinary bladder. Questionable mild chronic prostatitis with faint periprostatic stranding. 2. There is some faint stranding in the central mesentery which is nonspecific. No nodularity. 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 3. Upper normal amount of colonic stool. 4. Other imaging findings of potential clinical significance: Chronic atelectasis in the right lower lobe. Aortic Atherosclerosis (ICD10-I70.0). Coronary atherosclerosis. Sigmoid diverticulosis. Suspected mild male pelvic floor laxity. Lumbar impingement at L2-, L3-4, and L4-5. Electronically Signed   By: Van Clines M.D.   On: 09/19/2017 08:00     STUDIES:    CULTURES: UA 1/22 >> trace ketones, nitrite neg, mod leukocytes, many bacteria, 6-30 WBC BCx2 1/22 >>   ANTIBIOTICS: Vanco x1 1/22  Rocephin 1/22 >>   SIGNIFICANT EVENTS: 1/22 Admit with suspected urosepsis  LINES/TUBES: Foley (placed during 1/17 admit) >>   DISCUSSION: 84 y/o M admitted  1/22 with decreased UOP, dysuria   ASSESSMENT / PLAN:  PULMONARY A: Hx OSA - does not use CPAP Hx PE P:   Pulmonary hygiene   CARDIOVASCULAR A:  Hx HTN P:  Hold home coreg LR @ 100 ml/hr ICU monitoring  Neosynephrine PIV for hypotension   RENAL A:   Hyponatremia  AKI  Recent Urinary Retention - s/p foley catheter placement, was to see Urology in am 1/23. P:   Trend BMP / urinary output Replace electrolytes as indicated Avoid nephrotoxic agents, ensure adequate renal perfusion Urology consulted, appreciate input  See ID  ?? If Galantamine could be causing his retention / also has to be renally  dosed  GASTROINTESTINAL A:   Hx GERD, Constipation, Cholecystectomy P:   Carb modified diet  HEMATOLOGIC A:   Leukocytosis Anemia - mild  P:  Trend CBC Heparin for DVT prophylaxis   INFECTIOUS A:   UTI  Septic Shock  P:   Follow cultures Rocephin as above  Hold further vanco for now  Defer foley catheter to Urology   ENDOCRINE A:   DM  P:   SSI  Hold home lantus for 1/22  NEUROLOGIC A:   Hx Arthritis, Chronic Pain, PTSD P:   RASS goal: n/a Hold home dilaudid PRN percocet for pain    FAMILY  - Updates: Wife and patient updated at bedside am 1/22.   - Inter-disciplinary family meet or Palliative Care meeting due by: 1/29   Noe Gens, NP-C [facility] Pgr: [phone] or if no answer [phone] 09/19/2017, 8:09 AM
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
## 2017-09-19 NOTE — ED Notes (Signed)
Patient transported to CT 

## 2017-09-19 NOTE — Telephone Encounter (Signed)
Per Levada Dy, appt  Has been canceled.

## 2017-09-19 NOTE — ED Triage Notes (Signed)
Pt from home via EMS with complaints of urinary retention and constipation. Pt reports no BM x 4 days. Pt is hypotensive with EMS. Pt states last time he was seen "he had to have 40 lbs of fluid removed". Pt denies pain. Pt has foley catheter with slight amount of urine output.
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
## 2017-09-19 NOTE — Progress Notes (Signed)
Pharmacy Antibiotic Note  [name] is a 84 y.o. male admitted on 09/19/2017 with urinary retention.  Pharmacy has been consulted for ceftriaxone dosing.  Plan: Ceftriaxone 2g IV q24h No further dose adjustments needed Pharmacy will sign off, but will follow along with CCM  Height: 5\' 11"  (180.3 cm) Weight: 260 lb (117.9 kg) IBW/kg (Calculated) : 75.3  Temp (24hrs), Avg:101 F (38.3 C), Min:100.6 F (38.1 C), Max:101.3 F (38.5 C)  Recent Labs  Lab 09/14/17 2110 09/15/17 0650 09/16/17 0742 09/19/17 0430 09/19/17 0447  WBC 15.5*  --  10.1 32.1*  --   CREATININE 2.07* 1.94* 1.30* 2.90*  --   LATICACIDVEN  --   --   --   --  1.08    Estimated Creatinine Clearance: 25.2 mL/min (A) (by C-G formula based on SCr of 2.9 mg/dL (H)).    Allergies  Allergen Reactions  . Oxycontin [Oxycodone Hcl] Other (See Comments)    Pt states he cant sleep w/ this med; also makes him constipated.     Antimicrobials this admission: 1/22 Vancomycin x 1 1/22 Ceftriaxone >>  Dose adjustments this admission: none  Microbiology results: 1/22 BCx: sent 1/22 UCx: sent  Thank you for allowing pharmacy to be a part of this patient's care.  Peggyann Juba, PharmD, BCPS Pager: [phone] 09/19/2017 10:07 AM

## 2017-09-19 NOTE — H&P (Signed)
PULMONARY / CRITICAL CARE MEDICINE   Name: JANES COLEGROVE MRN: [id number] DOB: 1932/03/28    ADMISSION DATE:  09/19/2017 CONSULTATION DATE:  09/19/17  REFERRING MD:  Dr. Kathrynn Humble   CHIEF COMPLAINT:  Urinary retention  HISTORY OF PRESENT ILLNESS:   84 y/o M with recent admit from 1/17 - 1/19 per [facility] for urinary retention / oliguria and AKI.  He was discharged home with plans to follow up with Urology as an outpatient (with foley catheter in place).  The patients wife recorded his UOP and he had regular output on 1/19, 1/20.  However it slowed the am of 1/21 and he began having complaints of difficulty voiding / pain with urination.  He also reported constipation and weakness.  He went to the restroom this am and was too weak to get up.  EMS was called and he was found to be hypotensive.  The patient was given IVF's with some improvement in blood pressure.  In ER, he continued to have low BP readings.  He was given 2500 ml NS.  Urine was assessed with concerns for UTI.  He was treated with IV vancomycin + rocephin in the ER.  Despite IVF, his blood pressure did not improve.  Lactic acid was negative and patient's mental status remained intact.  Labs concerning for AKI.    PCCM called for ICU admit.   PAST MEDICAL HISTORY :  He  has a past medical history of Arthritis, Chronic back pain, Constipation due to opioid therapy, Diabetes mellitus without complication ([place]), Full dentures, Gallbladder attack, Gastric perforation ([place]), GERD (gastroesophageal reflux disease), Hypercholesteremia, Hypertension, Pneumonia, Post traumatic stress disorder (PTSD), Pulmonary embolism ([place]), Sleep apnea, Wears glasses, and Wears hearing aid.  PAST SURGICAL HISTORY: He  has a past surgical history that includes Rotator cuff repair; Ankle surgery; Lumbar laminectomy/decompression microdiscectomy (Left, 11/15/2012); laparoscopy (N/A, 11/17/2012); Colonoscopy; Trigger finger release (Left, 01/21/2014); Back surgery; bowel  perforation surgery; ORIF calcaneous fracture (Right, 02/19/2015); ir generic historical (07/21/2016); ir generic historical (08/15/2016); ir generic historical (08/25/2016); ir generic historical (09/05/2016); and Cholecystectomy (N/A, 01/16/2017).  Allergies  Allergen Reactions  . Oxycontin [Oxycodone Hcl] Other (See Comments)    Pt states he cant sleep w/ this med; also makes him constipated.     No current facility-administered medications on file prior to encounter.    Current Outpatient Medications on File Prior to Encounter  Medication Sig  . Carboxymethylcellulose Sodium (THERATEARS OP) Apply 1 drop to eye 3 (three) times daily as needed (dry eyes).  . carvedilol (COREG) 3.125 MG tablet TAKE 1 TABLET TWICE A DAY WITH MEALS  . Cholecalciferol (VITAMIN D3) 2000 units capsule Take 2,000 Units by mouth 2 (two) times daily.   . cyanocobalamin 500 MCG tablet Take 500 mcg by mouth 2 (two) times daily.  Marland Kitchen docusate sodium (COLACE) 100 MG capsule Take 1 capsule (100 mg total) by mouth 2 (two) times daily.  Marland Kitchen galantamine (RAZADYNE ER) 16 MG 24 hr capsule Take 16 mg by mouth daily with breakfast.  . HYDROmorphone (DILAUDID) 2 MG tablet Take 1 tablet (2 mg total) by mouth 2 (two) times daily as needed for severe pain.  Marland Kitchen insulin glargine (LANTUS) 100 UNIT/ML injection Inject 20 Units into the skin at bedtime.   . insulin lispro (HUMALOG) 100 UNIT/ML injection Inject 0-5 Units into the skin 3 (three) times daily before meals. 0-200 = 0 units, 201-250 = 2 units, 251-300 = 3 units, 301-350 = 4 units, 351-400 = 5 units, >401 call  MD/NP  . lactose free nutrition (BOOST) LIQD Take 237 mLs by mouth daily as needed (nutrition).  . magnesium oxide (MAG-OX) 400 MG tablet Take 400 mg by mouth 2 (two) times daily.  . Melatonin 5 MG TABS Take 5 mg by mouth at bedtime.  . methocarbamol (ROBAXIN) 750 MG tablet Take 1 tablet (750 mg total) by mouth every 6 (six) hours as needed for muscle spasms.  . Multiple  Vitamins-Minerals (PRESERVISION AREDS 2 PO) Take 1 capsule by mouth 2 (two) times daily.  . pantoprazole (PROTONIX) 40 MG tablet Take 1 tablet (40 mg total) by mouth daily.  . polyethylene glycol (MIRALAX / GLYCOLAX) packet Take 17-25.5 g by mouth daily.   . QUEtiapine (SEROQUEL) 50 MG tablet Take 50 mg by mouth at bedtime.   . rosuvastatin (CRESTOR) 40 MG tablet Take 20 mg by mouth every evening.  . tamsulosin (FLOMAX) 0.4 MG CAPS capsule Take 1 capsule (0.4 mg total) by mouth daily after supper.  . traZODone (DESYREL) 50 MG tablet Take 50 mg by mouth at bedtime.  . Blood Glucose Monitoring Suppl (FREESTYLE FREEDOM LITE) w/Device KIT Use glucometer to check sugars 4 times daily. Dx E11.9  . glucose blood (FREESTYLE LITE) test strip Use as instructed to check sugars 4 times daily. Dx E11.9  . Lancets (FREESTYLE) lancets Use as instructed to check sugars 4 times daily. Dx E11.9    FAMILY HISTORY:  His indicated that his mother is deceased. He indicated that his father is deceased. He indicated that the status of his neg hx is unknown.   SOCIAL HISTORY: He  reports that  has never smoked. His smokeless tobacco use includes chew. He reports that he drinks alcohol. He reports that he does not use drugs.  REVIEW OF SYSTEMS:  POSITIVES IN BOLD Gen: Denies fever, chills, weight change, fatigue, night sweats HEENT: Denies blurred vision, double vision, hearing loss, tinnitus, sinus congestion, rhinorrhea, sore throat, neck stiffness, dysphagia PULM: Denies shortness of breath, cough, sputum production, hemoptysis, wheezing CV: Denies chest pain, edema, orthopnea, paroxysmal nocturnal dyspnea, palpitations GI: Denies abdominal pain, nausea, vomiting, diarrhea, hematochezia, melena, constipation, change in bowel habits GU: Denies dysuria, hematuria, polyuria, oliguria, urethral discharge Endocrine: Denies hot or cold intolerance, polyuria, polyphagia or appetite change Derm: Denies rash, dry skin,  scaling or peeling skin change Heme: Denies easy bruising, bleeding, bleeding gums Neuro: Denies headache, numbness, weakness, slurred speech, loss of memory or consciousness   SUBJECTIVE:   VITAL SIGNS: BP (!) 78/62   Pulse 98   Temp (!) 101.3 F (38.5 C) (Rectal)   Resp 16   Ht '5\' 11"'  (1.803 m)   Wt 260 lb (117.9 kg)   SpO2 100%   BMI 36.26 kg/m   HEMODYNAMICS:    VENTILATOR SETTINGS:    INTAKE / OUTPUT: No intake/output data recorded.  PHYSICAL EXAMINATION: General: elderly male in NAD, lying on ER stretcher  HEENT: MM pink/moist, hearing aides in place Neuro: AAOx4, speech clear, MAE  CV: s1s2 rrr, no m/r/g PULM: even/non-labored, lungs bilaterally clear  PJ:ASNK, non-tender, bsx4 active  Extremities: warm/dry, 1-2+ BLE pitting edema  Skin: no rashes or lesions, multiple tattoos   LABS:  BMET Recent Labs  Lab 09/15/17 0650 09/16/17 0742 09/19/17 0430  NA 139 140 134*  K 3.9 4.9 4.6  CL 101 104 99*  CO2 '29 30 26  ' BUN 73* 49* 46*  CREATININE 1.94* 1.30* 2.90*  GLUCOSE 134* 93 153*    Electrolytes Recent Labs  Lab  09/15/17 0650 09/16/17 0742 09/19/17 0430  CALCIUM 9.0 9.1 9.1  MG  --  2.1  --     CBC Recent Labs  Lab 09/14/17 2110 09/16/17 0742 09/19/17 0430  WBC 15.5* 10.1 32.1*  HGB 13.3 12.9* 12.3*  HCT 39.9 39.8 37.4*  PLT 266 259 213    Coag's Recent Labs  Lab 09/19/17 0430  INR 1.12    Sepsis Markers Recent Labs  Lab 09/19/17 0447  LATICACIDVEN 1.08    ABG No results for input(s): PHART, PCO2ART, PO2ART in the last 168 hours.  Liver Enzymes Recent Labs  Lab 09/14/17 2110 09/15/17 0650 09/19/17 0430  AST '17 16 17  ' ALT 14* 14* 15*  ALKPHOS 79 76 73  BILITOT 0.5 0.7 1.0  ALBUMIN 3.7 3.5 3.5    Cardiac Enzymes No results for input(s): TROPONINI, PROBNP in the last 168 hours.  Glucose Recent Labs  Lab 09/15/17 0731 09/15/17 1158 09/15/17 1728 09/15/17 2124 09/16/17 0722 09/16/17 1119  GLUCAP 122*  138* 186* 156* 92 149*    Imaging Dg Chest 2 View  Result Date: 09/19/2017 CLINICAL DATA:  Unexplained weight gain. Urinary retention. Hypotension. EXAM: CHEST  2 VIEW COMPARISON:  09/14/2017 FINDINGS: Shallow inspiration with elevation of right hemidiaphragm and linear atelectasis in the lung bases. No focal consolidation. No blunting of costophrenic angles. No pneumothorax. Heart size and pulmonary vascularity are normal. No edema. IMPRESSION: Shallow inspiration with linear atelectasis in the lung bases. Electronically Signed   By: Lucienne Capers M.D.   On: 09/19/2017 06:05   Ct Renal Stone Study  Result Date: 09/19/2017 CLINICAL DATA:  Urinary tract infection, hypotension. Leukocytosis. Constipation. Urinary retention. EXAM: CT ABDOMEN AND PELVIS WITHOUT CONTRAST TECHNIQUE: Multidetector CT imaging of the abdomen and pelvis was performed following the standard protocol without IV contrast. COMPARISON:  Multiple exams, including 12/17/2016 and ultrasound of 09/15/2017 FINDINGS: Lower chest: Chronic right lower lobe peribronchovascular volume loss. Coronary and descending thoracic aortic atherosclerotic calcification. Hepatobiliary: Punctate calcifications in the liver are compatible with old granulomatous disease. Cholecystectomy. Pancreas: Unremarkable Spleen: Unremarkable Adrenals/Urinary Tract: Adrenal glands normal. 1.8 by 1.4 cm exophytic lesion of the left kidney lower pole, internal density 0 Hounsfield units, favoring cyst. Bilateral perirenal stranding appears chronic. Small caliber urinary bladder contains a Foley catheter. There is urinary bladder wall thickening along with scattered loculations of gas density within the urinary bladder. The scattering of gas loculations is somewhat atypical and could be secondary to infection but I am skeptical that the gas is actually within the bladder wall. Perivesical stranding. Possible small posterior bladder roof diverticulum similar to prior.  Stomach/Bowel: Scattered sigmoid colon diverticula without diverticulitis. Appendix normal. Upper normal amount of colonic stool. Vascular/Lymphatic: Aortoiliac atherosclerotic vascular disease. Small pelvic and retroperitoneal lymph nodes are not pathologically enlarged by size criteria. Reproductive: Subtle periprostatic stranding but similar to prior. Other: Faint stranding in the central mesentery for example on image 48/2, slightly increased from prior. Musculoskeletal: Mild male pelvic floor laxity. Remote superior endplate compression at L2. Congenitally short pedicles in the lumbar spine with spondylosis and degenerative disc disease contributing to impingement at L2-3, L3-4, and L4-5. Postoperative findings in the lower lumbar spine. Suspected gas within a synovial cyst posteriorly at L2-contributing to the central narrowing of the thecal sac at this level. IMPRESSION: 1. Chronic wall thickening and perivesical stranding in the urinary bladder with a Foley catheter in place. Appearance likely reflects chronic cystitis. Scattered locules of gas within the contents of the urinary bladder. Questionable  mild chronic prostatitis with faint periprostatic stranding. 2. There is some faint stranding in the central mesentery which is nonspecific. No nodularity. 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 3. Upper normal amount of colonic stool. 4. Other imaging findings of potential clinical significance: Chronic atelectasis in the right lower lobe. Aortic Atherosclerosis (ICD10-I70.0). Coronary atherosclerosis. Sigmoid diverticulosis. Suspected mild male pelvic floor laxity. Lumbar impingement at L2-, L3-4, and L4-5. Electronically Signed   By: Van Clines M.D.   On: 09/19/2017 08:00     STUDIES:    CULTURES: UA 1/22 >> trace ketones, nitrite neg, mod leukocytes, many bacteria, 6-30 WBC BCx2 1/22 >>   ANTIBIOTICS: Vanco x1 1/22  Rocephin 1/22 >>   SIGNIFICANT  EVENTS: 1/22 Admit with suspected urosepsis  LINES/TUBES: Foley (placed during 1/17 admit) >>   DISCUSSION: 84 y/o M admitted 1/22 with decreased UOP, dysuria   ASSESSMENT / PLAN:  PULMONARY A: Hx OSA - does not use CPAP Hx PE P:   Pulmonary hygiene   CARDIOVASCULAR A:  Hx HTN P:  Hold home coreg LR @ 100 ml/hr ICU monitoring  Neosynephrine PIV for hypotension   RENAL A:   Hyponatremia  AKI  Recent Urinary Retention - s/p foley catheter placement, was to see Urology in am 1/23. P:   Trend BMP / urinary output Replace electrolytes as indicated Avoid nephrotoxic agents, ensure adequate renal perfusion Urology consulted, appreciate input  See ID  ?? If Galantamine could be causing his retention / also has to be renally dosed  GASTROINTESTINAL A:   Hx GERD, Constipation, Cholecystectomy P:   Carb modified diet  HEMATOLOGIC A:   Leukocytosis Anemia - mild  P:  Trend CBC Heparin for DVT prophylaxis   INFECTIOUS A:   UTI  Septic Shock  P:   Follow cultures Rocephin as above  Hold further vanco for now  Defer foley catheter to Urology   ENDOCRINE A:   DM  P:   SSI  Hold home lantus for 1/22  NEUROLOGIC A:   Hx Arthritis, Chronic Pain, PTSD P:   RASS goal: n/a Hold home dilaudid PRN percocet for pain    FAMILY  - Updates: Wife and patient updated at bedside am 1/22.   - Inter-disciplinary family meet or Palliative Care meeting due by: 1/29   Noe Gens, NP-C [place] Pgr: [phone] or if no answer [phone] 09/19/2017, 10:00 AM

## 2017-09-19 NOTE — Telephone Encounter (Signed)
Copied from [place]. Topic: Quick Communication - See Telephone Encounter >> Sep 19, 2017  8:39 AM Hewitt Shorts wrote: CRM for notification. See Telephone encounter for: pt wife called stating that patient has been admitted to the hospital and she believes that he has an appt with a urologist that the office referred him and she would like the office to cancel for her appt since she is not sure how long patient will be in hospital Best number   09/19/17.

## 2017-09-19 NOTE — ED Notes (Signed)
Bed: [facility] Expected date:  Expected time:  Means of arrival:  Comments: EMS 84 yo male urinary retention and constipation/hypotension

## 2017-09-19 NOTE — ED Notes (Signed)
MD notified of blood pressure.  

## 2017-09-20 ENCOUNTER — Inpatient Hospital Stay: Payer: Medicare Other | Admitting: Family Medicine

## 2017-09-20 DIAGNOSIS — N179 Acute kidney failure, unspecified: Secondary | ICD-10-CM

## 2017-09-20 DIAGNOSIS — N410 Acute prostatitis: Secondary | ICD-10-CM

## 2017-09-20 LAB — CBC
HCT: 36.2 % — ABNORMAL LOW (ref 39.0–52.0)
HEMOGLOBIN: 11.4 g/dL — AB (ref 13.0–17.0)
MCH: 28.4 pg (ref 26.0–34.0)
MCHC: 31.5 g/dL (ref 30.0–36.0)
MCV: 90.3 fL (ref 78.0–100.0)
Platelets: 199 10*3/uL (ref 150–400)
RBC: 4.01 MIL/uL — ABNORMAL LOW (ref 4.22–5.81)
RDW: 13.8 % (ref 11.5–15.5)
WBC: 24.5 10*3/uL — ABNORMAL HIGH (ref 4.0–10.5)

## 2017-09-20 LAB — BASIC METABOLIC PANEL
Anion gap: 6 (ref 5–15)
BUN: 40 mg/dL — AB (ref 6–20)
CALCIUM: 8.4 mg/dL — AB (ref 8.9–10.3)
CHLORIDE: 106 mmol/L (ref 101–111)
CO2: 23 mmol/L (ref 22–32)
CREATININE: 1.81 mg/dL — AB (ref 0.61–1.24)
GFR calc Af Amer: 38 mL/min — ABNORMAL LOW (ref >60)
GFR calc non Af Amer: 33 mL/min — ABNORMAL LOW (ref >60)
Glucose, Bld: 133 mg/dL — ABNORMAL HIGH (ref 65–99)
Potassium: 4.3 mmol/L (ref 3.5–5.1)
SODIUM: 135 mmol/L (ref 135–145)

## 2017-09-20 LAB — GLUCOSE, CAPILLARY
GLUCOSE-CAPILLARY: 122 mg/dL — AB (ref 65–99)
GLUCOSE-CAPILLARY: 114 mg/dL — AB (ref 65–99)
GLUCOSE-CAPILLARY: 114 mg/dL — AB (ref 65–99)
Glucose-Capillary: 156 mg/dL — ABNORMAL HIGH (ref 65–99)

## 2017-09-20 LAB — MAGNESIUM: MAGNESIUM: 2 mg/dL (ref 1.7–2.4)

## 2017-09-20 LAB — SODIUM, URINE, RANDOM: Sodium, Ur: 39 mmol/L

## 2017-09-20 LAB — CREATININE, URINE, RANDOM: CREATININE, URINE: 75 mg/dL

## 2017-09-20 LAB — PHOSPHORUS: PHOSPHORUS: 2.8 mg/dL (ref 2.5–4.6)

## 2017-09-20 LAB — PROCALCITONIN: Procalcitonin: 0.75 ng/mL

## 2017-09-20 MED ORDER — SODIUM CHLORIDE 0.9 % IV SOLN
2.0000 mg | Freq: Two times a day (BID) | INTRAVENOUS | Status: DC
  Filled 2017-09-20: qty 0

## 2017-09-20 MED ORDER — LIDOCAINE HCL 2 % EX GEL: 1.0000 "application " | Freq: Once | CUTANEOUS | Status: DC

## 2017-09-20 MED ORDER — SODIUM CHLORIDE 0.9 % IV SOLN
2.0000 g | Freq: Two times a day (BID) | INTRAVENOUS | Status: DC
  Administered 2017-09-20 – 2017-09-21 (×2): 2 g via INTRAVENOUS
  Filled 2017-09-20 (×4): qty 2

## 2017-09-20 MED ORDER — URELLE 81 MG PO TABS
1.0000 | ORAL_TABLET | Freq: Four times a day (QID) | ORAL | Status: DC
  Administered 2017-09-20 – 2017-09-21 (×5): 81 mg via ORAL
  Filled 2017-09-20 (×10): qty 1

## 2017-09-20 MED ORDER — LACTATED RINGERS IV BOLUS (SEPSIS)
1000.0000 mL | Freq: Once | INTRAVENOUS | Status: AC
  Administered 2017-09-20: 1000 mL via INTRAVENOUS

## 2017-09-20 NOTE — Progress Notes (Signed)
Pharmacy Antibiotic Note  [name] is a 84 y.o. male admitted on 09/19/2017 with urinary retention.  Pharmacy initially consulted for ceftriaxone dosing.  Today, 09/20/2017 Broadening antibiotic coverage to Meropenem Afebrile today WBC 24.5, improved SCr 1.81, improved PCT 1.02 > 0.75  Plan: Meropenem 2g (due to weight) IV q12h for CrCl 30-50 ml/min Follow up renal function & cultures  Height: 5\' 11"  (180.3 cm) Weight: (!) 300 lb 4.3 oz (136.2 kg) IBW/kg (Calculated) : 75.3  Temp (24hrs), Avg:98.9 F (37.2 C), Min:97.4 F (36.3 C), Max:100.8 F (38.2 C)  Recent Labs  Lab 09/14/17 2110 09/15/17 0650 09/16/17 0742 09/19/17 0430 09/19/17 0447 09/20/17 0302  WBC 15.5*  --  10.1 32.1*  --  24.5*  CREATININE 2.07* 1.94* 1.30* 2.90*  --  1.81*  LATICACIDVEN  --   --   --   --  1.08  --     Estimated Creatinine Clearance: 43.6 mL/min (A) (by C-G formula based on SCr of 1.81 mg/dL (H)).    Allergies  Allergen Reactions  . Oxycontin [Oxycodone Hcl] Other (See Comments)    Pt states he cant sleep w/ this med; also makes him constipated.     Antimicrobials this admission: 1/22 Vancomycin x 1 1/22 Ceftriaxone >>  Dose adjustments this admission: none  Microbiology results: 1/22 MRSA PCR: neg 1/22 BCx: ngtd 1/22 UCx: >100k Klebsiella pneumonia (sensitivities pending)  Thank you for allowing pharmacy to be a part of this patient's care.  Peggyann Juba, PharmD, BCPS Pager: [phone] 09/20/2017 11:19 AM

## 2017-09-20 NOTE — Progress Notes (Signed)
PULMONARY / CRITICAL CARE MEDICINE   Name: [name] MRN: [id number] DOB: 07-29-1932    ADMISSION DATE:  09/19/2017 CONSULTATION DATE:  09/19/17  REFERRING MD:  Dr. Kathrynn Humble   CHIEF COMPLAINT:  Urinary retention  HISTORY OF PRESENT ILLNESS:   84 y/o M with recent admit from 1/17 - 1/19 per [facility] for urinary retention / oliguria and AKI.  He was discharged home with plans to follow up with Urology as an outpatient (with foley catheter in place).  The patients wife recorded his UOP and he had regular output on 1/19, 1/20.  However it slowed the am of 1/21 and he began having complaints of difficulty voiding / pain with urination.  He also reported constipation and weakness.  He went to the restroom this am and was too weak to get up.  EMS was called and he was found to be hypotensive.  The patient was given IVF's with some improvement in blood pressure.  In ER, he continued to have low BP readings.  He was given 2500 ml NS.  Urine was assessed with concerns for UTI.  He was treated with IV vancomycin + rocephin in the ER.  Despite IVF, his blood pressure did not improve.  Lactic acid was negative and patient's mental status remained intact.  Labs concerning for AKI.    PCCM called for ICU admit.    SUBJECTIVE: Pt complains urinary burning.  Remains on 60 mcg's neosynephrine.   VITAL SIGNS: BP (!) 86/63   Pulse 79   Temp 98.2 F (36.8 C) (Axillary)   Resp (!) 22   Ht 5\' 11"  (1.803 m)   Wt (!) 300 lb 4.3 oz (136.2 kg)   SpO2 94%   BMI 41.88 kg/m   HEMODYNAMICS:    VENTILATOR SETTINGS:    INTAKE / OUTPUT: I/O last 3 completed shifts: In: 6687.7 [P.O.:125; I.V.:3562.7; IV Piggyback:3000] Out: 2600 [Urine:2600]  PHYSICAL EXAMINATION: General: elderly male in NAD, lying in bed HEENT: MM pink/moist Neuro: AAOx4, speech clear, MAE  CV: s1s2 rrr, no m/r/g PULM: even/non-labored, lungs bilaterally clear  TI:RWER, non-tender, bsx4 active  Extremities: warm/dry, BLE 1-2+ pitting  edema (unchanged) Skin: no rashes or lesions, multiple tattoos  LABS:  BMET Recent Labs  Lab 09/16/17 0742 09/19/17 0430 09/20/17 0302  NA 140 134* 135  K 4.9 4.6 4.3  CL 104 99* 106  CO2 30 26 23   BUN 49* 46* 40*  CREATININE 1.30* 2.90* 1.81*  GLUCOSE 93 153* 133*    Electrolytes Recent Labs  Lab 09/16/17 0742 09/19/17 0430 09/20/17 0302  CALCIUM 9.1 9.1 8.4*  MG 2.1  --  2.0  PHOS  --   --  2.8    CBC Recent Labs  Lab 09/16/17 0742 09/19/17 0430 09/20/17 0302  WBC 10.1 32.1* 24.5*  HGB 12.9* 12.3* 11.4*  HCT 39.8 37.4* 36.2*  PLT 259 213 199    Coag's Recent Labs  Lab 09/19/17 0430  INR 1.12    Sepsis Markers Recent Labs  Lab 09/19/17 0429 09/19/17 0447 09/20/17 0302  LATICACIDVEN  --  1.08  --   PROCALCITON 1.02  --  0.75    ABG No results for input(s): PHART, PCO2ART, PO2ART in the last 168 hours.  Liver Enzymes Recent Labs  Lab 09/14/17 2110 09/15/17 0650 09/19/17 0430  AST 17 16 17   ALT 14* 14* 15*  ALKPHOS 79 76 73  BILITOT 0.5 0.7 1.0  ALBUMIN 3.7 3.5 3.5    Cardiac Enzymes No  results for input(s): TROPONINI, PROBNP in the last 168 hours.  Glucose Recent Labs  Lab 09/16/17 0722 09/16/17 1119 09/19/17 1150 09/19/17 1657 09/19/17 2147 09/20/17 0747  GLUCAP 92 149* 125* 178* 113* 156*    Imaging No results found.   STUDIES:    CULTURES: UA 1/22 >> trace ketones, nitrite neg, mod leukocytes, many bacteria, 6-30 WBC BCx2 1/22 >>   ANTIBIOTICS: Vanco x1 1/22  Rocephin 1/22 >>   SIGNIFICANT EVENTS: 1/22 Admit with suspected urosepsis  LINES/TUBES: Foley (placed during 1/17 admit) >>   DISCUSSION: 84 y/o M admitted 1/22 with decreased UOP, dysuria   ASSESSMENT / PLAN:  PULMONARY A: Hx OSA - does not use CPAP Hx PE P:   Pulmonary hygiene - IS  CARDIOVASCULAR A:  Hx HTN P:  Hold home coreg LR bolus x1L LR at 100 ml/hr  ICU monitoring  Wean neo to off for MAP >65  RENAL A:    Hyponatremia  AKI  Recent Urinary Retention - s/p foley catheter placement, was to see Urology in am 1/23. P:   Assess FeNA Trial of U[place] for dysuria  Trend BMP / urinary output Replace electrolytes as indicated Avoid nephrotoxic agents, ensure adequate renal perfusion Appreciate Urology > rec's for foley for 7-10 days, 4 weeks abx ?? If home Galantamine could be source of retention / also should be renally dosed   GASTROINTESTINAL A:   Hx GERD, Constipation, Cholecystectomy P:   Carb modified diet as tolerated   HEMATOLOGIC A:   Leukocytosis Anemia - mild  P:  Trend CBC  Heparin for DVT prophylaxis   INFECTIOUS A:   GNR UTI  Septic Shock  P:   Follow cultures Broaden to meropenem with GNR, remaining on pressors 24 hours See urology rec's above   ENDOCRINE A:   DM  P:   SSI  Hold home lantus   NEUROLOGIC A:   Hx Arthritis, Chronic Pain, PTSD P:   Hold home dilaudid due to hypotension  PRN percocet for pain    FAMILY  - Updates: Wife and patient updated at bedside 1/23.    - Inter-disciplinary family meet or Palliative Care meeting due by: 1/29   Noe Gens, NP-C [place] [facility] Pgr: [phone] or if no answer [phone] 09/20/2017, 9:45 AM

## 2017-09-20 NOTE — Progress Notes (Signed)
Date: September 20, 2017 Velva Harman, BSN, [place], [place] Chart and notes review for patient progress and needs./ Transferred to icu due to septic shock and urinary retention. Will follow for case management and discharge needs. No cm or discharge needs present at time of this review. Next review date: [id number]

## 2017-09-21 LAB — GLUCOSE, CAPILLARY
GLUCOSE-CAPILLARY: 115 mg/dL — AB (ref 65–99)
Glucose-Capillary: 143 mg/dL — ABNORMAL HIGH (ref 65–99)
Glucose-Capillary: 117 mg/dL — ABNORMAL HIGH (ref 65–99)

## 2017-09-21 LAB — URINE CULTURE

## 2017-09-21 LAB — BASIC METABOLIC PANEL
ANION GAP: 7 (ref 5–15)
BUN: 29 mg/dL — ABNORMAL HIGH (ref 6–20)
CHLORIDE: 108 mmol/L (ref 101–111)
CO2: 24 mmol/L (ref 22–32)
CREATININE: 1.16 mg/dL (ref 0.61–1.24)
Calcium: 8.4 mg/dL — ABNORMAL LOW (ref 8.9–10.3)
GFR calc non Af Amer: 56 mL/min — ABNORMAL LOW (ref >60)
GLUCOSE: 109 mg/dL — AB (ref 65–99)
Potassium: 4.3 mmol/L (ref 3.5–5.1)
Sodium: 139 mmol/L (ref 135–145)

## 2017-09-21 LAB — PROCALCITONIN: Procalcitonin: 0.33 ng/mL

## 2017-09-21 LAB — CORTISOL: CORTISOL PLASMA: 7.4 ug/dL

## 2017-09-21 MED ORDER — CIPROFLOXACIN HCL 500 MG PO TABS
500.0000 mg | ORAL_TABLET | Freq: Two times a day (BID) | ORAL | Status: DC
  Administered 2017-09-21 – 2017-09-22 (×3): 500 mg via ORAL
  Filled 2017-09-21 (×4): qty 1

## 2017-09-21 MED ORDER — URELLE 81 MG PO TABS
1.0000 | ORAL_TABLET | Freq: Four times a day (QID) | ORAL | Status: DC
  Administered 2017-09-22 (×3): 81 mg via ORAL
  Filled 2017-09-21 (×4): qty 1

## 2017-09-21 MED ORDER — HYDROMORPHONE HCL 2 MG PO TABS
1.0000 mg | ORAL_TABLET | Freq: Two times a day (BID) | ORAL | Status: DC | PRN
  Filled 2017-09-21: qty 1

## 2017-09-21 NOTE — Progress Notes (Addendum)
84 year old man with indwelling Foley after recent admission 1/17-1/19 for urinary retention and AK I comes back with difficulty voiding and generalized weakness, hypotension and fever 101.   He is off pressors this morning and complains of pain in his knee and feet.  Asks for his pain medications back, Tylenol did not help yesterday. Otherwise denies pain while voiding, urine is colored green on his Foley. Soft and nontender abdomen, afebrile.  Labs show low pro-calcitonin, creatinine had improved to 1.8.  Impression/plan  Septic shock-resolved, off pressors, can discontinue IV fluids  Favor acute prostatitis over Nosocomial UTI with indwelling Foley  -urine growing Klebsiella, await cultures,-CT abdomen suggestive of cystitis did not show any evidence of obstructive uropathy - change to oral Cipro And continue for 4 weeks per urology recommendation  Osteoarthritis-can resume  Dilaudid at lower dose home medication-  AKI -Repeat creatinine today. He can be transferred to the floor and to triad 1/25  Kara Mead MD. [facility]. [place] [facility] Pager [phone] If no response call [phone]   09/21/2017

## 2017-09-22 DIAGNOSIS — R6520 Severe sepsis without septic shock: Secondary | ICD-10-CM

## 2017-09-22 LAB — GLUCOSE, CAPILLARY
GLUCOSE-CAPILLARY: 127 mg/dL — AB (ref 65–99)
GLUCOSE-CAPILLARY: 173 mg/dL — AB (ref 65–99)
Glucose-Capillary: 130 mg/dL — ABNORMAL HIGH (ref 65–99)

## 2017-09-22 LAB — CBC WITH DIFFERENTIAL/PLATELET
Basophils Absolute: 0 10*3/uL (ref 0.0–0.1)
Basophils Relative: 0 %
EOS ABS: 0.2 10*3/uL (ref 0.0–0.7)
EOS PCT: 2 %
HCT: 35.8 % — ABNORMAL LOW (ref 39.0–52.0)
HEMOGLOBIN: 12 g/dL — AB (ref 13.0–17.0)
LYMPHS ABS: 1.6 10*3/uL (ref 0.7–4.0)
Lymphocytes Relative: 20 %
MCH: 29.3 pg (ref 26.0–34.0)
MCHC: 33.5 g/dL (ref 30.0–36.0)
MCV: 87.5 fL (ref 78.0–100.0)
MONO ABS: 1.3 10*3/uL — AB (ref 0.1–1.0)
Monocytes Relative: 16 %
NEUTROS PCT: 62 %
Neutro Abs: 4.8 10*3/uL (ref 1.7–7.7)
PLATELETS: 184 10*3/uL (ref 150–400)
RBC: 4.09 MIL/uL — AB (ref 4.22–5.81)
RDW: 13.6 % (ref 11.5–15.5)
WBC: 7.9 10*3/uL (ref 4.0–10.5)

## 2017-09-22 MED ORDER — CIPROFLOXACIN HCL 500 MG PO TABS: 500.0000 mg | ORAL_TABLET | Freq: Two times a day (BID) | ORAL | 0 refills | Status: AC

## 2017-09-22 NOTE — Care Management Important Message (Signed)
Important Message  Patient Details  Name: [name] MRN: [id number] Date of Birth: 1932-08-06   Medicare Important Message Given:  Yes    Kerin Salen 09/22/2017, 10:42 AMImportant Message  Patient Details  Name: [name] MRN: [id number] Date of Birth: Jun 15, 1932   Medicare Important Message Given:  Yes    Kerin Salen 09/22/2017, 10:42 AM

## 2017-09-22 NOTE — Progress Notes (Signed)
Home health PT ordered for dc. This CM met with pt at bedside and pt politely declines home health services at this time. Pt states that it wouldn't do any good and he has all the equipment that he needs at home.  Marney Doctor RN,BSN,NCM [phone]

## 2017-09-22 NOTE — Discharge Summary (Addendum)
Physician Discharge Summary  [name] UYQ:[id number] DOB: 1931-12-21 DOA: 09/19/2017  PCP: [name], MD  Admit date: 09/19/2017 Discharge date: 09/22/2017  Admitted From: Home Disposition:  [place]: Yes  Discharge Condition:Stable  CODE STATUS: Full Diet recommendation: Heart Healthy   Brief/Interim Summary: Patient is a 84 year old male with  indwelling Foley catheter of recent admission on 117/119 for urinary retention and acute kidney injury who came back to the emergency department with difficulty voiding and generalized weakness.  He also presented with hypotension and fever of 101.  Patient was given IV fluid resuscitation but he remained hypotensive.  Septic shock was suspected secondary to prostatitis. Patient was evaluated by critical care team and he was admitted to ICU with vasopressors. Patient was started on broad-spectrum antibiotic. Patient blood pressure was stabilized.  Fever resolved.  Patient was also evaluated by urology.  His blood cultures remain negative.  His urine culture grew Klebsiella pneumonia which is pansensitive.  Patient has been transitioned to ciprofloxacin. Patient was handed over to the hospitalist team today.  This morning he was comfortable and on his baseline.  His vitals are stable.  Labs looks okay.  He is afebrile. He continues to be on Foley and will follow up with urologist as an outpatient.  As per urologist recommendation we will continue the antibiotics for total of 4 weeks.  He will be discharged home with home health today.   Discharge Diagnoses:  Active Problems:   UTI (urinary tract infection)    Discharge Instructions  Discharge Instructions    Diet - low sodium heart healthy   Complete by:  As directed    Discharge instructions   Complete by:  As directed    1) Follow up with Urology as soon as possible. Name and number of the provider has been attached. 2) Follow up with your PCP in a week. 3) Take  prescribed medications as instructed.   Increase activity slowly   Complete by:  As directed      Allergies as of 09/22/2017      Reactions   Oxycontin [oxycodone Hcl] Other (See Comments)   Pt states he cant sleep w/ this med; also makes him constipated.       Medication List    TAKE these medications   carvedilol 3.125 MG tablet Commonly known as:  COREG TAKE 1 TABLET TWICE A DAY WITH MEALS   ciprofloxacin 500 MG tablet Commonly known as:  CIPRO Take 1 tablet (500 mg total) by mouth 2 (two) times daily for 24 days.   cyanocobalamin 500 MCG tablet Take 500 mcg by mouth 2 (two) times daily.   docusate sodium 100 MG capsule Commonly known as:  COLACE Take 1 capsule (100 mg total) by mouth 2 (two) times daily.   FREESTYLE FREEDOM LITE w/Device Kit Use glucometer to check sugars 4 times daily. Dx E11.9   freestyle lancets Use as instructed to check sugars 4 times daily. Dx E11.9   galantamine 16 MG 24 hr capsule Commonly known as:  RAZADYNE ER Take 16 mg by mouth daily with breakfast.   glucose blood test strip Commonly known as:  FREESTYLE LITE Use as instructed to check sugars 4 times daily. Dx E11.9   HUMALOG 100 UNIT/ML injection Generic drug:  insulin lispro Inject 0-5 Units into the skin 3 (three) times daily before meals. 0-200 = 0 units, 201-250 = 2 units, 251-300 = 3 units, 301-350 = 4 units, 351-400 = 5 units, >401  call MD/NP   HYDROmorphone 2 MG tablet Commonly known as:  DILAUDID Take 1 tablet (2 mg total) by mouth 2 (two) times daily as needed for severe pain.   insulin glargine 100 UNIT/ML injection Commonly known as:  LANTUS Inject 20 Units into the skin at bedtime.   lactose free nutrition Liqd Take 237 mLs by mouth daily as needed (nutrition).   magnesium oxide 400 MG tablet Commonly known as:  MAG-OX Take 400 mg by mouth 2 (two) times daily.   Melatonin 5 MG Tabs Take 5 mg by mouth at bedtime.   methocarbamol 750 MG tablet Commonly known  as:  ROBAXIN Take 1 tablet (750 mg total) by mouth every 6 (six) hours as needed for muscle spasms.   pantoprazole 40 MG tablet Commonly known as:  PROTONIX Take 1 tablet (40 mg total) by mouth daily.   polyethylene glycol packet Commonly known as:  MIRALAX / GLYCOLAX Take 17-25.5 g by mouth daily.   PRESERVISION AREDS 2 PO Take 1 capsule by mouth 2 (two) times daily.   QUEtiapine 50 MG tablet Commonly known as:  SEROQUEL Take 50 mg by mouth at bedtime.   rosuvastatin 40 MG tablet Commonly known as:  CRESTOR Take 20 mg by mouth every evening.   tamsulosin 0.4 MG Caps capsule Commonly known as:  FLOMAX Take 1 capsule (0.4 mg total) by mouth daily after supper.   THERATEARS OP Apply 1 drop to eye 3 (three) times daily as needed (dry eyes).   traZODone 50 MG tablet Commonly known as:  DESYREL Take 50 mg by mouth at bedtime.   Vitamin D3 2000 units capsule Take 2,000 Units by mouth 2 (two) times daily.      Follow-up Information    [name], MD. Schedule an appointment as soon as possible for a visit in 1 week(s).   Specialty:  Family Medicine Contact information: [address] [place] [address] [phone]        Ardis Hughs, MD. Schedule an appointment as soon as possible for a visit in 5 day(s).   Specialty:  Urology Contact information: [address] [place] [address] [phone]          Allergies  Allergen Reactions  . Oxycontin [Oxycodone Hcl] Other (See Comments)    Pt states he cant sleep w/ this med; also makes him constipated.     Consultations:  Urology, PCCM   Procedures/Studies: Dg Chest 2 View  Result Date: 09/19/2017 CLINICAL DATA:  Unexplained weight gain. Urinary retention. Hypotension. EXAM: CHEST  2 VIEW COMPARISON:  09/14/2017 FINDINGS: Shallow inspiration with elevation of right hemidiaphragm and linear atelectasis in the lung bases. No focal consolidation. No blunting of costophrenic angles.  No pneumothorax. Heart size and pulmonary vascularity are normal. No edema. IMPRESSION: Shallow inspiration with linear atelectasis in the lung bases. Electronically Signed   By: Lucienne Capers M.D.   On: 09/19/2017 06:05   Dg Chest 2 View  Result Date: 09/14/2017 CLINICAL DATA:  84 year old male with chest pain. EXAM: CHEST  2 VIEW COMPARISON:  Chest radiograph dated 09/12/2017 FINDINGS: The heart size and mediastinal contours are within normal limits. Both lungs are clear. The visualized skeletal structures are unremarkable. IMPRESSION: No active cardiopulmonary disease. Electronically Signed   By: Anner Crete M.D.   On: 09/14/2017 22:12   Dg Chest 2 View  Result Date: 09/12/2017 CLINICAL DATA:  Bilateral lower extremity swelling. EXAM: CHEST  2 VIEW COMPARISON:  Radiograph  of December 18, 2016. FINDINGS: Stable cardiomediastinal silhouette. Both lungs are clear. No pneumothorax or pleural effusion is noted. The visualized skeletal structures are unremarkable. IMPRESSION: No active cardiopulmonary disease. Electronically Signed   By: Marijo Conception, M.D.   On: 09/12/2017 13:56   US Renal  Result Date: 09/15/2017 CLINICAL DATA:  Patient with acute renal injury. EXAM: RENAL / URINARY TRACT ULTRASOUND COMPLETE COMPARISON:  CT abdomen pelvis 12/17/2016. FINDINGS: Right Kidney: Length: 12.5 cm. Echogenicity within normal limits. No mass or hydronephrosis visualized. Left Kidney: Length: 12.2 cm. Echogenicity within normal limits. No mass or hydronephrosis visualized. There is a 1.8 cm cyst. Bladder: Decompressed with Foley catheter. IMPRESSION: No hydronephrosis. Electronically Signed   By: Lovey Newcomer M.D.   On: 09/15/2017 08:52   Ct Renal Stone Study  Result Date: 09/19/2017 CLINICAL DATA:  Urinary tract infection, hypotension. Leukocytosis. Constipation. Urinary retention. EXAM: CT ABDOMEN AND PELVIS WITHOUT CONTRAST TECHNIQUE: Multidetector CT imaging of the abdomen and pelvis was performed  following the standard protocol without IV contrast. COMPARISON:  Multiple exams, including 12/17/2016 and ultrasound of 09/15/2017 FINDINGS: Lower chest: Chronic right lower lobe peribronchovascular volume loss. Coronary and descending thoracic aortic atherosclerotic calcification. Hepatobiliary: Punctate calcifications in the liver are compatible with old granulomatous disease. Cholecystectomy. Pancreas: Unremarkable Spleen: Unremarkable Adrenals/Urinary Tract: Adrenal glands normal. 1.8 by 1.4 cm exophytic lesion of the left kidney lower pole, internal density 0 Hounsfield units, favoring cyst. Bilateral perirenal stranding appears chronic. Small caliber urinary bladder contains a Foley catheter. There is urinary bladder wall thickening along with scattered loculations of gas density within the urinary bladder. The scattering of gas loculations is somewhat atypical and could be secondary to infection but I am skeptical that the gas is actually within the bladder wall. Perivesical stranding. Possible small posterior bladder roof diverticulum similar to prior. Stomach/Bowel: Scattered sigmoid colon diverticula without diverticulitis. Appendix normal. Upper normal amount of colonic stool. Vascular/Lymphatic: Aortoiliac atherosclerotic vascular disease. Small pelvic and retroperitoneal lymph nodes are not pathologically enlarged by size criteria. Reproductive: Subtle periprostatic stranding but similar to prior. Other: Faint stranding in the central mesentery for example on image 48/2, slightly increased from prior. Musculoskeletal: Mild male pelvic floor laxity. Remote superior endplate compression at L2. Congenitally short pedicles in the lumbar spine with spondylosis and degenerative disc disease contributing to impingement at L2-3, L3-4, and L4-5. Postoperative findings in the lower lumbar spine. Suspected gas within a synovial cyst posteriorly at L2-contributing to the central narrowing of the thecal sac at this  level. IMPRESSION: 1. Chronic wall thickening and perivesical stranding in the urinary bladder with a Foley catheter in place. Appearance likely reflects chronic cystitis. Scattered locules of gas within the contents of the urinary bladder. Questionable mild chronic prostatitis with faint periprostatic stranding. 2. There is some faint stranding in the central mesentery which is nonspecific. No nodularity. 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 3. Upper normal amount of colonic stool. 4. Other imaging findings of potential clinical significance: Chronic atelectasis in the right lower lobe. Aortic Atherosclerosis (ICD10-I70.0). Coronary atherosclerosis. Sigmoid diverticulosis. Suspected mild male pelvic floor laxity. Lumbar impingement at L2-, L3-4, and L4-5. Electronically Signed   By: Van Clines M.D.   On: 09/19/2017 08:00    (Echo, Carotid, EGD, Colonoscopy, ERCP)    Subjective: Patient was seen and  Examined at the bed side. He was found to be comfortable.  Blood pressure stable.  He is afebrile.  Patient willing to go home.   Discharge  Exam: Vitals:   09/21/17 2133 09/22/17 0443  BP: (!) 159/58 (!) 150/79  Pulse: 81 77  Resp: 14 16  Temp: 98.8 F (37.1 C) 98.8 F (37.1 C)  SpO2: 94% 97%   Vitals:   09/21/17 1200 09/21/17 1402 09/21/17 2133 09/22/17 0443  BP:  (!) 146/99 (!) 159/58 (!) 150/79  Pulse:  83 81 77  Resp:  '16 14 16  ' Temp: 99.3 F (37.4 C) 98.7 F (37.1 C) 98.8 F (37.1 C) 98.8 F (37.1 C)  TempSrc: Oral Oral Oral Oral  SpO2:  92% 94% 97%  Weight:    132 kg (291 lb)  Height:        General: Pt is alert, awake, not in acute distress Cardiovascular: RRR, S1/S2 +, no rubs, no gallops Respiratory: CTA bilaterally, no wheezing, no rhonchi Abdominal: Soft, NT, ND, bowel sounds + Extremities: no edema, no cyanosis, tenderness bilateral knees due to osteoarthritis GU: Foley    The results of significant diagnostics from this  hospitalization (including imaging, microbiology, ancillary and laboratory) are listed below for reference.     Microbiology: Recent Results (from the past 240 hour(s))  Culture, blood (Routine x 2)     Status: None (Preliminary result)   Collection Time: 09/19/17  4:30 AM  Result Value Ref Range Status   Specimen Description BLOOD RIGHT ANTECUBITAL  Final   Special Requests   Final    BOTTLES DRAWN AEROBIC AND ANAEROBIC Blood Culture adequate volume   Culture   Final    NO GROWTH 3 DAYS Performed at [facility], [address] [place], [place] [address]    Report Status PENDING  Incomplete  Urine culture     Status: Abnormal   Collection Time: 09/19/17  4:35 AM  Result Value Ref Range Status   Specimen Description URINE, CATHETERIZED  Final   Special Requests NONE  Final   Culture >=100,000 COLONIES/mL KLEBSIELLA PNEUMONIAE (A)  Final   Report Status 09/21/2017 FINAL  Final   Organism ID, Bacteria KLEBSIELLA PNEUMONIAE (A)  Final      Susceptibility   Klebsiella pneumoniae - MIC*    AMPICILLIN >=32 RESISTANT Resistant     CEFAZOLIN <=4 SENSITIVE Sensitive     CEFTRIAXONE <=1 SENSITIVE Sensitive     CIPROFLOXACIN <=0.25 SENSITIVE Sensitive     GENTAMICIN <=1 SENSITIVE Sensitive     IMIPENEM <=0.25 SENSITIVE Sensitive     NITROFURANTOIN 64 INTERMEDIATE Intermediate     TRIMETH/SULFA <=20 SENSITIVE Sensitive     AMPICILLIN/SULBACTAM 8 SENSITIVE Sensitive     PIP/TAZO <=4 SENSITIVE Sensitive     Extended ESBL NEGATIVE Sensitive     * >=100,000 COLONIES/mL KLEBSIELLA PNEUMONIAE  Culture, blood (Routine x 2)     Status: None (Preliminary result)   Collection Time: 09/19/17  4:57 AM  Result Value Ref Range Status   Specimen Description BLOOD LEFT HAND  Final   Special Requests   Final    BOTTLES DRAWN AEROBIC AND ANAEROBIC Blood Culture adequate volume   Culture   Final    NO GROWTH 3 DAYS Performed at [facility], [address], [place], [place] [address]     Report Status PENDING  Incomplete  MRSA PCR Screening     Status: None   Collection Time: 09/19/17 11:28 AM  Result Value Ref Range Status   MRSA by PCR NEGATIVE NEGATIVE Final    Comment:        The GeneXpert MRSA Assay (FDA approved  for NASAL specimens only), is one component of a comprehensive MRSA colonization surveillance program. It is not intended to diagnose MRSA infection nor to guide or monitor treatment for MRSA infections.      Labs: BNP (last 3 results) Recent Labs    09/14/17 2111 09/19/17 0444  BNP 26.7 68.0   Basic Metabolic Panel: Recent Labs  Lab 09/16/17 0742 09/19/17 0430 09/20/17 0302 09/21/17 0323  NA 140 134* 135 139  K 4.9 4.6 4.3 4.3  CL 104 99* 106 108  CO2 '30 26 23 24  ' GLUCOSE 93 153* 133* 109*  BUN 49* 46* 40* 29*  CREATININE 1.30* 2.90* 1.81* 1.16  CALCIUM 9.1 9.1 8.4* 8.4*  MG 2.1  --  2.0  --   PHOS  --   --  2.8  --    Liver Function Tests: Recent Labs  Lab 09/19/17 0430  AST 17  ALT 15*  ALKPHOS 73  BILITOT 1.0  PROT 6.8  ALBUMIN 3.5   No results for input(s): LIPASE, AMYLASE in the last 168 hours. No results for input(s): AMMONIA in the last 168 hours. CBC: Recent Labs  Lab 09/16/17 0742 09/19/17 0430 09/20/17 0302 09/22/17 0954  WBC 10.1 32.1* 24.5* 7.9  NEUTROABS 4.9 27.3*  --  4.8  HGB 12.9* 12.3* 11.4* 12.0*  HCT 39.8 37.4* 36.2* 35.8*  MCV 89.4 89.3 90.3 87.5  PLT 259 213 199 184   Cardiac Enzymes: No results for input(s): CKTOTAL, CKMB, CKMBINDEX, TROPONINI in the last 168 hours. BNP: Invalid input(s): POCBNP CBG: Recent Labs  Lab 09/21/17 0735 09/21/17 1148 09/21/17 1735 09/21/17 2120 09/22/17 0817  GLUCAP 115* 143* 117* 130* 127*   D-Dimer No results for input(s): DDIMER in the last 72 hours. Hgb A1c No results for input(s): HGBA1C in the last 72 hours. Lipid Profile No results for input(s): CHOL, HDL, LDLCALC, TRIG, CHOLHDL, LDLDIRECT in the last 72 hours. Thyroid function studies No  results for input(s): TSH, T4TOTAL, T3FREE, THYROIDAB in the last 72 hours.  Invalid input(s): FREET3 Anemia work up No results for input(s): VITAMINB12, FOLATE, FERRITIN, TIBC, IRON, RETICCTPCT in the last 72 hours. Urinalysis    Component Value Date/Time   COLORURINE YELLOW 09/19/2017 0435   APPEARANCEUR CLOUDY (A) 09/19/2017 0435   LABSPEC >1.030 (H) 09/19/2017 0435   PHURINE 6.0 09/19/2017 0435   GLUCOSEU NEGATIVE 09/19/2017 0435   HGBUR NEGATIVE 09/19/2017 0435   BILIRUBINUR NEGATIVE 09/19/2017 0435   BILIRUBINUR negative 12/30/2016 1308   KETONESUR TRACE (A) 09/19/2017 0435   PROTEINUR NEGATIVE 09/19/2017 0435   UROBILINOGEN 0.2 12/30/2016 1308   UROBILINOGEN 1.0 11/22/2012 1633   NITRITE NEGATIVE 09/19/2017 0435   LEUKOCYTESUR MODERATE (A) 09/19/2017 0435   Sepsis Labs Invalid input(s): PROCALCITONIN,  WBC,  LACTICIDVEN Microbiology Recent Results (from the past 240 hour(s))  Culture, blood (Routine x 2)     Status: None (Preliminary result)   Collection Time: 09/19/17  4:30 AM  Result Value Ref Range Status   Specimen Description BLOOD RIGHT ANTECUBITAL  Final   Special Requests   Final    BOTTLES DRAWN AEROBIC AND ANAEROBIC Blood Culture adequate volume   Culture   Final    NO GROWTH 3 DAYS Performed at [facility], [place] [address], [place],  [address]    Report Status PENDING  Incomplete  Urine culture     Status: Abnormal   Collection Time: 09/19/17  4:35 AM  Result Value Ref Range Status   Specimen Description URINE,  CATHETERIZED  Final   Special Requests NONE  Final   Culture >=100,000 COLONIES/mL KLEBSIELLA PNEUMONIAE (A)  Final   Report Status 09/21/2017 FINAL  Final   Organism ID, Bacteria KLEBSIELLA PNEUMONIAE (A)  Final      Susceptibility   Klebsiella pneumoniae - MIC*    AMPICILLIN >=32 RESISTANT Resistant     CEFAZOLIN <=4 SENSITIVE Sensitive     CEFTRIAXONE <=1 SENSITIVE Sensitive     CIPROFLOXACIN <=0.25 SENSITIVE Sensitive      GENTAMICIN <=1 SENSITIVE Sensitive     IMIPENEM <=0.25 SENSITIVE Sensitive     NITROFURANTOIN 64 INTERMEDIATE Intermediate     TRIMETH/SULFA <=20 SENSITIVE Sensitive     AMPICILLIN/SULBACTAM 8 SENSITIVE Sensitive     PIP/TAZO <=4 SENSITIVE Sensitive     Extended ESBL NEGATIVE Sensitive     * >=100,000 COLONIES/mL KLEBSIELLA PNEUMONIAE  Culture, blood (Routine x 2)     Status: None (Preliminary result)   Collection Time: 09/19/17  4:57 AM  Result Value Ref Range Status   Specimen Description BLOOD LEFT HAND  Final   Special Requests   Final    BOTTLES DRAWN AEROBIC AND ANAEROBIC Blood Culture adequate volume   Culture   Final    NO GROWTH 3 DAYS Performed at [facility], [address], [place], [place] [address]    Report Status PENDING  Incomplete  MRSA PCR Screening     Status: None   Collection Time: 09/19/17 11:28 AM  Result Value Ref Range Status   MRSA by PCR NEGATIVE NEGATIVE Final    Comment:        The GeneXpert MRSA Assay (FDA approved for NASAL specimens only), is one component of a comprehensive MRSA colonization surveillance program. It is not intended to diagnose MRSA infection nor to guide or monitor treatment for MRSA infections.      Time coordinating discharge: Over 30 minutes  SIGNED:   Marene Lenz, MD  [facility] 09/22/2017, 1:26 PM Pager [phone]  If 7PM-7AM, please contact night-coverage [email or web address] Password [email or web address]

## 2017-09-24 LAB — CULTURE, BLOOD (ROUTINE X 2)
CULTURE: NO GROWTH
Culture: NO GROWTH
SPECIAL REQUESTS: ADEQUATE
Special Requests: ADEQUATE

## 2017-09-25 ENCOUNTER — Telehealth: Payer: Self-pay

## 2017-09-25 NOTE — Telephone Encounter (Signed)
Transition Care Management Follow-up Telephone Call   Date discharged? 09/22/2017   How have you been since you were released from the hospital? "still swollen"   Do you understand why you were in the hospital? yes, "an infection"   Do you understand the discharge instructions? yes   Where were you discharged to? Home. Lives with wife   Items Reviewed:  Medications reviewed: no, Pt states home meds remain the same with addition of antibiotic. Continues to hold Demadex as instructed at discharge.   Allergies reviewed: yes  Dietary changes reviewed: yes  Referrals reviewed: yes   Functional Questionnaire:   Activities of Daily Living (ADLs):   He states they are independent in the following: ambulation, bathing and hygiene, feeding, continence, grooming, toileting and dressing. Pt with indwelling catheter.  States they require assistance with the following: None   Any transportation issues/concerns?: no   Any patient concerns? yes, Pt continues to have lower extremity edema. Denies SOB.   Urine output: 1/26 0700=2800 ml    7:30 pm=1300 ml                1/27  0700=2800 ml    8 pm=1200 ml      1/28  0700=2300 ml            Confirmed importance and date/time of follow-up visits scheduled yes  Provider Appointment booked with PCP 09/27/2017 @ 11am.   Confirmed with patient if condition begins to worsen call PCP or go to the ER.  Patient was given the office number and encouraged to call back with question or concerns.  : yes

## 2017-09-27 ENCOUNTER — Encounter: Payer: Self-pay | Admitting: Family Medicine

## 2017-09-27 ENCOUNTER — Other Ambulatory Visit: Payer: Self-pay

## 2017-09-27 ENCOUNTER — Encounter: Payer: Self-pay | Admitting: General Practice

## 2017-09-27 ENCOUNTER — Ambulatory Visit (INDEPENDENT_AMBULATORY_CARE_PROVIDER_SITE_OTHER): Payer: Medicare Other | Admitting: Family Medicine

## 2017-09-27 VITALS — BP 131/80 | HR 63 | Temp 98.7°F | Resp 17 | Ht 71.0 in | Wt 287.2 lb

## 2017-09-27 DIAGNOSIS — N410 Acute prostatitis: Secondary | ICD-10-CM

## 2017-09-27 DIAGNOSIS — N179 Acute kidney failure, unspecified: Secondary | ICD-10-CM

## 2017-09-27 DIAGNOSIS — R339 Retention of urine, unspecified: Secondary | ICD-10-CM

## 2017-09-27 LAB — BASIC METABOLIC PANEL
BUN: 19 mg/dL (ref 6–23)
CALCIUM: 8.8 mg/dL (ref 8.4–10.5)
CHLORIDE: 100 meq/L (ref 96–112)
CO2: 31 mEq/L (ref 19–32)
CREATININE: 1.29 mg/dL (ref 0.40–1.50)
GFR: 56.48 mL/min — AB (ref >60.00)
Glucose, Bld: 113 mg/dL — ABNORMAL HIGH (ref 70–99)
Potassium: 5.2 mEq/L — ABNORMAL HIGH (ref 3.5–5.1)
Sodium: 139 mEq/L (ref 135–145)

## 2017-09-27 NOTE — Patient Instructions (Signed)
Follow up as scheduled in June- sooner if needed [facility] notify you of your lab results and make any changes if needed No changes until you see Urology Call with any questions or concerns Hang in there!!

## 2017-09-27 NOTE — Assessment & Plan Note (Signed)
Pt's Cr had returned to baseline prior to hospital d/c.  Repeat Cr today.

## 2017-09-27 NOTE — Assessment & Plan Note (Signed)
Pt is to complete a 4 week course of Cipro.  Pt is currently asymptomatic.  No further work up at this time.

## 2017-09-27 NOTE — Progress Notes (Signed)
   Subjective:    Patient ID: [name], male    DOB: 1932/01/12, 84 y.o.   MRN: [id number]  [facility] f/u- pt was hospitalized 1/22-25 w/ septic shock due to prostatitis.  He initially required pressors and ICU placement but was able to maintain his BP.  He was d/c'd on Cipro x4 weeks and told to f/u w/ urology.  He has foley in place due to urinary retention.  No CP, SOB.  No edema above baseline.  Pt has urology appt on 2/4.  Reviewed d/c notes, hospital progress notes, and labs.   Review of Systems For ROS see HPI     Objective:   Physical Exam  Constitutional: He is oriented to person, place, and time. He appears well-developed and well-nourished. No distress.  Morbidly obese  HENT:  Head: Normocephalic and atraumatic.  Cardiovascular: Normal rate, regular rhythm, normal heart sounds and intact distal pulses.  Pulmonary/Chest: Effort normal and breath sounds normal. No respiratory distress. He has no wheezes. He has no rales.  Abdominal: Soft. Bowel sounds are normal. He exhibits no distension. There is no tenderness. There is no rebound and no guarding.  obese  Musculoskeletal: He exhibits edema (bilateral LE edema). He exhibits no tenderness.  Neurological: He is alert and oriented to person, place, and time.  Skin: Skin is warm and dry.  Psychiatric: He has a normal mood and affect. His behavior is normal. Thought content normal.  Vitals reviewed.         Assessment & Plan:

## 2017-09-27 NOTE — Assessment & Plan Note (Signed)
Pt has foley in place.  Has f/u w/ urology on Feb 4.  No changes at this time

## 2017-09-28 ENCOUNTER — Encounter: Payer: Self-pay | Admitting: General Practice

## 2017-10-02 DIAGNOSIS — R338 Other retention of urine: Secondary | ICD-10-CM | POA: Diagnosis not present

## 2017-10-02 DIAGNOSIS — N410 Acute prostatitis: Secondary | ICD-10-CM | POA: Diagnosis not present

## 2017-10-03 ENCOUNTER — Telehealth: Payer: Self-pay | Admitting: Family Medicine

## 2017-10-03 NOTE — Telephone Encounter (Signed)
Restart furosemide 20mg - 2 in the AM, 1 qPM.  Will need OV in 1 week to recheck swelling and labs

## 2017-10-03 NOTE — Telephone Encounter (Signed)
Patient states that he has been released from the Urologist and he does not have to go back unless something else comes up.   Patient states that his legs are swollen and he is wanting to know if Dr. Birdie Riddle wants him to go back on the fluid pills.  He has not been taking them, but he currently has Furosemide  20mg  taking 2 in AM, 1 in PM.  Patient also hasTorsemide 20mg , 2 daily.   Would she like him to restart these medications?   Routed to provider to advise.

## 2017-10-03 NOTE — Telephone Encounter (Signed)
Copied from [place] [phone]. Topic: Quick Communication - See Telephone Encounter >> Oct 03, 2017  9:45 AM Robina Ade, Helene Kelp D wrote: CRM for notification. See Telephone encounter for: 10/03/17. Patient called and said that he saw his urologist yesterday and he wants to know if he could have fluid pills because his legs are swollen and he does not need to go back to see them unless he needs to. He would like to talk to [facility] or provider about this. Please call patient back.

## 2017-10-03 NOTE — Telephone Encounter (Signed)
Patient is aware to restart medication.    Follow-up has been made for next week.

## 2017-10-10 ENCOUNTER — Other Ambulatory Visit: Payer: Self-pay

## 2017-10-10 ENCOUNTER — Ambulatory Visit (INDEPENDENT_AMBULATORY_CARE_PROVIDER_SITE_OTHER): Payer: Medicare Other | Admitting: Family Medicine

## 2017-10-10 ENCOUNTER — Encounter: Payer: Self-pay | Admitting: Family Medicine

## 2017-10-10 VITALS — BP 133/78 | HR 74 | Temp 98.6°F | Resp 16 | Ht 71.0 in | Wt 281.2 lb

## 2017-10-10 DIAGNOSIS — R600 Localized edema: Secondary | ICD-10-CM

## 2017-10-10 DIAGNOSIS — E1121 Type 2 diabetes mellitus with diabetic nephropathy: Secondary | ICD-10-CM

## 2017-10-10 LAB — BASIC METABOLIC PANEL
BUN: 25 mg/dL — AB (ref 6–23)
CALCIUM: 9.2 mg/dL (ref 8.4–10.5)
CO2: 31 mEq/L (ref 19–32)
Chloride: 103 mEq/L (ref 96–112)
Creatinine, Ser: 1.04 mg/dL (ref 0.40–1.50)
GFR: 72.41 mL/min (ref >60.00)
Glucose, Bld: 110 mg/dL — ABNORMAL HIGH (ref 70–99)
Potassium: 5.2 mEq/L — ABNORMAL HIGH (ref 3.5–5.1)
SODIUM: 138 meq/L (ref 135–145)

## 2017-10-10 MED ORDER — GLUCOSE BLOOD VI STRP: ORAL_STRIP | 3 refills | Status: DC

## 2017-10-10 MED ORDER — ACCU-CHEK AVIVA PLUS W/DEVICE KIT: PACK | 0 refills | Status: DC

## 2017-10-10 NOTE — Patient Instructions (Signed)
Follow up in 1-2 weeks to recheck swelling STOP the Lasix RESTART the Torsemide twice daily Try and limit the sodium (salt) intake in your diet- lunch meat, bacon, sausage, ham, soup, microwave meals, etc Drink plenty of fluids Call with any questions or concerns Hang in there!!!

## 2017-10-10 NOTE — Progress Notes (Signed)
   Subjective:    Patient ID: [name], male    DOB: [birth year], 84 y.o.   MRN: [id number]  HPI Swelling- pt restarted his Lasix 40mg  qAM and 20mg  qPM.  Pt reports no relief w/ Lasix.  Denies salt intake.  Pt eats 2 meals/day- 1 is often soup and sandwiches.  Will eat ham or roast beef.  Will eat chicken noodle soup, cream of mushroom, and Clam Chowder.  Pt reports good water intake- 'at least a liter a day'.  DM- chronic problem.  Pt is upset that his insurance requires Freestyle meters rather than his previous Accucheck.  He asked for prescription to take to [facility] for meter and strips as he intends to pay out of pocket.   Review of Systems For ROS see HPI     Objective:   Physical Exam  Constitutional: He appears well-developed and well-nourished. No distress.  Morbidly obese, sitting in wheel chair  HENT:  Head: Normocephalic and atraumatic.  Cardiovascular: Normal rate, regular rhythm, normal heart sounds and intact distal pulses.  Pulmonary/Chest: Effort normal and breath sounds normal. No respiratory distress. He has no wheezes. He has no rales.  Abdominal: Soft. Bowel sounds are normal. There is no tenderness. There is no rebound and no guarding.  obese  Musculoskeletal: He exhibits edema (1-2+ edema of feet bilaterally).  Psychiatric:  irritable  Vitals reviewed.         Assessment & Plan:

## 2017-10-11 NOTE — Assessment & Plan Note (Signed)
Chronic problem.  Pt asked for prescription to take to the [facility] for his Accucheck meter as he prefers to pay out of pocket.  I printed the scripts and when CMA returned to give instructions, AVS, and prescriptions apparently pt became irate.  I heard from multiple staff members- CMA, lab CMA, and 2 people at the front desk- that pt was angry, yelling, saying that no one listened to him or knew how to do their job.  Pt would not listen to CMA- continued to yell and talk over her.  She was visibly shaken by the encounter.  This is inappropriate behavior and will need to discuss w/ office administration as to whether pt can continue care here.

## 2017-10-11 NOTE — Progress Notes (Signed)
When giving/reviewing AVS with Patient and handing him his printed glucometer/strip prescription he became very loud and belligerent. He stated that PCP did not listen to him at all and that the prescription needed to be sent to [facility] and not handed to him because "[facility] cannot fill this for me"  When I tried to advise patient that this would be the only way he could receive the glucometer brand of his choice, he said "honey, you don't know what you are talking about either". I advised the patient that I was not his "Honey" and that if I sent it to the pharmacy they were going to deny it because it is not the preferred medication. Patient became very loud at that time and my fellow staff members in the lab/front desk could hear the conversation word for word.

## 2017-10-11 NOTE — Assessment & Plan Note (Signed)
Ongoing issue.  Pt reports his Lasix is ineffective and only produces small amounts of urine despite 40mg  QAM and 20mg  QPM.  Reviewed his diet- pt and wife were unaware that deli meats and soups are high in sodium- which is likely contributing to his swelling.  Will switch back to Torsemide and monitor for improved diuresis.  Check BMP as pt has been taking his Lasix.  Reviewed supportive care and red flags that should prompt return.  Pt expressed understanding and is in agreement w/ plan.  Total time spent w/ pt and wife- 30 minutes, >50% spent counseling on sodium intake and how to read labels.

## 2017-10-12 ENCOUNTER — Encounter: Payer: Self-pay | Admitting: Pulmonary Disease

## 2017-10-12 ENCOUNTER — Ambulatory Visit (INDEPENDENT_AMBULATORY_CARE_PROVIDER_SITE_OTHER): Payer: Medicare Other | Admitting: Pulmonary Disease

## 2017-10-12 VITALS — BP 122/70 | HR 80 | Ht 71.0 in | Wt 281.0 lb

## 2017-10-12 DIAGNOSIS — I272 Pulmonary hypertension, unspecified: Secondary | ICD-10-CM

## 2017-10-12 DIAGNOSIS — J986 Disorders of diaphragm: Secondary | ICD-10-CM | POA: Diagnosis not present

## 2017-10-12 NOTE — Patient Instructions (Signed)
Will arrange for overnight oxygen test  Follow up in 8 weeks

## 2017-10-12 NOTE — Progress Notes (Signed)
[facility]  Chief Complaint  Patient presents with  . pulm consult    Pt referred by PCP Dr. Birdie Riddle MD. Pt has gained 40lbs of fluid in 30 days.     Vital signs: BP 122/70 (BP Location: Left Arm, Cuff Size: Normal)   Pulse 80   Ht '5\' 11"'  (1.803 m)   Wt 281 lb (127.5 kg)   SpO2 98%   BMI 39.19 kg/m   History of Present Illness: [name] is a 84 y.o. male with edema.  He was in hospital after gaining 40 lbs of fluid.  He had trouble passing urine.  He was treated with diuretics.  He was also treated for UTI with sepsis.  He finally had foley removed.  He still has trouble with urgency and decreased urine flow.    He isn't feeling short of breath.  He denies chest pain, or palpitations.  He doesn't feel like he has any trouble with his sleep.  He denies cough, wheeze, sputum, or hemoptysis.    He has a history of sleep apnea.  He tried CPAP, but didn't think this helped and stopped using it.  He was found to have a PE around the time he had gallbladder surgery.  He isn't on blood thinners anymore.   His chest xray showed elevated right diaphragm with basilar atelectasis.  He isn't able to walk due to joint pains.  He gets around in his scooter, but he is able to stand with assistance.    Physical Exam:  General - pleasant Eyes - pupils reactive, wears glasses ENT - no sinus tenderness, no oral exudate, no LAN Cardiac - regular, no murmur Chest - no wheeze, rales Abd - soft, non tender Ext - 2+ edema Skin - no rashes Neuro - normal strength Psych - normal mood  Discussion: He has elevated pulmonary artery pressure on Echo and chest xray showing elevated right diaphragm.  He has prior sleep study showing moderate sleep apnea.  He had difficulty tolerating CPAP, although it is not clear what the difficulty was.  I suspect he has sleep disordered breathing contributing to pulmonary hypertension.  Plan: - will arrange for  overnight oximetry on room air - depending on result will determine if he needs to have SNIFF test, supplemental oxygen, or resume CPAP   Patient Instructions  Will arrange for overnight oxygen test  Follow up in 8 weeks    Chesley Mires, MD [place] 10/12/2017, 2:44 PM Pager:  [phone]  Flow Sheet  Pulmonary tests: CT angio chest 07/20/16 >> age indeterminate PE LLL  Sleep tests: PSG 12/23/15 >> AHI 22.6, SpO2 low 76%  Cardiac tests: Echo 09/16/17 >> EF 60 to 65%, PAS 35 mmHg  Review of Systems: Constitutional: Negative for fever and unexpected weight change.  HENT: Negative for congestion, dental problem, ear pain, nosebleeds, postnasal drip, rhinorrhea, sinus pressure, sneezing, sore throat and trouble swallowing.   Eyes: Negative for redness and itching.  Respiratory: Negative for cough, chest tightness, shortness of breath and wheezing.   Cardiovascular: Positive for leg swelling. Negative for palpitations.  Gastrointestinal: Negative for nausea and vomiting.  Genitourinary: Positive for difficulty urinating and dysuria.  Musculoskeletal: Positive for joint swelling.  Skin: Negative for rash.  Allergic/Immunologic: Negative.  Negative for environmental allergies, food allergies and immunocompromised state.  Neurological: Negative for headaches.  Hematological: Does not bruise/bleed easily.  Psychiatric/Behavioral: Negative for dysphoric mood. The patient is not nervous/anxious.  Past Medical History: He  has a past medical history of Arthritis, Chronic back pain, Constipation due to opioid therapy, Diabetes mellitus without complication ([place]), Full dentures, Gallbladder attack, Gastric perforation ([place]), GERD (gastroesophageal reflux disease), Hypercholesteremia, Hypertension, Pneumonia, Post traumatic stress disorder (PTSD), Pulmonary embolism ([place]), Sleep apnea, Wears glasses, and Wears hearing aid.  Past Surgical History: He  has a past  surgical history that includes Rotator cuff repair; Ankle surgery; Lumbar laminectomy/decompression microdiscectomy (Left, 11/15/2012); laparoscopy (N/A, 11/17/2012); Colonoscopy; Trigger finger release (Left, 01/21/2014); Back surgery; bowel perforation surgery; ORIF calcaneous fracture (Right, 02/19/2015); ir generic historical (07/21/2016); ir generic historical (08/15/2016); ir generic historical (08/25/2016); ir generic historical (09/05/2016); and Cholecystectomy (N/A, 01/16/2017).  Family History: His family history is not on file.  Social History: He  reports that  has never smoked. His smokeless tobacco use includes chew. He reports that he drinks alcohol. He reports that he does not use drugs.  Medications: Allergies as of 10/12/2017      Reactions   Oxycontin [oxycodone Hcl] Other (See Comments)   Pt states he cant sleep w/ this med; also makes him constipated.       Medication List        Accurate as of 10/12/17  2:44 PM. Always use your most recent med list.          ACCU-CHEK AVIVA PLUS w/Device Kit Use glucometer to test sugars 4 times daily. Dx. E11.9   carvedilol 3.125 MG tablet Commonly known as:  COREG TAKE 1 TABLET TWICE A DAY WITH MEALS   ciprofloxacin 500 MG tablet Commonly known as:  CIPRO Take 1 tablet (500 mg total) by mouth 2 (two) times daily for 24 days.   cyanocobalamin 500 MCG tablet Take 500 mcg by mouth 2 (two) times daily.   docusate sodium 100 MG capsule Commonly known as:  COLACE Take 1 capsule (100 mg total) by mouth 2 (two) times daily.   freestyle lancets Use as instructed to check sugars 4 times daily. Dx E11.9   galantamine 16 MG 24 hr capsule Commonly known as:  RAZADYNE ER Take 16 mg by mouth daily with breakfast.   glucose blood test strip Commonly known as:  ACCU-CHEK AVIVA PLUS Use one strip to test sugars. Pt checks sugars 4 times daily. Dx. E11.9   HUMALOG 100 UNIT/ML injection Generic drug:  insulin lispro Inject 0-5 Units  into the skin 3 (three) times daily before meals. 0-200 = 0 units, 201-250 = 2 units, 251-300 = 3 units, 301-350 = 4 units, 351-400 = 5 units, >401 call MD/NP   HYDROmorphone 2 MG tablet Commonly known as:  DILAUDID Take 1 tablet (2 mg total) by mouth 2 (two) times daily as needed for severe pain.   insulin glargine 100 UNIT/ML injection Commonly known as:  LANTUS Inject 20 Units into the skin at bedtime.   lactose free nutrition Liqd Take 237 mLs by mouth daily as needed (nutrition).   magnesium oxide 400 MG tablet Commonly known as:  MAG-OX Take 400 mg by mouth 2 (two) times daily.   Melatonin 5 MG Tabs Take 5 mg by mouth at bedtime.   methocarbamol 750 MG tablet Commonly known as:  ROBAXIN Take 1 tablet (750 mg total) by mouth every 6 (six) hours as needed for muscle spasms.   pantoprazole 40 MG tablet Commonly known as:  PROTONIX Take 1 tablet (40 mg total) by mouth daily.   polyethylene glycol packet Commonly known as:  MIRALAX / GLYCOLAX Take 17-25.5  g by mouth daily.   PRESERVISION AREDS 2 PO Take 1 capsule by mouth 2 (two) times daily.   QUEtiapine 50 MG tablet Commonly known as:  SEROQUEL Take 50 mg by mouth at bedtime.   rosuvastatin 40 MG tablet Commonly known as:  CRESTOR Take 20 mg by mouth every evening.   tamsulosin 0.4 MG Caps capsule Commonly known as:  FLOMAX Take 1 capsule (0.4 mg total) by mouth daily after supper.   THERATEARS OP Apply 1 drop to eye 3 (three) times daily as needed (dry eyes).   traZODone 50 MG tablet Commonly known as:  DESYREL Take 50 mg by mouth at bedtime.   Vitamin D3 2000 units capsule Take 2,000 Units by mouth 2 (two) times daily.

## 2017-10-12 NOTE — Progress Notes (Signed)
   Subjective:    Patient ID: [name], male    DOB: 03/22/1932, 84 y.o.   MRN: [id number]  HPI    Review of Systems  Constitutional: Negative for fever and unexpected weight change.  HENT: Negative for congestion, dental problem, ear pain, nosebleeds, postnasal drip, rhinorrhea, sinus pressure, sneezing, sore throat and trouble swallowing.   Eyes: Negative for redness and itching.  Respiratory: Negative for cough, chest tightness, shortness of breath and wheezing.   Cardiovascular: Positive for leg swelling. Negative for palpitations.  Gastrointestinal: Negative for nausea and vomiting.  Genitourinary: Positive for difficulty urinating and dysuria.  Musculoskeletal: Positive for joint swelling.  Skin: Negative for rash.  Allergic/Immunologic: Negative.  Negative for environmental allergies, food allergies and immunocompromised state.  Neurological: Negative for headaches.  Hematological: Does not bruise/bleed easily.  Psychiatric/Behavioral: Negative for dysphoric mood. The patient is not nervous/anxious.        Objective:   Physical Exam        Assessment & Plan:

## 2017-10-16 ENCOUNTER — Telehealth: Payer: Self-pay | Admitting: Pulmonary Disease

## 2017-10-16 NOTE — Telephone Encounter (Signed)
Called and spoke with patient, advised him that the order was received and they would contact him in regards to set it up.

## 2017-10-19 ENCOUNTER — Telehealth: Payer: Self-pay | Admitting: Family Medicine

## 2017-10-19 NOTE — Telephone Encounter (Signed)
Error/meter already sent

## 2017-10-20 ENCOUNTER — Encounter: Payer: Self-pay | Admitting: Pulmonary Disease

## 2017-10-24 ENCOUNTER — Ambulatory Visit: Payer: Medicare Other | Admitting: Family Medicine

## 2017-10-24 ENCOUNTER — Telehealth: Payer: Self-pay | Admitting: Pulmonary Disease

## 2017-10-24 DIAGNOSIS — Z0289 Encounter for other administrative examinations: Secondary | ICD-10-CM

## 2017-10-24 DIAGNOSIS — I270 Primary pulmonary hypertension: Secondary | ICD-10-CM | POA: Diagnosis not present

## 2017-10-24 DIAGNOSIS — I272 Pulmonary hypertension, unspecified: Secondary | ICD-10-CM

## 2017-10-24 NOTE — Telephone Encounter (Signed)
Spoke with Caryl Pina. He states pt's Basilar O2 was at  88.5 and dropped down to 77% during most of the night. He wanted to see if VS wanted to order nighttime O2. I did not see results in VS folder. Did you see this result?

## 2017-10-24 NOTE — Telephone Encounter (Signed)
[name] from [facility] is returning call. Cb is [phone].

## 2017-10-24 NOTE — Telephone Encounter (Signed)
ATC pt, no answer. Left message for pt to call back.  

## 2017-10-25 ENCOUNTER — Telehealth: Payer: Self-pay | Admitting: Pulmonary Disease

## 2017-10-25 DIAGNOSIS — I272 Pulmonary hypertension, unspecified: Secondary | ICD-10-CM

## 2017-10-25 NOTE — Telephone Encounter (Signed)
Called [name] letting him know information stated by Caryl Pina with [facility] and that VS has stated we set [name] up for a 6 min walk test.  [name] stated to me he will be unable to do the 6 min walk.  Dr. Halford Chessman, any other suggestions you have for [place] regarding [name]?

## 2017-10-25 NOTE — Telephone Encounter (Signed)
Order has been placed for oxygen therapy and repeat ONO per Dr. Halford Chessman.

## 2017-10-25 NOTE — Telephone Encounter (Signed)
Order isn't for OSA it is for pulmonary hypertension.

## 2017-10-25 NOTE — Telephone Encounter (Signed)
ONO with RA 10/20/17 >> test time 3 hrs 28 min.  Basal SpO2 88.5%, SpO2 low 77%.  Spent 96.6 min with SpO2 < 88%.  Spoke with patient explained that he has oxygen desaturation at night and this is likely contributing to his pulmonary hypertension.    Will arrange for 2 liters oxygen at night and then repeat ONO with 2 liters.

## 2017-10-25 NOTE — Telephone Encounter (Signed)
Spoke with Caryl Pina at [facility] states that he recognizes that O2 was ordered for pulm htn, but because patient has been dx'ed in the past with OSA he must either have a cpap titration to prove that a cpap will not correct his nocturnal O2 desaturation, or he can have a walk in office to document exertional O2 as well.   Caryl Pina states that this is per medicare guidelines.    VS please advise.  Thanks!

## 2017-10-25 NOTE — Telephone Encounter (Signed)
Spoke with Caryl Pina with [facility]. He stated that the patient does not qualify for night time O2 with a diagnosis of OSA. He stated that the patient would either need an in lab CPAP titration to show that he will need the oxygen in addition to his CPAP machine.   He also stated that we could also a 6 minute walk and qualify him for 24/7 oxygen.   VS, please advise what you would like for the patient to do. Thanks!

## 2017-10-25 NOTE — Telephone Encounter (Signed)
We could try setting him up for an arterial blood gas on room air.

## 2017-10-25 NOTE — Telephone Encounter (Signed)
Go ahead and set him up for a 6 minute walk test, please.

## 2017-10-26 NOTE — Telephone Encounter (Signed)
Called and spoke with Caryl Pina with [facility]  Advised him of VS recommendations and requests He states that he will contact his team lead Estill Bamberg to review VS recommendations He will send docs to my attn tomorrow to complete for pt and give to VS for review Will follow up with this more tomorrow with Caryl Pina with [facility]

## 2017-10-26 NOTE — Telephone Encounter (Signed)
Called and spoke to [name] with [facility] and he states if an ABG were to be performed insurance would not use that to help cover the O2. The only two options are a walk test (to document O2 de-saturation) or a CPAP titration, showing desaturation that is not corrected with just the CPAP but is needing O2 as well. Called and spoke to the pt and he states he completely unable to walk due to a previous foot injury.   Dr. Halford Chessman please advise if you are ok ordering a CPAP titration. Thank you.

## 2017-10-26 NOTE — Telephone Encounter (Signed)
Can you find out why an ABG showing low PaO2 wouldn't qualify him for home oxygen set up?  My understanding is that this should qualify him if his PaO2 is < 55 mmHg, or if it is < 60 mmHg if he has heart failure, p pulmonale, or peripheral edema.

## 2017-10-26 NOTE — Telephone Encounter (Signed)
Left voice mail on machine for [place] with [facility] to return phone call back regarding below info from [place]

## 2017-10-26 NOTE — Telephone Encounter (Signed)
Can you have Caryl Pina fax over documentation for this requirement from medicare that says he needs a walk test or CPAP titration to get daytime supplemental oxygen therapy because he has a history of OSA.  My understanding is that if the patient has daytime resting hypoxia on room air by blood gas analysis, then he should qualify for home oxygen set up and this is independent of whether he has history of sleep apnea.

## 2017-10-26 NOTE — Telephone Encounter (Signed)
Called the Medicare provider line @ [phone] and was told this is a DME issue Eustace Pen with [facility] to further discuss  Caryl Pina apologizes for the inconvenience and understands the frustration.  He reports that because patient has a documented history of OSA Medicare is requiring the O2 be qualified by walk test or CPAP titration.  So sorry Dr. Halford Chessman.  Please advise how you would like to proceed.  Thank you.

## 2017-10-27 NOTE — Telephone Encounter (Signed)
rec'd call back from [place] with [facility] He stated that pt will need a 105min walk or ABG Per VS, ABG- patient is not able to complete a 47min walk test Placed order for ABG

## 2017-10-30 ENCOUNTER — Ambulatory Visit (HOSPITAL_COMMUNITY)
Admission: RE | Admit: 2017-10-30 | Discharge: 2017-10-30 | Disposition: A | Discharge status: Home / Self Care | Payer: Medicare Other | Source: Ambulatory Visit | Attending: Pulmonary Disease | Admitting: Pulmonary Disease

## 2017-10-30 DIAGNOSIS — I272 Pulmonary hypertension, unspecified: Secondary | ICD-10-CM

## 2017-10-30 LAB — BLOOD GAS, ARTERIAL
ACID-BASE EXCESS: 2.7 mmol/L — AB (ref 0.0–2.0)
BICARBONATE: 27.2 mmol/L (ref 20.0–28.0)
DRAWN BY: 244901
FIO2: 21
O2 Saturation: 94 %
PO2 ART: 72.4 mmHg — AB (ref 83.0–108.0)
Patient temperature: 98.6
pCO2 arterial: 43.4 mmHg (ref 32.0–48.0)
pH, Arterial: 7.414 (ref 7.350–7.450)

## 2017-10-31 ENCOUNTER — Telehealth: Payer: Self-pay | Admitting: Pulmonary Disease

## 2017-10-31 NOTE — Telephone Encounter (Signed)
Called and spoke with patient he is aware of VS advisement. Patient states that he does not think he wants the oxygen. VS please advise are you ok with this, he wants to make sure you are ok with him not using it.

## 2017-10-31 NOTE — Telephone Encounter (Signed)
Pt is returning call. Cb is [phone]

## 2017-10-31 NOTE — Telephone Encounter (Signed)
ABG    Component Value Date/Time   PHART 7.414 10/30/2017 1318   PCO2ART 43.4 10/30/2017 1318   PO2ART 72.4 (L) 10/30/2017 1318   HCO3 27.2 10/30/2017 1318   TCO2 29 07/20/2016 1148   O2SAT 94.0 10/30/2017 1318      Please let him know that his oxygen level on blood gas was not low enough to qualify for home oxygen set up.  His insurance will not allow oxygen set up, unless he has an in lab sleep study showing that CPAP won't work.  Other option is that he could purchase supplemental oxygen on his own, but this can be very expensive.

## 2017-11-13 NOTE — Telephone Encounter (Signed)
Routing message to VS for review VS please advise if you are ok that pt is not using O2 nor wants to remain using O2

## 2017-11-13 NOTE — Telephone Encounter (Signed)
Please let him know that my recommendation is he try CPAP and then see if he needs supplemental oxygen.  If he continues to decline CPAP, then he would not be eligible for insurance coverage for home oxygen set up.  As long as he understands the risk to his heart function as detailed during his visit in February 2019 since he wouldn't be using CPAP or supplemental oxygen, then it is okay not to pursue this any further.

## 2017-11-14 NOTE — Telephone Encounter (Signed)
Called and spoke with patient regarding using patient Patient states he is not wanting to really use the cpap machine nor O2 Advised pt of VS recommendations Pt verbalized understanding; and commits to begin using cpap machine this week Will follow up with pt on 11/27/2017 to see if he is using cpap each night  And check to see how pt is feeling using the cpap

## 2017-11-27 NOTE — Telephone Encounter (Signed)
Left message for patient to return call Checking to see if patient is using CPAP machine since 10/27/2017

## 2017-11-28 NOTE — Telephone Encounter (Signed)
Pt is calling back [phone]

## 2017-11-28 NOTE — Telephone Encounter (Signed)
Spoke with pt, he states he is using the CPAP but he feels it is not doing him any good. I looked up his report on [facility] but he is not enrolled. He has appt with VS on 4/12. Chrisandra Carota

## 2017-11-29 NOTE — Telephone Encounter (Signed)
LVM for patient to return my call regarding cpap machine X1 Advised pt to con't using cpap machine until appt with VS on 12/08/2017

## 2017-12-08 ENCOUNTER — Ambulatory Visit (INDEPENDENT_AMBULATORY_CARE_PROVIDER_SITE_OTHER): Payer: Medicare Other | Admitting: Pulmonary Disease

## 2017-12-08 ENCOUNTER — Encounter: Payer: Self-pay | Admitting: Pulmonary Disease

## 2017-12-08 VITALS — BP 106/60 | HR 85 | Ht 70.0 in | Wt 269.8 lb

## 2017-12-08 DIAGNOSIS — I272 Pulmonary hypertension, unspecified: Secondary | ICD-10-CM | POA: Diagnosis not present

## 2017-12-08 DIAGNOSIS — G4733 Obstructive sleep apnea (adult) (pediatric): Secondary | ICD-10-CM

## 2017-12-08 NOTE — Progress Notes (Signed)
[facility]  Chief Complaint  Patient presents with  . Follow-up    OSA-wearing CPAP again     Vital signs: BP 106/60 (BP Location: Right Arm, Cuff Size: Normal)   Pulse 85   Ht '5\' 10"'  (1.778 m)   Wt 269 lb 12.8 oz (122.4 kg)   SpO2 95%   BMI 38.71 kg/m   History of Present Illness: [name] is a 84 y.o. male with edema.  Since his last visit he started back on CPAP.  He uses it nightly.  Not causing any trouble with his sleep.  He didn't feel like he had a problem with his sleep to begin with.   Physical Exam:  General - pleasant Eyes - pupils reactive ENT - no sinus tenderness, no oral exudate, no LAN Cardiac - regular, no murmur Chest - no wheeze, rales Abd - soft, non tender Ext - 1+ edema Skin - no rashes Neuro - normal strength Psych - normal mood   Assessment/plan:  Pulmonary hypertension in setting of OSA. - he is back on CPAP - he didn't qualify for supplemental oxygen, and doesn't want to repeat a sleep study - he is compliant with CPAP - he plans to follow up with the [place]    Patient Instructions  Follow up as needed    Chesley Mires, MD [place] 12/08/2017, 2:23 PM Pager:  [phone]  Flow Sheet  Pulmonary tests: CT angio chest 07/20/16 >> age indeterminate PE LLL RA ABG 10/30/17 >> pH 7.41, PCO2 43.4, PO2 72.4  Sleep tests: PSG 12/23/15 >> AHI 22.6, SpO2 low 76% ONO with RA 10/20/17 >> test time 3 hrs 28 min.  Basal SpO2 88.5%, SpO2 low 77%.  Spent 96.6 min with SpO2 < 88%.  Cardiac tests: Echo 09/16/17 >> EF 60 to 65%, PAS 35 mmHg  Past Medical History: He  has a past medical history of Arthritis, Chronic back pain, Constipation due to opioid therapy, Diabetes mellitus without complication ([place]), Full dentures, Gallbladder attack, Gastric perforation ([place]), GERD (gastroesophageal reflux disease), Hypercholesteremia, Hypertension, Pneumonia, Post traumatic stress disorder  (PTSD), Pulmonary embolism ([place]), Sleep apnea, Wears glasses, and Wears hearing aid.  Past Surgical History: He  has a past surgical history that includes Rotator cuff repair; Ankle surgery; Lumbar laminectomy/decompression microdiscectomy (Left, 11/15/2012); laparoscopy (N/A, 11/17/2012); Colonoscopy; Trigger finger release (Left, 01/21/2014); Back surgery; bowel perforation surgery; ORIF calcaneous fracture (Right, 02/19/2015); ir generic historical (07/21/2016); ir generic historical (08/15/2016); ir generic historical (08/25/2016); ir generic historical (09/05/2016); and Cholecystectomy (N/A, 01/16/2017).  Family History: His family history is not on file.  Social History: He  reports that he has never smoked. His smokeless tobacco use includes chew. He reports that he drinks alcohol. He reports that he does not use drugs.  Medications: Allergies as of 12/08/2017      Reactions   Oxycontin [oxycodone Hcl] Other (See Comments)   Pt states he cant sleep w/ this med; also makes him constipated.       Medication List        Accurate as of 12/08/17  2:23 PM. Always use your most recent med list.          ACCU-CHEK AVIVA PLUS w/Device Kit Use glucometer to test sugars 4 times daily. Dx. E11.9   carvedilol 3.125 MG tablet Commonly known as:  COREG TAKE 1 TABLET TWICE A DAY WITH MEALS   cyanocobalamin 500 MCG tablet Take 500 mcg by mouth 2 (two) times  daily.   docusate sodium 100 MG capsule Commonly known as:  COLACE Take 1 capsule (100 mg total) by mouth 2 (two) times daily.   freestyle lancets Use as instructed to check sugars 4 times daily. Dx E11.9   galantamine 16 MG 24 hr capsule Commonly known as:  RAZADYNE ER Take 16 mg by mouth daily with breakfast.   glucose blood test strip Commonly known as:  ACCU-CHEK AVIVA PLUS Use one strip to test sugars. Pt checks sugars 4 times daily. Dx. E11.9   HUMALOG 100 UNIT/ML injection Generic drug:  insulin lispro Inject 0-5 Units  into the skin 3 (three) times daily before meals. 0-200 = 0 units, 201-250 = 2 units, 251-300 = 3 units, 301-350 = 4 units, 351-400 = 5 units, >401 call MD/NP   HYDROmorphone 2 MG tablet Commonly known as:  DILAUDID Take 1 tablet (2 mg total) by mouth 2 (two) times daily as needed for severe pain.   insulin glargine 100 UNIT/ML injection Commonly known as:  LANTUS Inject 20 Units into the skin at bedtime.   lactose free nutrition Liqd Take 237 mLs by mouth daily as needed (nutrition).   magnesium oxide 400 MG tablet Commonly known as:  MAG-OX Take 400 mg by mouth 2 (two) times daily.   Melatonin 5 MG Tabs Take 5 mg by mouth at bedtime.   methocarbamol 750 MG tablet Commonly known as:  ROBAXIN Take 1 tablet (750 mg total) by mouth every 6 (six) hours as needed for muscle spasms.   pantoprazole 40 MG tablet Commonly known as:  PROTONIX Take 1 tablet (40 mg total) by mouth daily.   polyethylene glycol packet Commonly known as:  MIRALAX / GLYCOLAX Take 17-25.5 g by mouth daily.   PRESERVISION AREDS 2 PO Take 1 capsule by mouth 2 (two) times daily.   QUEtiapine 50 MG tablet Commonly known as:  SEROQUEL Take 50 mg by mouth at bedtime.   rosuvastatin 40 MG tablet Commonly known as:  CRESTOR Take 20 mg by mouth every evening.   tamsulosin 0.4 MG Caps capsule Commonly known as:  FLOMAX Take 1 capsule (0.4 mg total) by mouth daily after supper.   THERATEARS OP Apply 1 drop to eye 3 (three) times daily as needed (dry eyes).   traZODone 50 MG tablet Commonly known as:  DESYREL Take 50 mg by mouth at bedtime.   Vitamin D3 2000 units capsule Take 2,000 Units by mouth 2 (two) times daily.

## 2017-12-08 NOTE — Patient Instructions (Signed)
Follow up as needed

## 2017-12-19 IMAGING — DX DG CHEST 1V PORT
1 series · 1 of 1 positions shown · non-contrast
Comparison: Chest radiograph performed 07/22/2016, and CTA of the
chest performed 07/20/2016

CLINICAL DATA: Acute onset of sepsis.  Initial encounter.

EXAM:
PORTABLE CHEST 1 VIEW

[chest ap]
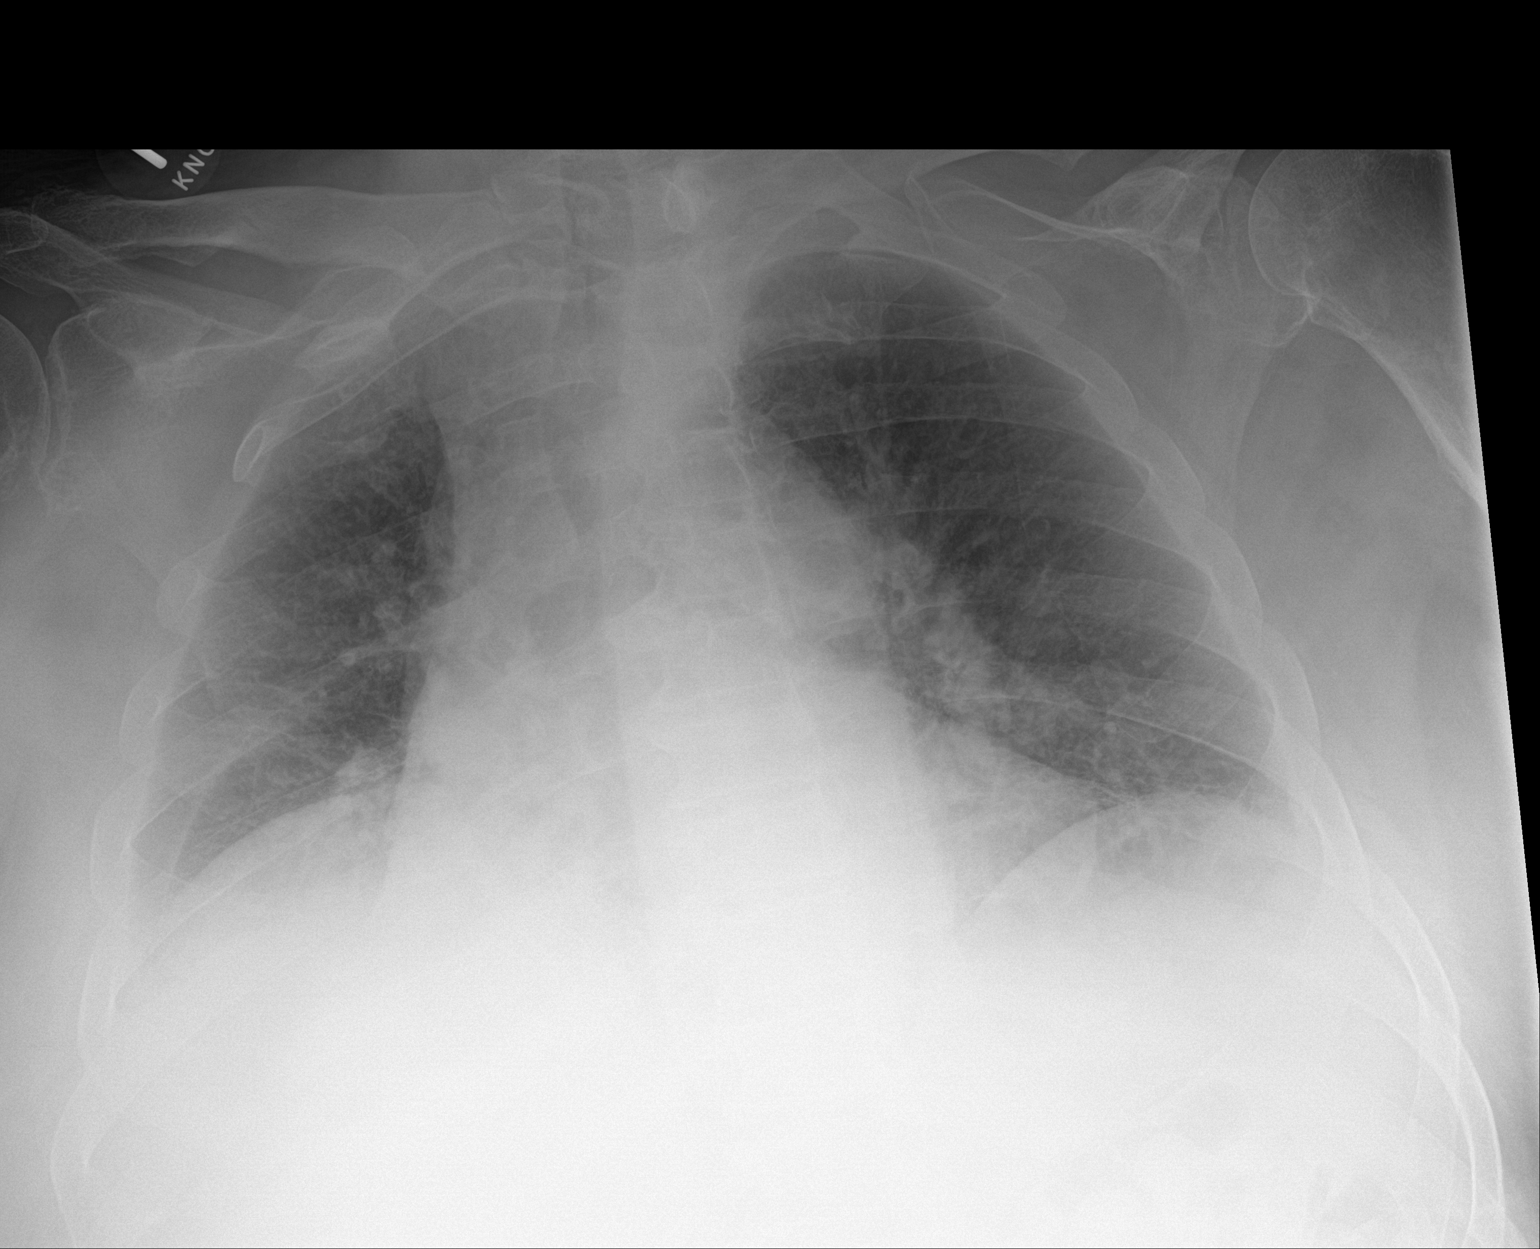

[1 of 1 positions shown; findings below may reference images not displayed]

FINDINGS: The lungs are hypoexpanded. Vascular congestion is noted. Bibasilar
airspace opacities may reflect atelectasis or possibly mild
pneumonia. There is no evidence of pleural effusion or pneumothorax.

The cardiomediastinal silhouette is enlarged. No acute osseous
abnormalities are seen.
IMPRESSION: Lungs hypoexpanded. Vascular congestion and cardiomegaly. Bibasilar
airspace opacities may reflect atelectasis or possibly mild
pneumonia.

## 2018-02-08 ENCOUNTER — Ambulatory Visit: Payer: Medicare Other

## 2018-02-08 ENCOUNTER — Ambulatory Visit: Payer: Medicare Other | Admitting: Family Medicine
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
## 2018-03-08 ENCOUNTER — Encounter: Payer: Self-pay | Admitting: Family Medicine

## 2018-03-08 ENCOUNTER — Telehealth: Payer: Self-pay

## 2018-03-08 ENCOUNTER — Other Ambulatory Visit: Payer: Self-pay

## 2018-03-08 ENCOUNTER — Ambulatory Visit (INDEPENDENT_AMBULATORY_CARE_PROVIDER_SITE_OTHER): Payer: Medicare Other

## 2018-03-08 ENCOUNTER — Ambulatory Visit (INDEPENDENT_AMBULATORY_CARE_PROVIDER_SITE_OTHER): Payer: Medicare Other | Admitting: Family Medicine

## 2018-03-08 VITALS — BP 122/68 | HR 74 | Temp 98.2°F | Resp 18 | Ht 70.0 in | Wt 267.4 lb

## 2018-03-08 DIAGNOSIS — E669 Obesity, unspecified: Secondary | ICD-10-CM | POA: Diagnosis not present

## 2018-03-08 DIAGNOSIS — E1121 Type 2 diabetes mellitus with diabetic nephropathy: Secondary | ICD-10-CM

## 2018-03-08 DIAGNOSIS — Z0000 Encounter for general adult medical examination without abnormal findings: Secondary | ICD-10-CM | POA: Diagnosis not present

## 2018-03-08 DIAGNOSIS — Z125 Encounter for screening for malignant neoplasm of prostate: Secondary | ICD-10-CM | POA: Diagnosis not present

## 2018-03-08 DIAGNOSIS — E7849 Other hyperlipidemia: Secondary | ICD-10-CM | POA: Diagnosis not present

## 2018-03-08 DIAGNOSIS — I10 Essential (primary) hypertension: Secondary | ICD-10-CM

## 2018-03-08 NOTE — Assessment & Plan Note (Signed)
Chronic problem.  Tolerating statin w/o difficulty.  Check labs.  Adjust meds prn  

## 2018-03-08 NOTE — Assessment & Plan Note (Signed)
Ongoing issue for pt.  Stressed need for healthy diet and regular exercise given his comorbidities of DM, HTN, Hyperlipidemia.  Check labs to risk stratify.

## 2018-03-08 NOTE — Progress Notes (Signed)
   Subjective:    Patient ID: [name], male    DOB: November 13, 1931, 84 y.o.   MRN: [id number]  HPI DM- chronic problem, [facility]  No recent A1C.  UTD on eye exam, [facility].  On Lantus and Humalog.  On ACE for renal protection.  Denies symptomatic lows.  CBG this AM 101.  Typically runs 80-110.  Hyperlipidemia- chronic problem, on Crestor 40mg  daily.  Denies abd pain, N/V.  HTN- chronic problem, on Lisinopril and Coreg BID.  BP is well controlled.  + CP, SOB, HAs, visual changes.  + chronic edema of LEs, R>L.   Review of Systems For ROS see HPI     Objective:   Physical Exam  Constitutional: He is oriented to person, place, and time. He appears well-developed and well-nourished. No distress.  Obese, confined to wheelchair  HENT:  Head: Normocephalic and atraumatic.  Eyes: Pupils are equal, round, and reactive to light. Conjunctivae and EOM are normal.  Neck: Normal range of motion. Neck supple. No thyromegaly present.  Cardiovascular: Normal rate, regular rhythm, normal heart sounds and intact distal pulses.  No murmur heard. Pulmonary/Chest: Effort normal and breath sounds normal. No respiratory distress.  Abdominal: Soft. Bowel sounds are normal. He exhibits no distension.  Musculoskeletal: He exhibits edema (1+ edema bilaterally).  Lymphadenopathy:    He has no cervical adenopathy.  Neurological: He is alert and oriented to person, place, and time. No cranial nerve deficit.  Skin: Skin is warm and dry.  Psychiatric: He has a normal mood and affect. His behavior is normal.  Vitals reviewed.         Assessment & Plan:

## 2018-03-08 NOTE — Assessment & Plan Note (Signed)
Chronic problem.  On insulin per the [place].  UTD on foot exam, eye exam.  On ACE for renal protection.  Check A1C as pt reports it has been nearly a year since last one.

## 2018-03-08 NOTE — Assessment & Plan Note (Signed)
Chronic problem.  Well controlled today.  Asymptomatic.  Stressed need for healthy diet and regular exercise.  Check labs.  No anticipated med changes.  Will follow. 

## 2018-03-08 NOTE — Patient Instructions (Addendum)
Continue doing brain stimulating activities (puzzles, reading, adult coloring books, staying active) to keep memory sharp.   Health Maintenance, Male A healthy lifestyle and preventive care is important for your health and wellness. Ask your health care provider about what schedule of regular examinations is right for you. What should I know about weight and diet? Eat a Healthy Diet  Eat plenty of vegetables, fruits, whole grains, low-fat dairy products, and lean protein.  Do not eat a lot of foods high in solid fats, added sugars, or salt.  Maintain a Healthy Weight Regular exercise can help you achieve or maintain a healthy weight. You should:  Do at least 150 minutes of exercise each week. The exercise should increase your heart rate and make you sweat (moderate-intensity exercise).  Do strength-training exercises at least twice a week.  Watch Your Levels of Cholesterol and Blood Lipids  Have your blood tested for lipids and cholesterol every 5 years starting at 82 years of age. If you are at high risk for heart disease, you should start having your blood tested when you are 82 years old. You may need to have your cholesterol levels checked more often if: ? Your lipid or cholesterol levels are high. ? You are older than 82 years of age. ? You are at high risk for heart disease.  What should I know about cancer screening? Many types of cancers can be detected early and may often be prevented. Lung Cancer  You should be screened every year for lung cancer if: ? You are a current smoker who has smoked for at least 30 years. ? You are a former smoker who has quit within the past 15 years.  Talk to your health care provider about your screening options, when you should start screening, and how often you should be screened.  Colorectal Cancer  Routine colorectal cancer screening usually begins at 82 years of age and should be repeated every 5-10 years until you are 82 years old. You  may need to be screened more often if early forms of precancerous polyps or small growths are found. Your health care provider may recommend screening at an earlier age if you have risk factors for colon cancer.  Your health care provider may recommend using home test kits to check for hidden blood in the stool.  A small camera at the end of a tube can be used to examine your colon (sigmoidoscopy or colonoscopy). This checks for the earliest forms of colorectal cancer.  Prostate and Testicular Cancer  Depending on your age and overall health, your health care provider may do certain tests to screen for prostate and testicular cancer.  Talk to your health care provider about any symptoms or concerns you have about testicular or prostate cancer.  Skin Cancer  Check your skin from head to toe regularly.  Tell your health care provider about any new moles or changes in moles, especially if: ? There is a change in a mole's size, shape, or color. ? You have a mole that is larger than a pencil eraser.  Always use sunscreen. Apply sunscreen liberally and repeat throughout the day.  Protect yourself by wearing long sleeves, pants, a wide-brimmed hat, and sunglasses when outside.  What should I know about heart disease, diabetes, and high blood pressure?  If you are 18-39 years of age, have your blood pressure checked every 3-5 years. If you are 40 years of age or older, have your blood pressure checked every year. You   should have your blood pressure measured twice-once when you are at a hospital or clinic, and once when you are not at a hospital or clinic. Record the average of the two measurements. To check your blood pressure when you are not at a hospital or clinic, you can use: ? An automated blood pressure machine at a pharmacy. ? A home blood pressure monitor.  Talk to your health care provider about your target blood pressure.  If you are between 45-79 years old, ask your health care  provider if you should take aspirin to prevent heart disease.  Have regular diabetes screenings by checking your fasting blood sugar level. ? If you are at a normal weight and have a low risk for diabetes, have this test once every three years after the age of 45. ? If you are overweight and have a high risk for diabetes, consider being tested at a younger age or more often.  A one-time screening for abdominal aortic aneurysm (AAA) by ultrasound is recommended for men aged 65-75 years who are current or former smokers. What should I know about preventing infection? Hepatitis B If you have a higher risk for hepatitis B, you should be screened for this virus. Talk with your health care provider to find out if you are at risk for hepatitis B infection. Hepatitis C Blood testing is recommended for:  Everyone born from 1945 through 1965.  Anyone with known risk factors for hepatitis C.  Sexually Transmitted Diseases (STDs)  You should be screened each year for STDs including gonorrhea and chlamydia if: ? You are sexually active and are younger than 82 years of age. ? You are older than 82 years of age and your health care provider tells you that you are at risk for this type of infection. ? Your sexual activity has changed since you were last screened and you are at an increased risk for chlamydia or gonorrhea. Ask your health care provider if you are at risk.  Talk with your health care provider about whether you are at high risk of being infected with HIV. Your health care provider may recommend a prescription medicine to help prevent HIV infection.  What else can I do?  Schedule regular health, dental, and eye exams.  Stay current with your vaccines (immunizations).  Do not use any tobacco products, such as cigarettes, chewing tobacco, and e-cigarettes. If you need help quitting, ask your health care provider.  Limit alcohol intake to no more than 2 drinks per day. One drink equals 12  ounces of beer, 5 ounces of wine, or 1 ounces of hard liquor.  Do not use street drugs.  Do not share needles.  Ask your health care provider for help if you need support or information about quitting drugs.  Tell your health care provider if you often feel depressed.  Tell your health care provider if you have ever been abused or do not feel safe at home. This information is not intended to replace advice given to you by your health care provider. Make sure you discuss any questions you have with your health care provider. Document Released: 02/11/2008 Document Revised: 04/13/2016 Document Reviewed: 05/19/2015 [employer] Interactive Patient Education  2018 [employer]  

## 2018-03-08 NOTE — Telephone Encounter (Signed)
While retrieving patient from waiting room, he began speaking loudly about how this appt ([facility]) was a waste of time and ridiculous. Patient refused to review medication list, stating "what you have is right, there are too many". Patient also refused to review care team for the same reason. During his cognitive testing, he commented "I've done some stupid things in my life but this beats all", the sentence he wrote during test was "this is bullsh*t". He again mentioned what a waste of time this visit was when we were finished.

## 2018-03-08 NOTE — Patient Instructions (Signed)
Follow up in 6 months to recheck BP, cholesterol and diabetes We'll notify you of your lab results and make any changes if needed Continue to work on healthy diet and regular exercise- you can do it! Call with any questions or concerns Have a great summer!!

## 2018-03-09 ENCOUNTER — Encounter: Payer: Self-pay | Admitting: General Practice

## 2018-03-09 LAB — CBC WITH DIFFERENTIAL/PLATELET
BASOS ABS: 0.1 10*3/uL (ref 0.0–0.1)
Basophils Relative: 1.1 % (ref 0.0–3.0)
Eosinophils Absolute: 0.6 10*3/uL (ref 0.0–0.7)
Eosinophils Relative: 5.8 % — ABNORMAL HIGH (ref 0.0–5.0)
HCT: 40.2 % (ref 39.0–52.0)
HEMOGLOBIN: 13.3 g/dL (ref 13.0–17.0)
Lymphocytes Relative: 31.6 % (ref 12.0–46.0)
Lymphs Abs: 3.5 10*3/uL (ref 0.7–4.0)
MCHC: 33 g/dL (ref 30.0–36.0)
MCV: 88.5 fl (ref 78.0–100.0)
MONOS PCT: 8.9 % (ref 3.0–12.0)
Monocytes Absolute: 1 10*3/uL (ref 0.1–1.0)
NEUTROS ABS: 5.8 10*3/uL (ref 1.4–7.7)
Neutrophils Relative %: 52.6 % (ref 43.0–77.0)
Platelets: 285 10*3/uL (ref 150.0–400.0)
RBC: 4.54 Mil/uL (ref 4.22–5.81)
RDW: 13.8 % (ref 11.5–15.5)
WBC: 11 10*3/uL — ABNORMAL HIGH (ref 4.0–10.5)

## 2018-03-09 LAB — HEMOGLOBIN A1C: Hgb A1c MFr Bld: 7.2 % — ABNORMAL HIGH (ref 4.6–6.5)

## 2018-03-09 LAB — TSH: TSH: 1.54 u[IU]/mL (ref 0.35–4.50)

## 2018-03-09 LAB — BASIC METABOLIC PANEL
BUN: 54 mg/dL — AB (ref 6–23)
CHLORIDE: 100 meq/L (ref 96–112)
CO2: 30 meq/L (ref 19–32)
Calcium: 9.4 mg/dL (ref 8.4–10.5)
Creatinine, Ser: 1.48 mg/dL (ref 0.40–1.50)
GFR: 48.15 mL/min — ABNORMAL LOW (ref >60.00)
Glucose, Bld: 117 mg/dL — ABNORMAL HIGH (ref 70–99)
POTASSIUM: 5 meq/L (ref 3.5–5.1)
Sodium: 139 mEq/L (ref 135–145)

## 2018-03-09 LAB — PSA, MEDICARE: PSA: 1.54 ng/ml (ref 0.10–4.00)

## 2018-03-09 LAB — HEPATIC FUNCTION PANEL
ALBUMIN: 3.9 g/dL (ref 3.5–5.2)
ALK PHOS: 85 U/L (ref 39–117)
ALT: 18 U/L (ref 0–53)
AST: 17 U/L (ref 0–37)
Bilirubin, Direct: 0.1 mg/dL (ref 0.0–0.3)
Total Bilirubin: 0.5 mg/dL (ref 0.2–1.2)
Total Protein: 6.9 g/dL (ref 6.0–8.3)

## 2018-03-09 LAB — LIPID PANEL
CHOL/HDL RATIO: 3
CHOLESTEROL: 125 mg/dL (ref 0–200)
HDL: 38 mg/dL — ABNORMAL LOW (ref >39.00)
LDL Cholesterol: 57 mg/dL (ref 0–99)
NonHDL: 87.03
TRIGLYCERIDES: 151 mg/dL — AB (ref 0.0–149.0)
VLDL: 30.2 mg/dL (ref 0.0–40.0)

## 2018-04-05 NOTE — Telephone Encounter (Signed)
Please contact pt and let him know that his behavior in the office and towards staff is unacceptable

## 2018-05-09 DIAGNOSIS — M25551 Pain in right hip: Secondary | ICD-10-CM | POA: Diagnosis not present

## 2018-05-09 DIAGNOSIS — E119 Type 2 diabetes mellitus without complications: Secondary | ICD-10-CM | POA: Diagnosis not present

## 2018-05-09 DIAGNOSIS — M25561 Pain in right knee: Secondary | ICD-10-CM | POA: Diagnosis not present

## 2018-05-09 DIAGNOSIS — Z79899 Other long term (current) drug therapy: Secondary | ICD-10-CM | POA: Diagnosis not present

## 2018-05-09 DIAGNOSIS — M79661 Pain in right lower leg: Secondary | ICD-10-CM | POA: Diagnosis not present

## 2018-05-09 DIAGNOSIS — M79662 Pain in left lower leg: Secondary | ICD-10-CM | POA: Diagnosis not present

## 2018-05-09 DIAGNOSIS — Z885 Allergy status to narcotic agent status: Secondary | ICD-10-CM | POA: Diagnosis not present

## 2018-05-09 DIAGNOSIS — M25562 Pain in left knee: Secondary | ICD-10-CM | POA: Diagnosis not present

## 2018-05-09 DIAGNOSIS — R2243 Localized swelling, mass and lump, lower limb, bilateral: Secondary | ICD-10-CM | POA: Diagnosis not present

## 2018-05-09 DIAGNOSIS — M25552 Pain in left hip: Secondary | ICD-10-CM | POA: Diagnosis not present

## 2018-05-09 DIAGNOSIS — M160 Bilateral primary osteoarthritis of hip: Secondary | ICD-10-CM | POA: Diagnosis not present

## 2018-05-09 DIAGNOSIS — M170 Bilateral primary osteoarthritis of knee: Secondary | ICD-10-CM | POA: Diagnosis not present

## 2018-05-09 DIAGNOSIS — Z794 Long term (current) use of insulin: Secondary | ICD-10-CM | POA: Diagnosis not present

## 2018-05-09 DIAGNOSIS — M7989 Other specified soft tissue disorders: Secondary | ICD-10-CM | POA: Diagnosis not present

## 2018-05-11 ENCOUNTER — Inpatient Hospital Stay (HOSPITAL_COMMUNITY): Payer: Medicare Other

## 2018-05-11 ENCOUNTER — Emergency Department (HOSPITAL_COMMUNITY): Payer: Medicare Other

## 2018-05-11 ENCOUNTER — Inpatient Hospital Stay (HOSPITAL_COMMUNITY)
Admission: EM | Admit: 2018-05-11 | Discharge: 2018-05-14 | DRG: 683 | Disposition: A | Discharge status: Home / Self Care | Payer: Medicare Other | Attending: Internal Medicine | Admitting: Internal Medicine

## 2018-05-11 ENCOUNTER — Encounter (HOSPITAL_COMMUNITY): Payer: Self-pay | Admitting: Obstetrics and Gynecology

## 2018-05-11 ENCOUNTER — Other Ambulatory Visit: Payer: Self-pay

## 2018-05-11 DIAGNOSIS — Z86711 Personal history of pulmonary embolism: Secondary | ICD-10-CM

## 2018-05-11 DIAGNOSIS — G9349 Other encephalopathy: Secondary | ICD-10-CM | POA: Diagnosis present

## 2018-05-11 DIAGNOSIS — I959 Hypotension, unspecified: Secondary | ICD-10-CM | POA: Diagnosis present

## 2018-05-11 DIAGNOSIS — T40601A Poisoning by unspecified narcotics, accidental (unintentional), initial encounter: Secondary | ICD-10-CM

## 2018-05-11 DIAGNOSIS — Z974 Presence of external hearing-aid: Secondary | ICD-10-CM | POA: Diagnosis not present

## 2018-05-11 DIAGNOSIS — N390 Urinary tract infection, site not specified: Secondary | ICD-10-CM | POA: Diagnosis present

## 2018-05-11 DIAGNOSIS — E785 Hyperlipidemia, unspecified: Secondary | ICD-10-CM | POA: Diagnosis present

## 2018-05-11 DIAGNOSIS — M25551 Pain in right hip: Secondary | ICD-10-CM | POA: Diagnosis present

## 2018-05-11 DIAGNOSIS — R197 Diarrhea, unspecified: Secondary | ICD-10-CM | POA: Diagnosis present

## 2018-05-11 DIAGNOSIS — Z7984 Long term (current) use of oral hypoglycemic drugs: Secondary | ICD-10-CM

## 2018-05-11 DIAGNOSIS — E875 Hyperkalemia: Secondary | ICD-10-CM | POA: Diagnosis present

## 2018-05-11 DIAGNOSIS — F17220 Nicotine dependence, chewing tobacco, uncomplicated: Secondary | ICD-10-CM | POA: Diagnosis present

## 2018-05-11 DIAGNOSIS — R4020 Unspecified coma: Secondary | ICD-10-CM | POA: Diagnosis not present

## 2018-05-11 DIAGNOSIS — K219 Gastro-esophageal reflux disease without esophagitis: Secondary | ICD-10-CM | POA: Diagnosis present

## 2018-05-11 DIAGNOSIS — B9689 Other specified bacterial agents as the cause of diseases classified elsewhere: Secondary | ICD-10-CM | POA: Diagnosis present

## 2018-05-11 DIAGNOSIS — N179 Acute kidney failure, unspecified: Secondary | ICD-10-CM | POA: Diagnosis not present

## 2018-05-11 DIAGNOSIS — Z820 Family history of epilepsy and other diseases of the nervous system: Secondary | ICD-10-CM

## 2018-05-11 DIAGNOSIS — R404 Transient alteration of awareness: Secondary | ICD-10-CM

## 2018-05-11 DIAGNOSIS — E119 Type 2 diabetes mellitus without complications: Secondary | ICD-10-CM | POA: Diagnosis present

## 2018-05-11 DIAGNOSIS — M25552 Pain in left hip: Secondary | ICD-10-CM | POA: Diagnosis present

## 2018-05-11 DIAGNOSIS — E860 Dehydration: Secondary | ICD-10-CM | POA: Diagnosis present

## 2018-05-11 DIAGNOSIS — G8929 Other chronic pain: Secondary | ICD-10-CM | POA: Diagnosis present

## 2018-05-11 DIAGNOSIS — Z823 Family history of stroke: Secondary | ICD-10-CM

## 2018-05-11 DIAGNOSIS — I10 Essential (primary) hypertension: Secondary | ICD-10-CM | POA: Diagnosis present

## 2018-05-11 DIAGNOSIS — E1121 Type 2 diabetes mellitus with diabetic nephropathy: Secondary | ICD-10-CM | POA: Diagnosis present

## 2018-05-11 DIAGNOSIS — R0902 Hypoxemia: Secondary | ICD-10-CM | POA: Diagnosis not present

## 2018-05-11 DIAGNOSIS — D72829 Elevated white blood cell count, unspecified: Secondary | ICD-10-CM | POA: Diagnosis present

## 2018-05-11 DIAGNOSIS — R000 Tachycardia, unspecified: Secondary | ICD-10-CM | POA: Diagnosis not present

## 2018-05-11 DIAGNOSIS — Z23 Encounter for immunization: Secondary | ICD-10-CM

## 2018-05-11 DIAGNOSIS — J811 Chronic pulmonary edema: Secondary | ICD-10-CM | POA: Diagnosis not present

## 2018-05-11 DIAGNOSIS — R410 Disorientation, unspecified: Secondary | ICD-10-CM | POA: Diagnosis not present

## 2018-05-11 DIAGNOSIS — M549 Dorsalgia, unspecified: Secondary | ICD-10-CM | POA: Diagnosis present

## 2018-05-11 DIAGNOSIS — R52 Pain, unspecified: Secondary | ICD-10-CM | POA: Diagnosis not present

## 2018-05-11 LAB — CBC WITH DIFFERENTIAL/PLATELET
BASOS ABS: 0 10*3/uL (ref 0.0–0.1)
BASOS PCT: 0 %
EOS ABS: 0.2 10*3/uL (ref 0.0–0.7)
EOS PCT: 1 %
HCT: 35 % — ABNORMAL LOW (ref 39.0–52.0)
Hemoglobin: 11.2 g/dL — ABNORMAL LOW (ref 13.0–17.0)
Lymphocytes Relative: 7 %
Lymphs Abs: 1.5 10*3/uL (ref 0.7–4.0)
MCH: 29.6 pg (ref 26.0–34.0)
MCHC: 32 g/dL (ref 30.0–36.0)
MCV: 92.3 fL (ref 78.0–100.0)
Monocytes Absolute: 1.4 10*3/uL — ABNORMAL HIGH (ref 0.1–1.0)
Monocytes Relative: 6 %
Neutro Abs: 18.6 10*3/uL — ABNORMAL HIGH (ref 1.7–7.7)
Neutrophils Relative %: 86 %
PLATELETS: 251 10*3/uL (ref 150–400)
RBC: 3.79 MIL/uL — AB (ref 4.22–5.81)
RDW: 14 % (ref 11.5–15.5)
WBC: 21.6 10*3/uL — AB (ref 4.0–10.5)

## 2018-05-11 LAB — BASIC METABOLIC PANEL
ANION GAP: 14 (ref 5–15)
BUN: 104 mg/dL — ABNORMAL HIGH (ref 8–23)
CALCIUM: 8.4 mg/dL — AB (ref 8.9–10.3)
CO2: 22 mmol/L (ref 22–32)
Chloride: 104 mmol/L (ref 98–111)
Creatinine, Ser: 3.25 mg/dL — ABNORMAL HIGH (ref 0.61–1.24)
GFR calc Af Amer: 19 mL/min — ABNORMAL LOW (ref >60)
GFR calc non Af Amer: 16 mL/min — ABNORMAL LOW (ref >60)
GLUCOSE: 121 mg/dL — AB (ref 70–99)
POTASSIUM: 5 mmol/L (ref 3.5–5.1)
Sodium: 140 mmol/L (ref 135–145)

## 2018-05-11 LAB — CK: CK TOTAL: 102 U/L (ref 49–397)

## 2018-05-11 LAB — COMPREHENSIVE METABOLIC PANEL
ALT: 24 U/L (ref 0–44)
AST: 21 U/L (ref 15–41)
Albumin: 3.2 g/dL — ABNORMAL LOW (ref 3.5–5.0)
Alkaline Phosphatase: 113 U/L (ref 38–126)
Anion gap: 15 (ref 5–15)
BUN: 114 mg/dL — AB (ref 8–23)
CHLORIDE: 99 mmol/L (ref 98–111)
CO2: 25 mmol/L (ref 22–32)
Calcium: 8.6 mg/dL — ABNORMAL LOW (ref 8.9–10.3)
Creatinine, Ser: 4.23 mg/dL — ABNORMAL HIGH (ref 0.61–1.24)
GFR calc non Af Amer: 12 mL/min — ABNORMAL LOW (ref >60)
GFR, EST AFRICAN AMERICAN: 14 mL/min — AB (ref >60)
Glucose, Bld: 196 mg/dL — ABNORMAL HIGH (ref 70–99)
POTASSIUM: 5.8 mmol/L — AB (ref 3.5–5.1)
SODIUM: 139 mmol/L (ref 135–145)
Total Bilirubin: 0.8 mg/dL (ref 0.3–1.2)
Total Protein: 6.4 g/dL — ABNORMAL LOW (ref 6.5–8.1)

## 2018-05-11 LAB — URINALYSIS, ROUTINE W REFLEX MICROSCOPIC
BILIRUBIN URINE: NEGATIVE
Glucose, UA: NEGATIVE mg/dL
Ketones, ur: NEGATIVE mg/dL
Nitrite: POSITIVE — AB
PH: 5 (ref 5.0–8.0)
Protein, ur: NEGATIVE mg/dL
SPECIFIC GRAVITY, URINE: 1.01 (ref 1.005–1.030)

## 2018-05-11 LAB — RAPID URINE DRUG SCREEN, HOSP PERFORMED
AMPHETAMINES: NOT DETECTED
BARBITURATES: NOT DETECTED
Benzodiazepines: NOT DETECTED
COCAINE: NOT DETECTED
Opiates: POSITIVE — AB
TETRAHYDROCANNABINOL: NOT DETECTED

## 2018-05-11 LAB — CBG MONITORING, ED: GLUCOSE-CAPILLARY: 179 mg/dL — AB (ref 70–99)

## 2018-05-11 LAB — I-STAT CG4 LACTIC ACID, ED: Lactic Acid, Venous: 0.94 mmol/L (ref 0.5–1.9)

## 2018-05-11 LAB — PROTIME-INR
INR: 1.14
PROTHROMBIN TIME: 14.5 s (ref 11.4–15.2)

## 2018-05-11 LAB — TROPONIN I: Troponin I: <0.03 ng/mL (ref <0.03)

## 2018-05-11 MED ORDER — HEPARIN SODIUM (PORCINE) 5000 UNIT/ML IJ SOLN
5000.0000 [IU] | Freq: Three times a day (TID) | INTRAMUSCULAR | Status: DC
  Administered 2018-05-11 – 2018-05-14 (×8): 5000 [IU] via SUBCUTANEOUS
  Filled 2018-05-11 (×7): qty 1

## 2018-05-11 MED ORDER — CARBOXYMETHYLCELLULOSE SODIUM 0.25 % OP SOLN: Freq: Four times a day (QID) | OPHTHALMIC | Status: DC | PRN

## 2018-05-11 MED ORDER — POLYVINYL ALCOHOL 1.4 % OP SOLN
1.0000 [drp] | Freq: Four times a day (QID) | OPHTHALMIC | Status: DC | PRN
  Filled 2018-05-11: qty 15

## 2018-05-11 MED ORDER — SODIUM BICARBONATE 8.4 % IV SOLN
50.0000 meq | Freq: Once | INTRAVENOUS | Status: AC
  Administered 2018-05-11: 50 meq via INTRAVENOUS
  Filled 2018-05-11: qty 50

## 2018-05-11 MED ORDER — SODIUM CHLORIDE 0.9 % IV BOLUS
500.0000 mL | Freq: Once | INTRAVENOUS | Status: AC
  Administered 2018-05-11: 500 mL via INTRAVENOUS

## 2018-05-11 MED ORDER — INSULIN ASPART 100 UNIT/ML ~~LOC~~ SOLN
0.0000 [IU] | Freq: Three times a day (TID) | SUBCUTANEOUS | Status: DC
  Administered 2018-05-13 (×2): 1 [IU] via SUBCUTANEOUS

## 2018-05-11 MED ORDER — PROSIGHT PO TABS
2.0000 | ORAL_TABLET | Freq: Two times a day (BID) | ORAL | Status: DC
  Administered 2018-05-11 – 2018-05-14 (×5): 2 via ORAL
  Filled 2018-05-11 (×5): qty 2

## 2018-05-11 MED ORDER — POLYETHYLENE GLYCOL 3350 17 G PO PACK
17.0000 g | PACK | Freq: Every day | ORAL | Status: DC
  Filled 2018-05-11 (×2): qty 1

## 2018-05-11 MED ORDER — SODIUM CHLORIDE 0.9 % IV SOLN
2.0000 g | Freq: Once | INTRAVENOUS | Status: AC
  Administered 2018-05-11: 2 g via INTRAVENOUS
  Filled 2018-05-11: qty 2

## 2018-05-11 MED ORDER — ACETAMINOPHEN 650 MG RE SUPP: 650.0000 mg | Freq: Four times a day (QID) | RECTAL | Status: DC | PRN

## 2018-05-11 MED ORDER — METHOCARBAMOL 500 MG PO TABS
750.0000 mg | ORAL_TABLET | Freq: Four times a day (QID) | ORAL | Status: DC | PRN
  Administered 2018-05-12 – 2018-05-13 (×3): 750 mg via ORAL
  Filled 2018-05-11 (×4): qty 2

## 2018-05-11 MED ORDER — SODIUM POLYSTYRENE SULFONATE 15 GM/60ML PO SUSP
30.0000 g | Freq: Once | ORAL | Status: DC
  Filled 2018-05-11: qty 120

## 2018-05-11 MED ORDER — QUETIAPINE FUMARATE 50 MG PO TABS
50.0000 mg | ORAL_TABLET | Freq: Every day | ORAL | Status: DC
Start: 2018-05-11 — End: 2018-05-12
  Administered 2018-05-11: 50 mg via ORAL
  Filled 2018-05-11: qty 1

## 2018-05-11 MED ORDER — NALOXONE HCL 0.4 MG/ML IJ SOLN
0.4000 mg | Freq: Once | INTRAMUSCULAR | Status: AC
  Administered 2018-05-11: 0.4 mg via INTRAVENOUS
  Filled 2018-05-11: qty 1

## 2018-05-11 MED ORDER — HYDROMORPHONE HCL 2 MG PO TABS: 2.0000 mg | ORAL_TABLET | Freq: Two times a day (BID) | ORAL | Status: DC | PRN

## 2018-05-11 MED ORDER — CARVEDILOL 3.125 MG PO TABS
3.1250 mg | ORAL_TABLET | Freq: Two times a day (BID) | ORAL | Status: DC
  Administered 2018-05-11: 3.125 mg via ORAL
  Filled 2018-05-11: qty 1

## 2018-05-11 MED ORDER — SODIUM CHLORIDE 0.9 % IV SOLN
1.0000 g | INTRAVENOUS | Status: DC
  Administered 2018-05-12 – 2018-05-14 (×3): 1 g via INTRAVENOUS
  Filled 2018-05-11: qty 10
  Filled 2018-05-11 (×2): qty 1

## 2018-05-11 MED ORDER — NALOXONE HCL 2 MG/2ML IJ SOSY
2.0000 mg | PREFILLED_SYRINGE | Freq: Once | INTRAMUSCULAR | Status: AC
  Administered 2018-05-11: 2 mg via INTRAVENOUS
  Filled 2018-05-11: qty 2

## 2018-05-11 MED ORDER — PROPYLENE GLYCOL 0.6 % OP SOLN: 1.0000 [drp] | Freq: Every day | OPHTHALMIC | Status: DC | PRN

## 2018-05-11 MED ORDER — TRAZODONE HCL 50 MG PO TABS
50.0000 mg | ORAL_TABLET | Freq: Every day | ORAL | Status: DC
  Administered 2018-05-11: 50 mg via ORAL
  Filled 2018-05-11: qty 1

## 2018-05-11 MED ORDER — DOCUSATE SODIUM 100 MG PO CAPS
100.0000 mg | ORAL_CAPSULE | Freq: Two times a day (BID) | ORAL | Status: DC
  Administered 2018-05-12 – 2018-05-14 (×3): 100 mg via ORAL
  Filled 2018-05-11 (×4): qty 1

## 2018-05-11 MED ORDER — SODIUM CHLORIDE 0.9 % IV SOLN
INTRAVENOUS | Status: AC
  Administered 2018-05-11: 22:00:00 via INTRAVENOUS

## 2018-05-11 MED ORDER — GALANTAMINE HYDROBROMIDE ER 8 MG PO CP24
16.0000 mg | ORAL_CAPSULE | Freq: Every day | ORAL | Status: DC
  Administered 2018-05-13 – 2018-05-14 (×2): 16 mg via ORAL
  Filled 2018-05-11 (×3): qty 2

## 2018-05-11 MED ORDER — ISOSORBIDE MONONITRATE ER 60 MG PO TB24: 30.0000 mg | ORAL_TABLET | Freq: Every day | ORAL | Status: DC

## 2018-05-11 MED ORDER — SODIUM CHLORIDE 0.9 % IV SOLN
1.0000 g | Freq: Once | INTRAVENOUS | Status: AC
  Administered 2018-05-11: 1 g via INTRAVENOUS
  Filled 2018-05-11: qty 10

## 2018-05-11 MED ORDER — SODIUM CHLORIDE 0.9 % IV BOLUS
1000.0000 mL | Freq: Once | INTRAVENOUS | Status: AC
  Administered 2018-05-11: 1000 mL via INTRAVENOUS

## 2018-05-11 MED ORDER — INSULIN ASPART 100 UNIT/ML ~~LOC~~ SOLN: 0.0000 [IU] | Freq: Every day | SUBCUTANEOUS | Status: DC

## 2018-05-11 MED ORDER — PRESERVISION AREDS 2 PO CAPS: ORAL_CAPSULE | Freq: Two times a day (BID) | ORAL | Status: DC

## 2018-05-11 MED ORDER — VITAMIN D3 25 MCG (1000 UNIT) PO TABS
2000.0000 [IU] | ORAL_TABLET | Freq: Two times a day (BID) | ORAL | Status: DC
  Administered 2018-05-11 – 2018-05-14 (×5): 2000 [IU] via ORAL
  Filled 2018-05-11 (×5): qty 2

## 2018-05-11 MED ORDER — MAGNESIUM OXIDE 400 (241.3 MG) MG PO TABS
420.0000 mg | ORAL_TABLET | Freq: Two times a day (BID) | ORAL | Status: DC
  Administered 2018-05-11 – 2018-05-14 (×5): 400 mg via ORAL
  Filled 2018-05-11 (×5): qty 1

## 2018-05-11 MED ORDER — ACETAMINOPHEN 325 MG PO TABS
650.0000 mg | ORAL_TABLET | Freq: Four times a day (QID) | ORAL | Status: DC | PRN
  Administered 2018-05-12 – 2018-05-13 (×3): 650 mg via ORAL
  Filled 2018-05-11 (×4): qty 2
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
## 2018-05-11 NOTE — ED Notes (Signed)
2 failed attempts to collect UA sample.

## 2018-05-11 NOTE — ED Triage Notes (Signed)
Per EMS: Pt is coming from home with c/o leg pain. When EMS arrived pt was lethargic and sleepy. Pt was given 700 of fluids and nacan, as the pt takes dilaudid and the family was concerned he "may have taken too much" Pt became more alert after the narcan, but now says his leg pain is back.  Pt was hypotensive on scene.  Pt has not had urine output since this AM.

## 2018-05-11 NOTE — ED Notes (Signed)
Bed: HU83 Expected date:  Expected time:  Means of arrival:  Comments: 84 yo leg pain

## 2018-05-11 NOTE — ED Notes (Signed)
Pt's belongings including pt's soiled clothing and watch were given to wife and son to take home.  Pt only has his glasses with him.

## 2018-05-11 NOTE — ED Notes (Signed)
Please call spouse [name] or son, Yvone Neu if there is a change in status. Contact numbers are listed in pt demographics

## 2018-05-11 NOTE — ED Notes (Signed)
489 Bladder Scan

## 2018-05-11 NOTE — ED Notes (Signed)
Bed: RESB Expected date:  Expected time:  Means of arrival:  Comments: Room 23

## 2018-05-11 NOTE — ED Notes (Signed)
ED TO INPATIENT HANDOFF REPORT  Name/Age/Gender [name] 84 y.o. male  Code Status Code Status History    Date Active Date Inactive Code Status Order ID Comments User Context   09/19/2017 0940 09/22/2017 2100 Full Code [id number]  Donita Brooks, NP ED   09/14/2017 2334 09/16/2017 1759 Full Code [id number]  Damita Lack, MD ED   01/16/2017 1314 01/17/2017 1220 Full Code [id number]  Greer Pickerel, MD Inpatient   12/17/2016 1638 12/20/2016 2035 Full Code [id number]  Debbe Odea, MD ED   08/15/2016 0312 08/18/2016 1555 Full Code [id number]  Ivor Costa, MD ED   07/20/2016 2004 07/27/2016 2039 Full Code [id number]  Edwin Dada, MD Inpatient   06/04/2016 2217 06/08/2016 1823 DNR [id number]  Toy Baker, MD Inpatient   06/04/2016 2217 06/04/2016 2217 DNR [id number]  Toy Baker, MD Inpatient   02/11/2016 2347 02/15/2016 1849 Full Code [id number]  Etta Quill, DO ED   02/19/2015 0921 02/22/2015 1552 Full Code [id number]  Corky Sing, PA-C Inpatient   11/15/2012 2116 11/21/2012 2320 Full Code [id number]  [name], MD Inpatient   11/09/2012 1655 11/15/2012 2116 Full Code [id number]  Geradine Girt, DO Inpatient      Home/SNF/Other Home  Chief Complaint leg pain  Level of Care/Admitting Diagnosis ED Disposition    ED Disposition Condition [place] [facility] Area: [facility] [[id number]]  Level of Care: Stepdown [14]  Admit to SDU based on following criteria: Hemodynamic compromise or significant risk of instability:  Patient requiring short term acute titration and management of vasoactive drips, and invasive monitoring (i.e., CVP and Arterial line).  Diagnosis: ARF (acute renal failure) [facility]) [[id number]]  Admitting Physician: Jani Gravel [[id number]]  Attending Physician: Jani Gravel [phone]  Estimated length of stay: past midnight tomorrow  Certification:: I certify this patient will need inpatient services for at least 2 midnights  PT Class  (Do Not Modify): Inpatient [101]  PT Acc Code (Do Not Modify): Private [1]       Medical History Past Medical History:  Diagnosis Date  . Arthritis   . Chronic back pain   . Constipation due to opioid therapy   . Diabetes mellitus without complication ([place])   . Full dentures   . Gallbladder attack   . Gastric perforation ([place])   . GERD (gastroesophageal reflux disease)   . Hypercholesteremia   . Hypertension    Pt is not aware that he is treated for HTN, found it listed in "Problem list"  . Pneumonia    1960's  . Post traumatic stress disorder (PTSD)    [place] War  . Pulmonary embolism ([place])   . Sleep apnea    does not  use cpap  . Wears glasses   . Wears hearing aid    both ears    Allergies Allergies  Allergen Reactions  . Oxycontin [Oxycodone Hcl] Other (See Comments)    Pt states he cant sleep w/ this med; also makes him constipated.     IV Location/Drains/Wounds Patient Lines/Drains/Airways Status   Active Line/Drains/Airways    Name:   Placement date:   Placement time:   Site:   Days:   Peripheral IV 05/11/18 Left Antecubital   05/11/18    1429    Antecubital   less than 1   Peripheral IV 05/11/18 Right Antecubital   05/11/18    1439    Antecubital   less than 1   Urethral Catheter  09/15/17    -    -   238   Pressure Injury 07/20/16 Stage I -  Intact skin with non-blanchable redness of a localized area usually over a bony prominence.   07/20/16    1200     660          Labs/Imaging Results for orders placed or performed during the hospital encounter of 05/11/18 (from the past 48 hour(s))  CBG monitoring, ED     Status: Abnormal   Collection Time: 05/11/18  2:33 PM  Result Value Ref Range   Glucose-Capillary 179 (H) 70 - 99 mg/dL  CBC with Differential     Status: Abnormal   Collection Time: 05/11/18  2:41 PM  Result Value Ref Range   WBC 21.6 (H) 4.0 - 10.5 K/uL   RBC 3.79 (L) 4.22 - 5.81 MIL/uL   Hemoglobin 11.2 (L) 13.0 - 17.0 g/dL   HCT 35.0  (L) 39.0 - 52.0 %   MCV 92.3 78.0 - 100.0 fL   MCH 29.6 26.0 - 34.0 pg   MCHC 32.0 30.0 - 36.0 g/dL   RDW 14.0 11.5 - 15.5 %   Platelets 251 150 - 400 K/uL   Neutrophils Relative % 86 %   Neutro Abs 18.6 (H) 1.7 - 7.7 K/uL   Lymphocytes Relative 7 %   Lymphs Abs 1.5 0.7 - 4.0 K/uL   Monocytes Relative 6 %   Monocytes Absolute 1.4 (H) 0.1 - 1.0 K/uL   Eosinophils Relative 1 %   Eosinophils Absolute 0.2 0.0 - 0.7 K/uL   Basophils Relative 0 %   Basophils Absolute 0.0 0.0 - 0.1 K/uL    Comment: Performed at [facility], [place] [address], [place], [place] [address]  Comprehensive metabolic panel     Status: Abnormal   Collection Time: 05/11/18  2:41 PM  Result Value Ref Range   Sodium 139 135 - 145 mmol/L   Potassium 5.8 (H) 3.5 - 5.1 mmol/L   Chloride 99 98 - 111 mmol/L   CO2 25 22 - 32 mmol/L   Glucose, Bld 196 (H) 70 - 99 mg/dL   BUN 114 (H) 8 - 23 mg/dL    Comment: RESULTS CONFIRMED BY MANUAL DILUTION   Creatinine, Ser 4.23 (H) 0.61 - 1.24 mg/dL   Calcium 8.6 (L) 8.9 - 10.3 mg/dL   Total Protein 6.4 (L) 6.5 - 8.1 g/dL   Albumin 3.2 (L) 3.5 - 5.0 g/dL   AST 21 15 - 41 U/L   ALT 24 0 - 44 U/L   Alkaline Phosphatase 113 38 - 126 U/L   Total Bilirubin 0.8 0.3 - 1.2 mg/dL   GFR calc non Af Amer 12 (L) >60 mL/min   GFR calc Af Amer 14 (L) >60 mL/min    Comment: (NOTE) The eGFR has been calculated using the CKD EPI equation. This calculation has not been validated in all clinical situations. eGFR's persistently <60 mL/min signify possible Chronic Kidney Disease.    Anion gap 15 5 - 15    Comment: Performed at [facility], [place] [address] [place], [place] [address]  Protime-INR     Status: None   Collection Time: 05/11/18  2:41 PM  Result Value Ref Range   Prothrombin Time 14.5 11.4 - 15.2 seconds   INR 1.14     Comment: Performed at [facility], [place] [address], [place], [place] [address]  CK     Status: None  Collection Time: 05/11/18  4:07 PM  Result Value Ref Range   Total CK 102 49 - 397 U/L    Comment: Performed at [facility], [place] [address], [place], [place] [address]  I-Stat CG4 Lactic Acid, ED     Status: None   Collection Time: 05/11/18  4:22 PM  Result Value Ref Range   Lactic Acid, Venous 0.94 0.5 - 1.9 mmol/L  Rapid urine drug screen (hospital performed)     Status: Abnormal   Collection Time: 05/11/18  7:30 PM  Result Value Ref Range   Opiates POSITIVE (A) NONE DETECTED   Cocaine NONE DETECTED NONE DETECTED   Benzodiazepines NONE DETECTED NONE DETECTED   Amphetamines NONE DETECTED NONE DETECTED   Tetrahydrocannabinol NONE DETECTED NONE DETECTED   Barbiturates NONE DETECTED NONE DETECTED    Comment: (NOTE) DRUG SCREEN FOR MEDICAL PURPOSES ONLY.  IF CONFIRMATION IS NEEDED FOR ANY PURPOSE, NOTIFY LAB WITHIN 5 DAYS. LOWEST DETECTABLE LIMITS FOR URINE DRUG SCREEN Drug Class                     Cutoff (ng/mL) Amphetamine and metabolites    1000 Barbiturate and metabolites    200 Benzodiazepine                 858 Tricyclics and metabolites     300 Opiates and metabolites        300 Cocaine and metabolites        300 THC                            50 Performed at [facility], [place] [name] [place], [place] [address]   Urinalysis, Routine w reflex microscopic     Status: Abnormal   Collection Time: 05/11/18  7:30 PM  Result Value Ref Range   Color, Urine YELLOW YELLOW   APPearance HAZY (A) CLEAR   Specific Gravity, Urine 1.010 1.005 - 1.030   pH 5.0 5.0 - 8.0   Glucose, UA NEGATIVE NEGATIVE mg/dL   Hgb urine dipstick MODERATE (A) NEGATIVE   Bilirubin Urine NEGATIVE NEGATIVE   Ketones, ur NEGATIVE NEGATIVE mg/dL   Protein, ur NEGATIVE NEGATIVE mg/dL   Nitrite POSITIVE (A) NEGATIVE   Leukocytes, UA MODERATE (A) NEGATIVE   RBC / HPF 21-50 0 - 5 RBC/hpf   WBC, UA 21-50 0 - 5 WBC/hpf   Bacteria, UA FEW (A) NONE SEEN   Squamous  Epithelial / LPF 0-5 0 - 5   Hyaline Casts, UA PRESENT     Comment: Performed at [facility], [place] [address] [place], [place] [address]   Ct Head Wo Contrast  Result Date: 05/11/2018 CLINICAL DATA:  Leg pain and altered level consciousness EXAM: CT HEAD WITHOUT CONTRAST TECHNIQUE: Contiguous axial images were obtained from the base of the skull through the vertex without intravenous contrast. COMPARISON:  07/20/2016 FINDINGS: Brain: There is no mass effect or acute intracranial hemorrhage. Shift to the right is likely related to atrophy. Global atrophy is appropriate to age. Small lacunar infarct in the right basal ganglia. Vascular: No hyperdense vessel or unexpected calcification. Skull: Intact Sinuses/Orbits: Mastoid air cells are clear. Small mucous retention cyst in the right maxillary sinus. Other: Noncontributory IMPRESSION: No acute intracranial pathology. Electronically Signed   By: [name] M.D.   On: 05/11/2018 17:16   Dg Chest Portable 1 View  Result Date: 05/11/2018 CLINICAL DATA:  Bilateral hip pain, lethargy and sleepiness. EXAM: PORTABLE CHEST 1 VIEW COMPARISON:  Chest x-ray 09/19/2017 FINDINGS: The heart is within normal limits in size given the AP projection and semi upright position. There is mild tortuosity of the thoracic aorta. Mild to moderate vascular congestion without overt pulmonary edema. No effusions or infiltrates. Stable moderate eventration of the right hemidiaphragm. The bony thorax is intact. IMPRESSION: Mild vascular congestion but no edema, infiltrates or effusions. 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 Electronically Signed   By: Marijo Sanes M.D.   On: 05/11/2018 15:56   Dg Hips Bilat W Or Wo Pelvis 3-4 Views  Result Date: 05/11/2018 CLINICAL DATA:  Bilateral leg pain. EXAM: DG HIP (WITH OR WITHOUT PELVIS) 3-4V BILAT COMPARISON:  None. FINDINGS: Both hips are normally located. Moderate bilateral hip joint  degenerative changes. No acute hip fracture or definite plain film findings for AVN. The pubic symphysis and SI joints are intact. No pelvic fractures or bone lesions. The proximal femoral appear grossly normal bilaterally. IMPRESSION: No acute bony findings. Electronically Signed   By: Marijo Sanes M.D.   On: 05/11/2018 15:57    Pending Labs Unresulted Labs (From admission, onward)    Start     Ordered   05/11/18 1538  Blood culture (routine x 2)  BLOOD CULTURE X 2,   STAT     05/11/18 1537          Vitals/Pain Today's Vitals   05/11/18 1513 05/11/18 1546 05/11/18 1720 05/11/18 1940  BP:   (!) 111/45 (!) 105/55  Pulse:   80 79  Resp:   15 19  Temp:  97.7 F (36.5 C)    TempSrc:  Rectal    SpO2:   95% 95%  PainSc: Asleep       Isolation Precautions No active isolations  Medications Medications  sodium polystyrene (KAYEXALATE) 15 GM/60ML suspension 30 g (0 g Oral Hold 05/11/18 1932)  naloxone (NARCAN) injection 0.4 mg (0.4 mg Intravenous Given 05/11/18 1430)  sodium chloride 0.9 % bolus 1,000 mL (0 mLs Intravenous Stopped 05/11/18 1531)  naloxone (NARCAN) injection 2 mg (2 mg Intravenous Given 05/11/18 1450)  sodium chloride 0.9 % bolus 1,000 mL (0 mLs Intravenous Stopped 05/11/18 1739)  ceFEPIme (MAXIPIME) 2 g in sodium chloride 0.9 % 100 mL IVPB (0 g Intravenous Stopped 05/11/18 2100)  sodium chloride 0.9 % bolus 500 mL (0 mLs Intravenous Stopped 05/11/18 2100)  calcium gluconate 1 g in sodium chloride 0.9 % 100 mL IVPB (1 g Intravenous New Bag/Given 05/11/18 1932)  sodium bicarbonate injection 50 mEq (50 mEq Intravenous Given 05/11/18 1930)    Mobility non-ambulatory
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
## 2018-05-12 DIAGNOSIS — N179 Acute kidney failure, unspecified: Principal | ICD-10-CM

## 2018-05-12 DIAGNOSIS — I10 Essential (primary) hypertension: Secondary | ICD-10-CM

## 2018-05-12 DIAGNOSIS — I959 Hypotension, unspecified: Secondary | ICD-10-CM

## 2018-05-12 LAB — COMPREHENSIVE METABOLIC PANEL
ALK PHOS: 100 U/L (ref 38–126)
ALT: 23 U/L (ref 0–44)
AST: 15 U/L (ref 15–41)
Albumin: 2.7 g/dL — ABNORMAL LOW (ref 3.5–5.0)
Anion gap: 12 (ref 5–15)
BILIRUBIN TOTAL: 0.8 mg/dL (ref 0.3–1.2)
BUN: 95 mg/dL — ABNORMAL HIGH (ref 8–23)
CO2: 24 mmol/L (ref 22–32)
CREATININE: 2.65 mg/dL — AB (ref 0.61–1.24)
Calcium: 8.4 mg/dL — ABNORMAL LOW (ref 8.9–10.3)
Chloride: 106 mmol/L (ref 98–111)
GFR, EST AFRICAN AMERICAN: 24 mL/min — AB (ref >60)
GFR, EST NON AFRICAN AMERICAN: 21 mL/min — AB (ref >60)
Glucose, Bld: 107 mg/dL — ABNORMAL HIGH (ref 70–99)
Potassium: 4.8 mmol/L (ref 3.5–5.1)
Sodium: 142 mmol/L (ref 135–145)
Total Protein: 5.7 g/dL — ABNORMAL LOW (ref 6.5–8.1)

## 2018-05-12 LAB — GLUCOSE, CAPILLARY
GLUCOSE-CAPILLARY: 76 mg/dL (ref 70–99)
GLUCOSE-CAPILLARY: 80 mg/dL (ref 70–99)
Glucose-Capillary: 99 mg/dL (ref 70–99)
Glucose-Capillary: 96 mg/dL (ref 70–99)

## 2018-05-12 LAB — CBC
HCT: 34.8 % — ABNORMAL LOW (ref 39.0–52.0)
Hemoglobin: 11.2 g/dL — ABNORMAL LOW (ref 13.0–17.0)
MCH: 29.6 pg (ref 26.0–34.0)
MCHC: 32.2 g/dL (ref 30.0–36.0)
MCV: 91.8 fL (ref 78.0–100.0)
Platelets: 276 10*3/uL (ref 150–400)
RBC: 3.79 MIL/uL — AB (ref 4.22–5.81)
RDW: 13.8 % (ref 11.5–15.5)
WBC: 14 10*3/uL — AB (ref 4.0–10.5)

## 2018-05-12 LAB — MRSA PCR SCREENING: MRSA by PCR: NEGATIVE

## 2018-05-12 LAB — TROPONIN I
Troponin I: <0.03 ng/mL (ref <0.03)
Troponin I: <0.03 ng/mL (ref <0.03)

## 2018-05-12 MED ORDER — HYDRALAZINE HCL 20 MG/ML IJ SOLN: 5.0000 mg | Freq: Three times a day (TID) | INTRAMUSCULAR | Status: DC | PRN

## 2018-05-12 NOTE — Progress Notes (Signed)
PROGRESS NOTE    [name]  WUJ:[id number] DOB: 03-01-1932 DOA: 05/11/2018 PCP: [name], MD  Brief Narrative:84 y.o. male, w hypertension, hyperlipidemia, dm2, nephropathy, chronic back pain, who has been taking Nsaids recently (ibuprofen and aleve) apparently presents with c/o back pain, worse today.  Pt also noted slight diarrhea, today (pt can't tell me how many times he went).  Pt denies fever, chisll, cough, cp, palp, sob, n/v, abd pain, brbpr, black stool.  Slight slurred speech, for unknown duration today resolved. Slightly altered mental status. Pt was brought by family primarily due to leg pain and AMS  In Ed,  T 97.7 P 79 Bp 105/55 pox 95% bp 85/50   Wbc 21.6, Hgb 11.2 Plt 251 Na 139, K 5.8 Bun 114, Creatinine 4.23 Ast 21, Alt 24 INR 1.14 CPK 102  Glucose 196 Lactic 0.94  CXR IMPRESSION: Mild vascular congestion but no edema, infiltrates or effusions.  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 Bilateral hip Xray  IMPRESSION: No acute bony findings.  CT brain IMPRESSION: No acute intracranial pathology.  UDS opiate +  Urinalysis wbc 21-50, rbc 21-50  Pt will be admitted for AMS secondary to UTI, and ARF, and hyperkalemia, and leukocytosis secondary to UTI and hypotension.   Assessment & Plan:   Principal Problem:   ARF (acute renal failure) (HCC) Active Problems:   Leukocytosis   Essential hypertension, benign   Hypotension   Type 2 diabetes with nephropathy ([place])   Acute encephalopathy secondary to UTI possible use of narcotics at home so urine culture pending continue Rocephin.  Hold Seroquel  Acute renal failure improved with IV hydration continue to hold ACE inhibitors Demadex and NSAIDs.  Hyperkalemia improved.  Cautious slow hydration.  Hypotension with history of hypertension on carvedilol and Imdur blood pressure soft.  Hold carvedilol and Imdur I will start her on hydralazine as needed.  UTI  continue Rocephin leukocytosis improving  Type 2 diabetes continue SSI holding Lantus  GERD PPI      DVT prophylaxis: Heparin Code Status: Full code  family Communication: Discussed with daughter-in-law Disposition Plan: TBD  Consultants: None  Procedures: None antimicrobials cefepime  Subjective: Patient resting in bed appears to be more a lot sleepier able to wake him up but answers questions appropriately and then goes back to sleep  Objective: Vitals:   05/12/18 0400 05/12/18 0439 05/12/18 0600 05/12/18 0800  BP: (!) 100/41  (!) 108/41   Pulse: 79  79   Resp: 19  19   Temp:    98.6 F (37 C)  TempSrc:    Oral  SpO2: 90%  90%   Weight:  123.7 kg    Height:        Intake/Output Summary (Last 24 hours) at 05/12/2018 1347 Last data filed at 05/12/2018 0600 Gross per 24 hour  Intake 3200 ml  Output 200 ml  Net 3000 ml   Filed Weights   05/11/18 2200 05/12/18 0439  Weight: 121.5 kg 123.7 kg    Examination: Sleepy able to wake him up able to hold a conversation but goes back to sleep  General exam: Appears calm and comfortable  Respiratory system: Clear to auscultation. Respiratory effort normal. Cardiovascular system: S1 & S2 heard, RRR. No JVD, murmurs, rubs, gallops or clicks. No pedal edema. Gastrointestinal system: Abdomen is nondistended, soft and nontender. No organomegaly or masses felt. Normal bowel sounds heard. Central nervous system: Alert and oriented. No focal neurological deficits. Extremities: Symmetric 5 x  5 power. Skin: No rashes, lesions or ulcers    Data Reviewed: I have personally reviewed following labs and imaging studies  CBC: Recent Labs  Lab 05/11/18 1441 05/12/18 0338  WBC 21.6* 14.0*  NEUTROABS 18.6*  --   HGB 11.2* 11.2*  HCT 35.0* 34.8*  MCV 92.3 91.8  PLT 251 102   Basic Metabolic Panel: Recent Labs  Lab 05/11/18 1441 05/11/18 2152 05/12/18 0338  NA 139 140 142  K 5.8* 5.0 4.8  CL 99 104 106  CO2 25 22 24     GLUCOSE 196* 121* 107*  BUN 114* 104* 95*  CREATININE 4.23* 3.25* 2.65*  CALCIUM 8.6* 8.4* 8.4*   GFR: Estimated Creatinine Clearance: 27.8 mL/min (A) (by C-G formula based on SCr of 2.65 mg/dL (H)). Liver Function Tests: Recent Labs  Lab 05/11/18 1441 05/12/18 0338  AST 21 15  ALT 24 23  ALKPHOS 113 100  BILITOT 0.8 0.8  PROT 6.4* 5.7*  ALBUMIN 3.2* 2.7*   No results for input(s): LIPASE, AMYLASE in the last 168 hours. No results for input(s): AMMONIA in the last 168 hours. Coagulation Profile: Recent Labs  Lab 05/11/18 1441  INR 1.14   Cardiac Enzymes: Recent Labs  Lab 05/11/18 1607 05/11/18 2152 05/12/18 0338 05/12/18 0948  CKTOTAL 102  --   --   --   TROPONINI  --  <0.03 <0.03 <0.03   BNP (last 3 results) Recent Labs    09/12/17 0946  PROBNP 74.0   HbA1C: No results for input(s): HGBA1C in the last 72 hours. CBG: Recent Labs  Lab 05/11/18 1433 05/12/18 0801 05/12/18 1229  GLUCAP 179* 76 99   Lipid Profile: No results for input(s): CHOL, HDL, LDLCALC, TRIG, CHOLHDL, LDLDIRECT in the last 72 hours. Thyroid Function Tests: No results for input(s): TSH, T4TOTAL, FREET4, T3FREE, THYROIDAB in the last 72 hours. Anemia Panel: No results for input(s): VITAMINB12, FOLATE, FERRITIN, TIBC, IRON, RETICCTPCT in the last 72 hours. Sepsis Labs: Recent Labs  Lab 05/11/18 1622  LATICACIDVEN 0.94    Recent Results (from the past 240 hour(s))  Blood culture (routine x 2)     Status: None (Preliminary result)   Collection Time: 05/11/18  4:08 PM  Result Value Ref Range Status   Specimen Description   Final    BLOOD LEFT ANTECUBITAL Performed at [facility] [address] [place], [place] [address]    Special Requests   Final    BOTTLES DRAWN AEROBIC AND ANAEROBIC Blood Culture results may not be optimal due to an inadequate volume of blood received in culture bottles Performed at [facility] [address]  [place], [place] [address]    Culture   Final    NO GROWTH < 24 HOURS Performed at [facility] [address], [place], [place] [address]    Report Status PENDING  Incomplete  Blood culture (routine x 2)     Status: None (Preliminary result)   Collection Time: 05/11/18  4:08 PM  Result Value Ref Range Status   Specimen Description   Final    BLOOD RIGHT ANTECUBITAL Performed at [facility] [address], [place], [place] [address]    Special Requests   Final    BOTTLES DRAWN AEROBIC AND ANAEROBIC Blood Culture results may not be optimal due to an inadequate volume of blood received in culture bottles Performed at [facility] [address] [place], [place] [address]  Culture   Final    NO GROWTH < 24 HOURS Performed at [facility], [place] [address], [place], [place] [address]    Report Status PENDING  Incomplete  MRSA PCR Screening     Status: None   Collection Time: 05/11/18 10:47 PM  Result Value Ref Range Status   MRSA by PCR NEGATIVE NEGATIVE Final    Comment:        The GeneXpert MRSA Assay (FDA approved for NASAL specimens only), is one component of a comprehensive MRSA colonization surveillance program. It is not intended to diagnose MRSA infection nor to guide or monitor treatment for MRSA infections. Performed at [facility], [place] [address] [place], [place] [address]          Radiology Studies: Ct Head Wo Contrast  Result Date: 05/11/2018 CLINICAL DATA:  Leg pain and altered level consciousness EXAM: CT HEAD WITHOUT CONTRAST TECHNIQUE: Contiguous axial images were obtained from the base of the skull through the vertex without intravenous contrast. COMPARISON:  07/20/2016 FINDINGS: Brain: There is no mass effect or acute intracranial hemorrhage. Shift to the right is likely related to atrophy. Global atrophy is appropriate to age. Small lacunar infarct in the right basal ganglia.  Vascular: No hyperdense vessel or unexpected calcification. Skull: Intact Sinuses/Orbits: Mastoid air cells are clear. Small mucous retention cyst in the right maxillary sinus. Other: Noncontributory IMPRESSION: No acute intracranial pathology. Electronically Signed   By: [name] M.D.   On: 05/11/2018 17:16   US Renal  Result Date: 05/11/2018 CLINICAL DATA:  Acute renal failure EXAM: RENAL / URINARY TRACT ULTRASOUND COMPLETE COMPARISON:  CT 09/19/2017, ultrasound 09/15/2017 FINDINGS: Right Kidney: Length: 13 cm. Echogenicity within normal limits. No mass or hydronephrosis visualized. Left Kidney: Length: 11.9 cm. Echogenicity within normal limits. No hydronephrosis. Cyst lower pole measuring 2 cm Bladder: Appears normal for degree of bladder distention. IMPRESSION: 1. Negative for hydronephrosis 2. Small cyst left kidney Electronically Signed   By: Donavan Foil M.D.   On: 05/11/2018 23:31   Dg Chest Portable 1 View  Result Date: 05/11/2018 CLINICAL DATA:  Bilateral hip pain, lethargy and sleepiness. EXAM: PORTABLE CHEST 1 VIEW COMPARISON:  Chest x-ray 09/19/2017 FINDINGS: The heart is within normal limits in size given the AP projection and semi upright position. There is mild tortuosity of the thoracic aorta. Mild to moderate vascular congestion without overt pulmonary edema. No effusions or infiltrates. Stable moderate eventration of the right hemidiaphragm. The bony thorax is intact. IMPRESSION: Mild vascular congestion but no edema, infiltrates or effusions. 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 Electronically Signed   By: Marijo Sanes M.D.   On: 05/11/2018 15:56   Dg Hips Bilat W Or Wo Pelvis 3-4 Views  Result Date: 05/11/2018 CLINICAL DATA:  Bilateral leg pain. EXAM: DG HIP (WITH OR WITHOUT PELVIS) 3-4V BILAT COMPARISON:  None. FINDINGS: Both hips are normally located. Moderate bilateral hip joint degenerative changes. No acute hip fracture or definite plain film  findings for AVN. The pubic symphysis and SI joints are intact. No pelvic fractures or bone lesions. The proximal femoral appear grossly normal bilaterally. IMPRESSION: No acute bony findings. Electronically Signed   By: Marijo Sanes M.D.   On: 05/11/2018 15:57        Scheduled Meds: . carvedilol  3.125 mg Oral BID  . cholecalciferol  2,000 Units Oral BID  . docusate sodium  100 mg Oral BID  . galantamine  16 mg Oral Q  breakfast  . heparin  5,000 Units Subcutaneous Q8H  . insulin aspart  0-5 Units Subcutaneous QHS  . insulin aspart  0-9 Units Subcutaneous TID WC  . isosorbide mononitrate  30 mg Oral Daily  . magnesium oxide  400 mg Oral BID  . multivitamin  2 tablet Oral BID  . polyethylene glycol  17 g Oral Daily  . QUEtiapine  50 mg Oral QHS  . sodium polystyrene  30 g Oral Once  . traZODone  50 mg Oral QHS   Continuous Infusions: . sodium chloride 100 mL/hr at 05/11/18 2148  . cefTRIAXone (ROCEPHIN)  IV Stopped (05/12/18 0225)     LOS: 1 day    Georgette Shell, MD [facility] If 7PM-7AM, please contact night-coverage [email or web address] Password [email or web address] 05/12/2018, 1:47 PM

## 2018-05-13 DIAGNOSIS — E1121 Type 2 diabetes mellitus with diabetic nephropathy: Secondary | ICD-10-CM

## 2018-05-13 DIAGNOSIS — D72829 Elevated white blood cell count, unspecified: Secondary | ICD-10-CM

## 2018-05-13 LAB — BASIC METABOLIC PANEL
ANION GAP: 7 (ref 5–15)
BUN: 66 mg/dL — AB (ref 8–23)
CALCIUM: 8.4 mg/dL — AB (ref 8.9–10.3)
CHLORIDE: 114 mmol/L — AB (ref 98–111)
CO2: 25 mmol/L (ref 22–32)
CREATININE: 1.52 mg/dL — AB (ref 0.61–1.24)
GFR calc Af Amer: 47 mL/min — ABNORMAL LOW (ref >60)
GFR calc non Af Amer: 40 mL/min — ABNORMAL LOW (ref >60)
Glucose, Bld: 88 mg/dL (ref 70–99)
POTASSIUM: 4.6 mmol/L (ref 3.5–5.1)
Sodium: 146 mmol/L — ABNORMAL HIGH (ref 135–145)

## 2018-05-13 LAB — CBC
HCT: 36.9 % — ABNORMAL LOW (ref 39.0–52.0)
Hemoglobin: 11.5 g/dL — ABNORMAL LOW (ref 13.0–17.0)
MCH: 28.9 pg (ref 26.0–34.0)
MCHC: 31.2 g/dL (ref 30.0–36.0)
MCV: 92.7 fL (ref 78.0–100.0)
PLATELETS: 287 10*3/uL (ref 150–400)
RBC: 3.98 MIL/uL — ABNORMAL LOW (ref 4.22–5.81)
RDW: 13.7 % (ref 11.5–15.5)
WBC: 8.8 10*3/uL (ref 4.0–10.5)

## 2018-05-13 LAB — GLUCOSE, CAPILLARY
GLUCOSE-CAPILLARY: 84 mg/dL (ref 70–99)
GLUCOSE-CAPILLARY: 147 mg/dL — AB (ref 70–99)
GLUCOSE-CAPILLARY: 163 mg/dL — AB (ref 70–99)
Glucose-Capillary: 129 mg/dL — ABNORMAL HIGH (ref 70–99)

## 2018-05-13 MED ORDER — INFLUENZA VAC SPLIT HIGH-DOSE 0.5 ML IM SUSY
0.5000 mL | PREFILLED_SYRINGE | INTRAMUSCULAR | Status: AC
  Administered 2018-05-14: 0.5 mL via INTRAMUSCULAR
  Filled 2018-05-13: qty 0.5

## 2018-05-13 MED ORDER — HYDROCODONE-ACETAMINOPHEN 5-325 MG PO TABS: 1.0000 | ORAL_TABLET | Freq: Four times a day (QID) | ORAL | Status: DC | PRN

## 2018-05-13 MED ORDER — TRAZODONE HCL 50 MG PO TABS
50.0000 mg | ORAL_TABLET | Freq: Once | ORAL | Status: AC
  Administered 2018-05-14: 50 mg via ORAL
  Filled 2018-05-13: qty 1

## 2018-05-13 MED ORDER — GLUCERNA SHAKE PO LIQD
237.0000 mL | Freq: Three times a day (TID) | ORAL | Status: DC
  Administered 2018-05-13 – 2018-05-14 (×3): 237 mL via ORAL
  Filled 2018-05-13 (×8): qty 237

## 2018-05-13 MED ORDER — MORPHINE SULFATE (PF) 2 MG/ML IV SOLN
2.0000 mg | Freq: Once | INTRAVENOUS | Status: AC
  Administered 2018-05-14: 2 mg via INTRAVENOUS
  Filled 2018-05-13: qty 1

## 2018-05-13 NOTE — Evaluation (Signed)
Physical Therapy One Time Evaluation Patient Details Name: [name] MRN: [id number] DOB: [birth year] Today's Date: 05/13/2018   History of Present Illness  84 y.o. male with PMHx significant for hypertension, hyperlipidemia, DM2, neuropathy, chronic back pain/arthritis bil knees, PTSD and admitted with AMS secondary to UTI and possible use of narcotics at home   Clinical Impression  Patient evaluated by Physical Therapy with no further acute PT needs identified. All education has been completed and the patient has no further questions.  Pt performed transfers in room.  He transfers to power w/c at baseline.  Pt reports no PT needs at this time as he is at his baseline mobility. See below for any follow-up Physical Therapy or equipment needs. PT is signing off. Thank you for this referral.     Follow Up Recommendations No PT follow up    Equipment Recommendations  None recommended by PT    Recommendations for Other Services       Precautions / Restrictions Precautions Precautions: Fall      Mobility  Bed Mobility Overal bed mobility: Needs Assistance Bed Mobility: Supine to Sit;Sit to Supine     Supine to sit: Supervision Sit to supine: Supervision      Transfers Overall transfer level: Needs assistance Equipment used: None Transfers: Sit to/from [facility] Sit to Stand: Min guard Stand pivot transfers: Min guard       General transfer comment: pt placed recliner as he would his power w/c, pt utilized armrests to stand, turn and sit bed <-> recliner; verbal cues for safety with lines however pt removed upon sitting (RN into room to replace end of session). pt with decreased control however did not require assist (ie plopping) as likely his baseline  Ambulation/Gait             General Gait Details: not ambulatory at baseline due to R calcaneal ORIF history (states healed incorrectly due to being told to place weight on R foot - believes he  should have been NWB)  Stairs            Wheelchair Mobility    Modified Rankin (Stroke Patients Only)       Balance Overall balance assessment: Needs assistance;History of Falls         Standing balance support: Bilateral upper extremity supported;During functional activity Standing balance-Leahy Scale: Poor Standing balance comment: requires UE support for transfers                             Pertinent Vitals/Pain Pain Assessment: No/denies pain(no c/o pain during session)    Home Living Family/patient expects to be discharged to:: Private residence Living Arrangements: Spouse/significant other Available Help at Discharge: Family Type of Home: Apartment Home Access: Level entry     Home Layout: One level Home Equipment: Environmental consultant - 2 wheels;Wheelchair - power Additional Comments: pt reports living in handicap apt    Prior Function Level of Independence: Independent with assistive device(s)         Comments: per pt, modified independent with power w/c transfers     Hand Dominance        Extremity/Trunk Assessment   Upper Extremity Assessment Upper Extremity Assessment: Overall WFL for tasks assessed    Lower Extremity Assessment Lower Extremity Assessment: Generalized weakness       Communication   Communication: HOH  Cognition Arousal/Alertness: Awake/alert Behavior During Therapy: Impulsive Overall Cognitive Status: Within Functional Limits for  tasks assessed                                        General Comments      Exercises     Assessment/Plan    PT Assessment Patent does not need any further PT services  PT Problem List         PT Treatment Interventions      PT Goals (Current goals can be found in the Care Plan section)  Acute Rehab PT Goals PT Goal Formulation: All assessment and education complete, DC therapy    Frequency     Barriers to discharge        Co-evaluation                AM-PAC PT "6 Clicks" Daily Activity  Outcome Measure Difficulty turning over in bed (including adjusting bedclothes, sheets and blankets)?: None Difficulty moving from lying on back to sitting on the side of the bed? : None Difficulty sitting down on and standing up from a chair with arms (e.g., wheelchair, bedside commode, etc,.)?: A Little Help needed moving to and from a bed to chair (including a wheelchair)?: A Little Help needed walking in hospital room?: Total Help needed climbing 3-5 steps with a railing? : Total 6 Click Score: 16    End of Session   Activity Tolerance: Patient tolerated treatment well Patient left: in bed;with bed alarm set;with call bell/phone within reach;with nursing/sitter in room Nurse Communication: Mobility status PT Visit Diagnosis: Unsteadiness on feet (R26.81)    Time: 1062-6948 PT Time Calculation (min) (ACUTE ONLY): 15 min   Charges:   PT Evaluation $PT Eval Low Complexity: [place], PT, DPT Acute Rehabilitation Services Office: [phone] Pager: [phone]  Trena Platt 05/13/2018, 2:33 PM

## 2018-05-13 NOTE — Progress Notes (Signed)
PROGRESS NOTE    [name]  UXN:[id number] DOB: 11/07/1931 DOA: 05/11/2018 PCP: [name], MD  Brief Narrative: 84 y.o.male,w hypertension, hyperlipidemia, dm2, nephropathy, chronic back pain, who has been taking Nsaids recently (ibuprofen and aleve) apparently presents with c/o back pain, worse today. Pt also noted slight diarrhea, today (pt can't tell me how many times he went). Pt denies fever, chisll, cough, cp, palp, sob, n/v, abd pain, brbpr, black stool. Slight slurred speech, for unknown duration today resolved. Slightly altered mental status. Pt was brought by family primarily due to leg pain and AMS  In Ed, T 97.7 P 79 Bp 105/55 pox 95% bp 85/50   Wbc 21.6, Hgb 11.2 Plt 251 Na 139, K 5.8 Bun 114, Creatinine 4.23 Ast 21, Alt 24 INR 1.14 CPK 102  Glucose 196 Lactic 0.94 CXR IMPRESSION: Mild vascular congestion but no edema, infiltrates or effusions.  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 Bilateral hip Xray IMPRESSION: No acute bony findings.  CT brain IMPRESSION: No acute intracranial pathology.  UDS opiate +  Urinalysis wbc 21-50, rbc 21-50  Pt will be admitted for AMS secondary to UTI, and ARF, and hyperkalemia, and leukocytosis secondary to UTI and hypotension. Assessment & Plan:   Principal Problem:   ARF (acute renal failure) (HCC) Active Problems:   Leukocytosis   Essential hypertension, benign   Hypotension   Type 2 diabetes with nephropathy (HCC)   AKI (acute kidney injury) ([facility])  Acute encephalopathy secondary to UTI possible use of narcotics at home  urine culture pending continue Rocephin.  Hold Seroquel.HE is much more awake today.at base line he is alert.  Acute renal failure improved with IV hydration continue to hold ACE inhibitors Demadex and NSAIDs.  Hyperkalemia improved.  Cautious slow hydration.  Hypotension with history of hypertension on carvedilol and Imdur blood pressure  soft.  Hold carvedilol and Imdur I will start her on hydralazine as needed.  UTI continue Rocephin leukocytosis improving URINE CULTURE CITROBACTER PENDING SENSITIVITY  Type 2 diabetes continue SSI holding Lantus BLOOD SUGAR STABLE  GERD PPI     DVT prophylaxis: heparin Code Status full Family Communication:dw dil Disposition Planplan dc in am if renal function improving.consult PT  Consultants: none  Procedures:NONE Antimicrobials: ROCEPHIN  Subjective:   Objective: Vitals:   05/13/18 0400 05/13/18 0600 05/13/18 0800 05/13/18 1000  BP: (!) 134/47 131/75 (!) 127/52 (!) 148/106  Pulse: 76 (!) 101 (!) 50   Resp: 18 20 15  (!) 27  Temp: (!) 97.4 F (36.3 C)  98.1 F (36.7 C)   TempSrc: Oral  Oral   SpO2: 96% 95% 94%   Weight: 104.9 kg     Height:        Intake/Output Summary (Last 24 hours) at 05/13/2018 1421 Last data filed at 05/13/2018 0900 Gross per 24 hour  Intake 360 ml  Output 2700 ml  Net -2340 ml   Filed Weights   05/11/18 2200 05/12/18 0439 05/13/18 0400  Weight: 121.5 kg 123.7 kg 104.9 kg    Examination:  General exam: Appears calm and comfortable  Respiratory system: Clear to auscultation. Respiratory effort normal. Cardiovascular system: S1 & S2 heard, RRR. No JVD, murmurs, rubs, gallops or clicks. No pedal edema. Gastrointestinal system: Abdomen is nondistended, soft and nontender. No organomegaly or masses felt. Normal bowel sounds heard. Central nervous system: Alert and oriented. No focal neurological deficits. Extremities: Symmetric 5 x 5 power. Skin: No rashes, lesions or ulcers Psychiatry: Judgement and insight appear  normal. Mood & affect appropriate.     Data Reviewed: I have personally reviewed following labs and imaging studies  CBC: Recent Labs  Lab 05/11/18 1441 05/12/18 0338 05/13/18 0344  WBC 21.6* 14.0* 8.8  NEUTROABS 18.6*  --   --   HGB 11.2* 11.2* 11.5*  HCT 35.0* 34.8* 36.9*  MCV 92.3 91.8 92.7  PLT 251 276  154   Basic Metabolic Panel: Recent Labs  Lab 05/11/18 1441 05/11/18 2152 05/12/18 0338 05/13/18 0344  NA 139 140 142 146*  K 5.8* 5.0 4.8 4.6  CL 99 104 106 114*  CO2 25 22 24 25   GLUCOSE 196* 121* 107* 88  BUN 114* 104* 95* 66*  CREATININE 4.23* 3.25* 2.65* 1.52*  CALCIUM 8.6* 8.4* 8.4* 8.4*   GFR: Estimated Creatinine Clearance: 44.6 mL/min (A) (by C-G formula based on SCr of 1.52 mg/dL (H)). Liver Function Tests: Recent Labs  Lab 05/11/18 1441 05/12/18 0338  AST 21 15  ALT 24 23  ALKPHOS 113 100  BILITOT 0.8 0.8  PROT 6.4* 5.7*  ALBUMIN 3.2* 2.7*   No results for input(s): LIPASE, AMYLASE in the last 168 hours. No results for input(s): AMMONIA in the last 168 hours. Coagulation Profile: Recent Labs  Lab 05/11/18 1441  INR 1.14   Cardiac Enzymes: Recent Labs  Lab 05/11/18 1607 05/11/18 2152 05/12/18 0338 05/12/18 0948  CKTOTAL 102  --   --   --   TROPONINI  --  <0.03 <0.03 <0.03   BNP (last 3 results) Recent Labs    09/12/17 0946  PROBNP 74.0   HbA1C: No results for input(s): HGBA1C in the last 72 hours. CBG: Recent Labs  Lab 05/12/18 1229 05/12/18 1611 05/12/18 2129 05/13/18 0724 05/13/18 1221  GLUCAP 99 96 80 84 129*   Lipid Profile: No results for input(s): CHOL, HDL, LDLCALC, TRIG, CHOLHDL, LDLDIRECT in the last 72 hours. Thyroid Function Tests: No results for input(s): TSH, T4TOTAL, FREET4, T3FREE, THYROIDAB in the last 72 hours. Anemia Panel: No results for input(s): VITAMINB12, FOLATE, FERRITIN, TIBC, IRON, RETICCTPCT in the last 72 hours. Sepsis Labs: Recent Labs  Lab 05/11/18 1622  LATICACIDVEN 0.94    Recent Results (from the past 240 hour(s))  Blood culture (routine x 2)     Status: None (Preliminary result)   Collection Time: 05/11/18  4:08 PM  Result Value Ref Range Status   Specimen Description   Final    BLOOD LEFT ANTECUBITAL Performed at [facility] [address], [place], [place]  [address]    Special Requests   Final    BOTTLES DRAWN AEROBIC AND ANAEROBIC Blood Culture results may not be optimal due to an inadequate volume of blood received in culture bottles Performed at [facility] [address], [place], [place] [address]    Culture   Final    NO GROWTH < 24 HOURS Performed at [facility] [address], [place], [place] [address]    Report Status PENDING  Incomplete  Blood culture (routine x 2)     Status: None (Preliminary result)   Collection Time: 05/11/18  4:08 PM  Result Value Ref Range Status   Specimen Description   Final    BLOOD RIGHT ANTECUBITAL Performed at [facility] [address], [place],  [address]    Special Requests   Final    BOTTLES DRAWN AEROBIC AND ANAEROBIC Blood Culture results may not be optimal due to an inadequate  volume of blood received in culture bottles Performed at [facility] [address] [place], [place] [address]    Culture   Final    NO GROWTH < 24 HOURS Performed at [facility] [address], [place], [place] [address]    Report Status PENDING  Incomplete  Culture, Urine     Status: Abnormal (Preliminary result)   Collection Time: 05/11/18  7:30 PM  Result Value Ref Range Status   Specimen Description   Final    URINE, RANDOM Performed at [facility] [name], [place], [place] [address]    Special Requests   Final    NONE Performed at [facility], [place] [name] [place], [place] [address]    Culture >=100,000 COLONIES/mL CITROBACTER FREUNDII (A)  Final   Report Status PENDING  Incomplete  MRSA PCR Screening     Status: None   Collection Time: 05/11/18 10:47 PM  Result Value Ref Range Status   MRSA by PCR NEGATIVE NEGATIVE Final    Comment:        The GeneXpert MRSA Assay (FDA approved for NASAL specimens only), is one component of a comprehensive MRSA  colonization surveillance program. It is not intended to diagnose MRSA infection nor to guide or monitor treatment for MRSA infections. Performed at [facility], [place] [address], [place], [place] [address]          Radiology Studies: Ct Head Wo Contrast  Result Date: 05/11/2018 CLINICAL DATA:  Leg pain and altered level consciousness EXAM: CT HEAD WITHOUT CONTRAST TECHNIQUE: Contiguous axial images were obtained from the base of the skull through the vertex without intravenous contrast. COMPARISON:  07/20/2016 FINDINGS: Brain: There is no mass effect or acute intracranial hemorrhage. Shift to the right is likely related to atrophy. Global atrophy is appropriate to age. Small lacunar infarct in the right basal ganglia. Vascular: No hyperdense vessel or unexpected calcification. Skull: Intact Sinuses/Orbits: Mastoid air cells are clear. Small mucous retention cyst in the right maxillary sinus. Other: Noncontributory IMPRESSION: No acute intracranial pathology. Electronically Signed   By: [name] M.D.   On: 05/11/2018 17:16   US Renal  Result Date: 05/11/2018 CLINICAL DATA:  Acute renal failure EXAM: RENAL / URINARY TRACT ULTRASOUND COMPLETE COMPARISON:  CT 09/19/2017, ultrasound 09/15/2017 FINDINGS: Right Kidney: Length: 13 cm. Echogenicity within normal limits. No mass or hydronephrosis visualized. Left Kidney: Length: 11.9 cm. Echogenicity within normal limits. No hydronephrosis. Cyst lower pole measuring 2 cm Bladder: Appears normal for degree of bladder distention. IMPRESSION: 1. Negative for hydronephrosis 2. Small cyst left kidney Electronically Signed   By: Donavan Foil M.D.   On: 05/11/2018 23:31   Dg Chest Portable 1 View  Result Date: 05/11/2018 CLINICAL DATA:  Bilateral hip pain, lethargy and sleepiness. EXAM: PORTABLE CHEST 1 VIEW COMPARISON:  Chest x-ray 09/19/2017 FINDINGS: The heart is within normal limits in size given the AP projection and semi upright  position. There is mild tortuosity of the thoracic aorta. Mild to moderate vascular congestion without overt pulmonary edema. No effusions or infiltrates. Stable moderate eventration of the right hemidiaphragm. The bony thorax is intact. IMPRESSION: Mild vascular congestion but no edema, infiltrates or effusions. 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 Electronically Signed   By: Marijo Sanes M.D.   On: 05/11/2018 15:56   Dg Hips Bilat W Or Wo Pelvis 3-4 Views  Result Date: 05/11/2018 CLINICAL DATA:  Bilateral leg pain.  EXAM: DG HIP (WITH OR WITHOUT PELVIS) 3-4V BILAT COMPARISON:  None. FINDINGS: Both hips are normally located. Moderate bilateral hip joint degenerative changes. No acute hip fracture or definite plain film findings for AVN. The pubic symphysis and SI joints are intact. No pelvic fractures or bone lesions. The proximal femoral appear grossly normal bilaterally. IMPRESSION: No acute bony findings. Electronically Signed   By: Marijo Sanes M.D.   On: 05/11/2018 15:57        Scheduled Meds: . cholecalciferol  2,000 Units Oral BID  . docusate sodium  100 mg Oral BID  . feeding supplement (GLUCERNA SHAKE)  237 mL Oral TID BM  . galantamine  16 mg Oral Q breakfast  . heparin  5,000 Units Subcutaneous Q8H  . insulin aspart  0-5 Units Subcutaneous QHS  . insulin aspart  0-9 Units Subcutaneous TID WC  . magnesium oxide  400 mg Oral BID  . multivitamin  2 tablet Oral BID  . polyethylene glycol  17 g Oral Daily   Continuous Infusions: . cefTRIAXone (ROCEPHIN)  IV Stopped (05/13/18 0457)     LOS: 2 days     Georgette Shell, MD [facility]  If 7PM-7AM, please contact night-coverage [email or web address] Password [email or web address] 05/13/2018, 2:21 PM

## 2018-05-13 NOTE — Progress Notes (Signed)
Patient arrived on unit via wheelchair from stepdown.  Wife at bedside.

## 2018-05-14 LAB — BASIC METABOLIC PANEL
Anion gap: 8 (ref 5–15)
BUN: 42 mg/dL — AB (ref 8–23)
CO2: 26 mmol/L (ref 22–32)
CREATININE: 1.16 mg/dL (ref 0.61–1.24)
Calcium: 8.6 mg/dL — ABNORMAL LOW (ref 8.9–10.3)
Chloride: 109 mmol/L (ref 98–111)
GFR calc Af Amer: >60 mL/min (ref >60)
GFR, EST NON AFRICAN AMERICAN: 56 mL/min — AB (ref >60)
GLUCOSE: 140 mg/dL — AB (ref 70–99)
Potassium: 4.8 mmol/L (ref 3.5–5.1)
SODIUM: 143 mmol/L (ref 135–145)

## 2018-05-14 LAB — GLUCOSE, CAPILLARY
GLUCOSE-CAPILLARY: 118 mg/dL — AB (ref 70–99)
Glucose-Capillary: 102 mg/dL — ABNORMAL HIGH (ref 70–99)

## 2018-05-14 LAB — URINE CULTURE: Culture: >=100000 — AB

## 2018-05-14 MED ORDER — CIPROFLOXACIN HCL 250 MG PO TABS: 250.0000 mg | ORAL_TABLET | Freq: Two times a day (BID) | ORAL | 0 refills | Status: AC

## 2018-05-14 MED ORDER — INSULIN GLARGINE 100 UNIT/ML ~~LOC~~ SOLN: SUBCUTANEOUS | 3 refills | Status: AC

## 2018-05-14 MED ORDER — FENTANYL 25 MCG/HR TD PT72: 25.0000 ug | MEDICATED_PATCH | TRANSDERMAL | 0 refills | Status: AC

## 2018-05-14 NOTE — Discharge Summary (Signed)
Physician Discharge Summary  DHRUVAN GULLION MSX:[id number] DOB: [birth year] DOA: 05/11/2018  PCP: [name], MD  Admit date: 05/11/2018 Discharge date: 05/14/2018  Admitted From: Home Disposition: Home Recommendations for Outpatient Follow-up:  1. Follow up with PCP in 1-2 weeks 2. Please obtain BMP/CBC in one week  Home Health: None Equipment/Devices none  Discharge Condition stable CODE STATUS: Full code Diet recommendation: Cardiac Brief/Interim Summary:84 y.o.male,w hypertension, hyperlipidemia, dm2, nephropathy, chronic back pain, who has been taking Nsaids recently (ibuprofen and aleve) apparently presents with c/o back pain, worse today. Pt also noted slight diarrhea, today (pt can't tell me how many times he went). Pt denies fever, chisll, cough, cp, palp, sob, n/v, abd pain, brbpr, black stool. Slight slurred speech, for unknown duration today resolved. Slightly altered mental status. Pt was brought by family primarily due to leg pain and AMS  In Ed, T 97.7 P 79 Bp 105/55 pox 95% bp 85/50 Wbc 21.6, Hgb 11.2 Plt 251 Na 139, K 5.8 Bun 114, Creatinine 4.23 Ast 21, Alt 24 INR 1.14 CPK 102  Glucose 196 Lactic 0.94 CXR IMPRESSION: Mild vascular congestion but no edema, infiltrates or effusions.  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 Bilateral hip Xray IMPRESSION: No acute bony findings.  CT brain IMPRESSION: No acute intracranial pathology.  UDS opiate +  Urinalysis wbc 21-50, rbc 21-50  Pt will be admitted for AMS secondary to UTI, and ARF, and hyperkalemia, and leukocytosis secondary to UTI and hypotension Discharge Diagnoses:  Principal Problem:   ARF (acute renal failure) ([place]) Active Problems:   Leukocytosis   Essential hypertension, benign   Hypotension   Type 2 diabetes with nephropathy ([place])   AKI (acute kidney injury) ([place])  #1 Citrobacter UTI sensitive to ciprofloxacin.  Patient will be discharged on  Cipro.  He was admitted to the hospital started treatment on with Rocephin.  He remained afebrile leukocytosis improved and resolved.  2.  Acute encephalopathy secondary to UTI improved with treatment.  It is also noted that patient is on multiple medications as an outpatient which could interfere with his mental status.  Seroquel was stopped.  Dilaudid was stopped.  #3 acute renal failure secondary to dehydration patient's ACE inhibitor Demadex and NSAIDs were stopped and he was hydrated renal functions came back to baseline.  At the time of discharge NSAIDs were stopped Demadex will be stopped patient should not be on any NSAIDs as an outpatient.  Restart Demadex if needed as an outpatient.  #4 history of hypertension his blood pressure was soft during the hospital stay-at the time of discharge aim Imdur Zestril and Demadex were stopped due to soft blood pressure.  5 type 2 diabetes blood sugar stable Lantus dose decreased to 10 units nightly monitor as an outpatient.  Discharge Instructions  Discharge Instructions    Call MD for:  persistant dizziness or light-headedness   Complete by:  As directed    Call MD for:  persistant nausea and vomiting   Complete by:  As directed    Call MD for:  redness, tenderness, or signs of infection (pain, swelling, redness, odor or green/yellow discharge around incision site)   Complete by:  As directed    Diet - low sodium heart healthy   Complete by:  As directed    Increase activity slowly   Complete by:  As directed      Allergies as of 05/14/2018      Reactions   Oxycontin [oxycodone Hcl] Other (See Comments)  Pt states he cant sleep w/ this med; also makes him constipated.       Medication List    STOP taking these medications   HYDROmorphone 2 MG tablet Commonly known as:  DILAUDID   ibuprofen 200 MG tablet Commonly known as:  ADVIL,MOTRIN   isosorbide mononitrate 30 MG 24 hr tablet Commonly known as:  IMDUR   lisinopril 20 MG  tablet Commonly known as:  PRINIVIL,ZESTRIL   pantoprazole 40 MG tablet Commonly known as:  PROTONIX   QUEtiapine 50 MG tablet Commonly known as:  SEROQUEL   tamsulosin 0.4 MG Caps capsule Commonly known as:  FLOMAX   torsemide 20 MG tablet Commonly known as:  DEMADEX     TAKE these medications   ACCU-CHEK AVIVA PLUS w/Device Kit Use glucometer to test sugars 4 times daily. Dx. E11.9   calcium-vitamin D 500-200 MG-UNIT tablet Commonly known as:  OSCAL WITH D Take 1 tablet by mouth.   carvedilol 3.125 MG tablet Commonly known as:  COREG TAKE 1 TABLET TWICE A DAY WITH MEALS What changed:  when to take this   ciprofloxacin 250 MG tablet Commonly known as:  CIPRO Take 1 tablet (250 mg total) by mouth 2 (two) times daily for 7 days.   docusate sodium 100 MG capsule Commonly known as:  COLACE Take 1 capsule (100 mg total) by mouth 2 (two) times daily.   fentaNYL 25 MCG/HR patch Commonly known as:  DURAGESIC - dosed mcg/hr Place 1 patch (25 mcg total) onto the skin every 3 (three) days for 7 days.   freestyle lancets Use as instructed to check sugars 4 times daily. Dx E11.9   galantamine 16 MG 24 hr capsule Commonly known as:  RAZADYNE ER Take 16 mg by mouth daily with breakfast.   glucose blood test strip Use one strip to test sugars. Pt checks sugars 4 times daily. Dx. E11.9   HUMALOG 100 UNIT/ML injection Generic drug:  insulin lispro Inject 0-5 Units into the skin 3 (three) times daily before meals. 0-200 = 0 units, 201-250 = 2 units, 251-300 = 3 units, 301-350 = 4 units, 351-400 = 5 units, >401 call MD/NP   insulin glargine 100 UNIT/ML injection Commonly known as:  LANTUS 10 units nightly What changed:    how much to take  how to take this  when to take this  additional instructions   lactose free nutrition Liqd Take 237 mLs by mouth daily as needed (nutrition).   Magnesium Oxide 420 (252 Mg) MG Tabs Take 420 mg by mouth 2 (two) times daily.    Melatonin 5 MG Tabs Take 5 mg by mouth at bedtime.   methocarbamol 750 MG tablet Commonly known as:  ROBAXIN Take 1 tablet (750 mg total) by mouth every 6 (six) hours as needed for muscle spasms.   polyethylene glycol packet Commonly known as:  MIRALAX / GLYCOLAX Take 17-25.5 g by mouth daily.   PRESERVISION AREDS 2 PO Take 1 capsule by mouth 2 (two) times daily.   rosuvastatin 40 MG tablet Commonly known as:  CRESTOR Take 20 mg by mouth every evening.   SYSTANE BALANCE 0.6 % Soln Generic drug:  Propylene Glycol Place 1 drop into both eyes daily as needed (dry eyes).   THERATEARS OP Apply 1 drop to eye 4 (four) times daily as needed (dry eyes).   traZODone 50 MG tablet Commonly known as:  DESYREL Take 50 mg by mouth at bedtime.   vitamin B-12 500 MCG tablet  Commonly known as:  CYANOCOBALAMIN Take 500 mcg by mouth 2 (two) times daily.   Vitamin D3 2000 units capsule Take 2,000 Units by mouth 2 (two) times daily.      Follow-up Information    [name], MD Follow up.   Specialty:  Family Medicine Contact information: [address] [place] [address] [phone]          Allergies  Allergen Reactions  . Oxycontin [Oxycodone Hcl] Other (See Comments)    Pt states he cant sleep w/ this med; also makes him constipated.     Consultations:  None   Procedures/Studies: Ct Head Wo Contrast  Result Date: 05/11/2018 CLINICAL DATA:  Leg pain and altered level consciousness EXAM: CT HEAD WITHOUT CONTRAST TECHNIQUE: Contiguous axial images were obtained from the base of the skull through the vertex without intravenous contrast. COMPARISON:  07/20/2016 FINDINGS: Brain: There is no mass effect or acute intracranial hemorrhage. Shift to the right is likely related to atrophy. Global atrophy is appropriate to age. Small lacunar infarct in the right basal ganglia. Vascular: No hyperdense vessel or unexpected calcification. Skull: Intact Sinuses/Orbits:  Mastoid air cells are clear. Small mucous retention cyst in the right maxillary sinus. Other: Noncontributory IMPRESSION: No acute intracranial pathology. Electronically Signed   By: [name] M.D.   On: 05/11/2018 17:16   US Renal  Result Date: 05/11/2018 CLINICAL DATA:  Acute renal failure EXAM: RENAL / URINARY TRACT ULTRASOUND COMPLETE COMPARISON:  CT 09/19/2017, ultrasound 09/15/2017 FINDINGS: Right Kidney: Length: 13 cm. Echogenicity within normal limits. No mass or hydronephrosis visualized. Left Kidney: Length: 11.9 cm. Echogenicity within normal limits. No hydronephrosis. Cyst lower pole measuring 2 cm Bladder: Appears normal for degree of bladder distention. IMPRESSION: 1. Negative for hydronephrosis 2. Small cyst left kidney Electronically Signed   By: Donavan Foil M.D.   On: 05/11/2018 23:31   Dg Chest Portable 1 View  Result Date: 05/11/2018 CLINICAL DATA:  Bilateral hip pain, lethargy and sleepiness. EXAM: PORTABLE CHEST 1 VIEW COMPARISON:  Chest x-ray 09/19/2017 FINDINGS: The heart is within normal limits in size given the AP projection and semi upright position. There is mild tortuosity of the thoracic aorta. Mild to moderate vascular congestion without overt pulmonary edema. No effusions or infiltrates. Stable moderate eventration of the right hemidiaphragm. The bony thorax is intact. IMPRESSION: Mild vascular congestion but no edema, infiltrates or effusions. 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 Electronically Signed   By: Marijo Sanes M.D.   On: 05/11/2018 15:56   Dg Hips Bilat W Or Wo Pelvis 3-4 Views  Result Date: 05/11/2018 CLINICAL DATA:  Bilateral leg pain. EXAM: DG HIP (WITH OR WITHOUT PELVIS) 3-4V BILAT COMPARISON:  None. FINDINGS: Both hips are normally located. Moderate bilateral hip joint degenerative changes. No acute hip fracture or definite plain film findings for AVN. The pubic symphysis and SI joints are intact. No pelvic fractures or bone  lesions. The proximal femoral appear grossly normal bilaterally. IMPRESSION: No acute bony findings. Electronically Signed   By: Marijo Sanes M.D.   On: 05/11/2018 15:57    (Echo, Carotid, EGD, Colonoscopy, ERCP)    Subjective:   Discharge Exam: Vitals:   05/13/18 2042 05/14/18 0445  BP: 125/90 133/62  Pulse: 75 69  Resp: 18 18  Temp: 98 F (36.7 C) 98.1 F (36.7 C)  SpO2: 98% 95%   Vitals:   05/13/18 1605 05/13/18 1716 05/13/18 2042 05/14/18 0445  BP: (!) 147/63 129/71  125/90 133/62  Pulse: 74 69 75 69  Resp: '20 20 18 18  ' Temp:  98.2 F (36.8 C) 98 F (36.7 C) 98.1 F (36.7 C)  TempSrc:  Oral Oral Oral  SpO2: 99% 97% 98% 95%  Weight:    128.5 kg  Height:        General: Pt is alert, awake, not in acute distress Cardiovascular: RRR, S1/S2 +, no rubs, no gallops Respiratory: CTA bilaterally, no wheezing, no rhonchi Abdominal: Soft, NT, ND, bowel sounds + Extremities: no edema, no cyanosis    The results of significant diagnostics from this hospitalization (including imaging, microbiology, ancillary and laboratory) are listed below for reference.     Microbiology: Recent Results (from the past 240 hour(s))  Blood culture (routine x 2)     Status: None (Preliminary result)   Collection Time: 05/11/18  4:08 PM  Result Value Ref Range Status   Specimen Description   Final    BLOOD LEFT ANTECUBITAL Performed at [facility] [address] [place], [place] [address]    Special Requests   Final    BOTTLES DRAWN AEROBIC AND ANAEROBIC Blood Culture results may not be optimal due to an inadequate volume of blood received in culture bottles Performed at [facility] [address] [place], [place] [address]    Culture   Final    NO GROWTH 3 DAYS Performed at [facility], [place] [address], [place], [place] [address]    Report Status PENDING  Incomplete  Blood culture (routine x 2)     Status: None (Preliminary result)    Collection Time: 05/11/18  4:08 PM  Result Value Ref Range Status   Specimen Description   Final    BLOOD RIGHT ANTECUBITAL Performed at [facility] [address], [place], [place] [address]    Special Requests   Final    BOTTLES DRAWN AEROBIC AND ANAEROBIC Blood Culture results may not be optimal due to an inadequate volume of blood received in culture bottles Performed at [facility] [address], [place], [place] [address]    Culture   Final    NO GROWTH 3 DAYS Performed at [facility], [place] [address] [place], [place] [address]    Report Status PENDING  Incomplete  Culture, Urine     Status: Abnormal   Collection Time: 05/11/18  7:30 PM  Result Value Ref Range Status   Specimen Description   Final    URINE, RANDOM Performed at [facility] [address], [place],  [address]    Special Requests   Final    NONE Performed at [facility], [place] [address] [place], [place] [address]    Culture >=100,000 COLONIES/mL CITROBACTER FREUNDII (A)  Final   Report Status 05/14/2018 FINAL  Final   Organism ID, Bacteria CITROBACTER FREUNDII (A)  Final      Susceptibility   Citrobacter freundii - MIC*    CEFAZOLIN >=64 RESISTANT Resistant     CEFTRIAXONE <=1 SENSITIVE Sensitive     CIPROFLOXACIN <=0.25 SENSITIVE Sensitive     GENTAMICIN <=1 SENSITIVE Sensitive     IMIPENEM 0.5 SENSITIVE Sensitive     NITROFURANTOIN <=16 SENSITIVE Sensitive     TRIMETH/SULFA <=20 SENSITIVE Sensitive     PIP/TAZO <=4 SENSITIVE Sensitive     * >=100,000 COLONIES/mL CITROBACTER FREUNDII  MRSA PCR Screening     Status: None   Collection Time:  05/11/18 10:47 PM  Result Value Ref Range Status   MRSA by PCR NEGATIVE NEGATIVE Final    Comment:        The GeneXpert MRSA Assay (FDA approved for NASAL specimens only), is one component of a comprehensive MRSA colonization surveillance program. It is  not intended to diagnose MRSA infection nor to guide or monitor treatment for MRSA infections. Performed at [facility], [place] [address], [place], [place] [address]      Labs: BNP (last 3 results) Recent Labs    09/14/17 2111 09/19/17 0444  BNP 26.7 07.2   Basic Metabolic Panel: Recent Labs  Lab 05/11/18 1441 05/11/18 2152 05/12/18 0338 05/13/18 0344 05/14/18 0530  NA 139 140 142 146* 143  K 5.8* 5.0 4.8 4.6 4.8  CL 99 104 106 114* 109  CO2 '25 22 24 25 26  ' GLUCOSE 196* 121* 107* 88 140*  BUN 114* 104* 95* 66* 42*  CREATININE 4.23* 3.25* 2.65* 1.52* 1.16  CALCIUM 8.6* 8.4* 8.4* 8.4* 8.6*   Liver Function Tests: Recent Labs  Lab 05/11/18 1441 05/12/18 0338  AST 21 15  ALT 24 23  ALKPHOS 113 100  BILITOT 0.8 0.8  PROT 6.4* 5.7*  ALBUMIN 3.2* 2.7*   No results for input(s): LIPASE, AMYLASE in the last 168 hours. No results for input(s): AMMONIA in the last 168 hours. CBC: Recent Labs  Lab 05/11/18 1441 05/12/18 0338 05/13/18 0344  WBC 21.6* 14.0* 8.8  NEUTROABS 18.6*  --   --   HGB 11.2* 11.2* 11.5*  HCT 35.0* 34.8* 36.9*  MCV 92.3 91.8 92.7  PLT 251 276 287   Cardiac Enzymes: Recent Labs  Lab 05/11/18 1607 05/11/18 2152 05/12/18 0338 05/12/18 0948  CKTOTAL 102  --   --   --   TROPONINI  --  <0.03 <0.03 <0.03   BNP: Invalid input(s): POCBNP CBG: Recent Labs  Lab 05/13/18 0724 05/13/18 1221 05/13/18 1619 05/13/18 2045 05/14/18 0801  GLUCAP 84 129* 147* 163* 118*   D-Dimer No results for input(s): DDIMER in the last 72 hours. Hgb A1c No results for input(s): HGBA1C in the last 72 hours. Lipid Profile No results for input(s): CHOL, HDL, LDLCALC, TRIG, CHOLHDL, LDLDIRECT in the last 72 hours. Thyroid function studies No results for input(s): TSH, T4TOTAL, T3FREE, THYROIDAB in the last 72 hours.  Invalid input(s): FREET3 Anemia work up No results for input(s): VITAMINB12, FOLATE, FERRITIN, TIBC, IRON, RETICCTPCT  in the last 72 hours. Urinalysis    Component Value Date/Time   COLORURINE YELLOW 05/11/2018 1930   APPEARANCEUR HAZY (A) 05/11/2018 1930   LABSPEC 1.010 05/11/2018 1930   PHURINE 5.0 05/11/2018 1930   GLUCOSEU NEGATIVE 05/11/2018 1930   HGBUR MODERATE (A) 05/11/2018 1930   BILIRUBINUR NEGATIVE 05/11/2018 1930   BILIRUBINUR negative 12/30/2016 1308   KETONESUR NEGATIVE 05/11/2018 1930   PROTEINUR NEGATIVE 05/11/2018 1930   UROBILINOGEN 0.2 12/30/2016 1308   UROBILINOGEN 1.0 11/22/2012 1633   NITRITE POSITIVE (A) 05/11/2018 1930   LEUKOCYTESUR MODERATE (A) 05/11/2018 1930   Sepsis Labs Invalid input(s): PROCALCITONIN,  WBC,  LACTICIDVEN Microbiology Recent Results (from the past 240 hour(s))  Blood culture (routine x 2)     Status: None (Preliminary result)   Collection Time: 05/11/18  4:08 PM  Result Value Ref Range Status   Specimen Description   Final    BLOOD LEFT ANTECUBITAL Performed at [facility], [place] [address] [place], [place] [address]    Special Requests  Final    BOTTLES DRAWN AEROBIC AND ANAEROBIC Blood Culture results may not be optimal due to an inadequate volume of blood received in culture bottles Performed at [facility], [place] [address] [place], [place] [address]    Culture   Final    NO GROWTH 3 DAYS Performed at [facility], [place] [address] [place], [place] [address]    Report Status PENDING  Incomplete  Blood culture (routine x 2)     Status: None (Preliminary result)   Collection Time: 05/11/18  4:08 PM  Result Value Ref Range Status   Specimen Description   Final    BLOOD RIGHT ANTECUBITAL Performed at [facility] [address] [place], [place] [address]    Special Requests   Final    BOTTLES DRAWN AEROBIC AND ANAEROBIC Blood Culture results may not be optimal due to an inadequate volume of blood received in culture bottles Performed at [facility] [address], [place], [place] [address]    Culture   Final    NO GROWTH 3 DAYS Performed at [facility], [place] [address] [place], [place] [address]    Report Status PENDING  Incomplete  Culture, Urine     Status: Abnormal   Collection Time: 05/11/18  7:30 PM  Result Value Ref Range Status   Specimen Description   Final    URINE, RANDOM Performed at [facility] [address], [place], [place] [address]    Special Requests   Final    NONE Performed at [facility], [place] [address] [place], [place] [address]    Culture >=100,000 COLONIES/mL CITROBACTER FREUNDII (A)  Final   Report Status 05/14/2018 FINAL  Final   Organism ID, Bacteria CITROBACTER FREUNDII (A)  Final      Susceptibility   Citrobacter freundii - MIC*    CEFAZOLIN >=64 RESISTANT Resistant     CEFTRIAXONE <=1 SENSITIVE Sensitive     CIPROFLOXACIN <=0.25 SENSITIVE Sensitive     GENTAMICIN <=1 SENSITIVE Sensitive     IMIPENEM 0.5 SENSITIVE Sensitive     NITROFURANTOIN <=16 SENSITIVE Sensitive     TRIMETH/SULFA <=20 SENSITIVE Sensitive     PIP/TAZO <=4 SENSITIVE Sensitive     * >=100,000 COLONIES/mL CITROBACTER FREUNDII  MRSA PCR Screening     Status: None   Collection Time: 05/11/18 10:47 PM  Result Value Ref Range Status   MRSA by PCR NEGATIVE NEGATIVE Final    Comment:        The GeneXpert MRSA Assay (FDA approved for NASAL specimens only), is one component of a comprehensive MRSA colonization surveillance program. It is not intended to diagnose MRSA infection nor to guide or monitor treatment for MRSA infections. Performed at [facility], [place] [address], [place], [place] [address]      Time coordinating discharge: 36 minutes  SIGNED:   Georgette Shell, MD  [facility] 05/14/2018, 10:48 AM Pager   If 7PM-7AM, please contact night-coverage [email or web address] Password [email or web address]

## 2018-05-14 NOTE — Care Management Important Message (Signed)
Important Message  Patient Details  Name: [name] MRN: [id number] Date of Birth: 08-02-1932   Medicare Important Message Given:  Yes    Kerin Salen 05/14/2018, 11:13 AMImportant Message  Patient Details  Name: [name] MRN: [id number] Date of Birth: 1932/02/09   Medicare Important Message Given:  Yes    Kerin Salen 05/14/2018, 11:12 AM

## 2018-05-14 NOTE — Care Management Note (Signed)
Case Management Note  Patient Details  Name: [name] MRN: [id number] Date of Birth: [birth year]  Subjective/Objective:                  Discharge note  Action/Plan: No cm needs present at time of dc  Expected Discharge Date:  05/14/18               Expected Discharge Plan:  Home/Self Care  In-House Referral:     Discharge planning Services  CM Consult  Post Acute Care Choice:    Choice offered to:     DME Arranged:    DME Agency:     HH Arranged:    [place] Agency:     Status of Service:  Completed, signed off  If discussed at [employer] of Stay Meetings, dates discussed:    Additional Comments:  Leeroy Cha, RN 05/14/2018, 11:29 AM

## 2018-05-15 ENCOUNTER — Telehealth: Payer: Self-pay | Admitting: Emergency Medicine

## 2018-05-15 ENCOUNTER — Telehealth: Payer: Self-pay | Admitting: General Practice

## 2018-05-15 LAB — BLOOD CULTURE ID PANEL (REFLEXED)
Acinetobacter baumannii: NOT DETECTED
CANDIDA TROPICALIS: NOT DETECTED
Candida albicans: NOT DETECTED
Candida glabrata: NOT DETECTED
Candida krusei: NOT DETECTED
Candida parapsilosis: NOT DETECTED
ENTEROBACTERIACEAE SPECIES: NOT DETECTED
ENTEROCOCCUS SPECIES: NOT DETECTED
ESCHERICHIA COLI: NOT DETECTED
Enterobacter cloacae complex: NOT DETECTED
Haemophilus influenzae: NOT DETECTED
KLEBSIELLA OXYTOCA: NOT DETECTED
Klebsiella pneumoniae: NOT DETECTED
Listeria monocytogenes: NOT DETECTED
Neisseria meningitidis: NOT DETECTED
PSEUDOMONAS AERUGINOSA: NOT DETECTED
Proteus species: NOT DETECTED
STAPHYLOCOCCUS AUREUS BCID: NOT DETECTED
STAPHYLOCOCCUS SPECIES: NOT DETECTED
STREPTOCOCCUS SPECIES: NOT DETECTED
Serratia marcescens: NOT DETECTED
Streptococcus agalactiae: NOT DETECTED
Streptococcus pneumoniae: NOT DETECTED
Streptococcus pyogenes: NOT DETECTED

## 2018-05-15 NOTE — Telephone Encounter (Signed)
Transition Care Management Follow-up Telephone Call   Date discharged? 05/16/2018   How have you been since you were released from the hospital? I am doing okay, I just overloaded myself with my medications    Do you understand why you were in the hospital? yes   Do you understand the discharge instructions? yes   Where were you discharged to? Home    Items Reviewed:  Medications reviewed: yes  Allergies reviewed: yes  Dietary changes reviewed: yes  Referrals reviewed:    Functional Questionnaire:   Activities of Daily Living (ADLs):   He states they are independent in the following: ambulation, bathing and hygiene, feeding, continence, grooming, toileting and dressing States they require assistance with the following: Patient states he has trouble getting around.     Any transportation issues/concerns?: yes   Any patient concerns? no   Confirmed importance and date/time of follow-up visits scheduled yes  Provider Appointment booked with Dr. Birdie Riddle on 05/18/2018 at 11:00am  Confirmed with patient if condition begins to worsen call PCP or go to the ER.  Patient was given the office number and encouraged to call back with question or concerns.  : yes

## 2018-05-15 NOTE — Telephone Encounter (Signed)
Pt is being appropriately treated w/ Cipro- no med changes at this time

## 2018-05-15 NOTE — Telephone Encounter (Signed)
[facility] called with a critical lab for patient. Advised that he had a positive blood culture. The gram stain for one tube came back with Gram variable rod only

## 2018-05-15 NOTE — Telephone Encounter (Signed)
Patient notified of PCP recommendations and is agreement and expresses an understanding.   Ok for PEC to Discuss results / PCP recommendations / Schedule patient.   

## 2018-05-16 LAB — CULTURE, BLOOD (ROUTINE X 2): Culture: NO GROWTH

## 2018-05-18 ENCOUNTER — Encounter: Payer: Self-pay | Admitting: Family Medicine

## 2018-05-18 ENCOUNTER — Other Ambulatory Visit: Payer: Self-pay

## 2018-05-18 ENCOUNTER — Ambulatory Visit (INDEPENDENT_AMBULATORY_CARE_PROVIDER_SITE_OTHER): Payer: Medicare Other | Admitting: Family Medicine

## 2018-05-18 VITALS — BP 129/74 | HR 79 | Temp 98.0°F | Resp 16 | Ht 70.0 in | Wt 261.0 lb

## 2018-05-18 DIAGNOSIS — D72829 Elevated white blood cell count, unspecified: Secondary | ICD-10-CM | POA: Diagnosis not present

## 2018-05-18 DIAGNOSIS — N179 Acute kidney failure, unspecified: Secondary | ICD-10-CM

## 2018-05-18 DIAGNOSIS — F4312 Post-traumatic stress disorder, chronic: Secondary | ICD-10-CM | POA: Diagnosis not present

## 2018-05-18 DIAGNOSIS — I959 Hypotension, unspecified: Secondary | ICD-10-CM | POA: Diagnosis not present

## 2018-05-18 DIAGNOSIS — R600 Localized edema: Secondary | ICD-10-CM

## 2018-05-18 LAB — CBC WITH DIFFERENTIAL/PLATELET
BASOS ABS: 0 10*3/uL (ref 0.0–0.1)
Basophils Relative: 0.4 % (ref 0.0–3.0)
Eosinophils Absolute: 0.5 10*3/uL (ref 0.0–0.7)
Eosinophils Relative: 4 % (ref 0.0–5.0)
HCT: 37.7 % — ABNORMAL LOW (ref 39.0–52.0)
Hemoglobin: 12.3 g/dL — ABNORMAL LOW (ref 13.0–17.0)
LYMPHS ABS: 2.9 10*3/uL (ref 0.7–4.0)
Lymphocytes Relative: 22.8 % (ref 12.0–46.0)
MCHC: 32.6 g/dL (ref 30.0–36.0)
MCV: 88.8 fl (ref 78.0–100.0)
MONO ABS: 1 10*3/uL (ref 0.1–1.0)
Monocytes Relative: 8 % (ref 3.0–12.0)
NEUTROS ABS: 8.3 10*3/uL — AB (ref 1.4–7.7)
NEUTROS PCT: 64.8 % (ref 43.0–77.0)
PLATELETS: 329 10*3/uL (ref 150.0–400.0)
RBC: 4.24 Mil/uL (ref 4.22–5.81)
RDW: 13 % (ref 11.5–15.5)
WBC: 12.8 10*3/uL — ABNORMAL HIGH (ref 4.0–10.5)

## 2018-05-18 LAB — BASIC METABOLIC PANEL
BUN: 17 mg/dL (ref 6–23)
CALCIUM: 9 mg/dL (ref 8.4–10.5)
CO2: 29 meq/L (ref 19–32)
Chloride: 105 mEq/L (ref 96–112)
Creatinine, Ser: 0.91 mg/dL (ref 0.40–1.50)
GFR: 84.36 mL/min (ref >60.00)
GLUCOSE: 101 mg/dL — AB (ref 70–99)
Potassium: 5.5 mEq/L — ABNORMAL HIGH (ref 3.5–5.1)
Sodium: 137 mEq/L (ref 135–145)

## 2018-05-18 LAB — CULTURE, BLOOD (ROUTINE X 2)

## 2018-05-18 MED ORDER — TORSEMIDE 20 MG PO TABS: 20.0000 mg | ORAL_TABLET | Freq: Two times a day (BID) | ORAL | 1 refills | Status: DC

## 2018-05-18 NOTE — Patient Instructions (Addendum)
We'll notify you of your lab results and make any changes if needed Restart the Torsemide twice daily (your fluid pill) Restart the Quetiapine nightly (for PTSD and sleep) [facility]- it is important that everyone is on the same page You will need to get any pain medications from either the [facility] or the [place]- I do not do routine pain medication Call with any questions or concerns Hang in there!!!

## 2018-05-18 NOTE — Progress Notes (Signed)
   Subjective:    Patient ID: [name], male    DOB: 12-07-1931, 84 y.o.   MRN: [id number]  [facility] f/u- pt was admitted 9/13-16 for AMS w/ ARF due to UTI.  He was treated appropriately w/ Cipro and has 3 days remaining.  He also had + blood culture x1 but this is also treated w/ Cipro.  While he was hospitalized they stopped his Seroquel, Dilaudid, Imdur, Lisinopril, Torsemide, and Flomax due to mental status changes and 'soft' BP.  Protonix were also stopped and Lantus was decreased to 10 units daily.  Reviewed H&P, D/C summary, lab results.  Pt is very upset with his medication changes.  'They took my PTSD medications away from me and this Trazodone doesn't work worth a darn'.  Has had increased LE edema since stopping the Torsemide.  Pt indicates that no reasons were given for medication changes.  + decreased appetite.  Biggest complaint is ongoing leg pain.  Has orthopedic appt scheduled for 9/24.  Reviewed past medical, surgical, family and social histories.    Review of Systems For ROS see HPI     Objective:   Physical Exam  Constitutional: He is oriented to person, place, and time. He appears well-developed and well-nourished. No distress.  HENT:  Head: Normocephalic and atraumatic.  Eyes: Pupils are equal, round, and reactive to light. Conjunctivae and EOM are normal.  Neck: Normal range of motion. Neck supple. No thyromegaly present.  Cardiovascular: Normal rate, regular rhythm, normal heart sounds and intact distal pulses.  No murmur heard. Pulmonary/Chest: Effort normal and breath sounds normal. No respiratory distress.  Abdominal: Soft. Bowel sounds are normal. He exhibits no distension.  Musculoskeletal: He exhibits edema (1+ edema bilaterally). He exhibits no tenderness.  Lymphadenopathy:    He has no cervical adenopathy.  Neurological: He is alert and oriented to person, place, and time. No cranial nerve deficit.  Skin: Skin is warm and dry.  Psychiatric: He has  a normal mood and affect. His behavior is normal.  Vitals reviewed.         Assessment & Plan:

## 2018-05-20 NOTE — Assessment & Plan Note (Signed)
Cr had improved by D/C but it was recommended to recheck today.  They stopped his Torsemide, Lisinopril due to ARF.  He received IVF.  Currently asymptomatic.  Will restart Torsemide to improve swelling.  Pt expressed understanding and is in agreement w/ plan.

## 2018-05-20 NOTE — Assessment & Plan Note (Signed)
Recurrent problem for pt.  Likely due to his UTI.  Repeat CBC today and monitor for improvement.

## 2018-05-20 NOTE — Assessment & Plan Note (Signed)
Chronic problem.  He is very upset that they stopped his Seroquel.  Will restart nightly.

## 2018-05-20 NOTE — Assessment & Plan Note (Signed)
Improved.  BP is normotensive today.  Will continue to hold Lisinopril temporarily and restart if needed.  Pt expressed understanding and is in agreement w/ plan.

## 2018-05-21 ENCOUNTER — Other Ambulatory Visit: Payer: Self-pay | Admitting: Family Medicine

## 2018-05-21 DIAGNOSIS — E875 Hyperkalemia: Secondary | ICD-10-CM

## 2018-05-21 DIAGNOSIS — D72829 Elevated white blood cell count, unspecified: Secondary | ICD-10-CM

## 2018-05-22 DIAGNOSIS — G894 Chronic pain syndrome: Secondary | ICD-10-CM | POA: Diagnosis not present

## 2018-05-24 ENCOUNTER — Other Ambulatory Visit (INDEPENDENT_AMBULATORY_CARE_PROVIDER_SITE_OTHER): Payer: Medicare Other

## 2018-05-24 DIAGNOSIS — E875 Hyperkalemia: Secondary | ICD-10-CM | POA: Diagnosis not present

## 2018-05-24 DIAGNOSIS — D72829 Elevated white blood cell count, unspecified: Secondary | ICD-10-CM

## 2018-05-24 LAB — BASIC METABOLIC PANEL
BUN: 39 mg/dL — ABNORMAL HIGH (ref 6–23)
CHLORIDE: 98 meq/L (ref 96–112)
CO2: 34 mEq/L — ABNORMAL HIGH (ref 19–32)
CREATININE: 1.49 mg/dL (ref 0.40–1.50)
Calcium: 9.2 mg/dL (ref 8.4–10.5)
GFR: 47.75 mL/min — ABNORMAL LOW (ref >60.00)
Glucose, Bld: 146 mg/dL — ABNORMAL HIGH (ref 70–99)
POTASSIUM: 4.1 meq/L (ref 3.5–5.1)
SODIUM: 140 meq/L (ref 135–145)

## 2018-05-24 LAB — CBC WITH DIFFERENTIAL/PLATELET
BASOS ABS: 0.1 10*3/uL (ref 0.0–0.1)
Basophils Relative: 0.5 % (ref 0.0–3.0)
EOS ABS: 0.5 10*3/uL (ref 0.0–0.7)
Eosinophils Relative: 3.7 % (ref 0.0–5.0)
HCT: 39.8 % (ref 39.0–52.0)
Hemoglobin: 13.1 g/dL (ref 13.0–17.0)
LYMPHS ABS: 2.6 10*3/uL (ref 0.7–4.0)
Lymphocytes Relative: 19.8 % (ref 12.0–46.0)
MCHC: 32.9 g/dL (ref 30.0–36.0)
MCV: 87.7 fl (ref 78.0–100.0)
MONO ABS: 1.2 10*3/uL — AB (ref 0.1–1.0)
Monocytes Relative: 9.6 % (ref 3.0–12.0)
Neutro Abs: 8.6 10*3/uL — ABNORMAL HIGH (ref 1.4–7.7)
Neutrophils Relative %: 66.4 % (ref 43.0–77.0)
Platelets: 376 10*3/uL (ref 150.0–400.0)
RBC: 4.53 Mil/uL (ref 4.22–5.81)
RDW: 13.3 % (ref 11.5–15.5)
WBC: 12.9 10*3/uL — ABNORMAL HIGH (ref 4.0–10.5)

## 2018-05-25 ENCOUNTER — Other Ambulatory Visit: Payer: Self-pay | Admitting: Family Medicine

## 2018-05-25 DIAGNOSIS — D72829 Elevated white blood cell count, unspecified: Secondary | ICD-10-CM

## 2018-05-25 DIAGNOSIS — N289 Disorder of kidney and ureter, unspecified: Secondary | ICD-10-CM

## 2018-05-28 DIAGNOSIS — R609 Edema, unspecified: Secondary | ICD-10-CM | POA: Diagnosis not present

## 2018-05-28 LAB — CBC AND DIFFERENTIAL
HEMATOCRIT: 42 (ref 41–53)
Hemoglobin: 13.3 — AB (ref 13.5–17.5)
Neutrophils Absolute: 8
Platelets: 368 (ref 150–399)
WBC: 13.4

## 2018-05-28 LAB — BASIC METABOLIC PANEL
BUN: 38 — AB (ref 4–21)
CREATININE: 1.2 (ref 0.6–1.3)
Glucose: 123
Potassium: 3.4 (ref 3.4–5.3)
Sodium: 136 — AB (ref 137–147)

## 2018-05-28 LAB — POCT INR: INR: 1.1 (ref 0.9–1.1)

## 2018-05-28 LAB — PROTIME-INR: PROTIME: 13.9 — AB (ref 10.0–13.8)

## 2018-06-05 ENCOUNTER — Encounter: Payer: Self-pay | Admitting: General Practice

## 2018-06-08 ENCOUNTER — Other Ambulatory Visit: Payer: Self-pay | Admitting: *Deleted

## 2018-06-08 DIAGNOSIS — R600 Localized edema: Secondary | ICD-10-CM

## 2018-06-08 DIAGNOSIS — N289 Disorder of kidney and ureter, unspecified: Secondary | ICD-10-CM

## 2018-06-08 DIAGNOSIS — E875 Hyperkalemia: Secondary | ICD-10-CM

## 2018-06-11 ENCOUNTER — Other Ambulatory Visit (INDEPENDENT_AMBULATORY_CARE_PROVIDER_SITE_OTHER): Payer: Medicare Other

## 2018-06-11 DIAGNOSIS — N289 Disorder of kidney and ureter, unspecified: Secondary | ICD-10-CM

## 2018-06-11 DIAGNOSIS — E875 Hyperkalemia: Secondary | ICD-10-CM | POA: Diagnosis not present

## 2018-06-11 DIAGNOSIS — R600 Localized edema: Secondary | ICD-10-CM

## 2018-06-11 LAB — CBC WITH DIFFERENTIAL/PLATELET
BASOS ABS: 0 10*3/uL (ref 0.0–0.1)
Basophils Relative: 0.2 % (ref 0.0–3.0)
Eosinophils Absolute: 0.6 10*3/uL (ref 0.0–0.7)
Eosinophils Relative: 5.2 % — ABNORMAL HIGH (ref 0.0–5.0)
HCT: 41.7 % (ref 39.0–52.0)
Hemoglobin: 13.6 g/dL (ref 13.0–17.0)
LYMPHS ABS: 3 10*3/uL (ref 0.7–4.0)
LYMPHS PCT: 25.5 % (ref 12.0–46.0)
MCHC: 32.5 g/dL (ref 30.0–36.0)
MCV: 89.3 fl (ref 78.0–100.0)
MONOS PCT: 8.5 % (ref 3.0–12.0)
Monocytes Absolute: 1 10*3/uL (ref 0.1–1.0)
NEUTROS ABS: 7.1 10*3/uL (ref 1.4–7.7)
NEUTROS PCT: 60.6 % (ref 43.0–77.0)
Platelets: 275 10*3/uL (ref 150.0–400.0)
RBC: 4.68 Mil/uL (ref 4.22–5.81)
RDW: 14 % (ref 11.5–15.5)
WBC: 11.8 10*3/uL — ABNORMAL HIGH (ref 4.0–10.5)

## 2018-06-11 LAB — BASIC METABOLIC PANEL
BUN: 26 mg/dL — AB (ref 6–23)
CHLORIDE: 106 meq/L (ref 96–112)
CO2: 33 meq/L — AB (ref 19–32)
CREATININE: 1 mg/dL (ref 0.40–1.50)
Calcium: 9.8 mg/dL (ref 8.4–10.5)
GFR: 75.65 mL/min (ref >60.00)
Glucose, Bld: 100 mg/dL — ABNORMAL HIGH (ref 70–99)
Potassium: 5 mEq/L (ref 3.5–5.1)
Sodium: 141 mEq/L (ref 135–145)

## 2018-06-13 DIAGNOSIS — M47816 Spondylosis without myelopathy or radiculopathy, lumbar region: Secondary | ICD-10-CM | POA: Diagnosis not present

## 2018-06-13 DIAGNOSIS — G894 Chronic pain syndrome: Secondary | ICD-10-CM | POA: Diagnosis not present

## 2018-06-13 DIAGNOSIS — M5136 Other intervertebral disc degeneration, lumbar region: Secondary | ICD-10-CM | POA: Diagnosis not present

## 2018-06-13 DIAGNOSIS — M1712 Unilateral primary osteoarthritis, left knee: Secondary | ICD-10-CM | POA: Diagnosis not present

## 2018-06-13 DIAGNOSIS — E1142 Type 2 diabetes mellitus with diabetic polyneuropathy: Secondary | ICD-10-CM | POA: Diagnosis not present

## 2018-06-13 DIAGNOSIS — M545 Low back pain: Secondary | ICD-10-CM | POA: Diagnosis not present

## 2018-06-14 DIAGNOSIS — M25562 Pain in left knee: Secondary | ICD-10-CM | POA: Diagnosis not present

## 2018-06-14 DIAGNOSIS — M5442 Lumbago with sciatica, left side: Secondary | ICD-10-CM | POA: Diagnosis not present

## 2018-06-14 DIAGNOSIS — M1990 Unspecified osteoarthritis, unspecified site: Secondary | ICD-10-CM | POA: Diagnosis not present

## 2018-06-14 DIAGNOSIS — M5126 Other intervertebral disc displacement, lumbar region: Secondary | ICD-10-CM | POA: Diagnosis not present

## 2018-06-14 DIAGNOSIS — M1712 Unilateral primary osteoarthritis, left knee: Secondary | ICD-10-CM | POA: Diagnosis not present

## 2018-06-14 DIAGNOSIS — M5136 Other intervertebral disc degeneration, lumbar region: Secondary | ICD-10-CM | POA: Diagnosis not present

## 2018-06-14 DIAGNOSIS — M47896 Other spondylosis, lumbar region: Secondary | ICD-10-CM | POA: Diagnosis not present

## 2018-06-14 DIAGNOSIS — M549 Dorsalgia, unspecified: Secondary | ICD-10-CM | POA: Diagnosis not present

## 2018-06-14 DIAGNOSIS — M47817 Spondylosis without myelopathy or radiculopathy, lumbosacral region: Secondary | ICD-10-CM | POA: Diagnosis not present

## 2018-06-14 DIAGNOSIS — M4726 Other spondylosis with radiculopathy, lumbar region: Secondary | ICD-10-CM | POA: Diagnosis not present

## 2018-06-14 DIAGNOSIS — M5116 Intervertebral disc disorders with radiculopathy, lumbar region: Secondary | ICD-10-CM | POA: Diagnosis not present

## 2018-06-14 DIAGNOSIS — M48061 Spinal stenosis, lumbar region without neurogenic claudication: Secondary | ICD-10-CM | POA: Diagnosis not present

## 2018-06-14 DIAGNOSIS — Z23 Encounter for immunization: Secondary | ICD-10-CM | POA: Diagnosis not present

## 2018-06-14 DIAGNOSIS — M5386 Other specified dorsopathies, lumbar region: Secondary | ICD-10-CM | POA: Diagnosis not present

## 2018-06-14 DIAGNOSIS — M48062 Spinal stenosis, lumbar region with neurogenic claudication: Secondary | ICD-10-CM | POA: Diagnosis not present

## 2018-06-16 DIAGNOSIS — F17220 Nicotine dependence, chewing tobacco, uncomplicated: Secondary | ICD-10-CM | POA: Diagnosis present

## 2018-06-16 DIAGNOSIS — R29898 Other symptoms and signs involving the musculoskeletal system: Secondary | ICD-10-CM | POA: Diagnosis not present

## 2018-06-16 DIAGNOSIS — M5489 Other dorsalgia: Secondary | ICD-10-CM | POA: Diagnosis not present

## 2018-06-16 DIAGNOSIS — Z794 Long term (current) use of insulin: Secondary | ICD-10-CM | POA: Diagnosis not present

## 2018-06-16 DIAGNOSIS — Z86711 Personal history of pulmonary embolism: Secondary | ICD-10-CM | POA: Diagnosis not present

## 2018-06-16 DIAGNOSIS — Z716 Tobacco abuse counseling: Secondary | ICD-10-CM | POA: Diagnosis not present

## 2018-06-16 DIAGNOSIS — E1142 Type 2 diabetes mellitus with diabetic polyneuropathy: Secondary | ICD-10-CM | POA: Diagnosis present

## 2018-06-16 DIAGNOSIS — M47896 Other spondylosis, lumbar region: Secondary | ICD-10-CM | POA: Diagnosis not present

## 2018-06-16 DIAGNOSIS — M7138 Other bursal cyst, other site: Secondary | ICD-10-CM | POA: Diagnosis not present

## 2018-06-16 DIAGNOSIS — Z9889 Other specified postprocedural states: Secondary | ICD-10-CM | POA: Diagnosis not present

## 2018-06-16 DIAGNOSIS — M5136 Other intervertebral disc degeneration, lumbar region: Secondary | ICD-10-CM | POA: Diagnosis not present

## 2018-06-16 DIAGNOSIS — I10 Essential (primary) hypertension: Secondary | ICD-10-CM | POA: Diagnosis present

## 2018-06-16 DIAGNOSIS — G5603 Carpal tunnel syndrome, bilateral upper limbs: Secondary | ICD-10-CM | POA: Diagnosis present

## 2018-06-16 DIAGNOSIS — M4726 Other spondylosis with radiculopathy, lumbar region: Secondary | ICD-10-CM | POA: Diagnosis present

## 2018-06-16 DIAGNOSIS — Z993 Dependence on wheelchair: Secondary | ICD-10-CM | POA: Diagnosis not present

## 2018-06-16 DIAGNOSIS — M2550 Pain in unspecified joint: Secondary | ICD-10-CM | POA: Diagnosis not present

## 2018-06-16 DIAGNOSIS — Z7401 Bed confinement status: Secondary | ICD-10-CM | POA: Diagnosis not present

## 2018-06-16 DIAGNOSIS — E669 Obesity, unspecified: Secondary | ICD-10-CM | POA: Diagnosis present

## 2018-06-16 DIAGNOSIS — M48062 Spinal stenosis, lumbar region with neurogenic claudication: Secondary | ICD-10-CM | POA: Diagnosis present

## 2018-06-16 DIAGNOSIS — M5126 Other intervertebral disc displacement, lumbar region: Secondary | ICD-10-CM | POA: Diagnosis not present

## 2018-06-16 DIAGNOSIS — Z23 Encounter for immunization: Secondary | ICD-10-CM | POA: Diagnosis not present

## 2018-06-16 DIAGNOSIS — Z885 Allergy status to narcotic agent status: Secondary | ICD-10-CM | POA: Diagnosis not present

## 2018-06-16 DIAGNOSIS — E1121 Type 2 diabetes mellitus with diabetic nephropathy: Secondary | ICD-10-CM | POA: Diagnosis present

## 2018-06-16 DIAGNOSIS — M48061 Spinal stenosis, lumbar region without neurogenic claudication: Secondary | ICD-10-CM | POA: Diagnosis not present

## 2018-06-16 DIAGNOSIS — M5386 Other specified dorsopathies, lumbar region: Secondary | ICD-10-CM | POA: Diagnosis present

## 2018-06-16 DIAGNOSIS — Z79899 Other long term (current) drug therapy: Secondary | ICD-10-CM | POA: Diagnosis not present

## 2018-06-16 DIAGNOSIS — M5116 Intervertebral disc disorders with radiculopathy, lumbar region: Secondary | ICD-10-CM | POA: Diagnosis present

## 2018-06-16 DIAGNOSIS — M545 Low back pain: Secondary | ICD-10-CM | POA: Diagnosis not present

## 2018-06-16 DIAGNOSIS — M47817 Spondylosis without myelopathy or radiculopathy, lumbosacral region: Secondary | ICD-10-CM | POA: Diagnosis not present

## 2018-06-16 DIAGNOSIS — F4312 Post-traumatic stress disorder, chronic: Secondary | ICD-10-CM | POA: Diagnosis present

## 2018-06-16 DIAGNOSIS — E1165 Type 2 diabetes mellitus with hyperglycemia: Secondary | ICD-10-CM | POA: Diagnosis present

## 2018-06-16 DIAGNOSIS — E785 Hyperlipidemia, unspecified: Secondary | ICD-10-CM | POA: Diagnosis present

## 2018-06-16 DIAGNOSIS — G4733 Obstructive sleep apnea (adult) (pediatric): Secondary | ICD-10-CM | POA: Diagnosis present

## 2018-06-16 DIAGNOSIS — Z6837 Body mass index (BMI) 37.0-37.9, adult: Secondary | ICD-10-CM | POA: Diagnosis not present

## 2018-06-16 DIAGNOSIS — M1990 Unspecified osteoarthritis, unspecified site: Secondary | ICD-10-CM | POA: Diagnosis present

## 2018-06-20 MED ORDER — SODIUM CHLORIDE 0.9 % IV SOLN
10.00 | INTRAVENOUS | Status: DC
Start: ? — End: 2018-06-20

## 2018-06-20 MED ORDER — ACETAMINOPHEN 325 MG PO TABS
650.00 | ORAL_TABLET | ORAL | Status: DC
Start: ? — End: 2018-06-20

## 2018-06-20 MED ORDER — ACETAMINOPHEN 650 MG RE SUPP
650.00 | RECTAL | Status: DC
Start: ? — End: 2018-06-20

## 2018-06-20 MED ORDER — INSULIN GLARGINE 100 UNIT/ML ~~LOC~~ SOLN
1.00 | SUBCUTANEOUS | Status: DC
Start: ? — End: 2018-06-20

## 2018-06-20 MED ORDER — SENNA-DOCUSATE SODIUM 8.6-50 MG PO TABS
1.00 | ORAL_TABLET | ORAL | Status: DC
Start: 2018-06-18 — End: 2018-06-20

## 2018-06-20 MED ORDER — ENOXAPARIN SODIUM 40 MG/0.4ML ~~LOC~~ SOLN
40.00 | SUBCUTANEOUS | Status: DC
Start: ? — End: 2018-06-20

## 2018-06-20 MED ORDER — CARVEDILOL 3.125 MG PO TABS
3.13 | ORAL_TABLET | ORAL | Status: DC
Start: 2018-06-18 — End: 2018-06-20

## 2018-06-20 MED ORDER — GENERIC EXTERNAL MEDICATION
1.00 | Status: DC
Start: ? — End: 2018-06-20

## 2018-06-20 MED ORDER — TRAZODONE HCL 50 MG PO TABS
50.00 | ORAL_TABLET | ORAL | Status: DC
Start: 2018-06-18 — End: 2018-06-20

## 2018-06-20 MED ORDER — PROMETHAZINE HCL 25 MG/ML IJ SOLN
12.50 | INTRAMUSCULAR | Status: DC
Start: ? — End: 2018-06-20

## 2018-06-20 MED ORDER — DOCUSATE SODIUM 100 MG PO CAPS
100.00 | ORAL_CAPSULE | ORAL | Status: DC
Start: 2018-06-18 — End: 2018-06-20

## 2018-06-20 MED ORDER — ATORVASTATIN CALCIUM 20 MG PO TABS
20.00 | ORAL_TABLET | ORAL | Status: DC
Start: 2018-06-19 — End: 2018-06-20

## 2018-06-20 MED ORDER — GALANTAMINE HYDROBROMIDE 8 MG PO TABS
8.00 | ORAL_TABLET | ORAL | Status: DC
Start: 2018-06-18 — End: 2018-06-20

## 2018-06-20 MED ORDER — HYDROCODONE-ACETAMINOPHEN 7.5-325 MG PO TABS
1.00 | ORAL_TABLET | ORAL | Status: DC
Start: ? — End: 2018-06-20

## 2018-06-20 MED ORDER — INSULIN GLARGINE 100 UNIT/ML ~~LOC~~ SOLN
1.00 | SUBCUTANEOUS | Status: DC
Start: 2018-06-18 — End: 2018-06-20

## 2018-06-20 MED ORDER — INSULIN LISPRO 100 UNIT/ML ~~LOC~~ SOLN
1.00 | SUBCUTANEOUS | Status: DC
Start: 2018-06-18 — End: 2018-06-20

## 2018-06-20 MED ORDER — HYDROMORPHONE HCL 1 MG/ML IJ SOLN
0.20 | INTRAMUSCULAR | Status: DC
Start: ? — End: 2018-06-20

## 2018-06-20 MED ORDER — ONDANSETRON HCL 4 MG/2ML IJ SOLN
4.00 | INTRAMUSCULAR | Status: DC
Start: ? — End: 2018-06-20

## 2018-06-20 MED ORDER — LISINOPRIL 20 MG PO TABS
20.00 | ORAL_TABLET | ORAL | Status: DC
Start: 2018-06-19 — End: 2018-06-20

## 2018-06-20 MED ORDER — GENERIC EXTERNAL MEDICATION
10.00 | Status: DC
Start: ? — End: 2018-06-20

## 2018-06-20 MED ORDER — MAGNESIUM CITRATE PO SOLN
150.00 | ORAL | Status: DC
Start: ? — End: 2018-06-20

## 2018-06-20 MED ORDER — TORSEMIDE 20 MG PO TABS
20.00 | ORAL_TABLET | ORAL | Status: DC
Start: 2018-06-19 — End: 2018-06-20

## 2018-06-20 MED ORDER — CYCLOBENZAPRINE HCL 10 MG PO TABS
10.00 | ORAL_TABLET | ORAL | Status: DC
Start: ? — End: 2018-06-20

## 2018-06-20 MED ORDER — INSULIN LISPRO 100 UNIT/ML ~~LOC~~ SOLN
1.00 | SUBCUTANEOUS | Status: DC
Start: ? — End: 2018-06-20

## 2018-06-20 MED ORDER — LACTATED RINGERS IV SOLN
75.00 | INTRAVENOUS | Status: DC
Start: ? — End: 2018-06-20

## 2018-06-20 MED ORDER — QUETIAPINE FUMARATE 25 MG PO TABS
50.00 | ORAL_TABLET | ORAL | Status: DC
Start: 2018-06-18 — End: 2018-06-20

## 2018-06-20 MED ORDER — MAGNESIUM OXIDE 400 MG PO TABS
400.00 | ORAL_TABLET | ORAL | Status: DC
Start: 2018-06-18 — End: 2018-06-20

## 2018-07-06 ENCOUNTER — Telehealth: Payer: Self-pay | Admitting: *Deleted

## 2018-07-06 NOTE — Telephone Encounter (Signed)
I have reached out to the [place] to discuss medication with them.   The receptionist marked my call as Urgent and is asking that they call me.
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
## 2018-07-23 DIAGNOSIS — M7138 Other bursal cyst, other site: Secondary | ICD-10-CM | POA: Diagnosis not present

## 2018-07-23 DIAGNOSIS — Z48811 Encounter for surgical aftercare following surgery on the nervous system: Secondary | ICD-10-CM | POA: Diagnosis not present

## 2018-07-23 DIAGNOSIS — M48062 Spinal stenosis, lumbar region with neurogenic claudication: Secondary | ICD-10-CM | POA: Diagnosis not present

## 2018-07-31 NOTE — Telephone Encounter (Signed)
Nurse from the [place] returned my call. She states that they had no idea about patient overdosing and struggling with medication. Advised the dates that this happened and she is going to request records from [facility] ED so that they can be addressed with his provider at the [place].  She states that they did send him medication again in November, but that she would be addressing this with his provider.    Routing to Dr. Birdie Riddle as Juluis Rainier

## 2018-09-03 LAB — HM DIABETES FOOT EXAM

## 2018-09-07 ENCOUNTER — Ambulatory Visit: Payer: Medicare Other | Admitting: Family Medicine

## 2018-09-10 ENCOUNTER — Encounter: Payer: Self-pay | Admitting: Family Medicine

## 2018-09-10 ENCOUNTER — Ambulatory Visit (INDEPENDENT_AMBULATORY_CARE_PROVIDER_SITE_OTHER): Payer: Medicare Other | Admitting: Family Medicine

## 2018-09-10 ENCOUNTER — Other Ambulatory Visit: Payer: Self-pay

## 2018-09-10 VITALS — BP 126/78 | HR 81 | Temp 98.1°F | Resp 17 | Ht 70.0 in

## 2018-09-10 DIAGNOSIS — I10 Essential (primary) hypertension: Secondary | ICD-10-CM | POA: Diagnosis not present

## 2018-09-10 DIAGNOSIS — E7849 Other hyperlipidemia: Secondary | ICD-10-CM | POA: Diagnosis not present

## 2018-09-10 DIAGNOSIS — E1121 Type 2 diabetes mellitus with diabetic nephropathy: Secondary | ICD-10-CM

## 2018-09-10 MED ORDER — GLUCOSE BLOOD VI STRP: ORAL_STRIP | 3 refills | Status: DC

## 2018-09-10 NOTE — Progress Notes (Signed)
   Subjective:    Patient ID: [name], male    DOB: [birth year], 85 y.o.   MRN: [id number]  HPI DM- chronic problem.  [facility].  He reports he is UTD on eye exam.  UTD on foot exam- sees podiatrist at [facility] q3 months (done last week).  Due for microalbumin.  Currently on Lantus, Humalog.  Last saw [facility] in August.  Hyperlipidemia- chronic problem, on Pravastatin w/ hx of good control.  Denies abd pain, N/V  HTN- chronic problem, on Coreg 3.125 BID w/ good control.  Denies CP, SOB, HAs, visual changes.   Review of Systems For ROS see HPI     Objective:   Physical Exam Vitals signs reviewed.  Constitutional:      General: He is not in acute distress.    Appearance: Normal appearance. He is obese.  HENT:     Head: Normocephalic and atraumatic.  Cardiovascular:     Rate and Rhythm: Normal rate and regular rhythm.     Heart sounds: Normal heart sounds. No murmur.  Pulmonary:     Effort: Pulmonary effort is normal. No respiratory distress.     Breath sounds: Normal breath sounds. No wheezing or rales.  Abdominal:     General: Bowel sounds are normal. There is no distension.     Palpations: Abdomen is soft.     Tenderness: There is no abdominal tenderness. There is no guarding.  Musculoskeletal:     Right lower leg: Edema (chronic) present.     Left lower leg: Edema (chronic) present.  Skin:    General: Skin is warm and dry.  Neurological:     Mental Status: He is alert.  Psychiatric:        Mood and Affect: Mood normal.        Behavior: Behavior normal.        Thought Content: Thought content normal.           Assessment & Plan:

## 2018-09-10 NOTE — Assessment & Plan Note (Signed)
Chronic problem.  Tolerating statin w/o difficulty.  Check labs.  Adjust meds prn  

## 2018-09-10 NOTE — Assessment & Plan Note (Signed)
Chronic problem.  Well controlled.  Asymptomatic.  Check labs.  No anticipated med changes.  Will follow. 

## 2018-09-10 NOTE — Assessment & Plan Note (Signed)
Chronic problem.  [facility] so it is very hard to keep track of his care.  UTD on eye exam and foot exam per report.  He states he had urine done at [facility].  Stressed need for healthy diet as pt is unable to exercise.  Check labs and forward results to [place].

## 2018-09-10 NOTE — Patient Instructions (Signed)
Follow up in 6 months to recheck BP, cholesterol, and diabetes We'll notify you of your lab results and make any changes if needed Continue to work on healthy diet! Please have the [facility] send me copies of your labs, eye exams, foot exams, etc Call with any questions or concerns Happy New Year!

## 2018-09-10 NOTE — Assessment & Plan Note (Signed)
Ongoing issue for pt.  Check labs to risk stratify.  Stressed need for healthy diet as pt is not able to exercise.  Will follow.

## 2018-09-11 LAB — LIPID PANEL
CHOL/HDL RATIO: 4
Cholesterol: 159 mg/dL (ref 0–200)
HDL: 44.6 mg/dL (ref >39.00)
LDL Cholesterol: 89 mg/dL (ref 0–99)
NONHDL: 113.94
Triglycerides: 126 mg/dL (ref 0.0–149.0)
VLDL: 25.2 mg/dL (ref 0.0–40.0)

## 2018-09-11 LAB — HEPATIC FUNCTION PANEL
ALT: 10 U/L (ref 0–53)
AST: 12 U/L (ref 0–37)
Albumin: 3.5 g/dL (ref 3.5–5.2)
Alkaline Phosphatase: 86 U/L (ref 39–117)
Bilirubin, Direct: 0.1 mg/dL (ref 0.0–0.3)
Total Bilirubin: 0.3 mg/dL (ref 0.2–1.2)
Total Protein: 6.3 g/dL (ref 6.0–8.3)

## 2018-09-11 LAB — CBC WITH DIFFERENTIAL/PLATELET
Basophils Absolute: 0.1 10*3/uL (ref 0.0–0.1)
Basophils Relative: 1.3 % (ref 0.0–3.0)
EOS ABS: 0.6 10*3/uL (ref 0.0–0.7)
EOS PCT: 5.2 % — AB (ref 0.0–5.0)
HCT: 41.3 % (ref 39.0–52.0)
Hemoglobin: 13.6 g/dL (ref 13.0–17.0)
Lymphocytes Relative: 27.8 % (ref 12.0–46.0)
Lymphs Abs: 2.9 10*3/uL (ref 0.7–4.0)
MCHC: 32.9 g/dL (ref 30.0–36.0)
MCV: 88.3 fl (ref 78.0–100.0)
MONO ABS: 1.1 10*3/uL — AB (ref 0.1–1.0)
Monocytes Relative: 10.1 % (ref 3.0–12.0)
Neutro Abs: 5.9 10*3/uL (ref 1.4–7.7)
Neutrophils Relative %: 55.6 % (ref 43.0–77.0)
Platelets: 267 10*3/uL (ref 150.0–400.0)
RBC: 4.67 Mil/uL (ref 4.22–5.81)
RDW: 14.1 % (ref 11.5–15.5)
WBC: 10.6 10*3/uL — ABNORMAL HIGH (ref 4.0–10.5)

## 2018-09-11 LAB — BASIC METABOLIC PANEL
BUN: 27 mg/dL — AB (ref 6–23)
CHLORIDE: 105 meq/L (ref 96–112)
CO2: 27 mEq/L (ref 19–32)
Calcium: 9.1 mg/dL (ref 8.4–10.5)
Creatinine, Ser: 1.09 mg/dL (ref 0.40–1.50)
GFR: 68.44 mL/min (ref >60.00)
Glucose, Bld: 159 mg/dL — ABNORMAL HIGH (ref 70–99)
POTASSIUM: 4.5 meq/L (ref 3.5–5.1)
Sodium: 140 mEq/L (ref 135–145)

## 2018-09-11 LAB — TSH: TSH: 2.72 u[IU]/mL (ref 0.35–4.50)

## 2018-09-11 LAB — HEMOGLOBIN A1C: Hgb A1c MFr Bld: 7.3 % — ABNORMAL HIGH (ref 4.6–6.5)

## 2018-09-12 ENCOUNTER — Encounter: Payer: Self-pay | Admitting: General Practice

## 2018-09-18 ENCOUNTER — Telehealth: Payer: Self-pay | Admitting: Family Medicine

## 2018-09-18 DIAGNOSIS — M549 Dorsalgia, unspecified: Secondary | ICD-10-CM

## 2018-09-18 DIAGNOSIS — M79671 Pain in right foot: Secondary | ICD-10-CM

## 2018-09-18 DIAGNOSIS — M25551 Pain in right hip: Secondary | ICD-10-CM

## 2018-09-18 NOTE — Telephone Encounter (Signed)
Please advise 

## 2018-09-18 NOTE — Telephone Encounter (Signed)
Copied from [place]. Topic: Referral - Request for Referral >> Sep 18, 2018 10:38 AM Rayann Heman wrote: Has patient seen PCP for this complaint? Yes Referral for which specialty: neurosurgery Preferred provider/office: any  Reason for referral: pt called and stated that he went to the [facility] and needed a referral to neurosurgery for hip and leg. Please advise

## 2018-09-19 NOTE — Telephone Encounter (Signed)
I don't understand the message.  1st- [facility] can do [facility] if needed  2nd- neurosurgery doesn't do hip/leg.  So we need more information from patient if he would like our help

## 2018-09-19 NOTE — Telephone Encounter (Signed)
Called and spoke with patient. Stated that he was advised by the [facility] he would need to be seen by an orthopedic surgeon for in regards to his right sided hip and foot pain. MRI results were placed in PCP folder for review. He would like to go to anyone besides [facility] as he was not fond of the foot surgery they had completed in the past.   Please advise

## 2018-09-24 NOTE — Telephone Encounter (Signed)
Referral placed and pt made aware.  

## 2018-09-24 NOTE — Telephone Encounter (Signed)
MRI results reviewed.  Again, unclear why [facility] is not treating his issues (as they have in the past).  But we can certainly refer to [facility] for back/hip/leg pain

## 2018-09-26 ENCOUNTER — Ambulatory Visit (INDEPENDENT_AMBULATORY_CARE_PROVIDER_SITE_OTHER): Payer: Medicare Other

## 2018-09-26 ENCOUNTER — Encounter (INDEPENDENT_AMBULATORY_CARE_PROVIDER_SITE_OTHER): Payer: Self-pay | Admitting: Orthopaedic Surgery

## 2018-09-26 ENCOUNTER — Ambulatory Visit (INDEPENDENT_AMBULATORY_CARE_PROVIDER_SITE_OTHER): Payer: Medicare Other | Admitting: Orthopaedic Surgery

## 2018-09-26 DIAGNOSIS — M79671 Pain in right foot: Secondary | ICD-10-CM

## 2018-09-26 DIAGNOSIS — M25551 Pain in right hip: Secondary | ICD-10-CM | POA: Diagnosis not present
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
## 2018-10-09 ENCOUNTER — Telehealth: Payer: Self-pay | Admitting: Family Medicine

## 2018-10-09 MED ORDER — TORSEMIDE 20 MG PO TABS: 20.0000 mg | ORAL_TABLET | Freq: Two times a day (BID) | ORAL | 0 refills | Status: DC

## 2018-10-09 NOTE — Telephone Encounter (Signed)
Copied from [place] [id number]. Topic: Quick Communication - Rx Refill/Question >> Oct 09, 2018 10:08 AM [name] wrote: Medication: torsemide (DEMADEX) 20 MG tablet only requesting 15 tablets because he called the [facility] and they will not get his full script to him until about 2 more weeks.    Has the patient contacted their pharmacy? Yes (Agent: If no, request that the patient contact the pharmacy for the refill.) (Agent: If yes, when and what did the pharmacy advise?)  Preferred Pharmacy (with phone number or street name): [id number] - [place], [place] - [address] [place] [address] [place] [address]    Agent: Please be advised that RX refills may take up to 3 business days. We ask that you follow-up with your pharmacy.

## 2018-10-10 NOTE — Telephone Encounter (Signed)
Pt informed. He picked them up today.

## 2018-10-10 NOTE — Telephone Encounter (Signed)
Called pt. He said that he does not want an appt. He just wants to know if we can send in a short term supply of his fluid pill until his mail order is delivered from the [facility].

## 2018-10-10 NOTE — Telephone Encounter (Signed)
Patients wife called and stated that patients right leg is swollen. Please advise

## 2018-10-10 NOTE — Telephone Encounter (Signed)
30 pills filled yesterday

## 2018-11-22 ENCOUNTER — Telehealth: Payer: Self-pay | Admitting: *Deleted

## 2018-11-22 DIAGNOSIS — Z79899 Other long term (current) drug therapy: Secondary | ICD-10-CM | POA: Diagnosis not present

## 2018-11-22 DIAGNOSIS — M1711 Unilateral primary osteoarthritis, right knee: Secondary | ICD-10-CM | POA: Diagnosis not present

## 2018-11-22 DIAGNOSIS — M5136 Other intervertebral disc degeneration, lumbar region: Secondary | ICD-10-CM | POA: Diagnosis not present

## 2018-11-22 DIAGNOSIS — M5186 Other intervertebral disc disorders, lumbar region: Secondary | ICD-10-CM | POA: Diagnosis not present

## 2018-11-22 DIAGNOSIS — Z885 Allergy status to narcotic agent status: Secondary | ICD-10-CM | POA: Diagnosis not present

## 2018-11-22 DIAGNOSIS — M47816 Spondylosis without myelopathy or radiculopathy, lumbar region: Secondary | ICD-10-CM | POA: Diagnosis not present

## 2018-11-22 DIAGNOSIS — M48061 Spinal stenosis, lumbar region without neurogenic claudication: Secondary | ICD-10-CM | POA: Diagnosis not present

## 2018-11-22 DIAGNOSIS — G8911 Acute pain due to trauma: Secondary | ICD-10-CM | POA: Diagnosis not present

## 2018-11-22 DIAGNOSIS — Z794 Long term (current) use of insulin: Secondary | ICD-10-CM | POA: Diagnosis not present

## 2018-11-22 DIAGNOSIS — W19XXXA Unspecified fall, initial encounter: Secondary | ICD-10-CM | POA: Diagnosis not present

## 2018-11-22 DIAGNOSIS — E119 Type 2 diabetes mellitus without complications: Secondary | ICD-10-CM | POA: Diagnosis not present

## 2018-11-22 DIAGNOSIS — R2689 Other abnormalities of gait and mobility: Secondary | ICD-10-CM | POA: Diagnosis not present

## 2018-11-22 DIAGNOSIS — M5489 Other dorsalgia: Secondary | ICD-10-CM | POA: Diagnosis not present

## 2018-11-22 DIAGNOSIS — R52 Pain, unspecified: Secondary | ICD-10-CM | POA: Diagnosis not present

## 2018-11-22 DIAGNOSIS — M545 Low back pain: Secondary | ICD-10-CM | POA: Diagnosis not present

## 2018-11-22 DIAGNOSIS — I10 Essential (primary) hypertension: Secondary | ICD-10-CM | POA: Diagnosis not present

## 2018-11-22 DIAGNOSIS — S80211A Abrasion, right knee, initial encounter: Secondary | ICD-10-CM | POA: Diagnosis not present

## 2018-11-22 NOTE — Telephone Encounter (Signed)
All elective imaging is on hold right now.  I would recommend he go through the [facility] and see if they are able to get this done

## 2018-11-22 NOTE — Telephone Encounter (Signed)
Spoke with patient's wife.  Patient went to ED today because he was in so much pain. Wife states that imaging was done there.

## 2018-11-22 NOTE — Telephone Encounter (Signed)
Copied from [place] [phone]. Topic: Referral - Request for Referral >> Nov 21, 2018  3:59 PM Reyne Dumas L wrote: Has patient seen PCP for this complaint? unknown *If NO, is insurance requiring patient see PCP for this issue before PCP can refer them? Referral for which specialty: imaging Preferred provider/office: MRI at [facility] Reason for referral: needs MRI for back surgery potentially being done by Dr. Gloriann Loan  Pt can be reached at [phone]

## 2018-11-26 ENCOUNTER — Telehealth: Payer: Self-pay | Admitting: Family Medicine

## 2018-11-26 DIAGNOSIS — E559 Vitamin D deficiency, unspecified: Secondary | ICD-10-CM | POA: Diagnosis not present

## 2018-11-26 DIAGNOSIS — E119 Type 2 diabetes mellitus without complications: Secondary | ICD-10-CM | POA: Diagnosis not present

## 2018-11-26 DIAGNOSIS — E669 Obesity, unspecified: Secondary | ICD-10-CM | POA: Diagnosis present

## 2018-11-26 DIAGNOSIS — E785 Hyperlipidemia, unspecified: Secondary | ICD-10-CM | POA: Diagnosis not present

## 2018-11-26 DIAGNOSIS — E1122 Type 2 diabetes mellitus with diabetic chronic kidney disease: Secondary | ICD-10-CM | POA: Diagnosis not present

## 2018-11-26 DIAGNOSIS — G894 Chronic pain syndrome: Secondary | ICD-10-CM | POA: Diagnosis present

## 2018-11-26 DIAGNOSIS — R29898 Other symptoms and signs involving the musculoskeletal system: Secondary | ICD-10-CM | POA: Diagnosis not present

## 2018-11-26 DIAGNOSIS — E1121 Type 2 diabetes mellitus with diabetic nephropathy: Secondary | ICD-10-CM | POA: Diagnosis not present

## 2018-11-26 DIAGNOSIS — N189 Chronic kidney disease, unspecified: Secondary | ICD-10-CM | POA: Diagnosis not present

## 2018-11-26 DIAGNOSIS — E6609 Other obesity due to excess calories: Secondary | ICD-10-CM | POA: Diagnosis not present

## 2018-11-26 DIAGNOSIS — R0902 Hypoxemia: Secondary | ICD-10-CM | POA: Diagnosis present

## 2018-11-26 DIAGNOSIS — M7138 Other bursal cyst, other site: Secondary | ICD-10-CM | POA: Diagnosis not present

## 2018-11-26 DIAGNOSIS — Z6834 Body mass index (BMI) 34.0-34.9, adult: Secondary | ICD-10-CM | POA: Diagnosis not present

## 2018-11-26 DIAGNOSIS — G4733 Obstructive sleep apnea (adult) (pediatric): Secondary | ICD-10-CM | POA: Diagnosis not present

## 2018-11-26 DIAGNOSIS — I129 Hypertensive chronic kidney disease with stage 1 through stage 4 chronic kidney disease, or unspecified chronic kidney disease: Secondary | ICD-10-CM | POA: Diagnosis not present

## 2018-11-26 DIAGNOSIS — I517 Cardiomegaly: Secondary | ICD-10-CM | POA: Diagnosis not present

## 2018-11-26 DIAGNOSIS — I10 Essential (primary) hypertension: Secondary | ICD-10-CM | POA: Diagnosis not present

## 2018-11-26 DIAGNOSIS — M5489 Other dorsalgia: Secondary | ICD-10-CM | POA: Diagnosis not present

## 2018-11-26 DIAGNOSIS — M48062 Spinal stenosis, lumbar region with neurogenic claudication: Secondary | ICD-10-CM | POA: Diagnosis not present

## 2018-11-26 DIAGNOSIS — I519 Heart disease, unspecified: Secondary | ICD-10-CM | POA: Diagnosis not present

## 2018-11-26 DIAGNOSIS — N179 Acute kidney failure, unspecified: Secondary | ICD-10-CM | POA: Diagnosis not present

## 2018-11-26 DIAGNOSIS — R0602 Shortness of breath: Secondary | ICD-10-CM | POA: Diagnosis not present

## 2018-11-26 DIAGNOSIS — Z743 Need for continuous supervision: Secondary | ICD-10-CM | POA: Diagnosis not present

## 2018-11-26 DIAGNOSIS — Z993 Dependence on wheelchair: Secondary | ICD-10-CM | POA: Diagnosis not present

## 2018-11-26 DIAGNOSIS — E1165 Type 2 diabetes mellitus with hyperglycemia: Secondary | ICD-10-CM | POA: Diagnosis not present

## 2018-11-26 DIAGNOSIS — Z79899 Other long term (current) drug therapy: Secondary | ICD-10-CM | POA: Diagnosis not present

## 2018-11-26 DIAGNOSIS — Z6835 Body mass index (BMI) 35.0-35.9, adult: Secondary | ICD-10-CM | POA: Diagnosis not present

## 2018-11-26 DIAGNOSIS — M4800 Spinal stenosis, site unspecified: Secondary | ICD-10-CM | POA: Diagnosis not present

## 2018-11-26 DIAGNOSIS — Z4789 Encounter for other orthopedic aftercare: Secondary | ICD-10-CM | POA: Diagnosis not present

## 2018-11-26 DIAGNOSIS — D72829 Elevated white blood cell count, unspecified: Secondary | ICD-10-CM | POA: Diagnosis not present

## 2018-11-26 DIAGNOSIS — M1990 Unspecified osteoarthritis, unspecified site: Secondary | ICD-10-CM | POA: Diagnosis not present

## 2018-11-26 DIAGNOSIS — R52 Pain, unspecified: Secondary | ICD-10-CM | POA: Diagnosis not present

## 2018-11-26 DIAGNOSIS — Z9889 Other specified postprocedural states: Secondary | ICD-10-CM | POA: Diagnosis not present

## 2018-11-26 DIAGNOSIS — Z794 Long term (current) use of insulin: Secondary | ICD-10-CM | POA: Diagnosis not present

## 2018-11-26 DIAGNOSIS — K219 Gastro-esophageal reflux disease without esophagitis: Secondary | ICD-10-CM | POA: Diagnosis not present

## 2018-11-26 DIAGNOSIS — F4310 Post-traumatic stress disorder, unspecified: Secondary | ICD-10-CM | POA: Diagnosis present

## 2018-11-26 DIAGNOSIS — Z86711 Personal history of pulmonary embolism: Secondary | ICD-10-CM | POA: Diagnosis not present

## 2018-11-26 DIAGNOSIS — M1712 Unilateral primary osteoarthritis, left knee: Secondary | ICD-10-CM | POA: Diagnosis not present

## 2018-11-26 DIAGNOSIS — R279 Unspecified lack of coordination: Secondary | ICD-10-CM | POA: Diagnosis not present

## 2018-11-26 DIAGNOSIS — M48061 Spinal stenosis, lumbar region without neurogenic claudication: Secondary | ICD-10-CM | POA: Diagnosis not present

## 2018-11-26 DIAGNOSIS — S82101D Unspecified fracture of upper end of right tibia, subsequent encounter for closed fracture with routine healing: Secondary | ICD-10-CM | POA: Diagnosis not present

## 2018-11-26 DIAGNOSIS — I959 Hypotension, unspecified: Secondary | ICD-10-CM | POA: Diagnosis not present

## 2018-11-26 NOTE — Telephone Encounter (Signed)
Daughter in law LMOVM stating that they had LM for Dr. Birdie Riddle needing her assist w/pt, however pt has been transported to [facility] and that they did not need her assists now. If you needed to contact [place] you could.

## 2018-12-05 DIAGNOSIS — E119 Type 2 diabetes mellitus without complications: Secondary | ICD-10-CM | POA: Diagnosis not present

## 2018-12-05 DIAGNOSIS — M1990 Unspecified osteoarthritis, unspecified site: Secondary | ICD-10-CM | POA: Diagnosis not present

## 2018-12-05 DIAGNOSIS — N179 Acute kidney failure, unspecified: Secondary | ICD-10-CM | POA: Diagnosis not present

## 2018-12-05 DIAGNOSIS — I9589 Other hypotension: Secondary | ICD-10-CM | POA: Diagnosis not present

## 2018-12-05 DIAGNOSIS — E861 Hypovolemia: Secondary | ICD-10-CM | POA: Diagnosis not present

## 2018-12-05 DIAGNOSIS — D72829 Elevated white blood cell count, unspecified: Secondary | ICD-10-CM | POA: Diagnosis not present

## 2018-12-05 DIAGNOSIS — E669 Obesity, unspecified: Secondary | ICD-10-CM | POA: Diagnosis not present

## 2018-12-05 DIAGNOSIS — I10 Essential (primary) hypertension: Secondary | ICD-10-CM | POA: Diagnosis not present

## 2018-12-05 DIAGNOSIS — Z794 Long term (current) use of insulin: Secondary | ICD-10-CM | POA: Diagnosis not present

## 2018-12-05 DIAGNOSIS — Z4789 Encounter for other orthopedic aftercare: Secondary | ICD-10-CM | POA: Diagnosis not present

## 2018-12-05 DIAGNOSIS — M48062 Spinal stenosis, lumbar region with neurogenic claudication: Secondary | ICD-10-CM | POA: Diagnosis not present

## 2018-12-05 DIAGNOSIS — M4800 Spinal stenosis, site unspecified: Secondary | ICD-10-CM | POA: Diagnosis not present

## 2018-12-05 DIAGNOSIS — E559 Vitamin D deficiency, unspecified: Secondary | ICD-10-CM | POA: Diagnosis not present

## 2018-12-05 DIAGNOSIS — E1165 Type 2 diabetes mellitus with hyperglycemia: Secondary | ICD-10-CM | POA: Diagnosis not present

## 2018-12-06 DIAGNOSIS — E119 Type 2 diabetes mellitus without complications: Secondary | ICD-10-CM | POA: Diagnosis not present

## 2018-12-06 DIAGNOSIS — Z794 Long term (current) use of insulin: Secondary | ICD-10-CM | POA: Diagnosis not present

## 2018-12-06 DIAGNOSIS — I10 Essential (primary) hypertension: Secondary | ICD-10-CM | POA: Diagnosis not present

## 2018-12-06 DIAGNOSIS — M48062 Spinal stenosis, lumbar region with neurogenic claudication: Secondary | ICD-10-CM | POA: Diagnosis not present

## 2018-12-06 MED ORDER — GENERIC EXTERNAL MEDICATION
Status: DC
Start: ? — End: 2018-12-06

## 2018-12-06 MED ORDER — HYDROMORPHONE HCL 2 MG PO TABS
2.00 | ORAL_TABLET | ORAL | Status: DC
Start: ? — End: 2018-12-06

## 2018-12-06 MED ORDER — NATALCARE GLOSSTABS PO
4.00 | ORAL | Status: DC
Start: ? — End: 2018-12-06

## 2018-12-06 MED ORDER — TRAZODONE HCL 50 MG PO TABS
50.00 | ORAL_TABLET | ORAL | Status: DC
Start: 2018-12-05 — End: 2018-12-06

## 2018-12-06 MED ORDER — DOCUSATE SODIUM 100 MG PO CAPS
100.00 | ORAL_CAPSULE | ORAL | Status: DC
Start: 2018-12-05 — End: 2018-12-06

## 2018-12-06 MED ORDER — BISACODYL 10 MG RE SUPP
10.00 | RECTAL | Status: DC
Start: 2018-12-06 — End: 2018-12-06

## 2018-12-06 MED ORDER — ATORVASTATIN CALCIUM 20 MG PO TABS
20.00 | ORAL_TABLET | ORAL | Status: DC
Start: 2018-12-05 — End: 2018-12-06

## 2018-12-06 MED ORDER — ALUM & MAG HYDROXIDE-SIMETH 200-200-20 MG/5ML PO SUSP
30.00 | ORAL | Status: DC
Start: ? — End: 2018-12-06

## 2018-12-06 MED ORDER — ISOSORBIDE MONONITRATE ER 30 MG PO TB24
30.00 | ORAL_TABLET | ORAL | Status: DC
Start: 2018-12-06 — End: 2018-12-06

## 2018-12-06 MED ORDER — PANTOPRAZOLE SODIUM 40 MG PO TBEC
40.00 | DELAYED_RELEASE_TABLET | ORAL | Status: DC
Start: 2018-12-05 — End: 2018-12-06

## 2018-12-06 MED ORDER — INSULIN LISPRO 100 UNIT/ML ~~LOC~~ SOLN
3.00 | SUBCUTANEOUS | Status: DC
Start: 2018-12-05 — End: 2018-12-06

## 2018-12-06 MED ORDER — CARVEDILOL 3.125 MG PO TABS
3.13 | ORAL_TABLET | ORAL | Status: DC
Start: 2018-12-05 — End: 2018-12-06

## 2018-12-06 MED ORDER — MAGNESIUM OXIDE 400 MG PO TABS
400.00 | ORAL_TABLET | ORAL | Status: DC
Start: 2018-12-05 — End: 2018-12-06

## 2018-12-06 MED ORDER — ONDANSETRON HCL 4 MG/2ML IJ SOLN
4.00 | INTRAMUSCULAR | Status: DC
Start: ? — End: 2018-12-06

## 2018-12-06 MED ORDER — QUETIAPINE FUMARATE 25 MG PO TABS
50.00 | ORAL_TABLET | ORAL | Status: DC
Start: 2018-12-05 — End: 2018-12-06

## 2018-12-06 MED ORDER — MORPHINE SULFATE (PF) 10 MG/ML IV SOLN
2.00 | INTRAVENOUS | Status: DC
Start: ? — End: 2018-12-06

## 2018-12-06 MED ORDER — PANTOPRAZOLE SODIUM 40 MG PO TBEC
40.00 | DELAYED_RELEASE_TABLET | ORAL | Status: DC
Start: 2018-12-06 — End: 2018-12-06

## 2018-12-06 MED ORDER — SODIUM CHLORIDE 0.9 % IV SOLN
10.00 | INTRAVENOUS | Status: DC
Start: ? — End: 2018-12-06

## 2018-12-06 MED ORDER — GALANTAMINE HYDROBROMIDE 8 MG PO TABS
8.00 | ORAL_TABLET | ORAL | Status: DC
Start: 2018-12-05 — End: 2018-12-06

## 2018-12-06 MED ORDER — INSULIN GLARGINE 100 UNIT/ML ~~LOC~~ SOLN
18.00 | SUBCUTANEOUS | Status: DC
Start: 2018-12-05 — End: 2018-12-06

## 2018-12-06 MED ORDER — IBUPROFEN 200 MG PO TABS
400.00 | ORAL_TABLET | ORAL | Status: DC
Start: ? — End: 2018-12-06

## 2018-12-06 MED ORDER — HYDROCODONE-ACETAMINOPHEN 5-325 MG PO TABS
1.00 | ORAL_TABLET | ORAL | Status: DC
Start: ? — End: 2018-12-06

## 2018-12-06 MED ORDER — POLYETHYLENE GLYCOL 3350 17 G PO PACK
17.00 | PACK | ORAL | Status: DC
Start: 2018-12-05 — End: 2018-12-06

## 2018-12-06 MED ORDER — HYDROXYZINE HCL 25 MG PO TABS
25.00 | ORAL_TABLET | ORAL | Status: DC
Start: ? — End: 2018-12-06

## 2018-12-06 MED ORDER — PSYLLIUM 58.12 % PO PACK
2.00 | PACK | ORAL | Status: DC
Start: 2018-12-05 — End: 2018-12-06

## 2018-12-06 MED ORDER — ACETAMINOPHEN 325 MG PO TABS
650.00 | ORAL_TABLET | ORAL | Status: DC
Start: ? — End: 2018-12-06

## 2018-12-10 DIAGNOSIS — N179 Acute kidney failure, unspecified: Secondary | ICD-10-CM | POA: Diagnosis not present

## 2018-12-10 DIAGNOSIS — E861 Hypovolemia: Secondary | ICD-10-CM | POA: Diagnosis not present

## 2018-12-10 DIAGNOSIS — I9589 Other hypotension: Secondary | ICD-10-CM | POA: Diagnosis not present

## 2018-12-10 DIAGNOSIS — M48062 Spinal stenosis, lumbar region with neurogenic claudication: Secondary | ICD-10-CM | POA: Diagnosis not present

## 2018-12-14 DIAGNOSIS — E669 Obesity, unspecified: Secondary | ICD-10-CM | POA: Diagnosis not present

## 2018-12-14 DIAGNOSIS — M48062 Spinal stenosis, lumbar region with neurogenic claudication: Secondary | ICD-10-CM | POA: Diagnosis not present

## 2018-12-14 DIAGNOSIS — E119 Type 2 diabetes mellitus without complications: Secondary | ICD-10-CM | POA: Diagnosis not present

## 2018-12-14 DIAGNOSIS — Z794 Long term (current) use of insulin: Secondary | ICD-10-CM | POA: Diagnosis not present

## 2018-12-21 DIAGNOSIS — M48062 Spinal stenosis, lumbar region with neurogenic claudication: Secondary | ICD-10-CM | POA: Diagnosis not present

## 2018-12-21 DIAGNOSIS — E119 Type 2 diabetes mellitus without complications: Secondary | ICD-10-CM | POA: Diagnosis not present

## 2018-12-21 DIAGNOSIS — D72829 Elevated white blood cell count, unspecified: Secondary | ICD-10-CM | POA: Diagnosis not present

## 2018-12-21 DIAGNOSIS — I10 Essential (primary) hypertension: Secondary | ICD-10-CM | POA: Diagnosis not present

## 2018-12-28 ENCOUNTER — Telehealth: Payer: Self-pay | Admitting: Family Medicine

## 2018-12-28 DIAGNOSIS — E1165 Type 2 diabetes mellitus with hyperglycemia: Secondary | ICD-10-CM | POA: Diagnosis not present

## 2018-12-28 DIAGNOSIS — M48062 Spinal stenosis, lumbar region with neurogenic claudication: Secondary | ICD-10-CM | POA: Diagnosis not present

## 2018-12-28 DIAGNOSIS — I10 Essential (primary) hypertension: Secondary | ICD-10-CM | POA: Diagnosis not present

## 2018-12-28 DIAGNOSIS — E119 Type 2 diabetes mellitus without complications: Secondary | ICD-10-CM | POA: Diagnosis not present

## 2018-12-28 NOTE — Telephone Encounter (Signed)
Attempted to reach patient to complete TCM and schedule hospital/rehab f/u.

## 2018-12-28 NOTE — Telephone Encounter (Signed)
Copied from [place] [id number]. Topic: General - Other >> Dec 28, 2018  9:39 AM Richardo Priest, NT wrote: Reason for CRM: Ms.Nathan is calling from [facility] Rehab center. States patient has been there since April 8th and will be discharging 5/1 with wife at home. Ms.Nathan wants to make Dr.Tabori aware she is sending patient home with home health orders and that rehab may be calling for orders. Ms.Nathan also wants to make practice aware she will be faxing information about his rehab stay to the office. Ms.Nathan's direct line is [phone] if any information is needed.

## 2018-12-28 NOTE — Telephone Encounter (Signed)
Will forward to RN for TCM call

## 2018-12-29 DIAGNOSIS — Z4789 Encounter for other orthopedic aftercare: Secondary | ICD-10-CM | POA: Diagnosis not present

## 2018-12-29 DIAGNOSIS — I10 Essential (primary) hypertension: Secondary | ICD-10-CM | POA: Diagnosis not present

## 2018-12-29 DIAGNOSIS — S82891D Other fracture of right lower leg, subsequent encounter for closed fracture with routine healing: Secondary | ICD-10-CM | POA: Diagnosis not present

## 2018-12-29 DIAGNOSIS — Z9181 History of falling: Secondary | ICD-10-CM | POA: Diagnosis not present

## 2018-12-29 DIAGNOSIS — E559 Vitamin D deficiency, unspecified: Secondary | ICD-10-CM | POA: Diagnosis not present

## 2018-12-29 DIAGNOSIS — Z794 Long term (current) use of insulin: Secondary | ICD-10-CM | POA: Diagnosis not present

## 2018-12-29 DIAGNOSIS — E1140 Type 2 diabetes mellitus with diabetic neuropathy, unspecified: Secondary | ICD-10-CM | POA: Diagnosis not present

## 2018-12-29 DIAGNOSIS — I959 Hypotension, unspecified: Secondary | ICD-10-CM | POA: Diagnosis not present

## 2018-12-29 DIAGNOSIS — M1990 Unspecified osteoarthritis, unspecified site: Secondary | ICD-10-CM | POA: Diagnosis not present

## 2018-12-29 DIAGNOSIS — M48061 Spinal stenosis, lumbar region without neurogenic claudication: Secondary | ICD-10-CM | POA: Diagnosis not present

## 2018-12-31 ENCOUNTER — Telehealth: Payer: Self-pay | Admitting: Family Medicine

## 2018-12-31 NOTE — Telephone Encounter (Signed)
Copied from [place] [phone]. Topic: Quick Communication - Home Health Verbal Orders >> Dec 31, 2018 10:23 AM Carolyn Stare wrote: Caller/Agency Christine with [facility] Number    [phone]   [id number] verbal  PT   Frequency 1 x 8

## 2018-12-31 NOTE — Telephone Encounter (Signed)
Transition Care Management Follow-up Telephone Call  Admission dates for [facility]: 12/05/2018-12/28/2018 Admission dates for [place]: 11/26/2018-12/05/2018, lumbar laminectomy    How have you been since you were released from the hospital? "I still can't walk good" Pt states HH RN and PT to visit home to improve ambulation.    Do you understand why you were in the hospital? yes   Do you understand the discharge instructions? yes   Where were you discharged to? Home.    Items Reviewed:  Medications reviewed: no, pt did not have list/meds at time of call. Pt states "they are the same"  Allergies reviewed: yes  Dietary changes reviewed: yes  Referrals reviewed: yes   Functional Questionnaire:   Activities of Daily Living (ADLs):   He states they are independent in the following: ambulation, bathing and hygiene, feeding, continence, grooming, toileting and dressing. Pt reports he is able to complete ADLs, however very slow in completing.  States they require assistance with the following: None.    Any transportation issues/concerns?: no   Any patient concerns? no   Confirmed importance and date/time of follow-up visits scheduled yes  Provider Appointment booked with PCP 01/02/2019 via phone call, no video capability.   Confirmed with patient if condition begins to worsen call PCP or go to the ER.  Patient was given the office number and encouraged to call back with question or concerns.  : yes

## 2018-12-31 NOTE — Telephone Encounter (Signed)
Attempted to call number back to give okay.  Voicemail was not private and no name listed so I did not leave a detailed message.   Asked that Altha Harm give [place] a call back.

## 2018-12-31 NOTE — Telephone Encounter (Signed)
Ok for verbal orders ?

## 2019-01-01 DIAGNOSIS — S82891D Other fracture of right lower leg, subsequent encounter for closed fracture with routine healing: Secondary | ICD-10-CM | POA: Diagnosis not present

## 2019-01-01 DIAGNOSIS — M1990 Unspecified osteoarthritis, unspecified site: Secondary | ICD-10-CM | POA: Diagnosis not present

## 2019-01-01 DIAGNOSIS — Z4789 Encounter for other orthopedic aftercare: Secondary | ICD-10-CM | POA: Diagnosis not present

## 2019-01-01 DIAGNOSIS — I10 Essential (primary) hypertension: Secondary | ICD-10-CM | POA: Diagnosis not present

## 2019-01-01 DIAGNOSIS — M48061 Spinal stenosis, lumbar region without neurogenic claudication: Secondary | ICD-10-CM | POA: Diagnosis not present

## 2019-01-01 DIAGNOSIS — E1140 Type 2 diabetes mellitus with diabetic neuropathy, unspecified: Secondary | ICD-10-CM | POA: Diagnosis not present

## 2019-01-01 NOTE — Telephone Encounter (Signed)
Spoke with [facility] health PT and gave the verbal orders today when he called.

## 2019-01-01 NOTE — Telephone Encounter (Addendum)
Attempted to call again.  No answer and no name/private VM. Left message for them to call back

## 2019-01-02 ENCOUNTER — Other Ambulatory Visit: Payer: Self-pay

## 2019-01-02 ENCOUNTER — Encounter: Payer: Self-pay | Admitting: Family Medicine

## 2019-01-02 ENCOUNTER — Ambulatory Visit (INDEPENDENT_AMBULATORY_CARE_PROVIDER_SITE_OTHER): Payer: Medicare Other | Admitting: Family Medicine

## 2019-01-02 VITALS — BP 126/70 | Temp 98.0°F

## 2019-01-02 DIAGNOSIS — E1121 Type 2 diabetes mellitus with diabetic nephropathy: Secondary | ICD-10-CM

## 2019-01-02 DIAGNOSIS — Z9889 Other specified postprocedural states: Secondary | ICD-10-CM

## 2019-01-02 NOTE — Progress Notes (Signed)
I have discussed the procedure for the virtual visit with the patient who has given consent to proceed with assessment and treatment.   Jolane Bankhead L Mckinze Poirier, CMA     

## 2019-01-02 NOTE — Progress Notes (Signed)
Virtual Visit via Telephone Note  I connected with [name] on 01/02/19 at  1:20 PM EDT by telephone and verified that I am speaking with the correct person using two identifiers.  Location: Patient: Home Provider: Office   I discussed the limitations, risks, security and privacy concerns of performing an evaluation and management service by telephone and the availability of in person appointments. I also discussed with the patient that there may be a patient responsible charge related to this service. The patient expressed understanding and agreed to proceed.   History of Present Illness: Rehab F/U- pt had bilateral lumbar laminectomy of L2-3 and L3-4 on 4/1.  Was then transferred to rehab facility from 4/8-5/1.  On 5/1 was d/c'd home w/ home [facility] RN and PT.  Pt reports rehab was a 'terrible' experience.  Difficulty getting diabetes medication, feels staff stole his teeth, had issues getting to the bathroom.  Is getting weekly PT.  Hopes to soon get into [place] for onsite Rehab.  Pt is trying to get out of the wheelchair and ambulate w/ a walker.  Working on getting strength and mobility of R ankle.  Pt is not able to sleep in his bed- fell out of his current bed- so has been sleeping in his chair.  Waiting for them to deliver a hospital bed and bedside commode. [facility].  Pt reports home CBGs are 110s.  Pt refusing to review medications- 'they haven't changed'  Pt has eye exam scheduled on 5/18.   Observations/Objective: Pt is able to speak clearly, coherently without shortness of breath or increased work of breathing.  Thought process is linear.  Mood is appropriate.   Assessment and Plan: S/P lumbar laminectomy- new.  Pt spent 3 weeks at rehab and is now home.  He has [place] PT and is hoping to start onsite PT.  He is waiting on hospital bed and bedside commode.  Overall, he feels that things are going well and is happy to be home.  DM- pt reports this  was poorly controlled while he was in rehab due to some medication issues.  He feels that things are 'back on track' now that he is home and taking medication on his schedule.  He has not had recent labwork done by [place] and would like to do so.  Pt will be scheduled to return for labs and adjustments will be made.  Follow Up Instructions:    I discussed the assessment and treatment plan with the patient. The patient was provided an opportunity to ask questions and all were answered. The patient agreed with the plan and demonstrated an understanding of the instructions.   The patient was advised to call back or seek an in-person evaluation if the symptoms worsen or if the condition fails to improve as anticipated.  I provided 17 minutes of non-face-to-face time during this encounter.   Annye Asa, MD

## 2019-01-03 ENCOUNTER — Telehealth: Payer: Self-pay | Admitting: Family Medicine

## 2019-01-03 NOTE — Telephone Encounter (Signed)
[place] for verbal ok?   Also spoke with both pt and his wife and they are unaware of any upcoming surgery

## 2019-01-03 NOTE — Telephone Encounter (Signed)
Copied from [place] [id number]. Topic: Quick Communication - Home Health Verbal Orders >> Jan 03, 2019  9:52 AM Leward Quan A wrote: Caller/Agency: Pamala Hurry / [place] Number: [phone] ok to leave message  Requesting OT/PT/Skilled Nursing/Social Work/Speech Therapy: PT Frequency: 1 time week for 9 weeks  Also need to know the date of surgery on right ankle

## 2019-01-03 NOTE — Telephone Encounter (Signed)
Ok for orders? 

## 2019-01-03 NOTE — Telephone Encounter (Signed)
Called and verbal ok given.  

## 2019-01-10 DIAGNOSIS — Z4789 Encounter for other orthopedic aftercare: Secondary | ICD-10-CM | POA: Diagnosis not present

## 2019-01-10 DIAGNOSIS — S82891D Other fracture of right lower leg, subsequent encounter for closed fracture with routine healing: Secondary | ICD-10-CM | POA: Diagnosis not present

## 2019-01-10 DIAGNOSIS — I10 Essential (primary) hypertension: Secondary | ICD-10-CM | POA: Diagnosis not present

## 2019-01-10 DIAGNOSIS — E1140 Type 2 diabetes mellitus with diabetic neuropathy, unspecified: Secondary | ICD-10-CM | POA: Diagnosis not present

## 2019-01-10 DIAGNOSIS — M48061 Spinal stenosis, lumbar region without neurogenic claudication: Secondary | ICD-10-CM | POA: Diagnosis not present

## 2019-01-10 DIAGNOSIS — M1990 Unspecified osteoarthritis, unspecified site: Secondary | ICD-10-CM | POA: Diagnosis not present

## 2019-01-14 DIAGNOSIS — S82891D Other fracture of right lower leg, subsequent encounter for closed fracture with routine healing: Secondary | ICD-10-CM | POA: Diagnosis not present

## 2019-01-14 DIAGNOSIS — I10 Essential (primary) hypertension: Secondary | ICD-10-CM | POA: Diagnosis not present

## 2019-01-14 DIAGNOSIS — Z4789 Encounter for other orthopedic aftercare: Secondary | ICD-10-CM | POA: Diagnosis not present

## 2019-01-14 DIAGNOSIS — M48061 Spinal stenosis, lumbar region without neurogenic claudication: Secondary | ICD-10-CM | POA: Diagnosis not present

## 2019-01-14 DIAGNOSIS — M1990 Unspecified osteoarthritis, unspecified site: Secondary | ICD-10-CM | POA: Diagnosis not present

## 2019-01-14 DIAGNOSIS — E1140 Type 2 diabetes mellitus with diabetic neuropathy, unspecified: Secondary | ICD-10-CM | POA: Diagnosis not present

## 2019-01-24 DIAGNOSIS — E559 Vitamin D deficiency, unspecified: Secondary | ICD-10-CM | POA: Diagnosis not present

## 2019-01-24 DIAGNOSIS — M1990 Unspecified osteoarthritis, unspecified site: Secondary | ICD-10-CM | POA: Diagnosis not present

## 2019-01-24 DIAGNOSIS — S82891D Other fracture of right lower leg, subsequent encounter for closed fracture with routine healing: Secondary | ICD-10-CM | POA: Diagnosis not present

## 2019-01-24 DIAGNOSIS — I10 Essential (primary) hypertension: Secondary | ICD-10-CM | POA: Diagnosis not present

## 2019-01-24 DIAGNOSIS — Z794 Long term (current) use of insulin: Secondary | ICD-10-CM | POA: Diagnosis not present

## 2019-01-24 DIAGNOSIS — Z9181 History of falling: Secondary | ICD-10-CM | POA: Diagnosis not present

## 2019-01-24 DIAGNOSIS — M48061 Spinal stenosis, lumbar region without neurogenic claudication: Secondary | ICD-10-CM | POA: Diagnosis not present

## 2019-01-24 DIAGNOSIS — I959 Hypotension, unspecified: Secondary | ICD-10-CM | POA: Diagnosis not present

## 2019-01-24 DIAGNOSIS — Z4789 Encounter for other orthopedic aftercare: Secondary | ICD-10-CM | POA: Diagnosis not present

## 2019-01-24 DIAGNOSIS — E1140 Type 2 diabetes mellitus with diabetic neuropathy, unspecified: Secondary | ICD-10-CM | POA: Diagnosis not present

## 2019-02-11 LAB — HM DIABETES EYE EXAM

## 2019-03-13 ENCOUNTER — Ambulatory Visit (INDEPENDENT_AMBULATORY_CARE_PROVIDER_SITE_OTHER): Payer: Medicare Other | Admitting: Family Medicine

## 2019-03-13 ENCOUNTER — Encounter: Payer: Self-pay | Admitting: Family Medicine

## 2019-03-13 ENCOUNTER — Other Ambulatory Visit: Payer: Self-pay

## 2019-03-13 DIAGNOSIS — R296 Repeated falls: Secondary | ICD-10-CM

## 2019-03-13 DIAGNOSIS — E1121 Type 2 diabetes mellitus with diabetic nephropathy: Secondary | ICD-10-CM

## 2019-03-13 MED ORDER — ACCU-CHEK AVIVA PLUS VI STRP: ORAL_STRIP | 3 refills | Status: DC

## 2019-03-13 NOTE — Progress Notes (Signed)
I have discussed the procedure for the virtual visit with the patient who has given consent to proceed with assessment and treatment.   Unable to obtain vitals  Davis Gourd, CMA

## 2019-03-13 NOTE — Progress Notes (Signed)
Virtual Visit via Video   I connected with patient on 03/13/19 at  2:30 PM EDT by a video enabled telemedicine application and verified that I am speaking with the correct person using two identifiers.  Location patient: Home Location provider: Acupuncturist, Office Persons participating in the virtual visit: Patient, Provider, [place] (Jess B)  I discussed the limitations of evaluation and management by telemedicine and the availability of in person appointments. The patient expressed understanding and agreed to proceed.  Interactive audio and video telecommunications were attempted between this provider and patient, however failed, due to patient having technical difficulties OR patient did not have access to video capability.  We continued and completed visit with audio only.   Subjective:   HPI:   Falls- pt [facility]  He says in the AM his legs feel strong but as the day goes on 'they give out on me'.  Pt is not able to get in and out of his car w/o falling.  S/p spinal surgery.  Back pain has dramatically improved.  Pt reports it's his R leg only.  Pt has not had any injuries from his recent falls.  Has a neurosurgeon and PT at [place].  ROS:   See pertinent positives and negatives per HPI.  Patient Active Problem List   Diagnosis Date Noted  . Dyspnea 09/16/2017  . Wheelchair confinement status 07/18/2017  . Bilateral lower extremity edema 07/18/2017  . Cholecystitis 01/16/2017  . Depression 08/15/2016  . GERD (gastroesophageal reflux disease) 08/15/2016  . HLD (hyperlipidemia) 08/15/2016  . Abdominal pain 08/15/2016  . Elevated liver function tests   . Closed right fibular fracture 07/27/2016  . Emphysematous cholecystitis s/p perc cholecystostomy drain 07/21/2016 07/20/2016  . Pressure injury of skin 07/20/2016  . Hx of pulmonary embolus 07/20/2016  . Hyponatremia 07/20/2016  . Urinary retention   . Type 2 diabetes with  nephropathy ([place])   . Chronic post-traumatic stress disorder (PTSD) 11/30/2015  . Chronic pain syndrome 11/30/2015  . Morbid obesity due to excess calories ([place]) 11/30/2015  . Nocturia more than twice per night 11/30/2015  . Closed displaced bimalleolar fracture of right ankle 02/20/2015  . Duodenal ulcer, perforated s/p repair/omental patch 11/26/2012  . Helicobacter pylori gastritis 11/26/2012  . Hypotension 11/16/2012  . Leukocytosis 11/12/2012  . Lumbosacral radiculopathy at L5 11/12/2012  . Herniated lumbar intervertebral disc 11/12/2012  . Essential hypertension, benign 11/12/2012  . Unspecified constipation 11/12/2012  . History of back surgery 11/09/2012  . Obesity 11/09/2012    Social History   Tobacco Use  . Smoking status: Never Smoker  . Smokeless tobacco: Current User    Types: Chew  Substance Use Topics  . Alcohol use: Not Currently    Current Outpatient Medications:  .  Blood Glucose Monitoring Suppl (ACCU-CHEK AVIVA PLUS) w/Device KIT, Use glucometer to test sugars 4 times daily. Dx. E11.9, Disp: 1 kit, Rfl: 0 .  calcium-vitamin D (OSCAL WITH D) 500-200 MG-UNIT tablet, Take 1 tablet by mouth., Disp: , Rfl:  .  carvedilol (COREG) 3.125 MG tablet, TAKE 1 TABLET TWICE A DAY WITH MEALS (Patient taking differently: Take 3.125 mg by mouth 2 (two) times daily. ), Disp: 180 tablet, Rfl: 1 .  Cholecalciferol (VITAMIN D3) 2000 units capsule, Take 2,000 Units by mouth 2 (two) times daily. , Disp: , Rfl:  .  cyanocobalamin 500 MCG tablet, Take 500 mcg by mouth 2 (two) times daily., Disp: , Rfl:  .  docusate sodium (COLACE) 100 MG capsule, Take 1 capsule (100 mg total) by mouth 2 (two) times daily., Disp: 30 capsule, Rfl: 0 .  galantamine (RAZADYNE ER) 16 MG 24 hr capsule, Take 16 mg by mouth daily with breakfast., Disp: , Rfl:  .  glucose blood (ACCU-CHEK AVIVA PLUS) test strip, Use one strip to test sugars. Pt checks sugars 4 times daily. Dx. E11.9, Disp: 360 each, Rfl: 3 .   insulin glargine (LANTUS) 100 UNIT/ML injection, 10 units nightly, Disp: 10 mL, Rfl: 3 .  insulin lispro (HUMALOG) 100 UNIT/ML injection, Inject 0-5 Units into the skin 3 (three) times daily before meals. 0-200 = 0 units, 201-250 = 2 units, 251-300 = 3 units, 301-350 = 4 units, 351-400 = 5 units, >401 call MD/NP, Disp: , Rfl:  .  lactose free nutrition (BOOST) LIQD, Take 237 mLs by mouth daily as needed (nutrition)., Disp: , Rfl:  .  Lancets (FREESTYLE) lancets, Use as instructed to check sugars 4 times daily. Dx E11.9, Disp: 150 each, Rfl: 12 .  Magnesium Oxide 420 (252 Mg) MG TABS, Take 420 mg by mouth 2 (two) times daily. , Disp: , Rfl:  .  Melatonin 5 MG TABS, Take 5 mg by mouth at bedtime., Disp: , Rfl:  .  methocarbamol (ROBAXIN) 750 MG tablet, Take 1 tablet (750 mg total) by mouth every 6 (six) hours as needed for muscle spasms., Disp: 20 tablet, Rfl: 0 .  Multiple Vitamins-Minerals (PRESERVISION AREDS 2 PO), Take 1 capsule by mouth 2 (two) times daily., Disp: , Rfl:  .  polyethylene glycol (MIRALAX / GLYCOLAX) packet, Take 17-25.5 g by mouth daily. , Disp: , Rfl:  .  PRAVASTATIN SODIUM PO, Take by mouth., Disp: , Rfl:  .  Propylene Glycol (SYSTANE BALANCE) 0.6 % SOLN, Place 1 drop into both eyes daily as needed (dry eyes)., Disp: , Rfl:  .  THERATEARS 0.25 % SOLN, , Disp: , Rfl:  .  torsemide (DEMADEX) 20 MG tablet, Take 1 tablet (20 mg total) by mouth 2 (two) times daily., Disp: 30 tablet, Rfl: 0 .  traZODone (DESYREL) 50 MG tablet, Take 50 mg by mouth at bedtime., Disp: , Rfl:   Allergies  Allergen Reactions  . Oxycontin [Oxycodone Hcl] Other (See Comments)    Pt states he cant sleep w/ this med; also makes him constipated.     Objective:   There were no vitals taken for this visit.  Pt is able to speak clearly, coherently without shortness of breath or increased work of breathing.  Thought process is linear.  Mood is appropriate.   Assessment and Plan:   Falls- pt is now  getting transport to and from the [place] due to multiple falls getting in and out of his vehicle since back surgery.  Denies injuries.  [facility] since [facility] already over there.  Told him he must talk to his surgeon regarding his recurrent falls.  Pt expressed understanding and is in agreement w/ plan.   Annye Asa, MD 03/13/2019  Time spent with the patient: 21 minutes, of which >50% was spent in obtaining information about symptoms, reviewing previous labs, evaluations, and treatments, counseling about condition (please see the discussed topics above), and developing a plan to further investigate it; had a number of questions which I addressed.

## 2019-03-26 LAB — CBC AND DIFFERENTIAL
HCT: 43 (ref 41–53)
Hemoglobin: 14.1 (ref 13.5–17.5)
Neutrophils Absolute: 8
Platelets: 255 (ref 150–399)
WBC: 14.9

## 2019-05-07 ENCOUNTER — Encounter (HOSPITAL_COMMUNITY): Payer: Self-pay

## 2019-05-07 ENCOUNTER — Other Ambulatory Visit: Payer: Self-pay

## 2019-05-07 ENCOUNTER — Inpatient Hospital Stay (HOSPITAL_COMMUNITY)
Admission: EM | Admit: 2019-05-07 | Discharge: 2019-05-10 | DRG: 690 | Disposition: A | Discharge status: Skilled Nursing Facility | Payer: Medicare Other | Attending: Internal Medicine | Admitting: Internal Medicine

## 2019-05-07 ENCOUNTER — Emergency Department (HOSPITAL_BASED_OUTPATIENT_CLINIC_OR_DEPARTMENT_OTHER): Payer: Medicare Other

## 2019-05-07 ENCOUNTER — Emergency Department (HOSPITAL_COMMUNITY): Payer: Medicare Other

## 2019-05-07 DIAGNOSIS — R52 Pain, unspecified: Secondary | ICD-10-CM | POA: Diagnosis not present

## 2019-05-07 DIAGNOSIS — E119 Type 2 diabetes mellitus without complications: Secondary | ICD-10-CM

## 2019-05-07 DIAGNOSIS — Z8701 Personal history of pneumonia (recurrent): Secondary | ICD-10-CM

## 2019-05-07 DIAGNOSIS — Z8711 Personal history of peptic ulcer disease: Secondary | ICD-10-CM

## 2019-05-07 DIAGNOSIS — Z993 Dependence on wheelchair: Secondary | ICD-10-CM

## 2019-05-07 DIAGNOSIS — E785 Hyperlipidemia, unspecified: Secondary | ICD-10-CM | POA: Diagnosis present

## 2019-05-07 DIAGNOSIS — T40605A Adverse effect of unspecified narcotics, initial encounter: Secondary | ICD-10-CM | POA: Diagnosis not present

## 2019-05-07 DIAGNOSIS — R296 Repeated falls: Secondary | ICD-10-CM | POA: Diagnosis present

## 2019-05-07 DIAGNOSIS — R29898 Other symptoms and signs involving the musculoskeletal system: Secondary | ICD-10-CM | POA: Diagnosis not present

## 2019-05-07 DIAGNOSIS — Z974 Presence of external hearing-aid: Secondary | ICD-10-CM

## 2019-05-07 DIAGNOSIS — R0902 Hypoxemia: Secondary | ICD-10-CM | POA: Diagnosis not present

## 2019-05-07 DIAGNOSIS — Z9049 Acquired absence of other specified parts of digestive tract: Secondary | ICD-10-CM

## 2019-05-07 DIAGNOSIS — M1990 Unspecified osteoarthritis, unspecified site: Secondary | ICD-10-CM | POA: Diagnosis present

## 2019-05-07 DIAGNOSIS — R079 Chest pain, unspecified: Secondary | ICD-10-CM | POA: Diagnosis not present

## 2019-05-07 DIAGNOSIS — Z885 Allergy status to narcotic agent status: Secondary | ICD-10-CM

## 2019-05-07 DIAGNOSIS — M5410 Radiculopathy, site unspecified: Secondary | ICD-10-CM | POA: Diagnosis present

## 2019-05-07 DIAGNOSIS — Z79890 Hormone replacement therapy (postmenopausal): Secondary | ICD-10-CM

## 2019-05-07 DIAGNOSIS — R531 Weakness: Secondary | ICD-10-CM

## 2019-05-07 DIAGNOSIS — G8929 Other chronic pain: Secondary | ICD-10-CM | POA: Diagnosis present

## 2019-05-07 DIAGNOSIS — F0390 Unspecified dementia without behavioral disturbance: Secondary | ICD-10-CM | POA: Diagnosis present

## 2019-05-07 DIAGNOSIS — R339 Retention of urine, unspecified: Secondary | ICD-10-CM | POA: Diagnosis not present

## 2019-05-07 DIAGNOSIS — D649 Anemia, unspecified: Secondary | ICD-10-CM | POA: Diagnosis present

## 2019-05-07 DIAGNOSIS — K5903 Drug induced constipation: Secondary | ICD-10-CM | POA: Diagnosis not present

## 2019-05-07 DIAGNOSIS — F329 Major depressive disorder, single episode, unspecified: Secondary | ICD-10-CM | POA: Diagnosis present

## 2019-05-07 DIAGNOSIS — Z79899 Other long term (current) drug therapy: Secondary | ICD-10-CM

## 2019-05-07 DIAGNOSIS — I129 Hypertensive chronic kidney disease with stage 1 through stage 4 chronic kidney disease, or unspecified chronic kidney disease: Secondary | ICD-10-CM | POA: Diagnosis not present

## 2019-05-07 DIAGNOSIS — F4310 Post-traumatic stress disorder, unspecified: Secondary | ICD-10-CM | POA: Diagnosis present

## 2019-05-07 DIAGNOSIS — M545 Low back pain: Secondary | ICD-10-CM | POA: Diagnosis present

## 2019-05-07 DIAGNOSIS — E1142 Type 2 diabetes mellitus with diabetic polyneuropathy: Secondary | ICD-10-CM | POA: Diagnosis present

## 2019-05-07 DIAGNOSIS — M2550 Pain in unspecified joint: Secondary | ICD-10-CM | POA: Diagnosis not present

## 2019-05-07 DIAGNOSIS — K5900 Constipation, unspecified: Secondary | ICD-10-CM | POA: Diagnosis not present

## 2019-05-07 DIAGNOSIS — Z8619 Personal history of other infectious and parasitic diseases: Secondary | ICD-10-CM

## 2019-05-07 DIAGNOSIS — Z720 Tobacco use: Secondary | ICD-10-CM | POA: Diagnosis not present

## 2019-05-07 DIAGNOSIS — N390 Urinary tract infection, site not specified: Secondary | ICD-10-CM | POA: Diagnosis not present

## 2019-05-07 DIAGNOSIS — I272 Pulmonary hypertension, unspecified: Secondary | ICD-10-CM | POA: Diagnosis present

## 2019-05-07 DIAGNOSIS — Z794 Long term (current) use of insulin: Secondary | ICD-10-CM

## 2019-05-07 DIAGNOSIS — K219 Gastro-esophageal reflux disease without esophagitis: Secondary | ICD-10-CM | POA: Diagnosis present

## 2019-05-07 DIAGNOSIS — R278 Other lack of coordination: Secondary | ICD-10-CM | POA: Diagnosis not present

## 2019-05-07 DIAGNOSIS — R609 Edema, unspecified: Secondary | ICD-10-CM | POA: Diagnosis not present

## 2019-05-07 DIAGNOSIS — I10 Essential (primary) hypertension: Secondary | ICD-10-CM

## 2019-05-07 DIAGNOSIS — N3000 Acute cystitis without hematuria: Secondary | ICD-10-CM | POA: Diagnosis not present

## 2019-05-07 DIAGNOSIS — Z7401 Bed confinement status: Secondary | ICD-10-CM | POA: Diagnosis not present

## 2019-05-07 DIAGNOSIS — Z20828 Contact with and (suspected) exposure to other viral communicable diseases: Secondary | ICD-10-CM | POA: Diagnosis present

## 2019-05-07 DIAGNOSIS — M6281 Muscle weakness (generalized): Secondary | ICD-10-CM | POA: Diagnosis not present

## 2019-05-07 DIAGNOSIS — Z6834 Body mass index (BMI) 34.0-34.9, adult: Secondary | ICD-10-CM

## 2019-05-07 DIAGNOSIS — E1121 Type 2 diabetes mellitus with diabetic nephropathy: Secondary | ICD-10-CM

## 2019-05-07 DIAGNOSIS — N183 Chronic kidney disease, stage 3 unspecified: Secondary | ICD-10-CM | POA: Diagnosis not present

## 2019-05-07 DIAGNOSIS — M7989 Other specified soft tissue disorders: Secondary | ICD-10-CM

## 2019-05-07 DIAGNOSIS — M549 Dorsalgia, unspecified: Secondary | ICD-10-CM | POA: Diagnosis not present

## 2019-05-07 DIAGNOSIS — R41841 Cognitive communication deficit: Secondary | ICD-10-CM | POA: Diagnosis not present

## 2019-05-07 DIAGNOSIS — Z86711 Personal history of pulmonary embolism: Secondary | ICD-10-CM

## 2019-05-07 DIAGNOSIS — R5381 Other malaise: Secondary | ICD-10-CM | POA: Diagnosis not present

## 2019-05-07 DIAGNOSIS — E1169 Type 2 diabetes mellitus with other specified complication: Secondary | ICD-10-CM | POA: Diagnosis not present

## 2019-05-07 DIAGNOSIS — I270 Primary pulmonary hypertension: Secondary | ICD-10-CM | POA: Diagnosis not present

## 2019-05-07 DIAGNOSIS — G894 Chronic pain syndrome: Secondary | ICD-10-CM | POA: Diagnosis not present

## 2019-05-07 DIAGNOSIS — I959 Hypotension, unspecified: Secondary | ICD-10-CM | POA: Diagnosis not present

## 2019-05-07 DIAGNOSIS — Z972 Presence of dental prosthetic device (complete) (partial): Secondary | ICD-10-CM

## 2019-05-07 DIAGNOSIS — E871 Hypo-osmolality and hyponatremia: Secondary | ICD-10-CM | POA: Diagnosis not present

## 2019-05-07 DIAGNOSIS — G4733 Obstructive sleep apnea (adult) (pediatric): Secondary | ICD-10-CM | POA: Diagnosis present

## 2019-05-07 DIAGNOSIS — Z9181 History of falling: Secondary | ICD-10-CM | POA: Diagnosis not present

## 2019-05-07 DIAGNOSIS — R600 Localized edema: Secondary | ICD-10-CM | POA: Diagnosis not present

## 2019-05-07 DIAGNOSIS — E669 Obesity, unspecified: Secondary | ICD-10-CM | POA: Diagnosis not present

## 2019-05-07 DIAGNOSIS — F4312 Post-traumatic stress disorder, chronic: Secondary | ICD-10-CM | POA: Diagnosis not present

## 2019-05-07 LAB — URINALYSIS, ROUTINE W REFLEX MICROSCOPIC
Bilirubin Urine: NEGATIVE
Glucose, UA: NEGATIVE mg/dL
Hgb urine dipstick: NEGATIVE
Ketones, ur: 5 mg/dL — AB
Nitrite: NEGATIVE
Protein, ur: NEGATIVE mg/dL
Specific Gravity, Urine: 1.024 (ref 1.005–1.030)
WBC, UA: >50 WBC/hpf — ABNORMAL HIGH (ref 0–5)
pH: 5 (ref 5.0–8.0)

## 2019-05-07 LAB — CBC WITH DIFFERENTIAL/PLATELET
Abs Immature Granulocytes: 0.16 10*3/uL — ABNORMAL HIGH (ref 0.00–0.07)
Basophils Absolute: 0 10*3/uL (ref 0.0–0.1)
Basophils Relative: 0 %
Eosinophils Absolute: 0.1 10*3/uL (ref 0.0–0.5)
Eosinophils Relative: 1 %
HCT: 40.8 % (ref 39.0–52.0)
Hemoglobin: 12.6 g/dL — ABNORMAL LOW (ref 13.0–17.0)
Immature Granulocytes: 1 %
Lymphocytes Relative: 10 %
Lymphs Abs: 2.4 10*3/uL (ref 0.7–4.0)
MCH: 28.9 pg (ref 26.0–34.0)
MCHC: 30.9 g/dL (ref 30.0–36.0)
MCV: 93.6 fL (ref 80.0–100.0)
Monocytes Absolute: 2.4 10*3/uL — ABNORMAL HIGH (ref 0.1–1.0)
Monocytes Relative: 10 %
Neutro Abs: 19.6 10*3/uL — ABNORMAL HIGH (ref 1.7–7.7)
Neutrophils Relative %: 78 %
Platelets: 226 10*3/uL (ref 150–400)
RBC: 4.36 MIL/uL (ref 4.22–5.81)
RDW: 14.3 % (ref 11.5–15.5)
WBC: 24.7 10*3/uL — ABNORMAL HIGH (ref 4.0–10.5)
nRBC: 0 % (ref 0.0–0.2)

## 2019-05-07 LAB — COMPREHENSIVE METABOLIC PANEL
ALT: 14 U/L (ref 0–44)
AST: 17 U/L (ref 15–41)
Albumin: 3.3 g/dL — ABNORMAL LOW (ref 3.5–5.0)
Alkaline Phosphatase: 77 U/L (ref 38–126)
Anion gap: 9 (ref 5–15)
BUN: 27 mg/dL — ABNORMAL HIGH (ref 8–23)
CO2: 28 mmol/L (ref 22–32)
Calcium: 9.1 mg/dL (ref 8.9–10.3)
Chloride: 105 mmol/L (ref 98–111)
Creatinine, Ser: 1.17 mg/dL (ref 0.61–1.24)
GFR calc Af Amer: >60 mL/min (ref >60)
GFR calc non Af Amer: 57 mL/min — ABNORMAL LOW (ref >60)
Glucose, Bld: 99 mg/dL (ref 70–99)
Potassium: 4.3 mmol/L (ref 3.5–5.1)
Sodium: 142 mmol/L (ref 135–145)
Total Bilirubin: 0.6 mg/dL (ref 0.3–1.2)
Total Protein: 6.7 g/dL (ref 6.5–8.1)

## 2019-05-07 LAB — CBG MONITORING, ED: Glucose-Capillary: 94 mg/dL (ref 70–99)

## 2019-05-07 LAB — GLUCOSE, CAPILLARY: Glucose-Capillary: 130 mg/dL — ABNORMAL HIGH (ref 70–99)

## 2019-05-07 LAB — SARS CORONAVIRUS 2 BY RT PCR (HOSPITAL ORDER, PERFORMED IN ~~LOC~~ HOSPITAL LAB): SARS Coronavirus 2: NEGATIVE

## 2019-05-07 MED ORDER — INSULIN ASPART 100 UNIT/ML ~~LOC~~ SOLN: 0.0000 [IU] | Freq: Every day | SUBCUTANEOUS | Status: DC

## 2019-05-07 MED ORDER — DOCUSATE SODIUM 100 MG PO CAPS
100.0000 mg | ORAL_CAPSULE | Freq: Two times a day (BID) | ORAL | Status: DC
  Administered 2019-05-07 – 2019-05-10 (×6): 100 mg via ORAL
  Filled 2019-05-07 (×6): qty 1

## 2019-05-07 MED ORDER — ONDANSETRON HCL 4 MG PO TABS: 4.0000 mg | ORAL_TABLET | Freq: Four times a day (QID) | ORAL | Status: DC | PRN

## 2019-05-07 MED ORDER — METHOCARBAMOL 500 MG PO TABS
750.0000 mg | ORAL_TABLET | Freq: Three times a day (TID) | ORAL | Status: DC
  Administered 2019-05-07 – 2019-05-10 (×9): 750 mg via ORAL
  Filled 2019-05-07 (×9): qty 2

## 2019-05-07 MED ORDER — CARBOXYMETHYLCELLULOSE SODIUM 0.25 % OP SOLN: 1.0000 [drp] | OPHTHALMIC | Status: DC

## 2019-05-07 MED ORDER — MELOXICAM 7.5 MG PO TABS
7.5000 mg | ORAL_TABLET | Freq: Two times a day (BID) | ORAL | Status: DC
  Administered 2019-05-07 – 2019-05-08 (×2): 7.5 mg via ORAL
  Filled 2019-05-07 (×3): qty 1

## 2019-05-07 MED ORDER — TORSEMIDE 20 MG PO TABS
20.0000 mg | ORAL_TABLET | Freq: Two times a day (BID) | ORAL | Status: DC
  Administered 2019-05-08 – 2019-05-10 (×3): 20 mg via ORAL
  Filled 2019-05-07 (×6): qty 1

## 2019-05-07 MED ORDER — SODIUM CHLORIDE 0.9 % IV SOLN
1.0000 g | INTRAVENOUS | Status: AC
  Administered 2019-05-08 – 2019-05-10 (×3): 1 g via INTRAVENOUS
  Filled 2019-05-07 (×3): qty 1

## 2019-05-07 MED ORDER — FLEET ENEMA 7-19 GM/118ML RE ENEM: 1.0000 | ENEMA | Freq: Once | RECTAL | Status: DC | PRN

## 2019-05-07 MED ORDER — SODIUM CHLORIDE 0.9 % IV SOLN
1.0000 g | Freq: Once | INTRAVENOUS | Status: AC
  Administered 2019-05-07: 1 g via INTRAVENOUS
  Filled 2019-05-07: qty 10

## 2019-05-07 MED ORDER — ENOXAPARIN SODIUM 40 MG/0.4ML ~~LOC~~ SOLN
40.0000 mg | SUBCUTANEOUS | Status: DC
  Administered 2019-05-07 – 2019-05-10 (×3): 40 mg via SUBCUTANEOUS
  Filled 2019-05-07 (×3): qty 0.4

## 2019-05-07 MED ORDER — ALBUTEROL SULFATE (2.5 MG/3ML) 0.083% IN NEBU: 2.5000 mg | INHALATION_SOLUTION | RESPIRATORY_TRACT | Status: DC | PRN

## 2019-05-07 MED ORDER — ACETAMINOPHEN 650 MG RE SUPP: 650.0000 mg | Freq: Four times a day (QID) | RECTAL | Status: DC | PRN

## 2019-05-07 MED ORDER — MELATONIN 5 MG PO TABS
5.0000 mg | ORAL_TABLET | Freq: Every day | ORAL | Status: DC
  Administered 2019-05-07 – 2019-05-10 (×3): 5 mg via ORAL
  Filled 2019-05-07 (×4): qty 1

## 2019-05-07 MED ORDER — OXYCODONE HCL 5 MG PO TABS: 5.0000 mg | ORAL_TABLET | ORAL | Status: DC | PRN

## 2019-05-07 MED ORDER — ISOSORBIDE MONONITRATE ER 30 MG PO TB24
30.0000 mg | ORAL_TABLET | Freq: Every day | ORAL | Status: DC
  Administered 2019-05-08 – 2019-05-10 (×3): 30 mg via ORAL
  Filled 2019-05-07 (×3): qty 1

## 2019-05-07 MED ORDER — INSULIN ASPART 100 UNIT/ML ~~LOC~~ SOLN
0.0000 [IU] | Freq: Three times a day (TID) | SUBCUTANEOUS | Status: DC
  Administered 2019-05-09 – 2019-05-10 (×2): 1 [IU] via SUBCUTANEOUS

## 2019-05-07 MED ORDER — SODIUM CHLORIDE 0.9 % IV BOLUS (SEPSIS)
250.0000 mL | Freq: Once | INTRAVENOUS | Status: AC
  Administered 2019-05-07: 250 mL via INTRAVENOUS

## 2019-05-07 MED ORDER — ONDANSETRON HCL 4 MG/2ML IJ SOLN: 4.0000 mg | Freq: Four times a day (QID) | INTRAMUSCULAR | Status: DC | PRN

## 2019-05-07 MED ORDER — GALANTAMINE HYDROBROMIDE ER 8 MG PO CP24
16.0000 mg | ORAL_CAPSULE | Freq: Every day | ORAL | Status: DC
  Administered 2019-05-08 – 2019-05-10 (×3): 16 mg via ORAL
  Filled 2019-05-07 (×3): qty 2

## 2019-05-07 MED ORDER — CARVEDILOL 3.125 MG PO TABS
3.1250 mg | ORAL_TABLET | Freq: Two times a day (BID) | ORAL | Status: DC
  Administered 2019-05-07 – 2019-05-10 (×6): 3.125 mg via ORAL
  Filled 2019-05-07 (×6): qty 1

## 2019-05-07 MED ORDER — INSULIN GLARGINE 100 UNIT/ML ~~LOC~~ SOLN
10.0000 [IU] | Freq: Every day | SUBCUTANEOUS | Status: DC
  Administered 2019-05-07 – 2019-05-10 (×3): 10 [IU] via SUBCUTANEOUS
  Filled 2019-05-07 (×4): qty 0.1

## 2019-05-07 MED ORDER — LISINOPRIL 20 MG PO TABS
20.0000 mg | ORAL_TABLET | Freq: Every day | ORAL | Status: DC
  Administered 2019-05-08 – 2019-05-10 (×3): 20 mg via ORAL
  Filled 2019-05-07 (×3): qty 1

## 2019-05-07 MED ORDER — BISACODYL 10 MG RE SUPP: 10.0000 mg | Freq: Every day | RECTAL | Status: DC | PRN

## 2019-05-07 MED ORDER — ACETAMINOPHEN 325 MG PO TABS: 650.0000 mg | ORAL_TABLET | Freq: Four times a day (QID) | ORAL | Status: DC | PRN

## 2019-05-07 MED ORDER — POLYVINYL ALCOHOL 1.4 % OP SOLN
1.0000 [drp] | Freq: Every day | OPHTHALMIC | Status: DC | PRN
  Filled 2019-05-07: qty 15

## 2019-05-07 MED ORDER — SODIUM CHLORIDE 0.9 % IV SOLN
1000.0000 mL | INTRAVENOUS | Status: DC
  Administered 2019-05-07 – 2019-05-10 (×6): 1000 mL via INTRAVENOUS

## 2019-05-07 MED ORDER — QUETIAPINE FUMARATE 25 MG PO TABS
50.0000 mg | ORAL_TABLET | Freq: Every day | ORAL | Status: DC
  Administered 2019-05-07 – 2019-05-10 (×3): 50 mg via ORAL
  Filled 2019-05-07 (×3): qty 2

## 2019-05-07 MED ORDER — PRAVASTATIN SODIUM 40 MG PO TABS
80.0000 mg | ORAL_TABLET | Freq: Every day | ORAL | Status: DC
  Administered 2019-05-07 – 2019-05-10 (×3): 80 mg via ORAL
  Filled 2019-05-07 (×3): qty 2

## 2019-05-07 MED ORDER — TRAZODONE HCL 50 MG PO TABS
50.0000 mg | ORAL_TABLET | Freq: Every day | ORAL | Status: DC
  Administered 2019-05-07 – 2019-05-10 (×3): 50 mg via ORAL
  Filled 2019-05-07 (×3): qty 1

## 2019-05-07 MED ORDER — SENNOSIDES-DOCUSATE SODIUM 8.6-50 MG PO TABS: 1.0000 | ORAL_TABLET | Freq: Every evening | ORAL | Status: DC | PRN
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
## 2019-05-07 NOTE — Progress Notes (Signed)
Right lower extremity venous duplex has been completed. Preliminary results can be found in CV Proc through chart review.  Results were given to [facility] PA.  05/07/19 11:16 AM [name] RVT

## 2019-05-07 NOTE — ED Triage Notes (Signed)
EMS reports from home, Increased weakness over last couple days, Pt c/o bilateral leg and left knee pain. Pt also states decreased urine output.   BP 160 HR 91 RR18 Sp02 94 CBG 90 99.0  20ga L wrist

## 2019-05-07 NOTE — ED Notes (Addendum)
Patient states that he does not ambulate and has pain and weakness at baseline. When asked how patient gets around at home, patient states that he uses an IT trainer wheelchair. Will continue to monitor patient.
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
## 2019-05-08 DIAGNOSIS — E1169 Type 2 diabetes mellitus with other specified complication: Secondary | ICD-10-CM

## 2019-05-08 DIAGNOSIS — N3000 Acute cystitis without hematuria: Secondary | ICD-10-CM

## 2019-05-08 DIAGNOSIS — E785 Hyperlipidemia, unspecified: Secondary | ICD-10-CM

## 2019-05-08 DIAGNOSIS — D649 Anemia, unspecified: Secondary | ICD-10-CM

## 2019-05-08 LAB — URINE CULTURE

## 2019-05-08 LAB — CBC
HCT: 37.3 % — ABNORMAL LOW (ref 39.0–52.0)
Hemoglobin: 11.4 g/dL — ABNORMAL LOW (ref 13.0–17.0)
MCH: 28.9 pg (ref 26.0–34.0)
MCHC: 30.6 g/dL (ref 30.0–36.0)
MCV: 94.7 fL (ref 80.0–100.0)
Platelets: 200 10*3/uL (ref 150–400)
RBC: 3.94 MIL/uL — ABNORMAL LOW (ref 4.22–5.81)
RDW: 14.5 % (ref 11.5–15.5)
WBC: 20 10*3/uL — ABNORMAL HIGH (ref 4.0–10.5)
nRBC: 0 % (ref 0.0–0.2)

## 2019-05-08 LAB — BASIC METABOLIC PANEL
Anion gap: 8 (ref 5–15)
BUN: 24 mg/dL — ABNORMAL HIGH (ref 8–23)
CO2: 27 mmol/L (ref 22–32)
Calcium: 8.4 mg/dL — ABNORMAL LOW (ref 8.9–10.3)
Chloride: 107 mmol/L (ref 98–111)
Creatinine, Ser: 1.07 mg/dL (ref 0.61–1.24)
GFR calc Af Amer: >60 mL/min (ref >60)
GFR calc non Af Amer: >60 mL/min (ref >60)
Glucose, Bld: 88 mg/dL (ref 70–99)
Potassium: 4.3 mmol/L (ref 3.5–5.1)
Sodium: 142 mmol/L (ref 135–145)

## 2019-05-08 LAB — GLUCOSE, CAPILLARY
Glucose-Capillary: 77 mg/dL (ref 70–99)
Glucose-Capillary: 102 mg/dL — ABNORMAL HIGH (ref 70–99)
Glucose-Capillary: 109 mg/dL — ABNORMAL HIGH (ref 70–99)
Glucose-Capillary: 131 mg/dL — ABNORMAL HIGH (ref 70–99)

## 2019-05-08 MED ORDER — GUAIFENESIN-DM 100-10 MG/5ML PO SYRP: 5.0000 mL | ORAL_SOLUTION | ORAL | Status: DC | PRN

## 2019-05-08 MED ORDER — GUAIFENESIN ER 600 MG PO TB12
1200.0000 mg | ORAL_TABLET | Freq: Two times a day (BID) | ORAL | Status: DC
  Administered 2019-05-08 – 2019-05-10 (×5): 1200 mg via ORAL
  Filled 2019-05-08 (×5): qty 2

## 2019-05-08 NOTE — [facility] Notes (Signed)
PROGRESS NOTE    COBE VINEY  UUV:[id number] DOB: May 20, 1932 DOA: 05/07/2019 PCP: [name], MD   Brief Narrative:  HPI per Dr. Wendee Beavers on 05/07/2019 [name] is a 85 y.o. male with history of IDDM-2,  depression,  hyponatremia, right lower extremity edema, arthritis, multiple back surgeries (6 times), radiculopathy and wheelchair-bound presenting with acute on chronic weakness and UTI symptoms.  History obtained from patient and patient's wife at bedside.  Patient reports worsening weakness and frequent falls that he describes as "sliding down".  Reports about 5 falls in the last 1 month.  He denies hitting his head or loss of consciousness.  He also reports dysuria but denies urgency or frequency.  He denies fever but admits to chills.  He denies chest pain, dyspnea, orthopnea, PND, nausea, vomiting, abdominal pain or other acute changes to his back pain.  He admits to constipation.  He states last bowel movement was 2 days ago.  He attributes this to poor p.o. intake.  He endorses "head cold".  He also reports dry cough which is chronic for him.    Lives with his wife.  He is wheelchair-bound.  He has motorized wheelchair.  He goes to PT at [place].  He has home health aide twice a week.  He denies smoking cigarettes, drinking alcohol recreational drug use.  In ED, febrile to 100 (geriatric).  HR, RR, BP and O2 within normal on arrival but gotten tachypneic to 20s.  CMP not impressive.  WBC 25.  Hgb 12.6.  Otherwise CBC not impressive.  COVID-19 negative.  CXR without acute finding.  Lower extremity ultrasound negative for DVT.  UA with moderate LAE, 5 ketones and few bacteria otherwise not impressive.  Urine culture sent.  Started on ceftriaxone and hospitalist service was called for admission for UTI and recurrent falls.   **Interim History Patient still continues to be weak and was complaining of having a "head cold".  Also complained of having some discomfort in his  urine.  PT OT recommending SNF versus home health PT and will repeat urine culture as urine culture showed multiple species present.  Assessment & Plan:   Active Problems:   UTI (urinary tract infection)  Acute urinary tract infection: patient with dysuria, fever and leukocytosis.   -Urinalysis done and showed hazy appearance with 5 ketones, moderate leukocytes, few bacteria, greater than 50 WBCs, 0-5 squamous epithelial cells -Urine culture done and showed multiple species present and recommend recollection so we will reorder a urine culture -Patient was complaining of burning and discomfort on urination -Continue IV Ceftriaxone for now until sensitivities are resulted -Repeat urine culture now -Cytosis is improving and WBC went from 24.7 is now 20.0  Recurrent fall at home: patient is wheelchair-bound at baseline.   -Has outpatient PT at [place].  Has [facility] aide at home.  Reports worsening weakness to the extent of difficulty transferring. -PT/OT eval recommending SNF vs Home Health PT vs [facility]; (OT though just recommends SNF with 24 Hour Supervision/Assistance -C/w Fall Precautions  Arthritis/chronic back pain/radiculopathy/lower extremity weakness -Currently No red flags.  Motor weakness in right leg which is chronic. -Pain control and therapy as above -C/w oxycodone 5 mg p.o. every 4 hours PRN for moderate pain along with methocarbamol 750 mg p.o. every 8 hours Currently on meloxicam 7.5 mg p.o. 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  Euglycemic. -Continue home Lantus at 10 units nightly -  Continue with sensitive NovoLog sliding scale insulin before meals and at bedtime -CBGs are currently ranging from 77-130 We will continue to monitor and treat trend blood sugars and adjust insulin as necessary -Repeat HbA1c  HLD -C/w Pravastatin   Lower extremity edema, right greater than the left-chronic -Ultrasound negative for DVT  -Continue home Torsemide 20 mg po Daily  Hypertension -Currently Normotensive -Continue home medications with carvedilol 3.125 mg p.o. twice daily along with lisinopril 20 mg p.o. daily  Constipation likely secondary to opiates -Last bowel movement was approximately 3 days ago -Bowel regimen initiated and will continue and continue senna docusate 1 tab p.o. nightly as needed for mild constipation, unclear Amao rectally once PRN for severe constipation as well as milligrams p.o. twice daily -Also continue bisacodyl 10 mg rectally daily PRN for moderate constipation -May need to adjust as necessary  PTSD/Depression/Dementia -C?w Galantamine 16 mg po Daily, Quetiapime 50 mg po qHS, and Trazodone 50 mg po qHS   GERD/Hx of Gastric Perforation and history of duodenal ulcer/history of Helicobacter pylori gastritis -We will hold Meloxicam while hospitalized -Currently does not appear to be on any PPI based on home medication reconciliation  Hx of PE -Currently not on anticoagulation  OSA and history of Pulmonary Hypertension -We will order CPAP but unsure of compliance  Normocytic Anemia -The patient's hemoglobin/hematocrit went from 12.6/40.8 and is now 11.4/37.3 -Check anemia panel in the a.m. -Continue to monitor for signs and symptoms of bleeding; currently no overt bleeding noted -Repeat CBC in a.m.  Obesity -Estimated body mass index is 34.31 kg/m as calculated from the following:   Height as of this encounter: 5\' 11"  (1.803 m).   Weight as of this encounter: 111.6 kg. -Weight Loss and Dietary Counseling given  DVT prophylaxis: Enoxaparin 40 mg sq q24h Code Status: FULL CODE  Family Communication: Discussed with wife at bedside Disposition Plan: SNF versus home health PT pending clinical course  Consultants:   None   Procedures: Right Lower Extremity Venous Duplex Summary: Right: There is no evidence of deep vein thrombosis in the lower extremity. No cystic  structure found in the popliteal fossa. Left: No evidence of common femoral vein obstruction.     Antimicrobials:  Anti-infectives (From admission, onward)   Start     Dose/Rate Route Frequency Ordered Stop   05/08/19 1400  cefTRIAXone (ROCEPHIN) 1 g in sodium chloride 0.9 % 100 mL IVPB     1 g 200 mL/hr over 30 Minutes Intravenous Every 24 hours 05/07/19 2148     05/07/19 1400  cefTRIAXone (ROCEPHIN) 1 g in sodium chloride 0.9 % 100 mL IVPB     1 g 200 mL/hr over 30 Minutes Intravenous  Once 05/07/19 1347 05/07/19 1534     Subjective: Seen and examined at bedside and states that he is complaining of a "head cold".  No chest pain, lightheadedness or dizziness but states that he is coughing up clear sputum.  Complains of being weak and having some dysuria and burning in his urine.  No nausea or vomiting.  No other concerns at this time.  Objective: Vitals:   05/07/19 1930 05/07/19 2030 05/07/19 2140 05/08/19 0601  BP: (!) 148/73 140/68 (!) 143/63 (!) 126/59  Pulse: 81 82 89 79  Resp: 20 19 16 16   Temp:   98.7 F (37.1 C) 98 F (36.7 C)  TempSrc:      SpO2: 93% 94% 96% 93%  Weight:      Height:  Intake/Output Summary (Last 24 hours) at 05/08/2019 0828 Last data filed at 05/08/2019 0630 Gross per 24 hour  Intake 1872.21 ml  Output 175 ml  Net 1697.21 ml   Filed Weights   05/07/19 0812  Weight: 111.6 kg   Examination: Physical Exam:  Constitutional: WN/WD morbidly obese Caucasian male currently in NAD and appears calm and comfortable Eyes: Lids and conjunctivae normal, sclerae anicteric  ENMT: External Ears, Nose appear normal. Grossly normal hearing. Mucous membranes are moist.  Neck: Appears normal, supple, no cervical masses, normal ROM, no appreciable thyromegaly; no JVD Respiratory: Diminished to auscultation bilaterally, no wheezing, rales, rhonchi or crackles. Normal respiratory effort and patient is not tachypenic. No accessory muscle use.  Cardiovascular:  RRR, no murmurs / rubs / gallops. S1 and S2 auscultated.  Has right lower extremity edema worse than left Abdomen: Soft, non-tender, significantly distended secondary body habitus. No masses palpated. No appreciable hepatosplenomegaly. Bowel sounds positive x4.  GU: Deferred. Musculoskeletal: No clubbing / cyanosis of digits/nails. No joint deformity upper and lower extremities.  Skin: No rashes, lesions, ulcers on limited skin evaluation. No induration; Warm and dry.  Neurologic: CN 2-12 grossly intact with no focal deficits. Romberg sign and cerebellar reflexes not assessed.  Psychiatric: Normal judgment and insight. Alert and oriented x 3.  Slightly anxious mood and appropriate affect.   Data Reviewed: I have personally reviewed following labs and imaging studies  CBC: Recent Labs  Lab 05/07/19 0910 05/08/19 0455  WBC 24.7* 20.0*  NEUTROABS 19.6*  --   HGB 12.6* 11.4*  HCT 40.8 37.3*  MCV 93.6 94.7  PLT 226 630   Basic Metabolic Panel: Recent Labs  Lab 05/07/19 0910 05/08/19 0455  NA 142 142  K 4.3 4.3  CL 105 107  CO2 28 27  GLUCOSE 99 88  BUN 27* 24*  CREATININE 1.17 1.07  CALCIUM 9.1 8.4*   GFR: Estimated Creatinine Clearance: 64.1 mL/min (by C-G formula based on SCr of 1.07 mg/dL). Liver Function Tests: Recent Labs  Lab 05/07/19 0910  AST 17  ALT 14  ALKPHOS 77  BILITOT 0.6  PROT 6.7  ALBUMIN 3.3*   No results for input(s): LIPASE, AMYLASE in the last 168 hours. No results for input(s): AMMONIA in the last 168 hours. Coagulation Profile: No results for input(s): INR, PROTIME in the last 168 hours. Cardiac Enzymes: No results for input(s): CKTOTAL, CKMB, CKMBINDEX, TROPONINI in the last 168 hours. BNP (last 3 results) No results for input(s): PROBNP in the last 8760 hours. HbA1C: No results for input(s): HGBA1C in the last 72 hours. CBG: Recent Labs  Lab 05/07/19 0859 05/07/19 2156 05/08/19 0733  GLUCAP 94 130* 77   Lipid Profile: No results  for input(s): CHOL, HDL, LDLCALC, TRIG, CHOLHDL, LDLDIRECT in the last 72 hours. Thyroid Function Tests: No results for input(s): TSH, T4TOTAL, FREET4, T3FREE, THYROIDAB in the last 72 hours. Anemia Panel: No results for input(s): VITAMINB12, FOLATE, FERRITIN, TIBC, IRON, RETICCTPCT in the last 72 hours. Sepsis Labs: No results for input(s): PROCALCITON, LATICACIDVEN in the last 168 hours.  Recent Results (from the past 240 hour(s))  SARS Coronavirus 2 [facility] order, Performed in [facility] hospital lab) Nasopharyngeal Nasopharyngeal Swab     Status: None   Collection Time: 05/07/19  1:54 PM   Specimen: Nasopharyngeal Swab  Result Value Ref Range Status   SARS Coronavirus 2 NEGATIVE NEGATIVE Final    Comment: (NOTE) If result is NEGATIVE SARS-CoV-2 target nucleic acids are NOT DETECTED. The SARS-CoV-2  RNA is generally detectable in upper and lower  respiratory specimens during the acute phase of infection. The lowest  concentration of SARS-CoV-2 viral copies this assay can detect is 250  copies / mL. A negative result does not preclude SARS-CoV-2 infection  and should not be used as the sole basis for treatment or other  patient management decisions.  A negative result may occur with  improper specimen collection / handling, submission of specimen other  than nasopharyngeal swab, presence of viral mutation(s) within the  areas targeted by this assay, and inadequate number of viral copies  (<250 copies / mL). A negative result must be combined with clinical  observations, patient history, and epidemiological information. If result is POSITIVE SARS-CoV-2 target nucleic acids are DETECTED. The SARS-CoV-2 RNA is generally detectable in upper and lower  respiratory specimens dur ing the acute phase of infection.  Positive  results are indicative of active infection with SARS-CoV-2.  Clinical  correlation with patient history and other diagnostic information is  necessary to determine  patient infection status.  Positive results do  not rule out bacterial infection or co-infection with other viruses. If result is PRESUMPTIVE POSTIVE SARS-CoV-2 nucleic acids MAY BE PRESENT.   A presumptive positive result was obtained on the submitted specimen  and confirmed on repeat testing.  While 2019 novel coronavirus  (SARS-CoV-2) nucleic acids may be present in the submitted sample  additional confirmatory testing may be necessary for epidemiological  and / or clinical management purposes  to differentiate between  SARS-CoV-2 and other Sarbecovirus currently known to infect humans.  If clinically indicated additional testing with an alternate test  methodology [phone]) is advised. The SARS-CoV-2 RNA is generally  detectable in upper and lower respiratory sp ecimens during the acute  phase of infection. The expected result is Negative. Fact Sheet for Patients:  StrictlyIdeas.no Fact Sheet for Healthcare Providers: BankingDealers.co.za This test is not yet approved or cleared by the [place] FDA and has been authorized for detection and/or diagnosis of SARS-CoV-2 by FDA under an Emergency Use Authorization (EUA).  This EUA will remain in effect (meaning this test can be used) for the duration of the COVID-19 declaration under Section 564(b)(1) of the Act, 21 U.S.C. section 360bbb-3(b)(1), unless the authorization is terminated or revoked sooner. Performed at [facility], [place] [address], [place], [place] [address]     RN Pressure Injury Documentation: Pressure Injury 07/20/16 Stage I -  Intact skin with non-blanchable redness of a localized area usually over a bony prominence. (Active)  07/20/16 1200  Location: Sacrum  Location Orientation: Medial  Staging: Stage I -  Intact skin with non-blanchable redness of a localized area usually over a bony prominence.  Wound Description (Comments):   Present on  Admission:    Radiology Studies: Dg Chest 2 View  Result Date: 05/07/2019 CLINICAL DATA:  Chest pain. EXAM: CHEST - 2 VIEW COMPARISON:  Radiograph May 11, 2018. FINDINGS: The heart size and mediastinal contours are within normal limits. Both lungs are clear. No pneumothorax or pleural effusion is noted. The visualized skeletal structures are unremarkable. IMPRESSION: No active cardiopulmonary disease. Electronically Signed   By: Marijo Conception M.D.   On: 05/07/2019 09:58   Vas [place] Lower Extremity Venous (dvt) (only Mc & Wl 7a-7p)  Result Date: 05/07/2019  Lower Venous Study Indications: Swelling.  Risk Factors: None identified. Limitations: Body habitus and poor ultrasound/tissue interface. Comparison Study: 09/17/2017 - Negative for DVT. Performing Technologist: Oliver Hum RVT  Examination Guidelines: A complete evaluation includes B-mode imaging, spectral Doppler, color Doppler, and power Doppler as needed of all accessible portions of each vessel. Bilateral testing is considered an integral part of a complete examination. Limited examinations for reoccurring indications may be performed as noted.  +---------+---------------+---------+-----------+----------+--------------+ RIGHT    CompressibilityPhasicitySpontaneityPropertiesThrombus Aging +---------+---------------+---------+-----------+----------+--------------+ CFV      Full           Yes      Yes                                 +---------+---------------+---------+-----------+----------+--------------+ SFJ      Full                                                        +---------+---------------+---------+-----------+----------+--------------+ FV Prox  Full                                                        +---------+---------------+---------+-----------+----------+--------------+ FV Mid   Full                                                         +---------+---------------+---------+-----------+----------+--------------+ FV DistalFull                                                        +---------+---------------+---------+-----------+----------+--------------+ PFV      Full                                                        +---------+---------------+---------+-----------+----------+--------------+ POP      Full           Yes      Yes                                 +---------+---------------+---------+-----------+----------+--------------+ PTV      Full                                                        +---------+---------------+---------+-----------+----------+--------------+ PERO     Full                                                        +---------+---------------+---------+-----------+----------+--------------+   +----+---------------+---------+-----------+----------+--------------+  LEFTCompressibilityPhasicitySpontaneityPropertiesThrombus Aging +----+---------------+---------+-----------+----------+--------------+ CFV Full           Yes      Yes                                 +----+---------------+---------+-----------+----------+--------------+     Summary: Right: There is no evidence of deep vein thrombosis in the lower extremity. No cystic structure found in the popliteal fossa. Left: No evidence of common femoral vein obstruction.  *See table(s) above for measurements and observations. Electronically signed by Servando Snare MD on 05/07/2019 at 1:09:57 PM.    Final    Scheduled Meds: . carvedilol  3.125 mg Oral BID  . docusate sodium  100 mg Oral BID  . enoxaparin (LOVENOX) injection  40 mg Subcutaneous Q24H  . galantamine  16 mg Oral Q breakfast  . insulin aspart  0-5 Units Subcutaneous QHS  . insulin aspart  0-9 Units Subcutaneous TID WC  . insulin glargine  10 Units Subcutaneous QHS  . isosorbide mononitrate  30 mg Oral Daily  . lisinopril  20 mg Oral Daily  . Melatonin  5  mg Oral QHS  . meloxicam  7.5 mg Oral BID  . methocarbamol  750 mg Oral Q8H  . pravastatin  80 mg Oral QHS  . QUEtiapine  50 mg Oral QHS  . torsemide  20 mg Oral BID  . traZODone  50 mg Oral QHS   Continuous Infusions: . sodium chloride 1,000 mL (05/07/19 2302)  . cefTRIAXone (ROCEPHIN)  IV      LOS: 1 day   Kerney Elbe, DO [facility] PAGER is on AMION  If 7PM-7AM, please contact night-coverage [email or web address] Password [email or web address] 05/08/2019, 8:28 AM
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
## 2019-05-09 DIAGNOSIS — E669 Obesity, unspecified: Secondary | ICD-10-CM

## 2019-05-09 DIAGNOSIS — R531 Weakness: Secondary | ICD-10-CM

## 2019-05-09 LAB — GLUCOSE, CAPILLARY
Glucose-Capillary: 83 mg/dL (ref 70–99)
Glucose-Capillary: 117 mg/dL — ABNORMAL HIGH (ref 70–99)
Glucose-Capillary: 143 mg/dL — ABNORMAL HIGH (ref 70–99)
Glucose-Capillary: 143 mg/dL — ABNORMAL HIGH (ref 70–99)

## 2019-05-09 LAB — RETICULOCYTES
Immature Retic Fract: 12.6 % (ref 2.3–15.9)
RBC.: 3.98 MIL/uL — ABNORMAL LOW (ref 4.22–5.81)
Retic Count, Absolute: 39.4 10*3/uL (ref 19.0–186.0)
Retic Ct Pct: 1 % (ref 0.4–3.1)

## 2019-05-09 LAB — CBC WITH DIFFERENTIAL/PLATELET
Abs Immature Granulocytes: 0.04 10*3/uL (ref 0.00–0.07)
Basophils Absolute: 0 10*3/uL (ref 0.0–0.1)
Basophils Relative: 0 %
Eosinophils Absolute: 0.3 10*3/uL (ref 0.0–0.5)
Eosinophils Relative: 3 %
HCT: 37.9 % — ABNORMAL LOW (ref 39.0–52.0)
Hemoglobin: 11.6 g/dL — ABNORMAL LOW (ref 13.0–17.0)
Immature Granulocytes: 0 %
Lymphocytes Relative: 17 %
Lymphs Abs: 1.6 10*3/uL (ref 0.7–4.0)
MCH: 28.6 pg (ref 26.0–34.0)
MCHC: 30.6 g/dL (ref 30.0–36.0)
MCV: 93.6 fL (ref 80.0–100.0)
Monocytes Absolute: 1.2 10*3/uL — ABNORMAL HIGH (ref 0.1–1.0)
Monocytes Relative: 13 %
Neutro Abs: 6 10*3/uL (ref 1.7–7.7)
Neutrophils Relative %: 67 %
Platelets: 213 10*3/uL (ref 150–400)
RBC: 4.05 MIL/uL — ABNORMAL LOW (ref 4.22–5.81)
RDW: 14.1 % (ref 11.5–15.5)
WBC: 9.1 10*3/uL (ref 4.0–10.5)
nRBC: 0 % (ref 0.0–0.2)

## 2019-05-09 LAB — COMPREHENSIVE METABOLIC PANEL
ALT: 15 U/L (ref 0–44)
AST: 13 U/L — ABNORMAL LOW (ref 15–41)
Albumin: 2.7 g/dL — ABNORMAL LOW (ref 3.5–5.0)
Alkaline Phosphatase: 80 U/L (ref 38–126)
Anion gap: 10 (ref 5–15)
BUN: 25 mg/dL — ABNORMAL HIGH (ref 8–23)
CO2: 26 mmol/L (ref 22–32)
Calcium: 7.6 mg/dL — ABNORMAL LOW (ref 8.9–10.3)
Chloride: 104 mmol/L (ref 98–111)
Creatinine, Ser: 1.12 mg/dL (ref 0.61–1.24)
GFR calc Af Amer: >60 mL/min (ref >60)
GFR calc non Af Amer: 60 mL/min — ABNORMAL LOW (ref >60)
Glucose, Bld: 98 mg/dL (ref 70–99)
Potassium: 3.9 mmol/L (ref 3.5–5.1)
Sodium: 140 mmol/L (ref 135–145)
Total Bilirubin: 0.7 mg/dL (ref 0.3–1.2)
Total Protein: 6 g/dL — ABNORMAL LOW (ref 6.5–8.1)

## 2019-05-09 LAB — HEMOGLOBIN A1C
Hgb A1c MFr Bld: 6.7 % — ABNORMAL HIGH (ref 4.8–5.6)
Mean Plasma Glucose: 146 mg/dL

## 2019-05-09 LAB — IRON AND TIBC
Iron: 26 ug/dL — ABNORMAL LOW (ref 45–182)
Saturation Ratios: 10 % — ABNORMAL LOW (ref 17.9–39.5)
TIBC: 252 ug/dL (ref 250–450)
UIBC: 226 ug/dL

## 2019-05-09 LAB — PHOSPHORUS: Phosphorus: 3.2 mg/dL (ref 2.5–4.6)

## 2019-05-09 LAB — FOLATE: Folate: 11.3 ng/mL (ref >5.9)

## 2019-05-09 LAB — VITAMIN B12: Vitamin B-12: 720 pg/mL (ref 180–914)

## 2019-05-09 LAB — MAGNESIUM: Magnesium: 1.9 mg/dL (ref 1.7–2.4)

## 2019-05-09 LAB — FERRITIN: Ferritin: 202 ng/mL (ref 24–336)

## 2019-05-09 MED ORDER — GLUCERNA SHAKE PO LIQD
237.0000 mL | Freq: Three times a day (TID) | ORAL | Status: DC
  Administered 2019-05-09 – 2019-05-10 (×3): 237 mL via ORAL
  Filled 2019-05-09 (×5): qty 237

## 2019-05-09 MED ORDER — POLYSACCHARIDE IRON COMPLEX 150 MG PO CAPS
150.0000 mg | ORAL_CAPSULE | Freq: Every day | ORAL | Status: DC
  Administered 2019-05-09 – 2019-05-10 (×2): 150 mg via ORAL
  Filled 2019-05-09 (×2): qty 1

## 2019-05-09 NOTE — NC FL2 (Signed)
[place] LEVEL OF CARE SCREENING TOOL     IDENTIFICATION  Patient Name: [name] Birthdate: 03-Sep-1931 Sex: male Admission Date (Current Location): 05/07/2019  [facility] and [place] Number:  Herbalist and Address:  [facility],  [address] [place], [place]      Provider Number: [id number]  Attending Physician Name and Address:  Kerney Elbe, DO  Relative Name and Phone Number:  [phone] Wife [name]    Current Level of Care: Hospital Recommended Level of Care: [place] Prior Approval Number:    Date Approved/Denied:   PASRR Number: [id number]  Discharge Plan: SNF    Current Diagnoses: Patient Active Problem List   Diagnosis Date Noted  . UTI (urinary tract infection) 05/07/2019  . Dyspnea 09/16/2017  . Wheelchair confinement status 07/18/2017  . Bilateral lower extremity edema 07/18/2017  . Cholecystitis 01/16/2017  . Depression 08/15/2016  . GERD (gastroesophageal reflux disease) 08/15/2016  . HLD (hyperlipidemia) 08/15/2016  . Abdominal pain 08/15/2016  . Elevated liver function tests   . Closed right fibular fracture 07/27/2016  . Emphysematous cholecystitis s/p perc cholecystostomy drain 07/21/2016 07/20/2016  . Pressure injury of skin 07/20/2016  . Hx of pulmonary embolus 07/20/2016  . Hyponatremia 07/20/2016  . Urinary retention   . Type 2 diabetes with nephropathy ([place])   . Chronic post-traumatic stress disorder (PTSD) 11/30/2015  . Chronic pain syndrome 11/30/2015  . Morbid obesity due to excess calories ([place]) 11/30/2015  . Nocturia more than twice per night 11/30/2015  . Closed displaced bimalleolar fracture of right ankle 02/20/2015  . Duodenal ulcer, perforated s/p repair/omental patch 11/26/2012  . Helicobacter pylori gastritis 11/26/2012  . Hypotension 11/16/2012  . Leukocytosis 11/12/2012  . Lumbosacral radiculopathy at L5 11/12/2012  . Herniated lumbar intervertebral  disc 11/12/2012  . Essential hypertension, benign 11/12/2012  . Unspecified constipation 11/12/2012  . History of back surgery 11/09/2012  . Obesity 11/09/2012    Orientation RESPIRATION BLADDER Height & Weight     Self, Time, Place, Situation  Normal Continent Weight: 246 lb (111.6 kg) Height:  5\' 11"  (180.3 cm)  BEHAVIORAL SYMPTOMS/MOOD NEUROLOGICAL BOWEL NUTRITION STATUS      Continent Diet(low sodium heart healthy)  AMBULATORY STATUS COMMUNICATION OF NEEDS Skin   Extensive Assist(uses wheelchair) Verbally Normal                       Personal Care Assistance Level of Assistance  Bathing, Dressing, Feeding Bathing Assistance: Maximum assistance Feeding assistance: Limited assistance(set up) Dressing Assistance: Maximum assistance     Functional Limitations Info  Sight, Hearing, Speech Sight Info: Adequate Hearing Info: Adequate Speech Info: Adequate    SPECIAL CARE FACTORS FREQUENCY  PT (By licensed PT), OT (By licensed OT)     PT Frequency: 5x OT Frequency: 5x            Contractures      Additional Factors Info  Code Status, Allergies Code Status Info: full code Allergies Info: oxycodone           Current Medications (05/09/2019):  This is the current hospital active medication list Current Facility-Administered Medications  Medication Dose Route Frequency Provider Last Rate Last Dose  . 0.9 %  sodium chloride infusion  1,000 mL Intravenous Continuous Wendee Beavers T, MD 75 mL/hr at 05/09/19 0308 1,000 mL at 05/09/19 0308  . acetaminophen (TYLENOL) tablet 650 mg  650 mg Oral Q6H PRN Mercy Riding, MD  Or  . acetaminophen (TYLENOL) suppository 650 mg  650 mg Rectal Q6H PRN Gonfa, Taye T, MD      . albuterol (PROVENTIL) (2.5 MG/3ML) 0.083% nebulizer solution 2.5 mg  2.5 mg Nebulization Q2H PRN Gonfa, Taye T, MD      . bisacodyl (DULCOLAX) suppository 10 mg  10 mg Rectal Daily PRN Wendee Beavers T, MD      . carvedilol (COREG) tablet 3.125 mg  3.125  mg Oral BID Wendee Beavers T, MD   3.125 mg at 05/09/19 0858  . cefTRIAXone (ROCEPHIN) 1 g in sodium chloride 0.9 % 100 mL IVPB  1 g Intravenous Q24H Mercy Riding, MD   Stopped at 05/08/19 1345  . docusate sodium (COLACE) capsule 100 mg  100 mg Oral BID Wendee Beavers T, MD   100 mg at 05/09/19 0858  . enoxaparin (LOVENOX) injection 40 mg  40 mg Subcutaneous Q24H Wendee Beavers T, MD   40 mg at 05/08/19 2219  . feeding supplement (GLUCERNA SHAKE) (GLUCERNA SHAKE) liquid 237 mL  237 mL Oral TID BM Sheikh, Omair Latif, DO      . galantamine (RAZADYNE ER) 24 hr capsule 16 mg  16 mg Oral Q breakfast Wendee Beavers T, MD   16 mg at 05/09/19 0858  . guaiFENesin (MUCINEX) 12 hr tablet 1,200 mg  1,200 mg Oral BID Raiford Noble Latif, DO   1,200 mg at 05/09/19 0900  . insulin aspart (novoLOG) injection 0-5 Units  0-5 Units Subcutaneous QHS Gonfa, Taye T, MD      . insulin aspart (novoLOG) injection 0-9 Units  0-9 Units Subcutaneous TID WC Gonfa, Taye T, MD      . insulin glargine (LANTUS) injection 10 Units  10 Units Subcutaneous QHS Mercy Riding, MD   10 Units at 05/08/19 2221  . iron polysaccharides (NIFEREX) capsule 150 mg  150 mg Oral Daily Sheikh, Omair Latif, DO      . isosorbide mononitrate (IMDUR) 24 hr tablet 30 mg  30 mg Oral Daily Wendee Beavers T, MD   30 mg at 05/09/19 0859  . lisinopril (ZESTRIL) tablet 20 mg  20 mg Oral Daily Wendee Beavers T, MD   20 mg at 05/09/19 0859  . Melatonin TABS 5 mg  5 mg Oral QHS Wendee Beavers T, MD   5 mg at 05/08/19 2216  . methocarbamol (ROBAXIN) tablet 750 mg  750 mg Oral Q8H Gonfa, Taye T, MD   750 mg at 05/09/19 0535  . ondansetron (ZOFRAN) tablet 4 mg  4 mg Oral Q6H PRN Gonfa, Taye T, MD       Or  . ondansetron (ZOFRAN) injection 4 mg  4 mg Intravenous Q6H PRN Gonfa, Taye T, MD      . oxyCODONE (Oxy IR/ROXICODONE) immediate release tablet 5 mg  5 mg Oral Q4H PRN Gonfa, Taye T, MD      . polyvinyl alcohol (LIQUIFILM TEARS) 1.4 % ophthalmic solution 1 drop  1 drop Both Eyes  Daily PRN Gonfa, Taye T, MD      . pravastatin (PRAVACHOL) tablet 80 mg  80 mg Oral QHS Wendee Beavers T, MD   80 mg at 05/08/19 2221  . QUEtiapine (SEROQUEL) tablet 50 mg  50 mg Oral QHS Wendee Beavers T, MD   50 mg at 05/08/19 2216  . senna-docusate (Senokot-S) tablet 1 tablet  1 tablet Oral QHS PRN Wendee Beavers T, MD      . sodium phosphate (FLEET) 7-19 GM/118ML enema 1  enema  1 enema Rectal Once PRN Wendee Beavers T, MD      . torsemide (DEMADEX) tablet 20 mg  20 mg Oral BID Wendee Beavers T, MD   20 mg at 05/09/19 0858  . traZODone (DESYREL) tablet 50 mg  50 mg Oral QHS Mercy Riding, MD   50 mg at 05/08/19 2216     Discharge Medications: Please see discharge summary for a list of discharge medications.  Relevant Imaging Results:  Relevant Lab Results:   Additional Information SSN: [id number]  Nila Nephew, LCSW

## 2019-05-09 NOTE — TOC Initial Note (Signed)
Transition of Care [facility]) - Initial/Assessment Note    Patient Details  Name: [name] MRN: [id number] Date of Birth: June 20, 1932  Transition of Care [facility]) CM/SW Contact:    Nila Nephew, LCSW Phone Number: [phone] 05/09/2019, 2:24 PM  Clinical Narrative:   Pt admitted with UTI from home where he resides with his wife. Therapy recommendation for post-acute follow up is for SNF versus HH, pt agreeable to SNF per conversation with physician earlier. CSW attempted to meet with pt- sleeping and did not awake to voice.  Called pt's wife to discuss - phone connection was poor therefore discussion very brief- wife did confirm pt interested in SNF for rehabilitation, noted he has been to facilities in the area before, most recently in April 2020 and they are familiar with process. Noted pt would want to select facility. Completed referrals and will follow up with pt and wife for selection and planning once SNF bed offers obtained.                Expected Discharge Plan: Skilled Nursing Facility Barriers to Discharge: Continued Medical Work up, SNF Pending bed offer   Patient Goals and CMS Choice Patient states their goals for this hospitalization and ongoing recovery are:: pt sleeping CMS Medicare.gov Compare Post Acute Care list provided to:: Other (Comment Required)(wife)    Expected Discharge Plan and Services Expected Discharge Plan: [place] In-house Referral: Clinical Social Work   Post Acute Care Choice: [place] Living arrangements for the past 2 months: Apartment Expected Discharge Date: (unknown)                                    Prior Living Arrangements/Services Living arrangements for the past 2 months: Apartment Lives with:: Spouse Patient language and need for interpreter reviewed:: No Do you feel safe going back to the place where you live?: Yes            Criminal Activity/Legal Involvement Pertinent to Current  Situation/Hospitalization: No - Comment as needed  Activities of Daily Living Home Assistive Devices/Equipment: CBG Meter, Wheelchair, Environmental consultant (specify type), Eyeglasses(electric wheelchair, front wheeled walker) ADL Screening (condition at time of admission) Patient's cognitive ability adequate to safely complete daily activities?: Yes Is the patient deaf or have difficulty hearing?: No Does the patient have difficulty seeing, even when wearing glasses/contacts?: No Does the patient have difficulty concentrating, remembering, or making decisions?: No Patient able to express need for assistance with ADLs?: Yes Does the patient have difficulty dressing or bathing?: Yes Independently performs ADLs?: No Communication: Independent Dressing (OT): Needs assistance Is this a change from baseline?: Pre-admission baseline Grooming: Needs assistance Is this a change from baseline?: Pre-admission baseline Feeding: Needs assistance Is this a change from baseline?: Pre-admission baseline Bathing: Needs assistance Is this a change from baseline?: Pre-admission baseline Toileting: Dependent Is this a change from baseline?: Change from baseline, expected to last >3days In/Out Bed: Dependent Is this a change from baseline?: Change from baseline, expected to last >3 days Walks in Home: Dependent Is this a change from baseline?: Change from baseline, expected to last >3 days Does the patient have difficulty walking or climbing stairs?: Yes Weakness of Legs: Both(R>L) Weakness of Arms/Hands: None  Permission Sought/Granted Permission sought to share information with : Family Supports Permission granted to share information with : (wife listed as contact in chart)  Share Information with NAME: [phone] wife [name]  Emotional Assessment Appearance:: Appears stated age Attitude/Demeanor/Rapport: (UTA- sleeping) Affect (typically observed): (UTA- sleeping) Orientation: : (UTA-  sleeping) Alcohol / Substance Use: Not Applicable Psych Involvement: No (comment)  Admission diagnosis:  Urinary retention [R33.9] Weakness [R53.1] Urinary tract infection without hematuria, site unspecified [N39.0] Patient Active Problem List   Diagnosis Date Noted  . UTI (urinary tract infection) 05/07/2019  . Dyspnea 09/16/2017  . Wheelchair confinement status 07/18/2017  . Bilateral lower extremity edema 07/18/2017  . Cholecystitis 01/16/2017  . Depression 08/15/2016  . GERD (gastroesophageal reflux disease) 08/15/2016  . HLD (hyperlipidemia) 08/15/2016  . Abdominal pain 08/15/2016  . Elevated liver function tests   . Closed right fibular fracture 07/27/2016  . Emphysematous cholecystitis s/p perc cholecystostomy drain 07/21/2016 07/20/2016  . Pressure injury of skin 07/20/2016  . Hx of pulmonary embolus 07/20/2016  . Hyponatremia 07/20/2016  . Urinary retention   . Type 2 diabetes with nephropathy ([place])   . Chronic post-traumatic stress disorder (PTSD) 11/30/2015  . Chronic pain syndrome 11/30/2015  . Morbid obesity due to excess calories ([place]) 11/30/2015  . Nocturia more than twice per night 11/30/2015  . Closed displaced bimalleolar fracture of right ankle 02/20/2015  . Duodenal ulcer, perforated s/p repair/omental patch 11/26/2012  . Helicobacter pylori gastritis 11/26/2012  . Hypotension 11/16/2012  . Leukocytosis 11/12/2012  . Lumbosacral radiculopathy at L5 11/12/2012  . Herniated lumbar intervertebral disc 11/12/2012  . Essential hypertension, benign 11/12/2012  . Unspecified constipation 11/12/2012  . History of back surgery 11/09/2012  . Obesity 11/09/2012   PCP:  [name], MD Pharmacy:   [facility] [address], [place], [place] [address] Phone: [phone] Fax: [place], [place] - [address] [place] [address] OF [address] [place] [address] Phone: [phone] Fax: [phone]  [facility] [place], [place] [address] [place] [address] Phone: [phone] Fax: [phone]     Social Determinants of Health (SDOH) Interventions    Readmission Risk Interventions No flowsheet data found.

## 2019-05-09 NOTE — Progress Notes (Signed)
PROGRESS NOTE    [name]  VQQ:[id number] DOB: 1931/12/25 DOA: 05/07/2019 PCP: [name], MD   Brief Narrative:  HPI per Dr. Wendee Beavers on 05/07/2019 [name] is a 85 y.o. male with history of IDDM-2,  depression,  hyponatremia, right lower extremity edema, arthritis, multiple back surgeries (6 times), radiculopathy and wheelchair-bound presenting with acute on chronic weakness and UTI symptoms.  History obtained from patient and patient's wife at bedside.  Patient reports worsening weakness and frequent falls that he describes as "sliding down".  Reports about 5 falls in the last 1 month.  He denies hitting his head or loss of consciousness.  He also reports dysuria but denies urgency or frequency.  He denies fever but admits to chills.  He denies chest pain, dyspnea, orthopnea, PND, nausea, vomiting, abdominal pain or other acute changes to his back pain.  He admits to constipation.  He states last bowel movement was 2 days ago.  He attributes this to poor p.o. intake.  He endorses "head cold".  He also reports dry cough which is chronic for him.    Lives with his wife.  He is wheelchair-bound.  He has motorized wheelchair.  He goes to PT at [place].  He has home health aide twice a week.  He denies smoking cigarettes, drinking alcohol recreational drug use.  In ED, febrile to 100 (geriatric).  HR, RR, BP and O2 within normal on arrival but gotten tachypneic to 20s.  CMP not impressive.  WBC 25.  Hgb 12.6.  Otherwise CBC not impressive.  COVID-19 negative.  CXR without acute finding.  Lower extremity ultrasound negative for DVT.  UA with moderate LAE, 5 ketones and few bacteria otherwise not impressive.  Urine culture sent.  Started on ceftriaxone and hospitalist service was called for admission for UTI and recurrent falls.   **Interim History Patient still continues to be weak and was complaining of having a "head cold" yesterday.  Also complained of having some discomfort in  his urine but this is improving.  PT OT recommending SNF versus home health PT and will repeat urine culture as urine culture showed multiple species present.  Patient's leukocytosis is now resolved and hemoglobin/hematocrit remained stable.  Assessment & Plan:   Active Problems:   UTI (urinary tract infection)  Acute urinary tract infection: patient with dysuria, fever and leukocytosis, improving -Urinalysis done and showed hazy appearance with 5 ketones, moderate leukocytes, few bacteria, greater than 50 WBCs, 0-5 squamous epithelial cells -Urine culture done and showed multiple species present and recommend recollection so we will reorder a urine culture; repeat urine cultures pending -Patient was complaining of burning and discomfort on urination -Continue IV Ceftriaxone for now until sensitivities are resulted but may not be able to figure out what organism the patient is growing -Repeat urine culture now -LeukoCytosis is improved significantly and WBC went from 24.7 is now 9.0  Recurrent fall at home: patient is wheelchair-bound at baseline.   -Has outpatient PT at [place].  Has [place] aide at home.  Reports worsening weakness to the extent of difficulty transferring. -PT/OT eval recommending SNF vs Home Health PT vs [facility]; (OT though just recommends SNF with 24 Hour Supervision/Assistance -Patient is agreeable to going to a skilled nursing facility so I have consulted social work for further assistance but patient does not want to go back to the previous skilled nursing facility that he has been at -C/w Fall Precautions  Arthritis/chronic back pain/radiculopathy/lower extremity weakness -Currently No  red flags.  Motor weakness in right leg which is chronic. -Pain control and therapy as above -C/w oxycodone 5 mg p.o. every 4 hours PRN for moderate pain along with methocarbamol 750 mg p.o. every 8 hours -Currently on meloxicam 7.5 mg p.o. twice daily but will hold for now  IDDM-2 located  by nephropathy and -Last A1c 7.3 in 1/20.  Euglycemic. -Continue home Lantus at 10 units nightly -Continue with sensitive NovoLog sliding scale insulin before meals and at bedtime -CBGs are currently ranging from 83-131 We will continue to monitor and treat trend blood sugars and adjust insulin as necessary -Repeat HbA1c this visit was 6.7  HLD -C/w Pravastatin 80 mg p.o. nightly  Lower extremity edema, right greater than the left-chronic -Ultrasound negative for DVT -Continue home Torsemide 20 mg po Daily  Hypertension -Currently Normotensive this morning was 127/60 -Continue home medications with carvedilol 3.125 mg p.o. twice daily along with lisinopril 20 mg p.o. daily  Constipation likely secondary to opiates -Last bowel movement was approximately 3 days ago -Bowel regimen initiated and will continue and continue senna docusate 1 tab p.o. nightly as needed for mild constipation, unclear Amao rectally once PRN for severe constipation as well as milligrams p.o. twice daily -Also continue bisacodyl 10 mg rectally daily PRN for moderate constipation -May need to adjust as necessary  PTSD/Depression/Dementia -C/w Galantamine 16 mg po Daily, Quetiapime 50 mg po qHS, and Trazodone 50 mg po qHS   GERD/Hx of Gastric Perforation and history of duodenal ulcer/history of Helicobacter pylori gastritis -We will hold Meloxicam while hospitalized -Currently does not appear to be on any PPI based on home medication reconciliation -Currently not complaining of any reflux symptoms  Hx of PE -Currently not on anticoagulation  OSA and history of Pulmonary Hypertension -We will order CPAP but unsure of compliance  Normocytic Anemia -The patient's hemoglobin/hematocrit went from 12.6/40.8 -> 11.4/37.3 -> 11.6/37.9 -Checked Anemia Panel and showed an iron level of 26, TIBC of 226, TIBC 252, saturation ratio 10%, ferritin level 202, folate level 11.3, and vitamin B12 level 720 -patient may  benefit from IV iron transfusion; we will currently start the patient on Niferex 150 mg po Daily  -Continue to monitor for signs and symptoms of bleeding; currently no overt bleeding noted -Repeat CBC in a.m.  Obesity -Estimated body mass index is 34.31 kg/m as calculated from the following:   Height as of this encounter: 5\' 11"  (1.803 m).   Weight as of this encounter: 111.6 kg. -Weight Loss and Dietary Counseling given  DVT prophylaxis: Enoxaparin 40 mg sq q24h Code Status: FULL CODE  Family Communication: Discussed with wife at bedside Disposition Plan: SNF versus home health PT pending clinical course and will consult social work for SNF placement as patient is agreeable to SNF  Consultants:   None   Procedures: Right Lower Extremity Venous Duplex Summary: Right: There is no evidence of deep vein thrombosis in the lower extremity. No cystic structure found in the popliteal fossa. Left: No evidence of common femoral vein obstruction.   Antimicrobials:  Anti-infectives (From admission, onward)   Start     Dose/Rate Route Frequency Ordered Stop   05/08/19 1400  cefTRIAXone (ROCEPHIN) 1 g in sodium chloride 0.9 % 100 mL IVPB     1 g 200 mL/hr over 30 Minutes Intravenous Every 24 hours 05/07/19 2148     05/07/19 1400  cefTRIAXone (ROCEPHIN) 1 g in sodium chloride 0.9 % 100 mL IVPB     1 g  200 mL/hr over 30 Minutes Intravenous  Once 05/07/19 1347 05/07/19 1534     Subjective: Seen and examined at bedside and he feels weak still but thinks he is improving slightly.  No chest pain, lightheadedness or dizziness.  No nausea or vomiting.  Not complain of the head cold today.  No other concerns or complaints at this time except he states the food was not indicated and he is wanting whole milk.  Objective: Vitals:   05/08/19 0901 05/08/19 1421 05/08/19 2042 05/09/19 0534  BP: (!) 120/54 (!) 107/56  127/64  Pulse: 80 79 76 76  Resp:  17 19 18   Temp:  98.8 F (37.1 C) 99.1 F  (37.3 C) 98.2 F (36.8 C)  TempSrc:   Oral Oral  SpO2:  96% 96% 97%  Weight:      Height:        Intake/Output Summary (Last 24 hours) at 05/09/2019 4403 Last data filed at 05/09/2019 0700 Gross per 24 hour  Intake 2562.77 ml  Output 825 ml  Net 1737.77 ml   Filed Weights   05/07/19 0812  Weight: 111.6 kg   Examination: Physical Exam:  Constitutional: WN/WD obese Caucasian male in NAD and appears calm and but slightly uncomfortable  Eyes: Lids and conjunctivae normal, sclerae anicteric  ENMT: External Ears, Nose appear normal. Grossly normal hearing. Mucous membranes are moist.  Neck: Appears normal, supple, no cervical masses, normal ROM, no appreciable thyromegaly; no jVD Respiratory: Diminished to auscultation bilaterally, no wheezing, rales, rhonchi or crackles. Normal respiratory effort and patient is not tachypenic. No accessory muscle use.  Cardiovascular: RRR, no murmurs / rubs / gallops. S1 and S2 auscultated. Right Leg Edema worse than Left  Abdomen: Soft, non-tender, Distended due to body habitus. No masses palpated. No appreciable hepatosplenomegaly. Bowel sounds positive x4.  GU: Deferred. Musculoskeletal: No clubbing / cyanosis of digits/nails. No joint deformity upper and lower extremities. Skin: No rashes, lesions, ulcers on a limited skin evaluation. No induration; Warm and dry.  Neurologic: CN 2-12 grossly intact with no focal deficits. Romberg sign and cerebellar reflexes not assessed.  Psychiatric: Normal judgment and insight. Alert and oriented x 3. Not as anxious today but normal mood and appropriate affect.    Data Reviewed: I have personally reviewed following labs and imaging studies  CBC: Recent Labs  Lab 05/07/19 0910 05/08/19 0455 05/09/19 0527  WBC 24.7* 20.0* 9.1  NEUTROABS 19.6*  --  6.0  HGB 12.6* 11.4* 11.6*  HCT 40.8 37.3* 37.9*  MCV 93.6 94.7 93.6  PLT 226 200 474   Basic Metabolic Panel: Recent Labs  Lab 05/07/19 0910 05/08/19  0455 05/09/19 0527  NA 142 142 140  K 4.3 4.3 3.9  CL 105 107 104  CO2 28 27 26   GLUCOSE 99 88 98  BUN 27* 24* 25*  CREATININE 1.17 1.07 1.12  CALCIUM 9.1 8.4* 7.6*  MG  --   --  1.9  PHOS  --   --  3.2   GFR: Estimated Creatinine Clearance: 61.2 mL/min (by C-G formula based on SCr of 1.12 mg/dL). Liver Function Tests: Recent Labs  Lab 05/07/19 0910 05/09/19 0527  AST 17 13*  ALT 14 15  ALKPHOS 77 80  BILITOT 0.6 0.7  PROT 6.7 6.0*  ALBUMIN 3.3* 2.7*   No results for input(s): LIPASE, AMYLASE in the last 168 hours. No results for input(s): AMMONIA in the last 168 hours. Coagulation Profile: No results for input(s): INR, PROTIME in the last  168 hours. Cardiac Enzymes: No results for input(s): CKTOTAL, CKMB, CKMBINDEX, TROPONINI in the last 168 hours. BNP (last 3 results) No results for input(s): PROBNP in the last 8760 hours. HbA1C: Recent Labs    05/07/19 2227  HGBA1C 6.7*   CBG: Recent Labs  Lab 05/08/19 0733 05/08/19 1127 05/08/19 1642 05/08/19 2045 05/09/19 0744  GLUCAP 77 102* 109* 131* 83   Lipid Profile: No results for input(s): CHOL, HDL, LDLCALC, TRIG, CHOLHDL, LDLDIRECT in the last 72 hours. Thyroid Function Tests: No results for input(s): TSH, T4TOTAL, FREET4, T3FREE, THYROIDAB in the last 72 hours. Anemia Panel: No results for input(s): VITAMINB12, FOLATE, FERRITIN, TIBC, IRON, RETICCTPCT in the last 72 hours. Sepsis Labs: No results for input(s): PROCALCITON, LATICACIDVEN in the last 168 hours.  Recent Results (from the past 240 hour(s))  Urine culture     Status: Abnormal   Collection Time: 05/07/19  1:24 PM   Specimen: Urine, Clean Catch  Result Value Ref Range Status   Specimen Description   Final    URINE, CLEAN CATCH Performed at [facility], [place] [address], [place], [place] [address]    Special Requests   Final    NONE Performed at [facility], [place] [address] [place], [place] [address]     Culture MULTIPLE SPECIES PRESENT, SUGGEST RECOLLECTION (A)  Final   Report Status 05/08/2019 FINAL  Final  SARS Coronavirus 2 [facility] order, Performed in [facility] hospital lab) Nasopharyngeal Nasopharyngeal Swab     Status: None   Collection Time: 05/07/19  1:54 PM   Specimen: Nasopharyngeal Swab  Result Value Ref Range Status   SARS Coronavirus 2 NEGATIVE NEGATIVE Final    Comment: (NOTE) If result is NEGATIVE SARS-CoV-2 target nucleic acids are NOT DETECTED. The SARS-CoV-2 RNA is generally detectable in upper and lower  respiratory specimens during the acute phase of infection. The lowest  concentration of SARS-CoV-2 viral copies this assay can detect is 250  copies / mL. A negative result does not preclude SARS-CoV-2 infection  and should not be used as the sole basis for treatment or other  patient management decisions.  A negative result may occur with  improper specimen collection / handling, submission of specimen other  than nasopharyngeal swab, presence of viral mutation(s) within the  areas targeted by this assay, and inadequate number of viral copies  (<250 copies / mL). A negative result must be combined with clinical  observations, patient history, and epidemiological information. If result is POSITIVE SARS-CoV-2 target nucleic acids are DETECTED. The SARS-CoV-2 RNA is generally detectable in upper and lower  respiratory specimens dur ing the acute phase of infection.  Positive  results are indicative of active infection with SARS-CoV-2.  Clinical  correlation with patient history and other diagnostic information is  necessary to determine patient infection status.  Positive results do  not rule out bacterial infection or co-infection with other viruses. If result is PRESUMPTIVE POSTIVE SARS-CoV-2 nucleic acids MAY BE PRESENT.   A presumptive positive result was obtained on the submitted specimen  and confirmed on repeat testing.  While 2019 novel coronavirus   (SARS-CoV-2) nucleic acids may be present in the submitted sample  additional confirmatory testing may be necessary for epidemiological  and / or clinical management purposes  to differentiate between  SARS-CoV-2 and other Sarbecovirus currently known to infect humans.  If clinically indicated additional testing with an alternate test  methodology [phone]) is advised. The SARS-CoV-2 RNA is generally  detectable  in upper and lower respiratory sp ecimens during the acute  phase of infection. The expected result is Negative. Fact Sheet for Patients:  StrictlyIdeas.no Fact Sheet for Healthcare Providers: BankingDealers.co.za This test is not yet approved or cleared by the [place] FDA and has been authorized for detection and/or diagnosis of SARS-CoV-2 by FDA under an Emergency Use Authorization (EUA).  This EUA will remain in effect (meaning this test can be used) for the duration of the COVID-19 declaration under Section 564(b)(1) of the Act, 21 U.S.C. section 360bbb-3(b)(1), unless the authorization is terminated or revoked sooner. Performed at [facility], [place] [address], [place], [place] [address]     RN Pressure Injury Documentation: Pressure Injury 07/20/16 Stage I -  Intact skin with non-blanchable redness of a localized area usually over a bony prominence. (Active)  07/20/16 1200  Location: Sacrum  Location Orientation: Medial  Staging: Stage I -  Intact skin with non-blanchable redness of a localized area usually over a bony prominence.  Wound Description (Comments):   Present on Admission:    Radiology Studies: Dg Chest 2 View  Result Date: 05/07/2019 CLINICAL DATA:  Chest pain. EXAM: CHEST - 2 VIEW COMPARISON:  Radiograph May 11, 2018. FINDINGS: The heart size and mediastinal contours are within normal limits. Both lungs are clear. No pneumothorax or pleural effusion is noted. The visualized  skeletal structures are unremarkable. IMPRESSION: No active cardiopulmonary disease. Electronically Signed   By: Marijo Conception M.D.   On: 05/07/2019 09:58   Vas [place] Lower Extremity Venous (dvt) (only Mc & Wl 7a-7p)  Result Date: 05/07/2019  Lower Venous Study Indications: Swelling.  Risk Factors: None identified. Limitations: Body habitus and poor ultrasound/tissue interface. Comparison Study: 09/17/2017 - Negative for DVT. Performing Technologist: Oliver Hum RVT  Examination Guidelines: A complete evaluation includes B-mode imaging, spectral Doppler, color Doppler, and power Doppler as needed of all accessible portions of each vessel. Bilateral testing is considered an integral part of a complete examination. Limited examinations for reoccurring indications may be performed as noted.  +---------+---------------+---------+-----------+----------+--------------+ RIGHT    CompressibilityPhasicitySpontaneityPropertiesThrombus Aging +---------+---------------+---------+-----------+----------+--------------+ CFV      Full           Yes      Yes                                 +---------+---------------+---------+-----------+----------+--------------+ SFJ      Full                                                        +---------+---------------+---------+-----------+----------+--------------+ FV Prox  Full                                                        +---------+---------------+---------+-----------+----------+--------------+ FV Mid   Full                                                        +---------+---------------+---------+-----------+----------+--------------+  FV DistalFull                                                        +---------+---------------+---------+-----------+----------+--------------+ PFV      Full                                                        +---------+---------------+---------+-----------+----------+--------------+ POP       Full           Yes      Yes                                 +---------+---------------+---------+-----------+----------+--------------+ PTV      Full                                                        +---------+---------------+---------+-----------+----------+--------------+ PERO     Full                                                        +---------+---------------+---------+-----------+----------+--------------+   +----+---------------+---------+-----------+----------+--------------+ LEFTCompressibilityPhasicitySpontaneityPropertiesThrombus Aging +----+---------------+---------+-----------+----------+--------------+ CFV Full           Yes      Yes                                 +----+---------------+---------+-----------+----------+--------------+     Summary: Right: There is no evidence of deep vein thrombosis in the lower extremity. No cystic structure found in the popliteal fossa. Left: No evidence of common femoral vein obstruction.  *See table(s) above for measurements and observations. Electronically signed by Servando Snare MD on 05/07/2019 at 1:09:57 PM.    Final    Scheduled Meds: . carvedilol  3.125 mg Oral BID  . docusate sodium  100 mg Oral BID  . enoxaparin (LOVENOX) injection  40 mg Subcutaneous Q24H  . galantamine  16 mg Oral Q breakfast  . guaiFENesin  1,200 mg Oral BID  . insulin aspart  0-5 Units Subcutaneous QHS  . insulin aspart  0-9 Units Subcutaneous TID WC  . insulin glargine  10 Units Subcutaneous QHS  . isosorbide mononitrate  30 mg Oral Daily  . lisinopril  20 mg Oral Daily  . Melatonin  5 mg Oral QHS  . methocarbamol  750 mg Oral Q8H  . pravastatin  80 mg Oral QHS  . QUEtiapine  50 mg Oral QHS  . torsemide  20 mg Oral BID  . traZODone  50 mg Oral QHS   Continuous Infusions: . sodium chloride 1,000 mL (05/09/19 0308)  . cefTRIAXone (ROCEPHIN)  IV Stopped (05/08/19 1345)    LOS: 2 days   Kerney Elbe, DO [facility] PAGER is on [employer]  If 7PM-7AM, please contact night-coverage [email or web address] Password [facility] 05/09/2019, 8:32 AM

## 2019-05-09 NOTE — Progress Notes (Signed)
Patient declines the use of nocturnal CPAP for this admission. He states he has one at home and does not use it because he "feels fine". Education provided. Patient responded with "I aint using that thing. It'll scare my dog". Order changed to prn. He is aware that if he should change his mind, he may request for assistance at anytime.

## 2019-05-10 DIAGNOSIS — R609 Edema, unspecified: Secondary | ICD-10-CM | POA: Diagnosis not present

## 2019-05-10 DIAGNOSIS — F0390 Unspecified dementia without behavioral disturbance: Secondary | ICD-10-CM | POA: Diagnosis not present

## 2019-05-10 DIAGNOSIS — I129 Hypertensive chronic kidney disease with stage 1 through stage 4 chronic kidney disease, or unspecified chronic kidney disease: Secondary | ICD-10-CM | POA: Diagnosis not present

## 2019-05-10 DIAGNOSIS — G894 Chronic pain syndrome: Secondary | ICD-10-CM | POA: Diagnosis not present

## 2019-05-10 DIAGNOSIS — E871 Hypo-osmolality and hyponatremia: Secondary | ICD-10-CM

## 2019-05-10 DIAGNOSIS — N390 Urinary tract infection, site not specified: Secondary | ICD-10-CM | POA: Diagnosis not present

## 2019-05-10 DIAGNOSIS — K219 Gastro-esophageal reflux disease without esophagitis: Secondary | ICD-10-CM | POA: Diagnosis not present

## 2019-05-10 DIAGNOSIS — R41841 Cognitive communication deficit: Secondary | ICD-10-CM | POA: Diagnosis not present

## 2019-05-10 DIAGNOSIS — Z9181 History of falling: Secondary | ICD-10-CM | POA: Diagnosis not present

## 2019-05-10 DIAGNOSIS — M2550 Pain in unspecified joint: Secondary | ICD-10-CM | POA: Diagnosis not present

## 2019-05-10 DIAGNOSIS — R600 Localized edema: Secondary | ICD-10-CM | POA: Diagnosis not present

## 2019-05-10 DIAGNOSIS — G8929 Other chronic pain: Secondary | ICD-10-CM | POA: Diagnosis not present

## 2019-05-10 DIAGNOSIS — I270 Primary pulmonary hypertension: Secondary | ICD-10-CM | POA: Diagnosis not present

## 2019-05-10 DIAGNOSIS — E119 Type 2 diabetes mellitus without complications: Secondary | ICD-10-CM | POA: Diagnosis not present

## 2019-05-10 DIAGNOSIS — U071 COVID-19: Secondary | ICD-10-CM | POA: Diagnosis not present

## 2019-05-10 DIAGNOSIS — E1121 Type 2 diabetes mellitus with diabetic nephropathy: Secondary | ICD-10-CM | POA: Diagnosis not present

## 2019-05-10 DIAGNOSIS — N3000 Acute cystitis without hematuria: Secondary | ICD-10-CM | POA: Diagnosis not present

## 2019-05-10 DIAGNOSIS — Z23 Encounter for immunization: Secondary | ICD-10-CM | POA: Diagnosis not present

## 2019-05-10 DIAGNOSIS — D649 Anemia, unspecified: Secondary | ICD-10-CM | POA: Diagnosis not present

## 2019-05-10 DIAGNOSIS — G4733 Obstructive sleep apnea (adult) (pediatric): Secondary | ICD-10-CM | POA: Diagnosis not present

## 2019-05-10 DIAGNOSIS — R5381 Other malaise: Secondary | ICD-10-CM | POA: Diagnosis not present

## 2019-05-10 DIAGNOSIS — M549 Dorsalgia, unspecified: Secondary | ICD-10-CM | POA: Diagnosis not present

## 2019-05-10 DIAGNOSIS — E669 Obesity, unspecified: Secondary | ICD-10-CM | POA: Diagnosis not present

## 2019-05-10 DIAGNOSIS — F4312 Post-traumatic stress disorder, chronic: Secondary | ICD-10-CM | POA: Diagnosis not present

## 2019-05-10 DIAGNOSIS — N183 Chronic kidney disease, stage 3 unspecified: Secondary | ICD-10-CM | POA: Diagnosis not present

## 2019-05-10 DIAGNOSIS — Z993 Dependence on wheelchair: Secondary | ICD-10-CM | POA: Diagnosis not present

## 2019-05-10 DIAGNOSIS — K5900 Constipation, unspecified: Secondary | ICD-10-CM | POA: Diagnosis not present

## 2019-05-10 DIAGNOSIS — R899 Unspecified abnormal finding in specimens from other organs, systems and tissues: Secondary | ICD-10-CM | POA: Diagnosis not present

## 2019-05-10 DIAGNOSIS — M5410 Radiculopathy, site unspecified: Secondary | ICD-10-CM | POA: Diagnosis not present

## 2019-05-10 DIAGNOSIS — Z794 Long term (current) use of insulin: Secondary | ICD-10-CM | POA: Diagnosis not present

## 2019-05-10 DIAGNOSIS — Z7401 Bed confinement status: Secondary | ICD-10-CM | POA: Diagnosis not present

## 2019-05-10 DIAGNOSIS — M6281 Muscle weakness (generalized): Secondary | ICD-10-CM | POA: Diagnosis not present

## 2019-05-10 DIAGNOSIS — N179 Acute kidney failure, unspecified: Secondary | ICD-10-CM | POA: Diagnosis not present

## 2019-05-10 DIAGNOSIS — R531 Weakness: Secondary | ICD-10-CM | POA: Diagnosis not present

## 2019-05-10 DIAGNOSIS — M1990 Unspecified osteoarthritis, unspecified site: Secondary | ICD-10-CM | POA: Diagnosis not present

## 2019-05-10 DIAGNOSIS — E785 Hyperlipidemia, unspecified: Secondary | ICD-10-CM | POA: Diagnosis not present

## 2019-05-10 DIAGNOSIS — R278 Other lack of coordination: Secondary | ICD-10-CM | POA: Diagnosis not present

## 2019-05-10 LAB — CBC WITH DIFFERENTIAL/PLATELET
Abs Immature Granulocytes: 0.02 K/uL (ref 0.00–0.07)
Basophils Absolute: 0 K/uL (ref 0.0–0.1)
Basophils Relative: 0 %
Eosinophils Absolute: 0.3 K/uL (ref 0.0–0.5)
Eosinophils Relative: 5 %
HCT: 37.8 % — ABNORMAL LOW (ref 39.0–52.0)
Hemoglobin: 11.6 g/dL — ABNORMAL LOW (ref 13.0–17.0)
Immature Granulocytes: 0 %
Lymphocytes Relative: 28 %
Lymphs Abs: 2 K/uL (ref 0.7–4.0)
MCH: 28.6 pg (ref 26.0–34.0)
MCHC: 30.7 g/dL (ref 30.0–36.0)
MCV: 93.1 fL (ref 80.0–100.0)
Monocytes Absolute: 1.2 K/uL — ABNORMAL HIGH (ref 0.1–1.0)
Monocytes Relative: 17 %
Neutro Abs: 3.6 K/uL (ref 1.7–7.7)
Neutrophils Relative %: 50 %
Platelets: 230 K/uL (ref 150–400)
RBC: 4.06 MIL/uL — ABNORMAL LOW (ref 4.22–5.81)
RDW: 13.9 % (ref 11.5–15.5)
WBC: 7.2 K/uL (ref 4.0–10.5)
nRBC: 0 % (ref 0.0–0.2)

## 2019-05-10 LAB — URINE CULTURE: Culture: <10000 — AB

## 2019-05-10 LAB — GLUCOSE, CAPILLARY
Glucose-Capillary: 116 mg/dL — ABNORMAL HIGH (ref 70–99)
Glucose-Capillary: 129 mg/dL — ABNORMAL HIGH (ref 70–99)

## 2019-05-10 LAB — COMPREHENSIVE METABOLIC PANEL
ALT: 14 U/L (ref 0–44)
AST: 14 U/L — ABNORMAL LOW (ref 15–41)
Albumin: 2.7 g/dL — ABNORMAL LOW (ref 3.5–5.0)
Alkaline Phosphatase: 77 U/L (ref 38–126)
Anion gap: 12 (ref 5–15)
BUN: 29 mg/dL — ABNORMAL HIGH (ref 8–23)
CO2: 28 mmol/L (ref 22–32)
Calcium: 8.4 mg/dL — ABNORMAL LOW (ref 8.9–10.3)
Chloride: 101 mmol/L (ref 98–111)
Creatinine, Ser: 1.11 mg/dL (ref 0.61–1.24)
GFR calc Af Amer: >60 mL/min (ref >60)
GFR calc non Af Amer: >60 mL/min (ref >60)
Glucose, Bld: 154 mg/dL — ABNORMAL HIGH (ref 70–99)
Potassium: 3.9 mmol/L (ref 3.5–5.1)
Sodium: 141 mmol/L (ref 135–145)
Total Bilirubin: 0.4 mg/dL (ref 0.3–1.2)
Total Protein: 5.9 g/dL — ABNORMAL LOW (ref 6.5–8.1)

## 2019-05-10 LAB — MAGNESIUM: Magnesium: 2 mg/dL (ref 1.7–2.4)

## 2019-05-10 LAB — PHOSPHORUS: Phosphorus: 3.3 mg/dL (ref 2.5–4.6)

## 2019-05-10 MED ORDER — GUAIFENESIN ER 600 MG PO TB12: 1200.0000 mg | ORAL_TABLET | Freq: Two times a day (BID) | ORAL | 0 refills | Status: AC

## 2019-05-10 MED ORDER — ALBUTEROL SULFATE (2.5 MG/3ML) 0.083% IN NEBU: 2.5000 mg | INHALATION_SOLUTION | RESPIRATORY_TRACT | 12 refills | Status: AC | PRN

## 2019-05-10 MED ORDER — CEFDINIR 300 MG PO CAPS: 300.0000 mg | ORAL_CAPSULE | Freq: Two times a day (BID) | ORAL | 0 refills | Status: AC

## 2019-05-10 MED ORDER — POLYSACCHARIDE IRON COMPLEX 150 MG PO CAPS: 150.0000 mg | ORAL_CAPSULE | Freq: Every day | ORAL | Status: AC

## 2019-05-10 MED ORDER — SENNOSIDES-DOCUSATE SODIUM 8.6-50 MG PO TABS: 1.0000 | ORAL_TABLET | Freq: Every evening | ORAL | Status: DC | PRN

## 2019-05-10 MED ORDER — OXYCODONE HCL 5 MG PO TABS: 5.0000 mg | ORAL_TABLET | ORAL | 0 refills | Status: DC | PRN

## 2019-05-10 MED ORDER — ONDANSETRON HCL 4 MG PO TABS: 4.0000 mg | ORAL_TABLET | Freq: Four times a day (QID) | ORAL | 0 refills | Status: DC | PRN

## 2019-05-10 MED ORDER — BISACODYL 10 MG RE SUPP: 10.0000 mg | Freq: Every day | RECTAL | 0 refills | Status: DC | PRN

## 2019-05-10 MED ORDER — CEFDINIR 300 MG PO CAPS: 300.0000 mg | ORAL_CAPSULE | Freq: Two times a day (BID) | ORAL | Status: DC

## 2019-05-10 NOTE — Care Management Important Message (Signed)
Important Message  Patient Details IM Letter given to Sharren Bridge SW to present to the Patient Name: [name] MRN: [id number] Date of Birth: [birth year]   Medicare Important Message Given:  Yes     Kerin Salen 05/10/2019, 10:46 AM

## 2019-05-10 NOTE — TOC Transition Note (Signed)
Transition of Care [facility]) - CM/SW Discharge Note   Patient Details  Name: [name] MRN: [id number] Date of Birth: [birth year]  Transition of Care [facility]) CM/SW Contact:  Nila Nephew, LCSW Phone Number: [phone] 05/10/2019, 1:59 PM   Clinical Narrative:   Pt selected [facility]), admitting today to room 36, report [phone]  Pt informed his wife and daughter in law. Arranged PTAR transportation.       Barriers to Discharge: resolved  Patient Goals and CMS Choice Patient states their goals for this hospitalization and ongoing recovery are:: get good care CMS Medicare.gov Compare Post Acute Care list provided to:: Other (Comment Required)(wife)    Discharge Placement                       Discharge Plan and Services In-house Referral: Clinical Social Work   Post Acute Care Choice: [place]                               Social Determinants of Health (SDOH) Interventions     Readmission Risk Interventions No flowsheet data found.

## 2019-05-10 NOTE — Discharge Summary (Signed)
Physician Discharge Summary  [name] WHQ:[id number] DOB: [birth year] DOA: 05/07/2019  PCP: [name], MD  Admit date: 05/07/2019 Discharge date: 05/10/2019  Admitted From: Home Disposition: SNF  Recommendations for Outpatient Follow-up:  1. Follow up with PCP in 1-2 weeks 2. Please obtain CMP/CBC, Mag, Phos in one week 3. Please follow up on the following pending results:  Home Health: No  Equipment/Devices: None recommended by PT/OT  Discharge Condition: Stable CODE STATUS: FULL CODE  Diet recommendation: Heart Healthy Carb Modified Diet   Brief/Interim Summary: HPI per Dr. Wendee [name] on 05/07/2019 [name] Melvinis a 85 y.o.malewith history ofIDDM-2, depression, hyponatremia,right lower extremity edema,arthritis, multiple back surgeries (6 times), radiculopathyandwheelchair-bound presenting with acute on chronic weakness and UTI symptoms.  History obtained from patient and patient's wife at bedside.  Patient reports worsening weakness and frequent falls that he describes as "sliding down". Reports about 5 falls in the last 1 month.He denies hitting his head or loss of consciousness. He also reports dysuria but denies urgency or frequency. He denies fever but admits to chills. He denies chest pain, dyspnea, orthopnea, PND, nausea, vomiting, abdominal pain or other acute changes to his back pain. He admits to constipation. He states last bowel movement was 2 days ago. He attributes this to poor p.o. intake. He endorses "head cold".He also reports dry cough which is chronic for him.   Lives with his wife. He is wheelchair-bound. He has motorized wheelchair. He goes to PT at [place]. He has home health aide twice a week. He denies smoking cigarettes, drinking alcohol recreational drug use.  In ED, febrile to 100 (geriatric). HR, RR, BP and O2 within normal on arrival butgotten tachypneic to 20s.CMP not impressive. WBC 25. Hgb 12.6. Otherwise CBC  not impressive. COVID-19 negative. CXR without acute finding. Lower extremity ultrasound negative for DVT. UA with moderate LAE, 5 ketones and few bacteria otherwise not impressive. Urine culture sent. Started on ceftriaxone and hospitalist service was called for admission for UTI and recurrent falls.   **Interim History Patient still continued to be weak and was complaining of having a "head cold" the day before yesterday.  Also complained of having some discomfort in his urine but this is improving.  PT OT recommending SNF versus home health PT and will repeat urine culture as urine culture showed multiple species present.  Patient's leukocytosis is now resolved and hemoglobin/hematocrit remained stable.  Unfortunately no organism was grown in his urine culture but will empirically treat with cephalosporins as he is tolerated this and seems to be improving and changed IV ceftriaxone to p.o. cefdinir for 5-day total antibiotic treatment.  Patient improved and was deemed stable to be discharged as he is now afebrile and white count has now improved.  He has a bed at skilled nursing facility and will need to follow-up with PCP and continue strength training at the facility.  Discharge Diagnoses:  Active Problems:   UTI (urinary tract infection)  Acute urinary tract infection:patient with dysuria, fever and leukocytosis, improving -Urinalysis done and showed hazy appearance with 5 ketones, moderate leukocytes, few bacteria, greater than 50 WBCs, 0-5 squamous epithelial cells -Urine culture done and showed multiple species present and recommend recollection so we will reorder a urine culture; repeat urine cultures pending -Patient was complaining of burning and discomfort on urination -Continue IV Ceftriaxone for now until sensitivities are resulted but may not be able to figure out what organism the patient is growing; Will change to po Omnicef for completion  of 5 Days Abx course -Repeat urine  culture now shows less than 10,000 CFU of Insignificant Growth -LeukoCytosis is improved significantly and WBC went from 24.7 is now 7.2 -Repeat CBC at SNF   Recurrent fall at home: patient is wheelchair-bound at baseline.  -Has outpatient PT at [facility]. Has [place] aide at home. Reports worsening weakness to the extent of difficulty transferring. -PT/OT eval recommending SNF vs Home Health PT vs [facility]; (OT though just recommends SNF with 24 Hour Supervision/Assistance -Patient is agreeable to going to a skilled nursing facility so I have consulted social work for further assistance but patient does not want to go back to the previous skilled nursing facility that he has been at; Patient now accepted to SNF and will D/c Today  -C/w Fall Precautions  Arthritis/chronic back pain/radiculopathy/lower extremity weakness -Currently No red flags. Motor weakness in right leg which is chronic. -Pain control and therapy as above -C/w oxycodone 5 mg p.o. every 4 hours PRN for moderate pain along with methocarbamol 750 mg p.o. every 8 hours and will continue at D/C -Currently on meloxicam 7.5 mg p.o. twice daily but will hold for now  IDDM-2 Complicated by nephropathy and Neuropathy  -Last A1c 7.3 in 1/20. Euglycemic. -Continue home Lantus at 10 units nightly -Continue with sensitive NovoLog sliding scale insulin before meals and at bedtime -CBGs are currently ranging from 116-143 -We will continue to monitor and treat trend blood sugars and adjust insulin as necessary -Repeat HbA1c this visit was 6.7  HLD -C/w Pravastatin 80 mg p.o. nightly  Lower extremity edema,right greater than the left-chronic -Ultrasound negative for DVT -Continue home Torsemide 20 mg po Daily  Hypertension -Currently Normotensive this morning was 127/75 -Continue home medications with Carvedilol 3.125 mg p.o. twice daily along with Lisinopril 20 mg p.o. daily and Torsemide 20 mg po Daily   Constipation likely  secondary to opiates -Last bowel movement was approximately 3 days ago -Bowel regimen initiated and will continue and continue senna docusate 1 tab p.o. nightly as needed for mild constipation, unclear Amao rectally once PRN for severe constipation as well as milligrams p.o. twice daily -Also continue bisacodyl 10 mg rectally daily PRN for moderate constipation -May need to adjust as necessary -C/w Bowel Regimen at SNF  PTSD/Depression/Dementia -C/w Galantamine 16 mg po Daily, Quetiapime 50 mg po qHS, and Trazodone 50 mg po qHS   GERD/Hx of Gastric Perforation and history of duodenal ulcer/history of Helicobacter pylori gastritis -We will hold Meloxicam while hospitalized and discontinue off of MAR for now  -Currently does not appear to be on any PPI based on home medication reconciliation -Currently not complaining of any reflux symptoms  Hx of PE -Currently not on anticoagulation  OSA and history of Pulmonary Hypertension -We will order CPAP but unsure of compliance  Normocytic Anemia -The patient's hemoglobin/hematocrit went from 12.6/40.8 -> 11.4/37.3 -> 11.6/37.9 -> 11.6/37.8 -Checked Anemia Panel and showed an iron level of 26, TIBC of 226, TIBC 252, saturation ratio 10%, ferritin level 202, folate level 11.3, and vitamin B12 level 720 -patient may benefit from IV iron transfusion; we will currently start the patient on Niferex 150 mg po Daily  -Continue to monitor for signs and symptoms of bleeding; currently no overt bleeding noted -Repeat CBC in a.m.  Obesity -Estimated body mass index is 34.31 kg/m as calculated from the following:   Height as of this encounter: '5\' 11"'  (1.803 m).   Weight as of this encounter: 111.6 kg. -Weight Loss and Dietary  Counseling given  Discharge Instructions Discharge Instructions    (HEART FAILURE PATIENTS) Call MD:  Anytime you have any of the following symptoms: 1) 3 pound weight gain in 24 hours or 5 pounds in 1 week 2) shortness of  breath, with or without a dry hacking cough 3) swelling in the hands, feet or stomach 4) if you have to sleep on extra pillows at night in order to breathe.   Complete by: As directed    Call MD for:  difficulty breathing, headache or visual disturbances   Complete by: As directed    Call MD for:  extreme fatigue   Complete by: As directed    Call MD for:  hives   Complete by: As directed    Call MD for:  persistant dizziness or light-headedness   Complete by: As directed    Call MD for:  persistant nausea and vomiting   Complete by: As directed    Call MD for:  redness, tenderness, or signs of infection (pain, swelling, redness, odor or green/yellow discharge around incision site)   Complete by: As directed    Call MD for:  severe uncontrolled pain   Complete by: As directed    Call MD for:  temperature >100.4   Complete by: As directed    Diet - low sodium heart healthy   Complete by: As directed    Diet Carb Modified   Complete by: As directed    Discharge instructions   Complete by: As directed    You were cared for by a hospitalist during your hospital stay. If you have any questions about your discharge medications or the care you received while you were in the hospital after you are discharged, you can call the unit and ask to speak with the hospitalist on call if the hospitalist that took care of you is not available. Once you are discharged, your primary care physician will handle any further medical issues. Please note that NO REFILLS for any discharge medications will be authorized once you are discharged, as it is imperative that you return to your primary care physician (or establish a relationship with a primary care physician if you do not have one) for your aftercare needs so that they can reassess your need for medications and monitor your lab values.  Follow up with PCP within 1 week. Take all medications as prescribed. If symptoms change or worsen please return to the ED  for evaluation   Increase activity slowly   Complete by: As directed      Allergies as of 05/10/2019      Reactions   Oxycontin [oxycodone Hcl] Other (See Comments)   Pt states he cant sleep w/ this med; also makes him constipated.       Medication List    STOP taking these medications   meloxicam 7.5 MG tablet Commonly known as: MOBIC     TAKE these medications   Accu-Chek Aviva Plus test strip Generic drug: glucose blood Use one strip to test sugars. Pt checks sugars 4 times daily. Dx. E11.9   Accu-Chek Aviva Plus w/Device Kit Use glucometer to test sugars 4 times daily. Dx. E11.9   acetaminophen 500 MG tablet Commonly known as: TYLENOL Take 500 mg by mouth every 8 (eight) hours as needed for mild pain.   albuterol (2.5 MG/3ML) 0.083% nebulizer solution Commonly known as: PROVENTIL Take 3 mLs (2.5 mg total) by nebulization every 2 (two) hours as needed for wheezing.   ascorbic  acid 500 MG tablet Commonly known as: VITAMIN C Take 1,000 mg by mouth daily.   bisacodyl 10 MG suppository Commonly known as: DULCOLAX Place 1 suppository (10 mg total) rectally daily as needed for moderate constipation.   calcium-vitamin D 500-200 MG-UNIT tablet Commonly known as: OSCAL WITH D Take 1 tablet by mouth.   carvedilol 3.125 MG tablet Commonly known as: COREG TAKE 1 TABLET TWICE A DAY WITH MEALS What changed: when to take this   cefdinir 300 MG capsule Commonly known as: OMNICEF Take 1 capsule (300 mg total) by mouth every 12 (twelve) hours for 2 days. Start taking on: May 11, 2019   diclofenac sodium 1 % Gel Commonly known as: VOLTAREN Apply 2 g topically 3 (three) times daily.   docusate sodium 100 MG capsule Commonly known as: COLACE Take 1 capsule (100 mg total) by mouth 2 (two) times daily.   freestyle lancets Use as instructed to check sugars 4 times daily. Dx E11.9   galantamine 16 MG 24 hr capsule Commonly known as: RAZADYNE ER Take 16 mg by mouth  daily with breakfast.   guaiFENesin 600 MG 12 hr tablet Commonly known as: MUCINEX Take 2 tablets (1,200 mg total) by mouth 2 (two) times daily for 20 doses.   HumaLOG 100 UNIT/ML injection Generic drug: insulin lispro Inject 0-5 Units into the skin 3 (three) times daily before meals. 0-200 = 0 units, 201-250 = 2 units, 251-300 = 3 units, 301-350 = 4 units, 351-400 = 5 units, >401 call MD/NP   insulin glargine 100 UNIT/ML injection Commonly known as: LANTUS 10 units nightly   iron polysaccharides 150 MG capsule Commonly known as: NIFEREX Take 1 capsule (150 mg total) by mouth daily. Start taking on: May 11, 2019   isosorbide mononitrate 30 MG 24 hr tablet Commonly known as: IMDUR Take 30 mg by mouth daily.   lactose free nutrition Liqd Take 237 mLs by mouth daily as needed (nutrition).   lisinopril 20 MG tablet Commonly known as: ZESTRIL Take 20 mg by mouth daily.   Magnesium Oxide 420 (252 Mg) MG Tabs Take 420 mg by mouth 2 (two) times daily.   Melatonin 5 MG Tabs Take 5 mg by mouth at bedtime.   methocarbamol 750 MG tablet Commonly known as: ROBAXIN Take 1 tablet (750 mg total) by mouth every 6 (six) hours as needed for muscle spasms. What changed:   when to take this  additional instructions   ondansetron 4 MG tablet Commonly known as: ZOFRAN Take 1 tablet (4 mg total) by mouth every 6 (six) hours as needed for nausea.   oxyCODONE 5 MG immediate release tablet Commonly known as: Oxy IR/ROXICODONE Take 1 tablet (5 mg total) by mouth every 4 (four) hours as needed for moderate pain.   polyethylene glycol 17 g packet Commonly known as: MIRALAX / GLYCOLAX Take 17 g by mouth daily as needed for mild constipation.   pravastatin 80 MG tablet Commonly known as: PRAVACHOL Take 80 mg by mouth at bedtime.   PRESERVISION AREDS 2 PO Take 1 capsule by mouth 2 (two) times daily.   QUEtiapine 50 MG tablet Commonly known as: SEROQUEL Take 50 mg by mouth at  bedtime.   senna-docusate 8.6-50 MG tablet Commonly known as: Senokot-S Take 1 tablet by mouth at bedtime as needed for mild constipation.   Systane Balance 0.6 % Soln Generic drug: Propylene Glycol Place 1 drop into both eyes daily as needed (dry eyes).   Theratears 0.25 %  Soln Generic drug: Carboxymethylcellulose Sodium Place 1 drop into both eyes See admin instructions. Instill 2-4 times daily   torsemide 20 MG tablet Commonly known as: DEMADEX Take 1 tablet (20 mg total) by mouth 2 (two) times daily.   traZODone 50 MG tablet Commonly known as: DESYREL Take 50 mg by mouth at bedtime.   vitamin B-12 500 MCG tablet Commonly known as: CYANOCOBALAMIN Take 500 mcg by mouth 2 (two) times daily.   Vitamin D3 50 MCG (2000 UT) capsule Take 2,000 Units by mouth 2 (two) times daily.       Allergies  Allergen Reactions  . Oxycontin [Oxycodone Hcl] Other (See Comments)    Pt states he cant sleep w/ this med; also makes him constipated.     Consultations:  None  Procedures/Studies: Dg Chest 2 View  Result Date: 05/07/2019 CLINICAL DATA:  Chest pain. EXAM: CHEST - 2 VIEW COMPARISON:  Radiograph May 11, 2018. FINDINGS: The heart size and mediastinal contours are within normal limits. Both lungs are clear. No pneumothorax or pleural effusion is noted. The visualized skeletal structures are unremarkable. IMPRESSION: No active cardiopulmonary disease. Electronically Signed   By: Marijo Conception M.D.   On: 05/07/2019 09:58   Vas [place] Lower Extremity Venous (dvt) (only Mc & Wl 7a-7p)  Result Date: 05/07/2019  Lower Venous Study Indications: Swelling.  Risk Factors: None identified. Limitations: Body habitus and poor ultrasound/tissue interface. Comparison Study: 09/17/2017 - Negative for DVT. Performing Technologist: Oliver Hum RVT  Examination Guidelines: A complete evaluation includes B-mode imaging, spectral Doppler, color Doppler, and power Doppler as needed of all accessible  portions of each vessel. Bilateral testing is considered an integral part of a complete examination. Limited examinations for reoccurring indications may be performed as noted.  +---------+---------------+---------+-----------+----------+--------------+ RIGHT    CompressibilityPhasicitySpontaneityPropertiesThrombus Aging +---------+---------------+---------+-----------+----------+--------------+ CFV      Full           Yes      Yes                                 +---------+---------------+---------+-----------+----------+--------------+ SFJ      Full                                                        +---------+---------------+---------+-----------+----------+--------------+ FV Prox  Full                                                        +---------+---------------+---------+-----------+----------+--------------+ FV Mid   Full                                                        +---------+---------------+---------+-----------+----------+--------------+ FV DistalFull                                                        +---------+---------------+---------+-----------+----------+--------------+  PFV      Full                                                        +---------+---------------+---------+-----------+----------+--------------+ POP      Full           Yes      Yes                                 +---------+---------------+---------+-----------+----------+--------------+ PTV      Full                                                        +---------+---------------+---------+-----------+----------+--------------+ PERO     Full                                                        +---------+---------------+---------+-----------+----------+--------------+   +----+---------------+---------+-----------+----------+--------------+ LEFTCompressibilityPhasicitySpontaneityPropertiesThrombus Aging  +----+---------------+---------+-----------+----------+--------------+ CFV Full           Yes      Yes                                 +----+---------------+---------+-----------+----------+--------------+     Summary: Right: There is no evidence of deep vein thrombosis in the lower extremity. No cystic structure found in the popliteal fossa. Left: No evidence of common femoral vein obstruction.  *See table(s) above for measurements and observations. Electronically signed by Servando Snare MD on 05/07/2019 at 1:09:57 PM.    Final     Subjective: Seen and examined at bedside patient was feeling better denies any complaints and basically had all been stronger but still feels the need for rehab as he still remains weaker than his baseline.  No chest pain, lightheadedness or dizziness.  No other concerns about this time but was not happy with the diet and requested a diet change to a regular diet.  No other concerns or questions this time and is deemed stable for discharging to a skilled nursing facility for continued rehab.  Discharge Exam: Vitals:   05/09/19 1938 05/10/19 0522  BP: 127/72 127/75  Pulse: 79 79  Resp: 17 18  Temp: 98.6 F (37 C) 98.5 F (36.9 C)  SpO2: 94% 93%   Vitals:   05/09/19 0534 05/09/19 1352 05/09/19 1938 05/10/19 0522  BP: 127/64 123/61 127/72 127/75  Pulse: 76 75 79 79  Resp: '18 18 17 18  ' Temp: 98.2 F (36.8 C) 98.3 F (36.8 C) 98.6 F (37 C) 98.5 F (36.9 C)  TempSrc: Oral  Oral   SpO2: 97% 95% 94% 93%  Weight:      Height:       General: Pt is alert, awake, not in acute distress Cardiovascular: RRR, S1/S2 +, no rubs, no gallops Respiratory: Diminished bilaterally, no wheezing, no rhonchi; Unlabored Breathing Abdominal: Soft, NT, Distended due to body habitus, bowel sounds + Extremities:  Trace edema, no cyanosis  The results of significant diagnostics from this hospitalization (including imaging, microbiology, ancillary and laboratory) are listed below  for reference.    Microbiology: Recent Results (from the past 240 hour(s))  Urine culture     Status: Abnormal   Collection Time: 05/07/19  1:24 PM   Specimen: Urine, Clean Catch  Result Value Ref Range Status   Specimen Description   Final    URINE, CLEAN CATCH Performed at [facility], [place] [address] [place], [place] [address]    Special Requests   Final    NONE Performed at [facility], [place] [address] [place], [place] [address]    Culture MULTIPLE SPECIES PRESENT, SUGGEST RECOLLECTION (A)  Final   Report Status 05/08/2019 FINAL  Final  SARS Coronavirus 2 [facility] order, Performed in [facility] hospital lab) Nasopharyngeal Nasopharyngeal Swab     Status: None   Collection Time: 05/07/19  1:54 PM   Specimen: Nasopharyngeal Swab  Result Value Ref Range Status   SARS Coronavirus 2 NEGATIVE NEGATIVE Final    Comment: (NOTE) If result is NEGATIVE SARS-CoV-2 target nucleic acids are NOT DETECTED. The SARS-CoV-2 RNA is generally detectable in upper and lower  respiratory specimens during the acute phase of infection. The lowest  concentration of SARS-CoV-2 viral copies this assay can detect is 250  copies / mL. A negative result does not preclude SARS-CoV-2 infection  and should not be used as the sole basis for treatment or other  patient management decisions.  A negative result may occur with  improper specimen collection / handling, submission of specimen other  than nasopharyngeal swab, presence of viral mutation(s) within the  areas targeted by this assay, and inadequate number of viral copies  (<250 copies / mL). A negative result must be combined with clinical  observations, patient history, and epidemiological information. If result is POSITIVE SARS-CoV-2 target nucleic acids are DETECTED. The SARS-CoV-2 RNA is generally detectable in upper and lower  respiratory specimens dur ing the acute phase of infection.  Positive  results  are indicative of active infection with SARS-CoV-2.  Clinical  correlation with patient history and other diagnostic information is  necessary to determine patient infection status.  Positive results do  not rule out bacterial infection or co-infection with other viruses. If result is PRESUMPTIVE POSTIVE SARS-CoV-2 nucleic acids MAY BE PRESENT.   A presumptive positive result was obtained on the submitted specimen  and confirmed on repeat testing.  While 2019 novel coronavirus  (SARS-CoV-2) nucleic acids may be present in the submitted sample  additional confirmatory testing may be necessary for epidemiological  and / or clinical management purposes  to differentiate between  SARS-CoV-2 and other Sarbecovirus currently known to infect humans.  If clinically indicated additional testing with an alternate test  methodology [phone]) is advised. The SARS-CoV-2 RNA is generally  detectable in upper and lower respiratory sp ecimens during the acute  phase of infection. The expected result is Negative. Fact Sheet for Patients:  StrictlyIdeas.no Fact Sheet for Healthcare Providers: BankingDealers.co.za This test is not yet approved or cleared by the [place] FDA and has been authorized for detection and/or diagnosis of SARS-CoV-2 by FDA under an Emergency Use Authorization (EUA).  This EUA will remain in effect (meaning this test can be used) for the duration of the COVID-19 declaration under Section 564(b)(1) of the Act, 21 U.S.C. section 360bbb-3(b)(1), unless the authorization is terminated or revoked sooner. Performed at [facility], [place] [address] [place], [place] [address]   Culture, Urine     Status: Abnormal   Collection Time: 05/08/19  4:25 PM   Specimen: Urine, Random  Result Value Ref Range Status   Specimen Description   Final    URINE, RANDOM Performed at [place] [address], [place], [place] [address]    Special Requests   Final    NONE Performed at [facility], [place] [address], [place], [place] [address]    Culture (A)  Final    <10,000 COLONIES/mL INSIGNIFICANT GROWTH Performed at [place] [address], [place],  [address]    Report Status 05/10/2019 FINAL  Final    Labs: BNP (last 3 results) No results for input(s): BNP in the last 8760 hours. Basic Metabolic Panel: Recent Labs  Lab 05/07/19 0910 05/08/19 0455 05/09/19 0527 05/10/19 0518  NA 142 142 140 141  K 4.3 4.3 3.9 3.9  CL 105 107 104 101  CO2 '28 27 26 28  ' GLUCOSE 99 88 98 154*  BUN 27* 24* 25* 29*  CREATININE 1.17 1.07 1.12 1.11  CALCIUM 9.1 8.4* 7.6* 8.4*  MG  --   --  1.9 2.0  PHOS  --   --  3.2 3.3   Liver Function Tests: Recent Labs  Lab 05/07/19 0910 05/09/19 0527 05/10/19 0518  AST 17 13* 14*  ALT '14 15 14  ' ALKPHOS 77 80 77  BILITOT 0.6 0.7 0.4  PROT 6.7 6.0* 5.9*  ALBUMIN 3.3* 2.7* 2.7*   No results for input(s): LIPASE, AMYLASE in the last 168 hours. No results for input(s): AMMONIA in the last 168 hours. CBC: Recent Labs  Lab 05/07/19 0910 05/08/19 0455 05/09/19 0527 05/10/19 0518  WBC 24.7* 20.0* 9.1 7.2  NEUTROABS 19.6*  --  6.0 3.6  HGB 12.6* 11.4* 11.6* 11.6*  HCT 40.8 37.3* 37.9* 37.8*  MCV 93.6 94.7 93.6 93.1  PLT 226 200 213 230   Cardiac Enzymes: No results for input(s): CKTOTAL, CKMB, CKMBINDEX, TROPONINI in the last 168 hours. BNP: Invalid input(s): POCBNP CBG: Recent Labs  Lab 05/09/19 1144 05/09/19 1636 05/09/19 2045 05/10/19 0748 05/10/19 1154  GLUCAP 117* 143* 143* 116* 129*   D-Dimer No results for input(s): DDIMER in the last 72 hours. Hgb A1c Recent Labs    05/07/19 2227  HGBA1C 6.7*   Lipid Profile No results for input(s): CHOL, HDL, LDLCALC, TRIG, CHOLHDL, LDLDIRECT in the last 72 hours. Thyroid function studies No results for input(s): TSH, T4TOTAL, T3FREE, THYROIDAB in the  last 72 hours.  Invalid input(s): FREET3 Anemia work up Recent Labs    05/09/19 0527 05/09/19 0922  VITAMINB12  --  720  FOLATE  --  11.3  FERRITIN  --  202  TIBC  --  252  IRON  --  26*  RETICCTPCT 1.0  --    Urinalysis    Component Value Date/Time   COLORURINE YELLOW 05/07/2019 1324   APPEARANCEUR HAZY (A) 05/07/2019 1324   LABSPEC 1.024 05/07/2019 1324   PHURINE 5.0 05/07/2019 1324   GLUCOSEU NEGATIVE 05/07/2019 1324   HGBUR NEGATIVE 05/07/2019 1324   BILIRUBINUR NEGATIVE 05/07/2019 1324   BILIRUBINUR negative 12/30/2016 1308   KETONESUR 5 (A) 05/07/2019 1324   PROTEINUR NEGATIVE 05/07/2019 1324   UROBILINOGEN 0.2 12/30/2016 1308   UROBILINOGEN 1.0 11/22/2012 1633   NITRITE NEGATIVE 05/07/2019 1324   LEUKOCYTESUR MODERATE (A) 05/07/2019 1324   Sepsis Labs  Invalid input(s): PROCALCITONIN,  WBC,  LACTICIDVEN Microbiology Recent Results (from the past 240 hour(s))  Urine culture     Status: Abnormal   Collection Time: 05/07/19  1:24 PM   Specimen: Urine, Clean Catch  Result Value Ref Range Status   Specimen Description   Final    URINE, CLEAN CATCH Performed at [facility], [place] [address] [place], [place] [address]    Special Requests   Final    NONE Performed at [facility], [place] [address] [place], [place] [address]    Culture MULTIPLE SPECIES PRESENT, SUGGEST RECOLLECTION (A)  Final   Report Status 05/08/2019 FINAL  Final  SARS Coronavirus 2 [facility] order, Performed in [facility] hospital lab) Nasopharyngeal Nasopharyngeal Swab     Status: None   Collection Time: 05/07/19  1:54 PM   Specimen: Nasopharyngeal Swab  Result Value Ref Range Status   SARS Coronavirus 2 NEGATIVE NEGATIVE Final    Comment: (NOTE) If result is NEGATIVE SARS-CoV-2 target nucleic acids are NOT DETECTED. The SARS-CoV-2 RNA is generally detectable in upper and lower  respiratory specimens during the acute phase of infection. The lowest   concentration of SARS-CoV-2 viral copies this assay can detect is 250  copies / mL. A negative result does not preclude SARS-CoV-2 infection  and should not be used as the sole basis for treatment or other  patient management decisions.  A negative result may occur with  improper specimen collection / handling, submission of specimen other  than nasopharyngeal swab, presence of viral mutation(s) within the  areas targeted by this assay, and inadequate number of viral copies  (<250 copies / mL). A negative result must be combined with clinical  observations, patient history, and epidemiological information. If result is POSITIVE SARS-CoV-2 target nucleic acids are DETECTED. The SARS-CoV-2 RNA is generally detectable in upper and lower  respiratory specimens dur ing the acute phase of infection.  Positive  results are indicative of active infection with SARS-CoV-2.  Clinical  correlation with patient history and other diagnostic information is  necessary to determine patient infection status.  Positive results do  not rule out bacterial infection or co-infection with other viruses. If result is PRESUMPTIVE POSTIVE SARS-CoV-2 nucleic acids MAY BE PRESENT.   A presumptive positive result was obtained on the submitted specimen  and confirmed on repeat testing.  While 2019 novel coronavirus  (SARS-CoV-2) nucleic acids may be present in the submitted sample  additional confirmatory testing may be necessary for epidemiological  and / or clinical management purposes  to differentiate between  SARS-CoV-2 and other Sarbecovirus currently known to infect humans.  If clinically indicated additional testing with an alternate test  methodology [phone]) is advised. The SARS-CoV-2 RNA is generally  detectable in upper and lower respiratory sp ecimens during the acute  phase of infection. The expected result is Negative. Fact Sheet for Patients:  StrictlyIdeas.no Fact Sheet  for Healthcare Providers: BankingDealers.co.za This test is not yet approved or cleared by the [place] FDA and has been authorized for detection and/or diagnosis of SARS-CoV-2 by FDA under an Emergency Use Authorization (EUA).  This EUA will remain in effect (meaning this test can be used) for the duration of the COVID-19 declaration under Section 564(b)(1) of the Act, 21 U.S.C. section 360bbb-3(b)(1), unless the authorization is terminated or revoked sooner. Performed at [facility], [place] [address] [place], [place] [address]   Culture, Urine     Status: Abnormal  Collection Time: 05/08/19  4:25 PM   Specimen: Urine, Random  Result Value Ref Range Status   Specimen Description   Final    URINE, RANDOM Performed at [place] [address] [place], [place] [address]    Special Requests   Final    NONE Performed at [facility], [place] [address], [place], [place] [address]    Culture (A)  Final    <10,000 COLONIES/mL INSIGNIFICANT GROWTH Performed at [place] [address] [place], [place] [address]    Report Status 05/10/2019 FINAL  Final   Time coordinating discharge: 35 minutes  SIGNED:  Kerney Elbe, DO [facility] 05/10/2019, 12:19 PM Pager is on [place]  If 7PM-7AM, please contact night-coverage [email or web address] Password [email or web address]

## 2019-05-10 NOTE — Progress Notes (Signed)
Called and gave report to [facility]. Patient discharged and transported via [place].

## 2019-05-10 NOTE — Progress Notes (Signed)
PT Cancellation Note  Patient Details Name: [name] MRN: [id number] DOB: 1931-12-26   Cancelled Treatment:    Reason Eval/Treat Not Completed: Other (comment) patient discharging to SNF. Will hold PT today per policy. Please page at number below if he has urgent rehab needs prior to DC.   Deniece Ree PT, DPT, CBIS  Supplemental Physical Therapist [facility]    Pager [phone] Acute Rehab Office [phone]

## 2019-05-14 ENCOUNTER — Encounter: Payer: Self-pay | Admitting: Family Medicine

## 2019-05-16 DIAGNOSIS — M549 Dorsalgia, unspecified: Secondary | ICD-10-CM | POA: Diagnosis not present

## 2019-05-16 DIAGNOSIS — R531 Weakness: Secondary | ICD-10-CM | POA: Diagnosis not present

## 2019-05-16 DIAGNOSIS — N390 Urinary tract infection, site not specified: Secondary | ICD-10-CM | POA: Diagnosis not present

## 2019-05-16 DIAGNOSIS — M1990 Unspecified osteoarthritis, unspecified site: Secondary | ICD-10-CM | POA: Diagnosis not present

## 2019-05-28 ENCOUNTER — Other Ambulatory Visit: Payer: Self-pay | Admitting: *Deleted

## 2019-05-28 NOTE — Patient Outreach (Signed)
Member assessed for potential [facility] Care Management needs as a benefit of  [place] Medicare.  Member is currently receiving rehab therapy at [facility].  Member discussed in weekly telephonic IDT meeting with  facility staff, [facility] UM team, and writer.  Facility reports member's disposition plan will be to return home with wife. Facility reports member will need to be independent with his transfers. States his wife will not be able to lift him.   Will plan outreach to member/wife regarding [place] Management services and disposition plans.    [name], MSN-Ed, RN,BSN [place] Acute Care Coordinator [phone] [facility]) [phone]  (Toll free office)

## 2019-05-29 DIAGNOSIS — N390 Urinary tract infection, site not specified: Secondary | ICD-10-CM | POA: Diagnosis not present

## 2019-05-29 DIAGNOSIS — R531 Weakness: Secondary | ICD-10-CM | POA: Diagnosis not present

## 2019-05-29 DIAGNOSIS — N179 Acute kidney failure, unspecified: Secondary | ICD-10-CM | POA: Diagnosis not present

## 2019-05-29 DIAGNOSIS — M1990 Unspecified osteoarthritis, unspecified site: Secondary | ICD-10-CM | POA: Diagnosis not present

## 2019-06-03 DIAGNOSIS — E119 Type 2 diabetes mellitus without complications: Secondary | ICD-10-CM | POA: Diagnosis not present

## 2019-06-03 DIAGNOSIS — R531 Weakness: Secondary | ICD-10-CM | POA: Diagnosis not present

## 2019-06-03 DIAGNOSIS — M1990 Unspecified osteoarthritis, unspecified site: Secondary | ICD-10-CM | POA: Diagnosis not present

## 2019-06-03 DIAGNOSIS — N390 Urinary tract infection, site not specified: Secondary | ICD-10-CM | POA: Diagnosis not present

## 2019-06-04 ENCOUNTER — Other Ambulatory Visit: Payer: Self-pay | Admitting: *Deleted

## 2019-06-04 NOTE — Patient Outreach (Addendum)
Member assessed for potential [facility] Care Management needs as a benefit of  [place] Medicare.  Member is currently receiving rehab therapy at [facility].  Member discussed in weekly telephonic IDT meeting with facility staff, [name] UM team, and writer.  Facility reports [name] has had some acute changes with critical labs. States his goal is to be independent with transfers. His baseline is wheelchair bound with independence with transfers from chair to bed. Currently member is sba/cga on side of bed.   [facility] UM Director suggested member could benefit from palliative care services at dc.  Discussed that writer will follow up with wife regarding [facility] follow up and disposition plans. Writer advised member's wife is main contact.   Telephone call made to member's wife [name] at [phone]. Patient identifiers confirmed. [name] sounded low in energy. Writer asked it was an okay to time to talk. Wife states she is doing "so so" and that she is not sure what is going on with [name]. States " they are not telling me anything so I don't know what the plan is." [name] discontinued conversation with Probation officer.   Writer will follow up at later time. Will update facility and [facility] UM RN.   Will continue to follow for disposition plans, progression and collaborate with facility and [facility] UM team while member resides a [facility].    Justin Rolling, MSN-Ed, RN,BSN [place] Acute Care Coordinator [phone] [facility]) [phone]  (Toll free office)

## 2019-06-05 DIAGNOSIS — E119 Type 2 diabetes mellitus without complications: Secondary | ICD-10-CM | POA: Diagnosis not present

## 2019-06-05 DIAGNOSIS — G8929 Other chronic pain: Secondary | ICD-10-CM | POA: Diagnosis not present

## 2019-06-05 DIAGNOSIS — M1990 Unspecified osteoarthritis, unspecified site: Secondary | ICD-10-CM | POA: Diagnosis not present

## 2019-06-05 DIAGNOSIS — R531 Weakness: Secondary | ICD-10-CM | POA: Diagnosis not present

## 2019-06-10 DIAGNOSIS — R531 Weakness: Secondary | ICD-10-CM | POA: Diagnosis not present

## 2019-06-10 DIAGNOSIS — M1990 Unspecified osteoarthritis, unspecified site: Secondary | ICD-10-CM | POA: Diagnosis not present

## 2019-06-10 DIAGNOSIS — E119 Type 2 diabetes mellitus without complications: Secondary | ICD-10-CM | POA: Diagnosis not present

## 2019-06-10 DIAGNOSIS — E785 Hyperlipidemia, unspecified: Secondary | ICD-10-CM | POA: Diagnosis not present

## 2019-06-11 ENCOUNTER — Other Ambulatory Visit: Payer: Self-pay | Admitting: *Deleted

## 2019-06-11 NOTE — Patient Outreach (Signed)
Member assessed for potential [name] Care Management needs as a benefit of  [place] Medicare.  Member is currently receiving rehab therapy at [facility].  Member discussed in weekly telephonic IDT meeting with facility staff, [name] UM team, and writer.  Facility reports [name] is being treated for UTI currently. He has a scheduled nephrologist appointment on 06/12/19.  He is improving with therapy however.  Disposition plan remains home with wife. Discussed that [name] was not receptive to writer's call last week. Facility reports they will encourage [name] Care Management engagement with family. Member also have a supportive daughter who is involved in his care.  Will plan on making [facility] Care Management referral upon SNF discharge. [name] is high risk.  Will continue to follow for disposition plans and progression and collaborate with facility and [facility] UM team on [name]  while he resides at [facility].   [name], MSN-Ed, RN,BSN [place] Acute Care Coordinator [phone] [facility]) [phone]  (Toll free office)

## 2019-06-13 DIAGNOSIS — M1990 Unspecified osteoarthritis, unspecified site: Secondary | ICD-10-CM | POA: Diagnosis not present

## 2019-06-13 DIAGNOSIS — G8929 Other chronic pain: Secondary | ICD-10-CM | POA: Diagnosis not present

## 2019-06-13 DIAGNOSIS — N390 Urinary tract infection, site not specified: Secondary | ICD-10-CM | POA: Diagnosis not present

## 2019-06-13 DIAGNOSIS — R531 Weakness: Secondary | ICD-10-CM | POA: Diagnosis not present

## 2019-06-18 ENCOUNTER — Other Ambulatory Visit: Payer: Self-pay | Admitting: *Deleted

## 2019-06-18 ENCOUNTER — Encounter: Payer: Self-pay | Admitting: General Practice

## 2019-06-18 DIAGNOSIS — I10 Essential (primary) hypertension: Secondary | ICD-10-CM

## 2019-06-18 NOTE — Patient Outreach (Signed)
Member assessed for potential [facility] Care Management needs as a benefit of  [place] Medicare.  Member is currently receiving rehab therapy at [facility].  Member discussed in weekly telephonic IDT meeting with facility staff, [facility] UM team, and writer.  Facility reports Mr. Zhen will transition home on Thursday 06/20/19 with his wife. Facility reports member's daughter in law, Rise Paganini, is the main contact now because Mrs. Cuppett is very anxious.   Facility reports Mr. Gossman has an Nephrologist appointment on Friday 06/21/19 and he will need transportation assistance. Facility asks that Probation officer please contact family regarding [place] Management follow up.  Will plan outreach to family.   [name], MSN-Ed, RN,BSN [place] Acute Care Coordinator [phone] [facility]) [phone]  (Toll free office)

## 2019-06-18 NOTE — Patient Outreach (Signed)
Member assessed for potential [facility] Care Management needs as a benefit of [place] Medicare.  Telephone call made to member's wife at [phone]. Before Probation officer could make introduction of where calling from, Mrs. Finigan abruptly interrupts  " ma'am, please call my daughter in law because she is handling everything. Her number is [phone]." Writer confirmed daughter in [place] name Willette Alma.  Telephone call made to [facility] as requested. Patient identifiers confirmed. Rise Paganini confirms that she should be called regarding disposition plans and the like. States Mrs. Minahan gets very anxious and "panicky."  Rise Paganini states Mr. Guia is 100% disabled thru the [place]. He has [employer] caregivers 3 times a week for a total of 10 hrs. This caregiver assistance is thru the [place]. He has a motorized wheelchair and his apartment handicap accessible.   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 He is very head strong. States is doing things his own way and does not take many suggestions.  Rise Paganini states she understands "we have been putting a bandaid on things." He does not want to go anywhere. He just wants to be home. Rise Paganini reports that she understands Mr. Rybicki needs a higher level of care and she plans on contacting the social worker with the [place] about long term [place] facilities or facilities that are contracting with the [place]. Encouraged Rise Paganini to also inquire about increasing aide hours as well.   Discussed whether they will be able to pay for additional caregiver assistance hours to pay out of pocket since it appears Mr. Mula will need increased assistance upon discharge. Rise Paganini states " I just have to get them on board. He will have to make a more permanent long term plan."  Rise Paganini states she is hopeful that once Mr. Scherzer gets home they can have these discussions and make a plan. Right now our priority is getting him home and to his nephrologist appointment  on Friday.   Discussed [facility] Care Management will be able to assist with transportation to 1045 am appointment on Friday, 06/21/19 to [place] confirms that Mr. Wurtz is wheelchair bound and the facility therapist recommended that he not try to get him in a car for safety reasons. Outreach made to [place] worker to discuss appointment for Friday.  Asked Rise Paganini about palliative care follow up regarding ongoing discussions about his goals of care. She states, "that may not be a bad idea. We just need to get him home to see what we are dealing with."  Rise Paganini is agreeable to [facility] follow up. She is the primary contact at [phone]. She also confirmed Primary Care is Dr. Birdie Riddle.  Will make referral to [place] Management and [facility] Social Worker.  Updated [facility] dc planner and [facility] UM RN of conversation with daughter in law.  [name], MSN-Ed, RN,BSN [place] Acute Care Coordinator [phone] [facility]) [phone]  (Toll free office)

## 2019-06-19 ENCOUNTER — Other Ambulatory Visit: Payer: Self-pay

## 2019-06-19 DIAGNOSIS — E119 Type 2 diabetes mellitus without complications: Secondary | ICD-10-CM | POA: Diagnosis not present

## 2019-06-19 DIAGNOSIS — M1990 Unspecified osteoarthritis, unspecified site: Secondary | ICD-10-CM | POA: Diagnosis not present

## 2019-06-19 DIAGNOSIS — R531 Weakness: Secondary | ICD-10-CM | POA: Diagnosis not present

## 2019-06-19 DIAGNOSIS — N390 Urinary tract infection, site not specified: Secondary | ICD-10-CM | POA: Diagnosis not present

## 2019-06-19 NOTE — Patient Outreach (Signed)
[name]) Care Management  06/19/2019  [name] 08-09-1932 [id number]   Referral received from [place], [name].  "Please assign to [place] for complex case management and [facility] Social Worker [employer] account planner) for transportation assistance and long term care planning assist. Member will dc on Thursday 06/20/19 from [facility]. Please see writer's notes for additional details. Daughter in law- Willette Alma is primary contact person at [phone]" Successful outreach to daughter-in-law today.  Transportation arranged via [place] for appointment at [facility] on 06/21/19 @ 10:45 AM.  Talked with daughter-in-law about long term care planning.  Per daughter-in-law, patient has VA benefits so she left a message with the [place] Worker this morning regarding long-term care options for patient.  Encouraged her/family to continue reaching out to [place] about potential options.  Informed her about Options Counseling provided through [employer] [facility] [place].  She acknowledged that it would be helpful for family to meet with a counselor to further discuss long-term care options.  She requested that information about this service be emailed to her.  Sent email with contact information for [employer] with [employer].  Per [facility], service is currently being offered at [employer] or via phone. Will follow up with daughter-in-law next week to ensure she was able to connect with [place] and determine if there are any additional needs.  Ronn Melena, BSW Social Worker [phone]

## 2019-06-21 ENCOUNTER — Other Ambulatory Visit: Payer: Self-pay | Admitting: *Deleted

## 2019-06-21 ENCOUNTER — Encounter: Payer: Self-pay | Admitting: *Deleted

## 2019-06-21 ENCOUNTER — Other Ambulatory Visit: Payer: Self-pay

## 2019-06-21 DIAGNOSIS — D631 Anemia in chronic kidney disease: Secondary | ICD-10-CM | POA: Diagnosis not present

## 2019-06-21 DIAGNOSIS — I129 Hypertensive chronic kidney disease with stage 1 through stage 4 chronic kidney disease, or unspecified chronic kidney disease: Secondary | ICD-10-CM | POA: Diagnosis not present

## 2019-06-21 DIAGNOSIS — N182 Chronic kidney disease, stage 2 (mild): Secondary | ICD-10-CM | POA: Diagnosis not present

## 2019-06-21 DIAGNOSIS — N2581 Secondary hyperparathyroidism of renal origin: Secondary | ICD-10-CM | POA: Diagnosis not present

## 2019-06-21 DIAGNOSIS — N184 Chronic kidney disease, stage 4 (severe): Secondary | ICD-10-CM | POA: Diagnosis not present

## 2019-06-21 NOTE — Patient Outreach (Addendum)
[name]) Care Management  06/21/2019  [name] Oct 04, 1931 [id number]   In-basket message received today from [name], [name].  "I spoke with [name] dtr in law and they were leaving to go to nephrology appt. She said they're at crossroad with all this and don't think pt can be kept at home, he slid out of chair last night and took 4 EMS personnel to get him up, he's a very large man. She wants a list of agencies that can send someone to the home to assist in the interim until they figure out what they're going to do, she said they can pay of pocket for a short time. Can you please call her with these resources?" Contacted [name] today, however, she stated that it was not an ideal time to talk and she would call back later.  Will follow up with her next week if no return call today.   Addendum:  Received return call from patient's daughter-in-law.  She requested that options for in-home aide services be emailed to her.  Emailed link to [employer].   She confirmed receipt of email for Options Counseling and said that she intends to call to schedule a session.  Will follow up next week.     [name], BSW Social Worker [phone]

## 2019-06-21 NOTE — Patient Outreach (Signed)
Referral received from post acute care coordinator, pt discharged [facility] on 06/20/19, pt with diagnoses DM type 2 with neuropathy, HTN, PTSD, WC bound, arthritis, GERD, obesity, recurrent urinary tract infections.  Outreach call to patient's daughter in law [name] who reports " I can talk for a little while but we're waiting on the Lucianne Lei to get him for nephrology appointment today"  Pt is 100% disabled through [place], has some assistance already in the home but needs more assistance, daughter in law is willing to pay out of pocket " until we decide what we can do, we're at a crossroad, I don't think this is going to work at home"  Patient's adult children live out of state and stepson and daughter in law are assisting him, spouse is not able and gets upset easily.  Pt is obese and is difficult to move, daughter in law is not sure if hoyer lift would be beneficial, pt slid out of chair yesterday evening and EMS called and took 4 men to move him, so it is very difficult for one or even 2 caregivers to move pt. Daughter in law does not what CBG readings are as pt handles that, pt does ride in motorized WC outside sometime, she reports pt is stubborn. Daughter in law had to end the conversation due to pt leaving for the doctor.  RN CM faxed barrier letter and today's note to primary care MD Dr. Birdie Riddle.  [facility]N CM Care Plan Problem One     Most Recent Value  Care Plan Problem One  Knowledge deficit related to frequent urinary tract infections  Role Documenting the Problem One  Care Management Coordinator  Care Plan for Problem One  Active  THN Long Term Goal   Pt/ family will verbalize/ demonstrate improved self care to decrease urinary tract infections within 60 days  THN Long Term Goal Start Date  06/21/19  Interventions for Problem One Long Term Goal  RN CM established plan of care with daughter in law, reviewed barriers such as frequent UTI, pt being bed/ chair bound, long term  care,  RN CM sent in basket to [facility] BSW with update, daughter in law would like list of agencies and they will pay out of pocket in the interim to obtain the assistance they need until they have a long term plan, RN CM mailed successful outreach letter to pt home with 24 hour nurse line magnet, consent form, pamphlet  THN CM Short Term Goal #1   Family, pt will verbalize ways to prevent urinary tract infection within 30 days  THN CM Short Term Goal #1 Start Date  06/21/19  Interventions for Short Term Goal #1  RN CM discussed frequency of UTI's with daughter in law, did not get to discuss further interventions due to pt had to leave for MD appointment    [facility] CM Care Plan Problem Two     Most Recent Value  Care Plan Problem Two  Pt at risk for falls, skin issues due to being bed/ WC bound  Role Documenting the Problem Two  Care Management [place] for Problem Two  Active  THN CM Short Term Goal #1   Pt, family will verbalize safety precautions within 30 days  THN CM Short Term Goal #1 Start Date  06/21/19  Interventions for Short Term Goal #2   RN CM reviewed safety precautions with daughter in law, importance of pt having adequate assistance in the home.  THN  CM Short Term Goal #2   Pt, family will verbalize ways to prevent skin breakdown within 30 days  THN CM Short Term Goal #2 Start Date  06/21/19  Interventions for Short Term Goal #2  RN CM ask that pt change positions q 2 hours with assistnace, importance of checking skin daily, keeping blood sugar under good control, choosing nutritious foods      PLAN Outreach patient's daughter in law next week Finish completing required assessments  Jacqlyn Larsen [facility] [place] [phone]

## 2019-06-24 ENCOUNTER — Ambulatory Visit (INDEPENDENT_AMBULATORY_CARE_PROVIDER_SITE_OTHER): Payer: Medicare Other | Admitting: Family Medicine

## 2019-06-24 ENCOUNTER — Encounter: Payer: Self-pay | Admitting: Family Medicine

## 2019-06-24 ENCOUNTER — Other Ambulatory Visit: Payer: Self-pay

## 2019-06-24 ENCOUNTER — Ambulatory Visit: Payer: Medicare Other

## 2019-06-24 VITALS — Temp 98.4°F | Ht 71.0 in | Wt 264.0 lb

## 2019-06-24 DIAGNOSIS — N289 Disorder of kidney and ureter, unspecified: Secondary | ICD-10-CM | POA: Diagnosis not present

## 2019-06-24 DIAGNOSIS — D649 Anemia, unspecified: Secondary | ICD-10-CM | POA: Diagnosis not present

## 2019-06-24 DIAGNOSIS — N3000 Acute cystitis without hematuria: Secondary | ICD-10-CM

## 2019-06-24 DIAGNOSIS — D72829 Elevated white blood cell count, unspecified: Secondary | ICD-10-CM

## 2019-06-24 DIAGNOSIS — Z7409 Other reduced mobility: Secondary | ICD-10-CM

## 2019-06-24 DIAGNOSIS — R6889 Other general symptoms and signs: Secondary | ICD-10-CM | POA: Diagnosis not present

## 2019-06-24 HISTORY — DX: Disorder of kidney and ureter, unspecified: N28.9

## 2019-06-24 NOTE — Progress Notes (Signed)
Virtual Visit via Video   I connected with patient on 06/24/19 at 11:00 AM EDT by a video enabled telemedicine application and verified that I am speaking with the correct person using two identifiers.  Location patient: Home Location provider: Acupuncturist, Office Persons participating in the virtual visit: Patient, Provider, [place] (Jess B)  I discussed the limitations of evaluation and management by telemedicine and the availability of in person appointments. The patient expressed understanding and agreed to proceed.  Subjective:   HPI:   Hospital f/u- pt was admitted 9/8-9/11 for UTI.  Was started on IV Ceftriaxone and then switched to oral Omnicef.  WBC was 24.7 on admission --> 7.2 at DC.  Due to ongoing weakness, he was d/c'd to SNF for ongoing PT and assistance.  Due to mild anemia was started on daily iron supplement and Meloxicam was d/c'd.  Was d/c'd from SNF on 10/22 b/c Medicare had 'run out.  They gave me 48 hrs to get out'.  'I feel pretty good but I just can't walk'.  [place] the day of d/c trying to transition from wheelchair.  When he falls, due to his size, requires EMS to assist.  Is trying to get into a [place] facility to address this.  'the soonest they can get me into a [place] doctor is 12/1'.  They have him on an 'urgent list'.  Also has calls in to SW.  Has called Birder Robson.  [facility] is involved, working with Counselling psychologist [phone]), Amber SW [phone]) Pt has [facility] coming back today at 1pm to re-establish care- come for 3hrs 3x/week.  Currently pt is using a bedpan.  'all of my vital signs are great'.  No CP, SOB.  Renal insufficiency- saw Dr Posey Pronto on 10/23.  Pt was on Bactrim DS at SNF, this has since been d/c'd.  They are considering a preventative medication for recurrent UTIs.    Reviewed H&P, D/C summary, labs, and family is bringing SNF d/c paperwork this afternoon.  Meds reconciled.  ROS:   See pertinent positives and negatives per HPI.  Patient Active  Problem List   Diagnosis Date Noted  . UTI (urinary tract infection) 05/07/2019  . Dyspnea 09/16/2017  . Wheelchair confinement status 07/18/2017  . Bilateral lower extremity edema 07/18/2017  . Cholecystitis 01/16/2017  . Depression 08/15/2016  . GERD (gastroesophageal reflux disease) 08/15/2016  . HLD (hyperlipidemia) 08/15/2016  . Abdominal pain 08/15/2016  . Elevated liver function tests   . Closed right fibular fracture 07/27/2016  . Emphysematous cholecystitis s/p perc cholecystostomy drain 07/21/2016 07/20/2016  . Pressure injury of skin 07/20/2016  . Hx of pulmonary embolus 07/20/2016  . Hyponatremia 07/20/2016  . Urinary retention   . Type 2 diabetes with nephropathy ([place])   . Chronic post-traumatic stress disorder (PTSD) 11/30/2015  . Chronic pain syndrome 11/30/2015  . Morbid obesity due to excess calories ([place]) 11/30/2015  . Nocturia more than twice per night 11/30/2015  . Closed displaced bimalleolar fracture of right ankle 02/20/2015  . Duodenal ulcer, perforated s/p repair/omental patch 11/26/2012  . Helicobacter pylori gastritis 11/26/2012  . Hypotension 11/16/2012  . Leukocytosis 11/12/2012  . Lumbosacral radiculopathy at L5 11/12/2012  . Herniated lumbar intervertebral disc 11/12/2012  . Essential hypertension, benign 11/12/2012  . Unspecified constipation 11/12/2012  . History of back surgery 11/09/2012  . Obesity 11/09/2012    Social History   Tobacco Use  . Smoking status: Never Smoker  . Smokeless tobacco: Current User    Types: Chew  Substance Use Topics  . Alcohol use: Not Currently    Current Outpatient Medications:  .  acetaminophen (TYLENOL) 500 MG tablet, Take 500 mg by mouth every 8 (eight) hours as needed for mild pain., Disp: , Rfl:  .  albuterol (PROVENTIL) (2.5 MG/3ML) 0.083% nebulizer solution, Take 3 mLs (2.5 mg total) by nebulization every 2 (two) hours as needed for wheezing., Disp: 75 mL, Rfl: 12 .  ascorbic acid (VITAMIN C) 500 MG  tablet, Take 1,000 mg by mouth daily., Disp: , Rfl:  .  bisacodyl (DULCOLAX) 10 MG suppository, Place 1 suppository (10 mg total) rectally daily as needed for moderate constipation., Disp: 12 suppository, Rfl: 0 .  Blood Glucose Monitoring Suppl (ACCU-CHEK AVIVA PLUS) w/Device KIT, Use glucometer to test sugars 4 times daily. Dx. E11.9, Disp: 1 kit, Rfl: 0 .  calcium-vitamin D (OSCAL WITH D) 500-200 MG-UNIT tablet, Take 1 tablet by mouth., Disp: , Rfl:  .  carvedilol (COREG) 3.125 MG tablet, TAKE 1 TABLET TWICE A DAY WITH MEALS (Patient taking differently: Take 3.125 mg by mouth 2 (two) times daily. ), Disp: 180 tablet, Rfl: 1 .  Cholecalciferol (VITAMIN D3) 2000 units capsule, Take 2,000 Units by mouth 2 (two) times daily. , Disp: , Rfl:  .  cyanocobalamin 500 MCG tablet, Take 500 mcg by mouth 2 (two) times daily., Disp: , Rfl:  .  diclofenac sodium (VOLTAREN) 1 % GEL, Apply 2 g topically 3 (three) times daily., Disp: , Rfl:  .  docusate sodium (COLACE) 100 MG capsule, Take 1 capsule (100 mg total) by mouth 2 (two) times daily., Disp: 30 capsule, Rfl: 0 .  galantamine (RAZADYNE ER) 16 MG 24 hr capsule, Take 16 mg by mouth daily with breakfast., Disp: , Rfl:  .  glucose blood (ACCU-CHEK AVIVA PLUS) test strip, Use one strip to test sugars. Pt checks sugars 4 times daily. Dx. E11.9, Disp: 360 each, Rfl: 3 .  insulin lispro (HUMALOG) 100 UNIT/ML injection, Inject 0-5 Units into the skin 3 (three) times daily before meals. 0-200 = 0 units, 201-250 = 2 units, 251-300 = 3 units, 301-350 = 4 units, 351-400 = 5 units, >401 call MD/NP, Disp: , Rfl:  .  iron polysaccharides (NIFEREX) 150 MG capsule, Take 1 capsule (150 mg total) by mouth daily., Disp:  , Rfl:  .  isosorbide mononitrate (IMDUR) 30 MG 24 hr tablet, Take 30 mg by mouth daily., Disp: , Rfl:  .  lactose free nutrition (BOOST) LIQD, Take 237 mLs by mouth daily as needed (nutrition)., Disp: , Rfl:  .  Lancets (FREESTYLE) lancets, Use as instructed to  check sugars 4 times daily. Dx E11.9, Disp: 150 each, Rfl: 12 .  lisinopril (ZESTRIL) 20 MG tablet, Take 20 mg by mouth daily., Disp: , Rfl:  .  Magnesium Oxide 420 (252 Mg) MG TABS, Take 420 mg by mouth 2 (two) times daily. , Disp: , Rfl:  .  Melatonin 5 MG TABS, Take 5 mg by mouth at bedtime., Disp: , Rfl:  .  methocarbamol (ROBAXIN) 750 MG tablet, Take 1 tablet (750 mg total) by mouth every 6 (six) hours as needed for muscle spasms. (Patient taking differently: Take 750 mg by mouth 3 (three) times daily. Am,pm,bedtime.), Disp: 20 tablet, Rfl: 0 .  Multiple Vitamins-Minerals (PRESERVISION AREDS 2 PO), Take 1 capsule by mouth 2 (two) times daily., Disp: , Rfl:  .  ondansetron (ZOFRAN) 4 MG tablet, Take 1 tablet (4 mg total) by mouth every 6 (six) hours  as needed for nausea., Disp: 20 tablet, Rfl: 0 .  oxyCODONE (OXY IR/ROXICODONE) 5 MG immediate release tablet, Take 1 tablet (5 mg total) by mouth every 4 (four) hours as needed for moderate pain., Disp: 10 tablet, Rfl: 0 .  polyethylene glycol (MIRALAX / GLYCOLAX) packet, Take 17 g by mouth daily as needed for mild constipation. , Disp: , Rfl:  .  pravastatin (PRAVACHOL) 80 MG tablet, Take 80 mg by mouth at bedtime. , Disp: , Rfl:  .  Propylene Glycol (SYSTANE BALANCE) 0.6 % SOLN, Place 1 drop into both eyes daily as needed (dry eyes)., Disp: , Rfl:  .  QUEtiapine (SEROQUEL) 50 MG tablet, Take 50 mg by mouth at bedtime., Disp: , Rfl:  .  senna-docusate (SENOKOT-S) 8.6-50 MG tablet, Take 1 tablet by mouth at bedtime as needed for mild constipation., Disp:  , Rfl:  .  THERATEARS 0.25 % SOLN, Place 1 drop into both eyes See admin instructions. Instill 2-4 times daily, Disp: , Rfl:  .  torsemide (DEMADEX) 20 MG tablet, Take 1 tablet (20 mg total) by mouth 2 (two) times daily., Disp: 30 tablet, Rfl: 0 .  traZODone (DESYREL) 50 MG tablet, Take 50 mg by mouth at bedtime., Disp: , Rfl:  .  insulin glargine (LANTUS) 100 UNIT/ML injection, 10 units nightly,  Disp: 10 mL, Rfl: 3  Allergies  Allergen Reactions  . Oxycontin [Oxycodone Hcl] Other (See Comments)    Pt states he cant sleep w/ this med; also makes him constipated.     Objective:   Temp 98.4 F (36.9 C) (Oral)   Ht '5\' 11"'  (1.803 m)   Wt 264 lb (119.7 kg)   BMI 36.82 kg/m  AAOx3, NAD NCAT, EOMI No obvious CN deficits Obese, lying in bed Coloring WNL Pt is able to speak clearly, coherently without shortness of breath or increased work of breathing.  Thought process is linear.  Mood is appropriate.   Assessment and Plan:   UTI w/ leukocytosis- resolved.  Pt completed abx course and WBC had normalized by time of d/c.  Pt is currently asymptomatic.  Renal insufficiency- new.  DIL reports his Cr was 'all over the place' during his SNF stay.  SNF actually arranged appt w/ Nephrologist last week.  Now following w/ Dr Posey Pronto.  Daughter-in-law is bringing his SNF records for me to review this afternoon.  Anemia- pt's lowest Hgb in the hospital was 11.4.  Was started on daily iron supplement.  Family reports recent lab work done.  Will defer additional labs and review once available  Dependent for transfers- this is pt's biggest issue at this time.  He is unable to get from bed to chair independently and given his size, family is unable to help him.  When he falls, it requires EMS to get him up.  He is hoping for placement in a [place] facility since Medicare told him he had to vacate the previous facility.  Unfortunately [facility] states [facility] are no [facility] to even discuss this until 12/1.  Family has contacts at [facility] and has reached out to both the [place] and their local congressman to try and get assistance.  Will follow and help as able.    Annye Asa, MD 06/24/2019

## 2019-06-24 NOTE — Progress Notes (Signed)
I have discussed the procedure for the virtual visit with the patient who has given consent to proceed with assessment and treatment.   Donivin Wirt L Sheala Dosh, CMA     

## 2019-06-26 ENCOUNTER — Other Ambulatory Visit: Payer: Self-pay

## 2019-06-26 ENCOUNTER — Other Ambulatory Visit: Payer: Self-pay | Admitting: *Deleted

## 2019-06-26 NOTE — Patient Outreach (Signed)
[facility]) Care Management  06/26/2019  [name] [birth year] [id number]   Follow up call to patient's daughter-in-law today.  Family is still working to get patient into [place] facility.  Initially family was informed that patient could not see [facility] MD until 07/30/19.  Daughter reported today that she is working closely with [facility] Education officer, museum and expecting a call back today to expedite appointment with MD.   Daughter also reported that [facility] will be assisting with in-home services until patient can transition to facility.  Due to patient/family working closely with [facility] Social Worker, [facility] Social Work case will be closed at this time.  Did encourage daughter to call, however, if additional needs arise.  Ronn Melena, BSW Social Worker [phone]

## 2019-06-26 NOTE — Patient Outreach (Signed)
[name]) Care Management  06/26/2019  [name] [birth year] [id number]   Outreach call to pt for completion of assessments that required speaking with pt, spoke with pt, HIPAA verified, completed assessments except medication review and pt stated RN CM will need to review this with daughter in law [place].  Pt states CBG is 80-110 and he checks at least TID, pt states he is drinking adequate fluids and mostly water.  Pt states he is in process of getting hoyer lift with reimbursement through [place] and also " checking on somewhere to go that can look after me".  Pt states he continues to be bedridden.   RN CM called daughter in law [place] Allred with no answer to telephone, left voicemail requesting return phone call to review medications.  [facility] CM Care Plan Problem One     Most Recent Value  Care Plan Problem One  Knowledge deficit related to frequent urinary tract infections  Role Documenting the Problem One  Care Management Coordinator  Care Plan for Problem One  Active  [facility] Long Term Goal   Pt/ family will verbalize/ demonstrate improved self care to decrease urinary tract infections within 60 days  [facility] Long Term Goal Start Date  06/21/19  Interventions for Problem One Long Term Goal  RN CM reviewed upcoming appointments, ask pt to continue drinking water and staying well hydrated, pt continues following up with nephrologist.  [facility] CM Short Term Goal #1   Family, pt will verbalize ways to prevent urinary tract infection within 30 days  [facility] CM Short Term Goal #1 Start Date  06/21/19  Interventions for Short Term Goal #1  RN CM reviewed ways to prevent urinary tract infection, reviewed signs/ symptoms of UTI    [facility] CM Care Plan Problem Two     Most Recent Value  Care Plan Problem Two  Pt at risk for falls, skin issues due to being bed/ WC bound  Role Documenting the Problem Two  Care Management Coordinator  Care Plan for Problem Two  Active  [facility] CM Short Term  Goal #1   Pt, family will verbalize safety precautions within 30 days  [facility] CM Short Term Goal #1 Start Date  06/21/19  Interventions for Short Term Goal #2   RN CM reinforced safety precautions  [facility] CM Short Term Goal #2   Pt, family will verbalize ways to prevent skin breakdown within 30 days  [facility] CM Short Term Goal #2 Start Date  06/21/19  Interventions for Short Term Goal #2  RN CM reviewed with pt the importance of changing positions q 2 hours, inspecting skin daily      PLAN Outreach next month for telephone assessment  Jacqlyn Larsen [facility], [place] Coordinator [phone]

## 2019-07-01 ENCOUNTER — Other Ambulatory Visit: Payer: Self-pay | Admitting: *Deleted

## 2019-07-01 NOTE — Patient Outreach (Signed)
Outreach call to patient's daughter in law [place] for medication reconciliation, spoke with [place] and reviewed all medications.  Jacqlyn Larsen [facility], [place] Coordinator [phone]

## 2019-07-03 ENCOUNTER — Telehealth: Payer: Self-pay | Admitting: Family Medicine

## 2019-07-03 NOTE — Telephone Encounter (Signed)
Called and LMOVM to give verbal ok.  

## 2019-07-03 NOTE — Telephone Encounter (Signed)
Yes  That would be great

## 2019-07-03 NOTE — Telephone Encounter (Signed)
Mechele Claude from [place] called in stating that they received a direct referral form [facility] to see the pt for Nursing and Social work in home care. Mechele Claude wanted to know if Dr. Birdie Riddle is open to this for this pt since he has had some recent hospital stays. To give the verbal ok please call Manus Gunning at [phone].

## 2019-07-03 NOTE — Telephone Encounter (Signed)
Please advise 

## 2019-07-08 ENCOUNTER — Telehealth: Payer: Self-pay | Admitting: Family Medicine

## 2019-07-08 DIAGNOSIS — E1121 Type 2 diabetes mellitus with diabetic nephropathy: Secondary | ICD-10-CM

## 2019-07-08 MED ORDER — ACCU-CHEK AVIVA PLUS W/DEVICE KIT: PACK | 0 refills | Status: AC

## 2019-07-08 MED ORDER — ACCU-CHEK AVIVA PLUS VI STRP: ORAL_STRIP | 3 refills | Status: AC

## 2019-07-08 NOTE — Telephone Encounter (Signed)
[name], [place]'s daughter in law, called saying that the patient glucose monitor, accu-check avivalus,  is not working like it suppose to. They did put new batteries in the monitor but she says that sometimes it works, and sometimes it doesn't. They are wanting to know if a new rx could be called in for a new monitor. Patient pharmacy is [facility] [place].

## 2019-07-08 NOTE — Telephone Encounter (Signed)
Called and advised pt family.

## 2019-07-08 NOTE — Telephone Encounter (Signed)
Medication filled to pharmacy as requested.   

## 2019-07-17 ENCOUNTER — Other Ambulatory Visit: Payer: Self-pay | Admitting: *Deleted

## 2019-07-17 NOTE — Patient Outreach (Addendum)
Outreach call to patient's daughter in law Willette Alma, HIPAA verified, Rise Paganini reports pt is doing well, urinary tract infection " cleared up" and pt was placed on preventive medication for UTI (she is unsure name of this)  Pt has had no falls, PT continues working with pt 2 x week,  CBG 101 today and pt continues to check TID with readings " ok".  [facility] is involved and pt has started process for long term care, pt continues to have [facility] ([place] pays for this) 5 days per week/ 3 hours daily, pt did get hoyer lift and family is using as needed.  No new concerns voiced.  [facility] CM Care Plan Problem One     Most Recent Value  Care Plan Problem One  Knowledge deficit related to frequent urinary tract infections  Role Documenting the Problem One  Care Management Coordinator  Care Plan for Problem One  Active  [facility] Long Term Goal   Pt/ family will verbalize/ demonstrate improved self care to decrease urinary tract infections within 60 days  [facility] Long Term Goal Start Date  06/21/19  Interventions for Problem One Long Term Goal  RN CM reinforced plan of care, pt did see nephrologist and is on preventive medication for UTI, pt is trying to drink adequate water  [facility] CM Short Term Goal #1   Family, pt will verbalize ways to prevent urinary tract infection within 30 days  [facility] CM Short Term Goal #1 Start Date  06/21/19  Interventions for Short Term Goal #1  RN CM reinforced preventive measures for UTI, taking medication as prescribed, drinking adequate fluids, reporting any symptoms early on, keeping CBG within normal limits    [facility] CM Care Plan Problem Two     Most Recent Value  Care Plan Problem Two  Pt at risk for falls, skin issues due to being bed/ WC bound  Role Documenting the Problem Two  Care Management Coordinator  Care Plan for Problem Two  Active  [facility] CM Short Term Goal #1   Pt, family will verbalize safety precautions within 30 days  [facility] CM Short Term Goal #1 Start Date  06/21/19  Interventions  for Short Term Goal #2   RN CM reviewed safety precautions, pt is mostly bedbound, continues working with PT  [facility] CM Short Term Goal #2   Pt, family will verbalize ways to prevent skin breakdown within 30 days  [facility] CM Short Term Goal #2 Start Date  06/21/19  [facility] CM Short Term Goal #2 Met Date  07/17/19  Interventions for Short Term Goal #2  RN CM reinforced importance of changing positions q 2 hours, keeping skin clean and dry, good nutrition      PLAN Outreach pt next month for telephone assessment  Jacqlyn Larsen [facility], [place] Coordinator [phone]

## 2019-07-19 ENCOUNTER — Telehealth: Payer: Self-pay | Admitting: *Deleted

## 2019-07-19 NOTE — Telephone Encounter (Signed)
Patient wife and daughter-in-law notified of PCP recommendations and is agreement and expresses an understanding.   [place] for [facility] to Discuss results / PCP recommendations / Schedule patient.

## 2019-07-19 NOTE — Telephone Encounter (Signed)
Antonieta Iba, RN with [facility] went to do an assessment on patient yesterday.  Family had the following questions:  Ever since he was hospitalized with UTI he has become bed bound.  He is currently receiving nursing care, and support with [facility].  Wife and daughter in law are concerned because it is becoming very difficult for him to check his sugar 4 times a day.   They are asking if there is an option to do the continuous in the arm machines because he is having such a difficult time pricking his finger.    They also state that he is complaining of like a thick mucous that he is struggling with since he is laying in the bed constantly. She states that his lungs are clear, but they want to know if there is anything that he could take to help with that.   She states that for any local rx's [facility] [place].   Nurse asks that someone call her back with these answers and she can talk with the family.  Her phone number is [phone]

## 2019-07-19 NOTE — Telephone Encounter (Signed)
Mucinex to thin mucous.  I am in favor of a CGM system but his diabetes care comes from the [place].  This may be something they need to ask them first as I am not aware what is covered.

## 2019-07-19 NOTE — Telephone Encounter (Signed)
Please advise 

## 2019-07-24 ENCOUNTER — Telehealth: Payer: Self-pay | Admitting: *Deleted

## 2019-07-24 NOTE — Telephone Encounter (Signed)
Home Care Nurse, Almyra Free, called to report that she was at the patient's home today, and he has an area on his left calf that was rubbed by his compression stockings.  It is starting to look better since they have left the compression stockings off for now, however there is a blister that she is worried will pop soon.  Since he is a diabetic, she was not sure if Dr. Birdie Riddle would want to call in a topical cream in case it pops so that he does not end up with any infection  Julie's CB# [phone]

## 2019-07-28 NOTE — Telephone Encounter (Signed)
No cream needed unless infection arises

## 2019-07-29 NOTE — Telephone Encounter (Signed)
Called and tried to leave a message with Almyra Free. Will call back again.

## 2019-07-30 NOTE — Telephone Encounter (Signed)
Spoke with pt daughter-in-law and advised.

## 2019-08-06 ENCOUNTER — Telehealth: Payer: Self-pay | Admitting: Family Medicine

## 2019-08-06 NOTE — Telephone Encounter (Signed)
Daughter in law called wanting to know if Dr Posey Pronto sent a Dr note and Rx? Please advise and Thank you! If so it need to be filled at [place], [place]  Call Daughter in law [place] [phone].

## 2019-08-06 NOTE — Telephone Encounter (Signed)
Have you received a note?

## 2019-08-08 ENCOUNTER — Telehealth: Payer: Self-pay

## 2019-08-08 NOTE — Telephone Encounter (Signed)
If Dr Posey Pronto would like a prescription sent in, this is something that will need to come from him or his office.  I do not write prescriptions for other offices

## 2019-08-08 NOTE — Telephone Encounter (Signed)
If pt is having productive cough and wheezing he needs to be evaluated.  I recommend either an UC or ER as this could be very serious in someone his age and w/ his medical conditions

## 2019-08-08 NOTE — Telephone Encounter (Signed)
[name] with [facility] [phone]  Patient has a productive cough, wheezing in right upper lung. Patient has tried both Mucinex and Alka-Seltzer with no relief, requesting script be sent to pharmacy.  Also wanted to know if METHENAMINE HIPPURATE script was to [facility]? Please advise

## 2019-08-08 NOTE — Telephone Encounter (Signed)
No.  I have not received a note or a courtesy phone call regarding this issue.  I will not prescribe a medication for another doctor from another office.

## 2019-08-08 NOTE — Telephone Encounter (Signed)
Spoke with pt daughter-in-law she advised that per Dr. Posey Pronto the preventative UTI medication methenamine hippurate 1gm tablet BID needs to come from PCP.

## 2019-08-09 ENCOUNTER — Emergency Department (HOSPITAL_COMMUNITY): Payer: Medicare Other

## 2019-08-09 ENCOUNTER — Emergency Department (HOSPITAL_COMMUNITY)
Admission: EM | Admit: 2019-08-09 | Discharge: 2019-08-09 | Disposition: A | Discharge status: Home / Self Care | Payer: Medicare Other | Attending: Emergency Medicine | Admitting: Emergency Medicine

## 2019-08-09 ENCOUNTER — Emergency Department (HOSPITAL_BASED_OUTPATIENT_CLINIC_OR_DEPARTMENT_OTHER): Payer: Medicare Other

## 2019-08-09 ENCOUNTER — Other Ambulatory Visit: Payer: Self-pay

## 2019-08-09 ENCOUNTER — Encounter (HOSPITAL_COMMUNITY): Payer: Self-pay | Admitting: *Deleted

## 2019-08-09 DIAGNOSIS — R609 Edema, unspecified: Secondary | ICD-10-CM

## 2019-08-09 DIAGNOSIS — R05 Cough: Secondary | ICD-10-CM | POA: Insufficient documentation

## 2019-08-09 DIAGNOSIS — I10 Essential (primary) hypertension: Secondary | ICD-10-CM | POA: Insufficient documentation

## 2019-08-09 DIAGNOSIS — Z20828 Contact with and (suspected) exposure to other viral communicable diseases: Secondary | ICD-10-CM | POA: Insufficient documentation

## 2019-08-09 DIAGNOSIS — F17220 Nicotine dependence, chewing tobacco, uncomplicated: Secondary | ICD-10-CM | POA: Insufficient documentation

## 2019-08-09 DIAGNOSIS — J40 Bronchitis, not specified as acute or chronic: Secondary | ICD-10-CM | POA: Diagnosis not present

## 2019-08-09 DIAGNOSIS — R2241 Localized swelling, mass and lump, right lower limb: Secondary | ICD-10-CM | POA: Insufficient documentation

## 2019-08-09 DIAGNOSIS — M7989 Other specified soft tissue disorders: Secondary | ICD-10-CM | POA: Diagnosis not present

## 2019-08-09 DIAGNOSIS — E119 Type 2 diabetes mellitus without complications: Secondary | ICD-10-CM | POA: Insufficient documentation

## 2019-08-09 DIAGNOSIS — Z79899 Other long term (current) drug therapy: Secondary | ICD-10-CM | POA: Diagnosis not present

## 2019-08-09 DIAGNOSIS — R0789 Other chest pain: Secondary | ICD-10-CM | POA: Insufficient documentation

## 2019-08-09 DIAGNOSIS — B9689 Other specified bacterial agents as the cause of diseases classified elsewhere: Secondary | ICD-10-CM | POA: Diagnosis not present

## 2019-08-09 DIAGNOSIS — Z794 Long term (current) use of insulin: Secondary | ICD-10-CM | POA: Insufficient documentation

## 2019-08-09 DIAGNOSIS — R0602 Shortness of breath: Secondary | ICD-10-CM | POA: Diagnosis present

## 2019-08-09 LAB — COMPREHENSIVE METABOLIC PANEL
ALT: 18 U/L (ref 0–44)
AST: 17 U/L (ref 15–41)
Albumin: 3.2 g/dL — ABNORMAL LOW (ref 3.5–5.0)
Alkaline Phosphatase: 75 U/L (ref 38–126)
Anion gap: 9 (ref 5–15)
BUN: 23 mg/dL (ref 8–23)
CO2: 30 mmol/L (ref 22–32)
Calcium: 9.1 mg/dL (ref 8.9–10.3)
Chloride: 94 mmol/L — ABNORMAL LOW (ref 98–111)
Creatinine, Ser: 0.76 mg/dL (ref 0.61–1.24)
GFR calc Af Amer: >60 mL/min (ref >60)
GFR calc non Af Amer: >60 mL/min (ref >60)
Glucose, Bld: 123 mg/dL — ABNORMAL HIGH (ref 70–99)
Potassium: 4.4 mmol/L (ref 3.5–5.1)
Sodium: 133 mmol/L — ABNORMAL LOW (ref 135–145)
Total Bilirubin: 0.4 mg/dL (ref 0.3–1.2)
Total Protein: 6.2 g/dL — ABNORMAL LOW (ref 6.5–8.1)

## 2019-08-09 LAB — URINALYSIS, ROUTINE W REFLEX MICROSCOPIC
Bacteria, UA: NONE SEEN
Bilirubin Urine: NEGATIVE
Glucose, UA: NEGATIVE mg/dL
Hgb urine dipstick: NEGATIVE
Ketones, ur: NEGATIVE mg/dL
Nitrite: NEGATIVE
Protein, ur: NEGATIVE mg/dL
Specific Gravity, Urine: 1.012 (ref 1.005–1.030)
pH: 7 (ref 5.0–8.0)

## 2019-08-09 LAB — CBC WITH DIFFERENTIAL/PLATELET
Abs Immature Granulocytes: 0.09 10*3/uL — ABNORMAL HIGH (ref 0.00–0.07)
Basophils Absolute: 0 10*3/uL (ref 0.0–0.1)
Basophils Relative: 0 %
Eosinophils Absolute: 0.8 10*3/uL — ABNORMAL HIGH (ref 0.0–0.5)
Eosinophils Relative: 7 %
HCT: 39.1 % (ref 39.0–52.0)
Hemoglobin: 12.5 g/dL — ABNORMAL LOW (ref 13.0–17.0)
Immature Granulocytes: 1 %
Lymphocytes Relative: 20 %
Lymphs Abs: 2.6 10*3/uL (ref 0.7–4.0)
MCH: 29.1 pg (ref 26.0–34.0)
MCHC: 32 g/dL (ref 30.0–36.0)
MCV: 91.1 fL (ref 80.0–100.0)
Monocytes Absolute: 1.1 10*3/uL — ABNORMAL HIGH (ref 0.1–1.0)
Monocytes Relative: 9 %
Neutro Abs: 8.1 10*3/uL — ABNORMAL HIGH (ref 1.7–7.7)
Neutrophils Relative %: 63 %
Platelets: 329 10*3/uL (ref 150–400)
RBC: 4.29 MIL/uL (ref 4.22–5.81)
RDW: 13.7 % (ref 11.5–15.5)
WBC: 12.7 10*3/uL — ABNORMAL HIGH (ref 4.0–10.5)
nRBC: 0 % (ref 0.0–0.2)

## 2019-08-09 LAB — SARS CORONAVIRUS 2 (TAT 6-24 HRS): SARS Coronavirus 2: NEGATIVE

## 2019-08-09 LAB — TROPONIN I (HIGH SENSITIVITY)
Troponin I (High Sensitivity): 4 ng/L (ref <18)
Troponin I (High Sensitivity): 4 ng/L (ref <18)

## 2019-08-09 LAB — LACTIC ACID, PLASMA
Lactic Acid, Venous: 0.6 mmol/L (ref 0.5–1.9)
Lactic Acid, Venous: 0.6 mmol/L (ref 0.5–1.9)

## 2019-08-09 LAB — BRAIN NATRIURETIC PEPTIDE: B Natriuretic Peptide: 95.8 pg/mL (ref 0.0–100.0)

## 2019-08-09 LAB — POC SARS CORONAVIRUS 2 AG -  ED: SARS Coronavirus 2 Ag: NEGATIVE

## 2019-08-09 MED ORDER — ALBUTEROL SULFATE HFA 108 (90 BASE) MCG/ACT IN AERS
4.0000 | INHALATION_SPRAY | Freq: Once | RESPIRATORY_TRACT | Status: AC
  Administered 2019-08-09: 4 via RESPIRATORY_TRACT
  Filled 2019-08-09: qty 6.7

## 2019-08-09 MED ORDER — SODIUM CHLORIDE (PF) 0.9 % IJ SOLN
INTRAMUSCULAR | Status: AC
  Administered 2019-08-09: 3 mL
  Filled 2019-08-09: qty 50

## 2019-08-09 MED ORDER — IOHEXOL 350 MG/ML SOLN
100.0000 mL | Freq: Once | INTRAVENOUS | Status: AC | PRN
  Administered 2019-08-09: 17:00:00 100 mL via INTRAVENOUS

## 2019-08-09 MED ORDER — DOXYCYCLINE HYCLATE 100 MG PO CAPS: 100.0000 mg | ORAL_CAPSULE | Freq: Two times a day (BID) | ORAL | 0 refills | Status: AC

## 2019-08-09 MED ORDER — PREDNISONE 10 MG PO TABS: 40.0000 mg | ORAL_TABLET | Freq: Every day | ORAL | 0 refills | Status: DC

## 2019-08-09 NOTE — Discharge Instructions (Addendum)
Use the albuterol as needed for difficulty breathing and coughing up to every 4 hours.  Follow-up with your doctor.  Keep yourself quarantined while your coronavirus test is pending.  Return to the ED with difficulty breathing, chest pain or other concerns.  CT scan was sub-optimal, but doesn't show a large blood clots or pneumonia.

## 2019-08-09 NOTE — ED Notes (Signed)
[facility] called for transportation  

## 2019-08-09 NOTE — ED Triage Notes (Addendum)
BIB EMS after [facility] nurse requested he be brought to the hospital, pt is in no distress, Slight SHOB x 2 weeks reported, productive clear cough, upper rt lung congestion  No fever or N/V. Pt states he just doesn't feel well. CBG 194  154/p

## 2019-08-09 NOTE — ED Provider Notes (Signed)
  Physical Exam  BP (!) 148/68   Pulse 66   Temp 98.1 F (36.7 C) (Oral)   Resp 16   Ht 5\' 11"  (1.803 m)   Wt 120.7 kg   SpO2 96%   BMI 37.10 kg/m   Physical Exam  ED Course/Procedures     Procedures  MDM   Assuming care of patient from Dr. Wyvonnia Dusky.   Patient in the ED for cough and shortness of breath. Workup thus far shows normal troponin, negative point-of-care Covid test and reassuring lab results.  Concerning findings are as following : none Important pending results are CT scan to rule out blood clot.  According to Dr. Kingsley Callander, plan is to discharge the patient with diagnosis of bronchitis of the CT PE is negative.  Patient had no complains, no concerns from the nursing side. Will continue to monitor.   5:58 PM CT scan of the lung is suboptimal.  However there is no evidence of large blood clots.  There is some findings consistent with bronchitis.  He will be treated as such given that primary team was concerned more for bronchitis on their initial assessment. I discussed the finding with the patient and informed him to follow-up with his PCP if the symptoms get worse.  He has been prescribed doxycycline and prednisone.   Varney Biles, MD 08/09/19 1758
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
## 2019-08-09 NOTE — Telephone Encounter (Signed)
Spoke with patient's daughter in law. [facility] is on the way to take patient to ER.

## 2019-08-09 NOTE — Telephone Encounter (Signed)
Just sending in an antibiotic is not appropriate given the situation.  I would recommend the family call for EMS for transport if needed.  They can do a non-emergent ambulance run and take him to the ER (I'm not sure if they are able to transport to an UC) but given his symptoms, he needs evaluation and likely a chest xray.

## 2019-08-09 NOTE — Telephone Encounter (Signed)
LMOVM advising Almyra Free of PCP recommendations.

## 2019-08-09 NOTE — Telephone Encounter (Signed)
LMOVM to [facility] with recommendations of PCP

## 2019-08-09 NOTE — Progress Notes (Signed)
Lower extremity venouos has been completed.   Preliminary results in CV Proc.   Abram Sander 08/09/2019 3:34 PM

## 2019-08-09 NOTE — Telephone Encounter (Signed)
Called and advised pt daughter in law. She states an understanding. She was under the impression Dr. Ena Dawley office would reach out to Dr. Birdie Riddle. She wanted to thank you for taking such good care of her Father in law.

## 2019-08-09 NOTE — Telephone Encounter (Signed)
Almyra Free with [facility] called back stating that patient is practically bed bound and it would be extremely difficult to have him taken to UC or ER. Requesting that antibiotic is sent in. Please advise

## 2019-08-12 LAB — URINE CULTURE: Culture: 70000 — AB

## 2019-08-13 ENCOUNTER — Other Ambulatory Visit: Payer: Self-pay

## 2019-08-13 ENCOUNTER — Encounter: Payer: Self-pay | Admitting: Family Medicine

## 2019-08-13 ENCOUNTER — Ambulatory Visit (INDEPENDENT_AMBULATORY_CARE_PROVIDER_SITE_OTHER): Payer: Medicare Other | Admitting: Family Medicine

## 2019-08-13 DIAGNOSIS — E1121 Type 2 diabetes mellitus with diabetic nephropathy: Secondary | ICD-10-CM

## 2019-08-13 DIAGNOSIS — J40 Bronchitis, not specified as acute or chronic: Secondary | ICD-10-CM | POA: Diagnosis not present

## 2019-08-13 DIAGNOSIS — N390 Urinary tract infection, site not specified: Secondary | ICD-10-CM

## 2019-08-13 MED ORDER — METHENAMINE HIPPURATE 1 G PO TABS: 1.0000 g | ORAL_TABLET | Freq: Two times a day (BID) | ORAL | 1 refills | Status: AC

## 2019-08-13 NOTE — Progress Notes (Signed)
Virtual Visit via Video   I connected with patient on 08/13/19 at 11:00 AM EST by a video enabled telemedicine application and verified that I am speaking with the correct person using two identifiers.  Location patient: Home Location provider: Acupuncturist, Office Persons participating in the virtual visit: Patient, Provider, [place] (Jess B)  I discussed the limitations of evaluation and management by telemedicine and the availability of in person appointments. The patient expressed understanding and agreed to proceed.  Subjective:   HPI:   ER f/u- pt went to ER on 12/11 for productive cough, wheezing, and SOB.  Was COVID negative.  dx'd w/ bronchitis and started on Doxy and Prednisone.  Pt is concerned b/c CBG is 231 this AM w/ Prednisone.  Pt reports cough is now dry.  Family reports he will cough for up to 10 minutes at a time when he starts coughing.  Not currently on cough medication.  Recurrent UTIs- pt saw Dr Posey Pronto after he left the nursing home and was started on Methenamine 1 g BID for UTI prophylaxis.  I do not have a note on this but was told that there was no need to return to kidney doctor and to get script from PCP.  Denies current sxs.  ROS:   See pertinent positives and negatives per HPI.  Patient Active Problem List   Diagnosis Date Noted  . Renal insufficiency 06/24/2019  . Dependent for transfer from bed to chair 06/24/2019  . Anemia 06/24/2019  . UTI (urinary tract infection) 05/07/2019  . Dyspnea 09/16/2017  . Wheelchair confinement status 07/18/2017  . Bilateral lower extremity edema 07/18/2017  . Cholecystitis 01/16/2017  . Depression 08/15/2016  . GERD (gastroesophageal reflux disease) 08/15/2016  . HLD (hyperlipidemia) 08/15/2016  . Abdominal pain 08/15/2016  . Elevated liver function tests   . Closed right fibular fracture 07/27/2016  . Emphysematous cholecystitis s/p perc cholecystostomy drain 07/21/2016 07/20/2016  . Pressure injury of skin  07/20/2016  . Hx of pulmonary embolus 07/20/2016  . Hyponatremia 07/20/2016  . Urinary retention   . Type 2 diabetes with nephropathy ([place])   . Chronic post-traumatic stress disorder (PTSD) 11/30/2015  . Chronic pain syndrome 11/30/2015  . Morbid obesity due to excess calories ([place]) 11/30/2015  . Nocturia more than twice per night 11/30/2015  . Closed displaced bimalleolar fracture of right ankle 02/20/2015  . Duodenal ulcer, perforated s/p repair/omental patch 11/26/2012  . Helicobacter pylori gastritis 11/26/2012  . Hypotension 11/16/2012  . Leukocytosis 11/12/2012  . Lumbosacral radiculopathy at L5 11/12/2012  . Herniated lumbar intervertebral disc 11/12/2012  . Essential hypertension, benign 11/12/2012  . Unspecified constipation 11/12/2012  . History of back surgery 11/09/2012  . Obesity 11/09/2012    Social History   Tobacco Use  . Smoking status: Never Smoker  . Smokeless tobacco: Current User    Types: Chew  Substance Use Topics  . Alcohol use: Not Currently    Current Outpatient Medications:  .  acetaminophen (TYLENOL) 500 MG tablet, Take 500 mg by mouth every 8 (eight) hours as needed for mild pain., Disp: , Rfl:  .  albuterol (PROVENTIL) (2.5 MG/3ML) 0.083% nebulizer solution, Take 3 mLs (2.5 mg total) by nebulization every 2 (two) hours as needed for wheezing., Disp: 75 mL, Rfl: 12 .  ascorbic acid (VITAMIN C) 500 MG tablet, Take 1,000 mg by mouth daily., Disp: , Rfl:  .  Blood Glucose Monitoring Suppl (ACCU-CHEK AVIVA PLUS) w/Device KIT, Use glucometer to test sugars 4 times daily.  Dx. E11.9, Disp: 1 kit, Rfl: 0 .  calcium-vitamin D (OSCAL WITH D) 500-200 MG-UNIT tablet, Take 1 tablet by mouth., Disp: , Rfl:  .  carvedilol (COREG) 3.125 MG tablet, TAKE 1 TABLET TWICE A DAY WITH MEALS (Patient taking differently: Take 3.125 mg by mouth 2 (two) times daily. ), Disp: 180 tablet, Rfl: 1 .  Cholecalciferol (VITAMIN D3) 2000 units capsule, Take 2,000 Units by mouth 2  (two) times daily. , Disp: , Rfl:  .  cyanocobalamin 500 MCG tablet, Take 500 mcg by mouth 2 (two) times daily., Disp: , Rfl:  .  diclofenac sodium (VOLTAREN) 1 % GEL, Apply 2 g topically 4 (four) times daily as needed (pain). , Disp: , Rfl:  .  doxycycline (VIBRAMYCIN) 100 MG capsule, Take 1 capsule (100 mg total) by mouth 2 (two) times daily., Disp: 20 capsule, Rfl: 0 .  galantamine (RAZADYNE ER) 16 MG 24 hr capsule, Take 16 mg by mouth daily with breakfast., Disp: , Rfl:  .  glucose blood (ACCU-CHEK AVIVA PLUS) test strip, Use one strip to test sugars. Pt checks sugars 4 times daily. Dx. E11.9, Disp: 360 each, Rfl: 3 .  insulin glargine (LANTUS) 100 UNIT/ML injection, 10 units nightly, Disp: 10 mL, Rfl: 3 .  insulin lispro (HUMALOG) 100 UNIT/ML injection, Inject 0-5 Units into the skin 3 (three) times daily before meals. 0-200 = 0 units, 201-250 = 2 units, 251-300 = 3 units, 301-350 = 4 units, 351-400 = 5 units, >401 call MD/NP, Disp: , Rfl:  .  iron polysaccharides (NIFEREX) 150 MG capsule, Take 1 capsule (150 mg total) by mouth daily., Disp:  , Rfl:  .  isosorbide mononitrate (IMDUR) 30 MG 24 hr tablet, Take 30 mg by mouth daily., Disp: , Rfl:  .  lactose free nutrition (BOOST) LIQD, Take 237 mLs by mouth daily as needed (nutrition)., Disp: , Rfl:  .  Lancets (FREESTYLE) lancets, Use as instructed to check sugars 4 times daily. Dx E11.9, Disp: 150 each, Rfl: 12 .  lisinopril (ZESTRIL) 20 MG tablet, Take 20 mg by mouth daily., Disp: , Rfl:  .  Magnesium Oxide 420 (252 Mg) MG TABS, Take 420 mg by mouth 2 (two) times daily. , Disp: , Rfl:  .  Melatonin 5 MG TABS, Take 5 mg by mouth at bedtime., Disp: , Rfl:  .  methenamine (HIPREX) 1 g tablet, Take 1 g by mouth 2 (two) times daily., Disp: , Rfl:  .  methocarbamol (ROBAXIN) 750 MG tablet, Take 1 tablet (750 mg total) by mouth every 6 (six) hours as needed for muscle spasms. (Patient taking differently: Take 750 mg by mouth 3 (three) times daily.  Am,pm,bedtime.), Disp: 20 tablet, Rfl: 0 .  Multiple Vitamins-Minerals (PRESERVISION AREDS 2 PO), Take 1 capsule by mouth 2 (two) times daily., Disp: , Rfl:  .  polyethylene glycol (MIRALAX / GLYCOLAX) packet, Take 17 g by mouth daily as needed for mild constipation. , Disp: , Rfl:  .  pravastatin (PRAVACHOL) 80 MG tablet, Take 80 mg by mouth at bedtime. , Disp: , Rfl:  .  predniSONE (DELTASONE) 10 MG tablet, Take 4 tablets (40 mg total) by mouth daily., Disp: 20 tablet, Rfl: 0 .  Propylene Glycol (SYSTANE BALANCE) 0.6 % SOLN, Place 1 drop into both eyes daily as needed (dry eyes)., Disp: , Rfl:  .  QUEtiapine (SEROQUEL) 50 MG tablet, Take 50 mg by mouth at bedtime., Disp: , Rfl:  .  traZODone (DESYREL) 50 MG tablet, Take  50 mg by mouth at bedtime., Disp: , Rfl:  .  torsemide (DEMADEX) 20 MG tablet, Take 1 tablet (20 mg total) by mouth 2 (two) times daily. (Patient not taking: Reported on 08/13/2019), Disp: 30 tablet, Rfl: 0  Allergies  Allergen Reactions  . Oxycontin [Oxycodone Hcl] Other (See Comments)    Pt states he cant sleep w/ this med; also makes him constipated.     Objective:   There were no vitals taken for this visit. AAOx3, NAD Lying in bed NCAT, EOMI No obvious CN deficits Coloring WNL Pt is able to speak clearly, coherently without shortness of breath or increased work of breathing.  Thought process is linear.  Mood is appropriate.   Assessment and Plan:   Bronchitis- pt is recovering nicely.  Has 1 more day of prednisone.  Discussed that his elevated sugars are due to the prednisone and will improve once this is finished.  He will finish his course of Doxy and will start OTC Delsym to improve cough.  Pt expressed understanding and is in agreement w/ plan.   DM- recent sugar elevations are due to illness and prednisone.  Discussed this w/ both pt and his family.  He will continue to check his sugars but stressed that increased sugars while sick and/or on Prednisone is  normal and not to be concerned at this time.  Pt expressed understanding and is in agreement w/ plan.   Recurrent UTI- new.  Pt apparently saw Dr Posey Pronto after leaving nursing home and was started on Methenamine twice daily.  He was told to have this refilled by PCP but I have no notes to confirm or support this.  Pt is desperate for medication to be sent to [facility].  Refilled med at pt's request and will try and get nephrology notes.   Annye Asa, MD 08/13/2019

## 2019-08-13 NOTE — Progress Notes (Signed)
I have discussed the procedure for the virtual visit with the patient who has given consent to proceed with assessment and treatment.   Pt unable to obtain vitals.   Jessica L Brodmerkel, CMA     

## 2019-08-14 ENCOUNTER — Encounter: Payer: Self-pay | Admitting: *Deleted

## 2019-08-14 ENCOUNTER — Other Ambulatory Visit: Payer: Self-pay | Admitting: *Deleted

## 2019-08-14 LAB — CULTURE, BLOOD (ROUTINE X 2)
Culture: NO GROWTH
Culture: NO GROWTH
Special Requests: ADEQUATE

## 2019-08-14 NOTE — Patient Outreach (Signed)
Outreach call to patient's daughter in law [name], HIPAA verified, Ms. Allred reports pt went to ED 08/09/19 as sent there by primary MD, had CXR, pt diagnosed with bronchitis, started on medications including steroids which has elevated CBG over the past week, pt finished steroids last night and is feeling much better. CBG normally 80-100's range and was 281 yesterday with MD aware per CG, no further urinary tract infections.  [facility] is now active with pt and visiting frequently, home health continues and [facility] services provided through [place]. RN CM discussed discharge with daughter in law Rise Paganini and will close case today.  RN CM faxed case closure letter to primary MD and mailed case closure letter to primary MD.  [facility] CM Care Plan Problem One     Most Recent Value  Care Plan Problem One  Knowledge deficit related to frequent urinary tract infections  Role Documenting the Problem One  Care Management [place] for Problem One  Active  [facility] Long Term Goal   Pt/ family will verbalize/ demonstrate improved self care to decrease urinary tract infections within 60 days  [facility] Long Term Goal Start Date  06/21/19  [facility] Long Term Goal Met Date  08/14/19  Interventions for Problem One Long Term Goal  RN CM reviewed plan of care/ discharge plan and will close case today as pt has [facility] pallative care in place, home health as well and [facility] through [place] reimbursement.  [facility] CM Short Term Goal #1   Family, pt will verbalize ways to prevent urinary tract infection within 30 days  [facility] CM Short Term Goal #1 Start Date  06/21/19  [facility] CM Short Term Goal #1 Met Date  08/14/19  Interventions for Short Term Goal #1  RN CM reviewed preventive measures for UTI, reportable signs/ symptoms and importance of calling MD early on for change in health status    [facility] CM Care Plan Problem Two     Most Recent Value  Care Plan Problem Two  Pt at risk for falls, skin  issues due to being bed/ WC bound  Role Documenting the Problem Two  Care Management Coordinator  Care Plan for Problem Two  Active  [facility] CM Short Term Goal #1   Pt, family will verbalize safety precautions within 30 days  [facility] CM Short Term Goal #1 Start Date  06/21/19  [facility] CM Short Term Goal #1 Met Date   08/14/19  Interventions for Short Term Goal #2   Safety precautions reinforced,  PT continues working with pt.  [facility] CM Short Term Goal #2   Pt, family will verbalize ways to prevent skin breakdown within 30 days  [facility] CM Short Term Goal #2 Start Date  06/21/19  [facility] CM Short Term Goal #2 Met Date  07/17/19      PLAN Close case today  Jacqlyn Larsen [facility], [place] Coordinator [phone]

## 2019-09-03 ENCOUNTER — Other Ambulatory Visit: Payer: Self-pay | Admitting: Internal Medicine

## 2019-09-03 MED ORDER — GUAIFENESIN-DM 100-10 MG/5ML PO SYRP: 5.0000 mL | ORAL_SOLUTION | ORAL | 0 refills | Status: AC | PRN

## 2019-09-04 ENCOUNTER — Telehealth: Payer: Self-pay | Admitting: Family Medicine

## 2019-09-04 DIAGNOSIS — R0600 Dyspnea, unspecified: Secondary | ICD-10-CM

## 2019-09-04 DIAGNOSIS — J40 Bronchitis, not specified as acute or chronic: Secondary | ICD-10-CM

## 2019-09-04 NOTE — Telephone Encounter (Signed)
Is he using his albuterol nebs?  This would help cough and wheezing

## 2019-09-04 NOTE — Telephone Encounter (Signed)
Patient [name] Nurse Almyra Free [phone]) called at the request of the family. Pt cough is not better, they called our after hours service 09/03/2019 and pt was rx'd Robitussin. Has taken 3-4 doses. No improvement or relief Per Nurse, wheezing heard in upper chest, diminished capacity in lower.  Contact pt daughter in [place] at [phone]

## 2019-09-04 NOTE — Telephone Encounter (Signed)
Please advise 

## 2019-09-05 NOTE — Telephone Encounter (Signed)
Pt states he does not have a nebulizer machine. It may have gotten left somewhere.

## 2019-09-05 NOTE — Telephone Encounter (Signed)
Are able to order him one as DME or does this need to go through the [place]?

## 2019-09-05 NOTE — Telephone Encounter (Signed)
I placed this DME order today.

## 2019-09-07 ENCOUNTER — Inpatient Hospital Stay (HOSPITAL_COMMUNITY)
Admission: EM | Admit: 2019-09-07 | Discharge: 2019-09-18 | DRG: 177 | Disposition: A | Discharge status: Skilled Nursing Facility | Payer: Medicare Other | Attending: Internal Medicine | Admitting: Internal Medicine

## 2019-09-07 ENCOUNTER — Other Ambulatory Visit: Payer: Self-pay

## 2019-09-07 ENCOUNTER — Emergency Department (HOSPITAL_COMMUNITY): Payer: Medicare Other

## 2019-09-07 ENCOUNTER — Encounter (HOSPITAL_COMMUNITY): Payer: Self-pay

## 2019-09-07 DIAGNOSIS — Z7401 Bed confinement status: Secondary | ICD-10-CM

## 2019-09-07 DIAGNOSIS — J9601 Acute respiratory failure with hypoxia: Secondary | ICD-10-CM | POA: Diagnosis present

## 2019-09-07 DIAGNOSIS — F17220 Nicotine dependence, chewing tobacco, uncomplicated: Secondary | ICD-10-CM | POA: Diagnosis present

## 2019-09-07 DIAGNOSIS — E785 Hyperlipidemia, unspecified: Secondary | ICD-10-CM | POA: Diagnosis present

## 2019-09-07 DIAGNOSIS — M1990 Unspecified osteoarthritis, unspecified site: Secondary | ICD-10-CM | POA: Diagnosis present

## 2019-09-07 DIAGNOSIS — N179 Acute kidney failure, unspecified: Secondary | ICD-10-CM | POA: Diagnosis not present

## 2019-09-07 DIAGNOSIS — R05 Cough: Secondary | ICD-10-CM | POA: Diagnosis not present

## 2019-09-07 DIAGNOSIS — J1282 Pneumonia due to coronavirus disease 2019: Secondary | ICD-10-CM | POA: Diagnosis not present

## 2019-09-07 DIAGNOSIS — Z974 Presence of external hearing-aid: Secondary | ICD-10-CM

## 2019-09-07 DIAGNOSIS — I110 Hypertensive heart disease with heart failure: Secondary | ICD-10-CM | POA: Diagnosis present

## 2019-09-07 DIAGNOSIS — D72829 Elevated white blood cell count, unspecified: Secondary | ICD-10-CM

## 2019-09-07 DIAGNOSIS — I5032 Chronic diastolic (congestive) heart failure: Secondary | ICD-10-CM | POA: Diagnosis not present

## 2019-09-07 DIAGNOSIS — K219 Gastro-esophageal reflux disease without esophagitis: Secondary | ICD-10-CM | POA: Diagnosis present

## 2019-09-07 DIAGNOSIS — U071 COVID-19: Secondary | ICD-10-CM | POA: Diagnosis not present

## 2019-09-07 DIAGNOSIS — Z86711 Personal history of pulmonary embolism: Secondary | ICD-10-CM

## 2019-09-07 DIAGNOSIS — G4730 Sleep apnea, unspecified: Secondary | ICD-10-CM | POA: Diagnosis present

## 2019-09-07 DIAGNOSIS — F4310 Post-traumatic stress disorder, unspecified: Secondary | ICD-10-CM | POA: Diagnosis present

## 2019-09-07 DIAGNOSIS — Z209 Contact with and (suspected) exposure to unspecified communicable disease: Secondary | ICD-10-CM | POA: Diagnosis not present

## 2019-09-07 DIAGNOSIS — G4733 Obstructive sleep apnea (adult) (pediatric): Secondary | ICD-10-CM | POA: Diagnosis present

## 2019-09-07 DIAGNOSIS — G8929 Other chronic pain: Secondary | ICD-10-CM | POA: Diagnosis present

## 2019-09-07 DIAGNOSIS — R0902 Hypoxemia: Secondary | ICD-10-CM | POA: Diagnosis not present

## 2019-09-07 DIAGNOSIS — R630 Anorexia: Secondary | ICD-10-CM | POA: Diagnosis present

## 2019-09-07 DIAGNOSIS — I10 Essential (primary) hypertension: Secondary | ICD-10-CM

## 2019-09-07 DIAGNOSIS — E119 Type 2 diabetes mellitus without complications: Secondary | ICD-10-CM | POA: Diagnosis present

## 2019-09-07 DIAGNOSIS — I2699 Other pulmonary embolism without acute cor pulmonale: Secondary | ICD-10-CM

## 2019-09-07 DIAGNOSIS — R0602 Shortness of breath: Secondary | ICD-10-CM | POA: Diagnosis not present

## 2019-09-07 DIAGNOSIS — I959 Hypotension, unspecified: Secondary | ICD-10-CM | POA: Diagnosis not present

## 2019-09-07 DIAGNOSIS — Z885 Allergy status to narcotic agent status: Secondary | ICD-10-CM

## 2019-09-07 DIAGNOSIS — J9691 Respiratory failure, unspecified with hypoxia: Secondary | ICD-10-CM

## 2019-09-07 DIAGNOSIS — M549 Dorsalgia, unspecified: Secondary | ICD-10-CM | POA: Diagnosis present

## 2019-09-07 DIAGNOSIS — Z9119 Patient's noncompliance with other medical treatment and regimen: Secondary | ICD-10-CM

## 2019-09-07 DIAGNOSIS — Z23 Encounter for immunization: Secondary | ICD-10-CM

## 2019-09-07 DIAGNOSIS — Z6837 Body mass index (BMI) 37.0-37.9, adult: Secondary | ICD-10-CM

## 2019-09-07 DIAGNOSIS — Z794 Long term (current) use of insulin: Secondary | ICD-10-CM

## 2019-09-07 DIAGNOSIS — F329 Major depressive disorder, single episode, unspecified: Secondary | ICD-10-CM | POA: Diagnosis present

## 2019-09-07 DIAGNOSIS — E7800 Pure hypercholesterolemia, unspecified: Secondary | ICD-10-CM | POA: Diagnosis present

## 2019-09-07 DIAGNOSIS — Z79899 Other long term (current) drug therapy: Secondary | ICD-10-CM

## 2019-09-07 DIAGNOSIS — Z66 Do not resuscitate: Secondary | ICD-10-CM | POA: Diagnosis present

## 2019-09-07 DIAGNOSIS — L89151 Pressure ulcer of sacral region, stage 1: Secondary | ICD-10-CM | POA: Diagnosis present

## 2019-09-07 LAB — CBC WITH DIFFERENTIAL/PLATELET
Abs Immature Granulocytes: 0.02 10*3/uL (ref 0.00–0.07)
Basophils Absolute: 0 10*3/uL (ref 0.0–0.1)
Basophils Relative: 0 %
Eosinophils Absolute: 0.1 10*3/uL (ref 0.0–0.5)
Eosinophils Relative: 2 %
HCT: 41.1 % (ref 39.0–52.0)
Hemoglobin: 12.6 g/dL — ABNORMAL LOW (ref 13.0–17.0)
Immature Granulocytes: 0 %
Lymphocytes Relative: 23 %
Lymphs Abs: 1.3 10*3/uL (ref 0.7–4.0)
MCH: 28.6 pg (ref 26.0–34.0)
MCHC: 30.7 g/dL (ref 30.0–36.0)
MCV: 93.2 fL (ref 80.0–100.0)
Monocytes Absolute: 0.7 10*3/uL (ref 0.1–1.0)
Monocytes Relative: 12 %
Neutro Abs: 3.4 10*3/uL (ref 1.7–7.7)
Neutrophils Relative %: 63 %
Platelets: 218 10*3/uL (ref 150–400)
RBC: 4.41 MIL/uL (ref 4.22–5.81)
RDW: 13.8 % (ref 11.5–15.5)
WBC: 5.4 10*3/uL (ref 4.0–10.5)
nRBC: 0 % (ref 0.0–0.2)

## 2019-09-07 LAB — COMPREHENSIVE METABOLIC PANEL
ALT: 21 U/L (ref 0–44)
AST: 23 U/L (ref 15–41)
Albumin: 3 g/dL — ABNORMAL LOW (ref 3.5–5.0)
Alkaline Phosphatase: 88 U/L (ref 38–126)
Anion gap: 9 (ref 5–15)
BUN: 17 mg/dL (ref 8–23)
CO2: 29 mmol/L (ref 22–32)
Calcium: 8.6 mg/dL — ABNORMAL LOW (ref 8.9–10.3)
Chloride: 100 mmol/L (ref 98–111)
Creatinine, Ser: 0.6 mg/dL — ABNORMAL LOW (ref 0.61–1.24)
GFR calc Af Amer: >60 mL/min (ref >60)
GFR calc non Af Amer: >60 mL/min (ref >60)
Glucose, Bld: 97 mg/dL (ref 70–99)
Potassium: 3.9 mmol/L (ref 3.5–5.1)
Sodium: 138 mmol/L (ref 135–145)
Total Bilirubin: 1 mg/dL (ref 0.3–1.2)
Total Protein: 6.3 g/dL — ABNORMAL LOW (ref 6.5–8.1)

## 2019-09-07 LAB — POC SARS CORONAVIRUS 2 AG -  ED: SARS Coronavirus 2 Ag: POSITIVE — AB

## 2019-09-07 MED ORDER — IPRATROPIUM BROMIDE HFA 17 MCG/ACT IN AERS
2.0000 | INHALATION_SPRAY | Freq: Once | RESPIRATORY_TRACT | Status: AC
  Administered 2019-09-07: 2 via RESPIRATORY_TRACT

## 2019-09-07 MED ORDER — ISOSORBIDE MONONITRATE ER 30 MG PO TB24
30.0000 mg | ORAL_TABLET | Freq: Every day | ORAL | Status: DC
  Administered 2019-09-08 – 2019-09-13 (×6): 30 mg via ORAL
  Filled 2019-09-07 (×6): qty 1

## 2019-09-07 MED ORDER — ALBUTEROL SULFATE (2.5 MG/3ML) 0.083% IN NEBU: 5.0000 mg | INHALATION_SOLUTION | Freq: Once | RESPIRATORY_TRACT | Status: DC

## 2019-09-07 MED ORDER — LISINOPRIL 20 MG PO TABS
20.0000 mg | ORAL_TABLET | Freq: Every day | ORAL | Status: DC
  Administered 2019-09-08 – 2019-09-13 (×6): 20 mg via ORAL
  Filled 2019-09-07 (×6): qty 1

## 2019-09-07 MED ORDER — ACETAMINOPHEN 500 MG PO TABS: 500.0000 mg | ORAL_TABLET | Freq: Three times a day (TID) | ORAL | Status: DC | PRN

## 2019-09-07 MED ORDER — PRAVASTATIN SODIUM 20 MG PO TABS
80.0000 mg | ORAL_TABLET | Freq: Every day | ORAL | Status: DC
  Administered 2019-09-08 – 2019-09-17 (×10): 80 mg via ORAL
  Filled 2019-09-07 (×10): qty 4

## 2019-09-07 MED ORDER — BENZONATATE 100 MG PO CAPS
200.0000 mg | ORAL_CAPSULE | Freq: Once | ORAL | Status: AC
  Administered 2019-09-07: 200 mg via ORAL
  Filled 2019-09-07: qty 2

## 2019-09-07 MED ORDER — CARVEDILOL 3.125 MG PO TABS
3.1250 mg | ORAL_TABLET | Freq: Two times a day (BID) | ORAL | Status: DC
  Administered 2019-09-08 – 2019-09-17 (×17): 3.125 mg via ORAL
  Filled 2019-09-07 (×19): qty 1

## 2019-09-07 MED ORDER — METHOCARBAMOL 500 MG PO TABS
750.0000 mg | ORAL_TABLET | Freq: Three times a day (TID) | ORAL | Status: DC
  Administered 2019-09-08 – 2019-09-18 (×30): 750 mg via ORAL
  Filled 2019-09-07 (×31): qty 2

## 2019-09-07 MED ORDER — INSULIN GLARGINE 100 UNIT/ML ~~LOC~~ SOLN: 10.0000 [IU] | Freq: Every day | SUBCUTANEOUS | Status: DC

## 2019-09-07 MED ORDER — SODIUM CHLORIDE 0.9 % IV SOLN: INTRAVENOUS | Status: DC

## 2019-09-07 MED ORDER — MELATONIN 5 MG PO TABS
5.0000 mg | ORAL_TABLET | Freq: Every day | ORAL | Status: DC
  Administered 2019-09-08 – 2019-09-17 (×10): 5 mg via ORAL
  Filled 2019-09-07 (×10): qty 1

## 2019-09-07 MED ORDER — METHENAMINE MANDELATE 0.5 G PO TABS
1.0000 g | ORAL_TABLET | Freq: Two times a day (BID) | ORAL | Status: DC
  Administered 2019-09-08 – 2019-09-18 (×21): 1 g via ORAL
  Filled 2019-09-07 (×26): qty 2

## 2019-09-07 MED ORDER — GALANTAMINE HYDROBROMIDE ER 8 MG PO CP24
16.0000 mg | ORAL_CAPSULE | Freq: Every day | ORAL | Status: DC
  Administered 2019-09-08 – 2019-09-18 (×11): 16 mg via ORAL
  Filled 2019-09-07 (×13): qty 2

## 2019-09-07 MED ORDER — IPRATROPIUM BROMIDE 0.02 % IN SOLN: 0.5000 mg | Freq: Once | RESPIRATORY_TRACT | Status: DC

## 2019-09-07 MED ORDER — ALBUTEROL SULFATE HFA 108 (90 BASE) MCG/ACT IN AERS
6.0000 | INHALATION_SPRAY | RESPIRATORY_TRACT | Status: DC | PRN
  Administered 2019-09-07 – 2019-09-08 (×2): 6 via RESPIRATORY_TRACT
  Filled 2019-09-07: qty 6.7

## 2019-09-07 MED ORDER — MAGNESIUM OXIDE 400 (241.3 MG) MG PO TABS
420.0000 mg | ORAL_TABLET | Freq: Two times a day (BID) | ORAL | Status: DC
  Administered 2019-09-08 – 2019-09-18 (×21): 400 mg via ORAL
  Filled 2019-09-07 (×21): qty 1

## 2019-09-07 MED ORDER — QUETIAPINE FUMARATE 25 MG PO TABS
50.0000 mg | ORAL_TABLET | Freq: Every day | ORAL | Status: DC
  Administered 2019-09-08 – 2019-09-17 (×10): 50 mg via ORAL
  Filled 2019-09-07 (×10): qty 2

## 2019-09-07 NOTE — ED Triage Notes (Signed)
Pt arrives [facility] from home. Pt reports SHOB and productive cough "for a while" Pt Reports that his daughter in law who is a daily caregiver is COVID +. Pt is bed bound from previous CVA
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
## 2019-09-07 NOTE — ED Notes (Signed)
Pt used the urinal with assistance. Output ~100cc. Pt repositioned in bed and vitals retaken. No other concerns/needs expressed at this time.

## 2019-09-08 DIAGNOSIS — M549 Dorsalgia, unspecified: Secondary | ICD-10-CM | POA: Diagnosis present

## 2019-09-08 DIAGNOSIS — Z794 Long term (current) use of insulin: Secondary | ICD-10-CM | POA: Diagnosis not present

## 2019-09-08 DIAGNOSIS — I272 Pulmonary hypertension, unspecified: Secondary | ICD-10-CM | POA: Diagnosis not present

## 2019-09-08 DIAGNOSIS — R498 Other voice and resonance disorders: Secondary | ICD-10-CM | POA: Diagnosis not present

## 2019-09-08 DIAGNOSIS — D72829 Elevated white blood cell count, unspecified: Secondary | ICD-10-CM | POA: Diagnosis not present

## 2019-09-08 DIAGNOSIS — N183 Chronic kidney disease, stage 3 unspecified: Secondary | ICD-10-CM

## 2019-09-08 DIAGNOSIS — M1990 Unspecified osteoarthritis, unspecified site: Secondary | ICD-10-CM | POA: Diagnosis present

## 2019-09-08 DIAGNOSIS — M2550 Pain in unspecified joint: Secondary | ICD-10-CM | POA: Diagnosis not present

## 2019-09-08 DIAGNOSIS — J9601 Acute respiratory failure with hypoxia: Secondary | ICD-10-CM | POA: Diagnosis present

## 2019-09-08 DIAGNOSIS — I110 Hypertensive heart disease with heart failure: Secondary | ICD-10-CM | POA: Diagnosis present

## 2019-09-08 DIAGNOSIS — J1282 Pneumonia due to coronavirus disease 2019: Secondary | ICD-10-CM

## 2019-09-08 DIAGNOSIS — R339 Retention of urine, unspecified: Secondary | ICD-10-CM | POA: Diagnosis not present

## 2019-09-08 DIAGNOSIS — F329 Major depressive disorder, single episode, unspecified: Secondary | ICD-10-CM | POA: Diagnosis present

## 2019-09-08 DIAGNOSIS — L89151 Pressure ulcer of sacral region, stage 1: Secondary | ICD-10-CM | POA: Diagnosis present

## 2019-09-08 DIAGNOSIS — Z87440 Personal history of urinary (tract) infections: Secondary | ICD-10-CM | POA: Diagnosis not present

## 2019-09-08 DIAGNOSIS — G4730 Sleep apnea, unspecified: Secondary | ICD-10-CM | POA: Diagnosis present

## 2019-09-08 DIAGNOSIS — Z66 Do not resuscitate: Secondary | ICD-10-CM | POA: Diagnosis present

## 2019-09-08 DIAGNOSIS — G4733 Obstructive sleep apnea (adult) (pediatric): Secondary | ICD-10-CM | POA: Diagnosis not present

## 2019-09-08 DIAGNOSIS — M6281 Muscle weakness (generalized): Secondary | ICD-10-CM | POA: Diagnosis not present

## 2019-09-08 DIAGNOSIS — F4310 Post-traumatic stress disorder, unspecified: Secondary | ICD-10-CM | POA: Diagnosis present

## 2019-09-08 DIAGNOSIS — G8929 Other chronic pain: Secondary | ICD-10-CM | POA: Diagnosis present

## 2019-09-08 DIAGNOSIS — E1122 Type 2 diabetes mellitus with diabetic chronic kidney disease: Secondary | ICD-10-CM | POA: Diagnosis not present

## 2019-09-08 DIAGNOSIS — E119 Type 2 diabetes mellitus without complications: Secondary | ICD-10-CM | POA: Diagnosis present

## 2019-09-08 DIAGNOSIS — F29 Unspecified psychosis not due to a substance or known physiological condition: Secondary | ICD-10-CM | POA: Diagnosis not present

## 2019-09-08 DIAGNOSIS — Z86711 Personal history of pulmonary embolism: Secondary | ICD-10-CM | POA: Diagnosis not present

## 2019-09-08 DIAGNOSIS — I2699 Other pulmonary embolism without acute cor pulmonale: Secondary | ICD-10-CM

## 2019-09-08 DIAGNOSIS — N179 Acute kidney failure, unspecified: Secondary | ICD-10-CM | POA: Diagnosis not present

## 2019-09-08 DIAGNOSIS — U071 COVID-19: Secondary | ICD-10-CM | POA: Diagnosis not present

## 2019-09-08 DIAGNOSIS — R41841 Cognitive communication deficit: Secondary | ICD-10-CM | POA: Diagnosis not present

## 2019-09-08 DIAGNOSIS — R630 Anorexia: Secondary | ICD-10-CM | POA: Diagnosis present

## 2019-09-08 DIAGNOSIS — N182 Chronic kidney disease, stage 2 (mild): Secondary | ICD-10-CM | POA: Diagnosis not present

## 2019-09-08 DIAGNOSIS — K219 Gastro-esophageal reflux disease without esophagitis: Secondary | ICD-10-CM | POA: Diagnosis present

## 2019-09-08 DIAGNOSIS — R0602 Shortness of breath: Secondary | ICD-10-CM | POA: Diagnosis not present

## 2019-09-08 DIAGNOSIS — R5381 Other malaise: Secondary | ICD-10-CM | POA: Diagnosis not present

## 2019-09-08 DIAGNOSIS — F0390 Unspecified dementia without behavioral disturbance: Secondary | ICD-10-CM | POA: Diagnosis not present

## 2019-09-08 DIAGNOSIS — R1312 Dysphagia, oropharyngeal phase: Secondary | ICD-10-CM | POA: Diagnosis not present

## 2019-09-08 DIAGNOSIS — F17220 Nicotine dependence, chewing tobacco, uncomplicated: Secondary | ICD-10-CM | POA: Diagnosis present

## 2019-09-08 DIAGNOSIS — Z9181 History of falling: Secondary | ICD-10-CM | POA: Diagnosis not present

## 2019-09-08 DIAGNOSIS — J90 Pleural effusion, not elsewhere classified: Secondary | ICD-10-CM | POA: Diagnosis not present

## 2019-09-08 DIAGNOSIS — I129 Hypertensive chronic kidney disease with stage 1 through stage 4 chronic kidney disease, or unspecified chronic kidney disease: Secondary | ICD-10-CM | POA: Diagnosis not present

## 2019-09-08 DIAGNOSIS — E785 Hyperlipidemia, unspecified: Secondary | ICD-10-CM | POA: Diagnosis present

## 2019-09-08 DIAGNOSIS — I5032 Chronic diastolic (congestive) heart failure: Secondary | ICD-10-CM | POA: Diagnosis present

## 2019-09-08 DIAGNOSIS — Z23 Encounter for immunization: Secondary | ICD-10-CM | POA: Diagnosis not present

## 2019-09-08 DIAGNOSIS — Z7401 Bed confinement status: Secondary | ICD-10-CM | POA: Diagnosis not present

## 2019-09-08 DIAGNOSIS — E7800 Pure hypercholesterolemia, unspecified: Secondary | ICD-10-CM | POA: Diagnosis present

## 2019-09-08 DIAGNOSIS — R293 Abnormal posture: Secondary | ICD-10-CM | POA: Diagnosis not present

## 2019-09-08 LAB — HEMOGLOBIN A1C
Hgb A1c MFr Bld: 6 % — ABNORMAL HIGH (ref 4.8–5.6)
Mean Plasma Glucose: 125.5 mg/dL

## 2019-09-08 LAB — LACTIC ACID, PLASMA: Lactic Acid, Venous: 0.6 mmol/L (ref 0.5–1.9)

## 2019-09-08 LAB — D-DIMER, QUANTITATIVE: D-Dimer, Quant: 2 ug/mL-FEU — ABNORMAL HIGH (ref 0.00–0.50)

## 2019-09-08 LAB — PROCALCITONIN: Procalcitonin: <0.1 ng/mL

## 2019-09-08 LAB — GLUCOSE, CAPILLARY
Glucose-Capillary: 121 mg/dL — ABNORMAL HIGH (ref 70–99)
Glucose-Capillary: 184 mg/dL — ABNORMAL HIGH (ref 70–99)
Glucose-Capillary: 195 mg/dL — ABNORMAL HIGH (ref 70–99)
Glucose-Capillary: 264 mg/dL — ABNORMAL HIGH (ref 70–99)

## 2019-09-08 LAB — TRIGLYCERIDES: Triglycerides: 86 mg/dL (ref <150)

## 2019-09-08 LAB — FIBRINOGEN: Fibrinogen: 644 mg/dL — ABNORMAL HIGH (ref 210–475)

## 2019-09-08 LAB — C-REACTIVE PROTEIN: CRP: 4.9 mg/dL — ABNORMAL HIGH (ref <1.0)

## 2019-09-08 LAB — LACTATE DEHYDROGENASE: LDH: 155 U/L (ref 98–192)

## 2019-09-08 LAB — FERRITIN: Ferritin: 177 ng/mL (ref 24–336)

## 2019-09-08 MED ORDER — VITAMIN D 25 MCG (1000 UNIT) PO TABS
2000.0000 [IU] | ORAL_TABLET | Freq: Two times a day (BID) | ORAL | Status: DC
  Administered 2019-09-08 – 2019-09-18 (×21): 2000 [IU] via ORAL
  Filled 2019-09-08 (×21): qty 2

## 2019-09-08 MED ORDER — DEXAMETHASONE 4 MG PO TABS
6.0000 mg | ORAL_TABLET | Freq: Every day | ORAL | Status: DC
  Administered 2019-09-08 – 2019-09-15 (×8): 6 mg via ORAL
  Filled 2019-09-08 (×5): qty 2
  Filled 2019-09-08: qty 1.5
  Filled 2019-09-08 (×2): qty 2

## 2019-09-08 MED ORDER — ENOXAPARIN SODIUM 60 MG/0.6ML ~~LOC~~ SOLN
60.0000 mg | SUBCUTANEOUS | Status: DC
  Administered 2019-09-08 – 2019-09-18 (×11): 60 mg via SUBCUTANEOUS
  Filled 2019-09-08 (×12): qty 0.6

## 2019-09-08 MED ORDER — POLYSACCHARIDE IRON COMPLEX 150 MG PO CAPS
150.0000 mg | ORAL_CAPSULE | Freq: Every day | ORAL | Status: DC
  Administered 2019-09-08 – 2019-09-18 (×11): 150 mg via ORAL
  Filled 2019-09-08 (×11): qty 1

## 2019-09-08 MED ORDER — ASCORBIC ACID 500 MG PO TABS
1000.0000 mg | ORAL_TABLET | Freq: Every day | ORAL | Status: DC
  Administered 2019-09-08 – 2019-09-18 (×11): 1000 mg via ORAL
  Filled 2019-09-08 (×11): qty 2

## 2019-09-08 MED ORDER — DICLOFENAC SODIUM 1 % TD GEL: 2.0000 g | Freq: Four times a day (QID) | TRANSDERMAL | Status: DC | PRN

## 2019-09-08 MED ORDER — CYANOCOBALAMIN 500 MCG PO TABS
500.0000 ug | ORAL_TABLET | Freq: Two times a day (BID) | ORAL | Status: DC
  Administered 2019-09-08 – 2019-09-18 (×21): 500 ug via ORAL
  Filled 2019-09-08 (×23): qty 1

## 2019-09-08 MED ORDER — INSULIN ASPART 100 UNIT/ML ~~LOC~~ SOLN
0.0000 [IU] | Freq: Three times a day (TID) | SUBCUTANEOUS | Status: DC
  Administered 2019-09-08: 4 [IU] via SUBCUTANEOUS
  Administered 2019-09-08: 09:00:00 3 [IU] via SUBCUTANEOUS
  Administered 2019-09-08: 4 [IU] via SUBCUTANEOUS
  Administered 2019-09-09: 3 [IU] via SUBCUTANEOUS
  Administered 2019-09-09: 4 [IU] via SUBCUTANEOUS
  Administered 2019-09-09: 3 [IU] via SUBCUTANEOUS
  Administered 2019-09-10: 7 [IU] via SUBCUTANEOUS
  Administered 2019-09-10: 3 [IU] via SUBCUTANEOUS
  Administered 2019-09-11: 7 [IU] via SUBCUTANEOUS
  Administered 2019-09-12: 11 [IU] via SUBCUTANEOUS
  Administered 2019-09-13: 3 [IU] via SUBCUTANEOUS
  Administered 2019-09-13: 11 [IU] via SUBCUTANEOUS
  Administered 2019-09-14 – 2019-09-15 (×2): 4 [IU] via SUBCUTANEOUS
  Administered 2019-09-15: 3 [IU] via SUBCUTANEOUS
  Administered 2019-09-16 (×2): 4 [IU] via SUBCUTANEOUS
  Administered 2019-09-16: 15 [IU] via SUBCUTANEOUS
  Administered 2019-09-17: 12:00:00 11 [IU] via SUBCUTANEOUS
  Administered 2019-09-17: 3 [IU] via SUBCUTANEOUS
  Administered 2019-09-17: 7 [IU] via SUBCUTANEOUS
  Administered 2019-09-18: 4 [IU] via SUBCUTANEOUS
  Administered 2019-09-18: 3 [IU] via SUBCUTANEOUS

## 2019-09-08 MED ORDER — INSULIN ASPART 100 UNIT/ML ~~LOC~~ SOLN
0.0000 [IU] | Freq: Every day | SUBCUTANEOUS | Status: DC
  Administered 2019-09-08: 3 [IU] via SUBCUTANEOUS
  Administered 2019-09-09 – 2019-09-14 (×4): 2 [IU] via SUBCUTANEOUS
  Administered 2019-09-15: 3 [IU] via SUBCUTANEOUS
  Administered 2019-09-17: 2 [IU] via SUBCUTANEOUS

## 2019-09-08 MED ORDER — PNEUMOCOCCAL VAC POLYVALENT 25 MCG/0.5ML IJ INJ
0.5000 mL | INJECTION | INTRAMUSCULAR | Status: AC
  Administered 2019-09-09: 0.5 mL via INTRAMUSCULAR
  Filled 2019-09-08: qty 0.5

## 2019-09-08 MED ORDER — POLYETHYLENE GLYCOL 3350 17 G PO PACK
17.0000 g | PACK | Freq: Every day | ORAL | Status: DC
  Administered 2019-09-08 – 2019-09-17 (×10): 17 g via ORAL
  Filled 2019-09-08 (×10): qty 1

## 2019-09-08 MED ORDER — DEXAMETHASONE SODIUM PHOSPHATE 10 MG/ML IJ SOLN
6.0000 mg | Freq: Once | INTRAMUSCULAR | Status: AC
  Administered 2019-09-08: 04:00:00 6 mg via INTRAVENOUS
  Filled 2019-09-08: qty 1

## 2019-09-08 MED ORDER — CALCIUM CARBONATE-VITAMIN D 500-200 MG-UNIT PO TABS
1.0000 | ORAL_TABLET | Freq: Two times a day (BID) | ORAL | Status: DC
  Administered 2019-09-08 – 2019-09-18 (×21): 1 via ORAL
  Filled 2019-09-08 (×21): qty 1

## 2019-09-08 MED ORDER — TRAZODONE HCL 50 MG PO TABS
50.0000 mg | ORAL_TABLET | Freq: Every day | ORAL | Status: DC
  Administered 2019-09-08 – 2019-09-17 (×10): 50 mg via ORAL
  Filled 2019-09-08 (×9): qty 1

## 2019-09-08 MED ORDER — BOOST PO LIQD
237.0000 mL | Freq: Every day | ORAL | Status: DC | PRN
  Filled 2019-09-08: qty 237
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
## 2019-09-08 NOTE — ED Provider Notes (Signed)
10:43 PM  At shift change, care assumed from Dr. Maryan Rued.  We see her her note for full H&P.  In short, patient is a chronically ill 86 year old male who is wheelchair-bound presenting with cough, shortness of breath and general malaise.  Here in the emergency department he is Covid positive.  His chest x-ray shows a mild pneumonia however he is without respiratory distress.  Patient oxygen saturations 98% on room air and other lab work is reassuring.  At this time there is no admission criteria.  Patient does not have an oxygen requirement.  At home he has home health assistance however he reports they will not be able to come out and assist him now that he is Covid positive.  His wife is unable to assist him in any way.  Patient will need to be evaluated by case management for rehab or possible nursing home placement.  Home medications ordered.  Patient resting comfortably.  Will be evaluated in the morning by case management.   Physical Exam  BP (!) 156/75   Pulse 92   Temp 98.7 F (37.1 C) (Oral)   Resp (!) 25   SpO2 93%   Physical Exam Vitals and nursing note reviewed.  Constitutional:      Appearance: He is well-developed. He is ill-appearing.  HENT:     Head: Normocephalic.  Eyes:     General: No scleral icterus.    Conjunctiva/sclera: Conjunctivae normal.  Cardiovascular:     Rate and Rhythm: Normal rate.  Pulmonary:     Effort: Tachypnea and accessory muscle usage ( mild) present.     Breath sounds: Decreased breath sounds present.  Musculoskeletal:        General: Normal range of motion.     Cervical back: Normal range of motion.  Skin:    General: Skin is warm and dry.  Neurological:     Mental Status: He is alert.     ED Course/Procedures   Clinical Course as of Sep 07 348  Sun Sep 08, 2019  0239 Noted that patient was hypoxic to 89% around 1:30 AM.  SpO2(!): 89 % [HM]  0257 Discussed with Dr. Posey Pronto who will admit   [HM]    Clinical Course User Index [HM]  Beau Ramsburg, Gwenlyn Perking    .Critical Care Performed by: Abigail Butts, PA-C Authorized by: Abigail Butts, PA-C   Critical care provider statement:    Critical care time (minutes):  45   Critical care time was exclusive of:  Separately billable procedures and treating other patients and teaching time   Critical care was necessary to treat or prevent imminent or life-threatening deterioration of the following conditions:  Respiratory failure   Critical care was time spent personally by me on the following activities:  Discussions with consultants, evaluation of patient's response to treatment, examination of patient, ordering and performing treatments and interventions, ordering and review of laboratory studies, ordering and review of radiographic studies, pulse oximetry, re-evaluation of patient's condition, obtaining history from patient or surrogate and review of old charts   I assumed direction of critical care for this patient from another provider in my specialty: no      MDM   [name] was evaluated in Emergency Department on 09/08/2019 for the symptoms described in the history of present illness. He was evaluated in the context of the global COVID-19 pandemic, which necessitated consideration that the patient might be at risk for infection with the SARS-CoV-2 virus that causes COVID-19. Institutional protocols  and algorithms that pertain to the evaluation of patients at risk for COVID-19 are in a state of rapid change based on information released by regulatory bodies including the CDC and federal and state organizations. These policies and algorithms were followed during the patient's care in the ED.   2:39 AM Reevaluation of [name] by myself found him hypoxic in bed with oxygen saturations of 89-91% on room air.  He remains tachypneic.  Breath sounds are clear and equal.  Patient was placed on 1 L of oxygen with improvement to 94% with good pleth.  Chest x-ray  consistent with Covid pneumonia.  I personally evaluated these images.  His labs are reassuring.  Given patient's age, Covid positive status, Covid pneumonia and now hypoxia with new oxygen requirement he will need to be admitted.  Decadron given. COVID labs ordered.    Acute hypoxemic respiratory failure due to COVID-19 [facility])  COVID-19       Thoren Hosang, Gwenlyn Perking 09/08/19 0350    Ripley Fraise, MD 09/08/19 [phone]

## 2019-09-08 NOTE — Care Management (Addendum)
This is a 86 year old male with medical history significant for hypertension PE chronic pain on multiple back surgeries bedbound diabetes mellitus sleep apnea PTSD presented to the emergency department with cough feeling poorly and history of Covid exposure.  Patient was admitted this morning Patient seen and examined at bedside he did not have any complaints.  He is waiting to be discharged to SNF

## 2019-09-09 LAB — GLUCOSE, CAPILLARY
Glucose-Capillary: 137 mg/dL — ABNORMAL HIGH (ref 70–99)
Glucose-Capillary: 125 mg/dL — ABNORMAL HIGH (ref 70–99)
Glucose-Capillary: 181 mg/dL — ABNORMAL HIGH (ref 70–99)
Glucose-Capillary: 210 mg/dL — ABNORMAL HIGH (ref 70–99)

## 2019-09-09 LAB — FERRITIN: Ferritin: 211 ng/mL (ref 24–336)

## 2019-09-09 LAB — C-REACTIVE PROTEIN: CRP: 5.3 mg/dL — ABNORMAL HIGH (ref <1.0)

## 2019-09-09 NOTE — Progress Notes (Signed)
Pt not really eating because he says he does not like the food.

## 2019-09-09 NOTE — Plan of Care (Signed)
Continue current POC 

## 2019-09-09 NOTE — Progress Notes (Addendum)
PROGRESS NOTE    [name]  WNI:[id number] DOB: [birth year] DOA: 09/07/2019 PCP: [name], MD    Brief Narrative:  HPI: [name] is a 86 y.o. male veteran with medical history significant of hypertension, PE, chronic pain with multiple back surgeries, bedbound, diabetes mellitus, sleep apnea, PTSD presented to the ED for cough, feeling poorly and history of Covid exposure.  Patient had severe coughing bouts while providing history and therefore history from the patient was limited. Complains of cough, dyspnea and generally feeling unwell for the last 1 week.  Received a course of doxycycline and prednisone from PCP which transiently improved his symptoms but returned.  Appetite is poor.  Denies any fever, abdominal pain, nausea, vomiting, diarrhea.  He is bedbound and has a personal aide who comes in 6 days of the week.  ED Course: While in the ED, tested positive for COVID-19.  He was hypoxic with oxygen saturation in the high 80s and was placed on 1 L oxygen via nasal cannula.  His labs otherwise looked okay and he remained hemodynamically stable.  The patient did not feel comfortable going home as he would not have help at home given he is Covid positive.  Hospitalist called for admission to assist with placement as he does not feel comfortable going home.  Subjective Patient in no respiratory distress 96% on no oxygen Stable hemodynamically Assessment & Plan:   Principal Problem:   Pneumonia due to COVID-19 virus Active Problems:   Chronic back pain   Hypertension   PTSD (post-traumatic stress disorder)   Diabetes ([place])   Pulmonary emboli ([place])  COVID-19 pneumonia Was treated as an outpatient with prednisone and doxycycline Was not better came to the hospital for evaluation Not on oxygen at baseline.  Hypoxic requiring 1 L oxygen via nasal cannula.  On 09/08/2019 in the emergency room 09/09/2019 96% on room air On dexamethasone 6 mg for 10 days no  remdesivir per admission physician Mild haziness over right base that may represent infiltrate or atelectasis on chest x-ray.  Given afebrile, with no leukocytosis and negative procalcitonin, will hold off on antibiotics at this time. CRP, D-dimer mildly elevated. Multivitamins,  Consider remdesivir if oxygen requirement worsens Plan ABG in the morning  Hypertension/congestive heart failure Continue carvedilol, lisinopril, Imdur Hold torsemide given he looks dry on exam due to poor appetite Will reassess in the morning  PTSD Continue Seroquel, trazodone  Chronic pain Continue Robaxin  Deconditioning, immobility PT OT consult  OSA Noncompliant with CPAP  Type 2 diabetes mellitus Blood sugar less than 100 on BMP Hold Lantus for now Continue sliding scale insulin If blood glucose becomes elevated, consider adding insulin Lantus  History of PE Not anticoagulated CT angio Negative for PE   He is bedbound and has a personal aide who comes in 6 days of the week.   He is waiting to be discharged to SNF  DVT prophylaxis: Lovenox Code Status: DNR Family Communication: Not today Disposition Plan: To be determined (TOC consult placed to assist with placement)  Consultants:   None   Procedures: No   Antimicrobials: No   Subjective: No respiratory distress on no oxygen    Objective: Vitals:   09/08/19 1440 09/08/19 2110 09/09/19 0518 09/09/19 1428  BP: 132/70 (!) 151/72 (!) 144/74 (!) 146/83  Pulse: 80 87 79 83  Resp: 18 20 20 16   Temp: 98.3 F (36.8 C) 97.8 F (36.6 C) 97.9 F (36.6 C) 98.3 F (36.8 C)  TempSrc: Oral Oral Oral Oral  SpO2: 91% 92% 92% 96%  Height:        Intake/Output Summary (Last 24 hours) at 09/09/2019 2117 Last data filed at 09/09/2019 1800 Gross per 24 hour  Intake 480 ml  Output 635 ml  Net -155 ml   There were no vitals filed for this visit.  Examination:  General exam: Appears calm and comfortable  Respiratory  system: Clear to auscultation. Respiratory effort normal. Cardiovascular system: S1 & S2 heard, RRR. No JVD, murmurs, rubs, gallops or clicks. No pedal edema. Gastrointestinal system: Abdomen is nondistended, soft and nontender. No organomegaly or masses felt. Normal bowel sounds heard. Central nervous system: Alert and oriented. No focal neurological deficits. Extremities: Symmetric 5 x 5 power. Skin: No rashes, lesions or ulcers Psychiatry: Judgement and insight appear normal. Mood & affect appropriate.     Data Reviewed: I have personally reviewed following labs and imaging studies  CBC: Recent Labs  Lab 09/07/19 1702  WBC 5.4  NEUTROABS 3.4  HGB 12.6*  HCT 41.1  MCV 93.2  PLT 354   Basic Metabolic Panel: Recent Labs  Lab 09/07/19 1702  NA 138  K 3.9  CL 100  CO2 29  GLUCOSE 97  BUN 17  CREATININE 0.60*  CALCIUM 8.6*   GFR: CrCl cannot be calculated (Unknown ideal weight.). Liver Function Tests: Recent Labs  Lab 09/07/19 1702  AST 23  ALT 21  ALKPHOS 88  BILITOT 1.0  PROT 6.3*  ALBUMIN 3.0*   No results for input(s): LIPASE, AMYLASE in the last 168 hours. No results for input(s): AMMONIA in the last 168 hours. Coagulation Profile: No results for input(s): INR, PROTIME in the last 168 hours. Cardiac Enzymes: No results for input(s): CKTOTAL, CKMB, CKMBINDEX, TROPONINI in the last 168 hours. BNP (last 3 results) No results for input(s): PROBNP in the last 8760 hours. HbA1C: Recent Labs    09/08/19 0809  HGBA1C 6.0*   CBG: Recent Labs  Lab 09/08/19 1639 09/08/19 2122 09/09/19 0811 09/09/19 1147 09/09/19 1730  GLUCAP 195* 264* 137* 125* 181*   Lipid Profile: Recent Labs    09/08/19 0343  TRIG 86   Thyroid Function Tests: No results for input(s): TSH, T4TOTAL, FREET4, T3FREE, THYROIDAB in the last 72 hours. Anemia Panel: Recent Labs    09/08/19 0343 09/09/19 0240  FERRITIN 177 211   Sepsis Labs: Recent Labs  Lab 09/08/19 0343   PROCALCITON <0.10  LATICACIDVEN 0.6    Recent Results (from the past 240 hour(s))  Blood Culture (routine x 2)     Status: None (Preliminary result)   Collection Time: 09/08/19  3:43 AM   Specimen: BLOOD  Result Value Ref Range Status   Specimen Description   Final    BLOOD RIGHT ARM Performed at [facility] [address], [place], [place] [address]    Special Requests   Final    BOTTLES DRAWN AEROBIC AND ANAEROBIC Blood Culture adequate volume Performed at [facility] [address], [place], [place] [address]    Culture   Final    NO GROWTH < 24 HOURS Performed at [facility] [address] [place], [place] [address]    Report Status PENDING  Incomplete  Blood Culture (routine x 2)     Status: None (Preliminary result)   Collection Time: 09/08/19  3:43 AM   Specimen: BLOOD  Result Value Ref Range Status   Specimen Description   Final  BLOOD LEFT ARM Performed at [facility] [place] [address], [place], [place] [address]    Special Requests   Final    BOTTLES DRAWN AEROBIC ONLY Blood Culture adequate volume Performed at [facility] [address], [place], [place] [address]    Culture   Final    NO GROWTH < 24 HOURS Performed at [facility] [address] [place], [place] [address]    Report Status PENDING  Incomplete         Radiology Studies: No results found.      Scheduled Meds: . ascorbic acid  1,000 mg Oral Daily  . calcium-vitamin D  1 tablet Oral BID  . carvedilol  3.125 mg Oral BID  . cholecalciferol  2,000 Units Oral BID  . dexamethasone  6 mg Oral Daily  . enoxaparin (LOVENOX) injection  60 mg Subcutaneous Q24H  . galantamine  16 mg Oral Q breakfast  . insulin aspart  0-20 Units Subcutaneous TID WC  . insulin aspart  0-5 Units Subcutaneous QHS  . iron polysaccharides  150 mg Oral Daily  . isosorbide mononitrate  30 mg Oral Daily  .  lisinopril  20 mg Oral Daily  . magnesium oxide  400 mg Oral BID  . Melatonin  5 mg Oral QHS  . methenamine  1 g Oral BID  . methocarbamol  750 mg Oral TID  . polyethylene glycol  17 g Oral Daily  . pravastatin  80 mg Oral QHS  . QUEtiapine  50 mg Oral QHS  . traZODone  50 mg Oral QHS  . vitamin B-12  500 mcg Oral BID   Continuous Infusions:   LOS: 1 day    Time spent: 35 minutes    Christella App Nani Gasser, MD [facility] Pager [phone]  If 7PM-7AM, please contact night-coverage [email or web address] Password [facility] 09/09/2019, 9:17 PM

## 2019-09-10 LAB — BLOOD GAS, ARTERIAL
Acid-Base Excess: 5.9 mmol/L — ABNORMAL HIGH (ref 0.0–2.0)
Bicarbonate: 30.3 mmol/L — ABNORMAL HIGH (ref 20.0–28.0)
Drawn by: 441261
FIO2: 21
O2 Saturation: 88.8 %
Patient temperature: 98.6
pCO2 arterial: 44.7 mmHg (ref 32.0–48.0)
pH, Arterial: 7.446 (ref 7.350–7.450)
pO2, Arterial: 57.2 mmHg — ABNORMAL LOW (ref 83.0–108.0)

## 2019-09-10 LAB — COMPREHENSIVE METABOLIC PANEL
ALT: 32 U/L (ref 0–44)
AST: 31 U/L (ref 15–41)
Albumin: 3 g/dL — ABNORMAL LOW (ref 3.5–5.0)
Alkaline Phosphatase: 91 U/L (ref 38–126)
Anion gap: 8 (ref 5–15)
BUN: 22 mg/dL (ref 8–23)
CO2: 29 mmol/L (ref 22–32)
Calcium: 8.8 mg/dL — ABNORMAL LOW (ref 8.9–10.3)
Chloride: 101 mmol/L (ref 98–111)
Creatinine, Ser: 0.74 mg/dL (ref 0.61–1.24)
GFR calc Af Amer: >60 mL/min (ref >60)
GFR calc non Af Amer: >60 mL/min (ref >60)
Glucose, Bld: 164 mg/dL — ABNORMAL HIGH (ref 70–99)
Potassium: 4.3 mmol/L (ref 3.5–5.1)
Sodium: 138 mmol/L (ref 135–145)
Total Bilirubin: 0.7 mg/dL (ref 0.3–1.2)
Total Protein: 6.1 g/dL — ABNORMAL LOW (ref 6.5–8.1)

## 2019-09-10 LAB — CBC
HCT: 36.6 % — ABNORMAL LOW (ref 39.0–52.0)
Hemoglobin: 11.5 g/dL — ABNORMAL LOW (ref 13.0–17.0)
MCH: 28.5 pg (ref 26.0–34.0)
MCHC: 31.4 g/dL (ref 30.0–36.0)
MCV: 90.6 fL (ref 80.0–100.0)
Platelets: 296 10*3/uL (ref 150–400)
RBC: 4.04 MIL/uL — ABNORMAL LOW (ref 4.22–5.81)
RDW: 13.6 % (ref 11.5–15.5)
WBC: 6.3 10*3/uL (ref 4.0–10.5)
nRBC: 0 % (ref 0.0–0.2)

## 2019-09-10 LAB — GLUCOSE, CAPILLARY
Glucose-Capillary: 145 mg/dL — ABNORMAL HIGH (ref 70–99)
Glucose-Capillary: 119 mg/dL — ABNORMAL HIGH (ref 70–99)
Glucose-Capillary: 240 mg/dL — ABNORMAL HIGH (ref 70–99)
Glucose-Capillary: 206 mg/dL — ABNORMAL HIGH (ref 70–99)

## 2019-09-10 NOTE — Progress Notes (Signed)
ABG obtained per Order and sent to lab @ [phone]; lab made aware of incoming sample.

## 2019-09-10 NOTE — TOC Progression Note (Signed)
CSW continuing to work on trying to find Covid + SNF bed for patient.  Jones Broom. [place], MSW, LCSW [phone]  09/10/2019 6:25 PM

## 2019-09-10 NOTE — Progress Notes (Addendum)
PROGRESS NOTE    [name]  NKN:[id number] DOB: 22-Jun-1932 DOA: 09/07/2019 PCP: [name], MD    Brief Narrative: ) [id number] J Melvinis a 86 y.o.maleveteranwith medical history significant ofhypertension, PE, chronic pain with multiple back surgeries, bedbound, diabetes mellitus, sleep apnea, PTSD presented to the ED for cough, feeling poorly and history of Covid exposure.  Patient had severe coughing bouts while providing history and therefore history from the patient was limited. Complains of cough, dyspnea and generally feeling unwell for the last 1 week. Received a course of doxycycline and prednisone from PCP which transiently improved his symptoms but returned. Appetite is poor. Denies any fever, abdominal pain, nausea, vomiting, diarrhea. He is bedbound and has a personal aide who comes in 6 days of the week.  ED Course:While in the ED, tested positive for COVID-19. He was hypoxic with oxygen saturation in the high 80s and was placed on 1 L oxygen via nasal cannula. His labs otherwise looked okay and he remained hemodynamically stable. The patient did not feel comfortable going home as he would not have help at home given he is Covid positive. Hospitalist called for admission to assist with placement as he does not feel comfortable going home.  Subjective Patient in no respiratory distress 91% on no oxygen as for ABG titrate oxygen keep saturation more than 92 Stable hemodynamically   Assessment & Plan:   Principal Problem:   Pneumonia due to COVID-19 virus Active Problems:   Chronic back pain   Hypertension   PTSD (post-traumatic stress disorder)   Diabetes ([place])   Pulmonary emboli ([place])  COVID-19 pneumonia Was treated as an outpatient with prednisone and doxycycline Was not better came to the hospital for evaluation Not on oxygen at baseline. Hypoxic requiring 1 L oxygen via nasal cannula.  On 09/08/2019 in the emergency room 09/09/2019  96% on room air On dexamethasone 6 mg for 10 days no remdesivir per admission physician Mild haziness over right base that may represent infiltrate or atelectasis on chest x-ray. Given afebrile, with no leukocytosis and negative procalcitonin, will hold off on antibiotics at this time. CRP, D-dimer mildly elevated. Multivitamins,  Consider remdesivir if oxygen requirement worsens Plan ABG in the morning  Hypertension/congestive heart failure Continue carvedilol, lisinopril, Imdur Hold torsemide given he looks dry on exam due to poor appetite Will reassess in the morning  PTSD Continue Seroquel, trazodone  Chronic pain Continue Robaxin  Deconditioning, immobility PT OT consult  OSA Noncompliant with CPAP  Type 2 diabetes mellitus Blood sugar less than 100 on BMP Hold Lantus for now Continue sliding scale insulin If blood glucose becomes elevated, consider adding insulin Lantus  History of PE Not anticoagulated CT angio Negative for PE    DVT prophylaxis: Lovenox Code Status: DNR Family Communication: Not today Disposition Plan:To be determined(TOC consult placed to assist with placement) CSW continuing to work on trying to find Covid + SNF bed for patient.  Subjective: Patient in mild respiratory distress No other complaints Encouraged to prone positions  Objective: Vitals:   09/09/19 2234 09/10/19 0507 09/10/19 0802 09/10/19 1325  BP:  (!) 141/67  126/66  Pulse:  71  79  Resp:  19  20  Temp:  98.3 F (36.8 C)  98.2 F (36.8 C)  TempSrc:  Oral  Oral  SpO2:  92% (S) (!) 88% 94%  Weight: 120.7 kg     Height: 5\' 11"  (1.803 m)       Intake/Output Summary (Last 24 hours)  at 09/10/2019 1859 Last data filed at 09/10/2019 1825 Gross per 24 hour  Intake 480 ml  Output 1400 ml  Net -920 ml   Filed Weights   09/09/19 2234  Weight: 120.7 kg    Examination:  General exam: Appears calm and comfortable  Respiratory system: rales. Respiratory  effort normal. Cardiovascular system: S1 & S2 heard, RRR. No JVD, murmurs, rubs, gallops or clicks. No pedal edema. Gastrointestinal system: Abdomen is nondistended, soft and nontender. No organomegaly or masses felt. Normal bowel sounds heard. Central nervous system: Alert and oriented. No focal neurological deficits. Extremities: Symmetric 5 x 5 power. Skin: No rashes, lesions or ulcers Psychiatry: Judgement and insight appear normal. Mood & affect appropriate.     Data Reviewed: I have personally reviewed following labs and imaging studies  CBC: Recent Labs  Lab 09/07/19 1702 09/10/19 0536  WBC 5.4 6.3  NEUTROABS 3.4  --   HGB 12.6* 11.5*  HCT 41.1 36.6*  MCV 93.2 90.6  PLT 218 191   Basic Metabolic Panel: Recent Labs  Lab 09/07/19 1702 09/10/19 0536  NA 138 138  K 3.9 4.3  CL 100 101  CO2 29 29  GLUCOSE 97 164*  BUN 17 22  CREATININE 0.60* 0.74  CALCIUM 8.6* 8.8*   GFR: Estimated Creatinine Clearance: 89.3 mL/min (by C-G formula based on SCr of 0.74 mg/dL). Liver Function Tests: Recent Labs  Lab 09/07/19 1702 09/10/19 0536  AST 23 31  ALT 21 32  ALKPHOS 88 91  BILITOT 1.0 0.7  PROT 6.3* 6.1*  ALBUMIN 3.0* 3.0*   No results for input(s): LIPASE, AMYLASE in the last 168 hours. No results for input(s): AMMONIA in the last 168 hours. Coagulation Profile: No results for input(s): INR, PROTIME in the last 168 hours. Cardiac Enzymes: No results for input(s): CKTOTAL, CKMB, CKMBINDEX, TROPONINI in the last 168 hours. BNP (last 3 results) No results for input(s): PROBNP in the last 8760 hours. HbA1C: Recent Labs    09/08/19 0809  HGBA1C 6.0*   CBG: Recent Labs  Lab 09/09/19 1730 09/09/19 2217 09/10/19 0734 09/10/19 1117 09/10/19 1628  GLUCAP 181* 210* 145* 119* 240*   Lipid Profile: Recent Labs    09/08/19 0343  TRIG 86   Thyroid Function Tests: No results for input(s): TSH, T4TOTAL, FREET4, T3FREE, THYROIDAB in the last 72 hours. Anemia  Panel: Recent Labs    09/08/19 0343 09/09/19 0240  FERRITIN 177 211   Sepsis Labs: Recent Labs  Lab 09/08/19 0343  PROCALCITON <0.10  LATICACIDVEN 0.6    Recent Results (from the past 240 hour(s))  Blood Culture (routine x 2)     Status: None (Preliminary result)   Collection Time: 09/08/19  3:43 AM   Specimen: BLOOD  Result Value Ref Range Status   Specimen Description   Final    BLOOD RIGHT ARM Performed at [facility] [address] [place], [place] [address]    Special Requests   Final    BOTTLES DRAWN AEROBIC AND ANAEROBIC Blood Culture adequate volume Performed at [facility] [address], [place], [place] [address]    Culture   Final    NO GROWTH 2 DAYS Performed at [facility] [address] [place], [place] [address]    Report Status PENDING  Incomplete  Blood Culture (routine x 2)     Status: None (Preliminary result)   Collection Time: 09/08/19  3:43 AM   Specimen: BLOOD  Result Value  Ref Range Status   Specimen Description   Final    BLOOD LEFT ARM Performed at [facility] [address], [place], [place] [address]    Special Requests   Final    BOTTLES DRAWN AEROBIC ONLY Blood Culture adequate volume Performed at [facility] [address], [place], [place] [address]    Culture   Final    NO GROWTH 2 DAYS Performed at [facility] [address], [place], [place] [address]    Report Status PENDING  Incomplete         Radiology Studies: No results found.      Scheduled Meds: . ascorbic acid  1,000 mg Oral Daily  . calcium-vitamin D  1 tablet Oral BID  . carvedilol  3.125 mg Oral BID  . cholecalciferol  2,000 Units Oral BID  . dexamethasone  6 mg Oral Daily  . enoxaparin (LOVENOX) injection  60 mg Subcutaneous Q24H  . galantamine  16 mg Oral Q breakfast  . insulin aspart  0-20 Units Subcutaneous TID WC  . insulin aspart  0-5 Units  Subcutaneous QHS  . iron polysaccharides  150 mg Oral Daily  . isosorbide mononitrate  30 mg Oral Daily  . lisinopril  20 mg Oral Daily  . magnesium oxide  400 mg Oral BID  . Melatonin  5 mg Oral QHS  . methenamine  1 g Oral BID  . methocarbamol  750 mg Oral TID  . polyethylene glycol  17 g Oral Daily  . pravastatin  80 mg Oral QHS  . QUEtiapine  50 mg Oral QHS  . traZODone  50 mg Oral QHS  . vitamin B-12  500 mcg Oral BID   Continuous Infusions:   LOS: 2 days    Time spent: 35 minutes    Kesa Birky Nani Gasser, MD [facility] Pager [phone]  If 7PM-7AM, please contact night-coverage [email or web address] Password [facility] 09/10/2019, 6:59 PM

## 2019-09-11 ENCOUNTER — Telehealth: Payer: Self-pay | Admitting: Family Medicine

## 2019-09-11 DIAGNOSIS — J9601 Acute respiratory failure with hypoxia: Secondary | ICD-10-CM

## 2019-09-11 DIAGNOSIS — J9691 Respiratory failure, unspecified with hypoxia: Secondary | ICD-10-CM

## 2019-09-11 LAB — GLUCOSE, CAPILLARY
Glucose-Capillary: 120 mg/dL — ABNORMAL HIGH (ref 70–99)
Glucose-Capillary: 121 mg/dL — ABNORMAL HIGH (ref 70–99)
Glucose-Capillary: 219 mg/dL — ABNORMAL HIGH (ref 70–99)
Glucose-Capillary: 176 mg/dL — ABNORMAL HIGH (ref 70–99)

## 2019-09-11 MED ORDER — SODIUM CHLORIDE 0.9% IV SOLUTION: Freq: Once | INTRAVENOUS | Status: DC

## 2019-09-11 MED ORDER — SODIUM CHLORIDE 0.9 % IV SOLN
200.0000 mg | Freq: Once | INTRAVENOUS | Status: AC
  Administered 2019-09-11: 200 mg via INTRAVENOUS
  Filled 2019-09-11: qty 200

## 2019-09-11 MED ORDER — SODIUM CHLORIDE 0.9 % IV SOLN
100.0000 mg | Freq: Every day | INTRAVENOUS | Status: AC
  Administered 2019-09-12 – 2019-09-15 (×4): 100 mg via INTRAVENOUS
  Filled 2019-09-11 (×4): qty 100

## 2019-09-11 MED ORDER — INSULIN GLARGINE 100 UNIT/ML ~~LOC~~ SOLN
5.0000 [IU] | Freq: Every day | SUBCUTANEOUS | Status: DC
  Administered 2019-09-11 – 2019-09-17 (×7): 5 [IU] via SUBCUTANEOUS
  Filled 2019-09-11 (×9): qty 0.05

## 2019-09-11 NOTE — Plan of Care (Signed)
Continue current POC 

## 2019-09-11 NOTE — Telephone Encounter (Signed)
I have placed orders from [facility] in the bin up front with a charge sheet

## 2019-09-11 NOTE — Telephone Encounter (Signed)
Paperwork given to PCP for completion.  

## 2019-09-11 NOTE — Care Management Important Message (Signed)
Important Message  Patient Details IM Letter given to Evette Cristal SW Case Manager to present to the Patient Name: [name] MRN: [id number] Date of Birth: October 26, 1931   Medicare Important Message Given:  Yes     Kerin Salen 09/11/2019, 11:30 AM

## 2019-09-11 NOTE — NC FL2 (Signed)
[place] LEVEL OF CARE SCREENING TOOL     IDENTIFICATION  Patient Name: [name] Birthdate: July 02, 1932 Sex: male Admission Date (Current Location): 09/07/2019  [facility] and [place] Number:  Herbalist and Address:  [facility],  [place] [address] [place]      Provider Number:    Attending Physician Name and Address:  Charlynne Cousins, MD  Relative Name and Phone Number:  Maejor, Erven [phone]  [phone] and Tresa Garter [phone]  [phone] and Allred,Beverley Daughter [phone]    Current Level of Care: Hospital Recommended Level of Care: [place] Prior Approval Number:    Date Approved/Denied:   PASRR Number: [id number]  Discharge Plan: SNF    Current Diagnoses: Patient Active Problem List   Diagnosis Date Noted  . Respiratory failure with hypoxia ([place]) 09/11/2019  . Pneumonia due to COVID-19 virus 09/08/2019  . Pulmonary emboli ([place]) 09/08/2019  . Renal insufficiency 06/24/2019  . Dependent for transfer from bed to chair 06/24/2019  . Anemia 06/24/2019  . Recurrent UTI 05/07/2019  . Dyspnea 09/16/2017  . Wheelchair confinement status 07/18/2017  . Bilateral lower extremity edema 07/18/2017  . Cholecystitis 01/16/2017  . Depression 08/15/2016  . GERD (gastroesophageal reflux disease) 08/15/2016  . HLD (hyperlipidemia) 08/15/2016  . Abdominal pain 08/15/2016  . Elevated liver function tests   . Closed right fibular fracture 07/27/2016  . Emphysematous cholecystitis s/p perc cholecystostomy drain 07/21/2016 07/20/2016  . Pressure injury of skin 07/20/2016  . Hx of pulmonary embolus 07/20/2016  . Hyponatremia 07/20/2016  . Urinary retention   . Diabetes ([place])   . PTSD (post-traumatic stress disorder) 11/30/2015  . Chronic pain syndrome 11/30/2015  . Morbid obesity due to excess calories ([place]) 11/30/2015  . Nocturia more than twice per night 11/30/2015  .  Closed displaced bimalleolar fracture of right ankle 02/20/2015  . Duodenal ulcer, perforated s/p repair/omental patch 11/26/2012  . Helicobacter pylori gastritis 11/26/2012  . Hypotension 11/16/2012  . Leukocytosis 11/12/2012  . Lumbosacral radiculopathy at L5 11/12/2012  . Herniated lumbar intervertebral disc 11/12/2012  . Hypertension 11/12/2012  . Unspecified constipation 11/12/2012  . History of back surgery 11/09/2012  . Chronic back pain 11/09/2012  . Obesity 11/09/2012    Orientation RESPIRATION BLADDER Height & Weight     Self, Time, Situation, Place  O2(2L) Incontinent Weight: 266 lb 1.5 oz (120.7 kg) Height:  5\' 11"  (180.3 cm)  BEHAVIORAL SYMPTOMS/MOOD NEUROLOGICAL BOWEL NUTRITION STATUS      Continent Diet(Carb Modified)  AMBULATORY STATUS COMMUNICATION OF NEEDS Skin   Limited Assist Verbally Normal                       Personal Care Assistance Level of Assistance  Bathing, Feeding, Dressing Bathing Assistance: Limited assistance Feeding assistance: Independent Dressing Assistance: Limited assistance     Functional Limitations Info  Sight, Hearing, Speech Sight Info: Adequate Hearing Info: Adequate Speech Info: Adequate    SPECIAL CARE FACTORS FREQUENCY  PT (By licensed PT), OT (By licensed OT)     PT Frequency: Minimum 5x a week OT Frequency: Minimum 5x a week            Contractures Contractures Info: Not present    Additional Factors Info  Code Status, Allergies, Psychotropic, Insulin Sliding Scale, Isolation Precautions Code Status Info: DNR Allergies Info: Oxycontin Psychotropic Info: QUEtiapine (SEROQUEL) tablet 50 mg Insulin Sliding Scale Info: insulin aspart (novoLOG) injection 0-20 Units 3x a  day with meals Isolation Precautions Info: Covid+  Air/Con precautions     Current Medications (09/11/2019):  This is the current hospital active medication list Current Facility-Administered Medications  Medication Dose Route Frequency  Provider Last Rate Last Admin  . 0.9 %  sodium chloride infusion (Manually program via Guardrails IV Fluids)   Intravenous Once Aileen Fass, Tammi Klippel, MD      . acetaminophen (TYLENOL) tablet 500 mg  500 mg Oral Q8H PRN Lucky Cowboy, MD      . albuterol (VENTOLIN HFA) 108 (90 Base) MCG/ACT inhaler 6 puff  6 puff Inhalation Q4H PRN Lucky Cowboy, MD   6 puff at 09/08/19 0358  . ascorbic acid (VITAMIN C) tablet 1,000 mg  1,000 mg Oral Daily Lucky Cowboy, MD   1,000 mg at 09/10/19 0951  . calcium-vitamin D (OSCAL WITH D) 500-200 MG-UNIT per tablet 1 tablet  1 tablet Oral BID Lucky Cowboy, MD   1 tablet at 09/11/19 1021  . carvedilol (COREG) tablet 3.125 mg  3.125 mg Oral BID Lucky Cowboy, MD   3.125 mg at 09/11/19 1021  . cholecalciferol (VITAMIN D3) tablet 2,000 Units  2,000 Units Oral BID Lucky Cowboy, MD   2,000 Units at 09/11/19 1018  . dexamethasone (DECADRON) tablet 6 mg  6 mg Oral Daily Lucky Cowboy, MD   6 mg at 09/11/19 1020  . diclofenac sodium (VOLTAREN) 1 % transdermal gel 2 g  2 g Topical QID PRN Lucky Cowboy, MD      . enoxaparin (LOVENOX) injection 60 mg  60 mg Subcutaneous Q24H Lucky Cowboy, MD   60 mg at 09/10/19 0949  . galantamine (RAZADYNE ER) 24 hr capsule 16 mg  16 mg Oral Q breakfast Lucky Cowboy, MD   16 mg at 09/11/19 0842  . insulin aspart (novoLOG) injection 0-20 Units  0-20 Units Subcutaneous TID WC Lucky Cowboy, MD   7 Units at 09/10/19 1645  . insulin aspart (novoLOG) injection 0-5 Units  0-5 Units Subcutaneous QHS Lucky Cowboy, MD   2 Units at 09/10/19 2238  . insulin glargine (LANTUS) injection 5 Units  5 Units Subcutaneous QHS Charlynne Cousins, MD      . iron polysaccharides (NIFEREX) capsule 150 mg  150 mg Oral Daily Lucky Cowboy, MD   150 mg at 09/10/19 0951  . isosorbide mononitrate (IMDUR) 24 hr tablet 30 mg  30 mg Oral Daily Lucky Cowboy, MD   30 mg at 09/11/19 1021  . lactose free nutrition (Boost) liquid 237 mL  237 mL Oral Daily PRN  Lucky Cowboy, MD      . lisinopril (ZESTRIL) tablet 20 mg  20 mg Oral Daily Lucky Cowboy, MD   20 mg at 09/11/19 1019  . magnesium oxide (MAG-OX) tablet 400 mg  400 mg Oral BID Lucky Cowboy, MD   400 mg at 09/10/19 2200  . Melatonin TABS 5 mg  5 mg Oral QHS Lucky Cowboy, MD   5 mg at 09/10/19 2200  . methenamine (MANDELAMINE) tablet 1 g  1 g Oral BID Lucky Cowboy, MD   1 g at 09/10/19 2202  . methocarbamol (ROBAXIN) tablet 750 mg  750 mg Oral TID Lucky Cowboy, MD   750 mg at 09/11/19 1022  . polyethylene glycol (MIRALAX / GLYCOLAX) packet 17 g  17 g Oral Daily Lucky Cowboy, MD   17 g at 09/10/19 1646  .  pravastatin (PRAVACHOL) tablet 80 mg  80 mg Oral QHS Lucky Cowboy, MD   80 mg at 09/10/19 2200  . QUEtiapine (SEROQUEL) tablet 50 mg  50 mg Oral QHS Lucky Cowboy, MD   50 mg at 09/10/19 2200  . remdesivir 200 mg in sodium chloride 0.9% 250 mL IVPB  200 mg Intravenous Once Thomes Lolling, RPH       Followed by  . [START ON 09/12/2019] remdesivir 100 mg in sodium chloride 0.9 % 100 mL IVPB  100 mg Intravenous Daily Thomes Lolling, RPH      . traZODone (DESYREL) tablet 50 mg  50 mg Oral QHS Lucky Cowboy, MD   50 mg at 09/10/19 2201  . vitamin B-12 (CYANOCOBALAMIN) tablet 500 mcg  500 mcg Oral BID Lucky Cowboy, MD   500 mcg at 09/10/19 2200     Discharge Medications: Please see discharge summary for a list of discharge medications.  Relevant Imaging Results:  Relevant Lab Results:   Additional Information SSN [id number]  Ross Ludwig, LCSW

## 2019-09-11 NOTE — Progress Notes (Signed)
[facility] PROGRESS NOTE    Progress Note  AZIM GILLINGHAM  HMC:[id number] DOB: 05/15/1932 DOA: 09/07/2019 PCP: [name], MD     Brief Narrative:   [name] is an 86 y.o. male past medical history significant for hypertension, PE, chronic pain with multiple back surgeries diabetes mellitus type 2 PTSD presents to the ED with cough and feeling weak was SARS-CoV-2 positive positive on 09/06/2018  Assessment/Plan:   Acute respiratory failure with hypoxia secondary to pneumonia due to COVID-19 virus: He started requiring oxygen this morning to just keep saturations greater than 92%. He was started on IV Decadron on 09/06/2018, his inflammatory markers are trending up.  I have personally reviewed his chest x-ray from 09/06/2018 and shows bilateral hazy infiltrates.  His procalcitonin was low yield on admission. We will start on IV remdesivir and convalescent plasma. The treatment plan and use of medications (convalescent plasma and IV Actemra) And known side effects were discussed with patient/family, they were clearly explained that there is no proven definitive treatment for COVID-19 infection, any medications used here are based on published clinical articles/anecdotal data which are not peer-reviewed or randomized control trials.  Complete risks and long-term side effects are unknown, however in the best clinical judgment they seem to be of some clinical benefit rather than medical risks.  Patient/family agree with the treatment plan and want to receive the given medications.  Essential hypertension: He does not have heart failure per previous 2D echo 1 in 2018 08/2017. His blood pressure is fairly controlled, continue Coreg, Imdur and lisinopril, his torsemide has been held.  PTSD: Continue Seroquel and trazodone.  Chronic pain: Continue Robaxin.  Deconditioning: Physical therapy has been consulted awaiting further recommendations.  Obstructive sleep apnea: Cannot  use CPAP at this point.  Controlled diabetes mellitus type 2: His Lantus was held dexamethasone which could make his blood glucose erratic, but his seems to be mild, his A1c is 6.0. Continue sliding scale insulin, will add low-dose long-acting insulin.  Stage I sacral decubitus ulcer present on admission RN Pressure Injury Documentation: Pressure Injury 07/20/16 Stage I -  Intact skin with non-blanchable redness of a localized area usually over a bony prominence. (Active)  07/20/16 1200  Location: Sacrum  Location Orientation: Medial  Staging: Stage I -  Intact skin with non-blanchable redness of a localized area usually over a bony prominence.  Wound Description (Comments):   Present on Admission:     Estimated body mass index is 37.11 kg/m as calculated from the following:   Height as of this encounter: 5\' 11"  (1.803 m).   Weight as of this encounter: 120.7 kg.  DVT prophylaxis: lovenox Family Communication:none Disposition Plan/Barrier to D/C: unable to determine Code Status:     Code Status Orders  (From admission, onward)         Start     Ordered   09/08/19 0430  Do not attempt resuscitation (DNR)  Continuous    Question Answer Comment  In the event of cardiac or respiratory ARREST Do not call a "code blue"   In the event of cardiac or respiratory ARREST Do not perform Intubation, CPR, defibrillation or ACLS   In the event of cardiac or respiratory ARREST Use medication by any route, position, wound care, and other measures to relive pain and suffering. May use oxygen, suction and manual treatment of airway obstruction as needed for comfort.      09/08/19 0429        Code  Status History    Date Active Date Inactive Code Status Order ID Comments User Context   05/07/2019 2148 05/10/2019 1750 Full Code [id number]  Mercy Riding, MD Inpatient   05/11/2018 2141 05/14/2018 1558 Full Code [id number]  Jani Gravel, MD Inpatient   09/19/2017 0940 09/22/2017 2100 Full Code [id number]   Donita Brooks, NP ED   09/14/2017 2334 09/16/2017 1759 Full Code [id number]  Damita Lack, MD ED   01/16/2017 1314 01/17/2017 1220 Full Code [id number]  Greer Pickerel, MD Inpatient   12/17/2016 1638 12/20/2016 2035 Full Code [id number]  Debbe Odea, MD ED   08/15/2016 0312 08/18/2016 1555 Full Code [id number]  Ivor Costa, MD ED   07/20/2016 2004 07/27/2016 2039 Full Code [id number]  Edwin Dada, MD Inpatient   06/04/2016 2217 06/08/2016 1823 DNR [id number]  Toy Baker, MD Inpatient   06/04/2016 2217 06/04/2016 2217 DNR [id number]  Toy Baker, MD Inpatient   02/11/2016 2347 02/15/2016 1849 Full Code [id number]  Etta Quill, DO ED   02/19/2015 0921 02/22/2015 1552 Full Code [id number]  Corky Sing, PA-C Inpatient   11/15/2012 2116 11/21/2012 2320 Full Code [id number]  [name], MD Inpatient   11/09/2012 1655 11/15/2012 2116 Full Code [id number]  Geradine Girt, DO Inpatient   Advance Care Planning Activity    Advance Directive Documentation     Most Recent Value  Type of Advance Directive  Living will, Healthcare Power of Attorney  Pre-existing out of facility DNR order (yellow form or pink MOST form)  --  "MOST" Form in Place?  --        IV Access:    Peripheral IV   Procedures and diagnostic studies:   No results found.   Medical Consultants:    None.  Anti-Infectives:   None  Subjective:    Doreene Eland he relates his breathing is unchanged compared to yesterday.  Objective:    Vitals:   09/10/19 0802 09/10/19 1325 09/10/19 2125 09/11/19 0509  BP:  126/66 (!) 141/71 119/67  Pulse:  79 77 79  Resp:  20 (!) 21 17  Temp:  98.2 F (36.8 C) 98.9 F (37.2 C) 98.6 F (37 C)  TempSrc:  Oral Oral Oral  SpO2: (S) (!) 88% 94% 95% 92%  Weight:      Height:       SpO2: 92 % O2 Flow Rate (L/min): 2 L/min   Intake/Output Summary (Last 24 hours) at 09/11/2019 0904 Last data filed at 09/11/2019 0514 Gross per 24 hour  Intake  480 ml  Output 900 ml  Net -420 ml   Filed Weights   09/09/19 2234  Weight: 120.7 kg    Exam: General exam: In no acute distress. Respiratory system: Good air movement and clear to auscultation. Cardiovascular system: S1 & S2 heard, RRR. No JVD. Gastrointestinal system: Abdomen is nondistended, soft and nontender.  Central nervous system: Alert and oriented. No focal neurological deficits. Extremities: No pedal edema. Skin: No rashes, lesions or ulcers Psychiatry: Judgement and insight appear normal. Mood & affect appropriate.  Data Reviewed:    Labs: Basic Metabolic Panel: Recent Labs  Lab 09/07/19 1702 09/10/19 0536  NA 138 138  K 3.9 4.3  CL 100 101  CO2 29 29  GLUCOSE 97 164*  BUN 17 22  CREATININE 0.60* 0.74  CALCIUM 8.6* 8.8*   GFR Estimated Creatinine Clearance: 89.3 mL/min (by C-G formula based on SCr of 0.74 mg/dL). Liver Function Tests: Recent Labs  Lab 09/07/19 1702 09/10/19 0536  AST 23 31  ALT 21 32  ALKPHOS 88 91  BILITOT 1.0 0.7  PROT 6.3* 6.1*  ALBUMIN 3.0* 3.0*   No results for input(s): LIPASE, AMYLASE in the last 168 hours. No results for input(s): AMMONIA in the last 168 hours. Coagulation profile No results for input(s): INR, PROTIME in the last 168 hours. COVID-19 Labs  Recent Labs    09/09/19 0240  FERRITIN 211  CRP 5.3*    Lab Results  Component Value Date   SARSCOV2NAA NEGATIVE 08/09/2019   [place] NEGATIVE 05/07/2019    CBC: Recent Labs  Lab 09/07/19 1702 09/10/19 0536  WBC 5.4 6.3  NEUTROABS 3.4  --   HGB 12.6* 11.5*  HCT 41.1 36.6*  MCV 93.2 90.6  PLT 218 296   Cardiac Enzymes: No results for input(s): CKTOTAL, CKMB, CKMBINDEX, TROPONINI in the last 168 hours. BNP (last 3 results) No results for input(s): PROBNP in the last 8760 hours. CBG: Recent Labs  Lab 09/10/19 0734 09/10/19 1117 09/10/19 1628 09/10/19 2215 09/11/19 0802  GLUCAP 145* 119* 240* 206* 120*   D-Dimer: No results for  input(s): DDIMER in the last 72 hours. Hgb A1c: No results for input(s): HGBA1C in the last 72 hours. Lipid Profile: No results for input(s): CHOL, HDL, LDLCALC, TRIG, CHOLHDL, LDLDIRECT in the last 72 hours. Thyroid function studies: No results for input(s): TSH, T4TOTAL, T3FREE, THYROIDAB in the last 72 hours.  Invalid input(s): FREET3 Anemia work up: Recent Labs    09/09/19 0240  FERRITIN 211   Sepsis Labs: Recent Labs  Lab 09/07/19 1702 09/08/19 0343 09/10/19 0536  PROCALCITON  --  <0.10  --   WBC 5.4  --  6.3  LATICACIDVEN  --  0.6  --    Microbiology Recent Results (from the past 240 hour(s))  Blood Culture (routine x 2)     Status: None (Preliminary result)   Collection Time: 09/08/19  3:43 AM   Specimen: BLOOD  Result Value Ref Range Status   Specimen Description   Final    BLOOD RIGHT ARM Performed at [facility], [place] [address], [place], [place] [address]    Special Requests   Final    BOTTLES DRAWN AEROBIC AND ANAEROBIC Blood Culture adequate volume Performed at [place] [address] [place], [place] [address]    Culture   Final    NO GROWTH 2 DAYS Performed at [facility] [address] [place], [place] [address]    Report Status PENDING  Incomplete  Blood Culture (routine x 2)     Status: None (Preliminary result)   Collection Time: 09/08/19  3:43 AM   Specimen: BLOOD  Result Value Ref Range Status   Specimen Description   Final    BLOOD LEFT ARM Performed at [facility] [address] [place], [place] [address]    Special Requests   Final    BOTTLES DRAWN AEROBIC ONLY Blood Culture adequate volume Performed at [facility] [address] [place], [place] [address]    Culture   Final    NO GROWTH 2 DAYS Performed at [facility] [address] [place], [place] [address]    Report Status PENDING  Incomplete     Medications:   .  ascorbic acid  1,000 mg Oral Daily  . calcium-vitamin D  1 tablet Oral BID  . carvedilol  3.125 mg Oral BID  .  cholecalciferol  2,000 Units Oral BID  . dexamethasone  6 mg Oral Daily  . enoxaparin (LOVENOX) injection  60 mg Subcutaneous Q24H  . galantamine  16 mg Oral Q breakfast  . insulin aspart  0-20 Units Subcutaneous TID WC  . insulin aspart  0-5 Units Subcutaneous QHS  . iron polysaccharides  150 mg Oral Daily  . isosorbide mononitrate  30 mg Oral Daily  . lisinopril  20 mg Oral Daily  . magnesium oxide  400 mg Oral BID  . Melatonin  5 mg Oral QHS  . methenamine  1 g Oral BID  . methocarbamol  750 mg Oral TID  . polyethylene glycol  17 g Oral Daily  . pravastatin  80 mg Oral QHS  . QUEtiapine  50 mg Oral QHS  . traZODone  50 mg Oral QHS  . vitamin B-12  500 mcg Oral BID   Continuous Infusions:    LOS: 3 days   Charlynne Cousins  [facility]  09/11/2019, 9:04 AM

## 2019-09-11 NOTE — Evaluation (Signed)
Physical Therapy Evaluation Patient Details Name: [name] MRN: [id number] DOB: 03/26/1932 Today's Date: 09/11/2019   History of Present Illness  86 yo male admitted with, falls, DM, R residual weakness, back sg, PE, PTSD  Clinical Impression  On eval, pt required Max assist for bed mobility. He participated as best he could on today but he is very weak currently. Discussed d/c plan-pt is agreeable to SNF.     Follow Up Recommendations SNF    Equipment Recommendations  None recommended by PT    Recommendations for Other Services       Precautions / Restrictions Precautions Precautions: Fall Precaution Comments: monitior O2 Restrictions Weight Bearing Restrictions: No      Mobility  Bed Mobility Overal bed mobility: Needs Assistance Bed Mobility: Rolling Rolling: Max assist         General bed mobility comments: Assist to roll towards R side. Pt utilized bedrail as best he could. He is very weak.  Transfers                 General transfer comment: NT-too weak today  Ambulation/Gait                Stairs            Wheelchair Mobility    Modified Rankin (Stroke Patients Only)       Balance                                             Pertinent Vitals/Pain Pain Assessment: No/denies pain    Home Living Family/patient expects to be discharged to:: Unsure Living Arrangements: Spouse/significant other Available Help at Discharge: Family   Home Access: Level entry     Home Layout: One level Home Equipment: Bedside commode;Wheelchair - power;Tub bench Additional Comments: lift    Prior Function Level of [place]: Needs assistance   Gait / Transfers Assistance Needed: WC transfers(mod squat pivot)-pt stated he was able to perform task with Mod Ind until becoming weak     Comments: [place] aide 3x a week, 3 hours each visit. Aide assists with bathing/dressing     Hand Dominance         Extremity/Trunk Assessment   Upper Extremity Assessment Upper Extremity Assessment: Generalized weakness    Lower Extremity Assessment Lower Extremity Assessment: Generalized weakness;RLE deficits/detail RLE Deficits / Details: R residual weakness       Communication   Communication: No difficulties  Cognition Arousal/Alertness: Awake/alert Behavior During Therapy: WFL for tasks assessed/performed Overall Cognitive Status: Within Functional Limits for tasks assessed                                        General Comments      Exercises     Assessment/Plan    PT Assessment Patient needs continued PT services  PT Problem List Decreased strength;Decreased mobility;Decreased balance;Decreased knowledge of use of DME;Decreased activity tolerance       PT Treatment Interventions DME instruction;Therapeutic activities;Therapeutic exercise;Functional mobility training;Balance training;Patient/family education    PT Goals (Current goals can be found in the Care Plan section)  Acute Rehab PT Goals Patient Stated Goal: to get better/stronger PT Goal Formulation: With patient Time For Goal Achievement: 09/25/19 Potential to Achieve Goals: Fair    Frequency Min  2X/week   Barriers to discharge        Co-evaluation               AM-PAC PT "6 Clicks" Mobility  Outcome Measure Help needed turning from your back to your side while in a flat bed without using bedrails?: Total Help needed moving from lying on your back to sitting on the side of a flat bed without using bedrails?: Total Help needed moving to and from a bed to a chair (including a wheelchair)?: Total Help needed standing up from a chair using your arms (e.g., wheelchair or bedside chair)?: Total Help needed to walk in hospital room?: Total Help needed climbing 3-5 steps with a railing? : Total 6 Click Score: 6    End of Session   Activity Tolerance: Patient limited by fatigue Patient  left: in bed;with call bell/phone within reach;with bed alarm set   PT Visit Diagnosis: Muscle weakness (generalized) (M62.81)    Time: 0100-7121 PT Time Calculation (min) (ACUTE ONLY): 8 min   Charges:   PT Evaluation $PT Eval Moderate Complexity: 1 Mod          Jhace Fennell P, PT Acute Rehabilitation

## 2019-09-11 NOTE — Consult Note (Signed)
   [facility] Inpatient Consult   09/11/2019  TEREK BEE [birth year] [id number]   Patient screened for potential [facility] Care Management services due to unplanned readmission risk score of 32%, extreme.  Per chart review, patient being followed by [facility]. [facility] CM will not follow.  Netta Cedars, MSN, [facility] Liaison Nurse Mobile Phone [phone]  Toll free office [phone]

## 2019-09-12 LAB — CBC WITH DIFFERENTIAL/PLATELET
Abs Immature Granulocytes: 0.02 10*3/uL (ref 0.00–0.07)
Basophils Absolute: 0 10*3/uL (ref 0.0–0.1)
Basophils Relative: 0 %
Eosinophils Absolute: 0 10*3/uL (ref 0.0–0.5)
Eosinophils Relative: 0 %
HCT: 39.2 % (ref 39.0–52.0)
Hemoglobin: 12.2 g/dL — ABNORMAL LOW (ref 13.0–17.0)
Immature Granulocytes: 0 %
Lymphocytes Relative: 22 %
Lymphs Abs: 1.3 10*3/uL (ref 0.7–4.0)
MCH: 28.4 pg (ref 26.0–34.0)
MCHC: 31.1 g/dL (ref 30.0–36.0)
MCV: 91.2 fL (ref 80.0–100.0)
Monocytes Absolute: 0.9 10*3/uL (ref 0.1–1.0)
Monocytes Relative: 15 %
Neutro Abs: 3.9 10*3/uL (ref 1.7–7.7)
Neutrophils Relative %: 63 %
Platelets: 277 10*3/uL (ref 150–400)
RBC: 4.3 MIL/uL (ref 4.22–5.81)
RDW: 14 % (ref 11.5–15.5)
WBC: 6.2 10*3/uL (ref 4.0–10.5)
nRBC: 0 % (ref 0.0–0.2)

## 2019-09-12 LAB — PREPARE FRESH FROZEN PLASMA

## 2019-09-12 LAB — BPAM FFP
Blood Product Expiration Date: 202101131605
ISSUE DATE / TIME: 202101131335
Unit Type and Rh: 6200

## 2019-09-12 LAB — GLUCOSE, CAPILLARY
Glucose-Capillary: 121 mg/dL — ABNORMAL HIGH (ref 70–99)
Glucose-Capillary: 111 mg/dL — ABNORMAL HIGH (ref 70–99)
Glucose-Capillary: 260 mg/dL — ABNORMAL HIGH (ref 70–99)
Glucose-Capillary: 168 mg/dL — ABNORMAL HIGH (ref 70–99)

## 2019-09-12 LAB — C-REACTIVE PROTEIN: CRP: 6 mg/dL — ABNORMAL HIGH (ref <1.0)

## 2019-09-12 LAB — BASIC METABOLIC PANEL
Anion gap: 8 (ref 5–15)
BUN: 24 mg/dL — ABNORMAL HIGH (ref 8–23)
CO2: 28 mmol/L (ref 22–32)
Calcium: 8.6 mg/dL — ABNORMAL LOW (ref 8.9–10.3)
Chloride: 101 mmol/L (ref 98–111)
Creatinine, Ser: 0.73 mg/dL (ref 0.61–1.24)
GFR calc Af Amer: >60 mL/min (ref >60)
GFR calc non Af Amer: >60 mL/min (ref >60)
Glucose, Bld: 133 mg/dL — ABNORMAL HIGH (ref 70–99)
Potassium: 4.4 mmol/L (ref 3.5–5.1)
Sodium: 137 mmol/L (ref 135–145)

## 2019-09-12 LAB — D-DIMER, QUANTITATIVE: D-Dimer, Quant: 1.99 ug/mL-FEU — ABNORMAL HIGH (ref 0.00–0.50)

## 2019-09-12 MED ORDER — GUAIFENESIN-DM 100-10 MG/5ML PO SYRP
5.0000 mL | ORAL_SOLUTION | ORAL | Status: DC | PRN
  Administered 2019-09-12 – 2019-09-13 (×2): 5 mL via ORAL
  Filled 2019-09-12 (×2): qty 10

## 2019-09-12 MED ORDER — TORSEMIDE 20 MG PO TABS
20.0000 mg | ORAL_TABLET | Freq: Two times a day (BID) | ORAL | Status: DC
  Administered 2019-09-12 – 2019-09-13 (×4): 20 mg via ORAL
  Filled 2019-09-12 (×5): qty 1

## 2019-09-12 NOTE — Consult Note (Signed)
   [facility] Inpatient Consult   09/12/2019  BABACAR HAYCRAFT 1932/02/04 [id number]    Follow-Up Note:   Following disposition of this Medicare/ Next[employer] patient with  32% extreme high risk score for unplanned readmission and hospitalization.   PT evaluation completed and patient is recommended to skilled nursing facility (SNF).  Transition of care SW note shows trying to find Covid + SNF bed for patient.  Confirmed (with Butch Penny) that patient is active with [facility] program. Although, [facility] not following patient at skilled nursing facility per [facility].  Plan: [facility] post acute RN coordinator will be made aware of patient's disposition to any facility covered by [facility] care management for post discharge follow up.   For questions and additional information, please contact:  Lashonta Pilling A. Hattie Pine, BSN, RN-BC [facility] Liaison Cell: [phone]

## 2019-09-12 NOTE — Progress Notes (Signed)
[facility] PROGRESS NOTE    Progress Note  [name]  ZCH:[id number] DOB: Oct 17, 1931 DOA: 09/07/2019 PCP: [name], MD     Brief Narrative:   [name] is an 86 y.o. male past medical history significant for hypertension, PE, chronic pain with multiple back surgeries diabetes mellitus type 2 PTSD presents to the ED with cough and feeling weak was SARS-CoV-2 positive positive on 09/06/2018  Assessment/Plan:   Acute respiratory failure with hypoxia secondary to pneumonia due to COVID-19 virus: [name] is only requiring 2 L of oxygen to keep saturations greater than 92%. He was started on IV remdesivir and steroids, his inflammatory markers are trending up this morning He did agree to convalescent plasma and was given on 09/11/2019. The patient is low yield he has not had any fevers has no leukocytosis. I had a long discussion with him about IV Actemra and he has agreed to it.The treatment plan and use of medications and known side effects were discussed with patient/family, they were clearly explained that there is no proven definitive treatment for COVID-19 infection, any medications used here are based on published clinical articles/anecdotal data which are not peer-reviewed or randomized control trials.  Complete risks and long-term side effects are unknown, however in the best clinical judgment they seem to be of some clinical benefit rather than medical risks.  Patient/family agree with the treatment plan and want to receive the given medications. We will continue to follow his CRP and his oxygen saturations and requirements closely.  Essential hypertension: Continue current regimen, will resume home torsemide as his blood pressure is trending up. He does not have heart failure per previous 2D echo 1 in 2018 08/2017. His blood pressure is fairly controlled, continue Coreg, Imdur and lisinopril, his torsemide has been held.  PTSD: Continue Seroquel and  trazodone.  Chronic pain: Continue Robaxin.  Deconditioning: Physical therapy has been consulted awaiting further recommendations.  Obstructive sleep apnea: Cannot use CPAP at this point.  Controlled diabetes mellitus type 2: Continue long-acting insulin plus sliding scale blood glucose is fairly controlled.  Stage I sacral decubitus ulcer present on admission RN Pressure Injury Documentation: Pressure Injury 07/20/16 Stage I -  Intact skin with non-blanchable redness of a localized area usually over a bony prominence. (Active)  07/20/16 1200  Location: Sacrum  Location Orientation: Medial  Staging: Stage I -  Intact skin with non-blanchable redness of a localized area usually over a bony prominence.  Wound Description (Comments):   Present on Admission:     Estimated body mass index is 37.11 kg/m as calculated from the following:   Height as of this encounter: 5\' 11"  (1.803 m).   Weight as of this encounter: 120.7 kg.  DVT prophylaxis: lovenox Family Communication:none Disposition Plan/Barrier to D/C: unable to determine Code Status:     Code Status Orders  (From admission, onward)         Start     Ordered   09/08/19 0430  Do not attempt resuscitation (DNR)  Continuous    Question Answer Comment  In the event of cardiac or respiratory ARREST Do not call a "code blue"   In the event of cardiac or respiratory ARREST Do not perform Intubation, CPR, defibrillation or ACLS   In the event of cardiac or respiratory ARREST Use medication by any route, position, wound care, and other measures to relive pain and suffering. May use oxygen, suction and manual treatment of airway obstruction as needed for comfort.  09/08/19 0429        Code Status History    Date Active Date Inactive Code Status Order ID Comments User Context   05/07/2019 2148 05/10/2019 1750 Full Code [id number]  Mercy Riding, MD Inpatient   05/11/2018 2141 05/14/2018 1558 Full Code [id number]  Jani Gravel,  MD Inpatient   09/19/2017 0940 09/22/2017 2100 Full Code [id number]  Donita Brooks, NP ED   09/14/2017 2334 09/16/2017 1759 Full Code [id number]  Damita Lack, MD ED   01/16/2017 1314 01/17/2017 1220 Full Code [id number]  Greer Pickerel, MD Inpatient   12/17/2016 1638 12/20/2016 2035 Full Code [id number]  Debbe Odea, MD ED   08/15/2016 0312 08/18/2016 1555 Full Code [id number]  Ivor Costa, MD ED   07/20/2016 2004 07/27/2016 2039 Full Code [id number]  Edwin Dada, MD Inpatient   06/04/2016 2217 06/08/2016 1823 DNR [id number]  Toy Baker, MD Inpatient   06/04/2016 2217 06/04/2016 2217 DNR [id number]  Toy Baker, MD Inpatient   02/11/2016 2347 02/15/2016 1849 Full Code [id number]  Etta Quill, DO ED   02/19/2015 0921 02/22/2015 1552 Full Code [id number]  Corky Sing, PA-C Inpatient   11/15/2012 2116 11/21/2012 2320 Full Code [id number]  [name], MD Inpatient   11/09/2012 1655 11/15/2012 2116 Full Code [id number]  Geradine Girt, DO Inpatient   Advance Care Planning Activity    Advance Directive Documentation     Most Recent Value  Type of Advance Directive  Living will, Healthcare Power of Attorney  Pre-existing out of facility DNR order (yellow form or pink MOST form)  --  "MOST" Form in Place?  --        IV Access:    Peripheral IV   Procedures and diagnostic studies:   No results found.   Medical Consultants:    None.  Anti-Infectives:   None  Subjective:    [name] relates his breathing is unchanged compared to yesterday.  Objective:    Vitals:   09/11/19 1515 09/11/19 2004 09/11/19 2004 09/12/19 0707  BP: 140/72 139/78  139/88  Pulse: 78 78 79 77  Resp: 16  18 20   Temp: 99 F (37.2 C) 98.9 F (37.2 C)  98.2 F (36.8 C)  TempSrc: Oral Oral  Oral  SpO2:  96% 96% 90%  Weight:      Height:       SpO2: 90 % O2 Flow Rate (L/min): 2 L/min   Intake/Output Summary (Last 24 hours) at 09/12/2019 0836 Last data filed at  09/12/2019 0714 Gross per 24 hour  Intake 930 ml  Output 1150 ml  Net -220 ml   Filed Weights   09/09/19 2234  Weight: 120.7 kg    Exam: General exam: In no acute distress. Respiratory system: Good air movement and clear to auscultation. Cardiovascular system: S1 & S2 heard, RRR. No JVD. Gastrointestinal system: Abdomen is nondistended, soft and nontender.  Central nervous system: Alert and oriented. No focal neurological deficits. Extremities: No pedal edema. Skin: No rashes, lesions or ulcers Psychiatry: Judgement and insight appear normal. Mood & affect appropriate.  Data Reviewed:    Labs: Basic Metabolic Panel: Recent Labs  Lab 09/07/19 1702 09/10/19 0536  NA 138 138  K 3.9 4.3  CL 100 101  CO2 29 29  GLUCOSE 97 164*  BUN 17 22  CREATININE 0.60* 0.74  CALCIUM 8.6* 8.8*   GFR Estimated Creatinine Clearance: 89.3 mL/min (by C-G formula based on SCr of 0.74 mg/dL). Liver  Function Tests: Recent Labs  Lab 09/07/19 1702 09/10/19 0536  AST 23 31  ALT 21 32  ALKPHOS 88 91  BILITOT 1.0 0.7  PROT 6.3* 6.1*  ALBUMIN 3.0* 3.0*   No results for input(s): LIPASE, AMYLASE in the last 168 hours. No results for input(s): AMMONIA in the last 168 hours. Coagulation profile No results for input(s): INR, PROTIME in the last 168 hours. COVID-19 Labs  No results for input(s): DDIMER, FERRITIN, LDH, CRP in the last 72 hours.  Lab Results  Component Value Date   SARSCOV2NAA NEGATIVE 08/09/2019   [place] NEGATIVE 05/07/2019    CBC: Recent Labs  Lab 09/07/19 1702 09/10/19 0536  WBC 5.4 6.3  NEUTROABS 3.4  --   HGB 12.6* 11.5*  HCT 41.1 36.6*  MCV 93.2 90.6  PLT 218 296   Cardiac Enzymes: No results for input(s): CKTOTAL, CKMB, CKMBINDEX, TROPONINI in the last 168 hours. BNP (last 3 results) No results for input(s): PROBNP in the last 8760 hours. CBG: Recent Labs  Lab 09/10/19 2215 09/11/19 0802 09/11/19 1234 09/11/19 1705 09/11/19 2002  GLUCAP  206* 120* 121* 219* 176*   D-Dimer: No results for input(s): DDIMER in the last 72 hours. Hgb A1c: No results for input(s): HGBA1C in the last 72 hours. Lipid Profile: No results for input(s): CHOL, HDL, LDLCALC, TRIG, CHOLHDL, LDLDIRECT in the last 72 hours. Thyroid function studies: No results for input(s): TSH, T4TOTAL, T3FREE, THYROIDAB in the last 72 hours.  Invalid input(s): FREET3 Anemia work up: No results for input(s): VITAMINB12, FOLATE, FERRITIN, TIBC, IRON, RETICCTPCT in the last 72 hours. Sepsis Labs: Recent Labs  Lab 09/07/19 1702 09/08/19 0343 09/10/19 0536  PROCALCITON  --  <0.10  --   WBC 5.4  --  6.3  LATICACIDVEN  --  0.6  --    Microbiology Recent Results (from the past 240 hour(s))  Blood Culture (routine x 2)     Status: None (Preliminary result)   Collection Time: 09/08/19  3:43 AM   Specimen: BLOOD  Result Value Ref Range Status   Specimen Description   Final    BLOOD RIGHT ARM Performed at [name], [place] [address], [place], [place] [address]    Special Requests   Final    BOTTLES DRAWN AEROBIC AND ANAEROBIC Blood Culture adequate volume Performed at [facility] [address], [place], [place] [address]    Culture   Final    NO GROWTH 3 DAYS Performed at [facility], [place] [address], [place], [place] [address]    Report Status PENDING  Incomplete  Blood Culture (routine x 2)     Status: None (Preliminary result)   Collection Time: 09/08/19  3:43 AM   Specimen: BLOOD  Result Value Ref Range Status   Specimen Description   Final    BLOOD LEFT ARM Performed at [facility] [address] [place], [place] [address]    Special Requests   Final    BOTTLES DRAWN AEROBIC ONLY Blood Culture adequate volume Performed at [facility] [address] [place], [place] [address]    Culture   Final    NO GROWTH 3 DAYS Performed at [facility], [place] [address] [place], [place] [address]    Report Status PENDING  Incomplete     Medications:   . sodium chloride   Intravenous Once  . ascorbic acid  1,000 mg Oral Daily  . calcium-vitamin  D  1 tablet Oral BID  . carvedilol  3.125 mg Oral BID  . cholecalciferol  2,000 Units Oral BID  . dexamethasone  6 mg Oral Daily  . enoxaparin (LOVENOX) injection  60 mg Subcutaneous Q24H  . galantamine  16 mg Oral Q breakfast  . insulin aspart  0-20 Units Subcutaneous TID WC  . insulin aspart  0-5 Units Subcutaneous QHS  . insulin glargine  5 Units Subcutaneous QHS  . iron polysaccharides  150 mg Oral Daily  . isosorbide mononitrate  30 mg Oral Daily  . lisinopril  20 mg Oral Daily  . magnesium oxide  400 mg Oral BID  . Melatonin  5 mg Oral QHS  . methenamine  1 g Oral BID  . methocarbamol  750 mg Oral TID  . polyethylene glycol  17 g Oral Daily  . pravastatin  80 mg Oral QHS  . QUEtiapine  50 mg Oral QHS  . traZODone  50 mg Oral QHS  . vitamin B-12  500 mcg Oral BID   Continuous Infusions: . remdesivir 100 mg in NS 100 mL        LOS: 4 days   Charlynne Cousins  [facility]  09/12/2019, 8:36 AM

## 2019-09-13 LAB — CBC WITH DIFFERENTIAL/PLATELET
Abs Immature Granulocytes: 0.04 10*3/uL (ref 0.00–0.07)
Basophils Absolute: 0 10*3/uL (ref 0.0–0.1)
Basophils Relative: 0 %
Eosinophils Absolute: 0 10*3/uL (ref 0.0–0.5)
Eosinophils Relative: 0 %
HCT: 40.4 % (ref 39.0–52.0)
Hemoglobin: 12.9 g/dL — ABNORMAL LOW (ref 13.0–17.0)
Immature Granulocytes: 1 %
Lymphocytes Relative: 16 %
Lymphs Abs: 1.1 10*3/uL (ref 0.7–4.0)
MCH: 28.6 pg (ref 26.0–34.0)
MCHC: 31.9 g/dL (ref 30.0–36.0)
MCV: 89.6 fL (ref 80.0–100.0)
Monocytes Absolute: 0.9 10*3/uL (ref 0.1–1.0)
Monocytes Relative: 12 %
Neutro Abs: 5.1 10*3/uL (ref 1.7–7.7)
Neutrophils Relative %: 71 %
Platelets: 411 10*3/uL — ABNORMAL HIGH (ref 150–400)
RBC: 4.51 MIL/uL (ref 4.22–5.81)
RDW: 13.6 % (ref 11.5–15.5)
WBC: 7.2 10*3/uL (ref 4.0–10.5)
nRBC: 0 % (ref 0.0–0.2)

## 2019-09-13 LAB — C-REACTIVE PROTEIN: CRP: 4.6 mg/dL — ABNORMAL HIGH (ref <1.0)

## 2019-09-13 LAB — GLUCOSE, CAPILLARY
Glucose-Capillary: 138 mg/dL — ABNORMAL HIGH (ref 70–99)
Glucose-Capillary: 100 mg/dL — ABNORMAL HIGH (ref 70–99)
Glucose-Capillary: 266 mg/dL — ABNORMAL HIGH (ref 70–99)
Glucose-Capillary: 214 mg/dL — ABNORMAL HIGH (ref 70–99)

## 2019-09-13 LAB — BASIC METABOLIC PANEL
Anion gap: 12 (ref 5–15)
BUN: 36 mg/dL — ABNORMAL HIGH (ref 8–23)
CO2: 30 mmol/L (ref 22–32)
Calcium: 9.2 mg/dL (ref 8.9–10.3)
Chloride: 97 mmol/L — ABNORMAL LOW (ref 98–111)
Creatinine, Ser: 1.06 mg/dL (ref 0.61–1.24)
GFR calc Af Amer: >60 mL/min (ref >60)
GFR calc non Af Amer: >60 mL/min (ref >60)
Glucose, Bld: 148 mg/dL — ABNORMAL HIGH (ref 70–99)
Potassium: 3.9 mmol/L (ref 3.5–5.1)
Sodium: 139 mmol/L (ref 135–145)

## 2019-09-13 LAB — D-DIMER, QUANTITATIVE: D-Dimer, Quant: 2.08 ug/mL-FEU — ABNORMAL HIGH (ref 0.00–0.50)

## 2019-09-13 NOTE — Plan of Care (Signed)
  Problem: Education: Goal: Knowledge of General Education information will improve Description: Including pain rating scale, medication(s)/side effects and non-pharmacologic comfort measures Outcome: Progressing   Problem: Health Behavior/Discharge Planning: Goal: Ability to manage health-related needs will improve Outcome: Progressing   Problem: Clinical Measurements: Goal: Ability to maintain clinical measurements within normal limits will improve Outcome: Progressing Goal: Will remain free from infection Outcome: Progressing Goal: Diagnostic test results will improve Outcome: Progressing Goal: Respiratory complications will improve Outcome: Progressing Goal: Cardiovascular complication will be avoided Outcome: Progressing   Problem: Coping: Goal: Level of anxiety will decrease Outcome: Progressing   Problem: Elimination: Goal: Will not experience complications related to bowel motility Outcome: Progressing Goal: Will not experience complications related to urinary retention Outcome: Progressing   Problem: Elimination: Goal: Will not experience complications related to urinary retention Outcome: Progressing   Problem: Safety: Goal: Ability to remain free from injury will improve Outcome: Progressing   Problem: Skin Integrity: Goal: Risk for impaired skin integrity will decrease Outcome: Progressing

## 2019-09-13 NOTE — Progress Notes (Addendum)
[facility] PROGRESS NOTE    Progress Note  [name]  MVE:[id number] DOB: 03-07-1932 DOA: 09/07/2019 PCP: [name], MD     Brief Narrative:   [name] is an 86 y.o. male past medical history significant for hypertension, PE, chronic pain with multiple back surgeries diabetes mellitus type 2 PTSD presents to the ED with cough and feeling weak was SARS-CoV-2 positive positive on 09/06/2018  Assessment/Plan:   Acute respiratory failure with hypoxia secondary to pneumonia due to COVID-19 virus: Mr. Brouwer is still requiring 2 L of oxygen to keep saturations greater than 91%. Continue IV remdesivir and steroids he did receive fresh frozen plasma on 09/11/2019 His inflammatory markers are significantly improved, we will continue to monitor closely. Physical therapy evaluate the patient rec SNF Try to keep the patient prone for at least 16 hours a day, if not prone out of bed to chair, can follow strict I's and O's Daily weights, continues into spirometry and flutter valve. Physical therapy has evaluated the patient recommended skilled nursing facility, consult social worker.  Essential hypertension: Blood pressures in excellent control continue current regimen.  His torsemide was held on admission will resume today.  PTSD: Continue Seroquel and trazodone.  Chronic pain: Continue Robaxin.  Deconditioning: Physical therapy has been consulted awaiting further recommendations.  Obstructive sleep apnea: Cannot use CPAP at this point.  Controlled diabetes mellitus type 2: Continue long-acting insulin plus sliding scale blood glucose is fairly controlled.  Stage I sacral decubitus ulcer present on admission RN Pressure Injury Documentation: Pressure Injury 07/20/16 Stage I -  Intact skin with non-blanchable redness of a localized area usually over a bony prominence. (Active)  07/20/16 1200  Location: Sacrum  Location Orientation: Medial  Staging: Stage I -   Intact skin with non-blanchable redness of a localized area usually over a bony prominence.  Wound Description (Comments):   Present on Admission:     Estimated body mass index is 37.11 kg/m as calculated from the following:   Height as of this encounter: 5\' 11"  (1.803 m).   Weight as of this encounter: 120.7 kg.  DVT prophylaxis: lovenox Family Communication:none Disposition Plan/Barrier to D/C: Patient will need to complete his course of IV remdesivir and be weaned off oxygen before discharging to skilled nursing facility Code Status:     Code Status Orders  (From admission, onward)         Start     Ordered   09/08/19 0430  Do not attempt resuscitation (DNR)  Continuous    Question Answer Comment  In the event of cardiac or respiratory ARREST Do not call a "code blue"   In the event of cardiac or respiratory ARREST Do not perform Intubation, CPR, defibrillation or ACLS   In the event of cardiac or respiratory ARREST Use medication by any route, position, wound care, and other measures to relive pain and suffering. May use oxygen, suction and manual treatment of airway obstruction as needed for comfort.      09/08/19 0429        Code Status History    Date Active Date Inactive Code Status Order ID Comments User Context   05/07/2019 2148 05/10/2019 1750 Full Code [id number]  Mercy Riding, MD Inpatient   05/11/2018 2141 05/14/2018 1558 Full Code [id number]  Jani Gravel, MD Inpatient   09/19/2017 0940 09/22/2017 2100 Full Code [id number]  Donita Brooks, NP ED   09/14/2017 2334 09/16/2017 1759 Full Code [id number]  Reesa Chew, Ankit  [place] [place] ED   01/16/2017 1314 01/17/2017 1220 Full Code [id number]  Greer Pickerel, MD Inpatient   12/17/2016 1638 12/20/2016 2035 Full Code [id number]  Debbe Odea, MD ED   08/15/2016 0312 08/18/2016 1555 Full Code [id number]  Ivor Costa, MD ED   07/20/2016 2004 07/27/2016 2039 Full Code [id number]  Edwin Dada, MD Inpatient   06/04/2016 2217 06/08/2016 1823  DNR [id number]  Toy Baker, MD Inpatient   06/04/2016 2217 06/04/2016 2217 DNR [id number]  Toy Baker, MD Inpatient   02/11/2016 2347 02/15/2016 1849 Full Code [id number]  Etta Quill, DO ED   02/19/2015 0921 02/22/2015 1552 Full Code [id number]  Corky Sing, PA-C Inpatient   11/15/2012 2116 11/21/2012 2320 Full Code [id number]  [name], MD Inpatient   11/09/2012 1655 11/15/2012 2116 Full Code [id number]  Geradine Girt, DO Inpatient   Advance Care Planning Activity    Advance Directive Documentation     Most Recent Value  Type of Advance Directive  Living will, Healthcare Power of Attorney  Pre-existing out of facility DNR order (yellow form or pink MOST form)  --  "MOST" Form in Place?  --        IV Access:    Peripheral IV   Procedures and diagnostic studies:   No results found.   Medical Consultants:    None.  Anti-Infectives:   None  Subjective:    [name] no new complaints today he relates his breathing is slightly better.  Objective:    Vitals:   09/12/19 0830 09/12/19 1544 09/12/19 2130 09/13/19 0425  BP:  132/72 121/69 115/64  Pulse:  72 78 76  Resp:  19 18   Temp:  98.4 F (36.9 C) 98.2 F (36.8 C) 98.1 F (36.7 C)  TempSrc:  Oral Oral Oral  SpO2: 90% 93% 91% 90%  Weight:      Height:       SpO2: 90 % O2 Flow Rate (L/min): 2 L/min   Intake/Output Summary (Last 24 hours) at 09/13/2019 0834 Last data filed at 09/13/2019 0600 Gross per 24 hour  Intake 960 ml  Output 851 ml  Net 109 ml   Filed Weights   09/09/19 2234  Weight: 120.7 kg    Exam: General exam: In no acute distress. Respiratory system: Good air movement and crackles at bases Cardiovascular system: S1 & S2 heard, RRR. No JVD. Gastrointestinal system: Abdomen is nondistended, soft and nontender.  Central nervous system: Alert and oriented. No focal neurological deficits. Extremities: No pedal edema. Skin: No rashes, lesions or  ulcers  Data Reviewed:    Labs: Basic Metabolic Panel: Recent Labs  Lab 09/07/19 1702 09/07/19 1702 09/10/19 0536 09/10/19 0536 09/12/19 1012 09/13/19 0055  NA 138  --  138  --  137 139  K 3.9   < > 4.3   < > 4.4 3.9  CL 100  --  101  --  101 97*  CO2 29  --  29  --  28 30  GLUCOSE 97  --  164*  --  133* 148*  BUN 17  --  22  --  24* 36*  CREATININE 0.60*  --  0.74  --  0.73 1.06  CALCIUM 8.6*  --  8.8*  --  8.6* 9.2   < > = values in this interval not displayed.   GFR Estimated Creatinine Clearance: 67.4 mL/min (by C-G formula based on SCr of 1.06 mg/dL). Liver Function Tests: Recent Labs  Lab 09/07/19 1702 09/10/19 0536  AST 23 31  ALT 21 32  ALKPHOS 88 91  BILITOT 1.0 0.7  PROT 6.3* 6.1*  ALBUMIN 3.0* 3.0*   No results for input(s): LIPASE, AMYLASE in the last 168 hours. No results for input(s): AMMONIA in the last 168 hours. Coagulation profile No results for input(s): INR, PROTIME in the last 168 hours. COVID-19 Labs  Recent Labs    09/12/19 1012 09/13/19 0055 09/13/19 0553  DDIMER 1.99*  --  2.08*  CRP 6.0* 4.6*  --     Lab Results  Component Value Date   SARSCOV2NAA NEGATIVE 08/09/2019   [place] NEGATIVE 05/07/2019    CBC: Recent Labs  Lab 09/07/19 1702 09/10/19 0536 09/12/19 1012 09/13/19 0055  WBC 5.4 6.3 6.2 7.2  NEUTROABS 3.4  --  3.9 5.1  HGB 12.6* 11.5* 12.2* 12.9*  HCT 41.1 36.6* 39.2 40.4  MCV 93.2 90.6 91.2 89.6  PLT 218 296 277 411*   Cardiac Enzymes: No results for input(s): CKTOTAL, CKMB, CKMBINDEX, TROPONINI in the last 168 hours. BNP (last 3 results) No results for input(s): PROBNP in the last 8760 hours. CBG: Recent Labs  Lab 09/12/19 0825 09/12/19 1219 09/12/19 1645 09/12/19 2134 09/13/19 0803  GLUCAP 121* 111* 260* 168* 138*   D-Dimer: Recent Labs    09/12/19 1012 09/13/19 0553  DDIMER 1.99* 2.08*   Hgb A1c: No results for input(s): HGBA1C in the last 72 hours. Lipid Profile: No results for  input(s): CHOL, HDL, LDLCALC, TRIG, CHOLHDL, LDLDIRECT in the last 72 hours. Thyroid function studies: No results for input(s): TSH, T4TOTAL, T3FREE, THYROIDAB in the last 72 hours.  Invalid input(s): FREET3 Anemia work up: No results for input(s): VITAMINB12, FOLATE, FERRITIN, TIBC, IRON, RETICCTPCT in the last 72 hours. Sepsis Labs: Recent Labs  Lab 09/07/19 1702 09/08/19 0343 09/10/19 0536 09/12/19 1012 09/13/19 0055  PROCALCITON  --  <0.10  --   --   --   WBC 5.4  --  6.3 6.2 7.2  LATICACIDVEN  --  0.6  --   --   --    Microbiology Recent Results (from the past 240 hour(s))  Blood Culture (routine x 2)     Status: None (Preliminary result)   Collection Time: 09/08/19  3:43 AM   Specimen: BLOOD  Result Value Ref Range Status   Specimen Description   Final    BLOOD RIGHT ARM Performed at [facility], [place] [address] [place], [place] [address]    Special Requests   Final    BOTTLES DRAWN AEROBIC AND ANAEROBIC Blood Culture adequate volume Performed at [facility] [address], [place], [place] [address]    Culture   Final    NO GROWTH 4 DAYS Performed at [facility], [place] [address], [place], [place] [address]    Report Status PENDING  Incomplete  Blood Culture (routine x 2)     Status: None (Preliminary result)   Collection Time: 09/08/19  3:43 AM   Specimen: BLOOD  Result Value Ref Range Status   Specimen Description   Final    BLOOD LEFT ARM Performed at [facility] [address] [place], [place] [address]    Special Requests   Final    BOTTLES DRAWN AEROBIC ONLY Blood Culture adequate volume Performed at [facility] [address], [place], [place] [address]    Culture   Final    NO GROWTH 4 DAYS  Performed at [facility], [place] [address], [place], [place] [address]    Report Status PENDING  Incomplete     Medications:   . sodium chloride   Intravenous  Once  . ascorbic acid  1,000 mg Oral Daily  . calcium-vitamin D  1 tablet Oral BID  . carvedilol  3.125 mg Oral BID  . cholecalciferol  2,000 Units Oral BID  . dexamethasone  6 mg Oral Daily  . enoxaparin (LOVENOX) injection  60 mg Subcutaneous Q24H  . galantamine  16 mg Oral Q breakfast  . insulin aspart  0-20 Units Subcutaneous TID WC  . insulin aspart  0-5 Units Subcutaneous QHS  . insulin glargine  5 Units Subcutaneous QHS  . iron polysaccharides  150 mg Oral Daily  . isosorbide mononitrate  30 mg Oral Daily  . lisinopril  20 mg Oral Daily  . magnesium oxide  400 mg Oral BID  . Melatonin  5 mg Oral QHS  . methenamine  1 g Oral BID  . methocarbamol  750 mg Oral TID  . polyethylene glycol  17 g Oral Daily  . pravastatin  80 mg Oral QHS  . QUEtiapine  50 mg Oral QHS  . torsemide  20 mg Oral BID  . traZODone  50 mg Oral QHS  . vitamin B-12  500 mcg Oral BID   Continuous Infusions: . remdesivir 100 mg in NS 100 mL 100 mg (09/12/19 1054)      LOS: 5 days   Charlynne Cousins  [facility]  09/13/2019, 8:34 AM

## 2019-09-13 NOTE — Progress Notes (Signed)
Physical Therapy Treatment Patient Details Name: [name] MRN: [id number] DOB: November 11, 1931 Today's Date: 09/13/2019    History of Present Illness 86 yo male admitted with, falls, DM, R residual weakness, back sg, PE, PTSD    PT Comments    Pt irritated and cursing at PT upon arrival to room, agreeable to bed-level exercises only and adamantly refused attempted EOB sitting. Pt appeared very frustrated for entirety of session, states he wants to speak to a doctor about his condition and states "just let me die I guess". PT will attempt to continue to see him, but unsure of pt participation moving forward.     Follow Up Recommendations  SNF     Equipment Recommendations  None recommended by PT    Recommendations for Other Services       Precautions / Restrictions Precautions Precautions: Fall Precaution Comments: monitior O2 Restrictions Weight Bearing Restrictions: No    Mobility  Bed Mobility Overal bed mobility: Needs Assistance             General bed mobility comments: attempted pull to sit with pt UE use on bedrails and total PT assist, unable to complete due to pt-reported pain. Pt adamantly refused EOB sitting.  Transfers                 General transfer comment: Pt refused  Ambulation/Gait                 Stairs             Wheelchair Mobility    Modified Rankin (Stroke Patients Only)       Balance       Sitting balance - Comments: unable to assess, pt refused                                    Cognition Arousal/Alertness: Awake/alert Behavior During Therapy: WFL for tasks assessed/performed Overall Cognitive Status: Within Functional Limits for tasks assessed                                 General Comments: Pt quite rude and impatient with PT this session, cursing at PT multiple times and throwing hands in the air in PT direction      Exercises General Exercises - Lower  Extremity Ankle Circles/Pumps: AROM;Left;15 reps;Supine Heel Slides: AROM;AAROM;Both;Supine;15 reps Hip ABduction/ADduction: AROM;AAROM;Both;10 reps;Supine    General Comments        Pertinent Vitals/Pain Pain Assessment: Faces Faces Pain Scale: Hurts little more Pain Location: generalized, with mobility Pain Descriptors / Indicators: Discomfort Pain Intervention(s): Limited activity within patient's tolerance;Monitored during session;Repositioned    Home Living                      Prior Function            PT Goals (current goals can now be found in the care plan section) Acute Rehab PT Goals Patient Stated Goal: to get better/stronger PT Goal Formulation: With patient Time For Goal Achievement: 09/25/19 Potential to Achieve Goals: Fair Progress towards PT goals: Progressing toward goals;Not progressing toward goals - comment(pt very angry today, minimally participated)    Frequency    Min 2X/week      PT Plan Current plan remains appropriate    Co-evaluation  AM-PAC PT "6 Clicks" Mobility   Outcome Measure  Help needed turning from your back to your side while in a flat bed without using bedrails?: Total Help needed moving from lying on your back to sitting on the side of a flat bed without using bedrails?: Total Help needed moving to and from a bed to a chair (including a wheelchair)?: Total Help needed standing up from a chair using your arms (e.g., wheelchair or bedside chair)?: Total Help needed to walk in hospital room?: Total Help needed climbing 3-5 steps with a railing? : Total 6 Click Score: 6    End of Session Equipment Utilized During Treatment: Oxygen Activity Tolerance: Patient limited by fatigue;Treatment limited secondary to agitation Patient left: in bed;with call bell/phone within reach;with bed alarm set Nurse Communication: Mobility status PT Visit Diagnosis: Muscle weakness (generalized) (M62.81)     Time:  1740-8144 PT Time Calculation (min) (ACUTE ONLY): 20 min  Charges:  $Therapeutic Exercise: 8-22 mins                     [name] E, PT Acute Rehabilitation Services Pager [phone]  Office [phone]    [name] 09/13/2019, 5:13 PM

## 2019-09-14 LAB — BASIC METABOLIC PANEL
Anion gap: 11 (ref 5–15)
BUN: 66 mg/dL — ABNORMAL HIGH (ref 8–23)
CO2: 30 mmol/L (ref 22–32)
Calcium: 8.9 mg/dL (ref 8.9–10.3)
Chloride: 95 mmol/L — ABNORMAL LOW (ref 98–111)
Creatinine, Ser: 1.52 mg/dL — ABNORMAL HIGH (ref 0.61–1.24)
GFR calc Af Amer: 48 mL/min — ABNORMAL LOW (ref >60)
GFR calc non Af Amer: 41 mL/min — ABNORMAL LOW (ref >60)
Glucose, Bld: 148 mg/dL — ABNORMAL HIGH (ref 70–99)
Potassium: 3.9 mmol/L (ref 3.5–5.1)
Sodium: 136 mmol/L (ref 135–145)

## 2019-09-14 LAB — CBC WITH DIFFERENTIAL/PLATELET
Abs Immature Granulocytes: 0.05 10*3/uL (ref 0.00–0.07)
Basophils Absolute: 0 10*3/uL (ref 0.0–0.1)
Basophils Relative: 0 %
Eosinophils Absolute: 0 10*3/uL (ref 0.0–0.5)
Eosinophils Relative: 0 %
HCT: 39.9 % (ref 39.0–52.0)
Hemoglobin: 12.9 g/dL — ABNORMAL LOW (ref 13.0–17.0)
Immature Granulocytes: 1 %
Lymphocytes Relative: 17 %
Lymphs Abs: 1.5 10*3/uL (ref 0.7–4.0)
MCH: 28.5 pg (ref 26.0–34.0)
MCHC: 32.3 g/dL (ref 30.0–36.0)
MCV: 88.1 fL (ref 80.0–100.0)
Monocytes Absolute: 0.9 10*3/uL (ref 0.1–1.0)
Monocytes Relative: 10 %
Neutro Abs: 6.2 10*3/uL (ref 1.7–7.7)
Neutrophils Relative %: 72 %
Platelets: 458 10*3/uL — ABNORMAL HIGH (ref 150–400)
RBC: 4.53 MIL/uL (ref 4.22–5.81)
RDW: 13.7 % (ref 11.5–15.5)
WBC: 8.7 10*3/uL (ref 4.0–10.5)
nRBC: 0 % (ref 0.0–0.2)

## 2019-09-14 LAB — D-DIMER, QUANTITATIVE: D-Dimer, Quant: 2 ug/mL-FEU — ABNORMAL HIGH (ref 0.00–0.50)

## 2019-09-14 LAB — GLUCOSE, CAPILLARY
Glucose-Capillary: 114 mg/dL — ABNORMAL HIGH (ref 70–99)
Glucose-Capillary: 124 mg/dL — ABNORMAL HIGH (ref 70–99)
Glucose-Capillary: 224 mg/dL — ABNORMAL HIGH (ref 70–99)

## 2019-09-14 LAB — C-REACTIVE PROTEIN: CRP: 2.3 mg/dL — ABNORMAL HIGH (ref <1.0)

## 2019-09-14 MED ORDER — SODIUM CHLORIDE 0.9 % IV BOLUS
500.0000 mL | Freq: Once | INTRAVENOUS | Status: AC
  Administered 2019-09-14: 500 mL via INTRAVENOUS

## 2019-09-14 NOTE — Progress Notes (Addendum)
Weaned down from 3L to 2L. Sats 92%. Pt sleeping.

## 2019-09-14 NOTE — Progress Notes (Signed)
[facility] PROGRESS NOTE    Progress Note  GORAN OLDEN  TFT:[id number] DOB: 1931-10-21 DOA: 09/07/2019 PCP: [name], MD     Brief Narrative:   [name] is an 86 y.o. male past medical history significant for hypertension, PE, chronic pain with multiple back surgeries diabetes mellitus type 2 PTSD presents to the ED with cough and feeling weak was SARS-CoV-2 positive positive on 09/06/2018  Assessment/Plan:   Acute respiratory failure with hypoxia secondary to pneumonia due to COVID-19 virus: Mr. Allerton has been weaned to room air his saturations have been greater than 94%. He did receive convalescent  Plasma, continue IV remdesivir and steroids, his inflammatory markers are significantly improved. Physical therapy evaluated the patient recommended skilled nursing facility, try to keep the patient prone 16 hours a day, if not prone out of bed to chair, strict I's and O's Daily weights continue incentive spirometry and flutter valve.  Essential hypertension: His blood pressure is low this morning, there has been a mild rise in his creatinine, will hold torsemide lisinopril and Imdur we will give him a half a liter bolus of normal saline and continue to monitor blood pressure.  Acute kidney injury: Likely prerenal azotemia in the setting of antihypertensive medications and diuretics these were held we will give a bolus of normal saline and recheck a basic metabolic panel in the morning.  PTSD: Continue Seroquel and trazodone.  Chronic pain: Continue Robaxin.  Deconditioning: Physical therapy has been consulted awaiting further recommendations.  Obstructive sleep apnea: Cannot use CPAP at this point.  Controlled diabetes mellitus type 2: Continue long-acting insulin plus sliding scale blood glucose is fairly controlled.  Stage I sacral decubitus ulcer present on admission RN Pressure Injury Documentation: Pressure Injury 07/20/16 Stage I -  Intact skin  with non-blanchable redness of a localized area usually over a bony prominence. (Active)  07/20/16 1200  Location: Sacrum  Location Orientation: Medial  Staging: Stage I -  Intact skin with non-blanchable redness of a localized area usually over a bony prominence.  Wound Description (Comments):   Present on Admission:     Estimated body mass index is 37.11 kg/m as calculated from the following:   Height as of this encounter: 5\' 11"  (1.803 m).   Weight as of this encounter: 120.7 kg.  DVT prophylaxis: lovenox Family Communication:none Disposition Plan/Barrier to D/C: Patient will need to complete his course of IV remdesivir and be weaned off oxygen before discharging to skilled nursing facility Code Status:     Code Status Orders  (From admission, onward)         Start     Ordered   09/08/19 0430  Do not attempt resuscitation (DNR)  Continuous    Question Answer Comment  In the event of cardiac or respiratory ARREST Do not call a "code blue"   In the event of cardiac or respiratory ARREST Do not perform Intubation, CPR, defibrillation or ACLS   In the event of cardiac or respiratory ARREST Use medication by any route, position, wound care, and other measures to relive pain and suffering. May use oxygen, suction and manual treatment of airway obstruction as needed for comfort.      09/08/19 0429        Code Status History    Date Active Date Inactive Code Status Order ID Comments User Context   05/07/2019 2148 05/10/2019 1750 Full Code [id number]  Mercy Riding, MD Inpatient   05/11/2018 2141 05/14/2018 1558 Full Code  [id number]  Jani Gravel, MD Inpatient   09/19/2017 0940 09/22/2017 2100 Full Code [id number]  Donita Brooks, NP ED   09/14/2017 2334 09/16/2017 1759 Full Code [id number]  Damita Lack, MD ED   01/16/2017 1314 01/17/2017 1220 Full Code [id number]  Greer Pickerel, MD Inpatient   12/17/2016 1638 12/20/2016 2035 Full Code [id number]  Debbe Odea, MD ED   08/15/2016 0312  08/18/2016 1555 Full Code [id number]  Ivor Costa, MD ED   07/20/2016 2004 07/27/2016 2039 Full Code [id number]  Edwin Dada, MD Inpatient   06/04/2016 2217 06/08/2016 1823 DNR [id number]  Toy Baker, MD Inpatient   06/04/2016 2217 06/04/2016 2217 DNR [id number]  Toy Baker, MD Inpatient   02/11/2016 2347 02/15/2016 1849 Full Code [id number]  Etta Quill, DO ED   02/19/2015 0921 02/22/2015 1552 Full Code [id number]  Corky Sing, PA-C Inpatient   11/15/2012 2116 11/21/2012 2320 Full Code [id number]  [name], MD Inpatient   11/09/2012 1655 11/15/2012 2116 Full Code [id number]  Geradine Girt, DO Inpatient   Advance Care Planning Activity    Advance Directive Documentation     Most Recent Value  Type of Advance Directive  Living will, Healthcare Power of Attorney  Pre-existing out of facility DNR order (yellow form or pink MOST form)  --  "MOST" Form in Place?  --        IV Access:    Peripheral IV   Procedures and diagnostic studies:   No results found.   Medical Consultants:    None.  Anti-Infectives:   None  Subjective:    Doreene Eland he relates he feels tired and fatigued.  Objective:    Vitals:   09/13/19 1129 09/13/19 1316 09/13/19 2149 09/14/19 0545  BP: 113/65 (!) 137/108 (!) 96/54 (!) 93/56  Pulse: 85 73 76 66  Resp:  20 18 16   Temp:  (!) 97.4 F (36.3 C) 98.4 F (36.9 C) 97.9 F (36.6 C)  TempSrc:  Oral Oral Oral  SpO2:  (!) 89% 94% 94%  Weight:      Height:       SpO2: 94 % O2 Flow Rate (L/min): 6 L/min   Intake/Output Summary (Last 24 hours) at 09/14/2019 0836 Last data filed at 09/14/2019 0545 Gross per 24 hour  Intake 480 ml  Output 1525 ml  Net -1045 ml   Filed Weights   09/09/19 2234  Weight: 120.7 kg    Exam: General exam: In no acute distress. Respiratory system: Good air movement and clear to auscultation. Cardiovascular system: S1 & S2 heard, RRR. No JVD. Gastrointestinal system:  Abdomen is nondistended, soft and nontender.  Central nervous system: Alert and oriented. No focal neurological deficits. Extremities: No pedal edema. Skin: No rashes, lesions or ulcers  Data Reviewed:    Labs: Basic Metabolic Panel: Recent Labs  Lab 09/07/19 1702 09/07/19 1702 09/10/19 0536 09/10/19 0536 09/12/19 1012 09/12/19 1012 09/13/19 0055 09/14/19 0239  NA 138  --  138  --  137  --  139 136  K 3.9   < > 4.3   < > 4.4   < > 3.9 3.9  CL 100  --  101  --  101  --  97* 95*  CO2 29  --  29  --  28  --  30 30  GLUCOSE 97  --  164*  --  133*  --  148* 148*  BUN 17  --  22  --  24*  --  36* 66*  CREATININE 0.60*  --  0.74  --  0.73  --  1.06 1.52*  CALCIUM 8.6*  --  8.8*  --  8.6*  --  9.2 8.9   < > = values in this interval not displayed.   GFR Estimated Creatinine Clearance: 47 mL/min (A) (by C-G formula based on SCr of 1.52 mg/dL (H)). Liver Function Tests: Recent Labs  Lab 09/07/19 1702 09/10/19 0536  AST 23 31  ALT 21 32  ALKPHOS 88 91  BILITOT 1.0 0.7  PROT 6.3* 6.1*  ALBUMIN 3.0* 3.0*   No results for input(s): LIPASE, AMYLASE in the last 168 hours. No results for input(s): AMMONIA in the last 168 hours. Coagulation profile No results for input(s): INR, PROTIME in the last 168 hours. COVID-19 Labs  Recent Labs    09/12/19 1012 09/13/19 0055 09/13/19 0553 09/14/19 0239  DDIMER 1.99*  --  2.08* 2.00*  CRP 6.0* 4.6*  --  2.3*    Lab Results  Component Value Date   SARSCOV2NAA NEGATIVE 08/09/2019   [place] NEGATIVE 05/07/2019    CBC: Recent Labs  Lab 09/07/19 1702 09/10/19 0536 09/12/19 1012 09/13/19 0055 09/14/19 0239  WBC 5.4 6.3 6.2 7.2 8.7  NEUTROABS 3.4  --  3.9 5.1 6.2  HGB 12.6* 11.5* 12.2* 12.9* 12.9*  HCT 41.1 36.6* 39.2 40.4 39.9  MCV 93.2 90.6 91.2 89.6 88.1  PLT 218 296 277 411* 458*   Cardiac Enzymes: No results for input(s): CKTOTAL, CKMB, CKMBINDEX, TROPONINI in the last 168 hours. BNP (last 3 results) No  results for input(s): PROBNP in the last 8760 hours. CBG: Recent Labs  Lab 09/12/19 2134 09/13/19 0803 09/13/19 1202 09/13/19 1652 09/13/19 2150  GLUCAP 168* 138* 100* 266* 214*   D-Dimer: Recent Labs    09/13/19 0553 09/14/19 0239  DDIMER 2.08* 2.00*   Hgb A1c: No results for input(s): HGBA1C in the last 72 hours. Lipid Profile: No results for input(s): CHOL, HDL, LDLCALC, TRIG, CHOLHDL, LDLDIRECT in the last 72 hours. Thyroid function studies: No results for input(s): TSH, T4TOTAL, T3FREE, THYROIDAB in the last 72 hours.  Invalid input(s): FREET3 Anemia work up: No results for input(s): VITAMINB12, FOLATE, FERRITIN, TIBC, IRON, RETICCTPCT in the last 72 hours. Sepsis Labs: Recent Labs  Lab 09/07/19 1702 09/08/19 0343 09/10/19 0536 09/12/19 1012 09/13/19 0055 09/14/19 0239  PROCALCITON  --  <0.10  --   --   --   --   WBC   < >  --  6.3 6.2 7.2 8.7  LATICACIDVEN  --  0.6  --   --   --   --    < > = values in this interval not displayed.   Microbiology Recent Results (from the past 240 hour(s))  Blood Culture (routine x 2)     Status: None (Preliminary result)   Collection Time: 09/08/19  3:43 AM   Specimen: BLOOD  Result Value Ref Range Status   Specimen Description   Final    BLOOD RIGHT ARM Performed at [facility] [address], [place], [place] [address]    Special Requests   Final    BOTTLES DRAWN AEROBIC AND ANAEROBIC Blood Culture adequate volume Performed at [facility] [address], [place], [place] [address]    Culture   Final    NO GROWTH 4 DAYS Performed at [facility], [place] [address] [place], [place] [address]  Report Status PENDING  Incomplete  Blood Culture (routine x 2)     Status: None (Preliminary result)   Collection Time: 09/08/19  3:43 AM   Specimen: BLOOD  Result Value Ref Range Status   Specimen Description   Final    BLOOD LEFT ARM Performed at [facility] [address], [place], [place] [address]    Special Requests   Final    BOTTLES DRAWN AEROBIC ONLY Blood Culture adequate volume Performed at [facility] [address] [place], [place] [address]    Culture   Final    NO GROWTH 4 DAYS Performed at [facility], [place] [address] [place], [place] [address]    Report Status PENDING  Incomplete     Medications:   . sodium chloride   Intravenous Once  . ascorbic acid  1,000 mg Oral Daily  . calcium-vitamin D  1 tablet Oral BID  . carvedilol  3.125 mg Oral BID  . cholecalciferol  2,000 Units Oral BID  . dexamethasone  6 mg Oral Daily  . enoxaparin (LOVENOX) injection  60 mg Subcutaneous Q24H  . galantamine  16 mg Oral Q breakfast  . insulin aspart  0-20 Units Subcutaneous TID WC  . insulin aspart  0-5 Units Subcutaneous QHS  . insulin glargine  5 Units Subcutaneous QHS  . iron polysaccharides  150 mg Oral Daily  . isosorbide mononitrate  30 mg Oral Daily  . lisinopril  20 mg Oral Daily  . magnesium oxide  400 mg Oral BID  . Melatonin  5 mg Oral QHS  . methenamine  1 g Oral BID  . methocarbamol  750 mg Oral TID  . polyethylene glycol  17 g Oral Daily  . pravastatin  80 mg Oral QHS  . QUEtiapine  50 mg Oral QHS  . torsemide  20 mg Oral BID  . traZODone  50 mg Oral QHS  . vitamin B-12  500 mcg Oral BID   Continuous Infusions: . remdesivir 100 mg in NS 100 mL 100 mg (09/13/19 1113)      LOS: 6 days   Charlynne Cousins  [facility]  09/14/2019, 8:36 AM

## 2019-09-15 LAB — BASIC METABOLIC PANEL
Anion gap: 9 (ref 5–15)
BUN: 70 mg/dL — ABNORMAL HIGH (ref 8–23)
CO2: 31 mmol/L (ref 22–32)
Calcium: 8.8 mg/dL — ABNORMAL LOW (ref 8.9–10.3)
Chloride: 100 mmol/L (ref 98–111)
Creatinine, Ser: 1.22 mg/dL (ref 0.61–1.24)
GFR calc Af Amer: >60 mL/min (ref >60)
GFR calc non Af Amer: 54 mL/min — ABNORMAL LOW (ref >60)
Glucose, Bld: 152 mg/dL — ABNORMAL HIGH (ref 70–99)
Potassium: 3.8 mmol/L (ref 3.5–5.1)
Sodium: 140 mmol/L (ref 135–145)

## 2019-09-15 LAB — CBC WITH DIFFERENTIAL/PLATELET
Abs Immature Granulocytes: 0.03 10*3/uL (ref 0.00–0.07)
Basophils Absolute: 0 10*3/uL (ref 0.0–0.1)
Basophils Relative: 0 %
Eosinophils Absolute: 0 10*3/uL (ref 0.0–0.5)
Eosinophils Relative: 0 %
HCT: 41.2 % (ref 39.0–52.0)
Hemoglobin: 13.1 g/dL (ref 13.0–17.0)
Immature Granulocytes: 0 %
Lymphocytes Relative: 17 %
Lymphs Abs: 1.3 10*3/uL (ref 0.7–4.0)
MCH: 28.5 pg (ref 26.0–34.0)
MCHC: 31.8 g/dL (ref 30.0–36.0)
MCV: 89.8 fL (ref 80.0–100.0)
Monocytes Absolute: 0.9 10*3/uL (ref 0.1–1.0)
Monocytes Relative: 12 %
Neutro Abs: 5.6 10*3/uL (ref 1.7–7.7)
Neutrophils Relative %: 71 %
Platelets: 431 10*3/uL — ABNORMAL HIGH (ref 150–400)
RBC: 4.59 MIL/uL (ref 4.22–5.81)
RDW: 13.8 % (ref 11.5–15.5)
WBC: 7.8 10*3/uL (ref 4.0–10.5)
nRBC: 0 % (ref 0.0–0.2)

## 2019-09-15 LAB — GLUCOSE, CAPILLARY
Glucose-Capillary: 108 mg/dL — ABNORMAL HIGH (ref 70–99)
Glucose-Capillary: 125 mg/dL — ABNORMAL HIGH (ref 70–99)
Glucose-Capillary: 185 mg/dL — ABNORMAL HIGH (ref 70–99)
Glucose-Capillary: 270 mg/dL — ABNORMAL HIGH (ref 70–99)

## 2019-09-15 LAB — D-DIMER, QUANTITATIVE: D-Dimer, Quant: 2.06 ug/mL-FEU — ABNORMAL HIGH (ref 0.00–0.50)

## 2019-09-15 LAB — CULTURE, BLOOD (ROUTINE X 2)
Culture: NO GROWTH
Culture: NO GROWTH
Special Requests: ADEQUATE
Special Requests: ADEQUATE

## 2019-09-15 LAB — C-REACTIVE PROTEIN: CRP: 1.5 mg/dL — ABNORMAL HIGH (ref <1.0)

## 2019-09-15 NOTE — Progress Notes (Signed)
[facility] PROGRESS NOTE    Progress Note  COBI DELPH  YTK:[id number] DOB: 1932/02/26 DOA: 09/07/2019 PCP: [name], MD     Brief Narrative:   [name] is an 86 y.o. male past medical history significant for hypertension, PE, chronic pain with multiple back surgeries diabetes mellitus type 2 PTSD presents to the ED with cough and feeling weak was SARS-CoV-2 positive positive on 09/06/2018  Assessment/Plan:   Acute respiratory failure with hypoxia secondary to pneumonia due to COVID-19 virus: Mr. Bowns is requiring 2 L of oxygen to keep saturations greater than 90%. He did receive convalescent plasma 06/18/2019. He will complete his course of IV remdesivir today, will continue IV steroids. His inflammatory markers are significantly improved. Keep the patient has not been able to prone, and has not been able to be out of bed to chair. Physical therapy evaluated the patient and recommended skilled nursing facility. Social Scientist, research (life sciences) has been consulted for placement.  Essential hypertension/hypotension: He became mildly hypotensive on 09/14/2019, he was given a liter bolus and his to hypertensive medications were held.  His blood pressure is risen nicely. Continue to hold antihypertensive medication.  Acute kidney injury: Likely prerenal azotemia in the setting of decreased oral intake and antihypertensive medication use. His antihypertensive medications were held he was given a bolus of normal saline and his creatinine returned to baseline.  PTSD: Continue Seroquel and trazodone.  Chronic pain: Continue Robaxin.  Deconditioning: Physical therapy has been consulted awaiting further recommendations.  Obstructive sleep apnea: Cannot use CPAP at this point.  Controlled diabetes mellitus type 2: Continue long-acting insulin plus sliding scale blood glucose is fairly controlled.  Stage I sacral decubitus ulcer present on admission RN Pressure Injury  Documentation: Pressure Injury 07/20/16 Stage I -  Intact skin with non-blanchable redness of a localized area usually over a bony prominence. (Active)  07/20/16 1200  Location: Sacrum  Location Orientation: Medial  Staging: Stage I -  Intact skin with non-blanchable redness of a localized area usually over a bony prominence.  Wound Description (Comments):   Present on Admission:     Estimated body mass index is 37.11 kg/m as calculated from the following:   Height as of this encounter: 5\' 11"  (1.803 m).   Weight as of this encounter: 120.7 kg.  DVT prophylaxis: lovenox Family Communication:none Disposition Plan/Barrier to D/C: Patient will need to complete his course of IV remdesivir and be weaned off oxygen before discharging to skilled nursing facility Code Status:     Code Status Orders  (From admission, onward)         Start     Ordered   09/08/19 0430  Do not attempt resuscitation (DNR)  Continuous    Question Answer Comment  In the event of cardiac or respiratory ARREST Do not call a "code blue"   In the event of cardiac or respiratory ARREST Do not perform Intubation, CPR, defibrillation or ACLS   In the event of cardiac or respiratory ARREST Use medication by any route, position, wound care, and other measures to relive pain and suffering. May use oxygen, suction and manual treatment of airway obstruction as needed for comfort.      09/08/19 0429        Code Status History    Date Active Date Inactive Code Status Order ID Comments User Context   05/07/2019 2148 05/10/2019 1750 Full Code [id number]  Mercy Riding, MD Inpatient   05/11/2018 2141 05/14/2018 1558 Full Code  [id number]  Jani Gravel, MD Inpatient   09/19/2017 0940 09/22/2017 2100 Full Code [id number]  Donita Brooks, NP ED   09/14/2017 2334 09/16/2017 1759 Full Code [id number]  Damita Lack, MD ED   01/16/2017 1314 01/17/2017 1220 Full Code [id number]  Greer Pickerel, MD Inpatient   12/17/2016 1638 12/20/2016 2035  Full Code [id number]  Debbe Odea, MD ED   08/15/2016 0312 08/18/2016 1555 Full Code [id number]  Ivor Costa, MD ED   07/20/2016 2004 07/27/2016 2039 Full Code [id number]  Edwin Dada, MD Inpatient   06/04/2016 2217 06/08/2016 1823 DNR [id number]  Toy Baker, MD Inpatient   06/04/2016 2217 06/04/2016 2217 DNR [id number]  Toy Baker, MD Inpatient   02/11/2016 2347 02/15/2016 1849 Full Code [id number]  Etta Quill, DO ED   02/19/2015 0921 02/22/2015 1552 Full Code [id number]  Corky Sing, PA-C Inpatient   11/15/2012 2116 11/21/2012 2320 Full Code [id number]  [name], MD Inpatient   11/09/2012 1655 11/15/2012 2116 Full Code [id number]  Geradine Girt, DO Inpatient   Advance Care Planning Activity    Advance Directive Documentation     Most Recent Value  Type of Advance Directive  Living will, Healthcare Power of Attorney  Pre-existing out of facility DNR order (yellow form or pink MOST form)  -  "MOST" Form in Place?  -        IV Access:    Peripheral IV   Procedures and diagnostic studies:   No results found.   Medical Consultants:    None.  Anti-Infectives:   Remdesivir  Subjective:    STARR ENGEL relates he feels better this morning with more energy.  Objective:    Vitals:   09/14/19 1110 09/14/19 1502 09/14/19 2005 09/15/19 0502  BP: 105/69 (!) 99/51 113/82 (!) 110/57  Pulse: 75 63 68 74  Resp:  20 18 18   Temp:  98.3 F (36.8 C) 98.1 F (36.7 C) 97.7 F (36.5 C)  TempSrc:  Oral Oral Oral  SpO2: 93% 93% 93% (!) 89%  Weight:      Height:       SpO2: (!) 89 % O2 Flow Rate (L/min): 2 L/min   Intake/Output Summary (Last 24 hours) at 09/15/2019 0902 Last data filed at 09/15/2019 0800 Gross per 24 hour  Intake 540 ml  Output 550 ml  Net -10 ml   Filed Weights   09/09/19 2234  Weight: 120.7 kg    Exam: General exam: In no acute distress. Respiratory system: Good air movement and diffuse crackles bilaterally  Cardiovascular system: S1 & S2 heard, RRR. No JVD. Gastrointestinal system: Abdomen is nondistended, soft and nontender.  Central nervous system: Alert and oriented.  Extremities: No pedal edema. Skin: No rashes, lesions or ulcers  Data Reviewed:    Labs: Basic Metabolic Panel: Recent Labs  Lab 09/10/19 0536 09/10/19 0536 09/12/19 1012 09/12/19 1012 09/13/19 0055 09/13/19 0055 09/14/19 0239 09/15/19 0254  NA 138  --  137  --  139  --  136 140  K 4.3   < > 4.4   < > 3.9   < > 3.9 3.8  CL 101  --  101  --  97*  --  95* 100  CO2 29  --  28  --  30  --  30 31  GLUCOSE 164*  --  133*  --  148*  --  148* 152*  BUN 22  --  24*  --  36*  --  66* 70*  CREATININE 0.74  --  0.73  --  1.06  --  1.52* 1.22  CALCIUM 8.8*  --  8.6*  --  9.2  --  8.9 8.8*   < > = values in this interval not displayed.   GFR Estimated Creatinine Clearance: 58.5 mL/min (by C-G formula based on SCr of 1.22 mg/dL). Liver Function Tests: Recent Labs  Lab 09/10/19 0536  AST 31  ALT 32  ALKPHOS 91  BILITOT 0.7  PROT 6.1*  ALBUMIN 3.0*   No results for input(s): LIPASE, AMYLASE in the last 168 hours. No results for input(s): AMMONIA in the last 168 hours. Coagulation profile No results for input(s): INR, PROTIME in the last 168 hours. COVID-19 Labs  Recent Labs    09/12/19 1012 09/13/19 0055 09/13/19 0553 09/14/19 0239 09/15/19 0254  DDIMER   < >  --  2.08* 2.00* 2.06*  CRP  --  4.6*  --  2.3* 1.5*   < > = values in this interval not displayed.    Lab Results  Component Value Date   SARSCOV2NAA NEGATIVE 08/09/2019   [place] NEGATIVE 05/07/2019    CBC: Recent Labs  Lab 09/10/19 0536 09/12/19 1012 09/13/19 0055 09/14/19 0239 09/15/19 0254  WBC 6.3 6.2 7.2 8.7 7.8  NEUTROABS  --  3.9 5.1 6.2 5.6  HGB 11.5* 12.2* 12.9* 12.9* 13.1  HCT 36.6* 39.2 40.4 39.9 41.2  MCV 90.6 91.2 89.6 88.1 89.8  PLT 296 277 411* 458* 431*   Cardiac Enzymes: No results for input(s): CKTOTAL,  CKMB, CKMBINDEX, TROPONINI in the last 168 hours. BNP (last 3 results) No results for input(s): PROBNP in the last 8760 hours. CBG: Recent Labs  Lab 09/13/19 2150 09/14/19 0837 09/14/19 1135 09/14/19 2006 09/15/19 0727  GLUCAP 214* 114* 124* 224* 108*   D-Dimer: Recent Labs    09/14/19 0239 09/15/19 0254  DDIMER 2.00* 2.06*   Hgb A1c: No results for input(s): HGBA1C in the last 72 hours. Lipid Profile: No results for input(s): CHOL, HDL, LDLCALC, TRIG, CHOLHDL, LDLDIRECT in the last 72 hours. Thyroid function studies: No results for input(s): TSH, T4TOTAL, T3FREE, THYROIDAB in the last 72 hours.  Invalid input(s): FREET3 Anemia work up: No results for input(s): VITAMINB12, FOLATE, FERRITIN, TIBC, IRON, RETICCTPCT in the last 72 hours. Sepsis Labs: Recent Labs  Lab 09/12/19 1012 09/13/19 0055 09/14/19 0239 09/15/19 0254  WBC 6.2 7.2 8.7 7.8   Microbiology Recent Results (from the past 240 hour(s))  Blood Culture (routine x 2)     Status: None (Preliminary result)   Collection Time: 09/08/19  3:43 AM   Specimen: BLOOD  Result Value Ref Range Status   Specimen Description   Final    BLOOD RIGHT ARM Performed at [facility], [place] [address], [place], [place] [address]    Special Requests   Final    BOTTLES DRAWN AEROBIC AND ANAEROBIC Blood Culture adequate volume Performed at [place] [address] [place], [place] [address]    Culture   Final    NO GROWTH 4 DAYS Performed at [facility], [place] [address], [place], [place] [address]    Report Status PENDING  Incomplete  Blood Culture (routine x 2)     Status: None (Preliminary result)   Collection Time: 09/08/19  3:43 AM   Specimen: BLOOD  Result Value Ref Range Status   Specimen Description   Final    BLOOD LEFT ARM Performed at  [facility], [place] [address], [place], [place] [address]    Special Requests   Final    BOTTLES DRAWN  AEROBIC ONLY Blood Culture adequate volume Performed at [place] [name], [place], [place] [address]    Culture   Final    NO GROWTH 4 DAYS Performed at [facility], [place] [address] [place], [place] [address]    Report Status PENDING  Incomplete     Medications:   . sodium chloride   Intravenous Once  . ascorbic acid  1,000 mg Oral Daily  . calcium-vitamin D  1 tablet Oral BID  . carvedilol  3.125 mg Oral BID  . cholecalciferol  2,000 Units Oral BID  . dexamethasone  6 mg Oral Daily  . enoxaparin (LOVENOX) injection  60 mg Subcutaneous Q24H  . galantamine  16 mg Oral Q breakfast  . insulin aspart  0-20 Units Subcutaneous TID WC  . insulin aspart  0-5 Units Subcutaneous QHS  . insulin glargine  5 Units Subcutaneous QHS  . iron polysaccharides  150 mg Oral Daily  . magnesium oxide  400 mg Oral BID  . Melatonin  5 mg Oral QHS  . methenamine  1 g Oral BID  . methocarbamol  750 mg Oral TID  . polyethylene glycol  17 g Oral Daily  . pravastatin  80 mg Oral QHS  . QUEtiapine  50 mg Oral QHS  . traZODone  50 mg Oral QHS  . vitamin B-12  500 mcg Oral BID   Continuous Infusions: . remdesivir 100 mg in NS 100 mL 100 mg (09/14/19 1101)      LOS: 7 days   Charlynne Cousins  [facility]  09/15/2019, 9:02 AM

## 2019-09-15 NOTE — Plan of Care (Signed)

## 2019-09-15 NOTE — Progress Notes (Signed)
Patient was on 3 liters nasal cannula at start of shift, was able to wean down to 1 liter currently. Maintaining O2 sats between 91-93%. Note that patient does frequently remove the nasal cannula inadvertently and puts under chin or on forehead. During these times the O2 sats are between 86-91%. Will continue to monitor.

## 2019-09-15 NOTE — Progress Notes (Signed)
Patient on 1 liter [place] at start of shift, was maintaining sats of 90-93%, around 2100 O2 sats fluctuated between  83-89%, turned up to 2 liters, patient desats when talking a lot, pulse ox probe replaced and reading fluctuated between 86-90% on 2 liters with a good pleth, turned up to 3 liters and patient finally able to maintain a sat of 91%, will continue to monitor to see if he can be weaned down later tonight, also note that the nasal cannula frequently is found out of his nose.

## 2019-09-16 ENCOUNTER — Inpatient Hospital Stay (HOSPITAL_COMMUNITY): Payer: Medicare Other

## 2019-09-16 LAB — BRAIN NATRIURETIC PEPTIDE: B Natriuretic Peptide: 56.9 pg/mL (ref 0.0–100.0)

## 2019-09-16 LAB — CBC WITH DIFFERENTIAL/PLATELET
Abs Immature Granulocytes: 0.05 10*3/uL (ref 0.00–0.07)
Basophils Absolute: 0 10*3/uL (ref 0.0–0.1)
Basophils Relative: 0 %
Eosinophils Absolute: 0 10*3/uL (ref 0.0–0.5)
Eosinophils Relative: 0 %
HCT: 42.8 % (ref 39.0–52.0)
Hemoglobin: 13.2 g/dL (ref 13.0–17.0)
Immature Granulocytes: 1 %
Lymphocytes Relative: 16 %
Lymphs Abs: 1.8 10*3/uL (ref 0.7–4.0)
MCH: 28.1 pg (ref 26.0–34.0)
MCHC: 30.8 g/dL (ref 30.0–36.0)
MCV: 91.1 fL (ref 80.0–100.0)
Monocytes Absolute: 1.2 10*3/uL — ABNORMAL HIGH (ref 0.1–1.0)
Monocytes Relative: 11 %
Neutro Abs: 7.8 10*3/uL — ABNORMAL HIGH (ref 1.7–7.7)
Neutrophils Relative %: 72 %
Platelets: 430 10*3/uL — ABNORMAL HIGH (ref 150–400)
RBC: 4.7 MIL/uL (ref 4.22–5.81)
RDW: 13.5 % (ref 11.5–15.5)
WBC: 10.9 10*3/uL — ABNORMAL HIGH (ref 4.0–10.5)
nRBC: 0 % (ref 0.0–0.2)

## 2019-09-16 LAB — BASIC METABOLIC PANEL
Anion gap: 8 (ref 5–15)
BUN: 65 mg/dL — ABNORMAL HIGH (ref 8–23)
CO2: 32 mmol/L (ref 22–32)
Calcium: 9.2 mg/dL (ref 8.9–10.3)
Chloride: 103 mmol/L (ref 98–111)
Creatinine, Ser: 0.97 mg/dL (ref 0.61–1.24)
GFR calc Af Amer: >60 mL/min (ref >60)
GFR calc non Af Amer: >60 mL/min (ref >60)
Glucose, Bld: 211 mg/dL — ABNORMAL HIGH (ref 70–99)
Potassium: 4 mmol/L (ref 3.5–5.1)
Sodium: 143 mmol/L (ref 135–145)

## 2019-09-16 LAB — GLUCOSE, CAPILLARY
Glucose-Capillary: 191 mg/dL — ABNORMAL HIGH (ref 70–99)
Glucose-Capillary: 158 mg/dL — ABNORMAL HIGH (ref 70–99)
Glucose-Capillary: 168 mg/dL — ABNORMAL HIGH (ref 70–99)
Glucose-Capillary: 313 mg/dL — ABNORMAL HIGH (ref 70–99)
Glucose-Capillary: 170 mg/dL — ABNORMAL HIGH (ref 70–99)

## 2019-09-16 LAB — D-DIMER, QUANTITATIVE: D-Dimer, Quant: 2.07 ug/mL-FEU — ABNORMAL HIGH (ref 0.00–0.50)

## 2019-09-16 LAB — C-REACTIVE PROTEIN: CRP: 0.8 mg/dL (ref <1.0)

## 2019-09-16 MED ORDER — DEXAMETHASONE 4 MG PO TABS
6.0000 mg | ORAL_TABLET | Freq: Once | ORAL | Status: AC
  Administered 2019-09-16: 6 mg via ORAL
  Filled 2019-09-16: qty 2

## 2019-09-16 MED ORDER — LISINOPRIL 10 MG PO TABS
10.0000 mg | ORAL_TABLET | Freq: Every day | ORAL | Status: DC
  Administered 2019-09-17 – 2019-09-18 (×2): 10 mg via ORAL
  Filled 2019-09-16 (×3): qty 1

## 2019-09-16 MED ORDER — SODIUM CHLORIDE 0.9 % IV BOLUS
1000.0000 mL | Freq: Once | INTRAVENOUS | Status: AC
  Administered 2019-09-16: 1000 mL via INTRAVENOUS

## 2019-09-16 NOTE — Progress Notes (Signed)
[facility] PROGRESS NOTE    Progress Note  NABOR THOMANN  JQG:[id number] DOB: 03-Jun-1932 DOA: 09/07/2019 PCP: [name], MD     Brief Narrative:   EULALIO REAMY is an 86 y.o. male past medical history significant for hypertension, PE, chronic pain with multiple back surgeries diabetes mellitus type 2 PTSD presents to the ED with cough and feeling weak was SARS-CoV-2 positive positive on 09/06/2018  Assessment/Plan:   Acute respiratory failure with hypoxia secondary to pneumonia due to COVID-19 virus: Mr. Keesling is requiring 2 L to keep saturations greater than 93%. He did receive convalescent plasma on 09/18/2019, he has completed course of IV remdesivir, continue steroids until he is off oxygen please wean off oxygen The patient has not been able to prone try to keep the patient out of the bed, try to place him in the chair. With a new elevation in his white blood cell count with a left shift we will repeat a chest x-ray.  Check a BNP  Essential hypertension/hypotension: He became slightly hypotensive today with resumption of his Coreg and lisinopril, will hold these this morning, will give him a liter of normal saline. He will probably need to be discharged off antihypertensive medication to skilled nursing facility. We will continue to monitor blood pressure for the next 24 hours.  Acute kidney injury: Likely prerenal azotemia in the setting of decreased oral intake and antibiotics medication use. He was given a bolus of normal saline his antihypertensive medications were held his creatinine returned to baseline. His kidney function has remained at baseline this morning.  PTSD: Continue Seroquel and trazodone.  Chronic pain: Continue Robaxin.  Deconditioning: Physical therapy has been consulted awaiting further recommendations.  Obstructive sleep apnea: Cannot use CPAP at this point.  Controlled diabetes mellitus type 2: Continue long-acting insulin plus  sliding scale blood glucose is fairly controlled.  Stage I sacral decubitus ulcer present on admission RN Pressure Injury Documentation: Pressure Injury 07/20/16 Stage I -  Intact skin with non-blanchable redness of a localized area usually over a bony prominence. (Active)  07/20/16 1200  Location: Sacrum  Location Orientation: Medial  Staging: Stage I -  Intact skin with non-blanchable redness of a localized area usually over a bony prominence.  Wound Description (Comments):   Present on Admission:     Estimated body mass index is 37.11 kg/m as calculated from the following:   Height as of this encounter: 5\' 11"  (1.803 m).   Weight as of this encounter: 120.7 kg.  DVT prophylaxis: lovenox Family Communication:none Disposition Plan/Barrier to D/C: Patient will need to complete his course of IV remdesivir and be weaned off oxygen before discharging to skilled nursing facility Code Status:     Code Status Orders  (From admission, onward)         Start     Ordered   09/08/19 0430  Do not attempt resuscitation (DNR)  Continuous    Question Answer Comment  In the event of cardiac or respiratory ARREST Do not call a "code blue"   In the event of cardiac or respiratory ARREST Do not perform Intubation, CPR, defibrillation or ACLS   In the event of cardiac or respiratory ARREST Use medication by any route, position, wound care, and other measures to relive pain and suffering. May use oxygen, suction and manual treatment of airway obstruction as needed for comfort.      09/08/19 0429        Code Status History  Date Active Date Inactive Code Status Order ID Comments User Context   05/07/2019 2148 05/10/2019 1750 Full Code [id number]  Mercy Riding, MD Inpatient   05/11/2018 2141 05/14/2018 1558 Full Code [id number]  Jani Gravel, MD Inpatient   09/19/2017 0940 09/22/2017 2100 Full Code [id number]  Donita Brooks, NP ED   09/14/2017 2334 09/16/2017 1759 Full Code [id number]  Damita Lack, MD ED   01/16/2017 1314 01/17/2017 1220 Full Code [id number]  Greer Pickerel, MD Inpatient   12/17/2016 1638 12/20/2016 2035 Full Code [id number]  Debbe Odea, MD ED   08/15/2016 0312 08/18/2016 1555 Full Code [id number]  Ivor Costa, MD ED   07/20/2016 2004 07/27/2016 2039 Full Code [id number]  Edwin Dada, MD Inpatient   06/04/2016 2217 06/08/2016 1823 DNR [id number]  Toy Baker, MD Inpatient   06/04/2016 2217 06/04/2016 2217 DNR [id number]  Toy Baker, MD Inpatient   02/11/2016 2347 02/15/2016 1849 Full Code [id number]  Etta Quill, DO ED   02/19/2015 0921 02/22/2015 1552 Full Code [id number]  Corky Sing, PA-C Inpatient   11/15/2012 2116 11/21/2012 2320 Full Code [id number]  [name], MD Inpatient   11/09/2012 1655 11/15/2012 2116 Full Code [id number]  Geradine Girt, DO Inpatient   Advance Care Planning Activity    Advance Directive Documentation     Most Recent Value  Type of Advance Directive  Living will, Healthcare Power of Attorney  Pre-existing out of facility DNR order (yellow form or pink MOST form)  --  "MOST" Form in Place?  --        IV Access:    Peripheral IV   Procedures and diagnostic studies:   No results found.   Medical Consultants:    None.  Anti-Infectives:   Remdesivir  Subjective:    Doreene Eland relates he feels significantly tired, weak this morning and fatigue  Objective:    Vitals:   09/15/19 1438 09/15/19 1610 09/15/19 2019 09/16/19 0645  BP: (!) 120/55  109/61 136/75  Pulse: 68  79 86  Resp: 18  18 20   Temp: 98 F (36.7 C)  97.7 F (36.5 C) 98.2 F (36.8 C)  TempSrc: Oral  Oral Oral  SpO2: 92% 90% 93% 95%  Weight:      Height:       SpO2: 95 % O2 Flow Rate (L/min): 2 L/min   Intake/Output Summary (Last 24 hours) at 09/16/2019 0825 Last data filed at 09/16/2019 0645 Gross per 24 hour  Intake 1180 ml  Output 1550 ml  Net -370 ml   Filed Weights   09/09/19 2234  Weight: 120.7  kg    Exam: General exam: In no acute distress. Respiratory system: Good air movement and diffuse crackles. Cardiovascular system: S1 & S2 heard, RRR. No JVD. Gastrointestinal system: Abdomen is nondistended, soft and nontender.  Central nervous system: Alert and oriented. No focal neurological deficits. Extremities: No pedal edema. Skin: No rashes, lesions or ulcers  Data Reviewed:    Labs: Basic Metabolic Panel: Recent Labs  Lab 09/12/19 1012 09/12/19 1012 09/13/19 0055 09/13/19 0055 09/14/19 0239 09/14/19 0239 09/15/19 0254 09/16/19 0310  NA 137  --  139  --  136  --  140 143  K 4.4   < > 3.9   < > 3.9   < > 3.8 4.0  CL 101  --  97*  --  95*  --  100 103  CO2 28  --  30  --  30  --  31 32  GLUCOSE 133*  --  148*  --  148*  --  152* 211*  BUN 24*  --  36*  --  66*  --  70* 65*  CREATININE 0.73  --  1.06  --  1.52*  --  1.22 0.97  CALCIUM 8.6*  --  9.2  --  8.9  --  8.8* 9.2   < > = values in this interval not displayed.   GFR Estimated Creatinine Clearance: 73.6 mL/min (by C-G formula based on SCr of 0.97 mg/dL). Liver Function Tests: Recent Labs  Lab 09/10/19 0536  AST 31  ALT 32  ALKPHOS 91  BILITOT 0.7  PROT 6.1*  ALBUMIN 3.0*   No results for input(s): LIPASE, AMYLASE in the last 168 hours. No results for input(s): AMMONIA in the last 168 hours. Coagulation profile No results for input(s): INR, PROTIME in the last 168 hours. COVID-19 Labs  Recent Labs    09/14/19 0239 09/15/19 0254 09/16/19 0310  DDIMER 2.00* 2.06* 2.07*  CRP 2.3* 1.5* 0.8    Lab Results  Component Value Date   SARSCOV2NAA NEGATIVE 08/09/2019   [place] NEGATIVE 05/07/2019    CBC: Recent Labs  Lab 09/12/19 1012 09/13/19 0055 09/14/19 0239 09/15/19 0254 09/16/19 0310  WBC 6.2 7.2 8.7 7.8 10.9*  NEUTROABS 3.9 5.1 6.2 5.6 7.8*  HGB 12.2* 12.9* 12.9* 13.1 13.2  HCT 39.2 40.4 39.9 41.2 42.8  MCV 91.2 89.6 88.1 89.8 91.1  PLT 277 411* 458* 431* 430*   Cardiac  Enzymes: No results for input(s): CKTOTAL, CKMB, CKMBINDEX, TROPONINI in the last 168 hours. BNP (last 3 results) No results for input(s): PROBNP in the last 8760 hours. CBG: Recent Labs  Lab 09/15/19 0727 09/15/19 1151 09/15/19 1652 09/15/19 2021 09/16/19 0731  GLUCAP 108* 125* 185* 270* 158*   D-Dimer: Recent Labs    09/15/19 0254 09/16/19 0310  DDIMER 2.06* 2.07*   Hgb A1c: No results for input(s): HGBA1C in the last 72 hours. Lipid Profile: No results for input(s): CHOL, HDL, LDLCALC, TRIG, CHOLHDL, LDLDIRECT in the last 72 hours. Thyroid function studies: No results for input(s): TSH, T4TOTAL, T3FREE, THYROIDAB in the last 72 hours.  Invalid input(s): FREET3 Anemia work up: No results for input(s): VITAMINB12, FOLATE, FERRITIN, TIBC, IRON, RETICCTPCT in the last 72 hours. Sepsis Labs: Recent Labs  Lab 09/13/19 0055 09/14/19 0239 09/15/19 0254 09/16/19 0310  WBC 7.2 8.7 7.8 10.9*   Microbiology Recent Results (from the past 240 hour(s))  Blood Culture (routine x 2)     Status: None   Collection Time: 09/08/19  3:43 AM   Specimen: BLOOD  Result Value Ref Range Status   Specimen Description BLOOD RIGHT ARM  Final   Special Requests   Final    BOTTLES DRAWN AEROBIC AND ANAEROBIC Blood Culture adequate volume Performed at [facility], [place] [address], [place], [place] [address]    Culture NO GROWTH 7 DAYS  Final   Report Status 09/15/2019 FINAL  Final  Blood Culture (routine x 2)     Status: None   Collection Time: 09/08/19  3:43 AM   Specimen: BLOOD  Result Value Ref Range Status   Specimen Description BLOOD LEFT ARM  Final   Special Requests   Final    BOTTLES DRAWN AEROBIC ONLY Blood Culture adequate volume Performed at [facility] [address], [place], [place] [address]    Culture NO GROWTH 7 DAYS  Final   Report Status 09/15/2019 FINAL  Final     Medications:   . sodium chloride   Intravenous Once  .  ascorbic acid  1,000 mg Oral Daily  . calcium-vitamin D  1 tablet Oral BID  . carvedilol  3.125 mg Oral BID  . cholecalciferol  2,000 Units Oral BID  . dexamethasone  6 mg Oral Daily  . enoxaparin (LOVENOX) injection  60 mg Subcutaneous Q24H  . galantamine  16 mg Oral Q breakfast  . insulin aspart  0-20 Units Subcutaneous TID WC  . insulin aspart  0-5 Units Subcutaneous QHS  . insulin glargine  5 Units Subcutaneous QHS  . iron polysaccharides  150 mg Oral Daily  . magnesium oxide  400 mg Oral BID  . Melatonin  5 mg Oral QHS  . methenamine  1 g Oral BID  . methocarbamol  750 mg Oral TID  . polyethylene glycol  17 g Oral Daily  . pravastatin  80 mg Oral QHS  . QUEtiapine  50 mg Oral QHS  . traZODone  50 mg Oral QHS  . vitamin B-12  500 mcg Oral BID   Continuous Infusions:     LOS: 8 days   Charlynne Cousins  [facility]  09/16/2019, 8:25 AM

## 2019-09-16 NOTE — Progress Notes (Signed)
Pt maintaining oxygen sats between 89-92 on RA.

## 2019-09-17 LAB — GLUCOSE, CAPILLARY
Glucose-Capillary: 147 mg/dL — ABNORMAL HIGH (ref 70–99)
Glucose-Capillary: 264 mg/dL — ABNORMAL HIGH (ref 70–99)
Glucose-Capillary: 208 mg/dL — ABNORMAL HIGH (ref 70–99)
Glucose-Capillary: 250 mg/dL — ABNORMAL HIGH (ref 70–99)

## 2019-09-17 LAB — CBC WITH DIFFERENTIAL/PLATELET
Abs Immature Granulocytes: 0.08 10*3/uL — ABNORMAL HIGH (ref 0.00–0.07)
Basophils Absolute: 0 10*3/uL (ref 0.0–0.1)
Basophils Relative: 0 %
Eosinophils Absolute: 0.1 10*3/uL (ref 0.0–0.5)
Eosinophils Relative: 0 %
HCT: 44.3 % (ref 39.0–52.0)
Hemoglobin: 13.3 g/dL (ref 13.0–17.0)
Immature Granulocytes: 0 %
Lymphocytes Relative: 11 %
Lymphs Abs: 2.1 10*3/uL (ref 0.7–4.0)
MCH: 28.1 pg (ref 26.0–34.0)
MCHC: 30 g/dL (ref 30.0–36.0)
MCV: 93.7 fL (ref 80.0–100.0)
Monocytes Absolute: 2 10*3/uL — ABNORMAL HIGH (ref 0.1–1.0)
Monocytes Relative: 11 %
Neutro Abs: 14.5 10*3/uL — ABNORMAL HIGH (ref 1.7–7.7)
Neutrophils Relative %: 78 %
Platelets: 428 10*3/uL — ABNORMAL HIGH (ref 150–400)
RBC: 4.73 MIL/uL (ref 4.22–5.81)
RDW: 13.7 % (ref 11.5–15.5)
WBC: 18.7 10*3/uL — ABNORMAL HIGH (ref 4.0–10.5)
nRBC: 0 % (ref 0.0–0.2)

## 2019-09-17 MED ORDER — SODIUM CHLORIDE 0.9 % IV BOLUS
500.0000 mL | Freq: Once | INTRAVENOUS | Status: AC
  Administered 2019-09-17: 500 mL via INTRAVENOUS

## 2019-09-17 MED ORDER — CARVEDILOL 3.125 MG PO TABS: 3.1250 mg | ORAL_TABLET | Freq: Two times a day (BID) | ORAL | Status: AC

## 2019-09-17 MED ORDER — SODIUM CHLORIDE 0.9 % IV SOLN: INTRAVENOUS | Status: AC

## 2019-09-17 MED ORDER — SODIUM CHLORIDE 0.9 % IV SOLN: INTRAVENOUS | Status: DC

## 2019-09-17 MED ORDER — TORSEMIDE 20 MG PO TABS: 20.0000 mg | ORAL_TABLET | Freq: Every day | ORAL | 0 refills | Status: AC

## 2019-09-17 NOTE — Discharge Summary (Addendum)
Physician Discharge Summary  [name] XBM:[id number] DOB: [birth year] DOA: 09/07/2019  PCP: [name], MD  Admit date: 09/07/2019 Discharge date: 09/18/2019  This dischage summary was dictated by Dr Aileen Fass on 1/19. I have updated the date today.   Admitted From: Home  Disposition:  SNF  Recommendations for Outpatient Follow-up:  1. Follow up with PCP in 1-2 weeks 2. Please obtain BMP/CBC in one week 3. Please follow up on the following pending results:  Home Health:Home  Equipment/Devices:None  Discharge Condition:stable CODE STATUS: DNR Diet recommendation: Heart Healthy  Brief/Interim Summary: 86 y.o. male past medical history significant for hypertension, PE, chronic pain with multiple back surgeries diabetes mellitus type 2 PTSD presents to the ED with cough and feeling weak was SARS-CoV-2 positive positive on 09/06/2018  Discharge Diagnoses:  Principal Problem:   COVID-19 Active Problems:   Chronic back pain   Hypertension   PTSD (post-traumatic stress disorder)   Diabetes ([place])   Pulmonary emboli (HCC)   Respiratory failure with hypoxia ([place]) Acute respiratory failure with hypoxia secondary to COVID-19 pneumonia:: He was placed on supplemental oxygen try to keep saturations greater than 93%. He was started on IV remdesivir and steroids for which he completed his course in-house, he was given convalescent plasma on 09/17/2018. The patient was not able to prone due to back issues. Physical therapy evaluated the patient and recommended a skilled nursing facility  Essential hypertension/hypotension: His antihypertensive medications were held on admission he was fluid resuscitated and blood pressure improved, as his blood pressure was trending up his antihypertensive medications were restarted but he eventually became hypotensive so they were held. All of his hypertensive medications have been on hold, and his blood pressure has been ranging 120/83-130 2/69  continue to hold these, would recommend starting his Coreg in a week and torsemide a week after that if his blood pressure can tolerate it.  Acute kidney injury: Likely prerenal azotemia in the setting of decreased oral intake and antihypertensive medication use, he was fluid resuscitated and his creatinine returned to baseline.  PTSD:  continue current medications no changes made  Chronic pain: Continue Robaxin.  Deconditioning: Physical therapy evaluated the patient.  Obstructive sleep apnea: Continue CPAP.  Controlled diabetes mellitus type 2 " No changes made to his medication.  Stage I sacral decubitus ulcer present on admission:  Discharge Instructions  Discharge Instructions    MyChart COVID-19 home monitoring program   Complete by: Sep 17, 2019    Is the patient willing to use the [place] for home monitoring?: No   Diet - low sodium heart healthy   Complete by: As directed    Increase activity slowly   Complete by: As directed      Allergies as of 09/18/2019      Reactions   Oxycontin [oxycodone Hcl] Other (See Comments)   Pt states he cant sleep w/ this med; also makes him constipated.       Medication List    STOP taking these medications   isosorbide mononitrate 30 MG 24 hr tablet Commonly known as: IMDUR   lisinopril 20 MG tablet Commonly known as: ZESTRIL   predniSONE 10 MG tablet Commonly known as: DELTASONE     TAKE these medications   Accu-Chek Aviva Plus test strip Generic drug: glucose blood Use one strip to test sugars. Pt checks sugars 4 times daily. Dx. E11.9   Accu-Chek Aviva Plus w/Device Kit Use glucometer to test sugars 4 times daily. Dx.  E11.9   acetaminophen 500 MG tablet Commonly known as: TYLENOL Take 1,000 mg by mouth every 8 (eight) hours as needed for mild pain.   albuterol (2.5 MG/3ML) 0.083% nebulizer solution Commonly known as: PROVENTIL Take 3 mLs (2.5 mg total) by nebulization every 2 (two) hours as  needed for wheezing.   ascorbic acid 500 MG tablet Commonly known as: VITAMIN C Take 1,000 mg by mouth daily.   calcium-vitamin D 500-200 MG-UNIT tablet Commonly known as: OSCAL WITH D Take 1 tablet by mouth 2 (two) times daily.   carvedilol 3.125 MG tablet Commonly known as: COREG Take 1 tablet (3.125 mg total) by mouth 2 (two) times daily. Start taking on: September 24, 2019 What changed:   when to take this  These instructions start on September 24, 2019. If you are unsure what to do until then, ask your doctor or other care provider.   diclofenac sodium 1 % Gel Commonly known as: VOLTAREN Apply 2 g topically 4 (four) times daily as needed (pain).   doxycycline 100 MG capsule Commonly known as: VIBRAMYCIN Take 1 capsule (100 mg total) by mouth 2 (two) times daily.   freestyle lancets Use as instructed to check sugars 4 times daily. Dx E11.9   galantamine 16 MG 24 hr capsule Commonly known as: RAZADYNE ER Take 16 mg by mouth daily with breakfast.   guaiFENesin-dextromethorphan 100-10 MG/5ML syrup Commonly known as: ROBITUSSIN DM Take 5 mLs by mouth every 4 (four) hours as needed for cough.   HumaLOG 100 UNIT/ML injection Generic drug: insulin lispro Inject 0-5 Units into the skin 3 (three) times daily before meals. 0-200 = 0 units, 201-250 = 2 units, 251-300 = 3 units, 301-350 = 4 units, 351-400 = 5 units, >401 call MD/NP   insulin glargine 100 UNIT/ML injection Commonly known as: LANTUS 10 units nightly What changed:   how much to take  how to take this  when to take this  additional instructions   iron polysaccharides 150 MG capsule Commonly known as: NIFEREX Take 1 capsule (150 mg total) by mouth daily.   lactose free nutrition Liqd Take 237 mLs by mouth daily as needed (nutrition).   Magnesium Oxide 420 (252 Mg) MG Tabs Take 420 mg by mouth 2 (two) times daily.   Melatonin 5 MG Tabs Take 5 mg by mouth at bedtime.   methenamine 1 g  tablet Commonly known as: HIPREX Take 1 tablet (1 g total) by mouth 2 (two) times daily.   methocarbamol 750 MG tablet Commonly known as: ROBAXIN Take 1 tablet (750 mg total) by mouth every 6 (six) hours as needed for muscle spasms. What changed:   when to take this  additional instructions   polyethylene glycol 17 g packet Commonly known as: MIRALAX / GLYCOLAX Take 17 g by mouth daily.   pravastatin 80 MG tablet Commonly known as: PRAVACHOL Take 80 mg by mouth at bedtime.   PRESERVISION AREDS 2 PO Take 1 capsule by mouth 2 (two) times daily.   QUEtiapine 50 MG tablet Commonly known as: SEROQUEL Take 50 mg by mouth at bedtime.   Systane Balance 0.6 % Soln Generic drug: Propylene Glycol Place 1 drop into both eyes 3 (three) times daily as needed (dry eyes).   torsemide 20 MG tablet Commonly known as: DEMADEX Take 1 tablet (20 mg total) by mouth daily. Start taking on: September 23, 2019 What changed:   when to take this  These instructions start on September 23, 2019. If  you are unsure what to do until then, ask your doctor or other care provider.   traZODone 50 MG tablet Commonly known as: DESYREL Take 50 mg by mouth at bedtime.   vitamin B-12 500 MCG tablet Commonly known as: CYANOCOBALAMIN Take 500 mcg by mouth 2 (two) times daily.   Vitamin D3 50 MCG (2000 UT) capsule Take 2,000 Units by mouth 2 (two) times daily.       Allergies  Allergen Reactions  . Oxycontin [Oxycodone Hcl] Other (See Comments)    Pt states he cant sleep w/ this med; also makes him constipated.     Consultations:  None   Procedures/Studies: DG CHEST PORT 1 VIEW  Result Date: 09/16/2019 CLINICAL DATA:  Evaluate leukocytosis. EXAM: PORTABLE CHEST 1 VIEW COMPARISON:  09/07/2019 FINDINGS: Lung volumes are low. Small right pleural effusion identified. Unchanged. There are opacities identified within both lung bases which may represent interstitial atelectasis or pneumonia.  IMPRESSION: Lungs are suboptimally inflated. There are bibasilar opacities which may represent atelectasis or pneumonia. Small right pleural effusion.  Similar to previous exam Electronically Signed   By: Kerby Moors M.D.   On: 09/16/2019 09:23   DG Chest Port 1 View  Result Date: 09/07/2019 CLINICAL DATA:  Shortness of breath EXAM: PORTABLE CHEST 1 VIEW COMPARISON:  August 09, 2019 FINDINGS: Stable cardiomegaly. The hila and mediastinum are unchanged. No pneumothorax. No nodules or masses. Mild vascular crowding in the medial left lung base. Mild haziness over the right base. IMPRESSION: Mild haziness over the right base may represent subtle infiltrate or a small layering effusion with underlying atelectasis. Electronically Signed   By: Dorise Bullion III M.D   On: 09/07/2019 18:04    Subjective: Feels lousy he relates.  Discharge Exam: Vitals:   09/18/19 0543 09/18/19 0806  BP: 111/63   Pulse: 78   Resp: 16   Temp: (!) 97.5 F (36.4 C)   SpO2: 91% 91%   Vitals:   09/17/19 1458 09/17/19 2142 09/18/19 0543 09/18/19 0806  BP: 115/62 113/77 111/63   Pulse: 85 89 78   Resp:  18 16   Temp:  98 F (36.7 C) (!) 97.5 F (36.4 C)   TempSrc:  Oral Oral   SpO2: 93% 92% 91% 91%  Weight:      Height:        General: Pt is alert, awake, not in acute distress Cardiovascular: RRR, S1/S2 +, no rubs, no gallops Respiratory: CTA bilaterally, no wheezing, no rhonchi Abdominal: Soft, NT, ND, bowel sounds + Extremities: no edema, no cyanosis    The results of significant diagnostics from this hospitalization (including imaging, microbiology, ancillary and laboratory) are listed below for reference.     Microbiology: No results found for this or any previous visit (from the past 240 hour(s)).   Labs: BNP (last 3 results) Recent Labs    08/09/19 1410 09/16/19 1531  BNP 95.8 14.4   Basic Metabolic Panel: Recent Labs  Lab 09/12/19 1012 09/13/19 0055 09/14/19 0239  09/15/19 0254 09/16/19 0310  NA 137 139 136 140 143  K 4.4 3.9 3.9 3.8 4.0  CL 101 97* 95* 100 103  CO2 _0 32  GLUCOSE 133* 148* 148* 152* 211*  BUN 24* 36* 66* 70* 65*  CREATININE 0.73 1.06 1.52* 1.22 0.97  CALCIUM 8.6* 9.2 8.9 8.8* 9.2   Liver Function Tests: No results for input(s): AST, ALT, ALKPHOS, BILITOT, PROT, ALBUMIN in the last 168 hours. No results for  input(s): LIPASE, AMYLASE in the last 168 hours. No results for input(s): AMMONIA in the last 168 hours. CBC: Recent Labs  Lab 09/13/19 0055 09/14/19 0239 09/15/19 0254 09/16/19 0310 09/17/19 0950  WBC 7.2 8.7 7.8 10.9* 18.7*  NEUTROABS 5.1 6.2 5.6 7.8* 14.5*  HGB 12.9* 12.9* 13.1 13.2 13.3  HCT 40.4 39.9 41.2 42.8 44.3  MCV 89.6 88.1 89.8 91.1 93.7  PLT 411* 458* 431* 430* 428*   Cardiac Enzymes: No results for input(s): CKTOTAL, CKMB, CKMBINDEX, TROPONINI in the last 168 hours. BNP: Invalid input(s): POCBNP CBG: Recent Labs  Lab 09/17/19 1211 09/17/19 1640 09/17/19 2145 09/18/19 0731 09/18/19 1127  GLUCAP 264* 208* 250* 145* 199*   D-Dimer Recent Labs    09/16/19 0310  DDIMER 2.07*   Hgb A1c No results for input(s): HGBA1C in the last 72 hours. Lipid Profile No results for input(s): CHOL, HDL, LDLCALC, TRIG, CHOLHDL, LDLDIRECT in the last 72 hours. Thyroid function studies No results for input(s): TSH, T4TOTAL, T3FREE, THYROIDAB in the last 72 hours.  Invalid input(s): FREET3 Anemia work up No results for input(s): VITAMINB12, FOLATE, FERRITIN, TIBC, IRON, RETICCTPCT in the last 72 hours. Urinalysis    Component Value Date/Time   COLORURINE YELLOW 08/09/2019 1700   APPEARANCEUR HAZY (A) 08/09/2019 1700   LABSPEC 1.012 08/09/2019 1700   PHURINE 7.0 08/09/2019 1700   GLUCOSEU NEGATIVE 08/09/2019 1700   HGBUR NEGATIVE 08/09/2019 1700   BILIRUBINUR NEGATIVE 08/09/2019 1700   BILIRUBINUR negative 12/30/2016 1308   KETONESUR NEGATIVE 08/09/2019 1700   PROTEINUR NEGATIVE  08/09/2019 1700   UROBILINOGEN 0.2 12/30/2016 1308   UROBILINOGEN 1.0 11/22/2012 1633   NITRITE NEGATIVE 08/09/2019 1700   LEUKOCYTESUR TRACE (A) 08/09/2019 1700   Sepsis Labs Invalid input(s): PROCALCITONIN,  WBC,  LACTICIDVEN Microbiology No results found for this or any previous visit (from the past 240 hour(s)).   Time coordinating discharge: Over 40 minutes  SIGNED:   Debbe Odea, MD  [facility] 09/18/2019, 11:33 AM Pager   If 7PM-7AM, please contact night-coverage [email or web address] Password [email or web address]

## 2019-09-17 NOTE — Plan of Care (Signed)
  Problem: Activity: Goal: Risk for activity intolerance will decrease Outcome: Not Progressing   Problem: Coping: Goal: Level of anxiety will decrease Outcome: Not Progressing  Continue current POC

## 2019-09-17 NOTE — Progress Notes (Signed)
Physical Therapy Treatment Patient Details Name: [name] MRN: [id number] DOB: November 27, 1931 Today's Date: 09/17/2019    History of Present Illness 86 yo male admitted with, falls, DM, R residual weakness, back sg, PE, PTSD    PT Comments    Pt with flat affect today, with occasional cursing but pt appears pretty down in spirits. Pt states multiple times "just do what you need to do" when PT tried to involve pt in mobility as much as possible. Pt required total assist +2 for supine<>sit, pt tolerated sitting up around 10 seconds before requesting to lay back down with pt stating "you are going to throw me in the floor" even though pt was safe, secure, and blocked by PT for entirety of EOB sitting. Pt encouraged to participate in LE exercises, participated minimally. PT to progress pt mobility as tolerated.   SpO2 - 87% on RA, placed on 2LO2 for recovery >90%   Follow Up Recommendations  SNF     Equipment Recommendations  None recommended by PT    Recommendations for Other Services       Precautions / Restrictions Precautions Precautions: Fall Restrictions Weight Bearing Restrictions: No    Mobility  Bed Mobility Overal bed mobility: Needs Assistance Bed Mobility: Rolling;Supine to Sit;Sit to Supine Rolling: Max assist;+2 for safety/equipment   Supine to sit: Total assist;+2 for physical assistance;+2 for safety/equipment;HOB elevated Sit to supine: Total assist;+2 for physical assistance;+2 for safety/equipment;HOB elevated   General bed mobility comments: max assist for roll to R for pad adjustment and pillow placement for sacral pressure relief. Total assist for supine<>sit for trunk and LE management, scooting to and from EOB, and scooting pt up in bed with use of bed pads. Pt assisting 0%, stating "do whatever you are going to do"  Transfers                 General transfer comment: unable  Ambulation/Gait             General Gait Details:  unable   Stairs             Wheelchair Mobility    Modified Rankin (Stroke Patients Only)       Balance Overall balance assessment: Needs assistance Sitting-balance support: Bilateral upper extremity supported;Feet supported Sitting balance-Leahy Scale: Zero Sitting balance - Comments: requires total assist for EOB sitting, sustained for ~10 seconds before pt posterior leaning to return to supine. Postural control: Posterior lean                                  Cognition Arousal/Alertness: Lethargic Behavior During Therapy: Flat affect;Restless Overall Cognitive Status: Within Functional Limits for tasks assessed                                 General Comments: pt drowsy this session with flat affect noted. Pt with some restlessness and irritation, especially during mobility.      Exercises General Exercises - Lower Extremity Ankle Circles/Pumps: PROM;AAROM;Both;10 reps;Supine Quad Sets: AAROM;Both;10 reps;Seated(with sustained hold at end range extension ~5 seconds due to hamstring tightness)    General Comments General comments (skin integrity, edema, etc.): SpO2 min 87% on RA, placed on 2LO2 during session and to recover      Pertinent Vitals/Pain Pain Assessment: Faces Faces Pain Scale: Hurts little more Pain Location: "all over" Pain Descriptors /  Indicators: Discomfort;Sore Pain Intervention(s): Limited activity within patient's tolerance;Monitored during session;Repositioned    Home Living                      Prior Function            PT Goals (current goals can now be found in the care plan section) Acute Rehab PT Goals Patient Stated Goal: to get better/stronger PT Goal Formulation: With patient Time For Goal Achievement: 09/25/19 Potential to Achieve Goals: Fair Progress towards PT goals: Progressing toward goals    Frequency    Min 2X/week      PT Plan Current plan remains appropriate     Co-evaluation              AM-PAC PT "6 Clicks" Mobility   Outcome Measure  Help needed turning from your back to your side while in a flat bed without using bedrails?: Total Help needed moving from lying on your back to sitting on the side of a flat bed without using bedrails?: Total Help needed moving to and from a bed to a chair (including a wheelchair)?: Total Help needed standing up from a chair using your arms (e.g., wheelchair or bedside chair)?: Total Help needed to walk in hospital room?: Total Help needed climbing 3-5 steps with a railing? : Total 6 Click Score: 6    End of Session Equipment Utilized During Treatment: Oxygen Activity Tolerance: Patient limited by fatigue;Patient limited by pain Patient left: in bed;with call bell/phone within reach;with bed alarm set Nurse Communication: Mobility status PT Visit Diagnosis: Muscle weakness (generalized) (M62.81);Other abnormalities of gait and mobility (R26.89)     Time: 8138-8719 PT Time Calculation (min) (ACUTE ONLY): 16 min  Charges:  $Therapeutic Activity: 8-22 mins                    Sharry Beining E, PT Acute Rehabilitation Services Pager [phone]  Office [phone]  Zera Markwardt D Oluwadarasimi Redmon 09/17/2019, 1:05 PM

## 2019-09-17 NOTE — TOC Progression Note (Signed)
Transition of Care [facility]) - Progression Note    Patient Details  Name: [name] MRN: [id number] Date of Birth: 06-29-1932  Transition of Care [facility]) CM/SW Contact  Ross Ludwig, [place] Phone Number: 09/17/2019, 1:30 PM  Clinical Narrative:    CSW spoke to patient's daughter and provided bed offers.  Patient's daughter chose [facility].  CSW spoke to [facility] and they have a bed available for him for tomorrow.  CSW updated physician and bedside nurse.    Expected Discharge Plan and Services           Expected Discharge Date: 09/17/19                                     Social Determinants of Health (SDOH) Interventions    Readmission Risk Interventions No flowsheet data found.

## 2019-09-17 NOTE — Plan of Care (Signed)
  Problem: Clinical Measurements: Goal: Ability to maintain clinical measurements within normal limits will improve Outcome: Progressing Goal: Will remain free from infection Outcome: Progressing Goal: Respiratory complications will improve Outcome: Progressing   Problem: Elimination: Goal: Will not experience complications related to bowel motility Outcome: Progressing Goal: Will not experience complications related to urinary retention Outcome: Progressing

## 2019-09-17 NOTE — Care Management Important Message (Signed)
Important Message  Patient Details IM Letter given to Evette Cristal SW Case Manager to present to the Patient Name: [name] MRN: [id number] Date of Birth: 10/21/1931   Medicare Important Message Given:  Yes     Kerin Salen 09/17/2019, 11:58 AM

## 2019-09-18 DIAGNOSIS — I959 Hypotension, unspecified: Secondary | ICD-10-CM | POA: Diagnosis not present

## 2019-09-18 DIAGNOSIS — R498 Other voice and resonance disorders: Secondary | ICD-10-CM | POA: Diagnosis not present

## 2019-09-18 DIAGNOSIS — R5381 Other malaise: Secondary | ICD-10-CM | POA: Diagnosis not present

## 2019-09-18 DIAGNOSIS — N179 Acute kidney failure, unspecified: Secondary | ICD-10-CM | POA: Diagnosis not present

## 2019-09-18 DIAGNOSIS — M2550 Pain in unspecified joint: Secondary | ICD-10-CM | POA: Diagnosis not present

## 2019-09-18 DIAGNOSIS — E119 Type 2 diabetes mellitus without complications: Secondary | ICD-10-CM | POA: Diagnosis not present

## 2019-09-18 DIAGNOSIS — Z794 Long term (current) use of insulin: Secondary | ICD-10-CM | POA: Diagnosis not present

## 2019-09-18 DIAGNOSIS — K5901 Slow transit constipation: Secondary | ICD-10-CM | POA: Diagnosis not present

## 2019-09-18 DIAGNOSIS — R0989 Other specified symptoms and signs involving the circulatory and respiratory systems: Secondary | ICD-10-CM | POA: Diagnosis not present

## 2019-09-18 DIAGNOSIS — Z029 Encounter for administrative examinations, unspecified: Secondary | ICD-10-CM | POA: Diagnosis not present

## 2019-09-18 DIAGNOSIS — G9349 Other encephalopathy: Secondary | ICD-10-CM | POA: Diagnosis not present

## 2019-09-18 DIAGNOSIS — J9601 Acute respiratory failure with hypoxia: Secondary | ICD-10-CM | POA: Diagnosis not present

## 2019-09-18 DIAGNOSIS — K5909 Other constipation: Secondary | ICD-10-CM | POA: Diagnosis not present

## 2019-09-18 DIAGNOSIS — N178 Other acute kidney failure: Secondary | ICD-10-CM | POA: Diagnosis not present

## 2019-09-18 DIAGNOSIS — D72829 Elevated white blood cell count, unspecified: Secondary | ICD-10-CM | POA: Diagnosis not present

## 2019-09-18 DIAGNOSIS — U071 COVID-19: Secondary | ICD-10-CM | POA: Diagnosis not present

## 2019-09-18 DIAGNOSIS — D473 Essential (hemorrhagic) thrombocythemia: Secondary | ICD-10-CM | POA: Diagnosis not present

## 2019-09-18 DIAGNOSIS — L89159 Pressure ulcer of sacral region, unspecified stage: Secondary | ICD-10-CM | POA: Diagnosis not present

## 2019-09-18 DIAGNOSIS — Z7401 Bed confinement status: Secondary | ICD-10-CM | POA: Diagnosis not present

## 2019-09-18 DIAGNOSIS — R1312 Dysphagia, oropharyngeal phase: Secondary | ICD-10-CM | POA: Diagnosis not present

## 2019-09-18 DIAGNOSIS — F4310 Post-traumatic stress disorder, unspecified: Secondary | ICD-10-CM | POA: Diagnosis not present

## 2019-09-18 DIAGNOSIS — R4189 Other symptoms and signs involving cognitive functions and awareness: Secondary | ICD-10-CM | POA: Diagnosis not present

## 2019-09-18 DIAGNOSIS — G8929 Other chronic pain: Secondary | ICD-10-CM | POA: Diagnosis not present

## 2019-09-18 DIAGNOSIS — R293 Abnormal posture: Secondary | ICD-10-CM | POA: Diagnosis not present

## 2019-09-18 DIAGNOSIS — J1282 Pneumonia due to coronavirus disease 2019: Secondary | ICD-10-CM | POA: Diagnosis not present

## 2019-09-18 DIAGNOSIS — F29 Unspecified psychosis not due to a substance or known physiological condition: Secondary | ICD-10-CM | POA: Diagnosis not present

## 2019-09-18 DIAGNOSIS — G4733 Obstructive sleep apnea (adult) (pediatric): Secondary | ICD-10-CM | POA: Diagnosis not present

## 2019-09-18 DIAGNOSIS — I9589 Other hypotension: Secondary | ICD-10-CM | POA: Diagnosis not present

## 2019-09-18 DIAGNOSIS — Z23 Encounter for immunization: Secondary | ICD-10-CM | POA: Diagnosis not present

## 2019-09-18 DIAGNOSIS — M6281 Muscle weakness (generalized): Secondary | ICD-10-CM | POA: Diagnosis not present

## 2019-09-18 DIAGNOSIS — R05 Cough: Secondary | ICD-10-CM | POA: Diagnosis not present

## 2019-09-18 DIAGNOSIS — R41841 Cognitive communication deficit: Secondary | ICD-10-CM | POA: Diagnosis not present

## 2019-09-18 LAB — GLUCOSE, CAPILLARY
Glucose-Capillary: 145 mg/dL — ABNORMAL HIGH (ref 70–99)
Glucose-Capillary: 199 mg/dL — ABNORMAL HIGH (ref 70–99)

## 2019-09-18 NOTE — Progress Notes (Signed)
Handoff given to Walker over the phone. D/c pending arrival of transport

## 2019-09-18 NOTE — TOC Transition Note (Signed)
Transition of Care [facility]) - CM/SW Discharge Note   Patient Details  Name: [name] MRN: [id number] Date of Birth: Apr 02, 1932  Transition of Care [facility]) CM/SW Contact:  Ross Ludwig, LCSW Phone Number: 09/18/2019, 1:05 PM   Clinical Narrative:     Patient to be d/c'ed today to [facility] room 705P.  Patient and family agreeable to plans will transport via ems RN to call report to [phone].  CSW spoke with patient's daughter [name] who is aware that patient is discharging today.   Final next level of care: Skilled Nursing Facility Barriers to Discharge: Barriers Resolved   Patient Goals and CMS Choice Patient states their goals for this hospitalization and ongoing recovery are:: To go to SNF for short term rehab, then return back home. CMS Medicare.gov Compare Post Acute Care list provided to:: Patient Represenative (must comment) Choice offered to / list presented to : Adult Children  Discharge Placement PASRR number recieved: 09/17/19            Patient chooses bed at: [facility] Patient to be transferred to facility by: [place] Name of family member notified: Daughter [name] Patient and family notified of of transfer: 09/18/19  Discharge Plan and Services                DME Arranged: N/A         HH Arranged: NA          Social Determinants of Health (SDOH) Interventions     Readmission Risk Interventions No flowsheet data found.

## 2019-09-18 NOTE — Plan of Care (Signed)
  Problem: Education: Goal: Knowledge of General Education information will improve Description: Including pain rating scale, medication(s)/side effects and non-pharmacologic comfort measures Outcome: Adequate for Discharge   Problem: Health Behavior/Discharge Planning: Goal: Ability to manage health-related needs will improve Outcome: Adequate for Discharge   Problem: Clinical Measurements: Goal: Ability to maintain clinical measurements within normal limits will improve Outcome: Adequate for Discharge Goal: Will remain free from infection Outcome: Adequate for Discharge Goal: Diagnostic test results will improve Outcome: Adequate for Discharge Goal: Respiratory complications will improve Outcome: Adequate for Discharge Goal: Cardiovascular complication will be avoided Outcome: Adequate for Discharge   Problem: Activity: Pt d/c with ambulance transfer service to [facility]. NAE  Goal: Risk for activity intolerance will decrease Outcome: Adequate for Discharge   Problem: Coping: Goal: Level of anxiety will decrease Outcome: Adequate for Discharge   Problem: Elimination: Goal: Will not experience complications related to bowel motility Outcome: Adequate for Discharge Goal: Will not experience complications related to urinary retention Outcome: Adequate for Discharge   Problem: Safety: Goal: Ability to remain free from injury will improve Outcome: Adequate for Discharge   Problem: Skin Integrity: Goal: Risk for impaired skin integrity will decrease Outcome: Adequate for Discharge

## 2019-09-18 NOTE — Plan of Care (Signed)
Patient is being discharged to [facility], transportation provided by [facility]. Report was called to [facility] and all questions were answered.

## 2019-09-19 ENCOUNTER — Telehealth: Payer: Self-pay

## 2019-09-19 ENCOUNTER — Other Ambulatory Visit: Payer: Self-pay | Admitting: *Deleted

## 2019-09-19 DIAGNOSIS — G9349 Other encephalopathy: Secondary | ICD-10-CM | POA: Diagnosis not present

## 2019-09-19 DIAGNOSIS — U071 COVID-19: Secondary | ICD-10-CM | POA: Diagnosis not present

## 2019-09-19 DIAGNOSIS — E119 Type 2 diabetes mellitus without complications: Secondary | ICD-10-CM | POA: Diagnosis not present

## 2019-09-19 DIAGNOSIS — R5381 Other malaise: Secondary | ICD-10-CM | POA: Diagnosis not present

## 2019-09-19 DIAGNOSIS — G4733 Obstructive sleep apnea (adult) (pediatric): Secondary | ICD-10-CM | POA: Diagnosis not present

## 2019-09-19 DIAGNOSIS — R0989 Other specified symptoms and signs involving the circulatory and respiratory systems: Secondary | ICD-10-CM | POA: Diagnosis not present

## 2019-09-19 DIAGNOSIS — D473 Essential (hemorrhagic) thrombocythemia: Secondary | ICD-10-CM | POA: Diagnosis not present

## 2019-09-19 DIAGNOSIS — G8929 Other chronic pain: Secondary | ICD-10-CM | POA: Diagnosis not present

## 2019-09-19 DIAGNOSIS — J9601 Acute respiratory failure with hypoxia: Secondary | ICD-10-CM | POA: Diagnosis not present

## 2019-09-19 DIAGNOSIS — N178 Other acute kidney failure: Secondary | ICD-10-CM | POA: Diagnosis not present

## 2019-09-19 DIAGNOSIS — F4310 Post-traumatic stress disorder, unspecified: Secondary | ICD-10-CM | POA: Diagnosis not present

## 2019-09-19 NOTE — Patient Outreach (Signed)
Screened for potential [facility] Care Management needs as a benefit of  [employer].  [name] is currently receiving skilled therapy at [facility].  Writer attended telephonic interdisciplinary team meeting to assess for disposition needs and transition plan for resident.   Noted member was active with [facility] (home based outpatient palliative care program administered by [facility]) prior to admission. Will collaborate with [facility] regarding re-engagement if/when [name] returns home.    Will continue to follow for transition plans and [facility] needs while member resides in SNF.   [name], MSN-Ed, RN,BSN [place] Acute Care Coordinator [phone] [facility]) [phone]  (Toll free office)

## 2019-09-19 NOTE — Telephone Encounter (Signed)
Paperwork should have already been completed.

## 2019-09-19 NOTE — Telephone Encounter (Signed)
Patient was discharged from hospital to [facility]. They have two phone calls set up with Dr. Keenan Bachelor, facility provider, tomorrow 1.22.21 and Monday 1.25.21 with the facility social worker.

## 2019-09-19 NOTE — Telephone Encounter (Signed)
Late entry- forms were already completed and placed in basket

## 2019-09-23 ENCOUNTER — Other Ambulatory Visit: Payer: Self-pay | Admitting: *Deleted

## 2019-09-23 NOTE — Patient Outreach (Signed)
Member screened for potential [facility] Care Management needs as a benefit of [place] [employer].  [name] is currently receiving skilled therapy at [facility].   Spoke with Manus Gunning with [facility] (outpatient palliative care program administered by [facility]) to confirm that [facility] will re-engage for services upon SNF discharge.   Will continue to follow and collaborate with facility and community partners while Mr. Denbleyker resides in SNF.    [name], MSN-Ed, RN,BSN [place] Acute Care Coordinator [phone] [facility]) [phone]  (Toll free office)

## 2019-09-26 ENCOUNTER — Other Ambulatory Visit: Payer: Self-pay | Admitting: *Deleted

## 2019-09-26 NOTE — Patient Outreach (Signed)
Screened for potential [facility] Care Management needs as a benefit of  [employer].  [name] is currently receiving skilled therapy at [facility].  Writer attended telephonic interdisciplinary team meeting to assess for disposition needs and transition plan for resident.   Facility reports disposition plan is for member to transfer to a [place] facility for placement.   Will continue to follow.  [name], MSN-Ed, RN,BSN [place] Acute Care Coordinator [phone] [facility]) [phone]  (Toll free office)

## 2019-09-27 DIAGNOSIS — F4310 Post-traumatic stress disorder, unspecified: Secondary | ICD-10-CM | POA: Diagnosis not present

## 2019-09-27 DIAGNOSIS — J9601 Acute respiratory failure with hypoxia: Secondary | ICD-10-CM | POA: Diagnosis not present

## 2019-09-27 DIAGNOSIS — U071 COVID-19: Secondary | ICD-10-CM | POA: Diagnosis not present

## 2019-09-27 DIAGNOSIS — R0989 Other specified symptoms and signs involving the circulatory and respiratory systems: Secondary | ICD-10-CM | POA: Diagnosis not present

## 2019-10-03 ENCOUNTER — Other Ambulatory Visit: Payer: Self-pay | Admitting: *Deleted

## 2019-10-03 NOTE — Patient Outreach (Signed)
Screened for potential [facility] Care Management needs as a benefit of  [employer].  [name] is currently receiving skilled therapy at [facility].   Writer attended telephonic interdisciplinary team meeting to assess for disposition needs and transition plan for resident.   [name] is not progressing with therapy. Last week facility dc planner mentioned that member's family is [facility].  Will plan outreach to family to discuss transition plan.  [name], MSN-Ed, RN,BSN [place] Acute Care Coordinator [phone] [facility]) [phone]  (Toll free office)

## 2019-10-04 DIAGNOSIS — R4189 Other symptoms and signs involving cognitive functions and awareness: Secondary | ICD-10-CM | POA: Diagnosis not present

## 2019-10-04 DIAGNOSIS — K5909 Other constipation: Secondary | ICD-10-CM | POA: Diagnosis not present

## 2019-10-04 DIAGNOSIS — R5381 Other malaise: Secondary | ICD-10-CM | POA: Diagnosis not present

## 2019-10-04 DIAGNOSIS — Z029 Encounter for administrative examinations, unspecified: Secondary | ICD-10-CM | POA: Diagnosis not present

## 2019-10-04 DIAGNOSIS — U071 COVID-19: Secondary | ICD-10-CM | POA: Diagnosis not present

## 2019-10-08 DIAGNOSIS — N179 Acute kidney failure, unspecified: Secondary | ICD-10-CM | POA: Diagnosis not present

## 2019-10-08 DIAGNOSIS — K5901 Slow transit constipation: Secondary | ICD-10-CM | POA: Diagnosis not present

## 2019-10-08 DIAGNOSIS — J9601 Acute respiratory failure with hypoxia: Secondary | ICD-10-CM | POA: Diagnosis not present

## 2019-10-08 DIAGNOSIS — I959 Hypotension, unspecified: Secondary | ICD-10-CM | POA: Diagnosis not present

## 2019-10-10 DIAGNOSIS — K5901 Slow transit constipation: Secondary | ICD-10-CM | POA: Diagnosis not present

## 2019-10-10 DIAGNOSIS — R05 Cough: Secondary | ICD-10-CM | POA: Diagnosis not present

## 2019-10-11 ENCOUNTER — Other Ambulatory Visit: Payer: Self-pay | Admitting: *Deleted

## 2019-10-11 DIAGNOSIS — E119 Type 2 diabetes mellitus without complications: Secondary | ICD-10-CM | POA: Diagnosis not present

## 2019-10-11 DIAGNOSIS — L89159 Pressure ulcer of sacral region, unspecified stage: Secondary | ICD-10-CM | POA: Diagnosis not present

## 2019-10-11 DIAGNOSIS — I9589 Other hypotension: Secondary | ICD-10-CM | POA: Diagnosis not present

## 2019-10-11 DIAGNOSIS — J9601 Acute respiratory failure with hypoxia: Secondary | ICD-10-CM | POA: Diagnosis not present

## 2019-10-11 DIAGNOSIS — U071 COVID-19: Secondary | ICD-10-CM | POA: Diagnosis not present

## 2019-10-11 DIAGNOSIS — N178 Other acute kidney failure: Secondary | ICD-10-CM | POA: Diagnosis not present

## 2019-10-11 DIAGNOSIS — F4310 Post-traumatic stress disorder, unspecified: Secondary | ICD-10-CM | POA: Diagnosis not present

## 2019-10-11 DIAGNOSIS — Z794 Long term (current) use of insulin: Secondary | ICD-10-CM | POA: Diagnosis not present

## 2019-10-11 DIAGNOSIS — R4189 Other symptoms and signs involving cognitive functions and awareness: Secondary | ICD-10-CM | POA: Diagnosis not present

## 2019-10-11 DIAGNOSIS — J1282 Pneumonia due to coronavirus disease 2019: Secondary | ICD-10-CM | POA: Diagnosis not present

## 2019-10-11 DIAGNOSIS — G4733 Obstructive sleep apnea (adult) (pediatric): Secondary | ICD-10-CM | POA: Diagnosis not present

## 2019-10-11 NOTE — Patient Outreach (Signed)
Late entry 10/10/19  Screened for potential [facility] Care Management needs as a benefit of  [employer].  Mr. Markman is currently receiving skilled therapy at [facility].   Writer attended telephonic interdisciplinary team meeting to assess for disposition needs and transition plan for resident.   Facility [employer] planner reports they are in the process of getting Mr. Valverde transferred to a [place] facility. Likely transition next week.  Will continue to follow while member resides in [facility].   [name], MSN-Ed, RN,BSN [place] Acute Care Coordinator [phone] [facility]) [phone]  (Toll free office)

## 2019-10-15 ENCOUNTER — Telehealth: Payer: Self-pay

## 2019-10-15 NOTE — Telephone Encounter (Signed)
FYI Patient's wife, Kaipo Ardis, called in to inform you that the patient has been moved to a permanent nursing facility in [facility]. ([facility] [place])

## 2019-10-15 NOTE — Telephone Encounter (Signed)
FYI

## 2019-10-16 DIAGNOSIS — E119 Type 2 diabetes mellitus without complications: Secondary | ICD-10-CM | POA: Diagnosis not present

## 2019-10-16 DIAGNOSIS — M1990 Unspecified osteoarthritis, unspecified site: Secondary | ICD-10-CM | POA: Diagnosis not present

## 2019-10-16 DIAGNOSIS — F0390 Unspecified dementia without behavioral disturbance: Secondary | ICD-10-CM | POA: Diagnosis not present

## 2019-10-16 DIAGNOSIS — I10 Essential (primary) hypertension: Secondary | ICD-10-CM | POA: Diagnosis not present

## 2019-10-21 ENCOUNTER — Other Ambulatory Visit: Payer: Self-pay | Admitting: *Deleted

## 2019-10-21 NOTE — Patient Outreach (Signed)
[facility] [place] Acute Care Coordinator follow up  [name] recently discharged from [facility] to [facility] under his [place] benefit.  No identifiable [facility] Care Management needs at this time.  [name], MSN-Ed, RN,BSN [place] Acute Care Coordinator [phone] [facility]) [phone]  (Toll free office)

## 2019-10-23 DIAGNOSIS — G8929 Other chronic pain: Secondary | ICD-10-CM | POA: Diagnosis not present

## 2019-10-23 DIAGNOSIS — M1990 Unspecified osteoarthritis, unspecified site: Secondary | ICD-10-CM | POA: Diagnosis not present

## 2019-10-23 DIAGNOSIS — R5381 Other malaise: Secondary | ICD-10-CM | POA: Diagnosis not present

## 2019-10-30 DIAGNOSIS — M1990 Unspecified osteoarthritis, unspecified site: Secondary | ICD-10-CM | POA: Diagnosis not present

## 2019-10-30 DIAGNOSIS — G8929 Other chronic pain: Secondary | ICD-10-CM | POA: Diagnosis not present

## 2019-11-25 DIAGNOSIS — E119 Type 2 diabetes mellitus without complications: Secondary | ICD-10-CM | POA: Diagnosis not present

## 2019-11-25 DIAGNOSIS — E11621 Type 2 diabetes mellitus with foot ulcer: Secondary | ICD-10-CM | POA: Diagnosis not present

## 2019-11-25 DIAGNOSIS — F0390 Unspecified dementia without behavioral disturbance: Secondary | ICD-10-CM | POA: Diagnosis not present

## 2019-12-05 DIAGNOSIS — R350 Frequency of micturition: Secondary | ICD-10-CM | POA: Diagnosis not present

## 2019-12-05 DIAGNOSIS — R300 Dysuria: Secondary | ICD-10-CM | POA: Diagnosis not present

## 2019-12-05 DIAGNOSIS — N390 Urinary tract infection, site not specified: Secondary | ICD-10-CM | POA: Diagnosis not present

## 2019-12-16 DIAGNOSIS — K5900 Constipation, unspecified: Secondary | ICD-10-CM | POA: Diagnosis not present

## 2020-03-16 DIAGNOSIS — R070 Pain in throat: Secondary | ICD-10-CM | POA: Diagnosis not present

## 2020-03-16 DIAGNOSIS — R062 Wheezing: Secondary | ICD-10-CM | POA: Diagnosis not present

## 2020-03-16 DIAGNOSIS — R531 Weakness: Secondary | ICD-10-CM | POA: Diagnosis not present

## 2020-03-16 DIAGNOSIS — J392 Other diseases of pharynx: Secondary | ICD-10-CM | POA: Diagnosis not present

## 2020-03-16 DIAGNOSIS — J029 Acute pharyngitis, unspecified: Secondary | ICD-10-CM | POA: Diagnosis not present

## 2020-03-16 DIAGNOSIS — F17290 Nicotine dependence, other tobacco product, uncomplicated: Secondary | ICD-10-CM | POA: Diagnosis not present

## 2020-04-08 DIAGNOSIS — Z7401 Bed confinement status: Secondary | ICD-10-CM | POA: Diagnosis not present

## 2020-04-08 DIAGNOSIS — R918 Other nonspecific abnormal finding of lung field: Secondary | ICD-10-CM | POA: Diagnosis not present

## 2020-04-08 DIAGNOSIS — R404 Transient alteration of awareness: Secondary | ICD-10-CM | POA: Diagnosis not present

## 2020-04-08 DIAGNOSIS — F329 Major depressive disorder, single episode, unspecified: Secondary | ICD-10-CM | POA: Diagnosis not present

## 2020-04-08 DIAGNOSIS — I10 Essential (primary) hypertension: Secondary | ICD-10-CM | POA: Diagnosis not present

## 2020-04-08 DIAGNOSIS — Z66 Do not resuscitate: Secondary | ICD-10-CM | POA: Diagnosis not present

## 2020-04-08 DIAGNOSIS — R0902 Hypoxemia: Secondary | ICD-10-CM | POA: Diagnosis not present

## 2020-04-08 DIAGNOSIS — R4182 Altered mental status, unspecified: Secondary | ICD-10-CM | POA: Diagnosis not present

## 2020-04-08 DIAGNOSIS — R0989 Other specified symptoms and signs involving the circulatory and respiratory systems: Secondary | ICD-10-CM | POA: Diagnosis not present

## 2020-04-08 DIAGNOSIS — I272 Pulmonary hypertension, unspecified: Secondary | ICD-10-CM | POA: Diagnosis not present

## 2020-04-08 DIAGNOSIS — D649 Anemia, unspecified: Secondary | ICD-10-CM | POA: Diagnosis not present

## 2020-04-08 DIAGNOSIS — R042 Hemoptysis: Secondary | ICD-10-CM | POA: Diagnosis not present

## 2020-04-08 DIAGNOSIS — I959 Hypotension, unspecified: Secondary | ICD-10-CM | POA: Diagnosis not present

## 2020-04-08 DIAGNOSIS — Z20822 Contact with and (suspected) exposure to covid-19: Secondary | ICD-10-CM | POA: Diagnosis not present

## 2020-04-08 DIAGNOSIS — F0390 Unspecified dementia without behavioral disturbance: Secondary | ICD-10-CM | POA: Diagnosis not present

## 2020-04-08 DIAGNOSIS — R579 Shock, unspecified: Secondary | ICD-10-CM | POA: Diagnosis not present

## 2020-04-08 DIAGNOSIS — R1312 Dysphagia, oropharyngeal phase: Secondary | ICD-10-CM | POA: Diagnosis not present

## 2020-04-08 DIAGNOSIS — E785 Hyperlipidemia, unspecified: Secondary | ICD-10-CM | POA: Diagnosis not present

## 2020-04-08 DIAGNOSIS — R0600 Dyspnea, unspecified: Secondary | ICD-10-CM | POA: Diagnosis not present

## 2020-04-08 DIAGNOSIS — E876 Hypokalemia: Secondary | ICD-10-CM | POA: Diagnosis not present

## 2020-04-08 DIAGNOSIS — E119 Type 2 diabetes mellitus without complications: Secondary | ICD-10-CM | POA: Diagnosis not present

## 2020-04-08 DIAGNOSIS — F4310 Post-traumatic stress disorder, unspecified: Secondary | ICD-10-CM | POA: Diagnosis not present

## 2020-04-08 DIAGNOSIS — Z86711 Personal history of pulmonary embolism: Secondary | ICD-10-CM | POA: Diagnosis not present

## 2020-04-08 DIAGNOSIS — M5030 Other cervical disc degeneration, unspecified cervical region: Secondary | ICD-10-CM | POA: Diagnosis not present

## 2020-04-08 DIAGNOSIS — E7800 Pure hypercholesterolemia, unspecified: Secondary | ICD-10-CM | POA: Diagnosis not present

## 2020-04-08 DIAGNOSIS — G894 Chronic pain syndrome: Secondary | ICD-10-CM | POA: Diagnosis not present

## 2020-04-08 DIAGNOSIS — R410 Disorientation, unspecified: Secondary | ICD-10-CM | POA: Diagnosis not present

## 2020-04-08 DIAGNOSIS — M19012 Primary osteoarthritis, left shoulder: Secondary | ICD-10-CM | POA: Diagnosis not present

## 2020-04-08 DIAGNOSIS — Z794 Long term (current) use of insulin: Secondary | ICD-10-CM | POA: Diagnosis not present

## 2020-04-08 DIAGNOSIS — J189 Pneumonia, unspecified organism: Secondary | ICD-10-CM | POA: Diagnosis not present

## 2020-04-08 DIAGNOSIS — Z79891 Long term (current) use of opiate analgesic: Secondary | ICD-10-CM | POA: Diagnosis not present

## 2020-04-08 DIAGNOSIS — R0602 Shortness of breath: Secondary | ICD-10-CM | POA: Diagnosis not present

## 2020-04-08 DIAGNOSIS — F419 Anxiety disorder, unspecified: Secondary | ICD-10-CM | POA: Diagnosis not present

## 2020-04-09 DIAGNOSIS — R1312 Dysphagia, oropharyngeal phase: Secondary | ICD-10-CM | POA: Diagnosis not present

## 2020-04-09 DIAGNOSIS — B3781 Candidal esophagitis: Secondary | ICD-10-CM | POA: Diagnosis not present

## 2020-04-09 DIAGNOSIS — J690 Pneumonitis due to inhalation of food and vomit: Secondary | ICD-10-CM | POA: Diagnosis not present

## 2020-04-10 DIAGNOSIS — J690 Pneumonitis due to inhalation of food and vomit: Secondary | ICD-10-CM | POA: Diagnosis not present

## 2020-04-10 DIAGNOSIS — R1312 Dysphagia, oropharyngeal phase: Secondary | ICD-10-CM | POA: Diagnosis not present

## 2020-04-10 DIAGNOSIS — B3781 Candidal esophagitis: Secondary | ICD-10-CM | POA: Diagnosis not present

## 2020-04-11 DIAGNOSIS — R1312 Dysphagia, oropharyngeal phase: Secondary | ICD-10-CM | POA: Diagnosis not present

## 2020-04-11 DIAGNOSIS — R0902 Hypoxemia: Secondary | ICD-10-CM | POA: Diagnosis not present

## 2020-04-11 DIAGNOSIS — B3781 Candidal esophagitis: Secondary | ICD-10-CM | POA: Diagnosis not present

## 2020-04-11 DIAGNOSIS — I10 Essential (primary) hypertension: Secondary | ICD-10-CM | POA: Diagnosis not present

## 2020-04-11 DIAGNOSIS — Z743 Need for continuous supervision: Secondary | ICD-10-CM | POA: Diagnosis not present

## 2020-04-11 DIAGNOSIS — J690 Pneumonitis due to inhalation of food and vomit: Secondary | ICD-10-CM | POA: Diagnosis not present

## 2020-04-20 DIAGNOSIS — R4182 Altered mental status, unspecified: Secondary | ICD-10-CM | POA: Diagnosis not present

## 2020-04-20 DIAGNOSIS — N390 Urinary tract infection, site not specified: Secondary | ICD-10-CM | POA: Diagnosis not present

## 2020-04-23 DIAGNOSIS — R1312 Dysphagia, oropharyngeal phase: Secondary | ICD-10-CM | POA: Diagnosis not present

## 2020-04-23 DIAGNOSIS — R0602 Shortness of breath: Secondary | ICD-10-CM | POA: Diagnosis not present

## 2020-04-23 DIAGNOSIS — J690 Pneumonitis due to inhalation of food and vomit: Secondary | ICD-10-CM | POA: Diagnosis not present

## 2020-04-23 DIAGNOSIS — M25512 Pain in left shoulder: Secondary | ICD-10-CM | POA: Diagnosis not present

## 2020-04-23 DIAGNOSIS — R4182 Altered mental status, unspecified: Secondary | ICD-10-CM | POA: Diagnosis not present

## 2020-04-23 DIAGNOSIS — E872 Acidosis: Secondary | ICD-10-CM | POA: Diagnosis not present

## 2020-04-23 DIAGNOSIS — J9602 Acute respiratory failure with hypercapnia: Secondary | ICD-10-CM | POA: Diagnosis not present

## 2020-04-23 DIAGNOSIS — R404 Transient alteration of awareness: Secondary | ICD-10-CM | POA: Diagnosis not present

## 2020-04-23 DIAGNOSIS — I10 Essential (primary) hypertension: Secondary | ICD-10-CM | POA: Diagnosis not present

## 2020-04-23 DIAGNOSIS — J9691 Respiratory failure, unspecified with hypoxia: Secondary | ICD-10-CM | POA: Diagnosis not present

## 2020-04-23 DIAGNOSIS — I4891 Unspecified atrial fibrillation: Secondary | ICD-10-CM | POA: Diagnosis not present

## 2020-04-23 DIAGNOSIS — I517 Cardiomegaly: Secondary | ICD-10-CM | POA: Diagnosis not present

## 2020-04-23 DIAGNOSIS — E875 Hyperkalemia: Secondary | ICD-10-CM | POA: Diagnosis not present

## 2020-04-23 DIAGNOSIS — F0390 Unspecified dementia without behavioral disturbance: Secondary | ICD-10-CM | POA: Diagnosis not present

## 2020-04-23 DIAGNOSIS — D72829 Elevated white blood cell count, unspecified: Secondary | ICD-10-CM | POA: Diagnosis not present

## 2020-04-23 DIAGNOSIS — Z7401 Bed confinement status: Secondary | ICD-10-CM | POA: Diagnosis not present

## 2020-04-23 DIAGNOSIS — Z20822 Contact with and (suspected) exposure to covid-19: Secondary | ICD-10-CM | POA: Diagnosis not present

## 2020-04-23 DIAGNOSIS — J189 Pneumonia, unspecified organism: Secondary | ICD-10-CM | POA: Diagnosis not present

## 2020-04-23 DIAGNOSIS — J9601 Acute respiratory failure with hypoxia: Secondary | ICD-10-CM | POA: Diagnosis not present

## 2020-04-24 DIAGNOSIS — J9602 Acute respiratory failure with hypercapnia: Secondary | ICD-10-CM | POA: Diagnosis not present

## 2020-04-24 DIAGNOSIS — J690 Pneumonitis due to inhalation of food and vomit: Secondary | ICD-10-CM | POA: Diagnosis not present

## 2020-04-24 DIAGNOSIS — J9601 Acute respiratory failure with hypoxia: Secondary | ICD-10-CM | POA: Diagnosis not present

## 2020-04-24 DIAGNOSIS — R1312 Dysphagia, oropharyngeal phase: Secondary | ICD-10-CM | POA: Diagnosis not present

## 2020-04-25 DIAGNOSIS — R1312 Dysphagia, oropharyngeal phase: Secondary | ICD-10-CM | POA: Diagnosis not present

## 2020-04-25 DIAGNOSIS — J690 Pneumonitis due to inhalation of food and vomit: Secondary | ICD-10-CM | POA: Diagnosis not present

## 2020-04-25 DIAGNOSIS — J9601 Acute respiratory failure with hypoxia: Secondary | ICD-10-CM | POA: Diagnosis not present

## 2020-04-25 DIAGNOSIS — J9602 Acute respiratory failure with hypercapnia: Secondary | ICD-10-CM | POA: Diagnosis not present

## 2020-04-25 DIAGNOSIS — I358 Other nonrheumatic aortic valve disorders: Secondary | ICD-10-CM | POA: Diagnosis not present

## 2020-04-26 DIAGNOSIS — J9601 Acute respiratory failure with hypoxia: Secondary | ICD-10-CM | POA: Diagnosis not present

## 2020-04-26 DIAGNOSIS — R1312 Dysphagia, oropharyngeal phase: Secondary | ICD-10-CM | POA: Diagnosis not present

## 2020-04-26 DIAGNOSIS — J9602 Acute respiratory failure with hypercapnia: Secondary | ICD-10-CM | POA: Diagnosis not present

## 2020-04-26 DIAGNOSIS — J690 Pneumonitis due to inhalation of food and vomit: Secondary | ICD-10-CM | POA: Diagnosis not present

## 2020-04-27 DIAGNOSIS — J9601 Acute respiratory failure with hypoxia: Secondary | ICD-10-CM | POA: Diagnosis not present

## 2020-04-27 DIAGNOSIS — J690 Pneumonitis due to inhalation of food and vomit: Secondary | ICD-10-CM | POA: Diagnosis not present

## 2020-04-27 DIAGNOSIS — R1312 Dysphagia, oropharyngeal phase: Secondary | ICD-10-CM | POA: Diagnosis not present

## 2020-04-27 DIAGNOSIS — J9602 Acute respiratory failure with hypercapnia: Secondary | ICD-10-CM | POA: Diagnosis not present

## 2020-04-28 DIAGNOSIS — R1312 Dysphagia, oropharyngeal phase: Secondary | ICD-10-CM | POA: Diagnosis not present

## 2020-04-28 DIAGNOSIS — J9601 Acute respiratory failure with hypoxia: Secondary | ICD-10-CM | POA: Diagnosis not present

## 2020-04-28 DIAGNOSIS — J9602 Acute respiratory failure with hypercapnia: Secondary | ICD-10-CM | POA: Diagnosis not present

## 2020-04-28 DIAGNOSIS — J690 Pneumonitis due to inhalation of food and vomit: Secondary | ICD-10-CM | POA: Diagnosis not present

## 2020-04-29 DIAGNOSIS — J9601 Acute respiratory failure with hypoxia: Secondary | ICD-10-CM | POA: Diagnosis not present

## 2020-04-29 DIAGNOSIS — Z743 Need for continuous supervision: Secondary | ICD-10-CM | POA: Diagnosis not present

## 2020-04-29 DIAGNOSIS — J690 Pneumonitis due to inhalation of food and vomit: Secondary | ICD-10-CM | POA: Diagnosis not present

## 2020-04-29 DIAGNOSIS — R531 Weakness: Secondary | ICD-10-CM | POA: Diagnosis not present

## 2020-04-29 DIAGNOSIS — J9602 Acute respiratory failure with hypercapnia: Secondary | ICD-10-CM | POA: Diagnosis not present

## 2020-04-29 DIAGNOSIS — R1312 Dysphagia, oropharyngeal phase: Secondary | ICD-10-CM | POA: Diagnosis not present

## 2020-05-18 DIAGNOSIS — A419 Sepsis, unspecified organism: Secondary | ICD-10-CM | POA: Diagnosis not present

## 2020-05-18 DIAGNOSIS — R4182 Altered mental status, unspecified: Secondary | ICD-10-CM | POA: Diagnosis not present

## 2020-05-18 DIAGNOSIS — G9341 Metabolic encephalopathy: Secondary | ICD-10-CM | POA: Diagnosis not present

## 2020-05-18 DIAGNOSIS — I959 Hypotension, unspecified: Secondary | ICD-10-CM | POA: Diagnosis not present

## 2020-05-18 DIAGNOSIS — R6521 Severe sepsis with septic shock: Secondary | ICD-10-CM | POA: Diagnosis not present

## 2020-05-19 DIAGNOSIS — D72829 Elevated white blood cell count, unspecified: Secondary | ICD-10-CM | POA: Diagnosis not present

## 2020-05-19 DIAGNOSIS — N179 Acute kidney failure, unspecified: Secondary | ICD-10-CM | POA: Diagnosis not present

## 2020-05-19 DIAGNOSIS — A419 Sepsis, unspecified organism: Secondary | ICD-10-CM | POA: Diagnosis not present

## 2020-05-19 DIAGNOSIS — N390 Urinary tract infection, site not specified: Secondary | ICD-10-CM | POA: Diagnosis not present

## 2020-05-20 DIAGNOSIS — N179 Acute kidney failure, unspecified: Secondary | ICD-10-CM | POA: Diagnosis not present

## 2020-05-20 DIAGNOSIS — N390 Urinary tract infection, site not specified: Secondary | ICD-10-CM | POA: Diagnosis not present

## 2020-05-20 DIAGNOSIS — A419 Sepsis, unspecified organism: Secondary | ICD-10-CM | POA: Diagnosis not present

## 2020-05-20 DIAGNOSIS — D72829 Elevated white blood cell count, unspecified: Secondary | ICD-10-CM | POA: Diagnosis not present

## 2020-05-21 DIAGNOSIS — A419 Sepsis, unspecified organism: Secondary | ICD-10-CM | POA: Diagnosis not present

## 2020-05-21 DIAGNOSIS — R404 Transient alteration of awareness: Secondary | ICD-10-CM | POA: Diagnosis not present

## 2020-05-21 DIAGNOSIS — Z7401 Bed confinement status: Secondary | ICD-10-CM | POA: Diagnosis not present

## 2020-05-21 DIAGNOSIS — N390 Urinary tract infection, site not specified: Secondary | ICD-10-CM | POA: Diagnosis not present

## 2020-05-21 DIAGNOSIS — N179 Acute kidney failure, unspecified: Secondary | ICD-10-CM | POA: Diagnosis not present

## 2020-05-21 DIAGNOSIS — R0902 Hypoxemia: Secondary | ICD-10-CM | POA: Diagnosis not present

## 2020-05-21 DIAGNOSIS — D72829 Elevated white blood cell count, unspecified: Secondary | ICD-10-CM | POA: Diagnosis not present

## 2020-05-29 DIAGNOSIS — N390 Urinary tract infection, site not specified: Secondary | ICD-10-CM | POA: Diagnosis not present

## 2020-06-21 DIAGNOSIS — S0000XA Unspecified superficial injury of scalp, initial encounter: Secondary | ICD-10-CM | POA: Diagnosis not present

## 2020-06-23 DIAGNOSIS — I10 Essential (primary) hypertension: Secondary | ICD-10-CM | POA: Diagnosis not present

## 2020-06-23 DIAGNOSIS — G894 Chronic pain syndrome: Secondary | ICD-10-CM | POA: Diagnosis not present

## 2020-06-23 DIAGNOSIS — E785 Hyperlipidemia, unspecified: Secondary | ICD-10-CM | POA: Diagnosis not present

## 2020-06-23 DIAGNOSIS — M25512 Pain in left shoulder: Secondary | ICD-10-CM | POA: Diagnosis not present

## 2020-06-23 DIAGNOSIS — K219 Gastro-esophageal reflux disease without esophagitis: Secondary | ICD-10-CM | POA: Diagnosis not present

## 2020-06-23 DIAGNOSIS — E119 Type 2 diabetes mellitus without complications: Secondary | ICD-10-CM | POA: Diagnosis not present

## 2020-06-24 DIAGNOSIS — M19012 Primary osteoarthritis, left shoulder: Secondary | ICD-10-CM | POA: Diagnosis not present

## 2020-06-30 DIAGNOSIS — M25512 Pain in left shoulder: Secondary | ICD-10-CM | POA: Diagnosis not present

## 2020-07-05 DIAGNOSIS — Z6841 Body Mass Index (BMI) 40.0 and over, adult: Secondary | ICD-10-CM | POA: Diagnosis not present

## 2020-07-05 DIAGNOSIS — R1310 Dysphagia, unspecified: Secondary | ICD-10-CM | POA: Diagnosis not present

## 2020-07-05 DIAGNOSIS — Z794 Long term (current) use of insulin: Secondary | ICD-10-CM | POA: Diagnosis not present

## 2020-07-05 DIAGNOSIS — K219 Gastro-esophageal reflux disease without esophagitis: Secondary | ICD-10-CM | POA: Diagnosis not present

## 2020-07-05 DIAGNOSIS — K5900 Constipation, unspecified: Secondary | ICD-10-CM | POA: Diagnosis not present

## 2020-07-05 DIAGNOSIS — R4182 Altered mental status, unspecified: Secondary | ICD-10-CM | POA: Diagnosis not present

## 2020-07-05 DIAGNOSIS — J9692 Respiratory failure, unspecified with hypercapnia: Secondary | ICD-10-CM | POA: Diagnosis not present

## 2020-07-05 DIAGNOSIS — F0390 Unspecified dementia without behavioral disturbance: Secondary | ICD-10-CM | POA: Diagnosis not present

## 2020-07-05 DIAGNOSIS — I10 Essential (primary) hypertension: Secondary | ICD-10-CM | POA: Diagnosis not present

## 2020-07-05 DIAGNOSIS — E669 Obesity, unspecified: Secondary | ICD-10-CM | POA: Diagnosis not present

## 2020-07-05 DIAGNOSIS — R0602 Shortness of breath: Secondary | ICD-10-CM | POA: Diagnosis not present

## 2020-07-05 DIAGNOSIS — Z20822 Contact with and (suspected) exposure to covid-19: Secondary | ICD-10-CM | POA: Diagnosis not present

## 2020-07-05 DIAGNOSIS — G9341 Metabolic encephalopathy: Secondary | ICD-10-CM | POA: Diagnosis not present

## 2020-07-05 DIAGNOSIS — G8929 Other chronic pain: Secondary | ICD-10-CM | POA: Diagnosis not present

## 2020-07-05 DIAGNOSIS — Z66 Do not resuscitate: Secondary | ICD-10-CM | POA: Diagnosis not present

## 2020-07-05 DIAGNOSIS — F17220 Nicotine dependence, chewing tobacco, uncomplicated: Secondary | ICD-10-CM | POA: Diagnosis not present

## 2020-07-05 DIAGNOSIS — Z8673 Personal history of transient ischemic attack (TIA), and cerebral infarction without residual deficits: Secondary | ICD-10-CM | POA: Diagnosis not present

## 2020-07-05 DIAGNOSIS — E119 Type 2 diabetes mellitus without complications: Secondary | ICD-10-CM | POA: Diagnosis not present

## 2020-07-05 DIAGNOSIS — E785 Hyperlipidemia, unspecified: Secondary | ICD-10-CM | POA: Diagnosis not present

## 2020-07-05 DIAGNOSIS — J189 Pneumonia, unspecified organism: Secondary | ICD-10-CM | POA: Diagnosis not present

## 2020-07-05 DIAGNOSIS — F32A Depression, unspecified: Secondary | ICD-10-CM | POA: Diagnosis not present

## 2020-07-06 DIAGNOSIS — F329 Major depressive disorder, single episode, unspecified: Secondary | ICD-10-CM | POA: Diagnosis not present

## 2020-07-06 DIAGNOSIS — G9340 Encephalopathy, unspecified: Secondary | ICD-10-CM | POA: Diagnosis not present

## 2020-07-06 DIAGNOSIS — I10 Essential (primary) hypertension: Secondary | ICD-10-CM | POA: Diagnosis not present

## 2020-07-06 DIAGNOSIS — E119 Type 2 diabetes mellitus without complications: Secondary | ICD-10-CM | POA: Diagnosis not present

## 2020-07-07 DIAGNOSIS — E119 Type 2 diabetes mellitus without complications: Secondary | ICD-10-CM | POA: Diagnosis not present

## 2020-07-07 DIAGNOSIS — R531 Weakness: Secondary | ICD-10-CM | POA: Diagnosis not present

## 2020-07-07 DIAGNOSIS — G9340 Encephalopathy, unspecified: Secondary | ICD-10-CM | POA: Diagnosis not present

## 2020-07-07 DIAGNOSIS — F329 Major depressive disorder, single episode, unspecified: Secondary | ICD-10-CM | POA: Diagnosis not present

## 2020-07-07 DIAGNOSIS — I10 Essential (primary) hypertension: Secondary | ICD-10-CM | POA: Diagnosis not present

## 2020-07-14 DIAGNOSIS — K5900 Constipation, unspecified: Secondary | ICD-10-CM | POA: Diagnosis not present

## 2020-07-14 DIAGNOSIS — G894 Chronic pain syndrome: Secondary | ICD-10-CM | POA: Diagnosis not present

## 2020-07-16 DIAGNOSIS — M25512 Pain in left shoulder: Secondary | ICD-10-CM | POA: Diagnosis not present

## 2020-08-02 DIAGNOSIS — R051 Acute cough: Secondary | ICD-10-CM | POA: Diagnosis not present

## 2020-08-02 DIAGNOSIS — R0981 Nasal congestion: Secondary | ICD-10-CM | POA: Diagnosis not present

## 2020-08-03 DIAGNOSIS — I5033 Acute on chronic diastolic (congestive) heart failure: Secondary | ICD-10-CM | POA: Diagnosis not present

## 2020-08-03 DIAGNOSIS — R059 Cough, unspecified: Secondary | ICD-10-CM | POA: Diagnosis not present

## 2020-08-03 DIAGNOSIS — R051 Acute cough: Secondary | ICD-10-CM | POA: Diagnosis not present

## 2020-08-04 DIAGNOSIS — M25512 Pain in left shoulder: Secondary | ICD-10-CM | POA: Diagnosis not present

## 2020-08-04 DIAGNOSIS — R059 Cough, unspecified: Secondary | ICD-10-CM | POA: Diagnosis not present

## 2020-08-04 DIAGNOSIS — Z79899 Other long term (current) drug therapy: Secondary | ICD-10-CM | POA: Diagnosis not present

## 2020-08-04 DIAGNOSIS — K5900 Constipation, unspecified: Secondary | ICD-10-CM | POA: Diagnosis not present

## 2020-08-07 DIAGNOSIS — K219 Gastro-esophageal reflux disease without esophagitis: Secondary | ICD-10-CM | POA: Diagnosis not present

## 2020-08-07 DIAGNOSIS — E119 Type 2 diabetes mellitus without complications: Secondary | ICD-10-CM | POA: Diagnosis not present

## 2020-08-07 DIAGNOSIS — I10 Essential (primary) hypertension: Secondary | ICD-10-CM | POA: Diagnosis not present

## 2020-08-07 DIAGNOSIS — G894 Chronic pain syndrome: Secondary | ICD-10-CM | POA: Diagnosis not present

## 2020-10-01 DIAGNOSIS — E785 Hyperlipidemia, unspecified: Secondary | ICD-10-CM | POA: Diagnosis not present

## 2020-10-01 DIAGNOSIS — G894 Chronic pain syndrome: Secondary | ICD-10-CM | POA: Diagnosis not present

## 2020-10-01 DIAGNOSIS — I10 Essential (primary) hypertension: Secondary | ICD-10-CM | POA: Diagnosis not present

## 2020-10-01 DIAGNOSIS — M25512 Pain in left shoulder: Secondary | ICD-10-CM | POA: Diagnosis not present

## 2020-10-01 DIAGNOSIS — E119 Type 2 diabetes mellitus without complications: Secondary | ICD-10-CM | POA: Diagnosis not present

## 2020-10-01 DIAGNOSIS — K5900 Constipation, unspecified: Secondary | ICD-10-CM | POA: Diagnosis not present

## 2020-10-09 DIAGNOSIS — G894 Chronic pain syndrome: Secondary | ICD-10-CM | POA: Diagnosis not present

## 2020-10-09 DIAGNOSIS — M25512 Pain in left shoulder: Secondary | ICD-10-CM | POA: Diagnosis not present

## 2020-10-15 DIAGNOSIS — R600 Localized edema: Secondary | ICD-10-CM | POA: Diagnosis not present

## 2020-10-15 DIAGNOSIS — E119 Type 2 diabetes mellitus without complications: Secondary | ICD-10-CM | POA: Diagnosis not present

## 2020-10-16 DIAGNOSIS — S91105A Unspecified open wound of left lesser toe(s) without damage to nail, initial encounter: Secondary | ICD-10-CM | POA: Diagnosis not present

## 2020-10-16 DIAGNOSIS — R600 Localized edema: Secondary | ICD-10-CM | POA: Diagnosis not present

## 2020-10-16 DIAGNOSIS — E119 Type 2 diabetes mellitus without complications: Secondary | ICD-10-CM | POA: Diagnosis not present

## 2020-10-17 DIAGNOSIS — I509 Heart failure, unspecified: Secondary | ICD-10-CM | POA: Diagnosis not present

## 2020-10-17 DIAGNOSIS — R0600 Dyspnea, unspecified: Secondary | ICD-10-CM | POA: Diagnosis not present

## 2020-10-17 DIAGNOSIS — R0602 Shortness of breath: Secondary | ICD-10-CM | POA: Diagnosis not present

## 2020-10-17 DIAGNOSIS — J9691 Respiratory failure, unspecified with hypoxia: Secondary | ICD-10-CM | POA: Diagnosis not present

## 2020-10-18 DIAGNOSIS — I214 Non-ST elevation (NSTEMI) myocardial infarction: Secondary | ICD-10-CM | POA: Diagnosis not present

## 2020-10-18 DIAGNOSIS — J189 Pneumonia, unspecified organism: Secondary | ICD-10-CM | POA: Diagnosis not present

## 2020-10-18 DIAGNOSIS — N179 Acute kidney failure, unspecified: Secondary | ICD-10-CM | POA: Diagnosis not present

## 2020-10-18 DIAGNOSIS — J9601 Acute respiratory failure with hypoxia: Secondary | ICD-10-CM | POA: Diagnosis not present

## 2020-10-19 DIAGNOSIS — J189 Pneumonia, unspecified organism: Secondary | ICD-10-CM | POA: Diagnosis not present

## 2020-10-19 DIAGNOSIS — N179 Acute kidney failure, unspecified: Secondary | ICD-10-CM | POA: Diagnosis not present

## 2020-10-19 DIAGNOSIS — I214 Non-ST elevation (NSTEMI) myocardial infarction: Secondary | ICD-10-CM | POA: Diagnosis not present

## 2020-10-19 DIAGNOSIS — J9601 Acute respiratory failure with hypoxia: Secondary | ICD-10-CM | POA: Diagnosis not present

## 2020-10-20 DIAGNOSIS — N179 Acute kidney failure, unspecified: Secondary | ICD-10-CM | POA: Diagnosis not present

## 2020-10-20 DIAGNOSIS — I214 Non-ST elevation (NSTEMI) myocardial infarction: Secondary | ICD-10-CM | POA: Diagnosis not present

## 2020-10-20 DIAGNOSIS — I10 Essential (primary) hypertension: Secondary | ICD-10-CM | POA: Diagnosis not present

## 2020-10-20 DIAGNOSIS — J9811 Atelectasis: Secondary | ICD-10-CM | POA: Diagnosis not present

## 2020-10-20 DIAGNOSIS — R531 Weakness: Secondary | ICD-10-CM | POA: Diagnosis not present

## 2020-10-20 DIAGNOSIS — J189 Pneumonia, unspecified organism: Secondary | ICD-10-CM | POA: Diagnosis not present

## 2020-10-20 DIAGNOSIS — J9601 Acute respiratory failure with hypoxia: Secondary | ICD-10-CM | POA: Diagnosis not present

## 2020-10-20 DIAGNOSIS — R519 Headache, unspecified: Secondary | ICD-10-CM | POA: Diagnosis not present

## 2020-10-21 DIAGNOSIS — S91105A Unspecified open wound of left lesser toe(s) without damage to nail, initial encounter: Secondary | ICD-10-CM | POA: Diagnosis not present

## 2020-10-21 DIAGNOSIS — E785 Hyperlipidemia, unspecified: Secondary | ICD-10-CM | POA: Diagnosis not present

## 2020-10-21 DIAGNOSIS — J155 Pneumonia due to Escherichia coli: Secondary | ICD-10-CM | POA: Diagnosis not present

## 2020-10-21 DIAGNOSIS — I10 Essential (primary) hypertension: Secondary | ICD-10-CM | POA: Diagnosis not present

## 2020-10-21 DIAGNOSIS — R600 Localized edema: Secondary | ICD-10-CM | POA: Diagnosis not present

## 2020-10-21 DIAGNOSIS — R531 Weakness: Secondary | ICD-10-CM | POA: Diagnosis not present

## 2020-10-23 DIAGNOSIS — J155 Pneumonia due to Escherichia coli: Secondary | ICD-10-CM | POA: Diagnosis not present

## 2020-10-23 DIAGNOSIS — I10 Essential (primary) hypertension: Secondary | ICD-10-CM | POA: Diagnosis not present

## 2020-10-23 DIAGNOSIS — S91105A Unspecified open wound of left lesser toe(s) without damage to nail, initial encounter: Secondary | ICD-10-CM | POA: Diagnosis not present

## 2020-10-27 DIAGNOSIS — R0603 Acute respiratory distress: Secondary | ICD-10-CM | POA: Diagnosis not present

## 2020-10-27 DIAGNOSIS — R404 Transient alteration of awareness: Secondary | ICD-10-CM | POA: Diagnosis not present

## 2020-10-27 DIAGNOSIS — R4182 Altered mental status, unspecified: Secondary | ICD-10-CM | POA: Diagnosis not present

## 2020-10-27 DIAGNOSIS — J9811 Atelectasis: Secondary | ICD-10-CM | POA: Diagnosis not present

## 2020-10-27 DIAGNOSIS — E785 Hyperlipidemia, unspecified: Secondary | ICD-10-CM | POA: Diagnosis not present

## 2020-10-27 DIAGNOSIS — D72829 Elevated white blood cell count, unspecified: Secondary | ICD-10-CM | POA: Diagnosis not present

## 2020-10-27 DIAGNOSIS — I10 Essential (primary) hypertension: Secondary | ICD-10-CM | POA: Diagnosis not present

## 2020-10-27 DIAGNOSIS — E119 Type 2 diabetes mellitus without complications: Secondary | ICD-10-CM | POA: Diagnosis not present

## 2020-10-27 DIAGNOSIS — R600 Localized edema: Secondary | ICD-10-CM | POA: Diagnosis not present

## 2020-10-28 DIAGNOSIS — E785 Hyperlipidemia, unspecified: Secondary | ICD-10-CM | POA: Diagnosis not present

## 2020-10-28 DIAGNOSIS — R404 Transient alteration of awareness: Secondary | ICD-10-CM | POA: Diagnosis not present

## 2020-10-28 DIAGNOSIS — Z7401 Bed confinement status: Secondary | ICD-10-CM | POA: Diagnosis not present

## 2020-10-28 DIAGNOSIS — E119 Type 2 diabetes mellitus without complications: Secondary | ICD-10-CM | POA: Diagnosis not present

## 2020-10-28 DIAGNOSIS — R4182 Altered mental status, unspecified: Secondary | ICD-10-CM | POA: Diagnosis not present

## 2020-10-28 DIAGNOSIS — D72829 Elevated white blood cell count, unspecified: Secondary | ICD-10-CM | POA: Diagnosis not present

## 2020-10-28 DIAGNOSIS — R410 Disorientation, unspecified: Secondary | ICD-10-CM | POA: Diagnosis not present

## 2020-10-28 DIAGNOSIS — R600 Localized edema: Secondary | ICD-10-CM | POA: Diagnosis not present

## 2020-10-28 DIAGNOSIS — R531 Weakness: Secondary | ICD-10-CM | POA: Diagnosis not present

## 2020-10-29 DIAGNOSIS — G894 Chronic pain syndrome: Secondary | ICD-10-CM | POA: Diagnosis not present

## 2020-10-29 DIAGNOSIS — Z8659 Personal history of other mental and behavioral disorders: Secondary | ICD-10-CM | POA: Diagnosis not present

## 2020-10-29 DIAGNOSIS — R0603 Acute respiratory distress: Secondary | ICD-10-CM | POA: Diagnosis not present

## 2020-10-29 DIAGNOSIS — R600 Localized edema: Secondary | ICD-10-CM | POA: Diagnosis not present

## 2020-11-02 DIAGNOSIS — I160 Hypertensive urgency: Secondary | ICD-10-CM | POA: Diagnosis not present

## 2020-11-02 DIAGNOSIS — R600 Localized edema: Secondary | ICD-10-CM | POA: Diagnosis not present

## 2020-11-03 DIAGNOSIS — G894 Chronic pain syndrome: Secondary | ICD-10-CM | POA: Diagnosis not present

## 2020-11-03 DIAGNOSIS — I160 Hypertensive urgency: Secondary | ICD-10-CM | POA: Diagnosis not present

## 2020-11-03 DIAGNOSIS — R0603 Acute respiratory distress: Secondary | ICD-10-CM | POA: Diagnosis not present

## 2020-11-10 DIAGNOSIS — I160 Hypertensive urgency: Secondary | ICD-10-CM | POA: Diagnosis not present

## 2020-11-10 DIAGNOSIS — E119 Type 2 diabetes mellitus without complications: Secondary | ICD-10-CM | POA: Diagnosis not present

## 2020-11-11 DIAGNOSIS — G894 Chronic pain syndrome: Secondary | ICD-10-CM | POA: Diagnosis not present

## 2020-11-11 DIAGNOSIS — E559 Vitamin D deficiency, unspecified: Secondary | ICD-10-CM | POA: Diagnosis not present

## 2020-11-11 DIAGNOSIS — E119 Type 2 diabetes mellitus without complications: Secondary | ICD-10-CM | POA: Diagnosis not present

## 2020-11-11 DIAGNOSIS — I110 Hypertensive heart disease with heart failure: Secondary | ICD-10-CM | POA: Diagnosis not present

## 2020-11-11 DIAGNOSIS — I10 Essential (primary) hypertension: Secondary | ICD-10-CM | POA: Diagnosis not present

## 2020-11-13 DIAGNOSIS — Z794 Long term (current) use of insulin: Secondary | ICD-10-CM | POA: Diagnosis not present

## 2020-11-13 DIAGNOSIS — E1165 Type 2 diabetes mellitus with hyperglycemia: Secondary | ICD-10-CM | POA: Diagnosis not present

## 2020-11-13 DIAGNOSIS — G4700 Insomnia, unspecified: Secondary | ICD-10-CM | POA: Diagnosis not present

## 2020-11-20 DIAGNOSIS — S91102A Unspecified open wound of left great toe without damage to nail, initial encounter: Secondary | ICD-10-CM | POA: Diagnosis not present

## 2020-11-20 DIAGNOSIS — E119 Type 2 diabetes mellitus without complications: Secondary | ICD-10-CM | POA: Diagnosis not present

## 2020-11-20 DIAGNOSIS — I10 Essential (primary) hypertension: Secondary | ICD-10-CM | POA: Diagnosis not present

## 2020-11-27 DIAGNOSIS — E1165 Type 2 diabetes mellitus with hyperglycemia: Secondary | ICD-10-CM | POA: Diagnosis not present

## 2020-11-27 DIAGNOSIS — Z794 Long term (current) use of insulin: Secondary | ICD-10-CM | POA: Diagnosis not present

## 2020-12-03 DIAGNOSIS — K219 Gastro-esophageal reflux disease without esophagitis: Secondary | ICD-10-CM | POA: Diagnosis not present

## 2020-12-03 DIAGNOSIS — E1165 Type 2 diabetes mellitus with hyperglycemia: Secondary | ICD-10-CM | POA: Diagnosis not present

## 2020-12-03 DIAGNOSIS — R600 Localized edema: Secondary | ICD-10-CM | POA: Diagnosis not present

## 2020-12-03 DIAGNOSIS — E785 Hyperlipidemia, unspecified: Secondary | ICD-10-CM | POA: Diagnosis not present

## 2020-12-03 DIAGNOSIS — G4700 Insomnia, unspecified: Secondary | ICD-10-CM | POA: Diagnosis not present

## 2020-12-03 DIAGNOSIS — I10 Essential (primary) hypertension: Secondary | ICD-10-CM | POA: Diagnosis not present

## 2020-12-03 DIAGNOSIS — F4310 Post-traumatic stress disorder, unspecified: Secondary | ICD-10-CM | POA: Diagnosis not present

## 2020-12-13 DIAGNOSIS — I959 Hypotension, unspecified: Secondary | ICD-10-CM | POA: Diagnosis not present

## 2020-12-13 DIAGNOSIS — R0602 Shortness of breath: Secondary | ICD-10-CM | POA: Diagnosis not present

## 2020-12-13 DIAGNOSIS — N179 Acute kidney failure, unspecified: Secondary | ICD-10-CM | POA: Diagnosis not present

## 2020-12-13 DIAGNOSIS — R4182 Altered mental status, unspecified: Secondary | ICD-10-CM | POA: Diagnosis not present

## 2020-12-14 DIAGNOSIS — J80 Acute respiratory distress syndrome: Secondary | ICD-10-CM | POA: Diagnosis not present

## 2020-12-14 DIAGNOSIS — R069 Unspecified abnormalities of breathing: Secondary | ICD-10-CM | POA: Diagnosis not present

## 2020-12-14 DIAGNOSIS — J189 Pneumonia, unspecified organism: Secondary | ICD-10-CM | POA: Diagnosis not present

## 2020-12-14 DIAGNOSIS — R0902 Hypoxemia: Secondary | ICD-10-CM | POA: Diagnosis not present

## 2020-12-14 DIAGNOSIS — J9602 Acute respiratory failure with hypercapnia: Secondary | ICD-10-CM | POA: Diagnosis not present

## 2020-12-14 DIAGNOSIS — J9601 Acute respiratory failure with hypoxia: Secondary | ICD-10-CM | POA: Diagnosis not present

## 2020-12-14 DIAGNOSIS — R404 Transient alteration of awareness: Secondary | ICD-10-CM | POA: Diagnosis not present

## 2020-12-14 DIAGNOSIS — G9341 Metabolic encephalopathy: Secondary | ICD-10-CM | POA: Diagnosis not present

## 2020-12-14 DIAGNOSIS — R6521 Severe sepsis with septic shock: Secondary | ICD-10-CM | POA: Diagnosis not present

## 2020-12-15 DIAGNOSIS — J9602 Acute respiratory failure with hypercapnia: Secondary | ICD-10-CM | POA: Diagnosis not present

## 2020-12-15 DIAGNOSIS — R6521 Severe sepsis with septic shock: Secondary | ICD-10-CM | POA: Diagnosis not present

## 2020-12-15 DIAGNOSIS — G9341 Metabolic encephalopathy: Secondary | ICD-10-CM | POA: Diagnosis not present

## 2020-12-15 DIAGNOSIS — J9601 Acute respiratory failure with hypoxia: Secondary | ICD-10-CM | POA: Diagnosis not present

## 2020-12-16 DIAGNOSIS — G9341 Metabolic encephalopathy: Secondary | ICD-10-CM | POA: Diagnosis not present

## 2020-12-16 DIAGNOSIS — J9622 Acute and chronic respiratory failure with hypercapnia: Secondary | ICD-10-CM | POA: Diagnosis not present

## 2020-12-16 DIAGNOSIS — J9621 Acute and chronic respiratory failure with hypoxia: Secondary | ICD-10-CM | POA: Diagnosis not present

## 2020-12-16 DIAGNOSIS — N179 Acute kidney failure, unspecified: Secondary | ICD-10-CM | POA: Diagnosis not present

## 2020-12-16 DIAGNOSIS — Z515 Encounter for palliative care: Secondary | ICD-10-CM | POA: Diagnosis not present

## 2020-12-16 DIAGNOSIS — J690 Pneumonitis due to inhalation of food and vomit: Secondary | ICD-10-CM | POA: Diagnosis not present

## 2020-12-16 DIAGNOSIS — J9601 Acute respiratory failure with hypoxia: Secondary | ICD-10-CM | POA: Diagnosis not present

## 2020-12-16 DIAGNOSIS — F0390 Unspecified dementia without behavioral disturbance: Secondary | ICD-10-CM | POA: Diagnosis not present

## 2020-12-17 DIAGNOSIS — J9622 Acute and chronic respiratory failure with hypercapnia: Secondary | ICD-10-CM | POA: Diagnosis not present

## 2020-12-17 DIAGNOSIS — J690 Pneumonitis due to inhalation of food and vomit: Secondary | ICD-10-CM | POA: Diagnosis not present

## 2020-12-17 DIAGNOSIS — G9341 Metabolic encephalopathy: Secondary | ICD-10-CM | POA: Diagnosis not present

## 2020-12-17 DIAGNOSIS — J9621 Acute and chronic respiratory failure with hypoxia: Secondary | ICD-10-CM | POA: Diagnosis not present

## 2020-12-18 DIAGNOSIS — J9621 Acute and chronic respiratory failure with hypoxia: Secondary | ICD-10-CM | POA: Diagnosis not present

## 2020-12-18 DIAGNOSIS — J690 Pneumonitis due to inhalation of food and vomit: Secondary | ICD-10-CM | POA: Diagnosis not present

## 2020-12-18 DIAGNOSIS — G9341 Metabolic encephalopathy: Secondary | ICD-10-CM | POA: Diagnosis not present

## 2020-12-18 DIAGNOSIS — J9622 Acute and chronic respiratory failure with hypercapnia: Secondary | ICD-10-CM | POA: Diagnosis not present

## 2020-12-19 DIAGNOSIS — J9622 Acute and chronic respiratory failure with hypercapnia: Secondary | ICD-10-CM | POA: Diagnosis not present

## 2020-12-19 DIAGNOSIS — I10 Essential (primary) hypertension: Secondary | ICD-10-CM | POA: Diagnosis not present

## 2020-12-19 DIAGNOSIS — G9341 Metabolic encephalopathy: Secondary | ICD-10-CM | POA: Diagnosis not present

## 2020-12-19 DIAGNOSIS — J690 Pneumonitis due to inhalation of food and vomit: Secondary | ICD-10-CM | POA: Diagnosis not present

## 2020-12-19 DIAGNOSIS — J9621 Acute and chronic respiratory failure with hypoxia: Secondary | ICD-10-CM | POA: Diagnosis not present

## 2020-12-19 DIAGNOSIS — R279 Unspecified lack of coordination: Secondary | ICD-10-CM | POA: Diagnosis not present

## 2020-12-19 DIAGNOSIS — Z7401 Bed confinement status: Secondary | ICD-10-CM | POA: Diagnosis not present

## 2021-01-08 DIAGNOSIS — J111 Influenza due to unidentified influenza virus with other respiratory manifestations: Secondary | ICD-10-CM | POA: Diagnosis not present

## 2021-01-09 DIAGNOSIS — R051 Acute cough: Secondary | ICD-10-CM | POA: Diagnosis not present

## 2021-01-10 DIAGNOSIS — R4182 Altered mental status, unspecified: Secondary | ICD-10-CM | POA: Diagnosis not present

## 2021-01-10 DIAGNOSIS — R0602 Shortness of breath: Secondary | ICD-10-CM | POA: Diagnosis not present

## 2021-01-10 DIAGNOSIS — J9691 Respiratory failure, unspecified with hypoxia: Secondary | ICD-10-CM | POA: Diagnosis not present

## 2021-01-10 DIAGNOSIS — J90 Pleural effusion, not elsewhere classified: Secondary | ICD-10-CM | POA: Diagnosis not present

## 2021-01-12 DIAGNOSIS — J9811 Atelectasis: Secondary | ICD-10-CM | POA: Diagnosis not present

## 2021-01-12 DIAGNOSIS — J811 Chronic pulmonary edema: Secondary | ICD-10-CM | POA: Diagnosis not present

## 2021-01-14 DIAGNOSIS — J811 Chronic pulmonary edema: Secondary | ICD-10-CM | POA: Diagnosis not present

## 2021-01-14 DIAGNOSIS — R531 Weakness: Secondary | ICD-10-CM | POA: Diagnosis not present

## 2021-01-19 DIAGNOSIS — J9601 Acute respiratory failure with hypoxia: Secondary | ICD-10-CM | POA: Diagnosis not present

## 2021-01-19 DIAGNOSIS — R918 Other nonspecific abnormal finding of lung field: Secondary | ICD-10-CM | POA: Diagnosis not present

## 2021-01-19 DIAGNOSIS — J690 Pneumonitis due to inhalation of food and vomit: Secondary | ICD-10-CM | POA: Diagnosis not present

## 2021-01-20 DIAGNOSIS — G9341 Metabolic encephalopathy: Secondary | ICD-10-CM | POA: Diagnosis not present

## 2021-01-20 DIAGNOSIS — R0902 Hypoxemia: Secondary | ICD-10-CM | POA: Diagnosis not present

## 2021-01-20 DIAGNOSIS — R4182 Altered mental status, unspecified: Secondary | ICD-10-CM | POA: Diagnosis not present

## 2021-01-20 DIAGNOSIS — F4310 Post-traumatic stress disorder, unspecified: Secondary | ICD-10-CM | POA: Diagnosis not present

## 2021-01-20 DIAGNOSIS — E876 Hypokalemia: Secondary | ICD-10-CM | POA: Diagnosis not present

## 2021-01-20 DIAGNOSIS — J9621 Acute and chronic respiratory failure with hypoxia: Secondary | ICD-10-CM | POA: Diagnosis not present

## 2021-01-20 DIAGNOSIS — G4700 Insomnia, unspecified: Secondary | ICD-10-CM | POA: Diagnosis not present

## 2021-01-20 DIAGNOSIS — Z66 Do not resuscitate: Secondary | ICD-10-CM | POA: Diagnosis not present

## 2021-01-20 DIAGNOSIS — I10 Essential (primary) hypertension: Secondary | ICD-10-CM | POA: Diagnosis not present

## 2021-01-20 DIAGNOSIS — Z20822 Contact with and (suspected) exposure to covid-19: Secondary | ICD-10-CM | POA: Diagnosis not present

## 2021-01-20 DIAGNOSIS — N390 Urinary tract infection, site not specified: Secondary | ICD-10-CM | POA: Diagnosis not present

## 2021-01-20 DIAGNOSIS — Z9119 Patient's noncompliance with other medical treatment and regimen: Secondary | ICD-10-CM | POA: Diagnosis not present

## 2021-01-20 DIAGNOSIS — J9602 Acute respiratory failure with hypercapnia: Secondary | ICD-10-CM | POA: Diagnosis not present

## 2021-01-20 DIAGNOSIS — Z79899 Other long term (current) drug therapy: Secondary | ICD-10-CM | POA: Diagnosis not present

## 2021-01-20 DIAGNOSIS — J189 Pneumonia, unspecified organism: Secondary | ICD-10-CM | POA: Diagnosis not present

## 2021-01-20 DIAGNOSIS — R531 Weakness: Secondary | ICD-10-CM | POA: Diagnosis not present

## 2021-01-20 DIAGNOSIS — Z794 Long term (current) use of insulin: Secondary | ICD-10-CM | POA: Diagnosis not present

## 2021-01-20 DIAGNOSIS — Z6837 Body mass index (BMI) 37.0-37.9, adult: Secondary | ICD-10-CM | POA: Diagnosis not present

## 2021-01-20 DIAGNOSIS — R0600 Dyspnea, unspecified: Secondary | ICD-10-CM | POA: Diagnosis not present

## 2021-01-20 DIAGNOSIS — J9622 Acute and chronic respiratory failure with hypercapnia: Secondary | ICD-10-CM | POA: Diagnosis not present

## 2021-01-20 DIAGNOSIS — N179 Acute kidney failure, unspecified: Secondary | ICD-10-CM | POA: Diagnosis not present

## 2021-01-20 DIAGNOSIS — K219 Gastro-esophageal reflux disease without esophagitis: Secondary | ICD-10-CM | POA: Diagnosis not present

## 2021-01-20 DIAGNOSIS — Z993 Dependence on wheelchair: Secondary | ICD-10-CM | POA: Diagnosis not present

## 2021-01-20 DIAGNOSIS — J9601 Acute respiratory failure with hypoxia: Secondary | ICD-10-CM | POA: Diagnosis not present

## 2021-01-20 DIAGNOSIS — R1310 Dysphagia, unspecified: Secondary | ICD-10-CM | POA: Diagnosis not present

## 2021-01-20 DIAGNOSIS — R401 Stupor: Secondary | ICD-10-CM | POA: Diagnosis not present

## 2021-01-20 DIAGNOSIS — Z86711 Personal history of pulmonary embolism: Secondary | ICD-10-CM | POA: Diagnosis not present

## 2021-01-20 DIAGNOSIS — A419 Sepsis, unspecified organism: Secondary | ICD-10-CM | POA: Diagnosis not present

## 2021-01-20 DIAGNOSIS — R6521 Severe sepsis with septic shock: Secondary | ICD-10-CM | POA: Diagnosis not present

## 2021-01-20 DIAGNOSIS — E785 Hyperlipidemia, unspecified: Secondary | ICD-10-CM | POA: Diagnosis not present

## 2021-01-20 DIAGNOSIS — E119 Type 2 diabetes mellitus without complications: Secondary | ICD-10-CM | POA: Diagnosis not present

## 2021-01-26 DIAGNOSIS — I10 Essential (primary) hypertension: Secondary | ICD-10-CM | POA: Diagnosis not present

## 2021-01-26 DIAGNOSIS — R1312 Dysphagia, oropharyngeal phase: Secondary | ICD-10-CM | POA: Diagnosis not present

## 2021-01-26 DIAGNOSIS — R0689 Other abnormalities of breathing: Secondary | ICD-10-CM | POA: Diagnosis not present

## 2021-01-26 DIAGNOSIS — J9601 Acute respiratory failure with hypoxia: Secondary | ICD-10-CM | POA: Diagnosis not present

## 2021-01-26 DIAGNOSIS — N390 Urinary tract infection, site not specified: Secondary | ICD-10-CM | POA: Diagnosis not present

## 2021-01-26 DIAGNOSIS — E119 Type 2 diabetes mellitus without complications: Secondary | ICD-10-CM | POA: Diagnosis not present

## 2021-01-28 DIAGNOSIS — R1312 Dysphagia, oropharyngeal phase: Secondary | ICD-10-CM | POA: Diagnosis not present

## 2021-01-28 DIAGNOSIS — R0689 Other abnormalities of breathing: Secondary | ICD-10-CM | POA: Diagnosis not present

## 2021-01-28 DIAGNOSIS — J690 Pneumonitis due to inhalation of food and vomit: Secondary | ICD-10-CM | POA: Diagnosis not present

## 2021-01-29 DIAGNOSIS — R0689 Other abnormalities of breathing: Secondary | ICD-10-CM | POA: Diagnosis not present

## 2021-01-29 DIAGNOSIS — J9601 Acute respiratory failure with hypoxia: Secondary | ICD-10-CM | POA: Diagnosis not present

## 2021-01-29 DIAGNOSIS — J690 Pneumonitis due to inhalation of food and vomit: Secondary | ICD-10-CM | POA: Diagnosis not present

## 2021-01-29 DIAGNOSIS — R1312 Dysphagia, oropharyngeal phase: Secondary | ICD-10-CM | POA: Diagnosis not present

## 2021-02-02 DIAGNOSIS — R1312 Dysphagia, oropharyngeal phase: Secondary | ICD-10-CM | POA: Diagnosis not present

## 2021-02-02 DIAGNOSIS — J9601 Acute respiratory failure with hypoxia: Secondary | ICD-10-CM | POA: Diagnosis not present

## 2021-02-02 DIAGNOSIS — R0689 Other abnormalities of breathing: Secondary | ICD-10-CM | POA: Diagnosis not present

## 2021-02-02 DIAGNOSIS — J690 Pneumonitis due to inhalation of food and vomit: Secondary | ICD-10-CM | POA: Diagnosis not present

## 2021-02-08 DIAGNOSIS — S161XXA Strain of muscle, fascia and tendon at neck level, initial encounter: Secondary | ICD-10-CM | POA: Diagnosis not present

## 2021-02-08 DIAGNOSIS — M542 Cervicalgia: Secondary | ICD-10-CM | POA: Diagnosis not present

## 2021-02-11 DIAGNOSIS — M542 Cervicalgia: Secondary | ICD-10-CM | POA: Diagnosis not present

## 2021-02-11 DIAGNOSIS — S161XXA Strain of muscle, fascia and tendon at neck level, initial encounter: Secondary | ICD-10-CM | POA: Diagnosis not present

## 2021-02-22 DIAGNOSIS — R4182 Altered mental status, unspecified: Secondary | ICD-10-CM | POA: Diagnosis not present

## 2021-02-22 DIAGNOSIS — F0391 Unspecified dementia with behavioral disturbance: Secondary | ICD-10-CM | POA: Diagnosis not present

## 2021-02-22 DIAGNOSIS — N390 Urinary tract infection, site not specified: Secondary | ICD-10-CM | POA: Diagnosis not present

## 2021-02-22 DIAGNOSIS — D72829 Elevated white blood cell count, unspecified: Secondary | ICD-10-CM | POA: Diagnosis not present

## 2021-02-22 DIAGNOSIS — N189 Chronic kidney disease, unspecified: Secondary | ICD-10-CM | POA: Diagnosis not present

## 2021-02-23 DIAGNOSIS — F0391 Unspecified dementia with behavioral disturbance: Secondary | ICD-10-CM | POA: Diagnosis not present

## 2021-02-23 DIAGNOSIS — N189 Chronic kidney disease, unspecified: Secondary | ICD-10-CM | POA: Diagnosis not present

## 2021-02-23 DIAGNOSIS — D72829 Elevated white blood cell count, unspecified: Secondary | ICD-10-CM | POA: Diagnosis not present

## 2021-02-23 DIAGNOSIS — N390 Urinary tract infection, site not specified: Secondary | ICD-10-CM | POA: Diagnosis not present

## 2021-02-24 ENCOUNTER — Encounter: Payer: Self-pay | Admitting: *Deleted

## 2021-02-24 DIAGNOSIS — F0391 Unspecified dementia with behavioral disturbance: Secondary | ICD-10-CM | POA: Diagnosis not present

## 2021-02-24 DIAGNOSIS — D72829 Elevated white blood cell count, unspecified: Secondary | ICD-10-CM | POA: Diagnosis not present

## 2021-02-24 DIAGNOSIS — N390 Urinary tract infection, site not specified: Secondary | ICD-10-CM | POA: Diagnosis not present

## 2021-02-24 DIAGNOSIS — R531 Weakness: Secondary | ICD-10-CM | POA: Diagnosis not present

## 2021-02-24 DIAGNOSIS — N189 Chronic kidney disease, unspecified: Secondary | ICD-10-CM | POA: Diagnosis not present

## 2021-02-26 DIAGNOSIS — J9621 Acute and chronic respiratory failure with hypoxia: Secondary | ICD-10-CM | POA: Diagnosis not present

## 2021-02-26 DIAGNOSIS — J189 Pneumonia, unspecified organism: Secondary | ICD-10-CM | POA: Diagnosis not present

## 2021-02-26 DIAGNOSIS — L89619 Pressure ulcer of right heel, unspecified stage: Secondary | ICD-10-CM | POA: Diagnosis not present

## 2021-02-26 DIAGNOSIS — F0390 Unspecified dementia without behavioral disturbance: Secondary | ICD-10-CM | POA: Diagnosis not present

## 2021-02-26 DIAGNOSIS — L89629 Pressure ulcer of left heel, unspecified stage: Secondary | ICD-10-CM | POA: Diagnosis not present

## 2021-02-26 DIAGNOSIS — B961 Klebsiella pneumoniae [K. pneumoniae] as the cause of diseases classified elsewhere: Secondary | ICD-10-CM | POA: Diagnosis not present

## 2021-02-26 DIAGNOSIS — N189 Chronic kidney disease, unspecified: Secondary | ICD-10-CM | POA: Diagnosis not present

## 2021-02-26 DIAGNOSIS — J690 Pneumonitis due to inhalation of food and vomit: Secondary | ICD-10-CM | POA: Diagnosis not present

## 2021-02-26 DIAGNOSIS — N390 Urinary tract infection, site not specified: Secondary | ICD-10-CM | POA: Diagnosis not present

## 2021-02-26 DIAGNOSIS — I517 Cardiomegaly: Secondary | ICD-10-CM | POA: Diagnosis not present

## 2021-02-26 DIAGNOSIS — L89159 Pressure ulcer of sacral region, unspecified stage: Secondary | ICD-10-CM | POA: Diagnosis not present

## 2021-02-26 DIAGNOSIS — R1310 Dysphagia, unspecified: Secondary | ICD-10-CM | POA: Diagnosis not present

## 2021-03-03 DIAGNOSIS — J690 Pneumonitis due to inhalation of food and vomit: Secondary | ICD-10-CM | POA: Diagnosis not present

## 2021-03-03 DIAGNOSIS — R1310 Dysphagia, unspecified: Secondary | ICD-10-CM | POA: Diagnosis not present

## 2021-03-03 DIAGNOSIS — B961 Klebsiella pneumoniae [K. pneumoniae] as the cause of diseases classified elsewhere: Secondary | ICD-10-CM | POA: Diagnosis not present

## 2021-03-03 DIAGNOSIS — J9621 Acute and chronic respiratory failure with hypoxia: Secondary | ICD-10-CM | POA: Diagnosis not present

## 2021-03-03 DIAGNOSIS — N390 Urinary tract infection, site not specified: Secondary | ICD-10-CM | POA: Diagnosis not present

## 2021-03-03 DIAGNOSIS — J189 Pneumonia, unspecified organism: Secondary | ICD-10-CM | POA: Diagnosis not present

## 2021-03-05 DIAGNOSIS — R1310 Dysphagia, unspecified: Secondary | ICD-10-CM | POA: Diagnosis not present

## 2021-03-05 DIAGNOSIS — J189 Pneumonia, unspecified organism: Secondary | ICD-10-CM | POA: Diagnosis not present

## 2021-03-05 DIAGNOSIS — J690 Pneumonitis due to inhalation of food and vomit: Secondary | ICD-10-CM | POA: Diagnosis not present

## 2021-03-05 DIAGNOSIS — N390 Urinary tract infection, site not specified: Secondary | ICD-10-CM | POA: Diagnosis not present

## 2021-03-05 DIAGNOSIS — J9621 Acute and chronic respiratory failure with hypoxia: Secondary | ICD-10-CM | POA: Diagnosis not present

## 2021-03-05 DIAGNOSIS — B961 Klebsiella pneumoniae [K. pneumoniae] as the cause of diseases classified elsewhere: Secondary | ICD-10-CM | POA: Diagnosis not present

## 2021-03-09 DIAGNOSIS — B961 Klebsiella pneumoniae [K. pneumoniae] as the cause of diseases classified elsewhere: Secondary | ICD-10-CM | POA: Diagnosis not present

## 2021-03-09 DIAGNOSIS — N390 Urinary tract infection, site not specified: Secondary | ICD-10-CM | POA: Diagnosis not present

## 2021-03-09 DIAGNOSIS — J690 Pneumonitis due to inhalation of food and vomit: Secondary | ICD-10-CM | POA: Diagnosis not present

## 2021-03-09 DIAGNOSIS — R1310 Dysphagia, unspecified: Secondary | ICD-10-CM | POA: Diagnosis not present

## 2021-03-09 DIAGNOSIS — J9621 Acute and chronic respiratory failure with hypoxia: Secondary | ICD-10-CM | POA: Diagnosis not present

## 2021-03-09 DIAGNOSIS — J189 Pneumonia, unspecified organism: Secondary | ICD-10-CM | POA: Diagnosis not present

## 2021-03-10 DIAGNOSIS — F39 Unspecified mood [affective] disorder: Secondary | ICD-10-CM | POA: Diagnosis not present

## 2021-03-10 DIAGNOSIS — R1310 Dysphagia, unspecified: Secondary | ICD-10-CM | POA: Diagnosis not present

## 2021-03-10 DIAGNOSIS — F4310 Post-traumatic stress disorder, unspecified: Secondary | ICD-10-CM | POA: Diagnosis not present

## 2021-03-10 DIAGNOSIS — F5105 Insomnia due to other mental disorder: Secondary | ICD-10-CM | POA: Diagnosis not present

## 2021-03-10 DIAGNOSIS — J690 Pneumonitis due to inhalation of food and vomit: Secondary | ICD-10-CM | POA: Diagnosis not present

## 2021-03-10 DIAGNOSIS — F0391 Unspecified dementia with behavioral disturbance: Secondary | ICD-10-CM | POA: Diagnosis not present

## 2021-03-10 DIAGNOSIS — N390 Urinary tract infection, site not specified: Secondary | ICD-10-CM | POA: Diagnosis not present

## 2021-03-10 DIAGNOSIS — J9621 Acute and chronic respiratory failure with hypoxia: Secondary | ICD-10-CM | POA: Diagnosis not present

## 2021-03-10 DIAGNOSIS — B961 Klebsiella pneumoniae [K. pneumoniae] as the cause of diseases classified elsewhere: Secondary | ICD-10-CM | POA: Diagnosis not present

## 2021-03-10 DIAGNOSIS — J189 Pneumonia, unspecified organism: Secondary | ICD-10-CM | POA: Diagnosis not present

## 2021-03-15 DIAGNOSIS — R1310 Dysphagia, unspecified: Secondary | ICD-10-CM | POA: Diagnosis not present

## 2021-03-15 DIAGNOSIS — J189 Pneumonia, unspecified organism: Secondary | ICD-10-CM | POA: Diagnosis not present

## 2021-03-15 DIAGNOSIS — J690 Pneumonitis due to inhalation of food and vomit: Secondary | ICD-10-CM | POA: Diagnosis not present

## 2021-03-15 DIAGNOSIS — B961 Klebsiella pneumoniae [K. pneumoniae] as the cause of diseases classified elsewhere: Secondary | ICD-10-CM | POA: Diagnosis not present

## 2021-03-15 DIAGNOSIS — N390 Urinary tract infection, site not specified: Secondary | ICD-10-CM | POA: Diagnosis not present

## 2021-03-15 DIAGNOSIS — J9621 Acute and chronic respiratory failure with hypoxia: Secondary | ICD-10-CM | POA: Diagnosis not present

## 2021-03-16 DIAGNOSIS — B961 Klebsiella pneumoniae [K. pneumoniae] as the cause of diseases classified elsewhere: Secondary | ICD-10-CM | POA: Diagnosis not present

## 2021-03-16 DIAGNOSIS — N390 Urinary tract infection, site not specified: Secondary | ICD-10-CM | POA: Diagnosis not present

## 2021-03-16 DIAGNOSIS — J9621 Acute and chronic respiratory failure with hypoxia: Secondary | ICD-10-CM | POA: Diagnosis not present

## 2021-03-16 DIAGNOSIS — J189 Pneumonia, unspecified organism: Secondary | ICD-10-CM | POA: Diagnosis not present

## 2021-03-16 DIAGNOSIS — J690 Pneumonitis due to inhalation of food and vomit: Secondary | ICD-10-CM | POA: Diagnosis not present

## 2021-03-16 DIAGNOSIS — R1310 Dysphagia, unspecified: Secondary | ICD-10-CM | POA: Diagnosis not present

## 2021-03-17 DIAGNOSIS — N390 Urinary tract infection, site not specified: Secondary | ICD-10-CM | POA: Diagnosis not present

## 2021-03-17 DIAGNOSIS — J189 Pneumonia, unspecified organism: Secondary | ICD-10-CM | POA: Diagnosis not present

## 2021-03-17 DIAGNOSIS — J690 Pneumonitis due to inhalation of food and vomit: Secondary | ICD-10-CM | POA: Diagnosis not present

## 2021-03-17 DIAGNOSIS — B961 Klebsiella pneumoniae [K. pneumoniae] as the cause of diseases classified elsewhere: Secondary | ICD-10-CM | POA: Diagnosis not present

## 2021-03-17 DIAGNOSIS — R1310 Dysphagia, unspecified: Secondary | ICD-10-CM | POA: Diagnosis not present

## 2021-03-17 DIAGNOSIS — J9621 Acute and chronic respiratory failure with hypoxia: Secondary | ICD-10-CM | POA: Diagnosis not present

## 2021-03-23 DIAGNOSIS — R1310 Dysphagia, unspecified: Secondary | ICD-10-CM | POA: Diagnosis not present

## 2021-03-23 DIAGNOSIS — J189 Pneumonia, unspecified organism: Secondary | ICD-10-CM | POA: Diagnosis not present

## 2021-03-23 DIAGNOSIS — N390 Urinary tract infection, site not specified: Secondary | ICD-10-CM | POA: Diagnosis not present

## 2021-03-23 DIAGNOSIS — J690 Pneumonitis due to inhalation of food and vomit: Secondary | ICD-10-CM | POA: Diagnosis not present

## 2021-03-23 DIAGNOSIS — B961 Klebsiella pneumoniae [K. pneumoniae] as the cause of diseases classified elsewhere: Secondary | ICD-10-CM | POA: Diagnosis not present

## 2021-03-23 DIAGNOSIS — J9621 Acute and chronic respiratory failure with hypoxia: Secondary | ICD-10-CM | POA: Diagnosis not present

## 2021-03-25 DIAGNOSIS — J9621 Acute and chronic respiratory failure with hypoxia: Secondary | ICD-10-CM | POA: Diagnosis not present

## 2021-03-25 DIAGNOSIS — R1310 Dysphagia, unspecified: Secondary | ICD-10-CM | POA: Diagnosis not present

## 2021-03-25 DIAGNOSIS — J189 Pneumonia, unspecified organism: Secondary | ICD-10-CM | POA: Diagnosis not present

## 2021-03-25 DIAGNOSIS — B961 Klebsiella pneumoniae [K. pneumoniae] as the cause of diseases classified elsewhere: Secondary | ICD-10-CM | POA: Diagnosis not present

## 2021-03-25 DIAGNOSIS — N390 Urinary tract infection, site not specified: Secondary | ICD-10-CM | POA: Diagnosis not present

## 2021-03-25 DIAGNOSIS — J690 Pneumonitis due to inhalation of food and vomit: Secondary | ICD-10-CM | POA: Diagnosis not present

## 2021-03-29 DIAGNOSIS — L89619 Pressure ulcer of right heel, unspecified stage: Secondary | ICD-10-CM | POA: Diagnosis not present

## 2021-03-29 DIAGNOSIS — L89159 Pressure ulcer of sacral region, unspecified stage: Secondary | ICD-10-CM | POA: Diagnosis not present

## 2021-03-29 DIAGNOSIS — R1310 Dysphagia, unspecified: Secondary | ICD-10-CM | POA: Diagnosis not present

## 2021-03-29 DIAGNOSIS — J690 Pneumonitis due to inhalation of food and vomit: Secondary | ICD-10-CM | POA: Diagnosis not present

## 2021-03-29 DIAGNOSIS — N189 Chronic kidney disease, unspecified: Secondary | ICD-10-CM | POA: Diagnosis not present

## 2021-03-29 DIAGNOSIS — N390 Urinary tract infection, site not specified: Secondary | ICD-10-CM | POA: Diagnosis not present

## 2021-03-29 DIAGNOSIS — J9621 Acute and chronic respiratory failure with hypoxia: Secondary | ICD-10-CM | POA: Diagnosis not present

## 2021-03-29 DIAGNOSIS — J189 Pneumonia, unspecified organism: Secondary | ICD-10-CM | POA: Diagnosis not present

## 2021-03-29 DIAGNOSIS — F0390 Unspecified dementia without behavioral disturbance: Secondary | ICD-10-CM | POA: Diagnosis not present

## 2021-03-29 DIAGNOSIS — L89629 Pressure ulcer of left heel, unspecified stage: Secondary | ICD-10-CM | POA: Diagnosis not present

## 2021-03-29 DIAGNOSIS — I517 Cardiomegaly: Secondary | ICD-10-CM | POA: Diagnosis not present

## 2021-03-29 DIAGNOSIS — B961 Klebsiella pneumoniae [K. pneumoniae] as the cause of diseases classified elsewhere: Secondary | ICD-10-CM | POA: Diagnosis not present

## 2021-03-30 DIAGNOSIS — R1310 Dysphagia, unspecified: Secondary | ICD-10-CM | POA: Diagnosis not present

## 2021-03-30 DIAGNOSIS — N390 Urinary tract infection, site not specified: Secondary | ICD-10-CM | POA: Diagnosis not present

## 2021-03-30 DIAGNOSIS — B961 Klebsiella pneumoniae [K. pneumoniae] as the cause of diseases classified elsewhere: Secondary | ICD-10-CM | POA: Diagnosis not present

## 2021-03-30 DIAGNOSIS — J690 Pneumonitis due to inhalation of food and vomit: Secondary | ICD-10-CM | POA: Diagnosis not present

## 2021-03-30 DIAGNOSIS — J9621 Acute and chronic respiratory failure with hypoxia: Secondary | ICD-10-CM | POA: Diagnosis not present

## 2021-03-30 DIAGNOSIS — J189 Pneumonia, unspecified organism: Secondary | ICD-10-CM | POA: Diagnosis not present

## 2021-03-31 DIAGNOSIS — F39 Unspecified mood [affective] disorder: Secondary | ICD-10-CM | POA: Diagnosis not present

## 2021-03-31 DIAGNOSIS — B961 Klebsiella pneumoniae [K. pneumoniae] as the cause of diseases classified elsewhere: Secondary | ICD-10-CM | POA: Diagnosis not present

## 2021-03-31 DIAGNOSIS — F4310 Post-traumatic stress disorder, unspecified: Secondary | ICD-10-CM | POA: Diagnosis not present

## 2021-03-31 DIAGNOSIS — J9621 Acute and chronic respiratory failure with hypoxia: Secondary | ICD-10-CM | POA: Diagnosis not present

## 2021-03-31 DIAGNOSIS — R1310 Dysphagia, unspecified: Secondary | ICD-10-CM | POA: Diagnosis not present

## 2021-03-31 DIAGNOSIS — F5105 Insomnia due to other mental disorder: Secondary | ICD-10-CM | POA: Diagnosis not present

## 2021-03-31 DIAGNOSIS — J690 Pneumonitis due to inhalation of food and vomit: Secondary | ICD-10-CM | POA: Diagnosis not present

## 2021-03-31 DIAGNOSIS — N390 Urinary tract infection, site not specified: Secondary | ICD-10-CM | POA: Diagnosis not present

## 2021-03-31 DIAGNOSIS — J189 Pneumonia, unspecified organism: Secondary | ICD-10-CM | POA: Diagnosis not present

## 2021-03-31 DIAGNOSIS — F0391 Unspecified dementia with behavioral disturbance: Secondary | ICD-10-CM | POA: Diagnosis not present

## 2021-04-06 DIAGNOSIS — J9621 Acute and chronic respiratory failure with hypoxia: Secondary | ICD-10-CM | POA: Diagnosis not present

## 2021-04-06 DIAGNOSIS — J189 Pneumonia, unspecified organism: Secondary | ICD-10-CM | POA: Diagnosis not present

## 2021-04-06 DIAGNOSIS — N390 Urinary tract infection, site not specified: Secondary | ICD-10-CM | POA: Diagnosis not present

## 2021-04-06 DIAGNOSIS — B961 Klebsiella pneumoniae [K. pneumoniae] as the cause of diseases classified elsewhere: Secondary | ICD-10-CM | POA: Diagnosis not present

## 2021-04-06 DIAGNOSIS — J690 Pneumonitis due to inhalation of food and vomit: Secondary | ICD-10-CM | POA: Diagnosis not present

## 2021-04-06 DIAGNOSIS — R1310 Dysphagia, unspecified: Secondary | ICD-10-CM | POA: Diagnosis not present

## 2021-04-07 DIAGNOSIS — R1310 Dysphagia, unspecified: Secondary | ICD-10-CM | POA: Diagnosis not present

## 2021-04-07 DIAGNOSIS — J690 Pneumonitis due to inhalation of food and vomit: Secondary | ICD-10-CM | POA: Diagnosis not present

## 2021-04-07 DIAGNOSIS — J9621 Acute and chronic respiratory failure with hypoxia: Secondary | ICD-10-CM | POA: Diagnosis not present

## 2021-04-07 DIAGNOSIS — J189 Pneumonia, unspecified organism: Secondary | ICD-10-CM | POA: Diagnosis not present

## 2021-04-07 DIAGNOSIS — B961 Klebsiella pneumoniae [K. pneumoniae] as the cause of diseases classified elsewhere: Secondary | ICD-10-CM | POA: Diagnosis not present

## 2021-04-07 DIAGNOSIS — N390 Urinary tract infection, site not specified: Secondary | ICD-10-CM | POA: Diagnosis not present

## 2021-04-08 DIAGNOSIS — J9621 Acute and chronic respiratory failure with hypoxia: Secondary | ICD-10-CM | POA: Diagnosis not present

## 2021-04-08 DIAGNOSIS — J189 Pneumonia, unspecified organism: Secondary | ICD-10-CM | POA: Diagnosis not present

## 2021-04-08 DIAGNOSIS — R1310 Dysphagia, unspecified: Secondary | ICD-10-CM | POA: Diagnosis not present

## 2021-04-08 DIAGNOSIS — B961 Klebsiella pneumoniae [K. pneumoniae] as the cause of diseases classified elsewhere: Secondary | ICD-10-CM | POA: Diagnosis not present

## 2021-04-08 DIAGNOSIS — N390 Urinary tract infection, site not specified: Secondary | ICD-10-CM | POA: Diagnosis not present

## 2021-04-08 DIAGNOSIS — J690 Pneumonitis due to inhalation of food and vomit: Secondary | ICD-10-CM | POA: Diagnosis not present

## 2021-04-12 DIAGNOSIS — R1310 Dysphagia, unspecified: Secondary | ICD-10-CM | POA: Diagnosis not present

## 2021-04-12 DIAGNOSIS — N390 Urinary tract infection, site not specified: Secondary | ICD-10-CM | POA: Diagnosis not present

## 2021-04-12 DIAGNOSIS — J690 Pneumonitis due to inhalation of food and vomit: Secondary | ICD-10-CM | POA: Diagnosis not present

## 2021-04-12 DIAGNOSIS — J9621 Acute and chronic respiratory failure with hypoxia: Secondary | ICD-10-CM | POA: Diagnosis not present

## 2021-04-12 DIAGNOSIS — J189 Pneumonia, unspecified organism: Secondary | ICD-10-CM | POA: Diagnosis not present

## 2021-04-12 DIAGNOSIS — B961 Klebsiella pneumoniae [K. pneumoniae] as the cause of diseases classified elsewhere: Secondary | ICD-10-CM | POA: Diagnosis not present

## 2021-04-13 DIAGNOSIS — B961 Klebsiella pneumoniae [K. pneumoniae] as the cause of diseases classified elsewhere: Secondary | ICD-10-CM | POA: Diagnosis not present

## 2021-04-13 DIAGNOSIS — N390 Urinary tract infection, site not specified: Secondary | ICD-10-CM | POA: Diagnosis not present

## 2021-04-13 DIAGNOSIS — J9621 Acute and chronic respiratory failure with hypoxia: Secondary | ICD-10-CM | POA: Diagnosis not present

## 2021-04-13 DIAGNOSIS — J690 Pneumonitis due to inhalation of food and vomit: Secondary | ICD-10-CM | POA: Diagnosis not present

## 2021-04-13 DIAGNOSIS — R1310 Dysphagia, unspecified: Secondary | ICD-10-CM | POA: Diagnosis not present

## 2021-04-13 DIAGNOSIS — J189 Pneumonia, unspecified organism: Secondary | ICD-10-CM | POA: Diagnosis not present

## 2021-04-14 DIAGNOSIS — B961 Klebsiella pneumoniae [K. pneumoniae] as the cause of diseases classified elsewhere: Secondary | ICD-10-CM | POA: Diagnosis not present

## 2021-04-14 DIAGNOSIS — N390 Urinary tract infection, site not specified: Secondary | ICD-10-CM | POA: Diagnosis not present

## 2021-04-14 DIAGNOSIS — J189 Pneumonia, unspecified organism: Secondary | ICD-10-CM | POA: Diagnosis not present

## 2021-04-14 DIAGNOSIS — J9621 Acute and chronic respiratory failure with hypoxia: Secondary | ICD-10-CM | POA: Diagnosis not present

## 2021-04-14 DIAGNOSIS — J690 Pneumonitis due to inhalation of food and vomit: Secondary | ICD-10-CM | POA: Diagnosis not present

## 2021-04-14 DIAGNOSIS — R1310 Dysphagia, unspecified: Secondary | ICD-10-CM | POA: Diagnosis not present

## 2021-04-19 DIAGNOSIS — B961 Klebsiella pneumoniae [K. pneumoniae] as the cause of diseases classified elsewhere: Secondary | ICD-10-CM | POA: Diagnosis not present

## 2021-04-19 DIAGNOSIS — J9621 Acute and chronic respiratory failure with hypoxia: Secondary | ICD-10-CM | POA: Diagnosis not present

## 2021-04-19 DIAGNOSIS — J690 Pneumonitis due to inhalation of food and vomit: Secondary | ICD-10-CM | POA: Diagnosis not present

## 2021-04-19 DIAGNOSIS — J189 Pneumonia, unspecified organism: Secondary | ICD-10-CM | POA: Diagnosis not present

## 2021-04-19 DIAGNOSIS — R1310 Dysphagia, unspecified: Secondary | ICD-10-CM | POA: Diagnosis not present

## 2021-04-19 DIAGNOSIS — N390 Urinary tract infection, site not specified: Secondary | ICD-10-CM | POA: Diagnosis not present

## 2021-04-20 DIAGNOSIS — J9621 Acute and chronic respiratory failure with hypoxia: Secondary | ICD-10-CM | POA: Diagnosis not present

## 2021-04-20 DIAGNOSIS — J189 Pneumonia, unspecified organism: Secondary | ICD-10-CM | POA: Diagnosis not present

## 2021-04-20 DIAGNOSIS — B961 Klebsiella pneumoniae [K. pneumoniae] as the cause of diseases classified elsewhere: Secondary | ICD-10-CM | POA: Diagnosis not present

## 2021-04-20 DIAGNOSIS — J690 Pneumonitis due to inhalation of food and vomit: Secondary | ICD-10-CM | POA: Diagnosis not present

## 2021-04-20 DIAGNOSIS — R1310 Dysphagia, unspecified: Secondary | ICD-10-CM | POA: Diagnosis not present

## 2021-04-20 DIAGNOSIS — N390 Urinary tract infection, site not specified: Secondary | ICD-10-CM | POA: Diagnosis not present

## 2021-04-21 DIAGNOSIS — B961 Klebsiella pneumoniae [K. pneumoniae] as the cause of diseases classified elsewhere: Secondary | ICD-10-CM | POA: Diagnosis not present

## 2021-04-21 DIAGNOSIS — J189 Pneumonia, unspecified organism: Secondary | ICD-10-CM | POA: Diagnosis not present

## 2021-04-21 DIAGNOSIS — N390 Urinary tract infection, site not specified: Secondary | ICD-10-CM | POA: Diagnosis not present

## 2021-04-21 DIAGNOSIS — J690 Pneumonitis due to inhalation of food and vomit: Secondary | ICD-10-CM | POA: Diagnosis not present

## 2021-04-21 DIAGNOSIS — J9621 Acute and chronic respiratory failure with hypoxia: Secondary | ICD-10-CM | POA: Diagnosis not present

## 2021-04-21 DIAGNOSIS — R1310 Dysphagia, unspecified: Secondary | ICD-10-CM | POA: Diagnosis not present

## 2021-04-26 DIAGNOSIS — R1310 Dysphagia, unspecified: Secondary | ICD-10-CM | POA: Diagnosis not present

## 2021-04-26 DIAGNOSIS — J690 Pneumonitis due to inhalation of food and vomit: Secondary | ICD-10-CM | POA: Diagnosis not present

## 2021-04-26 DIAGNOSIS — N390 Urinary tract infection, site not specified: Secondary | ICD-10-CM | POA: Diagnosis not present

## 2021-04-26 DIAGNOSIS — B961 Klebsiella pneumoniae [K. pneumoniae] as the cause of diseases classified elsewhere: Secondary | ICD-10-CM | POA: Diagnosis not present

## 2021-04-26 DIAGNOSIS — J9621 Acute and chronic respiratory failure with hypoxia: Secondary | ICD-10-CM | POA: Diagnosis not present

## 2021-04-26 DIAGNOSIS — J189 Pneumonia, unspecified organism: Secondary | ICD-10-CM | POA: Diagnosis not present

## 2021-04-27 DIAGNOSIS — J189 Pneumonia, unspecified organism: Secondary | ICD-10-CM | POA: Diagnosis not present

## 2021-04-27 DIAGNOSIS — B961 Klebsiella pneumoniae [K. pneumoniae] as the cause of diseases classified elsewhere: Secondary | ICD-10-CM | POA: Diagnosis not present

## 2021-04-27 DIAGNOSIS — J690 Pneumonitis due to inhalation of food and vomit: Secondary | ICD-10-CM | POA: Diagnosis not present

## 2021-04-27 DIAGNOSIS — J9621 Acute and chronic respiratory failure with hypoxia: Secondary | ICD-10-CM | POA: Diagnosis not present

## 2021-04-27 DIAGNOSIS — N390 Urinary tract infection, site not specified: Secondary | ICD-10-CM | POA: Diagnosis not present

## 2021-04-27 DIAGNOSIS — R1310 Dysphagia, unspecified: Secondary | ICD-10-CM | POA: Diagnosis not present

## 2021-04-29 DIAGNOSIS — I517 Cardiomegaly: Secondary | ICD-10-CM | POA: Diagnosis not present

## 2021-04-29 DIAGNOSIS — F0390 Unspecified dementia without behavioral disturbance: Secondary | ICD-10-CM | POA: Diagnosis not present

## 2021-04-29 DIAGNOSIS — J189 Pneumonia, unspecified organism: Secondary | ICD-10-CM | POA: Diagnosis not present

## 2021-04-29 DIAGNOSIS — J9621 Acute and chronic respiratory failure with hypoxia: Secondary | ICD-10-CM | POA: Diagnosis not present

## 2021-04-29 DIAGNOSIS — N189 Chronic kidney disease, unspecified: Secondary | ICD-10-CM | POA: Diagnosis not present

## 2021-04-29 DIAGNOSIS — R1310 Dysphagia, unspecified: Secondary | ICD-10-CM | POA: Diagnosis not present

## 2021-04-29 DIAGNOSIS — B961 Klebsiella pneumoniae [K. pneumoniae] as the cause of diseases classified elsewhere: Secondary | ICD-10-CM | POA: Diagnosis not present

## 2021-04-29 DIAGNOSIS — L89159 Pressure ulcer of sacral region, unspecified stage: Secondary | ICD-10-CM | POA: Diagnosis not present

## 2021-04-29 DIAGNOSIS — L89619 Pressure ulcer of right heel, unspecified stage: Secondary | ICD-10-CM | POA: Diagnosis not present

## 2021-04-29 DIAGNOSIS — L89629 Pressure ulcer of left heel, unspecified stage: Secondary | ICD-10-CM | POA: Diagnosis not present

## 2021-04-29 DIAGNOSIS — J690 Pneumonitis due to inhalation of food and vomit: Secondary | ICD-10-CM | POA: Diagnosis not present

## 2021-04-29 DIAGNOSIS — N390 Urinary tract infection, site not specified: Secondary | ICD-10-CM | POA: Diagnosis not present

## 2021-05-05 DIAGNOSIS — R1310 Dysphagia, unspecified: Secondary | ICD-10-CM | POA: Diagnosis not present

## 2021-05-05 DIAGNOSIS — J189 Pneumonia, unspecified organism: Secondary | ICD-10-CM | POA: Diagnosis not present

## 2021-05-05 DIAGNOSIS — J9621 Acute and chronic respiratory failure with hypoxia: Secondary | ICD-10-CM | POA: Diagnosis not present

## 2021-05-05 DIAGNOSIS — B961 Klebsiella pneumoniae [K. pneumoniae] as the cause of diseases classified elsewhere: Secondary | ICD-10-CM | POA: Diagnosis not present

## 2021-05-05 DIAGNOSIS — N390 Urinary tract infection, site not specified: Secondary | ICD-10-CM | POA: Diagnosis not present

## 2021-05-05 DIAGNOSIS — J690 Pneumonitis due to inhalation of food and vomit: Secondary | ICD-10-CM | POA: Diagnosis not present

## 2021-05-06 DIAGNOSIS — N390 Urinary tract infection, site not specified: Secondary | ICD-10-CM | POA: Diagnosis not present

## 2021-05-06 DIAGNOSIS — B961 Klebsiella pneumoniae [K. pneumoniae] as the cause of diseases classified elsewhere: Secondary | ICD-10-CM | POA: Diagnosis not present

## 2021-05-06 DIAGNOSIS — R1310 Dysphagia, unspecified: Secondary | ICD-10-CM | POA: Diagnosis not present

## 2021-05-06 DIAGNOSIS — J690 Pneumonitis due to inhalation of food and vomit: Secondary | ICD-10-CM | POA: Diagnosis not present

## 2021-05-06 DIAGNOSIS — J9621 Acute and chronic respiratory failure with hypoxia: Secondary | ICD-10-CM | POA: Diagnosis not present

## 2021-05-06 DIAGNOSIS — J189 Pneumonia, unspecified organism: Secondary | ICD-10-CM | POA: Diagnosis not present

## 2021-05-10 DIAGNOSIS — B961 Klebsiella pneumoniae [K. pneumoniae] as the cause of diseases classified elsewhere: Secondary | ICD-10-CM | POA: Diagnosis not present

## 2021-05-10 DIAGNOSIS — J690 Pneumonitis due to inhalation of food and vomit: Secondary | ICD-10-CM | POA: Diagnosis not present

## 2021-05-10 DIAGNOSIS — J189 Pneumonia, unspecified organism: Secondary | ICD-10-CM | POA: Diagnosis not present

## 2021-05-10 DIAGNOSIS — N390 Urinary tract infection, site not specified: Secondary | ICD-10-CM | POA: Diagnosis not present

## 2021-05-10 DIAGNOSIS — J9621 Acute and chronic respiratory failure with hypoxia: Secondary | ICD-10-CM | POA: Diagnosis not present

## 2021-05-10 DIAGNOSIS — R1310 Dysphagia, unspecified: Secondary | ICD-10-CM | POA: Diagnosis not present

## 2021-05-11 DIAGNOSIS — R1310 Dysphagia, unspecified: Secondary | ICD-10-CM | POA: Diagnosis not present

## 2021-05-11 DIAGNOSIS — J189 Pneumonia, unspecified organism: Secondary | ICD-10-CM | POA: Diagnosis not present

## 2021-05-11 DIAGNOSIS — J9621 Acute and chronic respiratory failure with hypoxia: Secondary | ICD-10-CM | POA: Diagnosis not present

## 2021-05-11 DIAGNOSIS — N390 Urinary tract infection, site not specified: Secondary | ICD-10-CM | POA: Diagnosis not present

## 2021-05-11 DIAGNOSIS — B961 Klebsiella pneumoniae [K. pneumoniae] as the cause of diseases classified elsewhere: Secondary | ICD-10-CM | POA: Diagnosis not present

## 2021-05-11 DIAGNOSIS — J690 Pneumonitis due to inhalation of food and vomit: Secondary | ICD-10-CM | POA: Diagnosis not present

## 2021-05-12 DIAGNOSIS — F0391 Unspecified dementia with behavioral disturbance: Secondary | ICD-10-CM | POA: Diagnosis not present

## 2021-05-12 DIAGNOSIS — F39 Unspecified mood [affective] disorder: Secondary | ICD-10-CM | POA: Diagnosis not present

## 2021-05-12 DIAGNOSIS — F5105 Insomnia due to other mental disorder: Secondary | ICD-10-CM | POA: Diagnosis not present

## 2021-05-12 DIAGNOSIS — J189 Pneumonia, unspecified organism: Secondary | ICD-10-CM | POA: Diagnosis not present

## 2021-05-12 DIAGNOSIS — R1310 Dysphagia, unspecified: Secondary | ICD-10-CM | POA: Diagnosis not present

## 2021-05-12 DIAGNOSIS — J9621 Acute and chronic respiratory failure with hypoxia: Secondary | ICD-10-CM | POA: Diagnosis not present

## 2021-05-12 DIAGNOSIS — N390 Urinary tract infection, site not specified: Secondary | ICD-10-CM | POA: Diagnosis not present

## 2021-05-12 DIAGNOSIS — B961 Klebsiella pneumoniae [K. pneumoniae] as the cause of diseases classified elsewhere: Secondary | ICD-10-CM | POA: Diagnosis not present

## 2021-05-12 DIAGNOSIS — J690 Pneumonitis due to inhalation of food and vomit: Secondary | ICD-10-CM | POA: Diagnosis not present

## 2021-05-17 DIAGNOSIS — N390 Urinary tract infection, site not specified: Secondary | ICD-10-CM | POA: Diagnosis not present

## 2021-05-17 DIAGNOSIS — J189 Pneumonia, unspecified organism: Secondary | ICD-10-CM | POA: Diagnosis not present

## 2021-05-17 DIAGNOSIS — J690 Pneumonitis due to inhalation of food and vomit: Secondary | ICD-10-CM | POA: Diagnosis not present

## 2021-05-17 DIAGNOSIS — B961 Klebsiella pneumoniae [K. pneumoniae] as the cause of diseases classified elsewhere: Secondary | ICD-10-CM | POA: Diagnosis not present

## 2021-05-17 DIAGNOSIS — R1310 Dysphagia, unspecified: Secondary | ICD-10-CM | POA: Diagnosis not present

## 2021-05-17 DIAGNOSIS — J9621 Acute and chronic respiratory failure with hypoxia: Secondary | ICD-10-CM | POA: Diagnosis not present

## 2021-05-18 DIAGNOSIS — J690 Pneumonitis due to inhalation of food and vomit: Secondary | ICD-10-CM | POA: Diagnosis not present

## 2021-05-18 DIAGNOSIS — N390 Urinary tract infection, site not specified: Secondary | ICD-10-CM | POA: Diagnosis not present

## 2021-05-18 DIAGNOSIS — R1310 Dysphagia, unspecified: Secondary | ICD-10-CM | POA: Diagnosis not present

## 2021-05-18 DIAGNOSIS — J9621 Acute and chronic respiratory failure with hypoxia: Secondary | ICD-10-CM | POA: Diagnosis not present

## 2021-05-18 DIAGNOSIS — J189 Pneumonia, unspecified organism: Secondary | ICD-10-CM | POA: Diagnosis not present

## 2021-05-18 DIAGNOSIS — B961 Klebsiella pneumoniae [K. pneumoniae] as the cause of diseases classified elsewhere: Secondary | ICD-10-CM | POA: Diagnosis not present

## 2021-05-20 DIAGNOSIS — B961 Klebsiella pneumoniae [K. pneumoniae] as the cause of diseases classified elsewhere: Secondary | ICD-10-CM | POA: Diagnosis not present

## 2021-05-20 DIAGNOSIS — J189 Pneumonia, unspecified organism: Secondary | ICD-10-CM | POA: Diagnosis not present

## 2021-05-20 DIAGNOSIS — R1310 Dysphagia, unspecified: Secondary | ICD-10-CM | POA: Diagnosis not present

## 2021-05-20 DIAGNOSIS — N390 Urinary tract infection, site not specified: Secondary | ICD-10-CM | POA: Diagnosis not present

## 2021-05-20 DIAGNOSIS — J690 Pneumonitis due to inhalation of food and vomit: Secondary | ICD-10-CM | POA: Diagnosis not present

## 2021-05-20 DIAGNOSIS — J9621 Acute and chronic respiratory failure with hypoxia: Secondary | ICD-10-CM | POA: Diagnosis not present

## 2021-05-25 DIAGNOSIS — B961 Klebsiella pneumoniae [K. pneumoniae] as the cause of diseases classified elsewhere: Secondary | ICD-10-CM | POA: Diagnosis not present

## 2021-05-25 DIAGNOSIS — J9621 Acute and chronic respiratory failure with hypoxia: Secondary | ICD-10-CM | POA: Diagnosis not present

## 2021-05-25 DIAGNOSIS — N390 Urinary tract infection, site not specified: Secondary | ICD-10-CM | POA: Diagnosis not present

## 2021-05-25 DIAGNOSIS — R1310 Dysphagia, unspecified: Secondary | ICD-10-CM | POA: Diagnosis not present

## 2021-05-25 DIAGNOSIS — J189 Pneumonia, unspecified organism: Secondary | ICD-10-CM | POA: Diagnosis not present

## 2021-05-25 DIAGNOSIS — J690 Pneumonitis due to inhalation of food and vomit: Secondary | ICD-10-CM | POA: Diagnosis not present

## 2021-05-27 DIAGNOSIS — J189 Pneumonia, unspecified organism: Secondary | ICD-10-CM | POA: Diagnosis not present

## 2021-05-27 DIAGNOSIS — J690 Pneumonitis due to inhalation of food and vomit: Secondary | ICD-10-CM | POA: Diagnosis not present

## 2021-05-27 DIAGNOSIS — N390 Urinary tract infection, site not specified: Secondary | ICD-10-CM | POA: Diagnosis not present

## 2021-05-27 DIAGNOSIS — J9621 Acute and chronic respiratory failure with hypoxia: Secondary | ICD-10-CM | POA: Diagnosis not present

## 2021-05-27 DIAGNOSIS — R1310 Dysphagia, unspecified: Secondary | ICD-10-CM | POA: Diagnosis not present

## 2021-05-27 DIAGNOSIS — B961 Klebsiella pneumoniae [K. pneumoniae] as the cause of diseases classified elsewhere: Secondary | ICD-10-CM | POA: Diagnosis not present

## 2021-05-29 DIAGNOSIS — B961 Klebsiella pneumoniae [K. pneumoniae] as the cause of diseases classified elsewhere: Secondary | ICD-10-CM | POA: Diagnosis not present

## 2021-05-29 DIAGNOSIS — J9621 Acute and chronic respiratory failure with hypoxia: Secondary | ICD-10-CM | POA: Diagnosis not present

## 2021-05-29 DIAGNOSIS — N189 Chronic kidney disease, unspecified: Secondary | ICD-10-CM | POA: Diagnosis not present

## 2021-05-29 DIAGNOSIS — L89159 Pressure ulcer of sacral region, unspecified stage: Secondary | ICD-10-CM | POA: Diagnosis not present

## 2021-05-29 DIAGNOSIS — J690 Pneumonitis due to inhalation of food and vomit: Secondary | ICD-10-CM | POA: Diagnosis not present

## 2021-05-29 DIAGNOSIS — L89629 Pressure ulcer of left heel, unspecified stage: Secondary | ICD-10-CM | POA: Diagnosis not present

## 2021-05-29 DIAGNOSIS — L89619 Pressure ulcer of right heel, unspecified stage: Secondary | ICD-10-CM | POA: Diagnosis not present

## 2021-05-29 DIAGNOSIS — J189 Pneumonia, unspecified organism: Secondary | ICD-10-CM | POA: Diagnosis not present

## 2021-05-29 DIAGNOSIS — F0390 Unspecified dementia without behavioral disturbance: Secondary | ICD-10-CM | POA: Diagnosis not present

## 2021-05-29 DIAGNOSIS — R1310 Dysphagia, unspecified: Secondary | ICD-10-CM | POA: Diagnosis not present

## 2021-05-29 DIAGNOSIS — N390 Urinary tract infection, site not specified: Secondary | ICD-10-CM | POA: Diagnosis not present

## 2021-05-29 DIAGNOSIS — I517 Cardiomegaly: Secondary | ICD-10-CM | POA: Diagnosis not present

## 2021-06-01 DIAGNOSIS — J690 Pneumonitis due to inhalation of food and vomit: Secondary | ICD-10-CM | POA: Diagnosis not present

## 2021-06-01 DIAGNOSIS — J9621 Acute and chronic respiratory failure with hypoxia: Secondary | ICD-10-CM | POA: Diagnosis not present

## 2021-06-01 DIAGNOSIS — B961 Klebsiella pneumoniae [K. pneumoniae] as the cause of diseases classified elsewhere: Secondary | ICD-10-CM | POA: Diagnosis not present

## 2021-06-01 DIAGNOSIS — J189 Pneumonia, unspecified organism: Secondary | ICD-10-CM | POA: Diagnosis not present

## 2021-06-01 DIAGNOSIS — N390 Urinary tract infection, site not specified: Secondary | ICD-10-CM | POA: Diagnosis not present

## 2021-06-01 DIAGNOSIS — R1310 Dysphagia, unspecified: Secondary | ICD-10-CM | POA: Diagnosis not present

## 2021-06-02 DIAGNOSIS — N390 Urinary tract infection, site not specified: Secondary | ICD-10-CM | POA: Diagnosis not present

## 2021-06-02 DIAGNOSIS — R1310 Dysphagia, unspecified: Secondary | ICD-10-CM | POA: Diagnosis not present

## 2021-06-02 DIAGNOSIS — J189 Pneumonia, unspecified organism: Secondary | ICD-10-CM | POA: Diagnosis not present

## 2021-06-02 DIAGNOSIS — B961 Klebsiella pneumoniae [K. pneumoniae] as the cause of diseases classified elsewhere: Secondary | ICD-10-CM | POA: Diagnosis not present

## 2021-06-02 DIAGNOSIS — J690 Pneumonitis due to inhalation of food and vomit: Secondary | ICD-10-CM | POA: Diagnosis not present

## 2021-06-02 DIAGNOSIS — J9621 Acute and chronic respiratory failure with hypoxia: Secondary | ICD-10-CM | POA: Diagnosis not present

## 2021-06-03 DIAGNOSIS — R1310 Dysphagia, unspecified: Secondary | ICD-10-CM | POA: Diagnosis not present

## 2021-06-03 DIAGNOSIS — J189 Pneumonia, unspecified organism: Secondary | ICD-10-CM | POA: Diagnosis not present

## 2021-06-03 DIAGNOSIS — J690 Pneumonitis due to inhalation of food and vomit: Secondary | ICD-10-CM | POA: Diagnosis not present

## 2021-06-03 DIAGNOSIS — N390 Urinary tract infection, site not specified: Secondary | ICD-10-CM | POA: Diagnosis not present

## 2021-06-03 DIAGNOSIS — B961 Klebsiella pneumoniae [K. pneumoniae] as the cause of diseases classified elsewhere: Secondary | ICD-10-CM | POA: Diagnosis not present

## 2021-06-03 DIAGNOSIS — J9621 Acute and chronic respiratory failure with hypoxia: Secondary | ICD-10-CM | POA: Diagnosis not present

## 2021-06-04 DIAGNOSIS — B961 Klebsiella pneumoniae [K. pneumoniae] as the cause of diseases classified elsewhere: Secondary | ICD-10-CM | POA: Diagnosis not present

## 2021-06-04 DIAGNOSIS — N390 Urinary tract infection, site not specified: Secondary | ICD-10-CM | POA: Diagnosis not present

## 2021-06-04 DIAGNOSIS — J9621 Acute and chronic respiratory failure with hypoxia: Secondary | ICD-10-CM | POA: Diagnosis not present

## 2021-06-04 DIAGNOSIS — J690 Pneumonitis due to inhalation of food and vomit: Secondary | ICD-10-CM | POA: Diagnosis not present

## 2021-06-04 DIAGNOSIS — J189 Pneumonia, unspecified organism: Secondary | ICD-10-CM | POA: Diagnosis not present

## 2021-06-04 DIAGNOSIS — R1310 Dysphagia, unspecified: Secondary | ICD-10-CM | POA: Diagnosis not present

## 2021-06-07 DIAGNOSIS — L120 Bullous pemphigoid: Secondary | ICD-10-CM | POA: Diagnosis not present

## 2021-06-08 DIAGNOSIS — R1310 Dysphagia, unspecified: Secondary | ICD-10-CM | POA: Diagnosis not present

## 2021-06-08 DIAGNOSIS — N390 Urinary tract infection, site not specified: Secondary | ICD-10-CM | POA: Diagnosis not present

## 2021-06-08 DIAGNOSIS — B961 Klebsiella pneumoniae [K. pneumoniae] as the cause of diseases classified elsewhere: Secondary | ICD-10-CM | POA: Diagnosis not present

## 2021-06-08 DIAGNOSIS — J9621 Acute and chronic respiratory failure with hypoxia: Secondary | ICD-10-CM | POA: Diagnosis not present

## 2021-06-08 DIAGNOSIS — J690 Pneumonitis due to inhalation of food and vomit: Secondary | ICD-10-CM | POA: Diagnosis not present

## 2021-06-08 DIAGNOSIS — J189 Pneumonia, unspecified organism: Secondary | ICD-10-CM | POA: Diagnosis not present

## 2021-06-09 DIAGNOSIS — J9621 Acute and chronic respiratory failure with hypoxia: Secondary | ICD-10-CM | POA: Diagnosis not present

## 2021-06-09 DIAGNOSIS — J690 Pneumonitis due to inhalation of food and vomit: Secondary | ICD-10-CM | POA: Diagnosis not present

## 2021-06-09 DIAGNOSIS — R1310 Dysphagia, unspecified: Secondary | ICD-10-CM | POA: Diagnosis not present

## 2021-06-09 DIAGNOSIS — N390 Urinary tract infection, site not specified: Secondary | ICD-10-CM | POA: Diagnosis not present

## 2021-06-09 DIAGNOSIS — J189 Pneumonia, unspecified organism: Secondary | ICD-10-CM | POA: Diagnosis not present

## 2021-06-09 DIAGNOSIS — B961 Klebsiella pneumoniae [K. pneumoniae] as the cause of diseases classified elsewhere: Secondary | ICD-10-CM | POA: Diagnosis not present

## 2021-06-10 DIAGNOSIS — J9621 Acute and chronic respiratory failure with hypoxia: Secondary | ICD-10-CM | POA: Diagnosis not present

## 2021-06-10 DIAGNOSIS — B961 Klebsiella pneumoniae [K. pneumoniae] as the cause of diseases classified elsewhere: Secondary | ICD-10-CM | POA: Diagnosis not present

## 2021-06-10 DIAGNOSIS — J189 Pneumonia, unspecified organism: Secondary | ICD-10-CM | POA: Diagnosis not present

## 2021-06-10 DIAGNOSIS — R1310 Dysphagia, unspecified: Secondary | ICD-10-CM | POA: Diagnosis not present

## 2021-06-10 DIAGNOSIS — N390 Urinary tract infection, site not specified: Secondary | ICD-10-CM | POA: Diagnosis not present

## 2021-06-10 DIAGNOSIS — J690 Pneumonitis due to inhalation of food and vomit: Secondary | ICD-10-CM | POA: Diagnosis not present

## 2021-06-15 DIAGNOSIS — J690 Pneumonitis due to inhalation of food and vomit: Secondary | ICD-10-CM | POA: Diagnosis not present

## 2021-06-15 DIAGNOSIS — J189 Pneumonia, unspecified organism: Secondary | ICD-10-CM | POA: Diagnosis not present

## 2021-06-15 DIAGNOSIS — N390 Urinary tract infection, site not specified: Secondary | ICD-10-CM | POA: Diagnosis not present

## 2021-06-15 DIAGNOSIS — J9621 Acute and chronic respiratory failure with hypoxia: Secondary | ICD-10-CM | POA: Diagnosis not present

## 2021-06-15 DIAGNOSIS — B961 Klebsiella pneumoniae [K. pneumoniae] as the cause of diseases classified elsewhere: Secondary | ICD-10-CM | POA: Diagnosis not present

## 2021-06-15 DIAGNOSIS — R1310 Dysphagia, unspecified: Secondary | ICD-10-CM | POA: Diagnosis not present

## 2021-06-17 DIAGNOSIS — J189 Pneumonia, unspecified organism: Secondary | ICD-10-CM | POA: Diagnosis not present

## 2021-06-17 DIAGNOSIS — B961 Klebsiella pneumoniae [K. pneumoniae] as the cause of diseases classified elsewhere: Secondary | ICD-10-CM | POA: Diagnosis not present

## 2021-06-17 DIAGNOSIS — N390 Urinary tract infection, site not specified: Secondary | ICD-10-CM | POA: Diagnosis not present

## 2021-06-17 DIAGNOSIS — J9621 Acute and chronic respiratory failure with hypoxia: Secondary | ICD-10-CM | POA: Diagnosis not present

## 2021-06-17 DIAGNOSIS — R1310 Dysphagia, unspecified: Secondary | ICD-10-CM | POA: Diagnosis not present

## 2021-06-17 DIAGNOSIS — J690 Pneumonitis due to inhalation of food and vomit: Secondary | ICD-10-CM | POA: Diagnosis not present

## 2021-06-18 DIAGNOSIS — R1310 Dysphagia, unspecified: Secondary | ICD-10-CM | POA: Diagnosis not present

## 2021-06-18 DIAGNOSIS — J9621 Acute and chronic respiratory failure with hypoxia: Secondary | ICD-10-CM | POA: Diagnosis not present

## 2021-06-18 DIAGNOSIS — J189 Pneumonia, unspecified organism: Secondary | ICD-10-CM | POA: Diagnosis not present

## 2021-06-18 DIAGNOSIS — J690 Pneumonitis due to inhalation of food and vomit: Secondary | ICD-10-CM | POA: Diagnosis not present

## 2021-06-18 DIAGNOSIS — N390 Urinary tract infection, site not specified: Secondary | ICD-10-CM | POA: Diagnosis not present

## 2021-06-18 DIAGNOSIS — B961 Klebsiella pneumoniae [K. pneumoniae] as the cause of diseases classified elsewhere: Secondary | ICD-10-CM | POA: Diagnosis not present

## 2021-06-22 DIAGNOSIS — J690 Pneumonitis due to inhalation of food and vomit: Secondary | ICD-10-CM | POA: Diagnosis not present

## 2021-06-22 DIAGNOSIS — N390 Urinary tract infection, site not specified: Secondary | ICD-10-CM | POA: Diagnosis not present

## 2021-06-22 DIAGNOSIS — J189 Pneumonia, unspecified organism: Secondary | ICD-10-CM | POA: Diagnosis not present

## 2021-06-22 DIAGNOSIS — J9621 Acute and chronic respiratory failure with hypoxia: Secondary | ICD-10-CM | POA: Diagnosis not present

## 2021-06-22 DIAGNOSIS — B961 Klebsiella pneumoniae [K. pneumoniae] as the cause of diseases classified elsewhere: Secondary | ICD-10-CM | POA: Diagnosis not present

## 2021-06-22 DIAGNOSIS — R1310 Dysphagia, unspecified: Secondary | ICD-10-CM | POA: Diagnosis not present

## 2021-06-23 DIAGNOSIS — J9621 Acute and chronic respiratory failure with hypoxia: Secondary | ICD-10-CM | POA: Diagnosis not present

## 2021-06-23 DIAGNOSIS — J690 Pneumonitis due to inhalation of food and vomit: Secondary | ICD-10-CM | POA: Diagnosis not present

## 2021-06-23 DIAGNOSIS — B961 Klebsiella pneumoniae [K. pneumoniae] as the cause of diseases classified elsewhere: Secondary | ICD-10-CM | POA: Diagnosis not present

## 2021-06-23 DIAGNOSIS — N390 Urinary tract infection, site not specified: Secondary | ICD-10-CM | POA: Diagnosis not present

## 2021-06-23 DIAGNOSIS — J189 Pneumonia, unspecified organism: Secondary | ICD-10-CM | POA: Diagnosis not present

## 2021-06-23 DIAGNOSIS — R1310 Dysphagia, unspecified: Secondary | ICD-10-CM | POA: Diagnosis not present

## 2021-06-24 DIAGNOSIS — J9621 Acute and chronic respiratory failure with hypoxia: Secondary | ICD-10-CM | POA: Diagnosis not present

## 2021-06-24 DIAGNOSIS — R1310 Dysphagia, unspecified: Secondary | ICD-10-CM | POA: Diagnosis not present

## 2021-06-24 DIAGNOSIS — B961 Klebsiella pneumoniae [K. pneumoniae] as the cause of diseases classified elsewhere: Secondary | ICD-10-CM | POA: Diagnosis not present

## 2021-06-24 DIAGNOSIS — J690 Pneumonitis due to inhalation of food and vomit: Secondary | ICD-10-CM | POA: Diagnosis not present

## 2021-06-24 DIAGNOSIS — N390 Urinary tract infection, site not specified: Secondary | ICD-10-CM | POA: Diagnosis not present

## 2021-06-24 DIAGNOSIS — J189 Pneumonia, unspecified organism: Secondary | ICD-10-CM | POA: Diagnosis not present

## 2021-06-29 DIAGNOSIS — L89619 Pressure ulcer of right heel, unspecified stage: Secondary | ICD-10-CM | POA: Diagnosis not present

## 2021-06-29 DIAGNOSIS — L89159 Pressure ulcer of sacral region, unspecified stage: Secondary | ICD-10-CM | POA: Diagnosis not present

## 2021-06-29 DIAGNOSIS — J9621 Acute and chronic respiratory failure with hypoxia: Secondary | ICD-10-CM | POA: Diagnosis not present

## 2021-06-29 DIAGNOSIS — N189 Chronic kidney disease, unspecified: Secondary | ICD-10-CM | POA: Diagnosis not present

## 2021-06-29 DIAGNOSIS — J189 Pneumonia, unspecified organism: Secondary | ICD-10-CM | POA: Diagnosis not present

## 2021-06-29 DIAGNOSIS — F0390 Unspecified dementia without behavioral disturbance: Secondary | ICD-10-CM | POA: Diagnosis not present

## 2021-06-29 DIAGNOSIS — B961 Klebsiella pneumoniae [K. pneumoniae] as the cause of diseases classified elsewhere: Secondary | ICD-10-CM | POA: Diagnosis not present

## 2021-06-29 DIAGNOSIS — R1310 Dysphagia, unspecified: Secondary | ICD-10-CM | POA: Diagnosis not present

## 2021-06-29 DIAGNOSIS — I517 Cardiomegaly: Secondary | ICD-10-CM | POA: Diagnosis not present

## 2021-06-29 DIAGNOSIS — J690 Pneumonitis due to inhalation of food and vomit: Secondary | ICD-10-CM | POA: Diagnosis not present

## 2021-06-29 DIAGNOSIS — N390 Urinary tract infection, site not specified: Secondary | ICD-10-CM | POA: Diagnosis not present

## 2021-06-29 DIAGNOSIS — L89629 Pressure ulcer of left heel, unspecified stage: Secondary | ICD-10-CM | POA: Diagnosis not present

## 2021-07-01 DIAGNOSIS — J189 Pneumonia, unspecified organism: Secondary | ICD-10-CM | POA: Diagnosis not present

## 2021-07-01 DIAGNOSIS — R1310 Dysphagia, unspecified: Secondary | ICD-10-CM | POA: Diagnosis not present

## 2021-07-01 DIAGNOSIS — J690 Pneumonitis due to inhalation of food and vomit: Secondary | ICD-10-CM | POA: Diagnosis not present

## 2021-07-01 DIAGNOSIS — N390 Urinary tract infection, site not specified: Secondary | ICD-10-CM | POA: Diagnosis not present

## 2021-07-01 DIAGNOSIS — J9621 Acute and chronic respiratory failure with hypoxia: Secondary | ICD-10-CM | POA: Diagnosis not present

## 2021-07-01 DIAGNOSIS — B961 Klebsiella pneumoniae [K. pneumoniae] as the cause of diseases classified elsewhere: Secondary | ICD-10-CM | POA: Diagnosis not present

## 2021-07-03 DIAGNOSIS — J9621 Acute and chronic respiratory failure with hypoxia: Secondary | ICD-10-CM | POA: Diagnosis not present

## 2021-07-03 DIAGNOSIS — R1310 Dysphagia, unspecified: Secondary | ICD-10-CM | POA: Diagnosis not present

## 2021-07-03 DIAGNOSIS — N390 Urinary tract infection, site not specified: Secondary | ICD-10-CM | POA: Diagnosis not present

## 2021-07-03 DIAGNOSIS — B961 Klebsiella pneumoniae [K. pneumoniae] as the cause of diseases classified elsewhere: Secondary | ICD-10-CM | POA: Diagnosis not present

## 2021-07-03 DIAGNOSIS — J690 Pneumonitis due to inhalation of food and vomit: Secondary | ICD-10-CM | POA: Diagnosis not present

## 2021-07-03 DIAGNOSIS — J189 Pneumonia, unspecified organism: Secondary | ICD-10-CM | POA: Diagnosis not present

## 2021-07-04 DIAGNOSIS — J9621 Acute and chronic respiratory failure with hypoxia: Secondary | ICD-10-CM | POA: Diagnosis not present

## 2021-07-04 DIAGNOSIS — R918 Other nonspecific abnormal finding of lung field: Secondary | ICD-10-CM | POA: Diagnosis not present

## 2021-07-04 DIAGNOSIS — R404 Transient alteration of awareness: Secondary | ICD-10-CM | POA: Diagnosis not present

## 2021-07-04 DIAGNOSIS — G9341 Metabolic encephalopathy: Secondary | ICD-10-CM | POA: Diagnosis not present

## 2021-07-04 DIAGNOSIS — J690 Pneumonitis due to inhalation of food and vomit: Secondary | ICD-10-CM | POA: Diagnosis not present

## 2021-07-05 DIAGNOSIS — G9341 Metabolic encephalopathy: Secondary | ICD-10-CM | POA: Diagnosis not present

## 2021-07-05 DIAGNOSIS — J9621 Acute and chronic respiratory failure with hypoxia: Secondary | ICD-10-CM | POA: Diagnosis not present

## 2021-07-05 DIAGNOSIS — B961 Klebsiella pneumoniae [K. pneumoniae] as the cause of diseases classified elsewhere: Secondary | ICD-10-CM | POA: Diagnosis not present

## 2021-07-05 DIAGNOSIS — J690 Pneumonitis due to inhalation of food and vomit: Secondary | ICD-10-CM | POA: Diagnosis not present

## 2021-07-05 DIAGNOSIS — N390 Urinary tract infection, site not specified: Secondary | ICD-10-CM | POA: Diagnosis not present

## 2021-07-05 DIAGNOSIS — J189 Pneumonia, unspecified organism: Secondary | ICD-10-CM | POA: Diagnosis not present

## 2021-07-05 DIAGNOSIS — R1310 Dysphagia, unspecified: Secondary | ICD-10-CM | POA: Diagnosis not present

## 2021-07-08 DIAGNOSIS — R1310 Dysphagia, unspecified: Secondary | ICD-10-CM | POA: Diagnosis not present

## 2021-07-08 DIAGNOSIS — N390 Urinary tract infection, site not specified: Secondary | ICD-10-CM | POA: Diagnosis not present

## 2021-07-08 DIAGNOSIS — J9621 Acute and chronic respiratory failure with hypoxia: Secondary | ICD-10-CM | POA: Diagnosis not present

## 2021-07-08 DIAGNOSIS — J189 Pneumonia, unspecified organism: Secondary | ICD-10-CM | POA: Diagnosis not present

## 2021-07-08 DIAGNOSIS — B961 Klebsiella pneumoniae [K. pneumoniae] as the cause of diseases classified elsewhere: Secondary | ICD-10-CM | POA: Diagnosis not present

## 2021-07-08 DIAGNOSIS — J690 Pneumonitis due to inhalation of food and vomit: Secondary | ICD-10-CM | POA: Diagnosis not present

## 2021-07-13 DIAGNOSIS — R1310 Dysphagia, unspecified: Secondary | ICD-10-CM | POA: Diagnosis not present

## 2021-07-13 DIAGNOSIS — B961 Klebsiella pneumoniae [K. pneumoniae] as the cause of diseases classified elsewhere: Secondary | ICD-10-CM | POA: Diagnosis not present

## 2021-07-13 DIAGNOSIS — J9621 Acute and chronic respiratory failure with hypoxia: Secondary | ICD-10-CM | POA: Diagnosis not present

## 2021-07-13 DIAGNOSIS — J690 Pneumonitis due to inhalation of food and vomit: Secondary | ICD-10-CM | POA: Diagnosis not present

## 2021-07-13 DIAGNOSIS — N390 Urinary tract infection, site not specified: Secondary | ICD-10-CM | POA: Diagnosis not present

## 2021-07-13 DIAGNOSIS — J189 Pneumonia, unspecified organism: Secondary | ICD-10-CM | POA: Diagnosis not present

## 2021-07-15 DIAGNOSIS — N390 Urinary tract infection, site not specified: Secondary | ICD-10-CM | POA: Diagnosis not present

## 2021-07-15 DIAGNOSIS — J690 Pneumonitis due to inhalation of food and vomit: Secondary | ICD-10-CM | POA: Diagnosis not present

## 2021-07-15 DIAGNOSIS — J189 Pneumonia, unspecified organism: Secondary | ICD-10-CM | POA: Diagnosis not present

## 2021-07-15 DIAGNOSIS — J9621 Acute and chronic respiratory failure with hypoxia: Secondary | ICD-10-CM | POA: Diagnosis not present

## 2021-07-15 DIAGNOSIS — B961 Klebsiella pneumoniae [K. pneumoniae] as the cause of diseases classified elsewhere: Secondary | ICD-10-CM | POA: Diagnosis not present

## 2021-07-15 DIAGNOSIS — R1310 Dysphagia, unspecified: Secondary | ICD-10-CM | POA: Diagnosis not present

## 2021-07-16 DIAGNOSIS — B961 Klebsiella pneumoniae [K. pneumoniae] as the cause of diseases classified elsewhere: Secondary | ICD-10-CM | POA: Diagnosis not present

## 2021-07-16 DIAGNOSIS — R1310 Dysphagia, unspecified: Secondary | ICD-10-CM | POA: Diagnosis not present

## 2021-07-16 DIAGNOSIS — J189 Pneumonia, unspecified organism: Secondary | ICD-10-CM | POA: Diagnosis not present

## 2021-07-16 DIAGNOSIS — J9621 Acute and chronic respiratory failure with hypoxia: Secondary | ICD-10-CM | POA: Diagnosis not present

## 2021-07-16 DIAGNOSIS — N390 Urinary tract infection, site not specified: Secondary | ICD-10-CM | POA: Diagnosis not present

## 2021-07-16 DIAGNOSIS — J690 Pneumonitis due to inhalation of food and vomit: Secondary | ICD-10-CM | POA: Diagnosis not present

## 2021-07-17 DIAGNOSIS — N390 Urinary tract infection, site not specified: Secondary | ICD-10-CM | POA: Diagnosis not present

## 2021-07-17 DIAGNOSIS — J690 Pneumonitis due to inhalation of food and vomit: Secondary | ICD-10-CM | POA: Diagnosis not present

## 2021-07-17 DIAGNOSIS — B961 Klebsiella pneumoniae [K. pneumoniae] as the cause of diseases classified elsewhere: Secondary | ICD-10-CM | POA: Diagnosis not present

## 2021-07-17 DIAGNOSIS — J9621 Acute and chronic respiratory failure with hypoxia: Secondary | ICD-10-CM | POA: Diagnosis not present

## 2021-07-17 DIAGNOSIS — R1310 Dysphagia, unspecified: Secondary | ICD-10-CM | POA: Diagnosis not present

## 2021-07-17 DIAGNOSIS — J189 Pneumonia, unspecified organism: Secondary | ICD-10-CM | POA: Diagnosis not present

## 2021-07-18 DIAGNOSIS — B961 Klebsiella pneumoniae [K. pneumoniae] as the cause of diseases classified elsewhere: Secondary | ICD-10-CM | POA: Diagnosis not present

## 2021-07-18 DIAGNOSIS — N390 Urinary tract infection, site not specified: Secondary | ICD-10-CM | POA: Diagnosis not present

## 2021-07-18 DIAGNOSIS — R1310 Dysphagia, unspecified: Secondary | ICD-10-CM | POA: Diagnosis not present

## 2021-07-18 DIAGNOSIS — J189 Pneumonia, unspecified organism: Secondary | ICD-10-CM | POA: Diagnosis not present

## 2021-07-18 DIAGNOSIS — J690 Pneumonitis due to inhalation of food and vomit: Secondary | ICD-10-CM | POA: Diagnosis not present

## 2021-07-18 DIAGNOSIS — J9621 Acute and chronic respiratory failure with hypoxia: Secondary | ICD-10-CM | POA: Diagnosis not present

## 2021-07-19 DIAGNOSIS — R1310 Dysphagia, unspecified: Secondary | ICD-10-CM | POA: Diagnosis not present

## 2021-07-19 DIAGNOSIS — N390 Urinary tract infection, site not specified: Secondary | ICD-10-CM | POA: Diagnosis not present

## 2021-07-19 DIAGNOSIS — J189 Pneumonia, unspecified organism: Secondary | ICD-10-CM | POA: Diagnosis not present

## 2021-07-19 DIAGNOSIS — J690 Pneumonitis due to inhalation of food and vomit: Secondary | ICD-10-CM | POA: Diagnosis not present

## 2021-07-19 DIAGNOSIS — B961 Klebsiella pneumoniae [K. pneumoniae] as the cause of diseases classified elsewhere: Secondary | ICD-10-CM | POA: Diagnosis not present

## 2021-07-19 DIAGNOSIS — J9621 Acute and chronic respiratory failure with hypoxia: Secondary | ICD-10-CM | POA: Diagnosis not present

## 2021-07-20 DIAGNOSIS — B961 Klebsiella pneumoniae [K. pneumoniae] as the cause of diseases classified elsewhere: Secondary | ICD-10-CM | POA: Diagnosis not present

## 2021-07-20 DIAGNOSIS — R1310 Dysphagia, unspecified: Secondary | ICD-10-CM | POA: Diagnosis not present

## 2021-07-20 DIAGNOSIS — J9621 Acute and chronic respiratory failure with hypoxia: Secondary | ICD-10-CM | POA: Diagnosis not present

## 2021-07-20 DIAGNOSIS — J189 Pneumonia, unspecified organism: Secondary | ICD-10-CM | POA: Diagnosis not present

## 2021-07-20 DIAGNOSIS — J690 Pneumonitis due to inhalation of food and vomit: Secondary | ICD-10-CM | POA: Diagnosis not present

## 2021-07-20 DIAGNOSIS — N390 Urinary tract infection, site not specified: Secondary | ICD-10-CM | POA: Diagnosis not present

## 2021-07-21 DIAGNOSIS — N390 Urinary tract infection, site not specified: Secondary | ICD-10-CM | POA: Diagnosis not present

## 2021-07-21 DIAGNOSIS — J189 Pneumonia, unspecified organism: Secondary | ICD-10-CM | POA: Diagnosis not present

## 2021-07-21 DIAGNOSIS — J690 Pneumonitis due to inhalation of food and vomit: Secondary | ICD-10-CM | POA: Diagnosis not present

## 2021-07-21 DIAGNOSIS — B961 Klebsiella pneumoniae [K. pneumoniae] as the cause of diseases classified elsewhere: Secondary | ICD-10-CM | POA: Diagnosis not present

## 2021-07-21 DIAGNOSIS — R1310 Dysphagia, unspecified: Secondary | ICD-10-CM | POA: Diagnosis not present

## 2021-07-21 DIAGNOSIS — J9621 Acute and chronic respiratory failure with hypoxia: Secondary | ICD-10-CM | POA: Diagnosis not present

## 2021-07-27 DIAGNOSIS — N390 Urinary tract infection, site not specified: Secondary | ICD-10-CM | POA: Diagnosis not present

## 2021-07-27 DIAGNOSIS — J189 Pneumonia, unspecified organism: Secondary | ICD-10-CM | POA: Diagnosis not present

## 2021-07-27 DIAGNOSIS — J690 Pneumonitis due to inhalation of food and vomit: Secondary | ICD-10-CM | POA: Diagnosis not present

## 2021-07-27 DIAGNOSIS — B961 Klebsiella pneumoniae [K. pneumoniae] as the cause of diseases classified elsewhere: Secondary | ICD-10-CM | POA: Diagnosis not present

## 2021-07-27 DIAGNOSIS — R1310 Dysphagia, unspecified: Secondary | ICD-10-CM | POA: Diagnosis not present

## 2021-07-27 DIAGNOSIS — J9621 Acute and chronic respiratory failure with hypoxia: Secondary | ICD-10-CM | POA: Diagnosis not present

## 2021-07-28 DIAGNOSIS — J189 Pneumonia, unspecified organism: Secondary | ICD-10-CM | POA: Diagnosis not present

## 2021-07-28 DIAGNOSIS — B961 Klebsiella pneumoniae [K. pneumoniae] as the cause of diseases classified elsewhere: Secondary | ICD-10-CM | POA: Diagnosis not present

## 2021-07-28 DIAGNOSIS — N390 Urinary tract infection, site not specified: Secondary | ICD-10-CM | POA: Diagnosis not present

## 2021-07-28 DIAGNOSIS — J9621 Acute and chronic respiratory failure with hypoxia: Secondary | ICD-10-CM | POA: Diagnosis not present

## 2021-07-28 DIAGNOSIS — R1310 Dysphagia, unspecified: Secondary | ICD-10-CM | POA: Diagnosis not present

## 2021-07-28 DIAGNOSIS — J690 Pneumonitis due to inhalation of food and vomit: Secondary | ICD-10-CM | POA: Diagnosis not present

## 2021-07-29 DIAGNOSIS — N390 Urinary tract infection, site not specified: Secondary | ICD-10-CM | POA: Diagnosis not present

## 2021-07-29 DIAGNOSIS — I517 Cardiomegaly: Secondary | ICD-10-CM | POA: Diagnosis not present

## 2021-07-29 DIAGNOSIS — J189 Pneumonia, unspecified organism: Secondary | ICD-10-CM | POA: Diagnosis not present

## 2021-07-29 DIAGNOSIS — B961 Klebsiella pneumoniae [K. pneumoniae] as the cause of diseases classified elsewhere: Secondary | ICD-10-CM | POA: Diagnosis not present

## 2021-07-29 DIAGNOSIS — F0390 Unspecified dementia without behavioral disturbance: Secondary | ICD-10-CM | POA: Diagnosis not present

## 2021-07-29 DIAGNOSIS — L89159 Pressure ulcer of sacral region, unspecified stage: Secondary | ICD-10-CM | POA: Diagnosis not present

## 2021-07-29 DIAGNOSIS — N189 Chronic kidney disease, unspecified: Secondary | ICD-10-CM | POA: Diagnosis not present

## 2021-07-29 DIAGNOSIS — L89629 Pressure ulcer of left heel, unspecified stage: Secondary | ICD-10-CM | POA: Diagnosis not present

## 2021-07-29 DIAGNOSIS — R1310 Dysphagia, unspecified: Secondary | ICD-10-CM | POA: Diagnosis not present

## 2021-07-29 DIAGNOSIS — J9621 Acute and chronic respiratory failure with hypoxia: Secondary | ICD-10-CM | POA: Diagnosis not present

## 2021-07-29 DIAGNOSIS — J690 Pneumonitis due to inhalation of food and vomit: Secondary | ICD-10-CM | POA: Diagnosis not present

## 2021-07-29 DIAGNOSIS — L89619 Pressure ulcer of right heel, unspecified stage: Secondary | ICD-10-CM | POA: Diagnosis not present

## 2021-08-02 DIAGNOSIS — J189 Pneumonia, unspecified organism: Secondary | ICD-10-CM | POA: Diagnosis not present

## 2021-08-02 DIAGNOSIS — J690 Pneumonitis due to inhalation of food and vomit: Secondary | ICD-10-CM | POA: Diagnosis not present

## 2021-08-02 DIAGNOSIS — R1310 Dysphagia, unspecified: Secondary | ICD-10-CM | POA: Diagnosis not present

## 2021-08-02 DIAGNOSIS — N390 Urinary tract infection, site not specified: Secondary | ICD-10-CM | POA: Diagnosis not present

## 2021-08-02 DIAGNOSIS — B961 Klebsiella pneumoniae [K. pneumoniae] as the cause of diseases classified elsewhere: Secondary | ICD-10-CM | POA: Diagnosis not present

## 2021-08-02 DIAGNOSIS — J9621 Acute and chronic respiratory failure with hypoxia: Secondary | ICD-10-CM | POA: Diagnosis not present

## 2021-08-03 DIAGNOSIS — R1310 Dysphagia, unspecified: Secondary | ICD-10-CM | POA: Diagnosis not present

## 2021-08-03 DIAGNOSIS — J9621 Acute and chronic respiratory failure with hypoxia: Secondary | ICD-10-CM | POA: Diagnosis not present

## 2021-08-03 DIAGNOSIS — B961 Klebsiella pneumoniae [K. pneumoniae] as the cause of diseases classified elsewhere: Secondary | ICD-10-CM | POA: Diagnosis not present

## 2021-08-03 DIAGNOSIS — N390 Urinary tract infection, site not specified: Secondary | ICD-10-CM | POA: Diagnosis not present

## 2021-08-03 DIAGNOSIS — J189 Pneumonia, unspecified organism: Secondary | ICD-10-CM | POA: Diagnosis not present

## 2021-08-03 DIAGNOSIS — J690 Pneumonitis due to inhalation of food and vomit: Secondary | ICD-10-CM | POA: Diagnosis not present

## 2021-08-05 DIAGNOSIS — J9621 Acute and chronic respiratory failure with hypoxia: Secondary | ICD-10-CM | POA: Diagnosis not present

## 2021-08-05 DIAGNOSIS — B961 Klebsiella pneumoniae [K. pneumoniae] as the cause of diseases classified elsewhere: Secondary | ICD-10-CM | POA: Diagnosis not present

## 2021-08-05 DIAGNOSIS — J690 Pneumonitis due to inhalation of food and vomit: Secondary | ICD-10-CM | POA: Diagnosis not present

## 2021-08-05 DIAGNOSIS — N390 Urinary tract infection, site not specified: Secondary | ICD-10-CM | POA: Diagnosis not present

## 2021-08-05 DIAGNOSIS — R1310 Dysphagia, unspecified: Secondary | ICD-10-CM | POA: Diagnosis not present

## 2021-08-05 DIAGNOSIS — J189 Pneumonia, unspecified organism: Secondary | ICD-10-CM | POA: Diagnosis not present

## 2021-08-10 DIAGNOSIS — J9621 Acute and chronic respiratory failure with hypoxia: Secondary | ICD-10-CM | POA: Diagnosis not present

## 2021-08-10 DIAGNOSIS — B961 Klebsiella pneumoniae [K. pneumoniae] as the cause of diseases classified elsewhere: Secondary | ICD-10-CM | POA: Diagnosis not present

## 2021-08-10 DIAGNOSIS — N390 Urinary tract infection, site not specified: Secondary | ICD-10-CM | POA: Diagnosis not present

## 2021-08-10 DIAGNOSIS — R1310 Dysphagia, unspecified: Secondary | ICD-10-CM | POA: Diagnosis not present

## 2021-08-10 DIAGNOSIS — J189 Pneumonia, unspecified organism: Secondary | ICD-10-CM | POA: Diagnosis not present

## 2021-08-10 DIAGNOSIS — J690 Pneumonitis due to inhalation of food and vomit: Secondary | ICD-10-CM | POA: Diagnosis not present

## 2021-08-11 DIAGNOSIS — J189 Pneumonia, unspecified organism: Secondary | ICD-10-CM | POA: Diagnosis not present

## 2021-08-11 DIAGNOSIS — R1310 Dysphagia, unspecified: Secondary | ICD-10-CM | POA: Diagnosis not present

## 2021-08-11 DIAGNOSIS — B961 Klebsiella pneumoniae [K. pneumoniae] as the cause of diseases classified elsewhere: Secondary | ICD-10-CM | POA: Diagnosis not present

## 2021-08-11 DIAGNOSIS — J690 Pneumonitis due to inhalation of food and vomit: Secondary | ICD-10-CM | POA: Diagnosis not present

## 2021-08-11 DIAGNOSIS — N390 Urinary tract infection, site not specified: Secondary | ICD-10-CM | POA: Diagnosis not present

## 2021-08-11 DIAGNOSIS — J9621 Acute and chronic respiratory failure with hypoxia: Secondary | ICD-10-CM | POA: Diagnosis not present

## 2021-08-12 DIAGNOSIS — J690 Pneumonitis due to inhalation of food and vomit: Secondary | ICD-10-CM | POA: Diagnosis not present

## 2021-08-12 DIAGNOSIS — N390 Urinary tract infection, site not specified: Secondary | ICD-10-CM | POA: Diagnosis not present

## 2021-08-12 DIAGNOSIS — J189 Pneumonia, unspecified organism: Secondary | ICD-10-CM | POA: Diagnosis not present

## 2021-08-12 DIAGNOSIS — B961 Klebsiella pneumoniae [K. pneumoniae] as the cause of diseases classified elsewhere: Secondary | ICD-10-CM | POA: Diagnosis not present

## 2021-08-12 DIAGNOSIS — J9621 Acute and chronic respiratory failure with hypoxia: Secondary | ICD-10-CM | POA: Diagnosis not present

## 2021-08-12 DIAGNOSIS — R1310 Dysphagia, unspecified: Secondary | ICD-10-CM | POA: Diagnosis not present

## 2021-08-17 DIAGNOSIS — B961 Klebsiella pneumoniae [K. pneumoniae] as the cause of diseases classified elsewhere: Secondary | ICD-10-CM | POA: Diagnosis not present

## 2021-08-17 DIAGNOSIS — R1310 Dysphagia, unspecified: Secondary | ICD-10-CM | POA: Diagnosis not present

## 2021-08-17 DIAGNOSIS — J690 Pneumonitis due to inhalation of food and vomit: Secondary | ICD-10-CM | POA: Diagnosis not present

## 2021-08-17 DIAGNOSIS — J9621 Acute and chronic respiratory failure with hypoxia: Secondary | ICD-10-CM | POA: Diagnosis not present

## 2021-08-17 DIAGNOSIS — J189 Pneumonia, unspecified organism: Secondary | ICD-10-CM | POA: Diagnosis not present

## 2021-08-17 DIAGNOSIS — N390 Urinary tract infection, site not specified: Secondary | ICD-10-CM | POA: Diagnosis not present

## 2021-08-18 DIAGNOSIS — J189 Pneumonia, unspecified organism: Secondary | ICD-10-CM | POA: Diagnosis not present

## 2021-08-18 DIAGNOSIS — B961 Klebsiella pneumoniae [K. pneumoniae] as the cause of diseases classified elsewhere: Secondary | ICD-10-CM | POA: Diagnosis not present

## 2021-08-18 DIAGNOSIS — J690 Pneumonitis due to inhalation of food and vomit: Secondary | ICD-10-CM | POA: Diagnosis not present

## 2021-08-18 DIAGNOSIS — J9621 Acute and chronic respiratory failure with hypoxia: Secondary | ICD-10-CM | POA: Diagnosis not present

## 2021-08-18 DIAGNOSIS — R1310 Dysphagia, unspecified: Secondary | ICD-10-CM | POA: Diagnosis not present

## 2021-08-18 DIAGNOSIS — N390 Urinary tract infection, site not specified: Secondary | ICD-10-CM | POA: Diagnosis not present

## 2021-08-19 DIAGNOSIS — J189 Pneumonia, unspecified organism: Secondary | ICD-10-CM | POA: Diagnosis not present

## 2021-08-19 DIAGNOSIS — J690 Pneumonitis due to inhalation of food and vomit: Secondary | ICD-10-CM | POA: Diagnosis not present

## 2021-08-19 DIAGNOSIS — N390 Urinary tract infection, site not specified: Secondary | ICD-10-CM | POA: Diagnosis not present

## 2021-08-19 DIAGNOSIS — J9621 Acute and chronic respiratory failure with hypoxia: Secondary | ICD-10-CM | POA: Diagnosis not present

## 2021-08-19 DIAGNOSIS — B961 Klebsiella pneumoniae [K. pneumoniae] as the cause of diseases classified elsewhere: Secondary | ICD-10-CM | POA: Diagnosis not present

## 2021-08-19 DIAGNOSIS — R1310 Dysphagia, unspecified: Secondary | ICD-10-CM | POA: Diagnosis not present

## 2021-08-24 DIAGNOSIS — J189 Pneumonia, unspecified organism: Secondary | ICD-10-CM | POA: Diagnosis not present

## 2021-08-24 DIAGNOSIS — B961 Klebsiella pneumoniae [K. pneumoniae] as the cause of diseases classified elsewhere: Secondary | ICD-10-CM | POA: Diagnosis not present

## 2021-08-24 DIAGNOSIS — J690 Pneumonitis due to inhalation of food and vomit: Secondary | ICD-10-CM | POA: Diagnosis not present

## 2021-08-24 DIAGNOSIS — N390 Urinary tract infection, site not specified: Secondary | ICD-10-CM | POA: Diagnosis not present

## 2021-08-24 DIAGNOSIS — R1310 Dysphagia, unspecified: Secondary | ICD-10-CM | POA: Diagnosis not present

## 2021-08-24 DIAGNOSIS — J9621 Acute and chronic respiratory failure with hypoxia: Secondary | ICD-10-CM | POA: Diagnosis not present

## 2021-08-26 DIAGNOSIS — J9621 Acute and chronic respiratory failure with hypoxia: Secondary | ICD-10-CM | POA: Diagnosis not present

## 2021-08-26 DIAGNOSIS — B961 Klebsiella pneumoniae [K. pneumoniae] as the cause of diseases classified elsewhere: Secondary | ICD-10-CM | POA: Diagnosis not present

## 2021-08-26 DIAGNOSIS — N390 Urinary tract infection, site not specified: Secondary | ICD-10-CM | POA: Diagnosis not present

## 2021-08-26 DIAGNOSIS — R1310 Dysphagia, unspecified: Secondary | ICD-10-CM | POA: Diagnosis not present

## 2021-08-26 DIAGNOSIS — J690 Pneumonitis due to inhalation of food and vomit: Secondary | ICD-10-CM | POA: Diagnosis not present

## 2021-08-26 DIAGNOSIS — J189 Pneumonia, unspecified organism: Secondary | ICD-10-CM | POA: Diagnosis not present

## 2022-03-29 DEATH — deceased
